# Patient Record
Sex: Female | Born: 1945 | Race: White | Hispanic: No | Smoking: Former smoker
Health system: Southern US, Community
[De-identification: ages and names within clinical notes are randomized; demographics above are authoritative.]

## PROBLEM LIST (undated history)

## (undated) DIAGNOSIS — C801 Malignant (primary) neoplasm, unspecified: Secondary | ICD-10-CM

## (undated) HISTORY — PX: TUBAL LIGATION: SHX77

---

## 2009-05-26 ENCOUNTER — Encounter: Admission: RE | Admit: 2009-05-26 | Discharge: 2009-05-26 | Payer: Self-pay | Admitting: Chiropractic Medicine

## 2013-12-01 ENCOUNTER — Other Ambulatory Visit: Payer: Self-pay | Admitting: Internal Medicine

## 2013-12-01 ENCOUNTER — Ambulatory Visit
Admission: RE | Admit: 2013-12-01 | Discharge: 2013-12-01 | Disposition: A | Discharge status: Home / Self Care | Payer: BC Managed Care – PPO | Source: Ambulatory Visit | Attending: Internal Medicine | Admitting: Internal Medicine

## 2013-12-01 DIAGNOSIS — R071 Chest pain on breathing: Secondary | ICD-10-CM

## 2013-12-25 ENCOUNTER — Other Ambulatory Visit: Payer: Self-pay | Admitting: Internal Medicine

## 2013-12-25 ENCOUNTER — Ambulatory Visit
Admission: RE | Admit: 2013-12-25 | Discharge: 2013-12-25 | Disposition: A | Discharge status: Home / Self Care | Payer: BC Managed Care – PPO | Source: Ambulatory Visit | Attending: Internal Medicine | Admitting: Internal Medicine

## 2013-12-25 DIAGNOSIS — R9389 Abnormal findings on diagnostic imaging of other specified body structures: Secondary | ICD-10-CM

## 2019-08-15 DIAGNOSIS — U071 COVID-19: Secondary | ICD-10-CM

## 2019-08-15 HISTORY — DX: COVID-19: U07.1

## 2019-12-12 ENCOUNTER — Other Ambulatory Visit: Payer: Self-pay | Admitting: Nurse Practitioner

## 2019-12-12 ENCOUNTER — Ambulatory Visit
Admission: RE | Admit: 2019-12-12 | Discharge: 2019-12-12 | Disposition: A | Discharge status: Home / Self Care | Payer: BC Managed Care – PPO | Source: Ambulatory Visit | Attending: Nurse Practitioner | Admitting: Nurse Practitioner

## 2019-12-12 DIAGNOSIS — R0602 Shortness of breath: Secondary | ICD-10-CM

## 2019-12-15 ENCOUNTER — Inpatient Hospital Stay (HOSPITAL_COMMUNITY)
Admission: EM | Admit: 2019-12-15 | Discharge: 2019-12-20 | DRG: 357 | Disposition: A | Discharge status: Home / Self Care | Payer: BC Managed Care – PPO | Attending: Internal Medicine | Admitting: Internal Medicine

## 2019-12-15 ENCOUNTER — Other Ambulatory Visit: Payer: Self-pay

## 2019-12-15 ENCOUNTER — Encounter (HOSPITAL_COMMUNITY): Payer: Self-pay

## 2019-12-15 DIAGNOSIS — Z87891 Personal history of nicotine dependence: Secondary | ICD-10-CM

## 2019-12-15 DIAGNOSIS — Z91011 Allergy to milk products: Secondary | ICD-10-CM

## 2019-12-15 DIAGNOSIS — Z8616 Personal history of COVID-19: Secondary | ICD-10-CM

## 2019-12-15 DIAGNOSIS — C799 Secondary malignant neoplasm of unspecified site: Secondary | ICD-10-CM | POA: Diagnosis present

## 2019-12-15 DIAGNOSIS — Z807 Family history of other malignant neoplasms of lymphoid, hematopoietic and related tissues: Secondary | ICD-10-CM

## 2019-12-15 DIAGNOSIS — D649 Anemia, unspecified: Secondary | ICD-10-CM | POA: Diagnosis present

## 2019-12-15 DIAGNOSIS — R634 Abnormal weight loss: Secondary | ICD-10-CM | POA: Diagnosis present

## 2019-12-15 DIAGNOSIS — I10 Essential (primary) hypertension: Secondary | ICD-10-CM | POA: Diagnosis present

## 2019-12-15 DIAGNOSIS — K7460 Unspecified cirrhosis of liver: Secondary | ICD-10-CM | POA: Diagnosis present

## 2019-12-15 DIAGNOSIS — R112 Nausea with vomiting, unspecified: Secondary | ICD-10-CM | POA: Diagnosis present

## 2019-12-15 DIAGNOSIS — R195 Other fecal abnormalities: Secondary | ICD-10-CM | POA: Insufficient documentation

## 2019-12-15 DIAGNOSIS — D62 Acute posthemorrhagic anemia: Secondary | ICD-10-CM | POA: Diagnosis present

## 2019-12-15 DIAGNOSIS — C787 Secondary malignant neoplasm of liver and intrahepatic bile duct: Secondary | ICD-10-CM | POA: Diagnosis present

## 2019-12-15 DIAGNOSIS — E669 Obesity, unspecified: Secondary | ICD-10-CM | POA: Diagnosis present

## 2019-12-15 DIAGNOSIS — Z888 Allergy status to other drugs, medicaments and biological substances status: Secondary | ICD-10-CM

## 2019-12-15 DIAGNOSIS — K449 Diaphragmatic hernia without obstruction or gangrene: Secondary | ICD-10-CM | POA: Diagnosis present

## 2019-12-15 DIAGNOSIS — D509 Iron deficiency anemia, unspecified: Secondary | ICD-10-CM | POA: Diagnosis present

## 2019-12-15 DIAGNOSIS — C182 Malignant neoplasm of ascending colon: Secondary | ICD-10-CM | POA: Diagnosis not present

## 2019-12-15 DIAGNOSIS — R600 Localized edema: Secondary | ICD-10-CM | POA: Diagnosis present

## 2019-12-15 DIAGNOSIS — R030 Elevated blood-pressure reading, without diagnosis of hypertension: Secondary | ICD-10-CM | POA: Diagnosis present

## 2019-12-15 DIAGNOSIS — Z419 Encounter for procedure for purposes other than remedying health state, unspecified: Secondary | ICD-10-CM

## 2019-12-15 DIAGNOSIS — K922 Gastrointestinal hemorrhage, unspecified: Secondary | ICD-10-CM | POA: Diagnosis present

## 2019-12-15 DIAGNOSIS — G4700 Insomnia, unspecified: Secondary | ICD-10-CM | POA: Diagnosis present

## 2019-12-15 DIAGNOSIS — Z91018 Allergy to other foods: Secondary | ICD-10-CM

## 2019-12-15 DIAGNOSIS — Z880 Allergy status to penicillin: Secondary | ICD-10-CM

## 2019-12-15 DIAGNOSIS — I4510 Unspecified right bundle-branch block: Secondary | ICD-10-CM | POA: Diagnosis present

## 2019-12-15 DIAGNOSIS — R9431 Abnormal electrocardiogram [ECG] [EKG]: Secondary | ICD-10-CM | POA: Diagnosis present

## 2019-12-15 DIAGNOSIS — Z79899 Other long term (current) drug therapy: Secondary | ICD-10-CM

## 2019-12-15 DIAGNOSIS — Z95828 Presence of other vascular implants and grafts: Secondary | ICD-10-CM

## 2019-12-15 DIAGNOSIS — Z7982 Long term (current) use of aspirin: Secondary | ICD-10-CM

## 2019-12-15 DIAGNOSIS — Z882 Allergy status to sulfonamides status: Secondary | ICD-10-CM

## 2019-12-15 DIAGNOSIS — K6389 Other specified diseases of intestine: Secondary | ICD-10-CM

## 2019-12-15 DIAGNOSIS — Z6827 Body mass index (BMI) 27.0-27.9, adult: Secondary | ICD-10-CM

## 2019-12-15 DIAGNOSIS — D72829 Elevated white blood cell count, unspecified: Secondary | ICD-10-CM | POA: Diagnosis present

## 2019-12-15 DIAGNOSIS — K648 Other hemorrhoids: Secondary | ICD-10-CM | POA: Diagnosis present

## 2019-12-15 DIAGNOSIS — E876 Hypokalemia: Secondary | ICD-10-CM | POA: Diagnosis present

## 2019-12-15 DIAGNOSIS — J8410 Pulmonary fibrosis, unspecified: Secondary | ICD-10-CM | POA: Diagnosis present

## 2019-12-15 DIAGNOSIS — Z20822 Contact with and (suspected) exposure to covid-19: Secondary | ICD-10-CM | POA: Diagnosis present

## 2019-12-15 LAB — LIPASE, BLOOD: Lipase: 40 U/L (ref 11–51)

## 2019-12-15 LAB — COMPREHENSIVE METABOLIC PANEL
ALT: 31 U/L (ref 0–44)
AST: 44 U/L — ABNORMAL HIGH (ref 15–41)
Albumin: 3 g/dL — ABNORMAL LOW (ref 3.5–5.0)
Alkaline Phosphatase: 192 U/L — ABNORMAL HIGH (ref 38–126)
Anion gap: 13 (ref 5–15)
BUN: 26 mg/dL — ABNORMAL HIGH (ref 8–23)
CO2: 18 mmol/L — ABNORMAL LOW (ref 22–32)
Calcium: 8.7 mg/dL — ABNORMAL LOW (ref 8.9–10.3)
Chloride: 103 mmol/L (ref 98–111)
Creatinine, Ser: 1.22 mg/dL — ABNORMAL HIGH (ref 0.44–1.00)
GFR calc Af Amer: 51 mL/min — ABNORMAL LOW (ref >60)
GFR calc non Af Amer: 44 mL/min — ABNORMAL LOW (ref >60)
Glucose, Bld: 102 mg/dL — ABNORMAL HIGH (ref 70–99)
Potassium: 3.3 mmol/L — ABNORMAL LOW (ref 3.5–5.1)
Sodium: 134 mmol/L — ABNORMAL LOW (ref 135–145)
Total Bilirubin: 0.5 mg/dL (ref 0.3–1.2)
Total Protein: 6.7 g/dL (ref 6.5–8.1)

## 2019-12-15 LAB — ABO/RH: ABO/RH(D): A POS

## 2019-12-15 LAB — CBC
HCT: 20.5 % — ABNORMAL LOW (ref 36.0–46.0)
Hemoglobin: 5.4 g/dL — CL (ref 12.0–15.0)
MCH: 17.5 pg — ABNORMAL LOW (ref 26.0–34.0)
MCHC: 26.3 g/dL — ABNORMAL LOW (ref 30.0–36.0)
MCV: 66.3 fL — ABNORMAL LOW (ref 80.0–100.0)
Platelets: 512 10*3/uL — ABNORMAL HIGH (ref 150–400)
RBC: 3.09 MIL/uL — ABNORMAL LOW (ref 3.87–5.11)
RDW: 19.8 % — ABNORMAL HIGH (ref 11.5–15.5)
WBC: 13.1 10*3/uL — ABNORMAL HIGH (ref 4.0–10.5)
nRBC: 0.2 % (ref 0.0–0.2)

## 2019-12-15 LAB — RESPIRATORY PANEL BY RT PCR (FLU A&B, COVID)
Influenza A by PCR: NEGATIVE
Influenza B by PCR: NEGATIVE
SARS Coronavirus 2 by RT PCR: NEGATIVE

## 2019-12-15 LAB — POC OCCULT BLOOD, ED: Fecal Occult Bld: POSITIVE — AB

## 2019-12-15 MED ORDER — PANTOPRAZOLE SODIUM 40 MG IV SOLR
40.0000 mg | Freq: Two times a day (BID) | INTRAVENOUS | Status: DC
  Administered 2019-12-15 – 2019-12-16 (×2): 40 mg via INTRAVENOUS
  Filled 2019-12-15 (×2): qty 40

## 2019-12-15 MED ORDER — ACETAMINOPHEN 650 MG RE SUPP: 650.0000 mg | Freq: Four times a day (QID) | RECTAL | Status: DC | PRN

## 2019-12-15 MED ORDER — ACETAMINOPHEN 325 MG PO TABS: 650.0000 mg | ORAL_TABLET | Freq: Four times a day (QID) | ORAL | Status: DC | PRN

## 2019-12-15 MED ORDER — SODIUM CHLORIDE 0.9% IV SOLUTION: Freq: Once | INTRAVENOUS | Status: AC

## 2019-12-15 MED ORDER — ONDANSETRON HCL 4 MG PO TABS: 4.0000 mg | ORAL_TABLET | Freq: Four times a day (QID) | ORAL | Status: DC | PRN

## 2019-12-15 MED ORDER — ONDANSETRON HCL 4 MG/2ML IJ SOLN
4.0000 mg | Freq: Four times a day (QID) | INTRAMUSCULAR | Status: DC | PRN
  Administered 2019-12-17 – 2019-12-18 (×2): 4 mg via INTRAVENOUS
  Filled 2019-12-15 (×2): qty 2

## 2019-12-15 NOTE — ED Provider Notes (Signed)
[facility] Provider Note   CSN: [id number] Arrival date & time: 12/15/19  1702     History Chief Complaint  Patient presents with  . Anemia  . Weakness    Stacy Burns is a 74 y.o. female.  74 yo F with a cc of weakness.  Patient has felt fatigue since she had the coronavirus in January.  Just had been getting better and so went to see family doctor early this week.  She had blood work drawn and then was called back today and told that she had a hemoglobin of 5.  The patient is unsure if she had any blood in her stools does not typically look at her stools.  Denies any other areas of bleeding.  Has had some right-sided abdominal pain that seems to come and go and is positional.  No pain currently.  The history is provided by the patient.  Anemia This is a new problem. The current episode started yesterday. The problem occurs constantly. The problem has not changed since onset.Pertinent negatives include no chest pain, no abdominal pain, no headaches and no shortness of breath. Nothing aggravates the symptoms. Nothing relieves the symptoms. She has tried nothing for the symptoms. The treatment provided no relief.  Weakness Associated symptoms: no abdominal pain, no arthralgias, no chest pain, no dizziness, no dysuria, no fever, no headaches, no myalgias, no nausea, no shortness of breath, no urgency and no vomiting        History reviewed. No pertinent past medical history.  Patient Active Problem List   Diagnosis Date Noted  . Symptomatic anemia 12/15/2019  . Hypokalemia 12/15/2019    Past Surgical History:  Procedure Laterality Date  . TUBAL LIGATION       OB History   No obstetric history on file.     No family history on file.  Social History   Tobacco Use  . Smoking status: Not on file  Substance Use Topics  . Alcohol use: Not on file  . Drug use: Not on file    Home Medications Prior to Admission medications     Medication Sig Start Date End Date Taking? Authorizing Provider  acetaminophen (TYLENOL) 325 MG tablet Take 325 mg by mouth every 6 (six) hours as needed for mild pain or headache.   Yes [provider]  aspirin 81 MG chewable tablet Chew 81 mg by mouth every 3 (three) days.   Yes [provider]  albuterol (VENTOLIN HFA) 108 (90 Base) MCG/ACT inhaler Inhale 1-2 puffs into the lungs See admin instructions. Inhale 1-2 puffs into the lungs every four to six hours as needed for shortness of breath or wheezing 12/12/19   [provider]  SPIRIVA RESPIMAT 2.5 MCG/ACT AERS Inhale 2 puffs into the lungs daily.  12/12/19   [provider]    Allergies    Milk-related compounds, Penicillins, Pineapple, Chocolate, Other, and Sulfa antibiotics  Review of Systems   Review of Systems  Constitutional: Negative for chills and fever.  HENT: Negative for congestion and rhinorrhea.   Eyes: Negative for redness and visual disturbance.  Respiratory: Negative for shortness of breath and wheezing.   Cardiovascular: Negative for chest pain and palpitations.  Gastrointestinal: Negative for abdominal pain, nausea and vomiting.  Genitourinary: Negative for dysuria and urgency.  Musculoskeletal: Negative for arthralgias and myalgias.  Skin: Negative for pallor and wound.  Neurological: Positive for weakness. Negative for dizziness and headaches.    Physical Exam  Updated Vital Signs BP (!) 165/66   Pulse 97   Temp 98.3 F (36.8 C) (Oral)   Resp (!) 22   Ht 5\' 7"  (1.702 m)   Wt 82.6 kg   SpO2 100%   BMI 28.51 kg/m   Physical Exam Vitals and nursing note reviewed.  Constitutional:      General: She is not in acute distress.    Appearance: She is well-developed. She is not diaphoretic.     Comments: Conjunctival pallor  HENT:     Head: Normocephalic and atraumatic.  Eyes:     Pupils: Pupils are equal, round, and reactive to light.  Cardiovascular:     Rate and  Rhythm: Regular rhythm. Tachycardia present.     Heart sounds: No murmur. No friction rub. No gallop.   Pulmonary:     Effort: Pulmonary effort is normal.     Breath sounds: No wheezing or rales.  Abdominal:     General: There is no distension.     Palpations: Abdomen is soft.     Tenderness: There is no abdominal tenderness.  Genitourinary:    Comments: Soft Radilla stool Musculoskeletal:        General: No tenderness.     Cervical back: Normal range of motion and neck supple.  Skin:    General: Skin is warm and dry.  Neurological:     Mental Status: She is alert and oriented to person, place, and time.  Psychiatric:        Behavior: Behavior normal.     ED Results / Procedures / Treatments   Labs (all labs ordered are listed, but only abnormal results are displayed) Labs Reviewed  COMPREHENSIVE METABOLIC PANEL - Abnormal; Notable for the following components:      Result Value   Sodium 134 (*)    Potassium 3.3 (*)    CO2 18 (*)    Glucose, Bld 102 (*)    BUN 26 (*)    Creatinine, Ser 1.22 (*)    Calcium 8.7 (*)    Albumin 3.0 (*)    AST 44 (*)    Alkaline Phosphatase 192 (*)    GFR calc non Af Amer 44 (*)    GFR calc Af Amer 51 (*)    All other components within normal limits  CBC - Abnormal; Notable for the following components:   WBC 13.1 (*)    RBC 3.09 (*)    Hemoglobin 5.4 (*)    HCT 20.5 (*)    MCV 66.3 (*)    MCH 17.5 (*)    MCHC 26.3 (*)    RDW 19.8 (*)    Platelets 512 (*)    All other components within normal limits  POC OCCULT BLOOD, ED - Abnormal; Notable for the following components:   Fecal Occult Bld POSITIVE (*)    All other components within normal limits  RESPIRATORY PANEL BY RT PCR (FLU A&B, COVID)  LIPASE, BLOOD  PROTIME-INR  APTT  PHOSPHORUS  MAGNESIUM  CBC WITH DIFFERENTIAL/PLATELET  COMPREHENSIVE METABOLIC PANEL  VITAMIN [id number]  FOLATE  IRON AND TIBC  FERRITIN  RETICULOCYTES  TYPE AND SCREEN  ABO/RH  PREPARE RBC (CROSSMATCH)      EKG None  Radiology No results found.  Procedures Procedures (including critical care time)  Medications Ordered in ED Medications  ondansetron (ZOFRAN) tablet 4 mg (has no administration in time range)    Or  ondansetron (ZOFRAN) injection 4 mg (has no administration in time range)  acetaminophen (TYLENOL) tablet 650 mg (has no administration in time range)    Or  acetaminophen (TYLENOL) suppository 650 mg (has no administration in time range)  pantoprazole (PROTONIX) injection 40 mg (40 mg Intravenous Given 12/15/19 2143)  0.9 %  sodium chloride infusion (Manually program via Guardrails IV Fluids) ( Intravenous New Bag/Given 12/15/19 2124)    ED Course  I have reviewed the triage vital signs and the nursing notes.  Pertinent labs & imaging results that were available during my care of the patient were reviewed by me and considered in my medical decision making (see chart for details).    MDM Rules/Calculators/A&P                      74 yo F with a chief complaint of generalized fatigue.  This been going on for months now.  Found to have a hemoglobin of 5 with an elevation.  Repeated here is 5.6.  Rectal exam without obvious bleeding.  Will send fecal occult.  Give 2 units of blood.  Discussed with hospitalist for admission.  CRITICAL CARE Performed by: Cecilio Asper   Total critical care time: 35 minutes  Critical care time was exclusive of separately billable procedures and treating other patients.  Critical care was necessary to treat or prevent imminent or life-threatening deterioration.  Critical care was time spent personally by me on the following activities: development of treatment plan with patient and/or surrogate as well as nursing, discussions with consultants, evaluation of patient's response to treatment, examination of patient, obtaining history from patient or surrogate, ordering and performing treatments and interventions, ordering and review of  laboratory studies, ordering and review of radiographic studies, pulse oximetry and re-evaluation of patient's condition.  The patients results and plan were reviewed and discussed.   Any x-rays performed were independently reviewed by myself.   Differential diagnosis were considered with the presenting HPI.  Medications  ondansetron (ZOFRAN) tablet 4 mg (has no administration in time range)    Or  ondansetron (ZOFRAN) injection 4 mg (has no administration in time range)  acetaminophen (TYLENOL) tablet 650 mg (has no administration in time range)    Or  acetaminophen (TYLENOL) suppository 650 mg (has no administration in time range)  pantoprazole (PROTONIX) injection 40 mg (40 mg Intravenous Given 12/15/19 2143)  0.9 %  sodium chloride infusion (Manually program via Guardrails IV Fluids) ( Intravenous New Bag/Given 12/15/19 2124)    Vitals:   12/15/19 2121 12/15/19 2122 12/15/19 2137 12/15/19 2200  BP: (!) 167/66 (!) 167/66 (!) 154/70 (!) 165/66  Pulse: 81 82 86 97  Resp: 17 20 16  (!) 22  Temp:  98.4 F (36.9 C) 98.3 F (36.8 C)   TempSrc:  Oral Oral   SpO2: 100%  100% 100%  Weight:      Height:        Final diagnoses:  Symptomatic anemia    Admission/ observation were discussed with the admitting physician, patient and/or family and they are comfortable with the plan.   Final Clinical Impression(s) / ED Diagnoses Final diagnoses:  Symptomatic anemia    Rx / DC Orders ED Discharge Orders    None       Deno Etienne, DO 12/15/19 2210

## 2019-12-15 NOTE — H&P (Addendum)
History and Physical    Stacy Burns [id number] DOB: 1946/04/07 DOA: 12/15/2019  PCP: Ferd Hibbs, NP   Patient coming from: Home.  I have personally briefly reviewed patient's old medical records in [place]  Chief Complaint: Low blood count.  HPI: Stacy Burns is a 74 y.o. female with medical history significant for hospitalization in January this year due to COVID-19 infection who went to see her PCP today due to persistent weakness and dyspnea since January following her coronavirus illness and is being referred from her PCP office to the ED due to having a hemoglobin of 5.  She has been having intermittent RUQ and epigastric pain, but denies nausea, hematemesis, diarrhea, constipation, melena or hematochezia.  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  She denied pica or somnolence.  She has actually been having insomnia after losing her husband to COVID-19 in February this year.  She denied fever, chills, sore throat, rhinorrhea, wheezing or hemoptysis.  She denied chest pain, palpitations, diaphoresis, PND, orthopnea or recent pitting edema of the lower extremities.  However, states that she frequently had bilateral lower extremity edema before losing weight post COVID-19 infection.  Denied dysuria, frequency or hematuria.  No polyuria, polydipsia, polyphagia or blurred vision.  ED Course: Initial vital signs were temperature 98.6 F, pulse 105, respiration 19, blood pressure 133/45 mmHg and O2 sat 100% on room air.  A 2 PRBC unit transfusion was started in the emergency department.  CBC shows white count of 13.1, hemoglobin 5.4 g/dL and platelets 512.  Lipase was 40.  Sodium is 134, potassium 3.3, chloride 103 and CO2 18 mmol/L.  Anion gap was 13.  Lucas 102, BUN 26 and creatinine 1.22 mg/dL.  Total protein 6.7, albumin 3.0 g/dL.  AST was 44, ALT 31 alkaline phosphatase 192.  Normal bilirubin.  Fecal occult blood was positive.  Her chest radiograph from 3 days ago  showed a calcified granuloma in the left lung base, but did not show any active cardiopulmonary disease.  Review of Systems: As per HPI otherwise all other systems reviewed and are negative.  History reviewed. No pertinent past medical history.  Past Surgical History:  Procedure Laterality Date  . TUBAL LIGATION      Social History  reports that she quit smoking about 35 years ago. Her smoking use included cigarettes. She quit after 25.00 years of use. She has never used smokeless tobacco. She reports previous alcohol use. No history on file for drug.  Allergies  Allergen Reactions  . Milk-Related Compounds Nausea Only and Other (See Comments)    Delayed reaction if too much consumed   . Penicillins Anaphylaxis and Other (See Comments)    Both parents had anaphylactic reactions to this  . Pineapple Swelling and Other (See Comments)    Tongue swells and causes acid bumps there, also  . Chocolate Other (See Comments)    Nasal congestion  . Other Itching, Rash and Other (See Comments)    "Most all antibiotics and OTC cold meds break me out" Allergic to ALL NUTS, also = Itching  . Sulfa Antibiotics Rash and Other (See Comments)    Severe rash    Family History  Problem Relation Age of Onset  . Multiple myeloma Mother   . Diabetes Mellitus II Mother   . Leukemia Father   . Muscular dystrophy Brother    Prior to Admission medications   Medication Sig Start Date End Date Taking? Authorizing Provider  acetaminophen (TYLENOL) 325 MG tablet Take 325 mg by mouth every 6 (six) hours as needed for mild pain or headache.   Yes [provider]  aspirin 81 MG chewable tablet Chew 81 mg by mouth every 3 (three) days.   Yes [provider]  albuterol (VENTOLIN HFA) 108 (90 Base) MCG/ACT inhaler Inhale 1-2 puffs into the lungs See admin instructions. Inhale 1-2 puffs into the lungs every four to six hours as needed for shortness of breath or wheezing 12/12/19   [provider]  SPIRIVA RESPIMAT 2.5 MCG/ACT AERS Inhale 2 puffs into the lungs daily.  12/12/19   [provider]   Physical Exam: Vitals:   12/15/19 2122 12/15/19 2137 12/15/19 2200 12/15/19 2230  BP: (!) 167/66 (!) 154/70 (!) 165/66 (!) 153/56  Pulse: 82 86 97 90  Resp: 20 16 (!) 22 18  Temp: 98.4 F (36.9 C) 98.3 F (36.8 C)    TempSrc: Oral Oral    SpO2:  100% 100% 100%  Weight:      Height:       Constitutional: NAD, calm, comfortable Eyes: PERRL, lids and conjunctivae are pale. ENMT: Mucous membranes are moist. Posterior pharynx clear of any exudate or lesions. Neck: normal, supple, no masses, no thyromegaly Respiratory: clear to auscultation bilaterally, no wheezing, no crackles. Normal respiratory effort. No accessory muscle use.  Cardiovascular: Regular rate and rhythm, no murmurs / rubs / gallops.  Bilateral lower extremity pitting edema. 2+ pedal pulses. No carotid bruits.  Abdomen: Obese, nondistended.  BS positive.  Soft, positive RUQ and epigastric tenderness, no masses palpated. No hepatosplenomegaly. Bowel sounds positive.  Musculoskeletal: Generalized weakness.  No clubbing / cyanosis. Good ROM, no contractures. Normal muscle tone.  Skin: no rashes, lesions, ulcers on limited dermatological examination. Neurologic: CN 2-12 grossly intact. Sensation intact, DTR normal. Strength 5/5 in all 4.  Psychiatric: Normal judgment and insight. Alert and oriented x 3. Normal mood.   Labs on Admission: I have personally reviewed following labs and imaging studies  CBC: Recent Labs  Lab 12/15/19 1804  WBC 13.1*  HGB 5.4*  HCT 20.5*  MCV 66.3*  PLT 512*    Basic Metabolic Panel: Recent Labs  Lab 12/15/19 1804  NA 134*  K 3.3*  CL 103  CO2 18*  GLUCOSE 102*  BUN 26*  CREATININE 1.22*  CALCIUM 8.7*    GFR: Estimated Creatinine Clearance: 44.7 mL/min (A) (by C-G formula based on SCr of 1.22 mg/dL (H)).  Liver Function Tests: Recent Labs  Lab  12/15/19 1804  AST 44*  ALT 31  ALKPHOS 192*  BILITOT 0.5  PROT 6.7  ALBUMIN 3.0*    Urine analysis: No results found for: COLORURINE, APPEARANCEUR, LABSPEC, PHURINE, GLUCOSEU, HGBUR, BILIRUBINUR, KETONESUR, PROTEINUR, UROBILINOGEN, NITRITE, LEUKOCYTESUR  Radiological Exams on Admission: No results found.  EKG: Independently reviewed. Vent. rate 89 BPM PR interval * ms QRS duration 138 ms QT/QTc 403/491 ms P-R-T axes 76 -49 68 Sinus rhythm RBBB and LAFB  Assessment/Plan Principal Problem:   Symptomatic microcytic anemia Observation/telemetry. Continue PRBC transfusion. Check post transfusion H&H. Start pantoprazole. Will need GI evaluation.  Active Problems:   Positive fecal occult blood test Pantoprazole 40 mg IVP every 12 hours. Consult GI in the morning.    Hypokalemia Follow-up posttransfusion potassium level. Check magnesium and phosphorus level.    Bilateral lower extremity edema Normal CXR. EKG shows RBBB. Check echocardiogram.    Abnormal EKG Check echocardiogram. Full cardiology evaluation as an outpatient.  Elevated blood pressure reading No history of hypertension. Continue trending BP. May need to begin antihypertensive therapy.    Leukocytosis No fever or signs of infection. Likely stress-induced. Follow-up WBC.    Thrombocytosis Secondary to anemia. Follow-up platelet level.   DVT prophylaxis: SCDs. Code Status:   Full code. Family Communication: Disposition Plan:   Patient is from:  Home  Anticipated DC to:  Home.  Anticipated DC date:  12/16/2019.  Anticipated DC barriers: Clinical improvement and GI evaluation. Consults called:   Admission status:  Observation/telemetry.  Severity of Illness:  Moderate severity.  Reubin Milan MD [facility]  How to contact the [facility] Attending or Consulting provider [place] or covering provider during after hours [place], for this patient?   1. Check the care team in [facility]  and look for a) attending/consulting [facility] provider listed and b) the [facility] team listed 2. Log into [email or web address] and use [facility]'s universal password to access. If you do not have the password, please contact the hospital operator. 3. Locate the [facility] provider you are looking for under [facility] and page to a number that you can be directly reached. 4. If you still have difficulty reaching the provider, please page the [facility] (Director on Call) for the Hospitalists listed on amion for assistance.  12/15/2019, 10:42 PM   This document was created using Dragon voice recognition software and may contain some unintended transcription errors.

## 2019-12-15 NOTE — ED Notes (Signed)
ED Provider at bedside. 

## 2019-12-15 NOTE — ED Notes (Signed)
Pt expresses no discomfort, no distress, feeling "very comfortable" at the moment. No needs expressed, VSS.

## 2019-12-15 NOTE — ED Triage Notes (Signed)
Pt reports generalized weakness since January. Pt had COVID in [place], was never hospitalized. Pt sent here due to hgb of 5 at PCP office. Pt denies any blood loss in stools. Intermittent abd pain.

## 2019-12-15 NOTE — ED Notes (Signed)
Pt ambulated to RR with no assistance from staff, NAD noted, gait steady

## 2019-12-16 ENCOUNTER — Encounter (HOSPITAL_COMMUNITY): Payer: Self-pay | Admitting: Internal Medicine

## 2019-12-16 ENCOUNTER — Observation Stay (HOSPITAL_BASED_OUTPATIENT_CLINIC_OR_DEPARTMENT_OTHER): Payer: BC Managed Care – PPO

## 2019-12-16 ENCOUNTER — Observation Stay (HOSPITAL_COMMUNITY): Payer: BC Managed Care – PPO

## 2019-12-16 DIAGNOSIS — D649 Anemia, unspecified: Secondary | ICD-10-CM

## 2019-12-16 DIAGNOSIS — R9431 Abnormal electrocardiogram [ECG] [EKG]: Secondary | ICD-10-CM

## 2019-12-16 DIAGNOSIS — R748 Abnormal levels of other serum enzymes: Secondary | ICD-10-CM | POA: Diagnosis not present

## 2019-12-16 DIAGNOSIS — D72829 Elevated white blood cell count, unspecified: Secondary | ICD-10-CM | POA: Diagnosis not present

## 2019-12-16 DIAGNOSIS — R195 Other fecal abnormalities: Secondary | ICD-10-CM | POA: Diagnosis not present

## 2019-12-16 LAB — COMPREHENSIVE METABOLIC PANEL
ALT: 28 U/L (ref 0–44)
AST: 41 U/L (ref 15–41)
Albumin: 2.6 g/dL — ABNORMAL LOW (ref 3.5–5.0)
Alkaline Phosphatase: 168 U/L — ABNORMAL HIGH (ref 38–126)
Anion gap: 13 (ref 5–15)
BUN: 20 mg/dL (ref 8–23)
CO2: 19 mmol/L — ABNORMAL LOW (ref 22–32)
Calcium: 8.3 mg/dL — ABNORMAL LOW (ref 8.9–10.3)
Chloride: 105 mmol/L (ref 98–111)
Creatinine, Ser: 0.97 mg/dL (ref 0.44–1.00)
GFR calc Af Amer: >60 mL/min (ref >60)
GFR calc non Af Amer: 58 mL/min — ABNORMAL LOW (ref >60)
Glucose, Bld: 93 mg/dL (ref 70–99)
Potassium: 3.3 mmol/L — ABNORMAL LOW (ref 3.5–5.1)
Sodium: 137 mmol/L (ref 135–145)
Total Bilirubin: 1.1 mg/dL (ref 0.3–1.2)
Total Protein: 5.7 g/dL — ABNORMAL LOW (ref 6.5–8.1)

## 2019-12-16 LAB — CBC WITH DIFFERENTIAL/PLATELET
Abs Immature Granulocytes: 0 10*3/uL (ref 0.00–0.07)
Basophils Absolute: 0.1 10*3/uL (ref 0.0–0.1)
Basophils Relative: 1 %
Eosinophils Absolute: 0.4 10*3/uL (ref 0.0–0.5)
Eosinophils Relative: 4 %
HCT: 24.7 % — ABNORMAL LOW (ref 36.0–46.0)
Hemoglobin: 7.3 g/dL — ABNORMAL LOW (ref 12.0–15.0)
Lymphocytes Relative: 13 %
Lymphs Abs: 1.5 10*3/uL (ref 0.7–4.0)
MCH: 21 pg — ABNORMAL LOW (ref 26.0–34.0)
MCHC: 29.6 g/dL — ABNORMAL LOW (ref 30.0–36.0)
MCV: 71.2 fL — ABNORMAL LOW (ref 80.0–100.0)
Monocytes Absolute: 0.3 10*3/uL (ref 0.1–1.0)
Monocytes Relative: 3 %
Neutro Abs: 8.8 10*3/uL — ABNORMAL HIGH (ref 1.7–7.7)
Neutrophils Relative %: 79 %
Platelets: 494 10*3/uL — ABNORMAL HIGH (ref 150–400)
RBC: 3.47 MIL/uL — ABNORMAL LOW (ref 3.87–5.11)
RDW: 23.9 % — ABNORMAL HIGH (ref 11.5–15.5)
WBC: 11.2 10*3/uL — ABNORMAL HIGH (ref 4.0–10.5)
nRBC: 0 /100 WBC
nRBC: 0.2 % (ref 0.0–0.2)

## 2019-12-16 LAB — HEMOGLOBIN AND HEMATOCRIT, BLOOD
HCT: 26.9 % — ABNORMAL LOW (ref 36.0–46.0)
Hemoglobin: 7.7 g/dL — ABNORMAL LOW (ref 12.0–15.0)

## 2019-12-16 LAB — ECHOCARDIOGRAM COMPLETE
Height: 67 in
Weight: 2912 oz

## 2019-12-16 MED ORDER — KCL-LACTATED RINGERS-D5W 20 MEQ/L IV SOLN
INTRAVENOUS | Status: AC
  Filled 2019-12-16 (×4): qty 1000

## 2019-12-16 MED ORDER — PEG-KCL-NACL-NASULF-NA ASC-C 100 G PO SOLR
1.0000 | Freq: Once | ORAL | Status: AC
  Administered 2019-12-17: 18:00:00 200 g via ORAL
  Filled 2019-12-16: qty 1

## 2019-12-16 MED ORDER — METOCLOPRAMIDE HCL 5 MG/ML IJ SOLN
10.0000 mg | Freq: Once | INTRAMUSCULAR | Status: AC
  Administered 2019-12-18: 10 mg via INTRAVENOUS
  Filled 2019-12-16: qty 2

## 2019-12-16 MED ORDER — MAGNESIUM SULFATE 2 GM/50ML IV SOLN
2.0000 g | Freq: Once | INTRAVENOUS | Status: AC
  Administered 2019-12-16: 2 g via INTRAVENOUS
  Filled 2019-12-16: qty 50

## 2019-12-16 MED ORDER — BISACODYL 5 MG PO TBEC
20.0000 mg | DELAYED_RELEASE_TABLET | Freq: Once | ORAL | Status: AC
  Administered 2019-12-16: 20 mg via ORAL

## 2019-12-16 MED ORDER — POTASSIUM CHLORIDE CRYS ER 20 MEQ PO TBCR
40.0000 meq | EXTENDED_RELEASE_TABLET | Freq: Two times a day (BID) | ORAL | Status: AC
  Administered 2019-12-16 (×2): 40 meq via ORAL
  Filled 2019-12-16 (×2): qty 2

## 2019-12-16 MED ORDER — PANTOPRAZOLE SODIUM 40 MG PO TBEC
40.0000 mg | DELAYED_RELEASE_TABLET | Freq: Every day | ORAL | Status: DC
  Administered 2019-12-17 – 2019-12-20 (×3): 40 mg via ORAL
  Filled 2019-12-16 (×2): qty 1

## 2019-12-16 MED ORDER — IOHEXOL 9 MG/ML PO SOLN
500.0000 mL | ORAL | Status: AC
  Administered 2019-12-16 (×2): 500 mL via ORAL

## 2019-12-16 MED ORDER — IOHEXOL 9 MG/ML PO SOLN
ORAL | Status: AC
  Filled 2019-12-16: qty 500

## 2019-12-16 MED ORDER — BISACODYL 5 MG PO TBEC
5.0000 mg | DELAYED_RELEASE_TABLET | Freq: Once | ORAL | Status: AC
  Administered 2019-12-16: 5 mg via ORAL
  Filled 2019-12-16: qty 1

## 2019-12-16 MED ORDER — METOCLOPRAMIDE HCL 5 MG/ML IJ SOLN
10.0000 mg | Freq: Once | INTRAMUSCULAR | Status: AC
  Administered 2019-12-17: 10 mg via INTRAVENOUS
  Filled 2019-12-16: qty 2

## 2019-12-16 NOTE — Progress Notes (Signed)
Echocardiogram 2D Echocardiogram has been performed.  Stacy Burns Aston 12/16/2019, 9:40 AM

## 2019-12-16 NOTE — ED Notes (Signed)
Lunch Tray Ordered @ 1033. 

## 2019-12-16 NOTE — ED Notes (Signed)
Breakfast Ordered 

## 2019-12-16 NOTE — Progress Notes (Signed)
PROGRESS NOTE  Stacy Burns Burns BPZ:[id number] DOB: 06/20/1946 DOA: 12/15/2019 PCP: Ferd Hibbs, NP  HPI/Recap of past 24 hours: Stacy Burns Burns is a 74 y.o. female with medical history significant for COVID-19 viral infection in January 2021 who presented with worsening dyspnea associated with generalized weakness.  With onset after her diagnosis of Covid-19 in January.  She went to see her PCP on the day of presentation where her hemoglobin was 5.5.  Associative symptoms include intermittent RUQ and epigastric pain.  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  She denied pica or somnolence.  She has actually been having insomnia after losing her husband to COVID-19 in February this year.  She denied fever, chills, sore throat, rhinorrhea, wheezing or hemoptysis.  She denied chest pain, palpitations, diaphoresis, PND, orthopnea or recent pitting edema of the lower extremities.   ED Course:  Hemoglobin 5.4 with positive FOBT.  GI following.  12/16/19: Seen and examined in the ED.  She states she feels better.  Denies hematochezia or melena.  Denies nausea or abdominal pain.    Assessment/Plan: Principal Problem:   Symptomatic anemia Active Problems:   Hypokalemia   Positive fecal occult blood test   Leukocytosis   Bilateral lower extremity edema   Abnormal EKG   Elevated blood pressure reading  Severe symptomatic anemia, rule out GI bleed and malignancy Presented with symptomatic anemia, unintentional weight loss, hemoglobin of 5.5 and positive FOBT GI has been consulted, defer to GI to obtain endoscopy We will obtain CT scan to rule out any intra-abdominal pathology that is visible on CT scan If endoscopy is unrevealing would obtain CT scan abdomen and pelvis with oral contrast Received 2 unit PRBCs, hemoglobin 7.3 posttransfusion, MCV 71 Iron studies ordered and pending. Transfuse hemoglobin less than 7.0 Repeat H&H this p.m. Monitor on telemetry  Dyspnea likely  symptomatic from severe anemia She had a 2D echo done which showed normal LVEF 60-65% Likely not cardiogenic  Leukocytosis likely reactive in the setting of severe anemia Presented with leukocytosis with WBC 13 K Leukocytosis is trending down  Hypokalemia Potassium 3.3 Repleted with oral potassium 40 mEq x 2 doses Added IV magnesium 2 g x1 Repeat BMP in the morning  Elevated alkaline phosphatase Presented with alkaline phosphatase 192, downtrending AST ALT T Bili normal If no significant improvement may consider imaging to evaluate the liver     Code Status: Full code  Family Communication: We will call family if okay with the patient  Disposition Plan: Patient from home.  Anticipate discharge to home in the next 48 to 72 hours or when GI signs off.  Barrier to discharge, ongoing work-up for severe anemia and weight loss.  Consultants:  GI  Procedures:  None  Antimicrobials:  None  DVT prophylaxis: SCD   Objective: Vitals:   12/16/19 1215 12/16/19 1230 12/16/19 1345 12/16/19 1514  BP:    (!) 159/74  Pulse: 77 77 85 89  Resp: _0 Temp:    98.5 F (36.9 C)  TempSrc:    Oral  SpO2: 99% 100% 100% 100%  Weight:      Height:        Intake/Output Summary (Last 24 hours) at 12/16/2019 1545 Last data filed at 12/16/2019 0350 Gross per 24 hour  Intake 630 ml  Output --  Net 630 ml   Filed Weights   12/15/19 1800  Weight: 82.6 kg    Exam:  . General: 74  y.o. year-old female well developed well nourished in no acute distress.  Alert and oriented x3. . Cardiovascular: Regular rate and rhythm with no rubs or gallops.  No thyromegaly or JVD noted.   Marland Kitchen Respiratory: Clear to auscultation with no wheezes or rales. Good inspiratory effort. . Abdomen: Soft nontender nondistended with normal bowel sounds x4 quadrants. . Musculoskeletal: No lower extremity edema. 2/4 pulses in all 4 extremities. Marland Kitchen Psychiatry: Mood is appropriate for condition and  setting   Data Reviewed: CBC: Recent Labs  Lab 12/15/19 1804 12/16/19 0559  WBC 13.1* 11.2*  NEUTROABS  --  8.8*  HGB 5.4* 7.3*  HCT 20.5* 24.7*  MCV 66.3* 71.2*  PLT 512* 076*   Basic Metabolic Panel: Recent Labs  Lab 12/15/19 1804 12/16/19 0559  NA 134* 137  K 3.3* 3.3*  CL 103 105  CO2 18* 19*  GLUCOSE 102* 93  BUN 26* 20  CREATININE 1.22* 0.97  CALCIUM 8.7* 8.3*   GFR: Estimated Creatinine Clearance: 56.2 mL/min (by C-G formula based on SCr of 0.97 mg/dL). Liver Function Tests: Recent Labs  Lab 12/15/19 1804 12/16/19 0559  AST 44* 41  ALT 31 28  ALKPHOS 192* 168*  BILITOT 0.5 1.1  PROT 6.7 5.7*  ALBUMIN 3.0* 2.6*   Recent Labs  Lab 12/15/19 1804  LIPASE 40   No results for input(s): AMMONIA in the last 168 hours. Coagulation Profile: No results for input(s): INR, PROTIME in the last 168 hours. Cardiac Enzymes: No results for input(s): CKTOTAL, CKMB, CKMBINDEX, TROPONINI in the last 168 hours. BNP (last 3 results) No results for input(s): PROBNP in the last 8760 hours. HbA1C: No results for input(s): HGBA1C in the last 72 hours. CBG: No results for input(s): GLUCAP in the last 168 hours. Lipid Profile: No results for input(s): CHOL, HDL, LDLCALC, TRIG, CHOLHDL, LDLDIRECT in the last 72 hours. Thyroid Function Tests: No results for input(s): TSH, T4TOTAL, FREET4, T3FREE, THYROIDAB in the last 72 hours. Anemia Panel: No results for input(s): VITAMINB12, FOLATE, FERRITIN, TIBC, IRON, RETICCTPCT in the last 72 hours. Urine analysis: No results found for: COLORURINE, APPEARANCEUR, LABSPEC, [place], GLUCOSEU, HGBUR, BILIRUBINUR, KETONESUR, PROTEINUR, UROBILINOGEN, NITRITE, LEUKOCYTESUR Sepsis Labs: _0 (procalcitonin:4,lacticidven:4)  ) Recent Results (from the past 240 hour(s))  Respiratory Panel by RT PCR (Flu A&B, Covid) - Nasopharyngeal Swab     Status: None   Collection Time: 12/15/19 10:07 PM   Specimen: Nasopharyngeal Swab  Result  Value Ref Range Status   SARS Coronavirus 2 by RT PCR NEGATIVE NEGATIVE Final    Comment: (NOTE) SARS-CoV-2 target nucleic acids are NOT DETECTED. The SARS-CoV-2 RNA is generally detectable in upper respiratoy specimens during the acute phase of infection. The lowest concentration of SARS-CoV-2 viral copies this assay can detect is 131 copies/mL. A negative result does not preclude SARS-Cov-2 infection and should not be used as the sole basis for treatment or other patient management decisions. A negative result may occur with  improper specimen collection/handling, submission of specimen other than nasopharyngeal swab, presence of viral mutation(s) within the areas targeted by this assay, and inadequate number of viral copies (<131 copies/mL). A negative result must be combined with clinical observations, patient history, and epidemiological information. The expected result is Negative. Fact Sheet for Patients:  PinkCheek.be Fact Sheet for Healthcare Providers:  GravelBags.it This test is not yet ap proved or cleared by the [place] FDA and  has been authorized for detection and/or diagnosis of SARS-CoV-2 by FDA under an Emergency Use Authorization (EUA).  This EUA will remain  in effect (meaning this test can be used) for the duration of the COVID-19 declaration under Section 564(b)(1) of the Act, 21 U.S.C. section 360bbb-3(b)(1), unless the authorization is terminated or revoked sooner.    Influenza A by PCR NEGATIVE NEGATIVE Final   Influenza B by PCR NEGATIVE NEGATIVE Final    Comment: (NOTE) The Xpert Xpress SARS-CoV-2/FLU/RSV assay is intended as an aid in  the diagnosis of influenza from Nasopharyngeal swab specimens and  should not be used as a sole basis for treatment. Nasal washings and  aspirates are unacceptable for Xpert Xpress SARS-CoV-2/FLU/RSV  testing. Fact Sheet for  Patients: PinkCheek.be Fact Sheet for Healthcare Providers: GravelBags.it This test is not yet approved or cleared by the [place] FDA and  has been authorized for detection and/or diagnosis of SARS-CoV-2 by  FDA under an Emergency Use Authorization (EUA). This EUA will remain  in effect (meaning this test can be used) for the duration of the  Covid-19 declaration under Section 564(b)(1) of the Act, 21  U.S.C. section 360bbb-3(b)(1), unless the authorization is  terminated or revoked. Performed at [facility], [place] [address] [place], [place] [address]       Studies: ECHOCARDIOGRAM COMPLETE  Result Date: 12/16/2019    ECHOCARDIOGRAM REPORT   Patient Name:   Stacy Burns Burns Date of Exam: 12/16/2019 Medical Rec #:  [id number]    Height:       67.0 in Accession #:    [id number]   Weight:       182.0 lb Date of Birth:  12-15-1945     BSA:          1.943 m Patient Age:    56 years     BP:           136/68 mmHg Patient Gender: F            HR:           79 bpm. Exam Location:  Inpatient Procedure: 2D Echo, Color Doppler and Cardiac Doppler Indications:    R06.9 DOE  History:        Patient has no prior history of Echocardiogram examinations.                 COVID+ in Jan 2021.  Sonographer:    Raquel Sarna Senior RDCS Referring Phys: [id number] [place]  Sonographer Comments: Suboptimal parasternal window. IMPRESSIONS  1. Left ventricular ejection fraction, by estimation, is 60 to 65%. The left ventricle has normal function. The left ventricle has no regional wall motion abnormalities. There is mild concentric left ventricular hypertrophy. Left ventricular diastolic parameters are indeterminate.  2. Right ventricular systolic function is normal. The right ventricular size is normal.  3. The mitral valve is normal in structure. Trivial mitral valve regurgitation. No evidence of mitral stenosis.  4. The aortic valve was not well visualized.  Aortic valve regurgitation is not visualized. No aortic stenosis is present.  5. The inferior vena cava is dilated in size with >50% respiratory variability, suggesting right atrial pressure of 8 mmHg. Comparison(s): No prior Echocardiogram. Conclusion(s)/Recommendation(s): Normal biventricular function without evidence of hemodynamically significant valvular heart disease. FINDINGS  Left Ventricle: Left ventricular ejection fraction, by estimation, is 60 to 65%. The left ventricle has normal function. The left ventricle has no regional wall motion abnormalities. The left ventricular internal cavity size was normal in size. There is  mild concentric left ventricular hypertrophy. Left ventricular diastolic parameters are  indeterminate. Right Ventricle: The right ventricular size is normal. No increase in right ventricular wall thickness. Right ventricular systolic function is normal. Left Atrium: Left atrial size was normal in size. Right Atrium: Right atrial size was normal in size. Pericardium: There is no evidence of pericardial effusion. Mitral Valve: The mitral valve is normal in structure. Moderate mitral annular calcification. Trivial mitral valve regurgitation. No evidence of mitral valve stenosis. Tricuspid Valve: The tricuspid valve is normal in structure. Tricuspid valve regurgitation is trivial. No evidence of tricuspid stenosis. Aortic Valve: The aortic valve was not well visualized. Aortic valve regurgitation is not visualized. No aortic stenosis is present. Pulmonic Valve: The pulmonic valve was not well visualized. Pulmonic valve regurgitation is not visualized. No evidence of pulmonic stenosis. Aorta: The aortic root was not well visualized and the ascending aorta was not well visualized. Pulmonary Artery: The pulmonary artery is not well seen. Venous: The inferior vena cava is dilated in size with greater than 50% respiratory variability, suggesting right atrial pressure of 8 mmHg. IAS/Shunts: The  atrial septum is grossly normal.  LEFT VENTRICLE PLAX 2D LVIDd:         4.37 cm  Diastology LVIDs:         2.83 cm  LV e' lateral:   9.03 cm/s LV PW:         1.32 cm  LV E/e' lateral: 10.5 LV IVS:        1.25 cm  LV e' medial:    6.96 cm/s LVOT diam:     2.10 cm  LV E/e' medial:  13.6 LV SV:         74 LV SV Index:   38 LVOT Area:     3.46 cm  RIGHT VENTRICLE RV S prime:     12.20 cm/s TAPSE (M-mode): 2.0 cm LEFT ATRIUM             Index       RIGHT ATRIUM           Index LA diam:        3.70 cm 1.90 cm/m  RA Area:     11.70 cm LA Vol (A2C):   61.9 ml 31.86 ml/m RA Volume:   24.00 ml  12.35 ml/m LA Vol (A4C):   51.2 ml 26.35 ml/m LA Biplane Vol: 55.9 ml 28.77 ml/m  AORTIC VALVE LVOT Vmax:   112.00 cm/s LVOT Vmean:  74.300 cm/s LVOT VTI:    0.214 m  AORTA Ao Root diam: 3.60 cm Ao Asc diam:  3.30 cm MITRAL VALVE MV Area (PHT): 2.76 cm    SHUNTS MV Decel Time: 275 msec    Systemic VTI:  0.21 m MV E velocity: 94.60 cm/s  Systemic Diam: 2.10 cm MV A velocity: 97.50 cm/s MV E/A ratio:  0.97 Buford Dresser MD Electronically signed by Buford Dresser MD Signature Date/Time: 12/16/2019/2:19:48 PM    Final     Scheduled Meds: . Derrill Memo ON 12/17/2019] bisacodyl  20 mg Oral Once  . bisacodyl  5 mg Oral Once  . [START ON 12/17/2019] metoCLOPramide (REGLAN) injection  10 mg Intravenous Once   Followed by  . [START ON 12/18/2019] metoCLOPramide (REGLAN) injection  10 mg Intravenous Once  . [START ON 12/17/2019] pantoprazole  40 mg Oral Daily  . [START ON 12/17/2019] peg 3350 powder  1 kit Oral Once  . potassium chloride  40 mEq Oral BID    Continuous Infusions: . dextrose 5% lactated ringers with KCl 20 mEq/L    .  magnesium sulfate bolus IVPB       LOS: 0 days     Kayleen Memos, MD [facility] Pager [phone]  If 7PM-7AM, please contact night-coverage [email or web address] Password [facility] 12/16/2019, 3:45 PM

## 2019-12-16 NOTE — Consult Note (Signed)
[facility] Consult: 1:43 PM 12/16/2019  LOS: 0 days    Referring Provider: Dr Aileen Fass  Primary Care Physician:  Ferd Hibbs, NP in [place] Primary Gastroenterologist:  unassigned    Reason for Consultation:  Microcytic anemia.  FOBT +   HPI: Stacy Burns is a 74 y.o. female.  PMH tubal ligation 30 plus years ago.  No regular medical care.  No prior EGD or colonoscopy. Covid 19 positive in 08/2019, not hospitalized but her husband spent 2 weeks at [facility], discharged and readmitted with CVAs and died in 10-16-19. Since Covid infection, having progressive DOE, fatigue.  Went to a new PMD on Friday, 5 days ago.  Labs drawn.  Call on Monday with results and Hgb ~ 5, sent to hospital for transfusion and is now getting admitted.   She does not observe BM's so color or evidence of blood not noted, but says more episodes of intermittent loose stools, not diarrhea, since January.  No abd pain, N/V, anorexia. Rare Tums for heartburn.  Eats red meat at least 1 x weekly.  No NSAIDs, takes 81 ASA every 3 days (her strategy).  No ETOH.  Possible anemia but no iron or blood transfusion as a young woman.    Hgb 5.4 >> 2 PRBC >> 7.3.  MCV 66.  FOBT + t bili 1.1.  Alk phos 192.  AST/ALT 44/31.   Covid 19 negative 2D echo with LVEF 60 to 65%.  Normal right ventricular systolic function.  No significant valvular disease/regurg.      Family history Mother died with multiple myeloma.  Father died with leukemia.  Social history No EtOH.  Widowed as of 10-16-2019.  She has 2 children, one of her sons has been staying with her at her home since the death of her husband.     Past Medical History:  Diagnosis Date  . COVID-19 virus infection 08/2019    Past Surgical History:  Procedure Laterality Date    . TUBAL LIGATION      Prior to Admission medications   Medication Sig Start Date End Date Taking? Authorizing Provider  acetaminophen (TYLENOL) 325 MG tablet Take 325 mg by mouth every 6 (six) hours as needed for mild pain or headache.   Yes [provider]  aspirin 81 MG chewable tablet Chew 81 mg by mouth every 3 (three) days.   Yes [provider]  albuterol (VENTOLIN HFA) 108 (90 Base) MCG/ACT inhaler Inhale 1-2 puffs into the lungs See admin instructions. Inhale 1-2 puffs into the lungs every four to six hours as needed for shortness of breath or wheezing 12/12/19   [provider]  SPIRIVA RESPIMAT 2.5 MCG/ACT AERS Inhale 2 puffs into the lungs daily.  12/12/19   [provider]    Scheduled Meds: . pantoprazole (PROTONIX) IV  40 mg Intravenous Q12H  . potassium chloride  40 mEq Oral BID   Infusions: . dextrose 5% lactated ringers with KCl 20 mEq/L    . magnesium sulfate bolus IVPB  PRN Meds: acetaminophen **OR** acetaminophen, ondansetron **OR** ondansetron (ZOFRAN) IV   Allergies as of 12/15/2019 - Review Complete 12/15/2019  Allergen Reaction Noted  . Milk-related compounds Nausea Only and Other (See Comments) 12/15/2019  . Penicillins Anaphylaxis and Other (See Comments) 12/15/2019  . Pineapple Swelling and Other (See Comments) 12/15/2019  . Chocolate Other (See Comments) 12/15/2019  . Other Itching, Rash, and Other (See Comments) 12/15/2019  . Sulfa antibiotics Rash and Other (See Comments) 12/15/2019    Family History  Problem Relation Age of Onset  . Multiple myeloma Mother   . Diabetes Mellitus II Mother   . Leukemia Father   . Muscular dystrophy Brother     Social History   Socioeconomic History  . Marital status: Married    Spouse name: Not on file  . Number of children: Not on file  . Years of education: Not on file  . Highest education level: Not on file  Occupational History  . Not on file  Tobacco Use  .  Smoking status: Former Smoker    Years: 25.00    Types: Cigarettes    Quit date: 08/14/1984    Years since quitting: 35.3  . Smokeless tobacco: Never Used  Substance and Sexual Activity  . Alcohol use: Not Currently  . Drug use: Not on file  . Sexual activity: Not on file  Other Topics Concern  . Not on file  Social History Narrative  . Not on file   Social Determinants of Health   Financial Resource Strain:   . Difficulty of Paying Living Expenses:   Food Insecurity:   . Worried About Charity fundraiser in the Last Year:   . Arboriculturist in the Last Year:   Transportation Needs:   . Film/video editor (Medical):   Marland Kitchen Lack of Transportation (Non-Medical):   Physical Activity:   . Days of Exercise per Week:   . Minutes of Exercise per Session:   Stress:   . Feeling of Stress :   Social Connections:   . Frequency of Communication with Friends and Family:   . Frequency of Social Gatherings with Friends and Family:   . Attends Religious Services:   . Active Member of Clubs or Organizations:   . Attends Archivist Meetings:   Marland Kitchen Marital Status:   Intimate Partner Violence:   . Fear of Current or Ex-Partner:   . Emotionally Abused:   Marland Kitchen Physically Abused:   . Sexually Abused:     REVIEW OF SYSTEMS: Constitutional: Weakness, fatigue ENT:  No nasal or oral bleeding Pulm: Shortness of breath, improved since transfusion CV:  No palpitations, no LE edema.  No angina GU:  No hematuria, no frequency.  No dysuria GI: See HPI. Heme: See HPI. Transfusions: Received 2 PRBCs this morning. Neuro:  No headaches, no peripheral tingling or numbness.  No syncope, no seizures. Derm:  No itching, no rash or sores.  Endocrine:  No sweats or chills.  No polyuria or dysuria Immunization: Has not yet been vaccinated for COVID-19. Travel:  None beyond local counties in last few months.    PHYSICAL EXAM: Vital signs in last 24 hours: Vitals:   12/16/19 1215 12/16/19 1230    BP:    Pulse: 77 77  Resp: 20 18  Temp:    SpO2: 99% 100%   Wt Readings from Last 3 Encounters:  12/15/19 82.6 kg    General: Pleasant, nonill appearing.  Looks tired.  Comfortable. Head: No  facial asymmetry or swelling.  No signs of head trauma Eyes: No conjunctival pallor.  No scleral icterus.  EOMI Ears: No hearing deficit. Nose: No congestion or discharge Mouth: Edentulous, not currently wearing her dentures.  Mucosa is pink, moist, clear.  Tongue is midline. Neck: No JVD, no masses, no thyromegaly Lungs: Clear bilaterally without cough or dyspnea. Heart: RRR.  No MRG.  S1, S2 present Abdomen: Soft, not tender, not distended.  Active bowel sounds.  No HSM, bruits, masses, hernias..   Rectal: Did not repeat exam performed by ED staff where the stool was Golz but FOBT positive Musc/Skeltl: No joint redness, swelling or gross deformity Extremities: No CCE Neurologic: Good historian.  Alert.  Oriented x3.  Moves all 4 limbs.  No tremor, no gross deficits. Skin: No telangiectasia, no rash, no sores Tattoos: None observed Nodes: No cervical adenopathy Psych: Pleasant, calm, cooperative.  Fluid speech  Intake/Output from previous day: 05/03 0701 - 05/04 0700 In: 630 [Blood:630] Out: -  Intake/Output this shift: No intake/output data recorded.  LAB RESULTS: Recent Labs    12/15/19 1804 12/16/19 0559  WBC 13.1* 11.2*  HGB 5.4* 7.3*  HCT 20.5* 24.7*  PLT 512* 494*   BMET Lab Results  Component Value Date   NA 137 12/16/2019   NA 134 (L) 12/15/2019   K 3.3 (L) 12/16/2019   K 3.3 (L) 12/15/2019   CL 105 12/16/2019   CL 103 12/15/2019   CO2 19 (L) 12/16/2019   CO2 18 (L) 12/15/2019   GLUCOSE 93 12/16/2019   GLUCOSE 102 (H) 12/15/2019   BUN 20 12/16/2019   BUN 26 (H) 12/15/2019   CREATININE 0.97 12/16/2019   CREATININE 1.22 (H) 12/15/2019   CALCIUM 8.3 (L) 12/16/2019   CALCIUM 8.7 (L) 12/15/2019   LFT Recent Labs    12/15/19 1804 12/16/19 0559  PROT 6.7  5.7*  ALBUMIN 3.0* 2.6*  AST 44* 41  ALT 31 28  ALKPHOS 192* 168*  BILITOT 0.5 1.1   PT/INR No results found for: INR, PROTIME      Component Value Date/Time   LIPASE 40 12/15/2019 1804     RADIOLOGY STUDIES: No results found.    IMPRESSION:   *    Microcytic anemia, symptomatic w fatigue/DOE/weakness.  FOBT+ stool but no history of overt GI bleeding and no significant GI symptomatology. Good response to transfusion, Hgb 5.4 >> 2 PRBCs >> 7.3. Anemia/iron studies not obtained but with microcytosis this is certainly iron deficiency.  *    Elevated alk phos and elevated but now normal AST, otherwise normal elevated LFTs.  *    COVID-19 positive 08/2019.  Did not require hospitalization.  Unfortunately her husband had active Covid infection and ultimately passed away in 09-27-2019. COVID-19 negative currently  *   Hypokalemia 3.3.  Supplements ordered.   PLAN:     *   Needs colonoscopy and probably upper endoscopy as well.  ? do we keep her as an inpt and perform these studies on 5/6, given that the endoscopy scheduled for 5/5 is already fully booked?  The other strategy would be to let her discharge home and set her up for outpatient studies.  *   Does she need CT abdomen pelvis for eval of elevated LFTs?  The greatest concern is that her anemia, FOBT positivity represents a malignancy and alk phos elevation represents liver mets.  *   Until we decide how we will approach the endoscopic studies, keep patient on clear liquids  *  Anemia panel collected pretransfusion, results pdg  *   Discontinued the Protonix 40 IV bid.  We will add empiric Protonix 40 p.o. daily but not absolutely clear she needs this.    Azucena Freed  12/16/2019, 1:43 PM Phone [phone]

## 2019-12-16 NOTE — ED Notes (Signed)
Pt resting comfortably, respirations even & unlabored. VSS, no needs expressed.

## 2019-12-16 NOTE — ED Notes (Signed)
Pt expresses no discomfort or distress starting 2nd unit of RBC. VSS, pt resting comfortably. Blood rate increased to 135mL/hr

## 2019-12-17 ENCOUNTER — Encounter (HOSPITAL_COMMUNITY): Payer: Self-pay | Admitting: Internal Medicine

## 2019-12-17 DIAGNOSIS — R935 Abnormal findings on diagnostic imaging of other abdominal regions, including retroperitoneum: Secondary | ICD-10-CM

## 2019-12-17 DIAGNOSIS — E876 Hypokalemia: Secondary | ICD-10-CM | POA: Diagnosis present

## 2019-12-17 DIAGNOSIS — D72829 Elevated white blood cell count, unspecified: Secondary | ICD-10-CM | POA: Diagnosis present

## 2019-12-17 DIAGNOSIS — D490 Neoplasm of unspecified behavior of digestive system: Secondary | ICD-10-CM | POA: Diagnosis not present

## 2019-12-17 DIAGNOSIS — K7460 Unspecified cirrhosis of liver: Secondary | ICD-10-CM

## 2019-12-17 DIAGNOSIS — D62 Acute posthemorrhagic anemia: Secondary | ICD-10-CM | POA: Diagnosis present

## 2019-12-17 DIAGNOSIS — K449 Diaphragmatic hernia without obstruction or gangrene: Secondary | ICD-10-CM | POA: Diagnosis present

## 2019-12-17 DIAGNOSIS — G4700 Insomnia, unspecified: Secondary | ICD-10-CM | POA: Diagnosis present

## 2019-12-17 DIAGNOSIS — R634 Abnormal weight loss: Secondary | ICD-10-CM | POA: Diagnosis present

## 2019-12-17 DIAGNOSIS — K922 Gastrointestinal hemorrhage, unspecified: Secondary | ICD-10-CM | POA: Diagnosis present

## 2019-12-17 DIAGNOSIS — C799 Secondary malignant neoplasm of unspecified site: Secondary | ICD-10-CM | POA: Diagnosis present

## 2019-12-17 DIAGNOSIS — R600 Localized edema: Secondary | ICD-10-CM | POA: Diagnosis present

## 2019-12-17 DIAGNOSIS — E669 Obesity, unspecified: Secondary | ICD-10-CM | POA: Diagnosis present

## 2019-12-17 DIAGNOSIS — D649 Anemia, unspecified: Secondary | ICD-10-CM | POA: Diagnosis present

## 2019-12-17 DIAGNOSIS — R112 Nausea with vomiting, unspecified: Secondary | ICD-10-CM | POA: Diagnosis present

## 2019-12-17 DIAGNOSIS — J8410 Pulmonary fibrosis, unspecified: Secondary | ICD-10-CM | POA: Diagnosis present

## 2019-12-17 DIAGNOSIS — I10 Essential (primary) hypertension: Secondary | ICD-10-CM | POA: Diagnosis present

## 2019-12-17 DIAGNOSIS — C787 Secondary malignant neoplasm of liver and intrahepatic bile duct: Secondary | ICD-10-CM | POA: Diagnosis present

## 2019-12-17 DIAGNOSIS — Z91011 Allergy to milk products: Secondary | ICD-10-CM | POA: Diagnosis not present

## 2019-12-17 DIAGNOSIS — C182 Malignant neoplasm of ascending colon: Secondary | ICD-10-CM | POA: Diagnosis present

## 2019-12-17 DIAGNOSIS — Z7982 Long term (current) use of aspirin: Secondary | ICD-10-CM | POA: Diagnosis not present

## 2019-12-17 DIAGNOSIS — Z880 Allergy status to penicillin: Secondary | ICD-10-CM | POA: Diagnosis not present

## 2019-12-17 DIAGNOSIS — Z888 Allergy status to other drugs, medicaments and biological substances status: Secondary | ICD-10-CM | POA: Diagnosis not present

## 2019-12-17 DIAGNOSIS — K5669 Other partial intestinal obstruction: Secondary | ICD-10-CM | POA: Diagnosis not present

## 2019-12-17 DIAGNOSIS — Z20822 Contact with and (suspected) exposure to covid-19: Secondary | ICD-10-CM | POA: Diagnosis present

## 2019-12-17 DIAGNOSIS — K648 Other hemorrhoids: Secondary | ICD-10-CM | POA: Diagnosis present

## 2019-12-17 DIAGNOSIS — I4510 Unspecified right bundle-branch block: Secondary | ICD-10-CM | POA: Diagnosis present

## 2019-12-17 DIAGNOSIS — D509 Iron deficiency anemia, unspecified: Secondary | ICD-10-CM | POA: Diagnosis present

## 2019-12-17 DIAGNOSIS — Z79899 Other long term (current) drug therapy: Secondary | ICD-10-CM | POA: Diagnosis not present

## 2019-12-17 DIAGNOSIS — Z882 Allergy status to sulfonamides status: Secondary | ICD-10-CM | POA: Diagnosis not present

## 2019-12-17 HISTORY — DX: Anemia, unspecified: D64.9

## 2019-12-17 LAB — TYPE AND SCREEN
ABO/RH(D): A POS
Antibody Screen: NEGATIVE
Unit division: 0
Unit division: 0

## 2019-12-17 LAB — BPAM RBC
Blood Product Expiration Date: 202105292359
Blood Product Expiration Date: 202105292359
ISSUE DATE / TIME: 202105032118
ISSUE DATE / TIME: 202105040042
Unit Type and Rh: 6200
Unit Type and Rh: 6200

## 2019-12-17 LAB — CBC
HCT: 26.6 % — ABNORMAL LOW (ref 36.0–46.0)
Hemoglobin: 7.6 g/dL — ABNORMAL LOW (ref 12.0–15.0)
MCH: 20.6 pg — ABNORMAL LOW (ref 26.0–34.0)
MCHC: 28.6 g/dL — ABNORMAL LOW (ref 30.0–36.0)
MCV: 72.1 fL — ABNORMAL LOW (ref 80.0–100.0)
Platelets: 510 10*3/uL — ABNORMAL HIGH (ref 150–400)
RBC: 3.69 MIL/uL — ABNORMAL LOW (ref 3.87–5.11)
RDW: 24 % — ABNORMAL HIGH (ref 11.5–15.5)
WBC: 11.8 10*3/uL — ABNORMAL HIGH (ref 4.0–10.5)
nRBC: 0 % (ref 0.0–0.2)

## 2019-12-17 LAB — BASIC METABOLIC PANEL
Anion gap: 8 (ref 5–15)
BUN: 12 mg/dL (ref 8–23)
CO2: 21 mmol/L — ABNORMAL LOW (ref 22–32)
Calcium: 8.5 mg/dL — ABNORMAL LOW (ref 8.9–10.3)
Chloride: 107 mmol/L (ref 98–111)
Creatinine, Ser: 1.01 mg/dL — ABNORMAL HIGH (ref 0.44–1.00)
GFR calc Af Amer: >60 mL/min (ref >60)
GFR calc non Af Amer: 55 mL/min — ABNORMAL LOW (ref >60)
Glucose, Bld: 115 mg/dL — ABNORMAL HIGH (ref 70–99)
Potassium: 4.2 mmol/L (ref 3.5–5.1)
Sodium: 136 mmol/L (ref 135–145)

## 2019-12-17 MED ORDER — ALPRAZOLAM 0.25 MG PO TABS
0.2500 mg | ORAL_TABLET | Freq: Two times a day (BID) | ORAL | Status: DC | PRN
  Filled 2019-12-17: qty 1

## 2019-12-17 MED ORDER — BISACODYL 5 MG PO TBEC
20.0000 mg | DELAYED_RELEASE_TABLET | Freq: Once | ORAL | Status: AC
  Administered 2019-12-17: 20 mg via ORAL
  Filled 2019-12-17: qty 4

## 2019-12-17 NOTE — Progress Notes (Addendum)
Daily Rounding Note  12/17/2019, 10:03 AM  LOS: 0 days   SUBJECTIVE:   Chief complaint: Symptomatic anemia    Feeling a little better after PRBCs.  2 [place] stools so far.  No abd pain.  OBJECTIVE:         Vital signs in last 24 hours:    Temp:  [98.2 F (36.8 C)-98.5 F (36.9 C)] 98.4 F (36.9 C) (05/05 0550) Pulse Rate:  [76-89] 77 (05/05 0550) Resp:  [16-20] 16 (05/05 0550) BP: (113-162)/(65-74) 153/65 (05/05 0550) SpO2:  [95 %-100 %] 95 % (05/05 0550)   Filed Weights   12/15/19 1800  Weight: 82.6 kg   General: looks tired, not acutely ill   Heart: RRR Chest: clear , no dyspnea or cough Abdomen: soft, NT, ND.  Active BS  Extremities: no CCE Neuro/Psych:  Oriented x 3.  No weakness, tremors or deficits.    Intake/Output from previous day: 05/04 0701 - 05/05 0700 In: 781.7 [P.O.:240; I.V.:541.7] Out: -   Intake/Output this shift: Total I/O In: 460 [P.O.:460] Out: -   Lab Results: Recent Labs    12/15/19 1804 12/15/19 1804 12/16/19 0559 12/16/19 1629 12/17/19 0748  WBC 13.1*  --  11.2*  --  11.8*  HGB 5.4*   < > 7.3* 7.7* 7.6*  HCT 20.5*   < > 24.7* 26.9* 26.6*  PLT 512*  --  494*  --  510*   < > = values in this interval not displayed.   BMET Recent Labs    12/15/19 1804 12/16/19 0559 12/17/19 0748  NA 134* 137 136  K 3.3* 3.3* 4.2  CL 103 105 107  CO2 18* 19* 21*  GLUCOSE 102* 93 115*  BUN 26* 20 12  CREATININE 1.22* 0.97 1.01*  CALCIUM 8.7* 8.3* 8.5*   LFT Recent Labs    12/15/19 1804 12/16/19 0559  PROT 6.7 5.7*  ALBUMIN 3.0* 2.6*  AST 44* 41  ALT 31 28  ALKPHOS 192* 168*  BILITOT 0.5 1.1   PT/INR No results for input(s): LABPROT, INR in the last 72 hours. Hepatitis Panel No results for input(s): HEPBSAG, HCVAB, HEPAIGM, HEPBIGM in the last 72 hours.  Studies/Results: CT ABDOMEN PELVIS WO CONTRAST  Result Date: 12/16/2019 CLINICAL DATA:  74 year old female with  anemia. Weight loss. Evaluate for malignancy. EXAM: CT ABDOMEN AND PELVIS WITHOUT CONTRAST TECHNIQUE: Multidetector CT imaging of the abdomen and pelvis was performed following the standard protocol without IV contrast. COMPARISON:  None. FINDINGS: Evaluation of this exam is limited in the absence of intravenous contrast. Lower chest: There is a 5 mm left lower lobe calcified granuloma. The visualized lung bases are otherwise clear. No intra-abdominal free air or free fluid. Hepatobiliary: Cirrhosis. Multiple hepatic hypodense lesions measuring up to approximately 3.5 cm consistent with metastatic disease. No intrahepatic biliary ductal dilatation. The gallbladder is unremarkable. Pancreas: Unremarkable. No pancreatic ductal dilatation or surrounding inflammatory changes. Spleen: Normal in size without focal abnormality. Adrenals/Urinary Tract: The adrenal glands are unremarkable. The kidneys, and visualized ureters appear unremarkable. The urinary bladder is collapsed. Stomach/Bowel: There is an area of thickening of the proximal colon involving the cecum and ascending colon with probable degree of stricture just above the ileocecal valve most consistent with malignancy. There is stranding and haziness of the adjacent fat with several nodular density adjacent to the cecum consistent with metastatic disease. The largest pericecal nodule or confluent of nodule medial to the cecum measures 20  x 17 mm (49/3). There is a small hiatal hernia. There is no bowel obstruction. The appendix is normal. Vascular/Lymphatic: Advanced aortoiliac atherosclerotic disease. The IVC is unremarkable. No portal venous gas. There is no adenopathy. Reproductive: The uterus and ovaries are grossly unremarkable. Other: None Musculoskeletal: None mild degenerative changes. No acute osseous pathology. IMPRESSION: 1. Focal area of wall thickening and nodularity involving the cecum and ascending colon with probable degree of stricture just above  the ileocecal valve most consistent with malignancy. Further evaluation with colonoscopy is recommended. 2. Cirrhosis with innumerable hepatic metastatic disease. 3. No bowel obstruction. Normal appendix. 4. Aortic Atherosclerosis (ICD10-I70.0). Electronically Signed   By: Anner Crete M.D.   On: 12/16/2019 22:23   ECHOCARDIOGRAM COMPLETE  Result Date: 12/16/2019 .IMPRESSIONS  1. Left ventricular ejection fraction, by estimation, is 60 to 65%. The left ventricle has normal function. The left ventricle has no regional wall motion abnormalities. There is mild concentric left ventricular hypertrophy. Left ventricular diastolic parameters are indeterminate.  2. Right ventricular systolic function is normal. The right ventricular size is normal.  3. The mitral valve is normal in structure. Trivial mitral valve regurgitation. No evidence of mitral stenosis.  4. The aortic valve was not well visualized. Aortic valve regurgitation is not visualized. No aortic stenosis is present.  5. The inferior vena cava is dilated in size with >50% respiratory variability, suggesting right atrial pressure of 8 mmHg. Comparison(s): No prior Echocardiogram. Conclusion(s)/Recommendation(s): Normal biventricular function without evidence of hemodynamically significant valvular heart disease.    Electronically signed by Buford Dresser MD Signature Date/Time: 12/16/2019/2:19:48 PM    Final     ASSESMENT:    *    Microcytic anemia, symptomatic w fatigue/DOE/weakness.  FOBT+ stool but no history of overt GI bleeding and no significant GI symptomatology. Good response to transfusion, Hgb 5.4 >> 2 PRBCs >> 7.3. Anemia/iron studies not obtained but with microcytosis this is certainly iron deficiency. 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   Hgb 5.4 >> 2 PRBCs >> 7.6.  Anemia panel pndg.    *    Elevated alk phos and elevated but now normal AST, otherwise normal elevated  LFTs. Cirrhosis and liver mets per CT.  Platelets 510.    *    COVID-19 positive 08/2019.  Did not require hospitalization.  COVID-19 negative currently.    *   Hypokalemia, corrected/resolved.     PLAN   *   Obtain pt/inr given new dx cirrhosis.   *   Discussed CT findings w pt.  She is understandbly emotional but understands and taking things a step at a time.  Explained need for EGD and colonscopy to confirm cancer dx and to eval for portal htn and varices.  She is agreeable.    +  Bowel prep split dose moviprep and dulcolax this PM.  Clears.    *   Might need prn low dose ativan to help with nerves and sleep given her new dx.      Azucena Freed  12/17/2019, 10:03 AM Phone [phone]

## 2019-12-17 NOTE — H&P (View-Only) (Signed)
Daily Rounding Note  12/17/2019, 10:03 AM  LOS: 0 days   SUBJECTIVE:   Chief complaint: Symptomatic anemia    Feeling a little better after PRBCs.  2 [place] stools so far.  No abd pain.  OBJECTIVE:         Vital signs in last 24 hours:    Temp:  [98.2 F (36.8 C)-98.5 F (36.9 C)] 98.4 F (36.9 C) (05/05 0550) Pulse Rate:  [76-89] 77 (05/05 0550) Resp:  [16-20] 16 (05/05 0550) BP: (113-162)/(65-74) 153/65 (05/05 0550) SpO2:  [95 %-100 %] 95 % (05/05 0550)   Filed Weights   12/15/19 1800  Weight: 82.6 kg   General: looks tired, not acutely ill   Heart: RRR Chest: clear , no dyspnea or cough Abdomen: soft, NT, ND.  Active BS  Extremities: no CCE Neuro/Psych:  Oriented x 3.  No weakness, tremors or deficits.    Intake/Output from previous day: 05/04 0701 - 05/05 0700 In: 781.7 [P.O.:240; I.V.:541.7] Out: -   Intake/Output this shift: Total I/O In: 460 [P.O.:460] Out: -   Lab Results: Recent Labs    12/15/19 1804 12/15/19 1804 12/16/19 0559 12/16/19 1629 12/17/19 0748  WBC 13.1*  --  11.2*  --  11.8*  HGB 5.4*   < > 7.3* 7.7* 7.6*  HCT 20.5*   < > 24.7* 26.9* 26.6*  PLT 512*  --  494*  --  510*   < > = values in this interval not displayed.   BMET Recent Labs    12/15/19 1804 12/16/19 0559 12/17/19 0748  NA 134* 137 136  K 3.3* 3.3* 4.2  CL 103 105 107  CO2 18* 19* 21*  GLUCOSE 102* 93 115*  BUN 26* 20 12  CREATININE 1.22* 0.97 1.01*  CALCIUM 8.7* 8.3* 8.5*   LFT Recent Labs    12/15/19 1804 12/16/19 0559  PROT 6.7 5.7*  ALBUMIN 3.0* 2.6*  AST 44* 41  ALT 31 28  ALKPHOS 192* 168*  BILITOT 0.5 1.1   PT/INR No results for input(s): LABPROT, INR in the last 72 hours. Hepatitis Panel No results for input(s): HEPBSAG, HCVAB, HEPAIGM, HEPBIGM in the last 72 hours.  Studies/Results: CT ABDOMEN PELVIS WO CONTRAST  Result Date: 12/16/2019 CLINICAL DATA:  74 year old female with  anemia. Weight loss. Evaluate for malignancy. EXAM: CT ABDOMEN AND PELVIS WITHOUT CONTRAST TECHNIQUE: Multidetector CT imaging of the abdomen and pelvis was performed following the standard protocol without IV contrast. COMPARISON:  None. FINDINGS: Evaluation of this exam is limited in the absence of intravenous contrast. Lower chest: There is a 5 mm left lower lobe calcified granuloma. The visualized lung bases are otherwise clear. No intra-abdominal free air or free fluid. Hepatobiliary: Cirrhosis. Multiple hepatic hypodense lesions measuring up to approximately 3.5 cm consistent with metastatic disease. No intrahepatic biliary ductal dilatation. The gallbladder is unremarkable. Pancreas: Unremarkable. No pancreatic ductal dilatation or surrounding inflammatory changes. Spleen: Normal in size without focal abnormality. Adrenals/Urinary Tract: The adrenal glands are unremarkable. The kidneys, and visualized ureters appear unremarkable. The urinary bladder is collapsed. Stomach/Bowel: There is an area of thickening of the proximal colon involving the cecum and ascending colon with probable degree of stricture just above the ileocecal valve most consistent with malignancy. There is stranding and haziness of the adjacent fat with several nodular density adjacent to the cecum consistent with metastatic disease. The largest pericecal nodule or confluent of nodule medial to the cecum measures 20  x 17 mm (49/3). There is a small hiatal hernia. There is no bowel obstruction. The appendix is normal. Vascular/Lymphatic: Advanced aortoiliac atherosclerotic disease. The IVC is unremarkable. No portal venous gas. There is no adenopathy. Reproductive: The uterus and ovaries are grossly unremarkable. Other: None Musculoskeletal: None mild degenerative changes. No acute osseous pathology. IMPRESSION: 1. Focal area of wall thickening and nodularity involving the cecum and ascending colon with probable degree of stricture just above  the ileocecal valve most consistent with malignancy. Further evaluation with colonoscopy is recommended. 2. Cirrhosis with innumerable hepatic metastatic disease. 3. No bowel obstruction. Normal appendix. 4. Aortic Atherosclerosis (ICD10-I70.0). Electronically Signed   By: Anner Crete M.D.   On: 12/16/2019 22:23   ECHOCARDIOGRAM COMPLETE  Result Date: 12/16/2019 .IMPRESSIONS  1. Left ventricular ejection fraction, by estimation, is 60 to 65%. The left ventricle has normal function. The left ventricle has no regional wall motion abnormalities. There is mild concentric left ventricular hypertrophy. Left ventricular diastolic parameters are indeterminate.  2. Right ventricular systolic function is normal. The right ventricular size is normal.  3. The mitral valve is normal in structure. Trivial mitral valve regurgitation. No evidence of mitral stenosis.  4. The aortic valve was not well visualized. Aortic valve regurgitation is not visualized. No aortic stenosis is present.  5. The inferior vena cava is dilated in size with >50% respiratory variability, suggesting right atrial pressure of 8 mmHg. Comparison(s): No prior Echocardiogram. Conclusion(s)/Recommendation(s): Normal biventricular function without evidence of hemodynamically significant valvular heart disease.    Electronically signed by Buford Dresser MD Signature Date/Time: 12/16/2019/2:19:48 PM    Final     ASSESMENT:    *    Microcytic anemia, symptomatic w fatigue/DOE/weakness.  FOBT+ stool but no history of overt GI bleeding and no significant GI symptomatology. Good response to transfusion, Hgb 5.4 >> 2 PRBCs >> 7.3. Anemia/iron studies not obtained but with microcytosis this is certainly iron deficiency. 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   Hgb 5.4 >> 2 PRBCs >> 7.6.  Anemia panel pndg.    *    Elevated alk phos and elevated but now normal AST, otherwise normal elevated  LFTs. Cirrhosis and liver mets per CT.  Platelets 510.    *    COVID-19 positive 08/2019.  Did not require hospitalization.  COVID-19 negative currently.    *   Hypokalemia, corrected/resolved.     PLAN   *   Obtain pt/inr given new dx cirrhosis.   *   Discussed CT findings w pt.  She is understandbly emotional but understands and taking things a step at a time.  Explained need for EGD and colonscopy to confirm cancer dx and to eval for portal htn and varices.  She is agreeable.    +  Bowel prep split dose moviprep and dulcolax this PM.  Clears.    *   Might need prn low dose ativan to help with nerves and sleep given her new dx.      Azucena Freed  12/17/2019, 10:03 AM Phone [phone]

## 2019-12-17 NOTE — Evaluation (Signed)
Occupational Therapy Evaluation Patient Details Name: Stacy Burns MRN: [id number] DOB: [birth year] Today's Date: 12/17/2019    History of Present Illness Stacy O Brownis a 74 y.o.femalewith medical history significantfor COVID-19 viral infection in January 2021 who now presents with worsening dyspnea associated with generalized weakness.  She went to see her PCP on the day of presentation where her hemoglobin was 5.5. Admitted with severe symptomatic anemia.  Associative symptoms include intermittent RUQ and epigastric pain. GI consulted, plan for EGD and colonoscopy while admitted.    Clinical Impression   This 74 y/o female presents with the above. PTA pt reporting independence with ADL, iADL and functional mobility, was driving and working. Pt completing room level mobility tasks, toileting, standing grooming and bathing ADL today at distant supervision - mod independent level. Pt reports some fatigue with prolonged activity but overall tolerating session well. She reports plans to return home with intermittent assist from children and grandson. Will continue to follow while pt is acutely admitted to address pt overall strength and activity tolerance with functional tasks but do not anticipate pt will require follow up OT services.     Follow Up Recommendations  No OT follow up;Supervision - Intermittent    Equipment Recommendations  None recommended by OT           Precautions / Restrictions Precautions Precaution Comments: watch HR Restrictions Weight Bearing Restrictions: No      Mobility Bed Mobility Overal bed mobility: Modified Independent             General bed mobility comments: HOB elevated  Transfers Overall transfer level: Needs assistance Equipment used: None Transfers: Sit to/from Stand Sit to Stand: Supervision         General transfer comment: for safety and balance    Balance Overall balance assessment: No apparent balance deficits (not  formally assessed)                                         ADL either performed or assessed with clinical judgement   ADL Overall ADL's : At baseline                                       General ADL Comments: pt performing room level mobility without IV pole and pushing IV pole, completing toileting, standing grooming and bathing ADL during session at distant supervision - mod independent level. pt tolerating standing activity well but noted during one instance that pt with HR up to 170s (?unsure of accuracy vs artifact, as soon as we started turning to return to sitting for seated rest HR returned to 103, RN made aware).      Vision         Perception     Praxis      Pertinent Vitals/Pain Pain Assessment: Faces Faces Pain Scale: Hurts a little bit Pain Location: belly "soreness" Pain Descriptors / Indicators: Sore Pain Intervention(s): Monitored during session     Hand Dominance     Extremity/Trunk Assessment Upper Extremity Assessment Upper Extremity Assessment: Generalized weakness   Lower Extremity Assessment Lower Extremity Assessment: Defer to PT evaluation       Communication Communication Communication: No difficulties   Cognition Arousal/Alertness: Awake/alert Behavior During Therapy: WFL for tasks assessed/performed Overall Cognitive Status: Within Functional Limits for tasks  assessed                                     General Comments       Exercises     Shoulder Instructions      Home Living Family/patient expects to be discharged to:: Private residence Living Arrangements: Children(son) Available Help at Discharge: Family Type of Home: House Home Access: Stairs to enter [employer] of Steps: 3 Entrance Stairs-Rails: Right;Left Home Layout: One level     Bathroom Shower/Tub: Tub/shower unit;Walk-in shower   Bathroom Toilet: Handicapped height     Home Equipment: Shower  seat          Prior Functioning/Environment Level of Independence: Independent        Comments: works 2 days, [employer] Problem List: Decreased activity tolerance;Decreased strength      OT Treatment/Interventions: Self-care/ADL training;Therapeutic exercise;Energy conservation;DME and/or AE instruction;Therapeutic activities;Patient/family education;Balance training    OT Goals(Current goals can be found in the care plan section) Acute Rehab OT Goals Patient Stated Goal: return home, maintain her independence OT Goal Formulation: With patient Time For Goal Achievement: 12/31/19 Potential to Achieve Goals: Good  OT Frequency: Min 2X/week   Barriers to D/C:            Co-evaluation              AM-PAC OT "6 Clicks" Daily Activity     Outcome Measure Help from another person eating meals?: None Help from another person taking care of personal grooming?: None Help from another person toileting, which includes using toliet, bedpan, or urinal?: None Help from another person bathing (including washing, rinsing, drying)?: A Little Help from another person to put on and taking off regular upper body clothing?: None Help from another person to put on and taking off regular lower body clothing?: A Little 6 Click Score: 22   End of Session Nurse Communication: Mobility status  Activity Tolerance: Patient tolerated treatment well Patient left: in bed;with call bell/phone within reach  OT Visit Diagnosis: Muscle weakness (generalized) (M62.81)                Time: [id number] OT Time Calculation (min): 34 min Charges:  OT General Charges $OT Visit: 1 Visit OT Evaluation $OT Eval Moderate Complexity: 1 Mod OT Treatments $Self Care/Home Management : 8-22 mins  Stacy Burns, OT Acute Rehabilitation Services Pager [phone] Office [phone]   Stacy Burns 12/17/2019, 10:05 AM

## 2019-12-17 NOTE — TOC Initial Note (Signed)
Transition of Care [facility]) - Initial/Assessment Note    Patient Details  Name: Stacy Burns MRN: [id number] Date of Birth: 1945-12-02  Transition of Care [facility]) CM/SW Contact:    Marilu Favre, RN Phone Number: 12/17/2019, 12:30 PM  Clinical Narrative:                 Patient from home. Son lives with patient. Patient still works at [employer].   Patient has no DME at home.   Patient has PCP and transportation to appointments.  Will continue to follow.   Plan EGD and colonoscopy 5/6.   Expected Discharge Plan: Home/Self Care Barriers to Discharge: Continued Medical Work up   Patient Goals and CMS Choice Patient states their goals for this hospitalization and ongoing recovery are:: to return to home CMS Medicare.gov Compare Post Acute Care list provided to:: Patient Choice offered to / list presented to : NA  Expected Discharge Plan and Services Expected Discharge Plan: Home/Self Care   Discharge Planning Services: CM Consult   Living arrangements for the past 2 months: Single Family Home                 DME Arranged: N/A DME Agency: NA       HH Arranged: NA          Prior Living Arrangements/Services Living arrangements for the past 2 months: Single Family Home Lives with:: Adult Children Patient language and need for interpreter reviewed:: Yes Do you feel safe going back to the place where you live?: Yes      Need for Family Participation in Patient Care: Yes (Comment) Care giver support system in place?: No (comment)   Criminal Activity/Legal Involvement Pertinent to Current Situation/Hospitalization: No - Comment as needed  Activities of Daily Living      Permission Sought/Granted   Permission granted to share information with : No              Emotional Assessment Appearance:: Appears stated age Attitude/Demeanor/Rapport: Engaged Affect (typically observed): Accepting Orientation: : Oriented to Self, Oriented to Place, Oriented to  Time,  Oriented to Situation Alcohol / Substance Use: Not Applicable Psych Involvement: No (comment)  Admission diagnosis:  Symptomatic anemia [D64.9] Patient Active Problem List   Diagnosis Date Noted  . Hepatic cirrhosis ([place]) 12/17/2019  . Metastatic disease ([place]) 12/17/2019  . GI bleed 12/17/2019  . Symptomatic anemia 12/15/2019  . Hypokalemia 12/15/2019  . Positive fecal occult blood test 12/15/2019  . Leukocytosis 12/15/2019  . Bilateral lower extremity edema 12/15/2019  . Abnormal EKG 12/15/2019  . Elevated blood pressure reading 12/15/2019   PCP:  Ferd Hibbs, NP Pharmacy:   [facility], [address] [place] [address] Phone: [phone] Fax: [phone]     Social Determinants of Health (SDOH) Interventions    Readmission Risk Interventions No flowsheet data found.

## 2019-12-17 NOTE — Progress Notes (Addendum)
Progress Note    Stacy Burns  KMM:[id number] DOB: 1945/09/28  DOA: 12/15/2019 PCP: Ferd Hibbs, NP    Brief Narrative:   Medical records reviewed and are as summarized below:  Stacy Burns is an 74 y.o. female [place] history significant for COVID-19 January of this year not hospitalized presented to the emergency department after being referred by her PCP for persistent weakness, dyspnea.  Work-up at her PCPs office revealed a hemoglobin of 5.5.  She was instructed to come to the emergency department.  Assessment/Plan:   Principal Problem:   Symptomatic anemia Active Problems:   Hypokalemia   Leukocytosis   Hepatic cirrhosis (HCC)   Metastatic disease (HCC)   GI bleed   Elevated blood pressure reading   Bilateral lower extremity edema   Abnormal EKG  #1.  Symptomatic anemia/occult GI bleed likely related to malignancy. Transfused 2 units red blood cells hemoglobin 7.3 this morning.  FOBT positive.   Evaluated by gastroenterology who recommended EGD and colonoscopy.  This scheduled for 5/6.  -Clear liquids for now -Appreciate gastroenterology -Reglan per GI -PPI  #2.  Metastatic disease/cirrhosis.  Patient with unintentional weight loss in the setting of #1.  CT abdomen concerning for malignancy at a sending colon/cecum with liver mets in addition to cirrhosis.  Out phos elevated.total bilit 1.1 -follow scope results -consults as indicated -Recheck GI assistance  #3.  Hypokalemia.  Mild.  Resolved -Repleted -Monitor  #4.  Leukocytosis.  Mild.  Improving likely reactive.  She is afebrile hemodynamically stable and nontoxic-appearing    Family Communication/Anticipated D/C date and plan/Code Status   DVT prophylaxis:  ordered. Code Status: Full Code.  Family Communication: patient and questions answered Disposition Plan: Status is: Observation  Status is: Inpatient  Remains inpatient appropriate because:Ongoing diagnostic testing needed not appropriate for  outpatient work up  Dispo: The patient is from: Home              Anticipated d/c is to: Home              Anticipated d/c date is: 2 days              Patient currently is not medically stable to d/c.                Medical Consultants:   GI   Anti-Infectives:   None  Subjective:   Awake alert somewhat teary.  Denies pain or discomfort  Objective:    Vitals:   12/16/19 1514 12/16/19 1813 12/17/19 0119 12/17/19 0550  BP: (!) 159/74 113/68 (!) 162/68 (!) 153/65  Pulse: 89 81 86 77  Resp: '16 18 18 16  '$ Temp: 98.5 F (36.9 C) 98.2 F (36.8 C) 98.2 F (36.8 C) 98.4 F (36.9 C)  TempSrc: Oral Oral Oral Oral  SpO2: 100% 96% 96% 95%  Weight:      Height:        Intake/Output Summary (Last 24 hours) at 12/17/2019 1131 Last data filed at 12/17/2019 7903 Gross per 24 hour  Intake 1241.66 ml  Output --  Net 1241.66 ml   Filed Weights   12/15/19 1800  Weight: 82.6 kg    Exam: General: Cooperative no acute distress CV: Regular rate and rhythm no murmur gallop or rub trace to one lower extremity edema Respiratory: No increased work of breathing breath sounds are distant but clear no crackles no wheezes Abdomen: Soft positive bowel sounds nontender to palpation no guarding or rebounding Musculoskeletal: Joints without  swelling/erythema full range of motion Neuro: Awake alert speech clear facial symmetry oriented x3  Data Reviewed:   I have personally reviewed following labs and imaging studies:  Labs: Labs show the following:   Basic Metabolic Panel: Recent Labs  Lab 12/15/19 1804 12/15/19 1804 12/16/19 0559 12/17/19 0748  NA 134*  --  137 136  K 3.3*   < > 3.3* 4.2  CL 103  --  105 107  CO2 18*  --  19* 21*  GLUCOSE 102*  --  93 115*  BUN 26*  --  20 12  CREATININE 1.22*  --  0.97 1.01*  CALCIUM 8.7*  --  8.3* 8.5*   < > = values in this interval not displayed.   GFR Estimated Creatinine Clearance: 54 mL/min (A) (by C-G formula based on SCr  of 1.01 mg/dL (H)). Liver Function Tests: Recent Labs  Lab 12/15/19 1804 12/16/19 0559  AST 44* 41  ALT 31 28  ALKPHOS 192* 168*  BILITOT 0.5 1.1  PROT 6.7 5.7*  ALBUMIN 3.0* 2.6*   Recent Labs  Lab 12/15/19 1804  LIPASE 40   No results for input(s): AMMONIA in the last 168 hours. Coagulation profile No results for input(s): INR, PROTIME in the last 168 hours.  CBC: Recent Labs  Lab 12/15/19 1804 12/16/19 0559 12/16/19 1629 12/17/19 0748  WBC 13.1* 11.2*  --  11.8*  NEUTROABS  --  8.8*  --   --   HGB 5.4* 7.3* 7.7* 7.6*  HCT 20.5* 24.7* 26.9* 26.6*  MCV 66.3* 71.2*  --  72.1*  PLT 512* 494*  --  510*   Cardiac Enzymes: No results for input(s): CKTOTAL, CKMB, CKMBINDEX, TROPONINI in the last 168 hours. BNP (last 3 results) No results for input(s): PROBNP in the last 8760 hours. CBG: No results for input(s): GLUCAP in the last 168 hours. D-Dimer: No results for input(s): DDIMER in the last 72 hours. Hgb A1c: No results for input(s): HGBA1C in the last 72 hours. Lipid Profile: No results for input(s): CHOL, HDL, LDLCALC, TRIG, CHOLHDL, LDLDIRECT in the last 72 hours. Thyroid function studies: No results for input(s): TSH, T4TOTAL, T3FREE, THYROIDAB in the last 72 hours.  Invalid input(s): FREET3 Anemia work up: No results for input(s): VITAMINB12, FOLATE, FERRITIN, TIBC, IRON, RETICCTPCT in the last 72 hours. Sepsis Labs: Recent Labs  Lab 12/15/19 1804 12/16/19 0559 12/17/19 0748  WBC 13.1* 11.2* 11.8*    Microbiology Recent Results (from the past 240 hour(s))  Respiratory Panel by RT PCR (Flu A&B, Covid) - Nasopharyngeal Swab     Status: None   Collection Time: 12/15/19 10:07 PM   Specimen: Nasopharyngeal Swab  Result Value Ref Range Status   SARS Coronavirus 2 by RT PCR NEGATIVE NEGATIVE Final    Comment: (NOTE) SARS-CoV-2 target nucleic acids are NOT DETECTED. The SARS-CoV-2 RNA is generally detectable in upper respiratoy specimens during the  acute phase of infection. The lowest concentration of SARS-CoV-2 viral copies this assay can detect is 131 copies/mL. A negative result does not preclude SARS-Cov-2 infection and should not be used as the sole basis for treatment or other patient management decisions. A negative result may occur with  improper specimen collection/handling, submission of specimen other than nasopharyngeal swab, presence of viral mutation(s) within the areas targeted by this assay, and inadequate number of viral copies (<131 copies/mL). A negative result must be combined with clinical observations, patient history, and epidemiological information. The expected result is Negative. Fact Sheet for  Patients:  PinkCheek.be Fact Sheet for Healthcare Providers:  GravelBags.it This test is not yet ap proved or cleared by the [place] and  has been authorized for detection and/or diagnosis of SARS-CoV-2 by FDA under an Emergency Use Authorization (EUA). This EUA will remain  in effect (meaning this test can be used) for the duration of the COVID-19 declaration under Section 564(b)(1) of the Act, 21 U.S.C. section 360bbb-3(b)(1), unless the authorization is terminated or revoked sooner.    Influenza A by PCR NEGATIVE NEGATIVE Final   Influenza B by PCR NEGATIVE NEGATIVE Final    Comment: (NOTE) The Xpert Xpress SARS-CoV-2/FLU/RSV assay is intended as an aid in  the diagnosis of influenza from Nasopharyngeal swab specimens and  should not be used as a sole basis for treatment. Nasal washings and  aspirates are unacceptable for Xpert Xpress SARS-CoV-2/FLU/RSV  testing. Fact Sheet for Patients: PinkCheek.be Fact Sheet for Healthcare Providers: GravelBags.it This test is not yet approved or cleared by the [place] FDA and  has been authorized for detection and/or diagnosis of SARS-CoV-2  by  FDA under an Emergency Use Authorization (EUA). This EUA will remain  in effect (meaning this test can be used) for the duration of the  Covid-19 declaration under Section 564(b)(1) of the Act, 21  U.S.C. section 360bbb-3(b)(1), unless the authorization is  terminated or revoked. Performed at [facility], [place] [address] [place], [place] [address]     Procedures and diagnostic studies:  CT ABDOMEN PELVIS WO CONTRAST  Result Date: 12/16/2019 CLINICAL DATA:  74 year old female with anemia. Weight loss. Evaluate for malignancy. EXAM: CT ABDOMEN AND PELVIS WITHOUT CONTRAST TECHNIQUE: Multidetector CT imaging of the abdomen and pelvis was performed following the standard protocol without IV contrast. COMPARISON:  None. FINDINGS: Evaluation of this exam is limited in the absence of intravenous contrast. Lower chest: There is a 5 mm left lower lobe calcified granuloma. The visualized lung bases are otherwise clear. No intra-abdominal free air or free fluid. Hepatobiliary: Cirrhosis. Multiple hepatic hypodense lesions measuring up to approximately 3.5 cm consistent with metastatic disease. No intrahepatic biliary ductal dilatation. The gallbladder is unremarkable. Pancreas: Unremarkable. No pancreatic ductal dilatation or surrounding inflammatory changes. Spleen: Normal in size without focal abnormality. Adrenals/Urinary Tract: The adrenal glands are unremarkable. The kidneys, and visualized ureters appear unremarkable. The urinary bladder is collapsed. Stomach/Bowel: There is an area of thickening of the proximal colon involving the cecum and ascending colon with probable degree of stricture just above the ileocecal valve most consistent with malignancy. There is stranding and haziness of the adjacent fat with several nodular density adjacent to the cecum consistent with metastatic disease. The largest pericecal nodule or confluent of nodule medial to the cecum measures 20 x 17 mm (49/3). There is  a small hiatal hernia. There is no bowel obstruction. The appendix is normal. Vascular/Lymphatic: Advanced aortoiliac atherosclerotic disease. The IVC is unremarkable. No portal venous gas. There is no adenopathy. Reproductive: The uterus and ovaries are grossly unremarkable. Other: None Musculoskeletal: None mild degenerative changes. No acute osseous pathology. IMPRESSION: 1. Focal area of wall thickening and nodularity involving the cecum and ascending colon with probable degree of stricture just above the ileocecal valve most consistent with malignancy. Further evaluation with colonoscopy is recommended. 2. Cirrhosis with innumerable hepatic metastatic disease. 3. No bowel obstruction. Normal appendix. 4. Aortic Atherosclerosis (ICD10-I70.0). Electronically Signed   By: Anner Crete M.D.   On: 12/16/2019 22:23   ECHOCARDIOGRAM COMPLETE  Result  Date: 12/16/2019    ECHOCARDIOGRAM REPORT   Patient Name:   Stacy Burns Date of Exam: 12/16/2019 Medical Rec #:  [id number]    Height:       67.0 in Accession #:    [id number]   Weight:       182.0 lb Date of Birth:  1946-01-08     BSA:          1.943 m Patient Age:    62 years     BP:           136/68 mmHg Patient Gender: F            HR:           79 bpm. Exam Location:  Inpatient Procedure: 2D Echo, Color Doppler and Cardiac Doppler Indications:    R06.9 DOE  History:        Patient has no prior history of Echocardiogram examinations.                 COVID+ in Jan 2021.  Sonographer:    Raquel Sarna Senior RDCS Referring Phys: [id number] [place]  Sonographer Comments: Suboptimal parasternal window. IMPRESSIONS  1. Left ventricular ejection fraction, by estimation, is 60 to 65%. The left ventricle has normal function. The left ventricle has no regional wall motion abnormalities. There is mild concentric left ventricular hypertrophy. Left ventricular diastolic parameters are indeterminate.  2. Right ventricular systolic function is normal. The right ventricular size  is normal.  3. The mitral valve is normal in structure. Trivial mitral valve regurgitation. No evidence of mitral stenosis.  4. The aortic valve was not well visualized. Aortic valve regurgitation is not visualized. No aortic stenosis is present.  5. The inferior vena cava is dilated in size with >50% respiratory variability, suggesting right atrial pressure of 8 mmHg. Comparison(s): No prior Echocardiogram. Conclusion(s)/Recommendation(s): Normal biventricular function without evidence of hemodynamically significant valvular heart disease. FINDINGS  Left Ventricle: Left ventricular ejection fraction, by estimation, is 60 to 65%. The left ventricle has normal function. The left ventricle has no regional wall motion abnormalities. The left ventricular internal cavity size was normal in size. There is  mild concentric left ventricular hypertrophy. Left ventricular diastolic parameters are indeterminate. Right Ventricle: The right ventricular size is normal. No increase in right ventricular wall thickness. Right ventricular systolic function is normal. Left Atrium: Left atrial size was normal in size. Right Atrium: Right atrial size was normal in size. Pericardium: There is no evidence of pericardial effusion. Mitral Valve: The mitral valve is normal in structure. Moderate mitral annular calcification. Trivial mitral valve regurgitation. No evidence of mitral valve stenosis. Tricuspid Valve: The tricuspid valve is normal in structure. Tricuspid valve regurgitation is trivial. No evidence of tricuspid stenosis. Aortic Valve: The aortic valve was not well visualized. Aortic valve regurgitation is not visualized. No aortic stenosis is present. Pulmonic Valve: The pulmonic valve was not well visualized. Pulmonic valve regurgitation is not visualized. No evidence of pulmonic stenosis. Aorta: The aortic root was not well visualized and the ascending aorta was not well visualized. Pulmonary Artery: The pulmonary artery is not  well seen. Venous: The inferior vena cava is dilated in size with greater than 50% respiratory variability, suggesting right atrial pressure of 8 mmHg. IAS/Shunts: The atrial septum is grossly normal.  LEFT VENTRICLE PLAX 2D LVIDd:         4.37 cm  Diastology LVIDs:         2.83 cm  LV  e' lateral:   9.03 cm/s LV PW:         1.32 cm  LV E/e' lateral: 10.5 LV IVS:        1.25 cm  LV e' medial:    6.96 cm/s LVOT diam:     2.10 cm  LV E/e' medial:  13.6 LV SV:         74 LV SV Index:   38 LVOT Area:     3.46 cm  RIGHT VENTRICLE RV S prime:     12.20 cm/s TAPSE (M-mode): 2.0 cm LEFT ATRIUM             Index       RIGHT ATRIUM           Index LA diam:        3.70 cm 1.90 cm/m  RA Area:     11.70 cm LA Vol (A2C):   61.9 ml 31.86 ml/m RA Volume:   24.00 ml  12.35 ml/m LA Vol (A4C):   51.2 ml 26.35 ml/m LA Biplane Vol: 55.9 ml 28.77 ml/m  AORTIC VALVE LVOT Vmax:   112.00 cm/s LVOT Vmean:  74.300 cm/s LVOT VTI:    0.214 m  AORTA Ao Root diam: 3.60 cm Ao Asc diam:  3.30 cm MITRAL VALVE MV Area (PHT): 2.76 cm    SHUNTS MV Decel Time: 275 msec    Systemic VTI:  0.21 m MV E velocity: 94.60 cm/s  Systemic Diam: 2.10 cm MV A velocity: 97.50 cm/s MV E/A ratio:  0.97 Buford Dresser MD Electronically signed by Buford Dresser MD Signature Date/Time: 12/16/2019/2:19:48 PM    Final     Medications:    metoCLOPramide (REGLAN) injection  10 mg Intravenous Once   Followed by   Derrill Memo ON 12/18/2019] metoCLOPramide (REGLAN) injection  10 mg Intravenous Once   pantoprazole  40 mg Oral Daily   peg 3350 powder  1 kit Oral Once   Continuous Infusions:  dextrose 5% lactated ringers with KCl 20 mEq/L 50 mL/hr at 12/16/19 1610     LOS: 0 days   Radene Gunning NP [facility]   How to contact the [facility] Attending or Consulting provider [place] or covering provider during after hours [place], for this patient?  Check the care team in [facility] and look for a) attending/consulting [facility] provider listed and b) the [facility]  team listed Log into [email or web address] and use [place]'s universal password to access. If you do not have the password, please contact the hospital operator. Locate the [facility] provider you are looking for under [facility] and page to a number that you can be directly reached. If you still have difficulty reaching the provider, please page the [facility] (Director on Call) for the Hospitalists listed on amion for assistance.  12/17/2019, 11:31 AM

## 2019-12-17 NOTE — Evaluation (Signed)
Physical Therapy Evaluation Patient Details Name: Stacy Burns MRN: [id number] DOB: [birth year] Today's Date: 12/17/2019   History of Present Illness  Stacy O Brownis a 74 y.o.femalewith medical history significantfor COVID-19 viral infection in January 2021 who now presents with worsening dyspnea associated with generalized weakness.  She went to see her PCP on the day of presentation where her hemoglobin was 5.5. Admitted with severe symptomatic anemia.  Associative symptoms include intermittent RUQ and epigastric pain. GI consulted, plan for EGD and colonoscopy while admitted.   Clinical Impression  Pt is a pleasant female with awareness of her abilities, but is minimally weak on hips with pulses up to 110 mid gait and 103 at end of gait.  Her O2 sat remained at 98% and no outright LOB was observed.  Follow acutely to monitor her vitals and encourage longer gait trips, has accomplished stairs and will benefit from strengthening and balance or endurance training.      Follow Up Recommendations No PT follow up    Equipment Recommendations  None recommended by PT    Recommendations for Other Services       Precautions / Restrictions Precautions Precautions: Fall Precaution Comments: monitor HR and ck sats Restrictions Weight Bearing Restrictions: No      Mobility  Bed Mobility Overal bed mobility: Modified Independent             General bed mobility comments: HOB elevated  Transfers Overall transfer level: Modified independent Equipment used: None Transfers: Sit to/from Stand           General transfer comment: assisting with IV but not pt  Ambulation/Gait Ambulation/Gait assistance: Supervision Gait Distance (Feet): 100 Feet Assistive device: IV Pole Gait Pattern/deviations: Step-through pattern;Decreased stride length;Wide base of support Gait velocity: reduced Gait velocity interpretation: <1.31 ft/sec, indicative of household ambulator General Gait  Details: no LOB and walking wiht pt directing IV pole, backing up a few steps with it  Stairs Stairs: Yes Stairs assistance: Min guard Stair Management: Two rails;Alternating pattern;Forwards Number of Stairs: 3 General stair comments: paced and careful  Wheelchair Mobility    Modified Rankin (Stroke Patients Only)       Balance Overall balance assessment: Needs assistance Sitting-balance support: Feet supported Sitting balance-Leahy Scale: Good     Standing balance support: Single extremity supported;During functional activity Standing balance-Leahy Scale: Fair                               Pertinent Vitals/Pain Pain Assessment: No/denies pain    Home Living Family/patient expects to be discharged to:: Private residence Living Arrangements: Children(son) Available Help at Discharge: Family Type of Home: House Home Access: Stairs to enter Entrance Stairs-Rails: Right;Left;Can reach both Entrance Stairs-Number of Steps: 3 Home Layout: One level Home Equipment: Environmental consultant - 2 wheels;Shower seat;Other (comment)(high commode design)      Prior Function Level of Independence: Independent         Comments: works 2 days, parts/billing Hydrologist Dominance   Dominant Hand: Right    Extremity/Trunk Assessment   Upper Extremity Assessment Upper Extremity Assessment: Defer to OT evaluation    Lower Extremity Assessment Lower Extremity Assessment: Generalized weakness    Cervical / Trunk Assessment Cervical / Trunk Assessment: Normal  Communication   Communication: No difficulties  Cognition Arousal/Alertness: Awake/alert Behavior During Therapy: WFL for tasks assessed/performed Overall Cognitive Status: Within Functional Limits for tasks assessed  General Comments General comments (skin integrity, edema, etc.): Pt is getting up to walk with PT but taking IV to bathroom alone.     Exercises     Assessment/Plan    PT Assessment Patient needs continued PT services  PT Problem List Decreased range of motion;Decreased mobility;Cardiopulmonary status limiting activity       PT Treatment Interventions DME instruction;Gait training;Stair training;Functional mobility training;Therapeutic activities;Therapeutic exercise;Balance training;Neuromuscular re-education;Patient/family education    PT Goals (Current goals can be found in the Care Plan section)  Acute Rehab PT Goals Patient Stated Goal: return home, maintain her independence PT Goal Formulation: With patient Time For Goal Achievement: 12/24/19 Potential to Achieve Goals: Good    Frequency Min 3X/week   Barriers to discharge Inaccessible home environment stairs to enter house    Co-evaluation               AM-PAC PT "6 Clicks" Mobility  Outcome Measure Help needed turning from your back to your side while in a flat bed without using bedrails?: None Help needed moving from lying on your back to sitting on the side of a flat bed without using bedrails?: None Help needed moving to and from a bed to a chair (including a wheelchair)?: A Little Help needed standing up from a chair using your arms (e.g., wheelchair or bedside chair)?: A Little Help needed to walk in hospital room?: A Little Help needed climbing 3-5 steps with a railing? : A Little 6 Click Score: 20    End of Session Equipment Utilized During Treatment: Gait belt Activity Tolerance: Treatment limited secondary to medical complications (Comment) Patient left: in bed;with call bell/phone within reach;with nursing/sitter in room Nurse Communication: Mobility status PT Visit Diagnosis: Difficulty in walking, not elsewhere classified (R26.2)    Time: [id number] PT Time Calculation (min) (ACUTE ONLY): 20 min   Charges:   PT Evaluation $PT Eval Moderate Complexity: 1 Mod         Stacy Burns 12/17/2019, 12:15 PM  Mee Hives, PT  MS Acute Rehab Dept. Number: [place] and [place]

## 2019-12-18 ENCOUNTER — Inpatient Hospital Stay (HOSPITAL_COMMUNITY): Payer: BC Managed Care – PPO | Admitting: Certified Registered Nurse Anesthetist

## 2019-12-18 ENCOUNTER — Encounter (HOSPITAL_COMMUNITY): Payer: Self-pay | Admitting: Internal Medicine

## 2019-12-18 ENCOUNTER — Encounter (HOSPITAL_COMMUNITY)
Admission: EM | Disposition: A | Discharge status: Home / Self Care | Payer: Self-pay | Source: Home / Self Care | Attending: Internal Medicine

## 2019-12-18 ENCOUNTER — Other Ambulatory Visit: Payer: Self-pay

## 2019-12-18 DIAGNOSIS — K6389 Other specified diseases of intestine: Secondary | ICD-10-CM

## 2019-12-18 DIAGNOSIS — K5669 Other partial intestinal obstruction: Secondary | ICD-10-CM

## 2019-12-18 DIAGNOSIS — D490 Neoplasm of unspecified behavior of digestive system: Secondary | ICD-10-CM

## 2019-12-18 DIAGNOSIS — D509 Iron deficiency anemia, unspecified: Secondary | ICD-10-CM

## 2019-12-18 HISTORY — PX: BIOPSY: SHX5522

## 2019-12-18 HISTORY — PX: COLONOSCOPY WITH PROPOFOL: SHX5780

## 2019-12-18 HISTORY — PX: SUBMUCOSAL TATTOO INJECTION: SHX6856

## 2019-12-18 HISTORY — PX: ESOPHAGOGASTRODUODENOSCOPY (EGD) WITH PROPOFOL: SHX5813

## 2019-12-18 LAB — CBC
HCT: 27.9 % — ABNORMAL LOW (ref 36.0–46.0)
Hemoglobin: 8.3 g/dL — ABNORMAL LOW (ref 12.0–15.0)
MCH: 21 pg — ABNORMAL LOW (ref 26.0–34.0)
MCHC: 29.7 g/dL — ABNORMAL LOW (ref 30.0–36.0)
MCV: 70.5 fL — ABNORMAL LOW (ref 80.0–100.0)
Platelets: 533 10*3/uL — ABNORMAL HIGH (ref 150–400)
RBC: 3.96 MIL/uL (ref 3.87–5.11)
RDW: 24.4 % — ABNORMAL HIGH (ref 11.5–15.5)
WBC: 16.1 10*3/uL — ABNORMAL HIGH (ref 4.0–10.5)
nRBC: 0 % (ref 0.0–0.2)

## 2019-12-18 LAB — BASIC METABOLIC PANEL
Anion gap: 11 (ref 5–15)
BUN: 10 mg/dL (ref 8–23)
CO2: 21 mmol/L — ABNORMAL LOW (ref 22–32)
Calcium: 8.8 mg/dL — ABNORMAL LOW (ref 8.9–10.3)
Chloride: 102 mmol/L (ref 98–111)
Creatinine, Ser: 0.91 mg/dL (ref 0.44–1.00)
GFR calc Af Amer: >60 mL/min (ref >60)
GFR calc non Af Amer: >60 mL/min (ref >60)
Glucose, Bld: 160 mg/dL — ABNORMAL HIGH (ref 70–99)
Potassium: 4.5 mmol/L (ref 3.5–5.1)
Sodium: 134 mmol/L — ABNORMAL LOW (ref 135–145)

## 2019-12-18 LAB — PROTIME-INR
INR: 1.1 (ref 0.8–1.2)
Prothrombin Time: 13.3 seconds (ref 11.4–15.2)

## 2019-12-18 SURGERY — ESOPHAGOGASTRODUODENOSCOPY (EGD) WITH PROPOFOL
Anesthesia: Monitor Anesthesia Care

## 2019-12-18 MED ORDER — PROPOFOL 10 MG/ML IV BOLUS
INTRAVENOUS | Status: DC | PRN
  Administered 2019-12-18 (×3): 10 mg via INTRAVENOUS

## 2019-12-18 MED ORDER — PROPOFOL 500 MG/50ML IV EMUL
INTRAVENOUS | Status: DC | PRN
  Administered 2019-12-18: 100 ug/kg/min via INTRAVENOUS

## 2019-12-18 MED ORDER — SPOT INK MARKER SYRINGE KIT
PACK | SUBMUCOSAL | Status: AC
  Filled 2019-12-18: qty 5

## 2019-12-18 MED ORDER — SPOT INK MARKER SYRINGE KIT
PACK | SUBMUCOSAL | Status: DC | PRN
  Administered 2019-12-18: 5 mL via SUBMUCOSAL

## 2019-12-18 MED ORDER — LIDOCAINE 2% (20 MG/ML) 5 ML SYRINGE
INTRAMUSCULAR | Status: DC | PRN
  Administered 2019-12-18: 40 mg via INTRAVENOUS

## 2019-12-18 MED ORDER — DEXMEDETOMIDINE HCL 200 MCG/2ML IV SOLN
INTRAVENOUS | Status: DC | PRN
  Administered 2019-12-18 (×2): 4 ug via INTRAVENOUS
  Administered 2019-12-18: 8 ug via INTRAVENOUS

## 2019-12-18 MED ORDER — FERROUS SULFATE 325 (65 FE) MG PO TABS
325.0000 mg | ORAL_TABLET | Freq: Two times a day (BID) | ORAL | Status: DC
  Administered 2019-12-18 – 2019-12-20 (×3): 325 mg via ORAL
  Filled 2019-12-18 (×3): qty 1

## 2019-12-18 MED ORDER — CIPROFLOXACIN IN D5W 400 MG/200ML IV SOLN
400.0000 mg | INTRAVENOUS | Status: AC
  Administered 2019-12-19: 400 mg via INTRAVENOUS
  Filled 2019-12-18: qty 200

## 2019-12-18 MED ORDER — PHENYLEPHRINE 40 MCG/ML (10ML) SYRINGE FOR IV PUSH (FOR BLOOD PRESSURE SUPPORT)
PREFILLED_SYRINGE | INTRAVENOUS | Status: DC | PRN
  Administered 2019-12-18: 80 ug via INTRAVENOUS

## 2019-12-18 MED ORDER — LACTATED RINGERS IV SOLN
INTRAVENOUS | Status: AC | PRN
  Administered 2019-12-18: 1000 mL via INTRAVENOUS

## 2019-12-18 SURGICAL SUPPLY — 25 items

## 2019-12-18 NOTE — Progress Notes (Signed)
Pt in endoscopy, will reattempt as time and pt allow.   12/18/19 1200  PT Visit Information  Last PT Received On 12/18/19  Reason Eval/Treat Not Completed Patient at procedure or test/unavailable   Mee Hives, PT MS Acute Rehab Dept. Number: [place] and [place]

## 2019-12-18 NOTE — Consult Note (Signed)
Stacy Burns 1946/07/04  [id number].    Requesting MD: Dr. Thornton Park Chief Complaint/Reason for Consult: right colon mass with liver mets  HPI:  This is a healthy 74 yo white female with no significant medical problems who was diagnosed with COVID in January of this year.  She has struggled with SOB since that time as well as weakness.  She has lost 35lbs.  She denies any abdominal pain, nausea, or vomiting.  She states that she does not look at her stool so she is unaware of whether she has had any blood in her stool.  She does state that her bowels have changed and she has a solid bowel movement in the morning and more liquid stool at night which is new for her.  She denies ever having had a colonoscopy.  She denies any family history of GI malignancy.   Because of her SOB and weakness she saw her PCP and was found to have a hgb of 5.  She was referred to the ED where she was admitted for work up.  She has a CT scan that reveals an area in her right colon concerning for a mass as well as multiple nodules in her liver concerning for liver mets.  She underwent a colonoscopy today which revealed a partially obstructing cecal/right colon mass.  We have been asked to see her for further recommendations.  ROS: ROS: Please see HPI, otherwise all other systems have been reviewed and are negative.  Family History  Problem Relation Age of Onset  . Multiple myeloma Mother   . Diabetes Mellitus II Mother   . Leukemia Father   . Muscular dystrophy Brother     Past Medical History:  Diagnosis Date  . COVID-19 virus infection 08/2019   not hospitalized.    . Severe anemia 12/17/2019    Past Surgical History:  Procedure Laterality Date  . TUBAL LIGATION      Social History:  reports that she quit smoking about 35 years ago. Her smoking use included cigarettes. She quit after 25.00 years of use. She has never used smokeless tobacco. She reports previous alcohol use. She reports that  she does not use drugs.  Allergies:  Allergies  Allergen Reactions  . Milk-Related Compounds Nausea Only and Other (See Comments)    Delayed reaction if too much consumed   . Penicillins Anaphylaxis and Other (See Comments)    Both parents had anaphylactic reactions to this  . Pineapple Swelling and Other (See Comments)    Tongue swells and causes acid bumps there, also  . Chocolate Other (See Comments)    Nasal congestion  . Other Itching, Rash and Other (See Comments)    "Most all antibiotics and OTC cold meds break me out" Allergic to ALL NUTS, also = Itching  . Sulfa Antibiotics Rash and Other (See Comments)    Severe rash    Medications Prior to Admission  Medication Sig Dispense Refill  . acetaminophen (TYLENOL) 325 MG tablet Take 325 mg by mouth every 6 (six) hours as needed for mild pain or headache.    Marland Kitchen aspirin 81 MG chewable tablet Chew 81 mg by mouth every 3 (three) days.    Marland Kitchen albuterol (VENTOLIN HFA) 108 (90 Base) MCG/ACT inhaler Inhale 1-2 puffs into the lungs See admin instructions. Inhale 1-2 puffs into the lungs every four to six hours as needed for shortness of breath or wheezing    . SPIRIVA RESPIMAT 2.5 MCG/ACT  AERS Inhale 2 puffs into the lungs daily.        Physical Exam: Blood pressure (!) 123/44, pulse 69, temperature (!) 97 F (36.1 C), temperature source Temporal, resp. rate 15, height '5\' 7"'$  (1.702 m), weight 79.4 kg, SpO2 98 %. General: pleasant, obese white female who is laying in bed in NAD HEENT: head is normocephalic, atraumatic.  Sclera are noninjected.  PERRL.  Ears and nose without any masses or lesions.  Mouth is pink and moist Heart: regular, rate, and rhythm.  Normal s1,s2. No obvious murmurs, gallops, or rubs noted.  Palpable radial and pedal pulses bilaterally Lungs: CTAB, no wheezes, rhonchi, or rales noted.  Respiratory effort nonlabored Abd: soft, NT, ND, +BS, no masses, hernias, or organomegaly MS: all 4 extremities are symmetrical with  no cyanosis, clubbing, or edema. Skin: warm and dry with no masses, lesions, or rashes Neuro: Cranial nerves 2-12 grossly intact, sensation is normal throughout Psych: A&Ox3 with an appropriate affect.   Results for orders placed or performed during the hospital encounter of 12/15/19 (from the past 48 hour(s))  Hemoglobin and hematocrit, blood     Status: Abnormal   Collection Time: 12/16/19  4:29 PM  Result Value Ref Range   Hemoglobin 7.7 (L) 12.0 - 15.0 g/dL   HCT 26.9 (L) 36.0 - 46.0 %    Comment: Performed at [facility], [address] [place], [place] [address]  CBC     Status: Abnormal   Collection Time: 12/17/19  7:48 AM  Result Value Ref Range   WBC 11.8 (H) 4.0 - 10.5 K/uL   RBC 3.69 (L) 3.87 - 5.11 MIL/uL   Hemoglobin 7.6 (L) 12.0 - 15.0 g/dL    Comment: Reticulocyte Hemoglobin testing may be clinically indicated, consider ordering this additional test [id number]    HCT 26.6 (L) 36.0 - 46.0 %   MCV 72.1 (L) 80.0 - 100.0 fL   MCH 20.6 (L) 26.0 - 34.0 pg   MCHC 28.6 (L) 30.0 - 36.0 g/dL   RDW 24.0 (H) 11.5 - 15.5 %   Platelets 510 (H) 150 - 400 K/uL   nRBC 0.0 0.0 - 0.2 %    Comment: Performed at [facility], [place] [address], [place], [place] [address]  Basic metabolic panel     Status: Abnormal   Collection Time: 12/17/19  7:48 AM  Result Value Ref Range   Sodium 136 135 - 145 mmol/L   Potassium 4.2 3.5 - 5.1 mmol/L   Chloride 107 98 - 111 mmol/L   CO2 21 (L) 22 - 32 mmol/L   Glucose, Bld 115 (H) 70 - 99 mg/dL    Comment: Glucose reference range applies only to samples taken after fasting for at least 8 hours.   BUN 12 8 - 23 mg/dL   Creatinine, Ser 1.01 (H) 0.44 - 1.00 mg/dL   Calcium 8.5 (L) 8.9 - 10.3 mg/dL   GFR calc non Af Amer 55 (L) >60 mL/min   GFR calc Af Amer >60 >60 mL/min   Anion gap 8 5 - 15    Comment: Performed at [facility] [address] [place] [address]  CBC     Status: Abnormal   Collection Time: 12/18/19   1:13 AM  Result Value Ref Range   WBC 16.1 (H) 4.0 - 10.5 K/uL   RBC 3.96 3.87 - 5.11 MIL/uL   Hemoglobin 8.3 (L) 12.0 - 15.0 g/dL    Comment: Reticulocyte  Hemoglobin testing may be clinically indicated, consider ordering this additional test [id number]    HCT 27.9 (L) 36.0 - 46.0 %   MCV 70.5 (L) 80.0 - 100.0 fL   MCH 21.0 (L) 26.0 - 34.0 pg   MCHC 29.7 (L) 30.0 - 36.0 g/dL   RDW 24.4 (H) 11.5 - 15.5 %   Platelets 533 (H) 150 - 400 K/uL   nRBC 0.0 0.0 - 0.2 %    Comment: Performed at [facility] [address], [place], [place] [address]  Basic metabolic panel     Status: Abnormal   Collection Time: 12/18/19  1:13 AM  Result Value Ref Range   Sodium 134 (L) 135 - 145 mmol/L   Potassium 4.5 3.5 - 5.1 mmol/L   Chloride 102 98 - 111 mmol/L   CO2 21 (L) 22 - 32 mmol/L   Glucose, Bld 160 (H) 70 - 99 mg/dL    Comment: Glucose reference range applies only to samples taken after fasting for at least 8 hours.   BUN 10 8 - 23 mg/dL   Creatinine, Ser 0.91 0.44 - 1.00 mg/dL   Calcium 8.8 (L) 8.9 - 10.3 mg/dL   GFR calc non Af Amer >60 >60 mL/min   GFR calc Af Amer >60 >60 mL/min   Anion gap 11 5 - 15    Comment: Performed at [facility] [address], [place], [place] [address]   CT ABDOMEN PELVIS WO CONTRAST  Result Date: 12/16/2019 CLINICAL DATA:  74 year old female with anemia. Weight loss. Evaluate for malignancy. EXAM: CT ABDOMEN AND PELVIS WITHOUT CONTRAST TECHNIQUE: Multidetector CT imaging of the abdomen and pelvis was performed following the standard protocol without IV contrast. COMPARISON:  None. FINDINGS: Evaluation of this exam is limited in the absence of intravenous contrast. Lower chest: There is a 5 mm left lower lobe calcified granuloma. The visualized lung bases are otherwise clear. No intra-abdominal free air or free fluid. Hepatobiliary: Cirrhosis. Multiple hepatic hypodense lesions measuring up to approximately 3.5 cm consistent with metastatic disease.  No intrahepatic biliary ductal dilatation. The gallbladder is unremarkable. Pancreas: Unremarkable. No pancreatic ductal dilatation or surrounding inflammatory changes. Spleen: Normal in size without focal abnormality. Adrenals/Urinary Tract: The adrenal glands are unremarkable. The kidneys, and visualized ureters appear unremarkable. The urinary bladder is collapsed. Stomach/Bowel: There is an area of thickening of the proximal colon involving the cecum and ascending colon with probable degree of stricture just above the ileocecal valve most consistent with malignancy. There is stranding and haziness of the adjacent fat with several nodular density adjacent to the cecum consistent with metastatic disease. The largest pericecal nodule or confluent of nodule medial to the cecum measures 20 x 17 mm (49/3). There is a small hiatal hernia. There is no bowel obstruction. The appendix is normal. Vascular/Lymphatic: Advanced aortoiliac atherosclerotic disease. The IVC is unremarkable. No portal venous gas. There is no adenopathy. Reproductive: The uterus and ovaries are grossly unremarkable. Other: None Musculoskeletal: None mild degenerative changes. No acute osseous pathology. IMPRESSION: 1. Focal area of wall thickening and nodularity involving the cecum and ascending colon with probable degree of stricture just above the ileocecal valve most consistent with malignancy. Further evaluation with colonoscopy is recommended. 2. Cirrhosis with innumerable hepatic metastatic disease. 3. No bowel obstruction. Normal appendix. 4. Aortic Atherosclerosis (ICD10-I70.0). Electronically Signed   By: Anner Crete M.D.   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  CEA level has been ordered.  She will need a  biopsy of her liver nodules.  She will likely need a CT of her chest for complete staging but will defer timing or necessity of this to Dr. Benay Spice.  We have reviewed her imaging and discussed this case with Dr. Benay Spice.  Given she is not obstructed or briskly bleeding from this lesion, surgery will be deferred at this time to systemic therapy.  We have been asked to place a [facility] tomorrow so she can start chemotherapy when ready.  She may have a diet tonight and NPO p MN for this tomorrow.  IR has also been consulted for a liver lesion bx as well.  This has all been discussed with the patient and her daughter who is at the bedside.  They are in agreement with this plan.  She may eat tonight, but NPO p MN.  FEN - regular diet, NPO p MN VTE - none currently due to admitting anemia ID - [email or web address] on call to [place], [facility] Surgery 12/18/2019, 3:47 PM Please see Amion for pager number during day hours 7:00am-4:30pm or 7:00am -11:30am on weekends

## 2019-12-18 NOTE — Anesthesia Postprocedure Evaluation (Signed)
Anesthesia Post Note  Patient: Sherald Hess  Procedure(s) Performed: ESOPHAGOGASTRODUODENOSCOPY (EGD) WITH PROPOFOL (N/A ) COLONOSCOPY WITH PROPOFOL (N/A ) BIOPSY SUBMUCOSAL TATTOO INJECTION     Patient location during evaluation: PACU Anesthesia Type: MAC Level of consciousness: awake and alert Pain management: pain level controlled Vital Signs Assessment: post-procedure vital signs reviewed and stable Respiratory status: spontaneous breathing Cardiovascular status: stable Anesthetic complications: no    Last Vitals:  Vitals:   12/18/19 1124 12/18/19 1315  BP: 125/75 (!) 100/32  Pulse:  69  Resp: 17 (!) 22  Temp: 37.2 C (!) 36.1 C  SpO2: 99% 100%    Last Pain:  Vitals:   12/18/19 1315  TempSrc: Temporal  PainSc: 0-No pain                 Nolon Nations

## 2019-12-18 NOTE — Transfer of Care (Signed)
Immediate Anesthesia Transfer of Care Note  Patient: Stacy Burns  Procedure(s) Performed: ESOPHAGOGASTRODUODENOSCOPY (EGD) WITH PROPOFOL (N/A ) COLONOSCOPY WITH PROPOFOL (N/A ) BIOPSY SUBMUCOSAL TATTOO INJECTION  Patient Location: Endoscopy Unit  Anesthesia Type:MAC  Level of Consciousness: drowsy  Airway & Oxygen Therapy: Patient Spontanous Breathing and Patient connected to nasal cannula oxygen  Post-op Assessment: Report given to RN and Post -op Vital signs reviewed and stable  Post vital signs: Reviewed and stable  Last Vitals:  Vitals Value Taken Time  BP 100/32 12/18/19 1315  Temp    Pulse 68 12/18/19 1316  Resp 17 12/18/19 1316  SpO2 100 % 12/18/19 1316  Vitals shown include unvalidated device data.  Last Pain:  Vitals:   12/18/19 1124  TempSrc: Oral  PainSc:          Complications: No apparent anesthesia complications

## 2019-12-18 NOTE — Anesthesia Preprocedure Evaluation (Signed)
Anesthesia Evaluation  Patient identified by MRN, date of birth, ID band Patient awake    Reviewed: Allergy & Precautions, NPO status , Patient's Chart, lab work & pertinent test results  Airway Mallampati: II  TM Distance: >3 FB Neck ROM: Full    Dental  (+) Dental Advisory Given, Edentulous Upper, Edentulous Lower   Pulmonary former smoker,    Pulmonary exam normal breath sounds clear to auscultation       Cardiovascular negative cardio ROS Normal cardiovascular exam Rhythm:Regular Rate:Normal     Neuro/Psych negative neurological ROS  negative psych ROS   GI/Hepatic negative GI ROS, Neg liver ROS,   Endo/Other  negative endocrine ROS  Renal/GU negative Renal ROS     Musculoskeletal negative musculoskeletal ROS (+)   Abdominal   Peds  Hematology  (+) Blood dyscrasia, anemia ,   Anesthesia Other Findings   Reproductive/Obstetrics negative OB ROS                             Anesthesia Physical Anesthesia Plan  ASA: III  Anesthesia Plan: MAC   Post-op Pain Management:    Induction: Intravenous  PONV Risk Score and Plan: 2 and Ondansetron, Propofol infusion and Treatment may vary due to age or medical condition  Airway Management Planned:   Additional Equipment: None  Intra-op Plan:   Post-operative Plan:   Informed Consent: I have reviewed the patients History and Physical, chart, labs and discussed the procedure including the risks, benefits and alternatives for the proposed anesthesia with the patient or authorized representative who has indicated his/her understanding and acceptance.     Dental advisory given  Plan Discussed with: CRNA  Anesthesia Plan Comments:         Anesthesia Quick Evaluation

## 2019-12-18 NOTE — Op Note (Signed)
[facility] Patient Name: Stacy Burns Procedure Date : 12/18/2019 MRN: [id number] Attending MD: Thornton Park MD, MD Date of Birth: May 23, 1946 CSN: [id number] Age: 74 Admit Type: Inpatient Procedure:                Upper GI endoscopy Indications:              Unexplained iron deficiency anemia Providers:                Thornton Park MD, MD, Josie Dixon, RN, Theodora Blow, Technician Referring MD:              Medicines:                Monitored Anesthesia Care Complications:            No immediate complications. Estimated Blood Loss:     Estimated blood loss: none. Procedure:                Pre-Anesthesia Assessment:                           - Prior to the procedure, a History and Physical                            was performed, and patient medications and                            allergies were reviewed. The patient's tolerance of                            previous anesthesia was also reviewed. The risks                            and benefits of the procedure and the sedation                            options and risks were discussed with the patient.                            All questions were answered, and informed consent                            was obtained. Prior Anticoagulants: The patient has                            taken no previous anticoagulant or antiplatelet                            agents. ASA Grade Assessment: II - A patient with                            mild systemic disease. After reviewing the risks  and benefits, the patient was deemed in                            satisfactory condition to undergo the procedure.                           After obtaining informed consent, the endoscope was                            passed under direct vision. Throughout the                            procedure, the patient's blood pressure, pulse, and                            oxygen  saturations were monitored continuously. The                            [id number]) Olympus gastroscope was                            introduced through the mouth, and advanced to the                            second part of duodenum. The upper GI endoscopy was                            accomplished without difficulty. The patient                            tolerated the procedure well. Scope In: Scope Out: Findings:      The esophagus was normal. No esophageal varices.      The stomach was normal except for a small hiatal hernia. No portal       hypertensive gastropathy or gastric varices.      The examined duodenum was normal. Impression:               - Normal esophagus.                           - Normal stomach.                           - Normal examined duodenum.                           - No evidence for portal hypertension. Recommendation:           - Return patient to hospital ward for ongoing care.                           - Clear liquid diet.                           - Continue present medications.                           -  Proceed with colonoscopy as previously planned. Procedure Code(s):        --- Professional ---                           [phone], Esophagogastroduodenoscopy, flexible,                            transoral; diagnostic, including collection of                            specimen(s) by brushing or washing, when performed                            (separate procedure) Diagnosis Code(s):        --- Professional ---                           D50.9, Iron deficiency anemia, unspecified CPT copyright 2019 American Medical Association. All rights reserved. The codes documented in this report are preliminary and upon coder review may  be revised to meet current compliance requirements. Thornton Park MD, MD 12/18/2019 1:22:54 PM This report has been signed electronically. Number of Addenda: 0

## 2019-12-18 NOTE — Progress Notes (Signed)
Patient arrived back from Endo in NAD, daughter at bedside.

## 2019-12-18 NOTE — Progress Notes (Signed)
Initial Nutrition Assessment  DOCUMENTATION CODES:   Not applicable  INTERVENTION:   Once diet advanced:   Ensure Enlive po BID, each supplement provides 350 kcal and 20 grams of protein  MVI daily   NUTRITION DIAGNOSIS:   Increased nutrient needs related to acute illness as evidenced by estimated needs.  GOAL:   Patient will meet greater than or equal to 90% of their needs  MONITOR:   PO intake, Supplement acceptance, Weight trends, Labs, I & O's, Diet advancement  REASON FOR ASSESSMENT:   Malnutrition Screening Tool    ASSESSMENT:   Patient with PMH significant for COVID-19 infection in January 2021. Presents this admission with symptomatic anemia with concern for GI bleed.  Pt undergoing endoscopy/colonoscopy at time of RD visit. Spoke with daughter at beside. Endorses pt had poor appetite since January 2021 upon diagnosis of COVID and her husbands passing. Experienced taste and smell changes. During this time she consumed soups, fruit, pizza, and chicken salads 2-3 times daily. Does not use supplementation but is open to trying. Currently NPO. RD to provide supplementation once diet advanced.   Daughter reports pt lost 30 lb from her usual body weight of 200 lb (since Jan). Records lack weight history. Suspect malnutrition but unable to diagnosis without NFPE.   Medications: reglan Labs: Na 134 (L)   Diet Order:   Diet Order            Diet NPO time specified  Diet effective ____              EDUCATION NEEDS:   Not appropriate for education at this time  Skin:  Skin Assessment: Reviewed RN Assessment  Last BM:  5/6  Height:   Ht Readings from Last 1 Encounters:  12/18/19 5\' 7"  (1.702 m)    Weight:   Wt Readings from Last 1 Encounters:  12/18/19 79.4 kg    BMI:  Body mass index is 27.41 kg/m.  Estimated Nutritional Needs:   Kcal:  2000-2200 kcal  Protein:  100-120 grams  Fluid:  >/= 2 L/day   Mariana Single RD, LDN Clinical  Nutrition Pager listed in [place]

## 2019-12-18 NOTE — Interval H&P Note (Signed)
History and Physical Interval Note:  12/18/2019 11:39 AM  Stacy Burns  has presented today for surgery, with the diagnosis of Anemia.  FOBT positive Bohnet stool.  Microcytosis..  The various methods of treatment have been discussed with the patient and family. After consideration of risks, benefits and other options for treatment, the patient has consented to  Procedure(s): ESOPHAGOGASTRODUODENOSCOPY (EGD) WITH PROPOFOL (N/A) COLONOSCOPY WITH PROPOFOL (N/A) as a surgical intervention.  The patient's history has been reviewed, patient examined, no change in status, stable for surgery.  I have reviewed the patient's chart and labs.  Questions were answered to the patient's satisfaction.     Thornton Park

## 2019-12-18 NOTE — Op Note (Addendum)
[facility] Patient Name: Stacy Burns Procedure Date : 12/18/2019 MRN: [id number] Attending MD: Thornton Park MD, MD Date of Birth: 08-15-1945 CSN: [id number] Age: 74 Admit Type: Inpatient Procedure:                Colonoscopy Indications:              Abnormal CT of the GI tract with ascending colon                            mass and liver mets identified Providers:                Thornton Park MD, MD, Josie Dixon, RN, Theodora Blow, Technician Referring MD:              Medicines:                Monitored Anesthesia Care Complications:            No immediate complications. Estimated blood loss:                            Minimal. Estimated Blood Loss:     Estimated blood loss was minimal. Procedure:                Pre-Anesthesia Assessment:                           - Prior to the procedure, a History and Physical                            was performed, and patient medications and                            allergies were reviewed. The patient's tolerance of                            previous anesthesia was also reviewed. The risks                            and benefits of the procedure and the sedation                            options and risks were discussed with the patient.                            All questions were answered, and informed consent                            was obtained. Prior Anticoagulants: The patient has                            taken no previous anticoagulant or antiplatelet  agents. ASA Grade Assessment: II - A patient with                            mild systemic disease. After reviewing the risks                            and benefits, the patient was deemed in                            satisfactory condition to undergo the procedure.                           After obtaining informed consent, the colonoscope                            was passed under direct vision.  Throughout the                            procedure, the patient's blood pressure, pulse, and                            oxygen saturations were monitored continuously. The                            [id number]) Olympus pediatric colonscope                            was introduced through the anus with the intention                            of advancing to the ileum. The scope was advanced                            to the ascending colon before the procedure was                            aborted. Medications were given. The colonoscopy                            was technically difficult and complex due to                            significant looping and a tortuous colon requiring                            abdominal wall pressure and patient repositioning.                            The patient tolerated the procedure well. The                            quality of the bowel preparation was good. Scope In: 12:44:46 PM Scope Out: 1:06:30 PM Total Procedure Duration: 0 hours 21 minutes 44  seconds  Findings:      Hemorrhoids were found on perianal exam.      Non-bleeding internal hemorrhoids were found. The hemorrhoids were small.      An ulcerated partially obstructing large mass was found in the mid       ascending colon. I was unable to advance more proximal to the mass.       Formed stool and food fibers were seen in the proximal colon above the       mass. This was biopsied with a cold forceps for histology. Area was       tattooed with an injection of 4 mL of Spot (carbon black).      The exam was otherwise without abnormality on direct and retroflexion       views. Some residual stool was present throughout the colon. Impression:               - Hemorrhoids found on perianal exam.                           - Non-bleeding internal hemorrhoids.                           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 Biopsied. Tattooed.                           - The examination was otherwise normal on direct                            and retroflexion views. Recommendation:           - Return patient to hospital ward for ongoing care.                           - Clear liquid diet.                           - Continue present medications.                           - Await pathology results.                           - Surgery and oncology consultations.                           - Obtain CEA.                           - Given these results, all first degree relatives                            (brothers, sisters, children, parents) should start                            colon cancer screening at age 80.                           -  Results and recommendations reviewed with the                            patient's daughter, Stacy Burns. All questions answered                            to her satisfaction. Procedure Code(s):        --- Professional ---                           [phone], 52, Colonoscopy, flexible; with directed                            submucosal injection(s), any substance                           [id number], 70, Colonoscopy, flexible; with biopsy,                            single or multiple Diagnosis Code(s):        --- Professional ---                           K64.8, Other hemorrhoids                           D49.0, Neoplasm of unspecified behavior of                            digestive system                           K56.690, Other partial intestinal obstruction                           R93.3, Abnormal findings on diagnostic imaging of                            other parts of digestive tract CPT copyright 2019 American [employer]. All rights reserved. The codes documented in this report are preliminary and upon coder review may  be revised to meet current compliance requirements. Thornton Park MD, MD 12/18/2019 1:38:10 PM This report has been signed  electronically. Number of Addenda: 0

## 2019-12-18 NOTE — Anesthesia Procedure Notes (Signed)
Procedure Name: MAC Performed by: Valda Favia, CRNA Pre-anesthesia Checklist: Patient identified, Emergency Drugs available, Patient being monitored, Suction available and Timeout performed Patient Re-evaluated:Patient Re-evaluated prior to induction Oxygen Delivery Method: Nasal cannula Induction Type: IV induction Airway Equipment and Method: Bite block Placement Confirmation: positive ETCO2 Dental Injury: Teeth and Oropharynx as per pre-operative assessment

## 2019-12-18 NOTE — Progress Notes (Signed)
Progress Note    Stacy Burns  [id number] DOB: 10/23/1945  DOA: 12/15/2019 PCP: Ferd Hibbs, NP    Brief Narrative:    Medical records reviewed and are as summarized below:  Stacy Burns is an 74 y.o. female past medical history that includes COVID-19 January of this year not hospitalized admitted to the hospital April 4 with symptomatic anemia.  She had felt weak and dizzy with shortness of breath went to her PCP who discovered a hemoglobin of 5.5.  She was instructed to come to the emergency department.  She was transfused 2 units packed red blood cells and evaluated by gastroenterology who recommended EGD and colonoscopy if needed.  Work-up also revealed occult GI bleed likely related to a malignancy.  Assessment/Plan:   Principal Problem:   Symptomatic anemia Active Problems:   Hypokalemia   Leukocytosis   Hepatic cirrhosis (HCC)   Metastatic disease (HCC)   GI bleed   Elevated blood pressure reading   Bilateral lower extremity edema   Abnormal EKG   Anemia  #1.  Symptomatic anemia/occult GI bleed likely related to malignancy.  Hemoglobin 8.3 this morning status post 2 units of packed red blood cells.  No melena no hematochezia.  FOBT positive.  Evaluated by gastroenterology recommend EGD and colonoscopy if needed.  Of note patient experienced ongoing nausea and frequent dry heaving episodes likely related to the prep.  Provided Zofran with some relief. -Monitor -Follow EGD results -Reglan -PPI.  #2.  Metastatic disease/cirrhosis.  Patient with unintentional weight loss in the setting of #1.  CT abdomen concerning for malignancy at accending colon/cecum with liver mets in addition to cirrhosis. Alk phos was elevated and total bilirubin 1.1.  Otherwise LFTs within the limits of normal -See #1 -Await GI recommendation -Consults as indicated  #3.  Hypokalemia.  Mild.  Resolved.  #4.  Leukocytosis.  Was mild initially and then improved today trending up.  May be  reactive.  He is afebrile hemodynamically stable and nontoxic-appearing -Monitor  #5.  Hypertension.  Blood pressure on the high end of normal this morning.  She was up most of the night vomiting/dry heaving and trying to manage nausea likely related to bowel prep.  No history of same -Monitor   Family Communication/Anticipated D/C date and plan/Code Status   DVT prophylaxis: . Code Status: Full Code.  Family Communication: patient with questions answered Disposition Plan: Status is: Inpatient  Remains inpatient appropriate because:Inpatient level of care appropriate due to severity of illness   Dispo: The patient is from: Home              Anticipated d/c is to: Home              Anticipated d/c date is: 1 day              Patient currently is not medically stable to d/c.          Medical Consultants:    GI   Anti-Infectives:    None  Subjective:    Complains of nausea through most of the night.  Describes several episodes of vomiting with mostly "dry heaving".  Patient had episode of dry heaving during my examination denies pain  Objective:    Vitals:   12/17/19 2359 12/18/19 0457 12/18/19 1123 12/18/19 1124  BP: (!) 177/85 (!) 178/82  125/75  Pulse: 98 93    Resp: _0 Temp: 98.3 F (36.8 C) 98.1 F (36.7 C)  98.9 F (37.2 C)  TempSrc: Oral Oral  Oral  SpO2: 94% 94%  99%  Weight:   79.4 kg   Height:   5' 7" (1.702 m)     Intake/Output Summary (Last 24 hours) at 12/18/2019 1230 Last data filed at 12/18/2019 1000 Gross per 24 hour  Intake 1495 ml  Output 2 ml  Net 1493 ml   Filed Weights   12/15/19 1800 12/18/19 1123  Weight: 82.6 kg 79.4 kg    Exam: General: Cooperative nauseated dry heaving  General: Regular rate and rhythm no murmur gallop or rub trace lower extremity edema Respiratory: No increased work of breathing breath sounds are clear bilaterally to auscultation I hear no wheeze no crackles Abdomen: Soft positive bowel sounds  throughout nontender to palpation no guarding or rebounding Musculoskeletal: Joints without swelling/erythema ambulates in room with steady gait Neuro: Awake alert oriented x3 speech clear facial symmetry moves all extremities  Data Reviewed:   I have personally reviewed following labs and imaging studies:  Labs: Labs show the following:   Basic Metabolic Panel: Recent Labs  Lab 12/15/19 1804 12/15/19 1804 12/16/19 0559 12/16/19 0559 12/17/19 0748 12/18/19 0113  NA 134*  --  137  --  136 134*  K 3.3*   < > 3.3*   < > 4.2 4.5  CL 103  --  105  --  107 102  CO2 18*  --  19*  --  21* 21*  GLUCOSE 102*  --  93  --  115* 160*  BUN 26*  --  20  --  12 10  CREATININE 1.22*  --  0.97  --  1.01* 0.91  CALCIUM 8.7*  --  8.3*  --  8.5* 8.8*   < > = values in this interval not displayed.   GFR Estimated Creatinine Clearance: 58.8 mL/min (by C-G formula based on SCr of 0.91 mg/dL). Liver Function Tests: Recent Labs  Lab 12/15/19 1804 12/16/19 0559  AST 44* 41  ALT 31 28  ALKPHOS 192* 168*  BILITOT 0.5 1.1  PROT 6.7 5.7*  ALBUMIN 3.0* 2.6*   Recent Labs  Lab 12/15/19 1804  LIPASE 40   No results for input(s): AMMONIA in the last 168 hours. Coagulation profile No results for input(s): INR, PROTIME in the last 168 hours.  CBC: Recent Labs  Lab 12/15/19 1804 12/16/19 0559 12/16/19 1629 12/17/19 0748 12/18/19 0113  WBC 13.1* 11.2*  --  11.8* 16.1*  NEUTROABS  --  8.8*  --   --   --   HGB 5.4* 7.3* 7.7* 7.6* 8.3*  HCT 20.5* 24.7* 26.9* 26.6* 27.9*  MCV 66.3* 71.2*  --  72.1* 70.5*  PLT 512* 494*  --  510* 533*   Cardiac Enzymes: No results for input(s): CKTOTAL, CKMB, CKMBINDEX, TROPONINI in the last 168 hours. BNP (last 3 results) No results for input(s): PROBNP in the last 8760 hours. CBG: No results for input(s): GLUCAP in the last 168 hours. D-Dimer: No results for input(s): DDIMER in the last 72 hours. Hgb A1c: No results for input(s): HGBA1C in the last  72 hours. Lipid Profile: No results for input(s): CHOL, HDL, LDLCALC, TRIG, CHOLHDL, LDLDIRECT in the last 72 hours. Thyroid function studies: No results for input(s): TSH, T4TOTAL, T3FREE, THYROIDAB in the last 72 hours.  Invalid input(s): FREET3 Anemia work up: No results for input(s): VITAMINB12, FOLATE, FERRITIN, TIBC, IRON, RETICCTPCT in the last 72 hours. Sepsis Labs: Recent Labs  Lab 12/15/19 1804 12/16/19   0559 12/17/19 0748 12/18/19 0113  WBC 13.1* 11.2* 11.8* 16.1*    Microbiology Recent Results (from the past 240 hour(s))  Respiratory Panel by RT PCR (Flu A&B, Covid) - Nasopharyngeal Swab     Status: None   Collection Time: 12/15/19 10:07 PM   Specimen: Nasopharyngeal Swab  Result Value Ref Range Status   SARS Coronavirus 2 by RT PCR NEGATIVE NEGATIVE Final    Comment: (NOTE) SARS-CoV-2 target nucleic acids are NOT DETECTED. The SARS-CoV-2 RNA is generally detectable in upper respiratoy specimens during the acute phase of infection. The lowest concentration of SARS-CoV-2 viral copies this assay can detect is 131 copies/mL. A negative result does not preclude SARS-Cov-2 infection and should not be used as the sole basis for treatment or other patient management decisions. A negative result may occur with  improper specimen collection/handling, submission of specimen other than nasopharyngeal swab, presence of viral mutation(s) within the areas targeted by this assay, and inadequate number of viral copies (<131 copies/mL). A negative result must be combined with clinical observations, patient history, and epidemiological information. The expected result is Negative. Fact Sheet for Patients:  PinkCheek.be Fact Sheet for Healthcare Providers:  GravelBags.it This test is not yet ap proved or cleared by the [place] FDA and  has been authorized for detection and/or diagnosis of SARS-CoV-2 by FDA under an  Emergency Use Authorization (EUA). This EUA will remain  in effect (meaning this test can be used) for the duration of the COVID-19 declaration under Section 564(b)(1) of the Act, 21 U.S.C. section 360bbb-3(b)(1), unless the authorization is terminated or revoked sooner.    Influenza A by PCR NEGATIVE NEGATIVE Final   Influenza B by PCR NEGATIVE NEGATIVE Final    Comment: (NOTE) The Xpert Xpress SARS-CoV-2/FLU/RSV assay is intended as an aid in  the diagnosis of influenza from Nasopharyngeal swab specimens and  should not be used as a sole basis for treatment. Nasal washings and  aspirates are unacceptable for Xpert Xpress SARS-CoV-2/FLU/RSV  testing. Fact Sheet for Patients: PinkCheek.be Fact Sheet for Healthcare Providers: GravelBags.it This test is not yet approved or cleared by the [place] FDA and  has been authorized for detection and/or diagnosis of SARS-CoV-2 by  FDA under an Emergency Use Authorization (EUA). This EUA will remain  in effect (meaning this test can be used) for the duration of the  Covid-19 declaration under Section 564(b)(1) of the Act, 21  U.S.C. section 360bbb-3(b)(1), unless the authorization is  terminated or revoked. Performed at [facility], [place] [address] [place], [place] [address]     Procedures and diagnostic studies:  CT ABDOMEN PELVIS WO CONTRAST  Result Date: 12/16/2019 CLINICAL DATA:  74 year old female with anemia. Weight loss. Evaluate for malignancy. EXAM: CT ABDOMEN AND PELVIS WITHOUT CONTRAST TECHNIQUE: Multidetector CT imaging of the abdomen and pelvis was performed following the standard protocol without IV contrast. COMPARISON:  None. FINDINGS: Evaluation of this exam is limited in the absence of intravenous contrast. Lower chest: There is a 5 mm left lower lobe calcified granuloma. The visualized lung bases are otherwise clear. No intra-abdominal free air or free  fluid. Hepatobiliary: Cirrhosis. Multiple hepatic hypodense lesions measuring up to approximately 3.5 cm consistent with metastatic disease. No intrahepatic biliary ductal dilatation. The gallbladder is unremarkable. Pancreas: Unremarkable. No pancreatic ductal dilatation or surrounding inflammatory changes. Spleen: Normal in size without focal abnormality. Adrenals/Urinary Tract: The adrenal glands are unremarkable. The kidneys, and visualized ureters appear unremarkable. The urinary bladder is collapsed.  Stomach/Bowel: There is an area of thickening of the proximal colon involving the cecum and ascending colon with probable degree of stricture just above the ileocecal valve most consistent with malignancy. There is stranding and haziness of the adjacent fat with several nodular density adjacent to the cecum consistent with metastatic disease. The largest pericecal nodule or confluent of nodule medial to the cecum measures 20 x 17 mm (49/3). There is a small hiatal hernia. There is no bowel obstruction. The appendix is normal. Vascular/Lymphatic: Advanced aortoiliac atherosclerotic disease. The IVC is unremarkable. No portal venous gas. There is no adenopathy. Reproductive: The uterus and ovaries are grossly unremarkable. Other: None Musculoskeletal: None mild degenerative changes. No acute osseous pathology. IMPRESSION: 1. Focal area of wall thickening and nodularity involving the cecum and ascending colon with probable degree of stricture just above the ileocecal valve most consistent with malignancy. Further evaluation with colonoscopy is recommended. 2. Cirrhosis with innumerable hepatic metastatic disease. 3. No bowel obstruction. Normal appendix. 4. Aortic Atherosclerosis (ICD10-I70.0). Electronically Signed   By: Anner Crete M.D.   On: 12/16/2019 22:23    Medications:   . [MAR Hold] metoCLOPramide (REGLAN) injection  10 mg Intravenous Once  . [MAR Hold] pantoprazole  40 mg Oral Daily    Continuous Infusions: . dextrose 5% lactated ringers with KCl 20 mEq/L 50 mL/hr at 12/18/19 2778  . lactated ringers       LOS: 1 day   Radene Gunning NP  [facility]   How to contact the [facility] Attending or Consulting provider [place] or covering provider during after hours [place], for this patient?  1. Check the care team in [facility] and look for a) attending/consulting [facility] provider listed and b) the [facility] team listed 2. Log into [email or web address] and use [place]'s universal password to access. If you do not have the password, please contact the hospital operator. 3. Locate the [facility] provider you are looking for under [facility] and page to a number that you can be directly reached. 4. If you still have difficulty reaching the provider, please page the [facility] (Director on Call) for the Hospitalists listed on amion for assistance.  12/18/2019, 12:30 PM

## 2019-12-18 NOTE — Consult Note (Addendum)
[facility]  [phone] Fax:(336) [place]  Referral MD: Fuller Canada  Reason for Referral: Colon mass with liver lesions  HPI: Ms. Yera is a 74 year old female with a past medical history significant for COVID-19 infection requiring hospitalization in January 2021.  The patient was seen by her primary care provider on the day of admission due to persistent weakness and dyspnea since January following her COVID-19 infection.  She was referred to the emergency room by her PCP due to low hemoglobin of 5.  The patient had been having intermittent right upper quadrant and epigastric pain.  She also had a weight loss of about 35 pounds since her diagnosis of COVID-19.  CBC from admission showed a WBC of 13.1, hemoglobin 5.4, MCV 66.3, platelet count 512,000.  She has received 2 units PRBCs this admission.  CMET showed a BUN of 26, creatinine 1.22, albumin 3.0, AST 44, alkaline phosphatase 192.  Stool for occult blood was positive.  A CT of the abdomen pelvis performed on 12/16/2019 showed a focal area of wall thickening and nodularity involving the cecum and ascending colon with probable degree of stricture just above the ileocecal valve most consistent with malignancy, cirrhosis with innumerable hepatic metastatic disease, no bowel obstruction.  The patient was seen by gastroenterology and underwent an upper endoscopy on 12/18/2019 which was normal.  She also had a colonoscopy performed on 12/18/2019 which showed an ulcerated partially obstructing large mass in the mid ascending colon which was biopsied.  Daughter is at the bedside today. The patient tolerated her EGD and colonoscopy well. Reports improvement in her weakness and dyspnea since blood transfusion.  The patient reports that her appetite has been fair but she has lost with 35 pounds since January 2021.  She has not had any dizziness.  She has not noticed any change in her stools.   Denies fevers and chills.  Denies chest pain.  Denies abdominal pain, nausea, vomiting.  She has not noticed any bleeding such as melena or hematochezia.  No lower extremity edema noted.  She has never had a colonoscopy and has not really seen a doctor for routine health maintenance in about 40 years.  The patient is widowed.  She has 2 children and is currently living with her son.  Reports that she previously smoked 1/2 pack of cigarettes per day x25 years.  She quit 1986.  Denies history of alcohol use.  The patient reports that she has been independent in all of her ADLs prior to admission. Medical oncology was asked see the patient for recommendations regarding her colon mass and liver lesions.    Past Medical History:  Diagnosis Date  . COVID-19 virus infection 08/2019   not hospitalized.    . Severe anemia 12/17/2019  :  Past Surgical History:  Procedure Laterality Date  . TUBAL LIGATION    :  Current Facility-Administered Medications  Medication Dose Route Frequency Provider Last Rate Last Admin  . acetaminophen (TYLENOL) tablet 650 mg  650 mg Oral Q6H PRN Reubin Milan, MD       Or  . acetaminophen (TYLENOL) suppository 650 mg  650 mg Rectal Q6H PRN Reubin Milan, MD      . ALPRAZolam Duanne Moron) tablet 0.25 mg  0.25 mg Oral BID PRN Radene Gunning, NP      . metoCLOPramide (REGLAN) injection 10 mg  10 mg Intravenous Once Vena Rua, PA-C      .  ondansetron (ZOFRAN) tablet 4 mg  4 mg Oral Q6H PRN Reubin Milan, MD       Or  . ondansetron [facility]) injection 4 mg  4 mg Intravenous Q6H PRN Reubin Milan, MD   4 mg at 12/18/19 5631  . pantoprazole (PROTONIX) EC tablet 40 mg  40 mg Oral Daily Vena Rua, PA-C   40 mg at 12/17/19 4970     Allergies  Allergen Reactions  . Milk-Related Compounds Nausea Only and Other (See Comments)    Delayed reaction if too much consumed   . Penicillins Anaphylaxis and Other (See Comments)    Both parents had  anaphylactic reactions to this  . Pineapple Swelling and Other (See Comments)    Tongue swells and causes acid bumps there, also  . Chocolate Other (See Comments)    Nasal congestion  . Other Itching, Rash and Other (See Comments)    "Most all antibiotics and OTC cold meds break me out" Allergic to ALL NUTS, also = Itching  . Sulfa Antibiotics Rash and Other (See Comments)    Severe rash  :  Family History  Problem Relation Age of Onset  . Multiple myeloma Mother   . Diabetes Mellitus II Mother   . Leukemia Father   . Muscular dystrophy Brother   :  Social History   Socioeconomic History  . Marital status: Married    Spouse name: Not on file  . Number of children: Not on file  . Years of education: Not on file  . Highest education level: Not on file  Occupational History  . Not on file  Tobacco Use  . Smoking status: Former Smoker    Years: 25.00    Types: Cigarettes    Quit date: 08/14/1984    Years since quitting: 35.3  . Smokeless tobacco: Never Used  Substance and Sexual Activity  . Alcohol use: Not Currently  . Drug use: Never  . Sexual activity: Not on file  Other Topics Concern  . Not on file  Social History Narrative  . Not on file   Social Determinants of Health   Financial Resource Strain:   . Difficulty of Paying Living Expenses:   Food Insecurity:   . Worried About Charity fundraiser in the Last Year:   . Arboriculturist in the Last Year:   Transportation Needs:   . Film/video editor (Medical):   Marland Kitchen Lack of Transportation (Non-Medical):   Physical Activity:   . Days of Exercise per Week:   . Minutes of Exercise per Session:   Stress:   . Feeling of Stress :   Social Connections:   . Frequency of Communication with Friends and Family:   . Frequency of Social Gatherings with Friends and Family:   . Attends Religious Services:   . Active Member of Clubs or Organizations:   . Attends Archivist Meetings:   Marland Kitchen Marital Status:    Intimate Partner Violence:   . Fear of Current or Ex-Partner:   . Emotionally Abused:   Marland Kitchen Physically Abused:   . Sexually Abused:   :  Review of Systems: A comprehensive 14 point review of systems was negative except as noted in the HPI.  Exam: Patient Vitals for the past 24 hrs:  BP Temp Temp src Pulse Resp SpO2 Height Weight  12/18/19 1345 (!) 123/44 -- -- 69 15 98 % -- --  12/18/19 1335 (!) 119/45 -- -- 67 16 98 % -- --  12/18/19 1325 (!) 100/36 -- -- 72 15 99 % -- --  12/18/19 1315 (!) 100/32 (!) 97 F (36.1 C) Temporal 69 (!) 22 100 % -- --  12/18/19 1124 125/75 98.9 F (37.2 C) Oral -- 17 99 % -- --  12/18/19 1123 -- -- -- -- -- -- 5' 7" (1.702 m) 79.4 kg  12/18/19 0457 (!) 178/82 98.1 F (36.7 C) Oral 93 18 94 % -- --  12/17/19 2359 (!) 177/85 98.3 F (36.8 C) Oral 98 18 94 % -- --  12/17/19 1735 (!) 186/75 97.8 F (36.6 C) -- 86 18 96 % -- --    General:  well-nourished in no acute distress.   Eyes:  no scleral icterus.   ENT:  There were no oropharyngeal lesions.   Neck was without thyromegaly.   Lymphatics:  Negative cervical, supraclavicular or axillary adenopathy.   Respiratory: lungs were clear bilaterally without wheezing or crackles.   Cardiovascular:  Regular rate and rhythm, S1/S2, without murmur, rub or gallop.  There was no pedal edema.   GI:  abdomen was soft, flat, nontender, nondistended, without organomegaly.   Musculoskeletal:  no spinal tenderness of palpation of vertebral spine.   Skin exam was without echymosis, petichae.   Neuro exam was nonfocal. Patient was alert and oriented.  Attention was good.   Language was appropriate.  Mood was normal without depression.  Speech was not pressured.  Thought content was not tangential.     Lab Results  Component Value Date   WBC 16.1 (H) 12/18/2019   HGB 8.3 (L) 12/18/2019   HCT 27.9 (L) 12/18/2019   PLT 533 (H) 12/18/2019   GLUCOSE 160 (H) 12/18/2019   ALT 28 12/16/2019   AST 41 12/16/2019   NA  134 (L) 12/18/2019   K 4.5 12/18/2019   CL 102 12/18/2019   CREATININE 0.91 12/18/2019   BUN 10 12/18/2019   CO2 21 (L) 12/18/2019    CT ABDOMEN PELVIS WO CONTRAST  Result Date: 12/16/2019 CLINICAL DATA:  74 year old female with anemia. Weight loss. Evaluate for malignancy. EXAM: CT ABDOMEN AND PELVIS WITHOUT CONTRAST TECHNIQUE: Multidetector CT imaging of the abdomen and pelvis was performed following the standard protocol without IV contrast. COMPARISON:  None. FINDINGS: Evaluation of this exam is limited in the absence of intravenous contrast. Lower chest: There is a 5 mm left lower lobe calcified granuloma. The visualized lung bases are otherwise clear. No intra-abdominal free air or free fluid. Hepatobiliary: Cirrhosis. Multiple hepatic hypodense lesions measuring up to approximately 3.5 cm consistent with metastatic disease. No intrahepatic biliary ductal dilatation. The gallbladder is unremarkable. Pancreas: Unremarkable. No pancreatic ductal dilatation or surrounding inflammatory changes. Spleen: Normal in size without focal abnormality. Adrenals/Urinary Tract: The adrenal glands are unremarkable. The kidneys, and visualized ureters appear unremarkable. The urinary bladder is collapsed. Stomach/Bowel: There is an area of thickening of the proximal colon involving the cecum and ascending colon with probable degree of stricture just above the ileocecal valve most consistent with malignancy. There is stranding and haziness of the adjacent fat with several nodular density adjacent to the cecum consistent with metastatic disease. The largest pericecal nodule or confluent of nodule medial to the cecum measures 20 x 17 mm (49/3). There is a small hiatal hernia. There is no bowel obstruction. The appendix is normal. Vascular/Lymphatic: Advanced aortoiliac atherosclerotic disease. The IVC is unremarkable. No portal venous gas. There is no adenopathy. Reproductive: The uterus and ovaries are grossly  unremarkable. Other: None Musculoskeletal: None  mild degenerative changes. No acute osseous pathology. IMPRESSION: 1. Focal area of wall thickening and nodularity involving the cecum and ascending colon with probable degree of stricture just above the ileocecal valve most consistent with malignancy. Further evaluation with colonoscopy is recommended. 2. Cirrhosis with innumerable hepatic metastatic disease. 3. No bowel obstruction. Normal appendix. 4. Aortic Atherosclerosis (ICD10-I70.0). Electronically Signed   By: Anner Crete M.D.   On: 12/16/2019 22:23   DG Chest 2 View  Result Date: 12/12/2019 CLINICAL DATA:  History of COVID-19 positivity in January of 2021 with increasing shortness of breath EXAM: CHEST - 2 VIEW COMPARISON:  12/25/2013 FINDINGS: Cardiac shadow is within normal limits. Calcified granuloma is noted in left lung base. No sizable infiltrate or effusion is seen. No bony abnormality is noted. IMPRESSION: No active cardiopulmonary disease. Electronically Signed   By: Inez Catalina M.D.   On: 12/12/2019 20:36   ECHOCARDIOGRAM COMPLETE  Result Date: 12/16/2019    ECHOCARDIOGRAM REPORT   Patient Name:   SINDI BECKWORTH Date of Exam: 12/16/2019 Medical Rec #:  [id number]    Height:       67.0 in Accession #:    [id number]   Weight:       182.0 lb Date of Birth:  05-28-1946     BSA:          1.943 m Patient Age:    4 years     BP:           136/68 mmHg Patient Gender: F            HR:           79 bpm. Exam Location:  Inpatient Procedure: 2D Echo, Color Doppler and Cardiac Doppler Indications:    R06.9 DOE  History:        Patient has no prior history of Echocardiogram examinations.                 COVID+ in Jan 2021.  Sonographer:    Raquel Sarna Senior RDCS Referring Phys: [id number] [place]  Sonographer Comments: Suboptimal parasternal window. IMPRESSIONS  1. Left ventricular ejection fraction, by estimation, is 60 to 65%. The left ventricle has normal function. The left ventricle has no regional  wall motion abnormalities. There is mild concentric left ventricular hypertrophy. Left ventricular diastolic parameters are indeterminate.  2. Right ventricular systolic function is normal. The right ventricular size is normal.  3. The mitral valve is normal in structure. Trivial mitral valve regurgitation. No evidence of mitral stenosis.  4. The aortic valve was not well visualized. Aortic valve regurgitation is not visualized. No aortic stenosis is present.  5. The inferior vena cava is dilated in size with >50% respiratory variability, suggesting right atrial pressure of 8 mmHg. Comparison(s): No prior Echocardiogram. Conclusion(s)/Recommendation(s): Normal biventricular function without evidence of hemodynamically significant valvular heart disease. FINDINGS  Left Ventricle: Left ventricular ejection fraction, by estimation, is 60 to 65%. The left ventricle has normal function. The left ventricle has no regional wall motion abnormalities. The left ventricular internal cavity size was normal in size. There is  mild concentric left ventricular hypertrophy. Left ventricular diastolic parameters are indeterminate. Right Ventricle: The right ventricular size is normal. No increase in right ventricular wall thickness. Right ventricular systolic function is normal. Left Atrium: Left atrial size was normal in size. Right Atrium: Right atrial size was normal in size. Pericardium: There is no evidence of pericardial effusion. Mitral Valve: The mitral valve is normal in structure.  Moderate mitral annular calcification. Trivial mitral valve regurgitation. No evidence of mitral valve stenosis. Tricuspid Valve: The tricuspid valve is normal in structure. Tricuspid valve regurgitation is trivial. No evidence of tricuspid stenosis. Aortic Valve: The aortic valve was not well visualized. Aortic valve regurgitation is not visualized. No aortic stenosis is present. Pulmonic Valve: The pulmonic valve was not well visualized.  Pulmonic valve regurgitation is not visualized. No evidence of pulmonic stenosis. Aorta: The aortic root was not well visualized and the ascending aorta was not well visualized. Pulmonary Artery: The pulmonary artery is not well seen. Venous: The inferior vena cava is dilated in size with greater than 50% respiratory variability, suggesting right atrial pressure of 8 mmHg. IAS/Shunts: The atrial septum is grossly normal.  LEFT VENTRICLE PLAX 2D LVIDd:         4.37 cm  Diastology LVIDs:         2.83 cm  LV e' lateral:   9.03 cm/s LV PW:         1.32 cm  LV E/e' lateral: 10.5 LV IVS:        1.25 cm  LV e' medial:    6.96 cm/s LVOT diam:     2.10 cm  LV E/e' medial:  13.6 LV SV:         74 LV SV Index:   38 LVOT Area:     3.46 cm  RIGHT VENTRICLE RV S prime:     12.20 cm/s TAPSE (M-mode): 2.0 cm LEFT ATRIUM             Index       RIGHT ATRIUM           Index LA diam:        3.70 cm 1.90 cm/m  RA Area:     11.70 cm LA Vol (A2C):   61.9 ml 31.86 ml/m RA Volume:   24.00 ml  12.35 ml/m LA Vol (A4C):   51.2 ml 26.35 ml/m LA Biplane Vol: 55.9 ml 28.77 ml/m  AORTIC VALVE LVOT Vmax:   112.00 cm/s LVOT Vmean:  74.300 cm/s LVOT VTI:    0.214 m  AORTA Ao Root diam: 3.60 cm Ao Asc diam:  3.30 cm MITRAL VALVE MV Area (PHT): 2.76 cm    SHUNTS MV Decel Time: 275 msec    Systemic VTI:  0.21 m MV E velocity: 94.60 cm/s  Systemic Diam: 2.10 cm MV A velocity: 97.50 cm/s MV E/A ratio:  0.97 Buford Dresser MD Electronically signed by Buford Dresser MD Signature Date/Time: 12/16/2019/2:19:48 PM    Final      CT ABDOMEN PELVIS WO CONTRAST  Result Date: 12/16/2019 CLINICAL DATA:  74 year old female with anemia. Weight loss. Evaluate for malignancy. EXAM: CT ABDOMEN AND PELVIS WITHOUT CONTRAST TECHNIQUE: Multidetector CT imaging of the abdomen and pelvis was performed following the standard protocol without IV contrast. COMPARISON:  None. FINDINGS: Evaluation of this exam is limited in the absence of intravenous  contrast. Lower chest: There is a 5 mm left lower lobe calcified granuloma. The visualized lung bases are otherwise clear. No intra-abdominal free air or free fluid. Hepatobiliary: Cirrhosis. Multiple hepatic hypodense lesions measuring up to approximately 3.5 cm consistent with metastatic disease. No intrahepatic biliary ductal dilatation. The gallbladder is unremarkable. Pancreas: Unremarkable. No pancreatic ductal dilatation or surrounding inflammatory changes. Spleen: Normal in size without focal abnormality. Adrenals/Urinary Tract: The adrenal glands are unremarkable. The kidneys, and visualized ureters appear unremarkable. The urinary bladder is collapsed. Stomach/Bowel: There is  an area of thickening of the proximal colon involving the cecum and ascending colon with probable degree of stricture just above the ileocecal valve most consistent with malignancy. There is stranding and haziness of the adjacent fat with several nodular density adjacent to the cecum consistent with metastatic disease. The largest pericecal nodule or confluent of nodule medial to the cecum measures 20 x 17 mm (49/3). There is a small hiatal hernia. There is no bowel obstruction. The appendix is normal. Vascular/Lymphatic: Advanced aortoiliac atherosclerotic disease. The IVC is unremarkable. No portal venous gas. There is no adenopathy. Reproductive: The uterus and ovaries are grossly unremarkable. Other: None Musculoskeletal: None mild degenerative changes. No acute osseous pathology. IMPRESSION: 1. Focal area of wall thickening and nodularity involving the cecum and ascending colon with probable degree of stricture just above the ileocecal valve most consistent with malignancy. Further evaluation with colonoscopy is recommended. 2. Cirrhosis with innumerable hepatic metastatic disease. 3. No bowel obstruction. Normal appendix. 4. Aortic Atherosclerosis (ICD10-I70.0). Electronically Signed   By: Anner Crete M.D.   On: 12/16/2019  22:23   DG Chest 2 View  Result Date: 12/12/2019 CLINICAL DATA:  History of COVID-19 positivity in January of 2021 with increasing shortness of breath EXAM: CHEST - 2 VIEW COMPARISON:  12/25/2013 FINDINGS: Cardiac shadow is within normal limits. Calcified granuloma is noted in left lung base. No sizable infiltrate or effusion is seen. No bony abnormality is noted. IMPRESSION: No active cardiopulmonary disease. Electronically Signed   By: Inez Catalina M.D.   On: 12/12/2019 20:36   ECHOCARDIOGRAM COMPLETE  Result Date: 12/16/2019    ECHOCARDIOGRAM REPORT   Patient Name:   ELASIA FURNISH Date of Exam: 12/16/2019 Medical Rec #:  [id number]    Height:       67.0 in Accession #:    [id number]   Weight:       182.0 lb Date of Birth:  01-24-1946     BSA:          1.943 m Patient Age:    35 years     BP:           136/68 mmHg Patient Gender: F            HR:           79 bpm. Exam Location:  Inpatient Procedure: 2D Echo, Color Doppler and Cardiac Doppler Indications:    R06.9 DOE  History:        Patient has no prior history of Echocardiogram examinations.                 COVID+ in Jan 2021.  Sonographer:    Raquel Sarna Senior RDCS Referring Phys: [id number] [place]  Sonographer Comments: Suboptimal parasternal window. IMPRESSIONS  1. Left ventricular ejection fraction, by estimation, is 60 to 65%. The left ventricle has normal function. The left ventricle has no regional wall motion abnormalities. There is mild concentric left ventricular hypertrophy. Left ventricular diastolic parameters are indeterminate.  2. Right ventricular systolic function is normal. The right ventricular size is normal.  3. The mitral valve is normal in structure. Trivial mitral valve regurgitation. No evidence of mitral stenosis.  4. The aortic valve was not well visualized. Aortic valve regurgitation is not visualized. No aortic stenosis is present.  5. The inferior vena cava is dilated in size with >50% respiratory variability, suggesting  right atrial pressure of 8 mmHg. Comparison(s): No prior Echocardiogram. Conclusion(s)/Recommendation(s): Normal biventricular function without evidence of hemodynamically significant  valvular heart disease. FINDINGS  Left Ventricle: Left ventricular ejection fraction, by estimation, is 60 to 65%. The left ventricle has normal function. The left ventricle has no regional wall motion abnormalities. The left ventricular internal cavity size was normal in size. There is  mild concentric left ventricular hypertrophy. Left ventricular diastolic parameters are indeterminate. Right Ventricle: The right ventricular size is normal. No increase in right ventricular wall thickness. Right ventricular systolic function is normal. Left Atrium: Left atrial size was normal in size. Right Atrium: Right atrial size was normal in size. Pericardium: There is no evidence of pericardial effusion. Mitral Valve: The mitral valve is normal in structure. Moderate mitral annular calcification. Trivial mitral valve regurgitation. No evidence of mitral valve stenosis. Tricuspid Valve: The tricuspid valve is normal in structure. Tricuspid valve regurgitation is trivial. No evidence of tricuspid stenosis. Aortic Valve: The aortic valve was not well visualized. Aortic valve regurgitation is not visualized. No aortic stenosis is present. Pulmonic Valve: The pulmonic valve was not well visualized. Pulmonic valve regurgitation is not visualized. No evidence of pulmonic stenosis. Aorta: The aortic root was not well visualized and the ascending aorta was not well visualized. Pulmonary Artery: The pulmonary artery is not well seen. Venous: The inferior vena cava is dilated in size with greater than 50% respiratory variability, suggesting right atrial pressure of 8 mmHg. IAS/Shunts: The atrial septum is grossly normal.  LEFT VENTRICLE PLAX 2D LVIDd:         4.37 cm  Diastology LVIDs:         2.83 cm  LV e' lateral:   9.03 cm/s LV PW:         1.32 cm  LV  E/e' lateral: 10.5 LV IVS:        1.25 cm  LV e' medial:    6.96 cm/s LVOT diam:     2.10 cm  LV E/e' medial:  13.6 LV SV:         74 LV SV Index:   38 LVOT Area:     3.46 cm  RIGHT VENTRICLE RV S prime:     12.20 cm/s TAPSE (M-mode): 2.0 cm LEFT ATRIUM             Index       RIGHT ATRIUM           Index LA diam:        3.70 cm 1.90 cm/m  RA Area:     11.70 cm LA Vol (A2C):   61.9 ml 31.86 ml/m RA Volume:   24.00 ml  12.35 ml/m LA Vol (A4C):   51.2 ml 26.35 ml/m LA Biplane Vol: 55.9 ml 28.77 ml/m  AORTIC VALVE LVOT Vmax:   112.00 cm/s LVOT Vmean:  74.300 cm/s LVOT VTI:    0.214 m  AORTA Ao Root diam: 3.60 cm Ao Asc diam:  3.30 cm MITRAL VALVE MV Area (PHT): 2.76 cm    SHUNTS MV Decel Time: 275 msec    Systemic VTI:  0.21 m MV E velocity: 94.60 cm/s  Systemic Diam: 2.10 cm MV A velocity: 97.50 cm/s MV E/A ratio:  0.97 Buford Dresser MD Electronically signed by Buford Dresser MD Signature Date/Time: 12/16/2019/2:19:48 PM    Final    Assessment and Plan: 1.  Colon mass with liver lesions 2.  Anemia likely iron deficiency secondary to GI mass 3.  Cirrhosis  Ms. Bradby is a 73 year old female now admitted to the hospital due to symptomatic anemia.  She was found  to have heme positive stools and CT scan showed thickening of the cecum, cirrhosis, and liver lesions concerning for metastatic colon cancer.  She underwent EGD and colonoscopy earlier today which showed a partially obstructing mass in the ascending colon.  Biopsy obtained and results are pending.  Anemia has improved following PRBC transfusion.  Discussed CT findings and findings from colonoscopy with the patient and her daughter and we discussed this is concerning for metastatic colon cancer.  Await biopsy results.  Recommendations: 1.  Await biopsy results. 2.  We will request ultrasound-guided biopsy of one of her liver lesions to confirm metastatic disease and to obtain tissue to send for molecular testing. 3.  Add CEA onto  lab work. 4.  Discussed with general surgery who will place a Port-A-Cath. 5.  We will start the patient on ferrous sulfate 325 mg twice daily for anemia. 6.  Monitor CBC closely and transfuse for hemoglobin less than 7.   Thank you for this referral.   Mikey Bussing, DNP, AGPCNP-BC, AOCNP  Ms. Pincock was interviewed and examined.  Her daughter was at the bedside.  I reviewed the CT images and colonoscopy report.  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  We will request biopsy of a liver lesion to confirm metastatic disease and obtain tissue for Foundation 1 testing.  Ms. Belfield understands no therapy will be curative if she is confirmed to have liver metastases.  I will recommend systemic therapy, likely with FOLFOX to begin within the next few weeks.  Outpatient follow-up will be scheduled at the cancer center for 12/26/2019.

## 2019-12-19 ENCOUNTER — Inpatient Hospital Stay (HOSPITAL_COMMUNITY): Payer: BC Managed Care – PPO | Admitting: Anesthesiology

## 2019-12-19 ENCOUNTER — Other Ambulatory Visit: Payer: Self-pay | Admitting: Oncology

## 2019-12-19 ENCOUNTER — Inpatient Hospital Stay (HOSPITAL_COMMUNITY): Payer: BC Managed Care – PPO

## 2019-12-19 ENCOUNTER — Encounter (HOSPITAL_COMMUNITY)
Admission: EM | Disposition: A | Discharge status: Home / Self Care | Payer: Self-pay | Source: Home / Self Care | Attending: Internal Medicine

## 2019-12-19 DIAGNOSIS — D649 Anemia, unspecified: Secondary | ICD-10-CM

## 2019-12-19 HISTORY — PX: PORTACATH PLACEMENT: SHX2246

## 2019-12-19 LAB — CBC
HCT: 27.2 % — ABNORMAL LOW (ref 36.0–46.0)
Hemoglobin: 7.6 g/dL — ABNORMAL LOW (ref 12.0–15.0)
MCH: 20.3 pg — ABNORMAL LOW (ref 26.0–34.0)
MCHC: 27.9 g/dL — ABNORMAL LOW (ref 30.0–36.0)
MCV: 72.7 fL — ABNORMAL LOW (ref 80.0–100.0)
Platelets: 475 10*3/uL — ABNORMAL HIGH (ref 150–400)
RBC: 3.74 MIL/uL — ABNORMAL LOW (ref 3.87–5.11)
RDW: 25.3 % — ABNORMAL HIGH (ref 11.5–15.5)
WBC: 12.6 10*3/uL — ABNORMAL HIGH (ref 4.0–10.5)
nRBC: 0 % (ref 0.0–0.2)

## 2019-12-19 LAB — BASIC METABOLIC PANEL
Anion gap: 10 (ref 5–15)
BUN: 18 mg/dL (ref 8–23)
CO2: 21 mmol/L — ABNORMAL LOW (ref 22–32)
Calcium: 8.7 mg/dL — ABNORMAL LOW (ref 8.9–10.3)
Chloride: 106 mmol/L (ref 98–111)
Creatinine, Ser: 1.21 mg/dL — ABNORMAL HIGH (ref 0.44–1.00)
GFR calc Af Amer: 51 mL/min — ABNORMAL LOW (ref >60)
GFR calc non Af Amer: 44 mL/min — ABNORMAL LOW (ref >60)
Glucose, Bld: 97 mg/dL (ref 70–99)
Potassium: 4.5 mmol/L (ref 3.5–5.1)
Sodium: 137 mmol/L (ref 135–145)

## 2019-12-19 SURGERY — INSERTION, TUNNELED CENTRAL VENOUS DEVICE, WITH PORT
Anesthesia: General | Site: Chest

## 2019-12-19 MED ORDER — ACETAMINOPHEN 160 MG/5ML PO SOLN: 1000.0000 mg | Freq: Once | ORAL | Status: DC | PRN

## 2019-12-19 MED ORDER — PROPOFOL 10 MG/ML IV BOLUS
INTRAVENOUS | Status: DC | PRN
  Administered 2019-12-19: 110 mg via INTRAVENOUS

## 2019-12-19 MED ORDER — ONDANSETRON HCL 4 MG/2ML IJ SOLN
INTRAMUSCULAR | Status: DC | PRN
  Administered 2019-12-19: 4 mg via INTRAVENOUS

## 2019-12-19 MED ORDER — SODIUM CHLORIDE 0.9 % IV SOLN
INTRAVENOUS | Status: AC
  Filled 2019-12-19: qty 1.2

## 2019-12-19 MED ORDER — LACTATED RINGERS IV SOLN: INTRAVENOUS | Status: DC

## 2019-12-19 MED ORDER — BUPIVACAINE HCL 0.25 % IJ SOLN
INTRAMUSCULAR | Status: DC | PRN
  Administered 2019-12-19: 6 mL

## 2019-12-19 MED ORDER — PHENYLEPHRINE 40 MCG/ML (10ML) SYRINGE FOR IV PUSH (FOR BLOOD PRESSURE SUPPORT)
PREFILLED_SYRINGE | INTRAVENOUS | Status: AC
  Filled 2019-12-19: qty 10

## 2019-12-19 MED ORDER — SODIUM CHLORIDE 0.9 % IV SOLN
INTRAVENOUS | Status: DC | PRN
  Administered 2019-12-19: 500 mL

## 2019-12-19 MED ORDER — LACTATED RINGERS IV SOLN: INTRAVENOUS | Status: DC | PRN

## 2019-12-19 MED ORDER — FENTANYL CITRATE (PF) 250 MCG/5ML IJ SOLN
INTRAMUSCULAR | Status: DC | PRN
  Administered 2019-12-19 (×4): 25 ug via INTRAVENOUS

## 2019-12-19 MED ORDER — LIDOCAINE HCL (CARDIAC) PF 100 MG/5ML IV SOSY
PREFILLED_SYRINGE | INTRAVENOUS | Status: DC | PRN
  Administered 2019-12-19: 80 mg via INTRATRACHEAL

## 2019-12-19 MED ORDER — HEPARIN SOD (PORK) LOCK FLUSH 100 UNIT/ML IV SOLN
INTRAVENOUS | Status: AC
  Filled 2019-12-19: qty 5

## 2019-12-19 MED ORDER — DEXAMETHASONE SODIUM PHOSPHATE 10 MG/ML IJ SOLN
INTRAMUSCULAR | Status: DC | PRN
  Administered 2019-12-19: 5 mg via INTRAVENOUS

## 2019-12-19 MED ORDER — OXYCODONE HCL 5 MG PO TABS: 5.0000 mg | ORAL_TABLET | Freq: Once | ORAL | Status: DC | PRN

## 2019-12-19 MED ORDER — PHENYLEPHRINE 40 MCG/ML (10ML) SYRINGE FOR IV PUSH (FOR BLOOD PRESSURE SUPPORT)
PREFILLED_SYRINGE | INTRAVENOUS | Status: DC | PRN
  Administered 2019-12-19: 120 ug via INTRAVENOUS
  Administered 2019-12-19 (×3): 80 ug via INTRAVENOUS

## 2019-12-19 MED ORDER — PROPOFOL 10 MG/ML IV BOLUS
INTRAVENOUS | Status: AC
  Filled 2019-12-19: qty 20

## 2019-12-19 MED ORDER — ACETAMINOPHEN 500 MG PO TABS: 1000.0000 mg | ORAL_TABLET | Freq: Once | ORAL | Status: DC | PRN

## 2019-12-19 MED ORDER — LIDOCAINE 2% (20 MG/ML) 5 ML SYRINGE
INTRAMUSCULAR | Status: AC
  Filled 2019-12-19: qty 5

## 2019-12-19 MED ORDER — CHLORHEXIDINE GLUCONATE CLOTH 2 % EX PADS
6.0000 | MEDICATED_PAD | Freq: Every day | CUTANEOUS | Status: DC
  Administered 2019-12-19 – 2019-12-20 (×2): 6 via TOPICAL

## 2019-12-19 MED ORDER — BUPIVACAINE HCL (PF) 0.25 % IJ SOLN
INTRAMUSCULAR | Status: AC
  Filled 2019-12-19: qty 30

## 2019-12-19 MED ORDER — ACETAMINOPHEN 10 MG/ML IV SOLN: 1000.0000 mg | Freq: Once | INTRAVENOUS | Status: DC | PRN

## 2019-12-19 MED ORDER — HEPARIN SOD (PORK) LOCK FLUSH 100 UNIT/ML IV SOLN
INTRAVENOUS | Status: DC | PRN
  Administered 2019-12-19: 400 [IU] via INTRAVENOUS

## 2019-12-19 MED ORDER — DEXAMETHASONE SODIUM PHOSPHATE 10 MG/ML IJ SOLN
INTRAMUSCULAR | Status: AC
  Filled 2019-12-19: qty 1

## 2019-12-19 MED ORDER — ONDANSETRON HCL 4 MG/2ML IJ SOLN
INTRAMUSCULAR | Status: AC
  Filled 2019-12-19: qty 2

## 2019-12-19 MED ORDER — FENTANYL CITRATE (PF) 100 MCG/2ML IJ SOLN: 25.0000 ug | INTRAMUSCULAR | Status: DC | PRN

## 2019-12-19 MED ORDER — FENTANYL CITRATE (PF) 250 MCG/5ML IJ SOLN
INTRAMUSCULAR | Status: AC
  Filled 2019-12-19: qty 5

## 2019-12-19 MED ORDER — 0.9 % SODIUM CHLORIDE (POUR BTL) OPTIME
TOPICAL | Status: DC | PRN
  Administered 2019-12-19: 1000 mL

## 2019-12-19 MED ORDER — OXYCODONE HCL 5 MG/5ML PO SOLN: 5.0000 mg | Freq: Once | ORAL | Status: DC | PRN

## 2019-12-19 SURGICAL SUPPLY — 43 items
BAG DECANTER FOR FLEXI CONT (MISCELLANEOUS) ×3 IMPLANT
CHLORAPREP W/TINT 10.5 ML (MISCELLANEOUS) ×3 IMPLANT
CLOSURE WOUND 1/2 X4 (GAUZE/BANDAGES/DRESSINGS) ×1
COVER SURGICAL LIGHT HANDLE (MISCELLANEOUS) ×3 IMPLANT
COVER TRANSDUCER ULTRASND GEL (DRAPE) ×3 IMPLANT
COVER WAND RF STERILE (DRAPES) ×3 IMPLANT
DECANTER SPIKE VIAL GLASS SM (MISCELLANEOUS) ×3 IMPLANT
DERMABOND ADVANCED (GAUZE/BANDAGES/DRESSINGS) ×2
DERMABOND ADVANCED .7 DNX12 (GAUZE/BANDAGES/DRESSINGS) ×1 IMPLANT
DRAPE C-ARM 42X120 X-RAY (DRAPES) ×3 IMPLANT
DRAPE CHEST BREAST 15X10 FENES (DRAPES) ×3 IMPLANT
ELECT CAUTERY BLADE 6.4 (BLADE) ×3 IMPLANT
ELECT REM PT RETURN 9FT ADLT (ELECTROSURGICAL) ×3
ELECTRODE REM PT RTRN 9FT ADLT (ELECTROSURGICAL) ×1 IMPLANT
GAUZE 4X4 16PLY RFD (DISPOSABLE) ×3 IMPLANT
GEL ULTRASOUND 20GR AQUASONIC (MISCELLANEOUS) ×3 IMPLANT
GLOVE BIO SURGEON STRL SZ7 (GLOVE) ×3 IMPLANT
GLOVE BIOGEL PI IND STRL 7.5 (GLOVE) ×1 IMPLANT
GLOVE BIOGEL PI INDICATOR 7.5 (GLOVE) ×2
GOWN STRL REUS W/ TWL LRG LVL3 (GOWN DISPOSABLE) ×2 IMPLANT
GOWN STRL REUS W/TWL LRG LVL3 (GOWN DISPOSABLE) ×6
INTRODUCER COOK 11FR (CATHETERS) IMPLANT
KIT BASIN OR (CUSTOM PROCEDURE TRAY) ×3 IMPLANT
KIT PORT POWER 8FR ISP CVUE (Port) ×3 IMPLANT
KIT TURNOVER KIT B (KITS) ×3 IMPLANT
NS IRRIG 1000ML POUR BTL (IV SOLUTION) ×3 IMPLANT
PAD ARMBOARD 7.5X6 YLW CONV (MISCELLANEOUS) ×6 IMPLANT
PENCIL BUTTON HOLSTER BLD 10FT (ELECTRODE) ×3 IMPLANT
POSITIONER HEAD DONUT 9IN (MISCELLANEOUS) ×3 IMPLANT
SET INTRODUCER 12FR PACEMAKER (INTRODUCER) IMPLANT
SET SHEATH INTRODUCER 10FR (MISCELLANEOUS) IMPLANT
SHEATH COOK PEEL AWAY SET 9F (SHEATH) IMPLANT
STRIP CLOSURE SKIN 1/2X4 (GAUZE/BANDAGES/DRESSINGS) ×1 IMPLANT
SUT MNCRL AB 4-0 PS2 18 (SUTURE) ×3 IMPLANT
SUT PROLENE 2 0 SH DA (SUTURE) ×3 IMPLANT
SUT SILK 2 0 (SUTURE)
SUT SILK 2-0 18XBRD TIE 12 (SUTURE) IMPLANT
SUT VIC AB 3-0 SH 27 (SUTURE) ×3
SUT VIC AB 3-0 SH 27XBRD (SUTURE) ×1 IMPLANT
SYR 5ML LUER SLIP (SYRINGE) ×3 IMPLANT
TOWEL GREEN STERILE (TOWEL DISPOSABLE) ×3 IMPLANT
TOWEL GREEN STERILE FF (TOWEL DISPOSABLE) ×3 IMPLANT
TRAY LAPAROSCOPIC MC (CUSTOM PROCEDURE TRAY) ×3 IMPLANT

## 2019-12-19 NOTE — Plan of Care (Signed)

## 2019-12-19 NOTE — Op Note (Signed)
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  Discussed port placement for systemic therapy.    Procedure:After informed consent was obtained the patient was taken to the operating room. She was given antibiotics. SCDs were placed. She was placed under general anesthesia without complication. She was prepped and draped in the standard sterile surgical fashion. A surgical timeout was then performed.  I identified the internal jugular vein on theright side with the ultrasound. I made a small nick in the skin. I accessed the internal jugular vein with the needle under ultrasound guidance. I passed the wire. The wire was confirmed to be in position with fluoroscopy.The wire was in the vein by ultrasound as well.I then infiltrated Marcaine below the clavicle on therightside. Imade anincision and developed a subcutaneous pocket for the port. I then tunneled between the port site as well as the insertion site. I brought the line through this. I then placed the dilator under fluoroscopic guidance over the wire. I removedthe wire andthe dilator. I then placed the line into the sheath. The sheath was then removed. I pulled the line back to be in the distal vena cava. I then hooked this up to the port. This was placed in the pocket and sutured in place with 2-0 Prolene suture. I then closed this with 3-0 Vicryl and 4-0 Monocryl. Glue was placed. It withdrew blood and flushed easily. I packed it with heparin.I placed a dressing on it as well.

## 2019-12-19 NOTE — Progress Notes (Signed)
OT Cancellation Note  Patient Details Name: Stacy Burns MRN: [id number] DOB: [birth year]   Cancelled Treatment:    Reason Eval/Treat Not Completed: Patient at procedure or test/ unavailable;Other (comment) Will check back as time allows for skilled OT session.   Lanier Clam., COTA/L Acute Rehabilitation Services [phone] [place] 12/19/2019, 1:07 PM

## 2019-12-19 NOTE — Progress Notes (Signed)
Spoke with IR, they are not able to get pt's liver biopsy done today, their schedule is already full for tomorrow, Saturday.  Plan is for Monday inpatient.  Spoke with Dr Eliseo Squires and updated her on IRs schedule of when they can get biopsy done.    Dr Eliseo Squires plans to keep pt over night post PAC placement and assess in the am and if doing well, will discharge pt home and get oncology to assist with scheduling liver biopsy as outpatient.  Pt and her daughter informed of plan, pt verbalizes understanding and agrees with plan.   . Daughter states she would like to have biopsy done on Monday or Tuesday while she is here in town. She has to leave Wednesday am and return late Thursday night, plan to go with pt to oncology appointment on Friday with hopes to already have biopsy report to have at this appointment.  If biopsy has to be done on Wed or Thurs, pt can find someone to take her to the procedure if needed.

## 2019-12-19 NOTE — Progress Notes (Signed)
Progress Note    Stacy Burns  [id number] DOB: [birth year]  DOA: 12/15/2019 PCP: Ferd Hibbs, NP    Brief Narrative:    Medical records reviewed and are as summarized below:  Stacy Burns is an 74 y.o. female with a past medical history that includes COVID-19 January of this year not hospital is admitted to hospital April 4 with symptomatic anemia.  Work-up reveals what is likely stage IV colon cancer with liver mets.  Scheduled to get Port-A-Cath and have liver lesion biopsy today.  Has been seen by general surgery and oncology.  Has an oncology follow-up appointment for May 14  Assessment/Plan:   Principal Problem:   Symptomatic anemia Active Problems:   Hypokalemia   Leukocytosis   Hepatic cirrhosis (HCC)   Metastatic disease (HCC)   GI bleed   Elevated blood pressure reading   Bilateral lower extremity edema   Abnormal EKG   Anemia   Colonic mass   #1.  Symptomatic anemia/occult GI bleed likely related to malignancy.  Hemoglobin 7.6 status post 2 units of packed red blood cells.  She was FOBT positive.  Underwent upper endoscopy which was normal.  Underwent colonoscopy yesterday revealing an ulcerative partially obstructing large colon mass which was biopsied. -Monitor -iron -transfuse for Hg 7 or less  #2.  Colonic mass/metastatic disease.  Underwent colonoscopy yesterday positive for ulcerated partially obstructing large mass in the mid ascending colon which was biopsied.  Day interventional radiology will biopsy liver lesions.  Today general surgery will put in Port-A-Cath.  She was evaluated by oncology who opined metastatic colon cancer.  He is recommending systemic therapy to begin in a few weeks. -follow CEA -per oncology  #3.  Hypertension.  Improved control.  No history of same. -Monitor  #4.  Leukocytosis.  WBCs trending down.  She has been afebrile hemodynamically stable and nontoxic-appearing.  No signs of infection. -Monitor  #5.  Hypokalemia.   Repleted and resolved   Family Communication/Anticipated D/C date and plan/Code Status   DVT prophylaxis:  Code Status: Full Code.  Family Communication: None at bedside all questions were answered Disposition Plan: Status is: Inpatient  Remains inpatient appropriate because:Inpatient level of care appropriate due to severity of illness   Dispo: The patient is from: Home              Anticipated d/c is to: Home              Anticipated d/c date is: 1 day              Patient currently is not medically stable to d/c.          Medical Consultants:    Donne Hazel general surgery  [facility] oncology   Anti-Infectives:    None  Subjective:   Lying in bed watching tv. Denies pain/discomfort. Complains of being "up and down all night" to bathroom still  Objective:    Vitals:   12/18/19 1345 12/18/19 1734 12/19/19 0003 12/19/19 0545  BP: (!) 123/44 128/65 (!) 151/64 (!) 147/69  Pulse: 69 84 90 85  Resp: 15 15 16 15   Temp:  98.2 F (36.8 C) 98.1 F (36.7 C) 98.3 F (36.8 C)  TempSrc:  Oral Oral Oral  SpO2: 98% 100% 97% 96%  Weight:      Height:        Intake/Output Summary (Last 24 hours) at 12/19/2019 1055 Last data filed at 12/19/2019 0300 Gross per 24 hour  Intake 880  ml  Output --  Net 880 ml   Filed Weights   12/15/19 1800 12/18/19 1123  Weight: 82.6 kg 79.4 kg    Exam: General: Well-nourished no acute distress CV: Regular rate and rhythm no murmur gallop or rub no lower extremity edema Respiratory: No increased work of breathing breath sounds are clear bilaterally hear no wheeze no rhonchi Abdomen obese soft positive bowel sounds throughout nontender palpation no mass organomegaly noted Musculoskeletal: Joints without swelling/erythema ambulates in room with steady gait Neuro: Alert and oriented x3 speech clear facial symmetry   Data Reviewed:   I have personally reviewed following labs and imaging studies:  Labs: Labs show the following:    Basic Metabolic Panel: Recent Labs  Lab 12/15/19 1804 12/15/19 1804 12/16/19 0559 12/16/19 0559 12/17/19 0748 12/17/19 0748 12/18/19 0113 12/19/19 0603  NA 134*  --  137  --  136  --  134* 137  K 3.3*   < > 3.3*   < > 4.2   < > 4.5 4.5  CL 103  --  105  --  107  --  102 106  CO2 18*  --  19*  --  21*  --  21* 21*  GLUCOSE 102*  --  93  --  115*  --  160* 97  BUN 26*  --  20  --  12  --  10 18  CREATININE 1.22*  --  0.97  --  1.01*  --  0.91 1.21*  CALCIUM 8.7*  --  8.3*  --  8.5*  --  8.8* 8.7*   < > = values in this interval not displayed.   GFR Estimated Creatinine Clearance: 44.2 mL/min (A) (by C-G formula based on SCr of 1.21 mg/dL (H)). Liver Function Tests: Recent Labs  Lab 12/15/19 1804 12/16/19 0559  AST 44* 41  ALT 31 28  ALKPHOS 192* 168*  BILITOT 0.5 1.1  PROT 6.7 5.7*  ALBUMIN 3.0* 2.6*   Recent Labs  Lab 12/15/19 1804  LIPASE 40   No results for input(s): AMMONIA in the last 168 hours. Coagulation profile Recent Labs  Lab 12/18/19 1617  INR 1.1    CBC: Recent Labs  Lab 12/15/19 1804 12/15/19 1804 12/16/19 0559 12/16/19 1629 12/17/19 0748 12/18/19 0113 12/19/19 0603  WBC 13.1*  --  11.2*  --  11.8* 16.1* 12.6*  NEUTROABS  --   --  8.8*  --   --   --   --   HGB 5.4*   < > 7.3* 7.7* 7.6* 8.3* 7.6*  HCT 20.5*   < > 24.7* 26.9* 26.6* 27.9* 27.2*  MCV 66.3*  --  71.2*  --  72.1* 70.5* 72.7*  PLT 512*  --  494*  --  510* 533* 475*   < > = values in this interval not displayed.   Cardiac Enzymes: No results for input(s): CKTOTAL, CKMB, CKMBINDEX, TROPONINI in the last 168 hours. BNP (last 3 results) No results for input(s): PROBNP in the last 8760 hours. CBG: No results for input(s): GLUCAP in the last 168 hours. D-Dimer: No results for input(s): DDIMER in the last 72 hours. Hgb A1c: No results for input(s): HGBA1C in the last 72 hours. Lipid Profile: No results for input(s): CHOL, HDL, LDLCALC, TRIG, CHOLHDL, LDLDIRECT in the last  72 hours. Thyroid function studies: No results for input(s): TSH, T4TOTAL, T3FREE, THYROIDAB in the last 72 hours.  Invalid input(s): FREET3 Anemia work up: No results for input(s): VITAMINB12,  FOLATE, FERRITIN, TIBC, IRON, RETICCTPCT in the last 72 hours. Sepsis Labs: Recent Labs  Lab 12/16/19 0559 12/17/19 0748 12/18/19 0113 12/19/19 0603  WBC 11.2* 11.8* 16.1* 12.6*    Microbiology Recent Results (from the past 240 hour(s))  Respiratory Panel by RT PCR (Flu A&B, Covid) - Nasopharyngeal Swab     Status: None   Collection Time: 12/15/19 10:07 PM   Specimen: Nasopharyngeal Swab  Result Value Ref Range Status   SARS Coronavirus 2 by RT PCR NEGATIVE NEGATIVE Final    Comment: (NOTE) SARS-CoV-2 target nucleic acids are NOT DETECTED. The SARS-CoV-2 RNA is generally detectable in upper respiratoy specimens during the acute phase of infection. The lowest concentration of SARS-CoV-2 viral copies this assay can detect is 131 copies/mL. A negative result does not preclude SARS-Cov-2 infection and should not be used as the sole basis for treatment or other patient management decisions. A negative result may occur with  improper specimen collection/handling, submission of specimen other than nasopharyngeal swab, presence of viral mutation(s) within the areas targeted by this assay, and inadequate number of viral copies (<131 copies/mL). A negative result must be combined with clinical observations, patient history, and epidemiological information. The expected result is Negative. Fact Sheet for Patients:  PinkCheek.be Fact Sheet for Healthcare Providers:  GravelBags.it This test is not yet ap proved or cleared by the [place] FDA and  has been authorized for detection and/or diagnosis of SARS-CoV-2 by FDA under an Emergency Use Authorization (EUA). This EUA will remain  in effect (meaning this test can be used) for the  duration of the COVID-19 declaration under Section 564(b)(1) of the Act, 21 U.S.C. section 360bbb-3(b)(1), unless the authorization is terminated or revoked sooner.    Influenza A by PCR NEGATIVE NEGATIVE Final   Influenza B by PCR NEGATIVE NEGATIVE Final    Comment: (NOTE) The Xpert Xpress SARS-CoV-2/FLU/RSV assay is intended as an aid in  the diagnosis of influenza from Nasopharyngeal swab specimens and  should not be used as a sole basis for treatment. Nasal washings and  aspirates are unacceptable for Xpert Xpress SARS-CoV-2/FLU/RSV  testing. Fact Sheet for Patients: PinkCheek.be Fact Sheet for Healthcare Providers: GravelBags.it This test is not yet approved or cleared by the [place] FDA and  has been authorized for detection and/or diagnosis of SARS-CoV-2 by  FDA under an Emergency Use Authorization (EUA). This EUA will remain  in effect (meaning this test can be used) for the duration of the  Covid-19 declaration under Section 564(b)(1) of the Act, 21  U.S.C. section 360bbb-3(b)(1), unless the authorization is  terminated or revoked. Performed at [facility], [place] [address], [place], [place] [address]     Procedures and diagnostic studies:  No results found.  Medications:   . ferrous sulfate  325 mg Oral BID WC  . pantoprazole  40 mg Oral Daily   Continuous Infusions: . ciprofloxacin       LOS: 2 days   Radene Gunning NP  [facility]   How to contact the [facility] Attending or Consulting provider [place] or covering provider during after hours [place], for this patient?  1. Check the care team in [facility] and look for a) attending/consulting [facility] provider listed and b) the [facility] team listed 2. Log into [email or web address] and use [place]'s universal password to access. If you do not have the password, please contact the hospital operator. 3. Locate the [facility] provider you are looking for under [facility]  and page to a number that you can be directly reached. 4. If you still have difficulty reaching the provider, please page the [facility] (Director on Call) for the Hospitalists listed on amion for assistance.  12/19/2019, 10:55 AM

## 2019-12-19 NOTE — Progress Notes (Signed)
Physical Therapy Treatment Patient Details Name: Stacy Burns MRN: [id number] DOB: 06/07/1946 Today's Date: 12/19/2019    History of Present Illness Aidah O Brownis a 74 y.o.femalewith medical history significantfor COVID-19 viral infection in January 2021 who now presents with worsening dyspnea associated with generalized weakness.  She went to see her PCP on the day of presentation where her hemoglobin was 5.5. Admitted with severe symptomatic anemia.  Associative symptoms include intermittent RUQ and epigastric pain. GI consulted, plan for EGD and colonoscopy while admitted.  Pt found to have colon mass with liver lesions.    PT Comments    Pt making steady progress with functional mobility as indicated by her ability to ambulate a further distance with less assistance needed (no AD). Planning for port placement and biopsies today. PT will continue to f/u with pt acutely to progress mobility as tolerated and to ensure a safe d/c home.    Follow Up Recommendations  No PT follow up     Equipment Recommendations  None recommended by PT    Recommendations for Other Services       Precautions / Restrictions Precautions Precautions: Fall Restrictions Weight Bearing Restrictions: No    Mobility  Bed Mobility Overal bed mobility: Modified Independent                Transfers Overall transfer level: Modified independent Equipment used: None                Ambulation/Gait Ambulation/Gait assistance: Supervision Gait Distance (Feet): 150 Feet Assistive device: None Gait Pattern/deviations: Step-through pattern Gait velocity: WFL   General Gait Details: no LOB or instability noted; pt able to ambulate in hallway without an AD, navigating around obstacles without difficulties   Stairs             Wheelchair Mobility    Modified Rankin (Stroke Patients Only)       Balance Overall balance assessment: Needs assistance Sitting-balance support: Feet  supported Sitting balance-Leahy Scale: Good     Standing balance support: During functional activity;No upper extremity supported Standing balance-Leahy Scale: Good                              Cognition Arousal/Alertness: Awake/alert Behavior During Therapy: WFL for tasks assessed/performed Overall Cognitive Status: Within Functional Limits for tasks assessed                                        Exercises      General Comments        Pertinent Vitals/Pain Pain Assessment: No/denies pain    Home Living                      Prior Function            PT Goals (current goals can now be found in the care plan section) Acute Rehab PT Goals PT Goal Formulation: With patient Time For Goal Achievement: 12/24/19 Potential to Achieve Goals: Good Progress towards PT goals: Progressing toward goals    Frequency    Min 3X/week      PT Plan Current plan remains appropriate    Co-evaluation              AM-PAC PT "6 Clicks" Mobility   Outcome Measure  Help needed turning from your back to your  side while in a flat bed without using bedrails?: None Help needed moving from lying on your back to sitting on the side of a flat bed without using bedrails?: None Help needed moving to and from a bed to a chair (including a wheelchair)?: None Help needed standing up from a chair using your arms (e.g., wheelchair or bedside chair)?: None Help needed to walk in hospital room?: None Help needed climbing 3-5 steps with a railing? : None 6 Click Score: 24    End of Session   Activity Tolerance: Patient tolerated treatment well Patient left: in bed;with call bell/phone within reach;with family/visitor present(sitting EOB) Nurse Communication: Mobility status PT Visit Diagnosis: Difficulty in walking, not elsewhere classified (R26.2)     Time: 1047-1100 PT Time Calculation (min) (ACUTE ONLY): 13 min  Charges:  $Gait Training: 8-22  mins                     Anastasio Champion, DPT  Acute Rehabilitation Services Pager [phone] Office [place] 12/19/2019, 12:19 PM

## 2019-12-19 NOTE — H&P (Signed)
Chief Complaint: Patient was seen in consultation today for  Chief Complaint  Patient presents with  . Anemia  . Weakness   at the request of Stacy Coder, MD, Medical Oncology  Supervising Physician: Stacy Burns  Patient Status: [facility] Stacy Burns - In-pt  History of Present Illness: Stacy Burns is a 74 y.o. female with a past medical history of COVID pneumonia requiring hospitalization in September 09, 2019. Her husband died in 10/10/19 from [place]. After treatment/discharge the patient continued to have weakness, anemia and significant weight Burns (35 lb). The patient was sent to the [facility] ED on 12/15/19 by her PCP for anemia and further work up revealed a hemoglobin of 5.4 and fecal occult blood was positive.    CT abdomen/pelvix 12/16/19: 1. Focal area of wall thickening and nodularity involving the cecum and ascending colon with probable degree of stricture just above the ileocecal valve most consistent with malignancy. Further evaluation with colonoscopy is recommended. 2. Cirrhosis with innumerable hepatic metastatic disease.   Endoscopy evaluations 12/18/19: Upper endoscopy normal. Colonoscopy positive for ulcerated partially obstructing large mass in the mid ascending colon which was biopsied.  Interventional Radiology has been asked to evaluate this patient for a liver lesion biopsy for further work up and management of this patient's disease process. Her imaging was reviewed by Dr. Pascal Lux on 12/19/19.     Past Medical History:  Diagnosis Date  . COVID-19 virus infection September 09, 2019   not hospitalized.    . Severe anemia 12/17/2019    Past Surgical History:  Procedure Laterality Date  . TUBAL LIGATION      Allergies: Milk-related compounds, Penicillins, Pineapple, Chocolate, Other, and Sulfa antibiotics  Medications: Prior to Admission medications   Medication Sig Start Date End Date Taking? Authorizing Provider  acetaminophen (TYLENOL) 325 MG tablet Take 325 mg by mouth every 6  (six) hours as needed for mild pain or headache.   Yes [provider]  aspirin 81 MG chewable tablet Chew 81 mg by mouth every 3 (three) days.   Yes [provider]  albuterol (VENTOLIN HFA) 108 (90 Base) MCG/ACT inhaler Inhale 1-2 puffs into the lungs See admin instructions. Inhale 1-2 puffs into the lungs every four to six hours as needed for shortness of breath or wheezing 12/12/19   [provider]  SPIRIVA RESPIMAT 2.5 MCG/ACT AERS Inhale 2 puffs into the lungs daily.  12/12/19   [provider]     Family History  Problem Relation Age of Onset  . Multiple myeloma Mother   . Diabetes Mellitus II Mother   . Leukemia Father   . Muscular dystrophy Brother     Social History   Socioeconomic History  . Marital status: Widowed    Spouse name: Not on file  . Number of children: 2  . Years of education: Not on file  . Highest education level: Not on file  Occupational History  . Not on file  Tobacco Use  . Smoking status: Former Smoker    Years: 25.00    Types: Cigarettes    Quit date: 08/14/1984    Years since quitting: 35.3  . Smokeless tobacco: Never Used  Substance and Sexual Activity  . Alcohol use: Not Currently  . Drug use: Never  . Sexual activity: Not on file  Other Topics Concern  . Not on file  Social History Narrative  . Not on file   Social Determinants of Health   Financial Resource Strain:   . Difficulty of Paying  Living Expenses:   Food Insecurity:   . Worried About Charity fundraiser in the Last Year:   . Arboriculturist in the Last Year:   Transportation Needs:   . Film/video editor (Medical):   Marland Kitchen Lack of Transportation (Non-Medical):   Physical Activity:   . Days of Exercise per Week:   . Minutes of Exercise per Session:   Stress:   . Feeling of Stress :   Social Connections:   . Frequency of Communication with Friends and Family:   . Frequency of Social Gatherings with Friends and Family:   . Attends  Religious Services:   . Active Member of Clubs or Organizations:   . Attends Archivist Meetings:   Marland Kitchen Marital Status:     Review of Systems: A 12 point ROS discussed and pertinent positives are indicated in the HPI above.  All other systems are negative.  Review of Systems  Constitutional: Positive for activity change, appetite change, fatigue and unexpected weight change.  Respiratory: Negative for cough and shortness of breath.   Cardiovascular: Negative for chest pain.  Gastrointestinal: Positive for diarrhea and nausea. Negative for abdominal distention and abdominal pain.       Intermittent nausea and diarrhea  Musculoskeletal: Negative for back pain.  Neurological: Negative for dizziness and headaches.    Vital Signs: BP (!) 147/69 (BP Location: Right Arm)   Pulse 85   Temp 98.3 F (36.8 C) (Oral)   Resp 15   Ht '5\' 7"'$  (1.702 m)   Wt 175 lb (79.4 kg)   SpO2 96%   BMI 27.41 kg/m   Physical Exam Constitutional:      Appearance: Normal appearance.  Cardiovascular:     Rate and Rhythm: Normal rate and regular rhythm.     Pulses: Normal pulses.     Heart sounds: Normal heart sounds.  Pulmonary:     Effort: Pulmonary effort is normal.     Breath sounds: Normal breath sounds.  Abdominal:     General: Bowel sounds are normal. There is no distension.     Palpations: Abdomen is soft.     Tenderness: There is no abdominal tenderness.  Skin:    General: Skin is warm and dry.  Neurological:     Mental Status: She is alert and oriented to person, place, and time.  Psychiatric:        Mood and Affect: Mood normal.        Behavior: Behavior normal.        Thought Content: Thought content normal.        Judgment: Judgment normal.     Imaging: CT ABDOMEN PELVIS WO CONTRAST  Result Date: 12/16/2019 CLINICAL DATA:  74 year old female with anemia. Weight Burns. Evaluate for malignancy. EXAM: CT ABDOMEN AND PELVIS WITHOUT CONTRAST TECHNIQUE: Multidetector CT imaging  of the abdomen and pelvis was performed following the standard protocol without IV contrast. COMPARISON:  None. FINDINGS: Evaluation of this exam is limited in the absence of intravenous contrast. Lower chest: There is a 5 mm left lower lobe calcified granuloma. The visualized lung bases are otherwise clear. No intra-abdominal free air or free fluid. Hepatobiliary: Cirrhosis. Multiple hepatic hypodense lesions measuring up to approximately 3.5 cm consistent with metastatic disease. No intrahepatic biliary ductal dilatation. The gallbladder is unremarkable. Pancreas: Unremarkable. No pancreatic ductal dilatation or surrounding inflammatory changes. Spleen: Normal in size without focal abnormality. Adrenals/Urinary Tract: The adrenal glands are unremarkable. The kidneys, and visualized ureters appear  unremarkable. The urinary bladder is collapsed. Stomach/Bowel: There is an area of thickening of the proximal colon involving the cecum and ascending colon with probable degree of stricture just above the ileocecal valve most consistent with malignancy. There is stranding and haziness of the adjacent fat with several nodular density adjacent to the cecum consistent with metastatic disease. The largest pericecal nodule or confluent of nodule medial to the cecum measures 20 x 17 mm (49/3). There is a small hiatal hernia. There is no bowel obstruction. The appendix is normal. Vascular/Lymphatic: Advanced aortoiliac atherosclerotic disease. The IVC is unremarkable. No portal venous gas. There is no adenopathy. Reproductive: The uterus and ovaries are grossly unremarkable. Other: None Musculoskeletal: None mild degenerative changes. No acute osseous pathology. IMPRESSION: 1. Focal area of wall thickening and nodularity involving the cecum and ascending colon with probable degree of stricture just above the ileocecal valve most consistent with malignancy. Further evaluation with colonoscopy is recommended. 2. Cirrhosis with  innumerable hepatic metastatic disease. 3. No bowel obstruction. Normal appendix. 4. Aortic Atherosclerosis (ICD10-I70.0). Electronically Signed   By: Anner Crete M.D.   On: 12/16/2019 22:23   DG Chest 2 View  Result Date: 12/12/2019 CLINICAL DATA:  History of COVID-19 positivity in January of 2021 with increasing shortness of breath EXAM: CHEST - 2 VIEW COMPARISON:  12/25/2013 FINDINGS: Cardiac shadow is within normal limits. Calcified granuloma is noted in left lung base. No sizable infiltrate or effusion is seen. No bony abnormality is noted. IMPRESSION: No active cardiopulmonary disease. Electronically Signed   By: Inez Catalina M.D.   On: 12/12/2019 20:36   ECHOCARDIOGRAM COMPLETE  Result Date: 12/16/2019    ECHOCARDIOGRAM REPORT   Patient Name:   LEOLA FIORE Date of Exam: 12/16/2019 Medical Rec #:  [id number]    Height:       67.0 in Accession #:    [id number]   Weight:       182.0 lb Date of Birth:  16-Jul-1946     BSA:          1.943 m Patient Age:    64 years     BP:           136/68 mmHg Patient Gender: F            HR:           79 bpm. Exam Location:  Inpatient Procedure: 2D Echo, Color Doppler and Cardiac Doppler Indications:    R06.9 DOE  History:        Patient has no prior history of Echocardiogram examinations.                 COVID+ in Jan 2021.  Sonographer:    Raquel Sarna Senior RDCS Referring Phys: [id number] [place]  Sonographer Comments: Suboptimal parasternal window. IMPRESSIONS  1. Left ventricular ejection fraction, by estimation, is 60 to 65%. The left ventricle has normal function. The left ventricle has no regional wall motion abnormalities. There is mild concentric left ventricular hypertrophy. Left ventricular diastolic parameters are indeterminate.  2. Right ventricular systolic function is normal. The right ventricular size is normal.  3. The mitral valve is normal in structure. Trivial mitral valve regurgitation. No evidence of mitral stenosis.  4. The aortic valve was not  well visualized. Aortic valve regurgitation is not visualized. No aortic stenosis is present.  5. The inferior vena cava is dilated in size with >50% respiratory variability, suggesting right atrial pressure of 8 mmHg. Comparison(s): No prior Echocardiogram.  Conclusion(s)/Recommendation(s): Normal biventricular function without evidence of hemodynamically significant valvular heart disease. FINDINGS  Left Ventricle: Left ventricular ejection fraction, by estimation, is 60 to 65%. The left ventricle has normal function. The left ventricle has no regional wall motion abnormalities. The left ventricular internal cavity size was normal in size. There is  mild concentric left ventricular hypertrophy. Left ventricular diastolic parameters are indeterminate. Right Ventricle: The right ventricular size is normal. No increase in right ventricular wall thickness. Right ventricular systolic function is normal. Left Atrium: Left atrial size was normal in size. Right Atrium: Right atrial size was normal in size. Pericardium: There is no evidence of pericardial effusion. Mitral Valve: The mitral valve is normal in structure. Moderate mitral annular calcification. Trivial mitral valve regurgitation. No evidence of mitral valve stenosis. Tricuspid Valve: The tricuspid valve is normal in structure. Tricuspid valve regurgitation is trivial. No evidence of tricuspid stenosis. Aortic Valve: The aortic valve was not well visualized. Aortic valve regurgitation is not visualized. No aortic stenosis is present. Pulmonic Valve: The pulmonic valve was not well visualized. Pulmonic valve regurgitation is not visualized. No evidence of pulmonic stenosis. Aorta: The aortic root was not well visualized and the ascending aorta was not well visualized. Pulmonary Artery: The pulmonary artery is not well seen. Venous: The inferior vena cava is dilated in size with greater than 50% respiratory variability, suggesting right atrial pressure of 8 mmHg.  IAS/Shunts: The atrial septum is grossly normal.  LEFT VENTRICLE PLAX 2D LVIDd:         4.37 cm  Diastology LVIDs:         2.83 cm  LV e' lateral:   9.03 cm/s LV PW:         1.32 cm  LV E/e' lateral: 10.5 LV IVS:        1.25 cm  LV e' medial:    6.96 cm/s LVOT diam:     2.10 cm  LV E/e' medial:  13.6 LV SV:         74 LV SV Index:   38 LVOT Area:     3.46 cm  RIGHT VENTRICLE RV S prime:     12.20 cm/s TAPSE (M-mode): 2.0 cm LEFT ATRIUM             Index       RIGHT ATRIUM           Index LA diam:        3.70 cm 1.90 cm/m  RA Area:     11.70 cm LA Vol (A2C):   61.9 ml 31.86 ml/m RA Volume:   24.00 ml  12.35 ml/m LA Vol (A4C):   51.2 ml 26.35 ml/m LA Biplane Vol: 55.9 ml 28.77 ml/m  AORTIC VALVE LVOT Vmax:   112.00 cm/s LVOT Vmean:  74.300 cm/s LVOT VTI:    0.214 m  AORTA Ao Root diam: 3.60 cm Ao Asc diam:  3.30 cm MITRAL VALVE MV Area (PHT): 2.76 cm    SHUNTS MV Decel Time: 275 msec    Systemic VTI:  0.21 m MV E velocity: 94.60 cm/s  Systemic Diam: 2.10 cm MV A velocity: 97.50 cm/s MV E/A ratio:  0.97 Buford Dresser MD Electronically signed by Buford Dresser MD Signature Date/Time: 12/16/2019/2:19:48 PM    Final     Labs:  CBC: Recent Labs    12/16/19 0559 12/16/19 0559 12/16/19 1629 12/17/19 0748 12/18/19 0113 12/19/19 0603  WBC 11.2*  --   --  11.8* 16.1* 12.6*  HGB 7.3*   < > 7.7* 7.6* 8.3* 7.6*  HCT 24.7*   < > 26.9* 26.6* 27.9* 27.2*  PLT 494*  --   --  510* 533* 475*   < > = values in this interval not displayed.    COAGS: Recent Labs    12/18/19 1617  INR 1.1    BMP: Recent Labs    12/16/19 0559 12/17/19 0748 12/18/19 0113 12/19/19 0603  NA 137 136 134* 137  K 3.3* 4.2 4.5 4.5  CL 105 107 102 106  CO2 19* 21* 21* 21*  GLUCOSE 93 115* 160* 97  BUN '20 12 10 18  '$ CALCIUM 8.3* 8.5* 8.8* 8.7*  CREATININE 0.97 1.01* 0.91 1.21*  GFRNONAA 58* 55* >60 44*  GFRAA >60 >60 >60 51*    LIVER FUNCTION TESTS: Recent Labs    12/15/19 1804 12/16/19 0559    BILITOT 0.5 1.1  AST 44* 41  ALT 31 28  ALKPHOS 192* 168*  PROT 6.7 5.7*  ALBUMIN 3.0* 2.6*    TUMOR MARKERS: No results for input(s): AFPTM, CEA, CA199, CHROMGRNA in the last 8760 hours.  Assessment and Plan: Iria Jamerson is a 74 year old female with a history of COVID pneumonia in January 2021 and was admitted 12/15/19 for weakness, anemia and weight Burns. She was found to have a hemoglobin of 5.4, an ulcerated partially obstructing large mass in the mid ascending colon and numerous liver lesions.  The patient is scheduled for an image-guided liver lesion biopsy today in Interventional Radiology. Risks and benefits of a liver lesion biopsy was discussed with the patient and/or patient's family including, but not limited to bleeding, infection, damage to adjacent structures or low yield requiring additional tests.  All of the questions were answered and there is agreement to proceed.  Consent signed and in chart. Patient has been NPO, she is not on any blood thinners. Labs and vitals have been reviewed.   The patient is scheduled for the OR at 1200 today with General Surgery for a central venous catheter with port placement.     Thank you for this interesting consult.  I greatly enjoyed meeting JANEAH KOVACICH and look forward to participating in their care.  A copy of this report was sent to the requesting provider on this date.  Electronically Signed: Theresa Duty, NP 12/19/2019, 8:45 AM   I spent a total of 20 Minutes in face to face in clinical consultation, greater than 50% of which was counseling/coordinating care for liver lesion biopsy.

## 2019-12-19 NOTE — Transfer of Care (Signed)
Immediate Anesthesia Transfer of Care Note  Patient: Stacy Burns  Procedure(s) Performed: INSERTION PORT-A-CATH WITH ULTRASOUND (N/A Chest)  Patient Location: PACU  Anesthesia Type:General  Level of Consciousness: drowsy and patient cooperative  Airway & Oxygen Therapy: Patient Spontanous Breathing and Patient connected to face mask oxygen  Post-op Assessment: Report given to RN and Post -op Vital signs reviewed and stable  Post vital signs: Reviewed and stable  Last Vitals:  Vitals Value Taken Time  BP 124/63 12/19/19 1335  Temp    Pulse 81 12/19/19 1336  Resp 14 12/19/19 1336  SpO2 100 % 12/19/19 1336  Vitals shown include unvalidated device data.  Last Pain:  Vitals:   12/19/19 1125  TempSrc: Oral  PainSc:          Complications: No apparent anesthesia complications

## 2019-12-19 NOTE — Anesthesia Procedure Notes (Addendum)
Procedure Name: LMA Insertion Date/Time: 12/19/2019 12:49 PM Performed by: Kathryne Hitch, CRNA Pre-anesthesia Checklist: Patient identified, Emergency Drugs available, Suction available and Patient being monitored Patient Re-evaluated:Patient Re-evaluated prior to induction Oxygen Delivery Method: Simple face mask Preoxygenation: Pre-oxygenation with 100% oxygen Induction Type: IV induction LMA: LMA inserted LMA Size: 4.0 Number of attempts: 1 Placement Confirmation: positive ETCO2 and breath sounds checked- equal and bilateral Dental Injury: Teeth and Oropharynx as per pre-operative assessment

## 2019-12-19 NOTE — Progress Notes (Signed)
Patient ID: Stacy Burns, female   DOB: [birth year], 74 y.o.   MRN: [id number] cxr good after port, will sign off

## 2019-12-19 NOTE — Progress Notes (Addendum)
1 Day Post-Op   Subjective/Chief Complaint: No complaints   Objective: Vital signs in last 24 hours: Temp:  [97 F (36.1 C)-98.9 F (37.2 C)] 98.3 F (36.8 C) (05/07 0545) Pulse Rate:  [67-90] 85 (05/07 0545) Resp:  [15-22] 15 (05/07 0545) BP: (100-151)/(32-75) 147/69 (05/07 0545) SpO2:  [96 %-100 %] 96 % (05/07 0545) Weight:  [79.4 kg] 79.4 kg (05/06 1123) Last BM Date: 12/18/19  Intake/Output from previous day: 05/06 0701 - 05/07 0700 In: 1030 [P.O.:480; I.V.:550] Out: -  Intake/Output this shift: No intake/output data recorded.   Lab Results:  Recent Labs    12/18/19 0113 12/19/19 0603  WBC 16.1* 12.6*  HGB 8.3* 7.6*  HCT 27.9* 27.2*  PLT 533* 475*   BMET Recent Labs    12/18/19 0113 12/19/19 0603  NA 134* 137  K 4.5 4.5  CL 102 106  CO2 21* 21*  GLUCOSE 160* 97  BUN 10 18  CREATININE 0.91 1.21*  CALCIUM 8.8* 8.7*   PT/INR Recent Labs    12/18/19 1617  LABPROT 13.3  INR 1.1   ABG No results for input(s): PHART, HCO3 in the last 72 hours.  Invalid input(s): PCO2, PO2  Studies/Results: No results found.  Anti-infectives: Anti-infectives (From admission, onward)   Start     Dose/Rate Route Frequency Ordered Stop   12/19/19 0600  ciprofloxacin (CIPRO) IVPB 400 mg     400 mg 200 mL/hr over 60 Minutes Intravenous On call to O.R. 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

## 2019-12-20 ENCOUNTER — Encounter: Payer: Self-pay | Admitting: *Deleted

## 2019-12-20 LAB — CBC
HCT: 26.7 % — ABNORMAL LOW (ref 36.0–46.0)
Hemoglobin: 7.6 g/dL — ABNORMAL LOW (ref 12.0–15.0)
MCH: 20.7 pg — ABNORMAL LOW (ref 26.0–34.0)
MCHC: 28.5 g/dL — ABNORMAL LOW (ref 30.0–36.0)
MCV: 72.6 fL — ABNORMAL LOW (ref 80.0–100.0)
Platelets: 464 10*3/uL — ABNORMAL HIGH (ref 150–400)
RBC: 3.68 MIL/uL — ABNORMAL LOW (ref 3.87–5.11)
RDW: 26.1 % — ABNORMAL HIGH (ref 11.5–15.5)
WBC: 16.8 10*3/uL — ABNORMAL HIGH (ref 4.0–10.5)
nRBC: 0 % (ref 0.0–0.2)

## 2019-12-20 LAB — CEA: CEA: 8923 ng/mL — ABNORMAL HIGH (ref 0.0–4.7)

## 2019-12-20 MED ORDER — FERROUS SULFATE 325 (65 FE) MG PO TABS: 325.0000 mg | ORAL_TABLET | Freq: Two times a day (BID) | ORAL | 0 refills | Status: DC

## 2019-12-20 NOTE — Progress Notes (Signed)
Pt discharge education completed at bedside with pt and pt daughter  Pt has all belongings  Pt IVs removed by NT, catheters intact Pt discharged via wheelchair with NT

## 2019-12-20 NOTE — Discharge Summary (Signed)
Physician Discharge Summary  YAMANI CHEA [id number] DOB: 20-Nov-1945 DOA: 12/15/2019  PCP: Ferd Hibbs, NP  Admit date: 12/15/2019 Discharge date: 12/20/2019  Admitted From: Home Discharge disposition: *Home   Recommendations for Outpatient Follow-Up:   1. Referral placed for liver biopsy by interventional radiology 2. CBC 1 week 3. Follow-up with Dr. Benay Spice of oncology for treatment plan 4. S/p Port placement 5. Patient instructed to come back to the ER if she has worsening abdominal pain or nausea vomiting as this may be a sign of an obstruction   Discharge Diagnosis:   Principal Problem:   Symptomatic anemia Active Problems:   Hypokalemia   Leukocytosis   Bilateral lower extremity edema   Abnormal EKG   Elevated blood pressure reading   Hepatic cirrhosis (HCC)   Metastatic disease (HCC)   GI bleed   Anemia   Colonic mass    Discharge Condition: Improved.  Diet recommendation: Low sodium, heart healthy.   Wound care: None.  Code status: Full.   History of Present Illness:   Stacy Burns is a 74 y.o. female with medical history significant for hospitalization in January this year due to COVID-19 infection who went to see her PCP today due to persistent weakness and dyspnea since January following her coronavirus illness and is being referred from her PCP office to the ED due to having a hemoglobin of 5.  She has been having intermittent RUQ and epigastric pain, but denies nausea, hematemesis, diarrhea, constipation, melena or hematochezia.  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  She denied pica or somnolence.  She has actually been having insomnia after losing her husband to COVID-19 in February this year.  She denied fever, chills, sore throat, rhinorrhea, wheezing or hemoptysis.  She denied chest pain, palpitations, diaphoresis, PND, orthopnea or recent pitting edema of the lower extremities.  However, states that she frequently had  bilateral lower extremity edema before losing weight post COVID-19 infection.  Denied dysuria, frequency or hematuria.  No polyuria, polydipsia, polyphagia or blurred vision.    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   -Hemoglobin 7.6 status post 2 units of packed red blood cells.  She was FOBT positive.  Underwent upper endoscopy which was normal.  Underwent colonoscopy yesterday revealing an ulcerative partially obstructing large colon mass which was biopsied. path shows: invasive adenocarcinoma -iron -transfuse for Hg 7 or less -Will need liver biopsy by interventional radiology, outpatient order placed Status post port placement -CEA markedly elevated -Follow-up with oncology   Hypertension.   -Monitor  Leukocytosis.   - She has been afebrile hemodynamically stable and nontoxic-appearing.  No signs of infection.  Hypokalemia.   -Repleted and resolved     Medical Consultants:   [facility] General surgery   Discharge Exam:   Vitals:   12/20/19 0526 12/20/19 0841  BP: (!) 151/72 (!) 155/67  Pulse: 80 76  Resp: 18   Temp: 98.1 F (36.7 C)   SpO2: 95%    Vitals:   12/19/19 1745 12/19/19 2348 12/20/19 0526 12/20/19 0841  BP: (!) 141/67 132/61 (!) 151/72 (!) 155/67  Pulse: 91 82 80 76  Resp: 18 18 18    Temp: 98.3 F (36.8 C) 98 F (36.7 C) 98.1 F (36.7 C)   TempSrc: Oral Oral Oral   SpO2: 96% 97% 95%   Weight:      Height:  General exam: Appears calm and comfortable.  Up walking around the unit   The results of significant diagnostics from this hospitalization (including imaging, microbiology, ancillary and laboratory) are listed below for reference.     Procedures and Diagnostic Studies:   CT ABDOMEN PELVIS WO CONTRAST  Result Date: 12/16/2019 CLINICAL DATA:  74 year old female with anemia. Weight loss. Evaluate for malignancy. EXAM: CT ABDOMEN AND PELVIS WITHOUT  CONTRAST TECHNIQUE: Multidetector CT imaging of the abdomen and pelvis was performed following the standard protocol without IV contrast. COMPARISON:  None. FINDINGS: Evaluation of this exam is limited in the absence of intravenous contrast. Lower chest: There is a 5 mm left lower lobe calcified granuloma. The visualized lung bases are otherwise clear. No intra-abdominal free air or free fluid. Hepatobiliary: Cirrhosis. Multiple hepatic hypodense lesions measuring up to approximately 3.5 cm consistent with metastatic disease. No intrahepatic biliary ductal dilatation. The gallbladder is unremarkable. Pancreas: Unremarkable. No pancreatic ductal dilatation or surrounding inflammatory changes. Spleen: Normal in size without focal abnormality. Adrenals/Urinary Tract: The adrenal glands are unremarkable. The kidneys, and visualized ureters appear unremarkable. The urinary bladder is collapsed. Stomach/Bowel: There is an area of thickening of the proximal colon involving the cecum and ascending colon with probable degree of stricture just above the ileocecal valve most consistent with malignancy. There is stranding and haziness of the adjacent fat with several nodular density adjacent to the cecum consistent with metastatic disease. The largest pericecal nodule or confluent of nodule medial to the cecum measures 20 x 17 mm (49/3). There is a small hiatal hernia. There is no bowel obstruction. The appendix is normal. Vascular/Lymphatic: Advanced aortoiliac atherosclerotic disease. The IVC is unremarkable. No portal venous gas. There is no adenopathy. Reproductive: The uterus and ovaries are grossly unremarkable. Other: None Musculoskeletal: None mild degenerative changes. No acute osseous pathology. IMPRESSION: 1. Focal area of wall thickening and nodularity involving the cecum and ascending colon with probable degree of stricture just above the ileocecal valve most consistent with malignancy. Further evaluation with  colonoscopy is recommended. 2. Cirrhosis with innumerable hepatic metastatic disease. 3. No bowel obstruction. Normal appendix. 4. Aortic Atherosclerosis (ICD10-I70.0). Electronically Signed   By: Anner Crete M.D.   On: 12/16/2019 22:23   ECHOCARDIOGRAM COMPLETE  Result Date: 12/16/2019    ECHOCARDIOGRAM REPORT   Patient Name:   HARRIS MCQUARY Date of Exam: 12/16/2019 Medical Rec #:  [id number]    Height:       67.0 in Accession #:    [id number]   Weight:       182.0 lb Date of Birth:  29-Oct-1945     BSA:          1.943 m Patient Age:    42 years     BP:           136/68 mmHg Patient Gender: F            HR:           79 bpm. Exam Location:  Inpatient Procedure: 2D Echo, Color Doppler and Cardiac Doppler Indications:    R06.9 DOE  History:        Patient has no prior history of Echocardiogram examinations.                 COVID+ in Jan 2021.  Sonographer:    Raquel Sarna Senior RDCS Referring Phys: [id number] [place]  Sonographer Comments: Suboptimal parasternal window. IMPRESSIONS  1. Left ventricular ejection fraction, by estimation, is 60 to  65%. The left ventricle has normal function. The left ventricle has no regional wall motion abnormalities. There is mild concentric left ventricular hypertrophy. Left ventricular diastolic parameters are indeterminate.  2. Right ventricular systolic function is normal. The right ventricular size is normal.  3. The mitral valve is normal in structure. Trivial mitral valve regurgitation. No evidence of mitral stenosis.  4. The aortic valve was not well visualized. Aortic valve regurgitation is not visualized. No aortic stenosis is present.  5. The inferior vena cava is dilated in size with >50% respiratory variability, suggesting right atrial pressure of 8 mmHg. Comparison(s): No prior Echocardiogram. Conclusion(s)/Recommendation(s): Normal biventricular function without evidence of hemodynamically significant valvular heart disease. FINDINGS  Left Ventricle: Left  ventricular ejection fraction, by estimation, is 60 to 65%. The left ventricle has normal function. The left ventricle has no regional wall motion abnormalities. The left ventricular internal cavity size was normal in size. There is  mild concentric left ventricular hypertrophy. Left ventricular diastolic parameters are indeterminate. Right Ventricle: The right ventricular size is normal. No increase in right ventricular wall thickness. Right ventricular systolic function is normal. Left Atrium: Left atrial size was normal in size. Right Atrium: Right atrial size was normal in size. Pericardium: There is no evidence of pericardial effusion. Mitral Valve: The mitral valve is normal in structure. Moderate mitral annular calcification. Trivial mitral valve regurgitation. No evidence of mitral valve stenosis. Tricuspid Valve: The tricuspid valve is normal in structure. Tricuspid valve regurgitation is trivial. No evidence of tricuspid stenosis. Aortic Valve: The aortic valve was not well visualized. Aortic valve regurgitation is not visualized. No aortic stenosis is present. Pulmonic Valve: The pulmonic valve was not well visualized. Pulmonic valve regurgitation is not visualized. No evidence of pulmonic stenosis. Aorta: The aortic root was not well visualized and the ascending aorta was not well visualized. Pulmonary Artery: The pulmonary artery is not well seen. Venous: The inferior vena cava is dilated in size with greater than 50% respiratory variability, suggesting right atrial pressure of 8 mmHg. IAS/Shunts: The atrial septum is grossly normal.  LEFT VENTRICLE PLAX 2D LVIDd:         4.37 cm  Diastology LVIDs:         2.83 cm  LV e' lateral:   9.03 cm/s LV PW:         1.32 cm  LV E/e' lateral: 10.5 LV IVS:        1.25 cm  LV e' medial:    6.96 cm/s LVOT diam:     2.10 cm  LV E/e' medial:  13.6 LV SV:         74 LV SV Index:   38 LVOT Area:     3.46 cm  RIGHT VENTRICLE RV S prime:     12.20 cm/s TAPSE (M-mode): 2.0  cm LEFT ATRIUM             Index       RIGHT ATRIUM           Index LA diam:        3.70 cm 1.90 cm/m  RA Area:     11.70 cm LA Vol (A2C):   61.9 ml 31.86 ml/m RA Volume:   24.00 ml  12.35 ml/m LA Vol (A4C):   51.2 ml 26.35 ml/m LA Biplane Vol: 55.9 ml 28.77 ml/m  AORTIC VALVE LVOT Vmax:   112.00 cm/s LVOT Vmean:  74.300 cm/s LVOT VTI:    0.214 m  AORTA Ao Root  diam: 3.60 cm Ao Asc diam:  3.30 cm MITRAL VALVE MV Area (PHT): 2.76 cm    SHUNTS MV Decel Time: 275 msec    Systemic VTI:  0.21 m MV E velocity: 94.60 cm/s  Systemic Diam: 2.10 cm MV A velocity: 97.50 cm/s MV E/A ratio:  0.97 Buford Dresser MD Electronically signed by Buford Dresser MD Signature Date/Time: 12/16/2019/2:19:48 PM    Final      Labs:   Basic Metabolic Panel: Recent Labs  Lab 12/15/19 1804 12/15/19 1804 12/16/19 0559 12/16/19 0559 12/17/19 [id number] 12/17/19 0748 12/18/19 0113 12/19/19 0603  NA 134*  --  137  --  136  --  134* 137  K 3.3*   < > 3.3*   < > 4.2   < > 4.5 4.5  CL 103  --  105  --  107  --  102 106  CO2 18*  --  19*  --  21*  --  21* 21*  GLUCOSE 102*  --  93  --  115*  --  160* 97  BUN 26*  --  20  --  12  --  10 18  CREATININE 1.22*  --  0.97  --  1.01*  --  0.91 1.21*  CALCIUM 8.7*  --  8.3*  --  8.5*  --  8.8* 8.7*   < > = values in this interval not displayed.   GFR Estimated Creatinine Clearance: 44.2 mL/min (A) (by C-G formula based on SCr of 1.21 mg/dL (H)). Liver Function Tests: Recent Labs  Lab 12/15/19 1804 12/16/19 0559  AST 44* 41  ALT 31 28  ALKPHOS 192* 168*  BILITOT 0.5 1.1  PROT 6.7 5.7*  ALBUMIN 3.0* 2.6*   Recent Labs  Lab 12/15/19 1804  LIPASE 40   No results for input(s): AMMONIA in the last 168 hours. Coagulation profile Recent Labs  Lab 12/18/19 1617  INR 1.1    CBC: Recent Labs  Lab 12/16/19 0559 12/16/19 0559 12/16/19 1629 12/17/19 0748 12/18/19 0113 12/19/19 0603 12/20/19 0914  WBC 11.2*  --   --  11.8* 16.1* 12.6* 16.8*    NEUTROABS 8.8*  --   --   --   --   --   --   HGB 7.3*   < > 7.7* 7.6* 8.3* 7.6* 7.6*  HCT 24.7*   < > 26.9* 26.6* 27.9* 27.2* 26.7*  MCV 71.2*  --   --  72.1* 70.5* 72.7* 72.6*  PLT 494*  --   --  510* 533* 475* 464*   < > = values in this interval not displayed.   Cardiac Enzymes: No results for input(s): CKTOTAL, CKMB, CKMBINDEX, TROPONINI in the last 168 hours. BNP: Invalid input(s): POCBNP CBG: No results for input(s): GLUCAP in the last 168 hours. D-Dimer No results for input(s): DDIMER in the last 72 hours. Hgb A1c No results for input(s): HGBA1C in the last 72 hours. Lipid Profile No results for input(s): CHOL, HDL, LDLCALC, TRIG, CHOLHDL, LDLDIRECT in the last 72 hours. Thyroid function studies No results for input(s): TSH, T4TOTAL, T3FREE, THYROIDAB in the last 72 hours.  Invalid input(s): FREET3 Anemia work up No results for input(s): VITAMINB12, FOLATE, FERRITIN, TIBC, IRON, RETICCTPCT in the last 72 hours. Microbiology Recent Results (from the past 240 hour(s))  Respiratory Panel by RT PCR (Flu A&B, Covid) - Nasopharyngeal Swab     Status: None   Collection Time: 12/15/19 10:07 PM   Specimen: Nasopharyngeal Swab  Result  Value Ref Range Status   SARS Coronavirus 2 by RT PCR NEGATIVE NEGATIVE Final    Comment: (NOTE) SARS-CoV-2 target nucleic acids are NOT DETECTED. The SARS-CoV-2 RNA is generally detectable in upper respiratoy specimens during the acute phase of infection. The lowest concentration of SARS-CoV-2 viral copies this assay can detect is 131 copies/mL. A negative result does not preclude SARS-Cov-2 infection and should not be used as the sole basis for treatment or other patient management decisions. A negative result may occur with  improper specimen collection/handling, submission of specimen other than nasopharyngeal swab, presence of viral mutation(s) within the areas targeted by this assay, and inadequate number of viral copies (<131  copies/mL). A negative result must be combined with clinical observations, patient history, and epidemiological information. The expected result is Negative. Fact Sheet for Patients:  PinkCheek.be Fact Sheet for Healthcare Providers:  GravelBags.it This test is not yet ap proved or cleared by the [place] FDA and  has been authorized for detection and/or diagnosis of SARS-CoV-2 by FDA under an Emergency Use Authorization (EUA). This EUA will remain  in effect (meaning this test can be used) for the duration of the COVID-19 declaration under Section 564(b)(1) of the Act, 21 U.S.C. section 360bbb-3(b)(1), unless the authorization is terminated or revoked sooner.    Influenza A by PCR NEGATIVE NEGATIVE Final   Influenza B by PCR NEGATIVE NEGATIVE Final    Comment: (NOTE) The Xpert Xpress SARS-CoV-2/FLU/RSV assay is intended as an aid in  the diagnosis of influenza from Nasopharyngeal swab specimens and  should not be used as a sole basis for treatment. Nasal washings and  aspirates are unacceptable for Xpert Xpress SARS-CoV-2/FLU/RSV  testing. Fact Sheet for Patients: PinkCheek.be Fact Sheet for Healthcare Providers: GravelBags.it This test is not yet approved or cleared by the [place] FDA and  has been authorized for detection and/or diagnosis of SARS-CoV-2 by  FDA under an Emergency Use Authorization (EUA). This EUA will remain  in effect (meaning this test can be used) for the duration of the  Covid-19 declaration under Section 564(b)(1) of the Act, 21  U.S.C. section 360bbb-3(b)(1), unless the authorization is  terminated or revoked. Performed at [facility], [place] [address] [place], [place] [address]      Discharge Instructions:   Discharge Instructions    Diet general   Complete by: As directed    Discharge instructions   Complete by: As  directed    Iron can be constipating so you may need to use a bowel regimen to avoid this   Increase activity slowly   Complete by: As directed      Allergies as of 12/20/2019      Reactions   Milk-related Compounds Nausea Only, Other (See Comments)   Delayed reaction if too much consumed    Penicillins Anaphylaxis, Other (See Comments)   Both parents had anaphylactic reactions to this   Pineapple Swelling, Other (See Comments)   Tongue swells and causes acid bumps there, also   Chocolate Other (See Comments)   Nasal congestion   Other Itching, Rash, Other (See Comments)   "Most all antibiotics and OTC cold meds break me out" Allergic to ALL NUTS, also = Itching   Sulfa Antibiotics Rash, Other (See Comments)   Severe rash      Medication List    STOP taking these medications   aspirin 81 MG chewable tablet     TAKE these medications   acetaminophen 325 MG tablet  Commonly known as: TYLENOL Take 325 mg by mouth every 6 (six) hours as needed for mild pain or headache.   albuterol 108 (90 Base) MCG/ACT inhaler Commonly known as: VENTOLIN HFA Inhale 1-2 puffs into the lungs See admin instructions. Inhale 1-2 puffs into the lungs every four to six hours as needed for shortness of breath or wheezing   ferrous sulfate 325 (65 FE) MG tablet Take 1 tablet (325 mg total) by mouth 2 (two) times daily with a meal.   Spiriva Respimat 2.5 MCG/ACT Aers Generic drug: Tiotropium Bromide Monohydrate Inhale 2 puffs into the lungs daily.      Follow-up Information    Ladell Pier, MD Follow up.   Specialty: Oncology Why: 12/26/2019 Contact information: [place] [address] [phone]        Ferd Hibbs, NP Follow up in 1 week(s).   Specialty: Nurse Practitioner Contact information: [place] [address] [phone]            Time coordinating discharge: 35 minutes  Signed:  Geradine Girt DO  [facility] 12/20/2019, 3:30 PM

## 2019-12-20 NOTE — Discharge Instructions (Signed)
Return to ER with N/V or worsening abdominal pain as the mass could be causing an obstruction

## 2019-12-20 NOTE — Progress Notes (Signed)
Occupational Therapy Treatment Patient Details Name: Stacy Burns MRN: [id number] DOB: 10-04-1945 Today's Date: 12/20/2019    History of present illness Stacy O Brownis a 74 y.o.femalewith medical history significantfor COVID-19 viral infection in January 2021 who now presents with worsening dyspnea associated with generalized weakness.  She went to see her PCP on the day of presentation where her hemoglobin was 5.5. Admitted with severe symptomatic anemia.  Associative symptoms include intermittent RUQ and epigastric pain. GI consulted, plan for EGD and colonoscopy while admitted.  Pt found to have colon mass with liver lesions.   OT comments  Pt presents supine in bed pleasant and willing to participate in therapy session. Issued pt level 1 theraband and instructed in UE HEP for continued strengthening and endurance after return home. Focus on HEP with LUE today (instructed to incorporate RUE into HEP once port incision further healed). Pt return demonstrating good understanding throughout. Pt reports feeling comfortable performing ADL/moblity tasks after return home. Daughter present and supportive throughout session as well. Pt anticipating d/c home today.   Follow Up Recommendations  No OT follow up;Supervision - Intermittent    Equipment Recommendations  None recommended by OT          Precautions / Restrictions Precautions Precautions: Fall Restrictions Weight Bearing Restrictions: No       Mobility Bed Mobility Overal bed mobility: Modified Independent                                       ADL either performed or assessed with clinical judgement   ADL Overall ADL's : At baseline                                       General ADL Comments: issued level 1 theraband today and instructed in basic HEP (focus on LUE given recent port in R chest region); educated to initially perform using LUE only and progress to bil UEs once port site healed  further                        Cognition Arousal/Alertness: Awake/alert Behavior During Therapy: WFL for tasks assessed/performed Overall Cognitive Status: Within Functional Limits for tasks assessed                                          Exercises Exercises: Other exercises Other Exercises Other Exercises: issued level 1 theraband and instructed in basic HEP, pt return demonstrating    Shoulder Instructions       General Comments      Pertinent Vitals/ Pain       Pain Assessment: No/denies pain  Home Living                                          Prior Functioning/Environment              Frequency  Min 2X/week        Progress Toward Goals  OT Goals(current goals can now be found in the care plan section)  Progress towards OT goals: Progressing toward goals  Acute Rehab  OT Goals Patient Stated Goal: return home, maintain her independence OT Goal Formulation: With patient Time For Goal Achievement: 12/31/19 Potential to Achieve Goals: Good ADL Goals Pt Will Perform Lower Body Dressing: with modified independence;sit to/from stand Pt/caregiver will Perform Home Exercise Program: Increased strength;Both right and left upper extremity;With written HEP provided;With theraband Additional ADL Goal #1: Pt will independently verbalize/demonstrate at least energy conservation techniques during ADL/functional task.  Plan Discharge plan remains appropriate    Co-evaluation                 AM-PAC OT "6 Clicks" Daily Activity     Outcome Measure   Help from another person eating meals?: None Help from another person taking care of personal grooming?: None Help from another person toileting, which includes using toliet, bedpan, or urinal?: None Help from another person bathing (including washing, rinsing, drying)?: A Little Help from another person to put on and taking off regular upper body clothing?: None Help  from another person to put on and taking off regular lower body clothing?: A Little 6 Click Score: 22    End of Session    OT Visit Diagnosis: Muscle weakness (generalized) (M62.81)   Activity Tolerance Patient tolerated treatment well   Patient Left in bed;with call bell/phone within reach;with family/visitor present   Nurse Communication Mobility status        Time: [id number] OT Time Calculation (min): 14 min  Charges: OT General Charges $OT Visit: 1 Visit OT Treatments $Therapeutic Activity: 8-22 mins  Lou Cal, OT Acute Rehabilitation Services Pager [phone] Office [place] 12/20/2019, 1:21 PM

## 2019-12-22 ENCOUNTER — Encounter: Payer: Self-pay | Admitting: Anesthesiology

## 2019-12-22 ENCOUNTER — Other Ambulatory Visit: Payer: Self-pay | Admitting: Radiology

## 2019-12-22 NOTE — Anesthesia Postprocedure Evaluation (Signed)
Anesthesia Post Note  Patient: Sherald Hess  Procedure(s) Performed: INSERTION PORT-A-CATH WITH ULTRASOUND (N/A Chest)     Patient location during evaluation: PACU Anesthesia Type: General Level of consciousness: awake and alert Pain management: pain level controlled Vital Signs Assessment: post-procedure vital signs reviewed and stable Respiratory status: spontaneous breathing, nonlabored ventilation, respiratory function stable and patient connected to nasal cannula oxygen Cardiovascular status: blood pressure returned to baseline and stable Postop Assessment: no apparent nausea or vomiting Anesthetic complications: no    Last Vitals:  Vitals:   12/20/19 0526 12/20/19 0841  BP: (!) 151/72 (!) 155/67  Pulse: 80 76  Resp: 18   Temp: 36.7 C   SpO2: 95%     Last Pain:  Vitals:   12/20/19 0837  TempSrc:   PainSc: 0-No pain                 Alaina Donati

## 2019-12-22 NOTE — Anesthesia Preprocedure Evaluation (Signed)
Anesthesia Evaluation  Patient identified by MRN, date of birth, ID band Patient awake    Reviewed: Allergy & Precautions, NPO status , Patient's Chart, lab work & pertinent test results  History of Anesthesia Complications Negative for: history of anesthetic complications  Airway Mallampati: II  TM Distance: >3 FB Neck ROM: Full    Dental  (+) Dental Advisory Given   Pulmonary neg recent URI, former smoker,    breath sounds clear to auscultation       Cardiovascular negative cardio ROS   Rhythm:Regular     Neuro/Psych negative neurological ROS  negative psych ROS   GI/Hepatic negative GI ROS, Neg liver ROS,   Endo/Other  negative endocrine ROS  Renal/GU negative Renal ROS     Musculoskeletal   Abdominal   Peds  Hematology  (+) Blood dyscrasia, anemia ,   Anesthesia Other Findings   Reproductive/Obstetrics                             Anesthesia Physical Anesthesia Plan  ASA: II  Anesthesia Plan: General   Post-op Pain Management:    Induction: Intravenous  PONV Risk Score and Plan: 3 and Ondansetron and Dexamethasone  Airway Management Planned: LMA  Additional Equipment: None  Intra-op Plan:   Post-operative Plan: Extubation in OR  Informed Consent: I have reviewed the patients History and Physical, chart, labs and discussed the procedure including the risks, benefits and alternatives for the proposed anesthesia with the patient or authorized representative who has indicated his/her understanding and acceptance.     Dental advisory given  Plan Discussed with: CRNA and Surgeon  Anesthesia Plan Comments:         Anesthesia Quick Evaluation

## 2019-12-23 ENCOUNTER — Encounter (HOSPITAL_COMMUNITY): Payer: Self-pay

## 2019-12-23 ENCOUNTER — Ambulatory Visit (HOSPITAL_COMMUNITY)
Admission: RE | Admit: 2019-12-23 | Discharge: 2019-12-23 | Disposition: A | Discharge status: Home / Self Care | Payer: BC Managed Care – PPO | Source: Ambulatory Visit | Attending: Internal Medicine | Admitting: Internal Medicine

## 2019-12-23 ENCOUNTER — Other Ambulatory Visit: Payer: Self-pay

## 2019-12-23 DIAGNOSIS — C787 Secondary malignant neoplasm of liver and intrahepatic bile duct: Secondary | ICD-10-CM | POA: Diagnosis not present

## 2019-12-23 DIAGNOSIS — Z880 Allergy status to penicillin: Secondary | ICD-10-CM | POA: Insufficient documentation

## 2019-12-23 DIAGNOSIS — D649 Anemia, unspecified: Secondary | ICD-10-CM | POA: Diagnosis not present

## 2019-12-23 DIAGNOSIS — C189 Malignant neoplasm of colon, unspecified: Secondary | ICD-10-CM | POA: Insufficient documentation

## 2019-12-23 DIAGNOSIS — Z806 Family history of leukemia: Secondary | ICD-10-CM | POA: Insufficient documentation

## 2019-12-23 DIAGNOSIS — K769 Liver disease, unspecified: Secondary | ICD-10-CM | POA: Diagnosis present

## 2019-12-23 DIAGNOSIS — Z87891 Personal history of nicotine dependence: Secondary | ICD-10-CM | POA: Insufficient documentation

## 2019-12-23 DIAGNOSIS — Z807 Family history of other malignant neoplasms of lymphoid, hematopoietic and related tissues: Secondary | ICD-10-CM | POA: Insufficient documentation

## 2019-12-23 DIAGNOSIS — Z882 Allergy status to sulfonamides status: Secondary | ICD-10-CM | POA: Diagnosis not present

## 2019-12-23 DIAGNOSIS — Z833 Family history of diabetes mellitus: Secondary | ICD-10-CM | POA: Diagnosis not present

## 2019-12-23 DIAGNOSIS — Z79899 Other long term (current) drug therapy: Secondary | ICD-10-CM | POA: Diagnosis not present

## 2019-12-23 DIAGNOSIS — Z8616 Personal history of COVID-19: Secondary | ICD-10-CM | POA: Insufficient documentation

## 2019-12-23 LAB — CBC
HCT: 27.6 % — ABNORMAL LOW (ref 36.0–46.0)
Hemoglobin: 7.9 g/dL — ABNORMAL LOW (ref 12.0–15.0)
MCH: 20.6 pg — ABNORMAL LOW (ref 26.0–34.0)
MCHC: 28.6 g/dL — ABNORMAL LOW (ref 30.0–36.0)
MCV: 72.1 fL — ABNORMAL LOW (ref 80.0–100.0)
Platelets: 495 10*3/uL — ABNORMAL HIGH (ref 150–400)
RBC: 3.83 MIL/uL — ABNORMAL LOW (ref 3.87–5.11)
RDW: 26.6 % — ABNORMAL HIGH (ref 11.5–15.5)
WBC: 15.2 10*3/uL — ABNORMAL HIGH (ref 4.0–10.5)
nRBC: 0 % (ref 0.0–0.2)

## 2019-12-23 LAB — PROTIME-INR
INR: 1 (ref 0.8–1.2)
Prothrombin Time: 13 seconds (ref 11.4–15.2)

## 2019-12-23 MED ORDER — MIDAZOLAM HCL 2 MG/2ML IJ SOLN
INTRAMUSCULAR | Status: AC | PRN
  Administered 2019-12-23: 1 mg via INTRAVENOUS

## 2019-12-23 MED ORDER — MIDAZOLAM HCL 2 MG/2ML IJ SOLN
INTRAMUSCULAR | Status: AC
  Filled 2019-12-23: qty 2

## 2019-12-23 MED ORDER — SODIUM CHLORIDE 0.9 % IV SOLN: INTRAVENOUS | Status: DC

## 2019-12-23 MED ORDER — FENTANYL CITRATE (PF) 100 MCG/2ML IJ SOLN
INTRAMUSCULAR | Status: AC | PRN
  Administered 2019-12-23: 50 ug via INTRAVENOUS

## 2019-12-23 MED ORDER — FENTANYL CITRATE (PF) 100 MCG/2ML IJ SOLN
INTRAMUSCULAR | Status: AC
  Filled 2019-12-23: qty 2

## 2019-12-23 MED ORDER — LIDOCAINE HCL (PF) 1 % IJ SOLN
INTRAMUSCULAR | Status: AC
  Filled 2019-12-23: qty 30

## 2019-12-23 MED ORDER — GELATIN ABSORBABLE 12-7 MM EX MISC
CUTANEOUS | Status: AC
  Filled 2019-12-23: qty 1

## 2019-12-23 NOTE — Discharge Instructions (Addendum)

## 2019-12-23 NOTE — Procedures (Signed)
Interventional Radiology Procedure Note  Procedure: US guided liver mass biopsy.   Complications: None  Recommendations:  - Ok to shower tomorrow - 2 hr dc home - Do not submerge - Routine care   Signed,  Dulcy Fanny. Earleen Newport, DO

## 2019-12-23 NOTE — H&P (Signed)
Chief Complaint: Patient was seen in consultation today for liver lesion biopsy at the request of Stacy Burns,Stacy Burns  Referring Physician(s): Geradine Girt  Supervising Physician: Corrie Mckusick  Patient Status: [facility] - Out-pt  History of Present Illness: Stacy Burns is a 74 y.o. female   Hx Covid infection Hospitalized Jan 2021 Continued to be weak and abd pain Unintended wt loss PCP determined anemia and re admitted to [facility]  CT 5/4: IMPRESSION: 1. Focal area of wall thickening and nodularity involving the cecum and ascending colon with probable degree of stricture just above the ileocecal valve most consistent with malignancy. Further evaluation with colonoscopy is recommended. 2. Cirrhosis with innumerable hepatic metastatic disease. 3. No bowel obstruction. 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 COLON, BIOPSY:  - Invasive adenocarcinoma.   Dr Benay Spice Oncology requesting liver biopsy Was IP just last week and was scheduled for liver biopsy then But was discharged to come back as OP     Past Medical History:  Diagnosis Date  . COVID-19 virus infection 08/2019   not hospitalized.    . Severe anemia 12/17/2019    Past Surgical History:  Procedure Laterality Date  . BIOPSY  12/18/2019   Procedure: BIOPSY;  Surgeon: Thornton Park, MD;  Location: [place];  Service: Gastroenterology;;  . COLONOSCOPY WITH PROPOFOL N/A 12/18/2019   Procedure: COLONOSCOPY WITH PROPOFOL;  Surgeon: Thornton Park, MD;  Location: [place];  Service: Gastroenterology;  Laterality: N/A;  . ESOPHAGOGASTRODUODENOSCOPY (EGD) WITH PROPOFOL N/A 12/18/2019   Procedure: ESOPHAGOGASTRODUODENOSCOPY (EGD) WITH PROPOFOL;  Surgeon: Thornton Park, MD;  Location: [place];  Service: Gastroenterology;  Laterality: N/A;  . PORTACATH PLACEMENT N/A 12/19/2019   Procedure: INSERTION PORT-A-CATH WITH ULTRASOUND;  Surgeon: Rolm Bookbinder, MD;  Location: [place];  Service:  General;  Laterality: N/A;  . SUBMUCOSAL TATTOO INJECTION  12/18/2019   Procedure: SUBMUCOSAL TATTOO INJECTION;  Surgeon: Thornton Park, MD;  Location: [place];  Service: Gastroenterology;;  . TUBAL LIGATION      Allergies: Milk-related compounds, Penicillins, Pineapple, Chocolate, Other, and Sulfa antibiotics  Medications: Prior to Admission medications   Medication Sig Start Date End Date Taking? Authorizing Provider  ferrous sulfate 325 (65 FE) MG tablet Take 1 tablet (325 mg total) by mouth 2 (two) times daily with a meal. 12/20/19  Yes Stacy Burns, Stacy U, DO  acetaminophen (TYLENOL) 325 MG tablet Take 325 mg by mouth every 6 (six) hours as needed for mild pain or headache.    [provider]     Family History  Problem Relation Age of Onset  . Multiple myeloma Mother   . Diabetes Mellitus II Mother   . Leukemia Father   . Muscular dystrophy Brother     Social History   Socioeconomic History  . Marital status: Widowed    Spouse name: Not on file  . Number of children: 2  . Years of education: Not on file  . Highest education level: Not on file  Occupational History  . Not on file  Tobacco Use  . Smoking status: Former Smoker    Years: 25.00    Types: Cigarettes    Quit date: 08/14/1984    Years since quitting: 35.3  . Smokeless tobacco: Never Used  Substance and Sexual Activity  . Alcohol use: Not Currently  . Drug use: Never  . Sexual activity: Not on file  Other Topics Concern  . Not on file  Social History Narrative  . Not on file   Social  Determinants of Health   Financial Resource Strain:   . Difficulty of Paying Living Expenses:   Food Insecurity:   . Worried About Charity fundraiser in the Last Year:   . Arboriculturist in the Last Year:   Transportation Needs:   . Film/video editor (Medical):   Marland Kitchen Lack of Transportation (Non-Medical):   Physical Activity:   . Days of Exercise per Week:   . Minutes of Exercise per Session:     Stress:   . Feeling of Stress :   Social Connections:   . Frequency of Communication with Friends and Family:   . Frequency of Social Gatherings with Friends and Family:   . Attends Religious Services:   . Active Member of Clubs or Organizations:   . Attends Archivist Meetings:   Marland Kitchen Marital Status:     Review of Systems: A 12 point ROS discussed and pertinent positives are indicated in the HPI above.  All other systems are negative.  Review of Systems  Constitutional: Positive for activity change, fatigue and unexpected weight change. Negative for fever.  Respiratory: Negative for cough and shortness of breath.   Gastrointestinal: Positive for abdominal distention, abdominal pain and nausea.  Neurological: Positive for weakness.  Psychiatric/Behavioral: Negative for behavioral problems and confusion.    Vital Signs: BP 107/63   Pulse 94   Resp 16   Ht 5' 7" (1.702 m)   Wt 175 lb (79.4 kg)   SpO2 100%   BMI 27.41 kg/m   Physical Exam Cardiovascular:     Rate and Rhythm: Normal rate and regular rhythm.     Heart sounds: Normal heart sounds.  Pulmonary:     Effort: Pulmonary effort is normal.     Breath sounds: Normal breath sounds.  Abdominal:     Palpations: Abdomen is soft.     Tenderness: There is no abdominal tenderness.  Musculoskeletal:        General: Normal range of motion.  Skin:    General: Skin is warm and dry.  Neurological:     Mental Status: She is alert and oriented to person, place, and time.  Psychiatric:        Behavior: Behavior normal.        Thought Content: Thought content normal.        Judgment: Judgment normal.     Imaging: CT ABDOMEN PELVIS WO CONTRAST  Result Date: 12/16/2019 CLINICAL DATA:  74 year old female with anemia. Weight loss. Evaluate for malignancy. EXAM: CT ABDOMEN AND PELVIS WITHOUT CONTRAST TECHNIQUE: Multidetector CT imaging of the abdomen and pelvis was performed following the standard protocol without IV  contrast. COMPARISON:  None. FINDINGS: Evaluation of this exam is limited in the absence of intravenous contrast. Lower chest: There is a 5 mm left lower lobe calcified granuloma. The visualized lung bases are otherwise clear. No intra-abdominal free air or free fluid. Hepatobiliary: Cirrhosis. Multiple hepatic hypodense lesions measuring up to approximately 3.5 cm consistent with metastatic disease. No intrahepatic biliary ductal dilatation. The gallbladder is unremarkable. Pancreas: Unremarkable. No pancreatic ductal dilatation or surrounding inflammatory changes. Spleen: Normal in size without focal abnormality. Adrenals/Urinary Tract: The adrenal glands are unremarkable. The kidneys, and visualized ureters appear unremarkable. The urinary bladder is collapsed. Stomach/Bowel: There is an area of thickening of the proximal colon involving the cecum and ascending colon with probable degree of stricture just above the ileocecal valve most consistent with malignancy. There is stranding and haziness of the adjacent  fat with several nodular density adjacent to the cecum consistent with metastatic disease. The largest pericecal nodule or confluent of nodule medial to the cecum measures 20 x 17 mm (49/3). There is a small hiatal hernia. There is no bowel obstruction. The appendix is normal. Vascular/Lymphatic: Advanced aortoiliac atherosclerotic disease. The IVC is unremarkable. No portal venous gas. There is no adenopathy. Reproductive: The uterus and ovaries are grossly unremarkable. Other: None Musculoskeletal: None mild degenerative changes. No acute osseous pathology. IMPRESSION: 1. Focal area of wall thickening and nodularity involving the cecum and ascending colon with probable degree of stricture just above the ileocecal valve most consistent with malignancy. Further evaluation with colonoscopy is recommended. 2. Cirrhosis with innumerable hepatic metastatic disease. 3. No bowel obstruction. Normal appendix. 4.  Aortic Atherosclerosis (ICD10-I70.0). Electronically Signed   By: Anner Crete M.D.   On: 12/16/2019 22:23   DG Chest 2 View  Result Date: 12/12/2019 CLINICAL DATA:  History of COVID-19 positivity in January of 2021 with increasing shortness of breath EXAM: CHEST - 2 VIEW COMPARISON:  12/25/2013 FINDINGS: Cardiac shadow is within normal limits. Calcified granuloma is noted in left lung base. No sizable infiltrate or effusion is seen. No bony abnormality is noted. IMPRESSION: No active cardiopulmonary disease. Electronically Signed   By: Inez Catalina M.D.   On: 12/12/2019 20:36   DG CHEST PORT 1 VIEW  Result Date: 12/19/2019 CLINICAL DATA:  Postoperative evaluation. Anemia. Prior history of COVID. EXAM: PORTABLE CHEST 1 VIEW COMPARISON:  12/12/2019. FINDINGS: PowerPort catheter with tip over superior vena cava. No pneumothorax. Mild left base subsegmental atelectasis. No pleural effusion. Heart size normal. No acute bony abnormality. IMPRESSION: PowerPort catheter noted with tip over superior vena cava. No pneumothorax. Mild left base subsegmental atelectasis. Electronically Signed   By: Marcello Moores  Register   On: 12/19/2019 14:04   DG Fluoro Guide CV Line-No Report  Result Date: 12/19/2019 Fluoroscopy was utilized by the requesting physician.  No radiographic interpretation.   ECHOCARDIOGRAM COMPLETE  Result Date: 12/16/2019    ECHOCARDIOGRAM REPORT   Patient Name:   KARLYN GLASCO Date of Exam: 12/16/2019 Medical Rec #:  [id number]    Height:       67.0 in Accession #:    [id number]   Weight:       182.0 lb Date of Birth:  [birth year]     BSA:          1.943 m Patient Age:    60 years     BP:           136/68 mmHg Patient Gender: F            HR:           79 bpm. Exam Location:  Inpatient Procedure: 2D Echo, Color Doppler and Cardiac Doppler Indications:    R06.9 DOE  History:        Patient has no prior history of Echocardiogram examinations.                 COVID+ in Jan 2021.  Sonographer:    Raquel Sarna Senior  RDCS Referring Phys: [id number] [place]  Sonographer Comments: Suboptimal parasternal window. IMPRESSIONS  1. Left ventricular ejection fraction, by estimation, is 60 to 65%. The left ventricle has normal function. The left ventricle has no regional wall motion abnormalities. There is mild concentric left ventricular hypertrophy. Left ventricular diastolic parameters are indeterminate.  2. Right ventricular systolic function is normal. The right ventricular size is normal.  3. The mitral valve is normal in structure. Trivial mitral valve regurgitation. No evidence of mitral stenosis.  4. The aortic valve was not well visualized. Aortic valve regurgitation is not visualized. No aortic stenosis is present.  5. The inferior vena cava is dilated in size with >50% respiratory variability, suggesting right atrial pressure of 8 mmHg. Comparison(s): No prior Echocardiogram. Conclusion(s)/Recommendation(s): Normal biventricular function without evidence of hemodynamically significant valvular heart disease. FINDINGS  Left Ventricle: Left ventricular ejection fraction, by estimation, is 60 to 65%. The left ventricle has normal function. The left ventricle has no regional wall motion abnormalities. The left ventricular internal cavity size was normal in size. There is  mild concentric left ventricular hypertrophy. Left ventricular diastolic parameters are indeterminate. Right Ventricle: The right ventricular size is normal. No increase in right ventricular wall thickness. Right ventricular systolic function is normal. Left Atrium: Left atrial size was normal in size. Right Atrium: Right atrial size was normal in size. Pericardium: There is no evidence of pericardial effusion. Mitral Valve: The mitral valve is normal in structure. Moderate mitral annular calcification. Trivial mitral valve regurgitation. No evidence of mitral valve stenosis. Tricuspid Valve: The tricuspid valve is normal in structure. Tricuspid valve  regurgitation is trivial. No evidence of tricuspid stenosis. Aortic Valve: The aortic valve was not well visualized. Aortic valve regurgitation is not visualized. No aortic stenosis is present. Pulmonic Valve: The pulmonic valve was not well visualized. Pulmonic valve regurgitation is not visualized. No evidence of pulmonic stenosis. Aorta: The aortic root was not well visualized and the ascending aorta was not well visualized. Pulmonary Artery: The pulmonary artery is not well seen. Venous: The inferior vena cava is dilated in size with greater than 50% respiratory variability, suggesting right atrial pressure of 8 mmHg. IAS/Shunts: The atrial septum is grossly normal.  LEFT VENTRICLE PLAX 2D LVIDd:         4.37 cm  Diastology LVIDs:         2.83 cm  LV e' lateral:   9.03 cm/s LV PW:         1.32 cm  LV E/e' lateral: 10.5 LV IVS:        1.25 cm  LV e' medial:    6.96 cm/s LVOT diam:     2.10 cm  LV E/e' medial:  13.6 LV SV:         74 LV SV Index:   38 LVOT Area:     3.46 cm  RIGHT VENTRICLE RV S prime:     12.20 cm/s TAPSE (M-mode): 2.0 cm LEFT ATRIUM             Index       RIGHT ATRIUM           Index LA diam:        3.70 cm 1.90 cm/m  RA Area:     11.70 cm LA Vol (A2C):   61.9 ml 31.86 ml/m RA Volume:   24.00 ml  12.35 ml/m LA Vol (A4C):   51.2 ml 26.35 ml/m LA Biplane Vol: 55.9 ml 28.77 ml/m  AORTIC VALVE LVOT Vmax:   112.00 cm/s LVOT Vmean:  74.300 cm/s LVOT VTI:    0.214 m  AORTA Ao Root diam: 3.60 cm Ao Asc diam:  3.30 cm MITRAL VALVE MV Area (PHT): 2.76 cm    SHUNTS MV Decel Time: 275 msec    Systemic VTI:  0.21 m MV E velocity: 94.60 cm/s  Systemic Diam: 2.10 cm MV A  velocity: 97.50 cm/s MV E/A ratio:  0.97 Buford Dresser MD Electronically signed by Buford Dresser MD Signature Date/Time: 12/16/2019/2:19:48 PM    Final     Labs:  CBC: Recent Labs    12/17/19 0748 12/18/19 0113 12/19/19 0603 12/20/19 0914  WBC 11.8* 16.1* 12.6* 16.8*  HGB 7.6* 8.3* 7.6* 7.6*  HCT 26.6*  27.9* 27.2* 26.7*  PLT 510* 533* 475* 464*    COAGS: Recent Labs    12/18/19 1617  INR 1.1    BMP: Recent Labs    12/16/19 0559 12/17/19 0748 12/18/19 0113 12/19/19 0603  NA 137 136 134* 137  K 3.3* 4.2 4.5 4.5  CL 105 107 102 106  CO2 19* 21* 21* 21*  GLUCOSE 93 115* 160* 97  BUN _0 CALCIUM 8.3* 8.5* 8.8* 8.7*  CREATININE 0.97 1.01* 0.91 1.21*  GFRNONAA 58* 55* >60 44*  GFRAA >60 >60 >60 51*    LIVER FUNCTION TESTS: Recent Labs    12/15/19 1804 12/16/19 0559  BILITOT 0.5 1.1  AST 44* 41  ALT 31 28  ALKPHOS 192* 168*  PROT 6.7 5.7*  ALBUMIN 3.0* 2.6*    TUMOR MARKERS: No results for input(s): AFPTM, CEA, CA199, CHROMGRNA in the last 8760 hours.  Assessment and Plan:  Anemia  New Dx Colon Ca 12/18/19 Colon lesion and liver lesion on imaging Dr Benay Spice requesting liver lesion biopsy as well Scheduled today for same  Risks and benefits of liver lesion bx was discussed with the patient and/or patient's family including, but not limited to bleeding, infection, damage to adjacent structures or low yield requiring additional tests.  All of the questions were answered and there is agreement to proceed. Consent signed and in chart.   Thank you for this interesting consult.  I greatly enjoyed meeting Stacy Burns and look forward to participating in their care.  A copy of this report was sent to the requesting provider on this date.  Electronically Signed: Lavonia Drafts, PA-C 12/23/2019, 12:06 PM   I spent a total of  30 Minutes   in face to face in clinical consultation, greater than 50% of which was counseling/coordinating care for liver lesion bx

## 2019-12-24 LAB — SURGICAL PATHOLOGY

## 2019-12-26 ENCOUNTER — Inpatient Hospital Stay: Admit: 2019-12-26 | Payer: BC Managed Care – PPO | Admitting: Oncology

## 2019-12-26 ENCOUNTER — Encounter (HOSPITAL_COMMUNITY): Payer: Self-pay

## 2019-12-26 ENCOUNTER — Inpatient Hospital Stay (HOSPITAL_COMMUNITY)
Admission: EM | Admit: 2019-12-26 | Discharge: 2019-12-29 | DRG: 300 | Disposition: A | Discharge status: Home / Self Care | Payer: BC Managed Care – PPO | Source: Ambulatory Visit | Attending: Internal Medicine | Admitting: Internal Medicine

## 2019-12-26 ENCOUNTER — Inpatient Hospital Stay: Payer: BC Managed Care – PPO | Attending: Nurse Practitioner | Admitting: Nurse Practitioner

## 2019-12-26 ENCOUNTER — Ambulatory Visit (HOSPITAL_BASED_OUTPATIENT_CLINIC_OR_DEPARTMENT_OTHER)
Admission: RE | Admit: 2019-12-26 | Discharge: 2019-12-26 | Disposition: A | Discharge status: Home / Self Care | Payer: BC Managed Care – PPO | Source: Ambulatory Visit | Attending: Nurse Practitioner | Admitting: Nurse Practitioner

## 2019-12-26 ENCOUNTER — Inpatient Hospital Stay: Payer: BC Managed Care – PPO

## 2019-12-26 ENCOUNTER — Other Ambulatory Visit: Payer: Self-pay

## 2019-12-26 VITALS — BP 143/63 | HR 91 | Temp 98.3°F | Resp 18 | Ht 67.0 in | Wt 178.1 lb

## 2019-12-26 DIAGNOSIS — I82461 Acute embolism and thrombosis of right calf muscular vein: Principal | ICD-10-CM | POA: Diagnosis present

## 2019-12-26 DIAGNOSIS — Z91010 Allergy to peanuts: Secondary | ICD-10-CM

## 2019-12-26 DIAGNOSIS — Z820 Family history of epilepsy and other diseases of the nervous system: Secondary | ICD-10-CM

## 2019-12-26 DIAGNOSIS — I82401 Acute embolism and thrombosis of unspecified deep veins of right lower extremity: Secondary | ICD-10-CM | POA: Diagnosis not present

## 2019-12-26 DIAGNOSIS — D649 Anemia, unspecified: Secondary | ICD-10-CM

## 2019-12-26 DIAGNOSIS — Z888 Allergy status to other drugs, medicaments and biological substances status: Secondary | ICD-10-CM

## 2019-12-26 DIAGNOSIS — Z8616 Personal history of COVID-19: Secondary | ICD-10-CM

## 2019-12-26 DIAGNOSIS — D62 Acute posthemorrhagic anemia: Secondary | ICD-10-CM | POA: Diagnosis present

## 2019-12-26 DIAGNOSIS — K7460 Unspecified cirrhosis of liver: Secondary | ICD-10-CM | POA: Diagnosis present

## 2019-12-26 DIAGNOSIS — Z5111 Encounter for antineoplastic chemotherapy: Secondary | ICD-10-CM | POA: Insufficient documentation

## 2019-12-26 DIAGNOSIS — C787 Secondary malignant neoplasm of liver and intrahepatic bile duct: Secondary | ICD-10-CM | POA: Diagnosis present

## 2019-12-26 DIAGNOSIS — D500 Iron deficiency anemia secondary to blood loss (chronic): Secondary | ICD-10-CM | POA: Insufficient documentation

## 2019-12-26 DIAGNOSIS — I10 Essential (primary) hypertension: Secondary | ICD-10-CM | POA: Diagnosis present

## 2019-12-26 DIAGNOSIS — Z885 Allergy status to narcotic agent status: Secondary | ICD-10-CM

## 2019-12-26 DIAGNOSIS — Z91018 Allergy to other foods: Secondary | ICD-10-CM

## 2019-12-26 DIAGNOSIS — I452 Bifascicular block: Secondary | ICD-10-CM | POA: Diagnosis present

## 2019-12-26 DIAGNOSIS — Z87891 Personal history of nicotine dependence: Secondary | ICD-10-CM

## 2019-12-26 DIAGNOSIS — Z881 Allergy status to other antibiotic agents status: Secondary | ICD-10-CM

## 2019-12-26 DIAGNOSIS — Z833 Family history of diabetes mellitus: Secondary | ICD-10-CM

## 2019-12-26 DIAGNOSIS — Z95828 Presence of other vascular implants and grafts: Secondary | ICD-10-CM

## 2019-12-26 DIAGNOSIS — Z806 Family history of leukemia: Secondary | ICD-10-CM

## 2019-12-26 DIAGNOSIS — Z9851 Tubal ligation status: Secondary | ICD-10-CM

## 2019-12-26 DIAGNOSIS — Z91011 Allergy to milk products: Secondary | ICD-10-CM

## 2019-12-26 DIAGNOSIS — C189 Malignant neoplasm of colon, unspecified: Secondary | ICD-10-CM | POA: Diagnosis not present

## 2019-12-26 DIAGNOSIS — C182 Malignant neoplasm of ascending colon: Secondary | ICD-10-CM | POA: Insufficient documentation

## 2019-12-26 DIAGNOSIS — Z452 Encounter for adjustment and management of vascular access device: Secondary | ICD-10-CM | POA: Insufficient documentation

## 2019-12-26 DIAGNOSIS — I824Z1 Acute embolism and thrombosis of unspecified deep veins of right distal lower extremity: Secondary | ICD-10-CM

## 2019-12-26 DIAGNOSIS — Z880 Allergy status to penicillin: Secondary | ICD-10-CM

## 2019-12-26 DIAGNOSIS — I82409 Acute embolism and thrombosis of unspecified deep veins of unspecified lower extremity: Secondary | ICD-10-CM | POA: Diagnosis present

## 2019-12-26 DIAGNOSIS — I824Y1 Acute embolism and thrombosis of unspecified deep veins of right proximal lower extremity: Secondary | ICD-10-CM

## 2019-12-26 DIAGNOSIS — M7989 Other specified soft tissue disorders: Secondary | ICD-10-CM | POA: Diagnosis not present

## 2019-12-26 DIAGNOSIS — Z882 Allergy status to sulfonamides status: Secondary | ICD-10-CM

## 2019-12-26 DIAGNOSIS — Z807 Family history of other malignant neoplasms of lymphoid, hematopoietic and related tissues: Secondary | ICD-10-CM

## 2019-12-26 DIAGNOSIS — Z7901 Long term (current) use of anticoagulants: Secondary | ICD-10-CM | POA: Insufficient documentation

## 2019-12-26 LAB — BASIC METABOLIC PANEL
Anion gap: 9 (ref 5–15)
BUN: 19 mg/dL (ref 8–23)
CO2: 23 mmol/L (ref 22–32)
Calcium: 7.9 mg/dL — ABNORMAL LOW (ref 8.9–10.3)
Chloride: 101 mmol/L (ref 98–111)
Creatinine, Ser: 0.95 mg/dL (ref 0.44–1.00)
GFR calc Af Amer: >60 mL/min (ref >60)
GFR calc non Af Amer: 59 mL/min — ABNORMAL LOW (ref >60)
Glucose, Bld: 105 mg/dL — ABNORMAL HIGH (ref 70–99)
Potassium: 3.3 mmol/L — ABNORMAL LOW (ref 3.5–5.1)
Sodium: 133 mmol/L — ABNORMAL LOW (ref 135–145)

## 2019-12-26 LAB — CBC WITH DIFFERENTIAL (CANCER CENTER ONLY)
Abs Immature Granulocytes: 0.11 10*3/uL — ABNORMAL HIGH (ref 0.00–0.07)
Basophils Absolute: 0.1 10*3/uL (ref 0.0–0.1)
Basophils Relative: 0 %
Eosinophils Absolute: 0.4 10*3/uL (ref 0.0–0.5)
Eosinophils Relative: 3 %
HCT: 25.3 % — ABNORMAL LOW (ref 36.0–46.0)
Hemoglobin: 7.3 g/dL — ABNORMAL LOW (ref 12.0–15.0)
Immature Granulocytes: 1 %
Lymphocytes Relative: 9 %
Lymphs Abs: 1.2 10*3/uL (ref 0.7–4.0)
MCH: 20.7 pg — ABNORMAL LOW (ref 26.0–34.0)
MCHC: 28.9 g/dL — ABNORMAL LOW (ref 30.0–36.0)
MCV: 71.9 fL — ABNORMAL LOW (ref 80.0–100.0)
Monocytes Absolute: 1.6 10*3/uL — ABNORMAL HIGH (ref 0.1–1.0)
Monocytes Relative: 12 %
Neutro Abs: 10.2 10*3/uL — ABNORMAL HIGH (ref 1.7–7.7)
Neutrophils Relative %: 75 %
Platelet Count: 552 10*3/uL — ABNORMAL HIGH (ref 150–400)
RBC: 3.52 MIL/uL — ABNORMAL LOW (ref 3.87–5.11)
RDW: 27.5 % — ABNORMAL HIGH (ref 11.5–15.5)
WBC Count: 13.5 10*3/uL — ABNORMAL HIGH (ref 4.0–10.5)
nRBC: 0 % (ref 0.0–0.2)

## 2019-12-26 LAB — PROTIME-INR
INR: 1.1 (ref 0.8–1.2)
Prothrombin Time: 13.6 seconds (ref 11.4–15.2)

## 2019-12-26 LAB — APTT: aPTT: 28 seconds (ref 24–36)

## 2019-12-26 MED ORDER — MORPHINE SULFATE (PF) 2 MG/ML IV SOLN
1.0000 mg | INTRAVENOUS | Status: DC | PRN
  Administered 2019-12-27: 1 mg via INTRAVENOUS
  Filled 2019-12-26: qty 1

## 2019-12-26 MED ORDER — ACETAMINOPHEN 650 MG RE SUPP: 650.0000 mg | Freq: Four times a day (QID) | RECTAL | Status: DC | PRN

## 2019-12-26 MED ORDER — IBUPROFEN 200 MG PO TABS
400.0000 mg | ORAL_TABLET | ORAL | Status: DC | PRN
  Administered 2019-12-26 – 2019-12-27 (×2): 400 mg via ORAL
  Filled 2019-12-26 (×2): qty 2

## 2019-12-26 MED ORDER — ONDANSETRON HCL 4 MG/2ML IJ SOLN: 4.0000 mg | Freq: Four times a day (QID) | INTRAMUSCULAR | Status: DC | PRN

## 2019-12-26 MED ORDER — ONDANSETRON HCL 4 MG PO TABS: 4.0000 mg | ORAL_TABLET | Freq: Four times a day (QID) | ORAL | Status: DC | PRN

## 2019-12-26 MED ORDER — HEPARIN BOLUS VIA INFUSION
2000.0000 [IU] | Freq: Once | INTRAVENOUS | Status: AC
  Administered 2019-12-26: 2000 [IU] via INTRAVENOUS
  Filled 2019-12-26: qty 2000

## 2019-12-26 MED ORDER — FERROUS SULFATE 325 (65 FE) MG PO TABS
325.0000 mg | ORAL_TABLET | Freq: Two times a day (BID) | ORAL | Status: DC
  Administered 2019-12-27 – 2019-12-29 (×5): 325 mg via ORAL
  Filled 2019-12-26 (×5): qty 1

## 2019-12-26 MED ORDER — ACETAMINOPHEN 325 MG PO TABS: 650.0000 mg | ORAL_TABLET | Freq: Four times a day (QID) | ORAL | Status: DC | PRN

## 2019-12-26 MED ORDER — HEPARIN (PORCINE) 25000 UT/250ML-% IV SOLN
1150.0000 [IU]/h | INTRAVENOUS | Status: DC
  Administered 2019-12-26: 1150 [IU]/h via INTRAVENOUS
  Filled 2019-12-26: qty 250

## 2019-12-26 NOTE — ED Provider Notes (Signed)
Stacy Burns Provider Note   CSN: [id number] Arrival date & time: 12/26/19  1544     History Chief Complaint  Patient presents with  . DVT    Stacy Burns is a 74 y.o. female with a past medical history of colon cancer with mets, recent admission earlier this month for symptomatic anemia from occult GI bleed related to malignancy receiving 2 units of PRBCs, presenting to the ED at the request of her oncologist for a DVT.  Patient noted 2 days ago that her right lower extremity was slightly swollen.  She does admit since she was discharged in the hospital on 5 8 she had a "cramping sensation, I thought it was a charley horse" in her right lower extremity.  She went to her oncologist office today, underwent ultrasound found to have an extensive right lower extremity DVT.  She was sent to the ED for IV anticoagulation, possible filter placement and stabilization prior to possible removal of her colon mass next week.  Patient states that she has had dark stools since being in the hospital, states that prior to this she "did not pay attention to that stuff."  She reports shortness of breath that has been chronic "when my blood levels get low."  She denies any chest pain, injuries or falls, vomiting, diarrhea, urinary symptoms.  HPI     Past Medical History:  Diagnosis Date  . COVID-19 virus infection 08/2019   not hospitalized.    . Severe anemia 12/17/2019    Patient Active Problem List   Diagnosis Date Noted  . Acute DVT (deep venous thrombosis) ([place]) 12/26/2019  . Colonic mass   . 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 Metastatic disease ([place]) 12/17/2019  . GI bleed 12/17/2019  . Anemia 12/17/2019  . Symptomatic anemia 12/15/2019  . Hypokalemia 12/15/2019  . Positive fecal occult blood test 12/15/2019  . Leukocytosis 12/15/2019  . Bilateral lower extremity edema 12/15/2019  . Abnormal EKG 12/15/2019  . Elevated blood pressure reading 12/15/2019      Past Surgical History:  Procedure Laterality Date  . BIOPSY  12/18/2019   Procedure: BIOPSY;  Surgeon: Thornton Park, MD;  Location: [place];  Service: Gastroenterology;;  . COLONOSCOPY WITH PROPOFOL N/A 12/18/2019   Procedure: COLONOSCOPY WITH PROPOFOL;  Surgeon: Thornton Park, MD;  Location: [place];  Service: Gastroenterology;  Laterality: N/A;  . ESOPHAGOGASTRODUODENOSCOPY (EGD) WITH PROPOFOL N/A 12/18/2019   Procedure: ESOPHAGOGASTRODUODENOSCOPY (EGD) WITH PROPOFOL;  Surgeon: Thornton Park, MD;  Location: [place];  Service: Gastroenterology;  Laterality: N/A;  . PORTACATH PLACEMENT N/A 12/19/2019   Procedure: INSERTION PORT-A-CATH WITH ULTRASOUND;  Surgeon: Rolm Bookbinder, MD;  Location: [place];  Service: General;  Laterality: N/A;  . SUBMUCOSAL TATTOO INJECTION  12/18/2019   Procedure: SUBMUCOSAL TATTOO INJECTION;  Surgeon: Thornton Park, MD;  Location: [place];  Service: Gastroenterology;;  . TUBAL LIGATION       OB History   No obstetric history on file.     Family History  Problem Relation Age of Onset  . Multiple myeloma Mother   . Diabetes Mellitus II Mother   . Leukemia Father   . Muscular dystrophy Brother     Social History   Tobacco Use  . Smoking status: Former Smoker    Years: 25.00    Types: Cigarettes    Quit date: 08/14/1984    Years since quitting: 35.3  . Smokeless tobacco: Never Used  Substance Use Topics  . Alcohol use: Not Currently  .  Drug use: Never    Home Medications Prior to Admission medications   Medication Sig Start Date End Date Taking? Authorizing Provider  acetaminophen (TYLENOL) 325 MG tablet Take 325 mg by mouth every 6 (six) hours as needed for mild pain or headache.    [provider]  ferrous sulfate 325 (65 FE) MG tablet Take 1 tablet (325 mg total) by mouth 2 (two) times daily with a meal. 12/20/19   Stacy Bear U, DO    Allergies    Milk-related compounds, Penicillins, Pineapple,  Chocolate, Other, and Sulfa antibiotics  Review of Systems   Review of Systems  Constitutional: Negative for appetite change, chills and fever.  HENT: Negative for ear pain, rhinorrhea, sneezing and sore throat.   Eyes: Negative for photophobia and visual disturbance.  Respiratory: Positive for shortness of breath. Negative for cough, chest tightness and wheezing.   Cardiovascular: Positive for leg swelling. Negative for chest pain and palpitations.  Gastrointestinal: Negative for abdominal pain, blood in stool, constipation, diarrhea, nausea and vomiting.  Genitourinary: Negative for dysuria, hematuria and urgency.  Musculoskeletal: Negative for myalgias.  Skin: Negative for rash.  Neurological: Negative for dizziness, weakness and light-headedness.    Physical Exam Updated Vital Signs BP (!) 111/92   Pulse 96   Temp 98 F (36.7 C) (Oral)   Resp 12   Ht _0  (1.702 m)   Wt 80.7 kg   SpO2 100%   BMI 27.86 kg/m   Physical Exam Vitals and nursing note reviewed.  Constitutional:      General: She is not in acute distress.    Appearance: She is well-developed.  HENT:     Head: Normocephalic and atraumatic.     Nose: Nose normal.  Eyes:     General: No scleral icterus.       Right eye: No discharge.        Left eye: No discharge.     Conjunctiva/sclera: Conjunctivae normal.  Cardiovascular:     Rate and Rhythm: Normal rate and regular rhythm.     Heart sounds: Normal heart sounds. No murmur. No friction rub. No gallop.   Pulmonary:     Effort: Pulmonary effort is normal. No respiratory distress.     Breath sounds: Normal breath sounds.  Abdominal:     General: Bowel sounds are normal. There is no distension.     Palpations: Abdomen is soft.     Tenderness: There is no abdominal tenderness. There is no guarding.  Musculoskeletal:        General: Normal range of motion.     Cervical back: Normal range of motion and neck supple.     Right lower leg: Edema (Slight,  nonpitting) present.     Comments: 2+ DP pulses palpated bilaterally.  Skin:    General: Skin is warm and dry.     Findings: No rash.  Neurological:     Mental Status: She is alert.     Motor: No abnormal muscle tone.     Coordination: Coordination normal.     ED Results / Procedures / Treatments   Labs (all labs ordered are listed, but only abnormal results are displayed) Labs Reviewed  BASIC METABOLIC PANEL  APTT  PROTIME-INR  TYPE AND SCREEN    EKG None  Radiology VAS [place] LOWER EXTREMITY VENOUS (DVT)  Result Date: 12/26/2019  Lower Venous DVTStudy Indications: Edema, and patient reports sensation of "Charlie horse" for about one week. Recent 6 day hospital stay.  Comparison  Study: No prior study Performing Technologist: Maudry Mayhew MHA, RDMS, RVT, RDCS  Examination Guidelines: A complete evaluation includes B-mode imaging, spectral Doppler, color Doppler, and power Doppler as needed of all accessible portions of each vessel. Bilateral testing is considered an integral part of a complete examination. Limited examinations for reoccurring indications may be performed as noted. The reflux portion of the exam is performed with the patient in reverse Trendelenburg.  +---------+---------------+---------+-----------+----------+--------------+ RIGHT    CompressibilityPhasicitySpontaneityPropertiesThrombus Aging +---------+---------------+---------+-----------+----------+--------------+ CFV      Full           Yes      Yes                                 +---------+---------------+---------+-----------+----------+--------------+ SFJ      Full                                                        +---------+---------------+---------+-----------+----------+--------------+ FV Prox  Full                                                        +---------+---------------+---------+-----------+----------+--------------+ FV Mid   Full                                                         +---------+---------------+---------+-----------+----------+--------------+ FV DistalFull                                                        +---------+---------------+---------+-----------+----------+--------------+ PFV      Full                                                        +---------+---------------+---------+-----------+----------+--------------+ POP      Full           Yes      Yes                                 +---------+---------------+---------+-----------+----------+--------------+ PTV      Full                                                        +---------+---------------+---------+-----------+----------+--------------+ PERO     Full                                                        +---------+---------------+---------+-----------+----------+--------------+  Soleal   None                    No                   Acute          +---------+---------------+---------+-----------+----------+--------------+ Gastroc  None                    No                   Acute          +---------+---------------+---------+-----------+----------+--------------+   +----+---------------+---------+-----------+----------+--------------+ LEFTCompressibilityPhasicitySpontaneityPropertiesThrombus Aging +----+---------------+---------+-----------+----------+--------------+ CFV Full           Yes      Yes                                 +----+---------------+---------+-----------+----------+--------------+     Summary: RIGHT: - Findings consistent with acute deep vein thrombosis involving the right soleal veins, and right gastrocnemius veins. - No cystic structure found in the popliteal fossa.  LEFT: - No evidence of common femoral vein obstruction.  *See table(s) above for measurements and observations.    Preliminary     Procedures Procedures (including critical care time)  Medications Ordered in ED Medications    heparin bolus via infusion 2,000 Units (has no administration in time range)  heparin ADULT infusion 100 units/mL (25000 units/287m sodium chloride 0.45%) (has no administration in time range)    ED Course  I have reviewed the triage vital signs and the nursing notes.  Pertinent labs & imaging results that were available during my care of the patient were reviewed by me and considered in my medical decision making (see chart for details).    MDM Rules/Calculators/A&P                      74year old female with a past medical history of colon cancer with recent admission for symptomatic anemia secondary to occult GI bleed presenting to the ED from oncologist office for admission.  Found to have a right lower extremity DVT on ultrasound today.  She was sent for IV heparin, possible filter placement.  Patient reports shortness of breath that has been ongoing since her admission but denies any chest pain.  Patient is not hypoxic or tachycardic so I doubt PE.  She continues to endorse dark stools since admission.  Denies any diarrhea or constipation.  Lab work done today shows hemoglobin of 7.3.  Chart review shows that they plan to transfuse for less than 7.  Will admit patient to hospitalist service and begin IV heparin.   Portions of this note were generated with DLobbyist Dictation errors may occur despite best attempts at proofreading.  Final Clinical Impression(s) / ED Diagnoses Final diagnoses:  Malignant neoplasm of colon, unspecified part of colon ([place]  Acute deep vein thrombosis (DVT) of distal vein of right lower extremity ([facility]    Rx / DC Orders ED Discharge Orders    None       KDelia Heady PA-C 12/26/19 1738    MValarie Merino MD 12/27/19 1456

## 2019-12-26 NOTE — ED Triage Notes (Signed)
Pt is coming from the cancer center. Pt has had an ultrasound today and a DVT was found in the right leg. Pt is also complaining of some shortness of breath. Hx of colon cancer, recent liver biopsy, Gi bleeding, and anemia. No active treatments for cancer at this time.

## 2019-12-26 NOTE — H&P (Signed)
History and Physical    Stacy Burns:[id number] DOB: 09-11-1945 DOA: 12/26/2019  I have briefly reviewed the patient's prior medical records in [place]  PCP: Ferd Hibbs, NP  Patient coming from: home via the cancer center  Chief Complaint: Right lower extremity swelling  HPI: Stacy Burns is a 74 y.o. female with medical history significant of COVID-19 virus infection in January 2021, severe anemia recently found to have metastatic colon cancer presents to the hospital with complaints of right lower extremity swelling and pain over the last several days.  She was recently hospitalized just a week and a half ago with weakness, found to be significantly anemic and had a GI bleed.  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  She also was found to have liver mets.  Underwent liver biopsy on 12/23/2019 which showed adenocarcinoma as well suggesting metastatic disease.  Patient also had a port placed recently.  She was seen today in the cancer center and had right lower extremity swelling, underwent a Doppler and confirmed right lower extremity DVT.  Due to recent GI bleed she was directed to be admitted to the hospital and monitored while on heparin infusion.  Per oncology general surgery will also be consulted to consider resection of the primary tumor.  Currently patient denies any chest pain, does complain of shortness of breath that is somewhat chronic since her Covid in January, no lightheadedness or dizziness.  No abdominal pain, nausea or vomiting.  She denies any bright red blood per rectum however does report her stools have been black since she has been on iron.  ED Course: In the emergency room she is afebrile, normotensive, satting well on room air.  Blood work reveals a hemoglobin of 7.3 (it was 7.6 when she was discharged on 5/8 and 7.9 on 5/11).  She was started on a heparin infusion and we  are asked to admit  Review of Systems: All systems reviewed, and apart from HPI, all negative  Past Medical History:  Diagnosis Date  . COVID-19 virus infection 08/2019   not hospitalized.    . Severe anemia 12/17/2019    Past Surgical History:  Procedure Laterality Date  . BIOPSY  12/18/2019   Procedure: BIOPSY;  Surgeon: Thornton Park, MD;  Location: [place];  Service: Gastroenterology;;  . COLONOSCOPY WITH PROPOFOL N/A 12/18/2019   Procedure: COLONOSCOPY WITH PROPOFOL;  Surgeon: Thornton Park, MD;  Location: [place];  Service: Gastroenterology;  Laterality: N/A;  . ESOPHAGOGASTRODUODENOSCOPY (EGD) WITH PROPOFOL N/A 12/18/2019   Procedure: ESOPHAGOGASTRODUODENOSCOPY (EGD) WITH PROPOFOL;  Surgeon: Thornton Park, MD;  Location: [place];  Service: Gastroenterology;  Laterality: N/A;  . PORTACATH PLACEMENT N/A 12/19/2019   Procedure: INSERTION PORT-A-CATH WITH ULTRASOUND;  Surgeon: Rolm Bookbinder, MD;  Location: [place];  Service: General;  Laterality: N/A;  . SUBMUCOSAL TATTOO INJECTION  12/18/2019   Procedure: SUBMUCOSAL TATTOO INJECTION;  Surgeon: Thornton Park, MD;  Location: [place];  Service: Gastroenterology;;  . TUBAL LIGATION       reports that she quit smoking about 35 years ago. Her smoking use included cigarettes. She quit after 25.00 years of use. She has never used smokeless tobacco. She reports previous alcohol use. She reports that she does not use drugs.  Allergies  Allergen Reactions  . Milk-Related Compounds Nausea Only and Other (See Comments)    Delayed reaction if too much consumed   . Penicillins Anaphylaxis and Other (See  Comments)    Both parents had anaphylactic reactions to this  . Pineapple Swelling and Other (See Comments)    Tongue swells and causes acid bumps there, also  . Chocolate Other (See Comments)    Nasal congestion  . Other Itching, Rash and Other (See Comments)    "Most all antibiotics and OTC cold meds break me  out" Allergic to ALL NUTS, also = Itching  . Sulfa Antibiotics Rash and Other (See Comments)    Severe rash    Family History  Problem Relation Age of Onset  . Multiple myeloma Mother   . Diabetes Mellitus II Mother   . Leukemia Father   . Muscular dystrophy Brother     Prior to Admission medications   Medication Sig Start Date End Date Taking? Authorizing Provider  acetaminophen (TYLENOL) 325 MG tablet Take 325 mg by mouth every 6 (six) hours as needed for mild pain or headache.    [provider]  ferrous sulfate 325 (65 FE) MG tablet Take 1 tablet (325 mg total) by mouth 2 (two) times daily with a meal. 12/20/19   Geradine Girt, DO    Physical Exam: Vitals:   12/26/19 1558 12/26/19 1602  BP:  (!) 111/92  Pulse:  96  Resp:  12  Temp:  98 F (36.7 C)  TempSrc:  Oral  SpO2:  100%  Weight: 80.7 kg   Height: 5' 7" (1.702 m)    Constitutional: NAD, calm, comfortable Eyes: PERRL, lids and conjunctivae normal ENMT: Mucous membranes are moist.  Neck: normal, supple Respiratory: clear to auscultation bilaterally, no wheezing, no crackles. Normal respiratory effort.  Cardiovascular: Regular rate and rhythm, no murmurs / rubs / gallops.  Right lower extremity more swollen than left Abdomen: no tenderness, no masses palpated. Bowel sounds positive.  Musculoskeletal: no clubbing / cyanosis.  Skin: no rashes Neurologic: CN 2-12 grossly intact. Strength 5/5 in all 4.  Psychiatric: Normal judgment and insight. Alert and oriented x 3. Normal mood.   Labs on Admission: I have personally reviewed following labs and imaging studies  CBC: Recent Labs  Lab 12/20/19 0914 12/23/19 1225 12/26/19 0952  WBC 16.8* 15.2* 13.5*  NEUTROABS  --   --  10.2*  HGB 7.6* 7.9* 7.3*  HCT 26.7* 27.6* 25.3*  MCV 72.6* 72.1* 71.9*  PLT 464* 495* 967*   Basic Metabolic Panel: No results for input(s): NA, K, CL, CO2, GLUCOSE, BUN, CREATININE, CALCIUM, MG, PHOS in the last 168  hours. Liver Function Tests: No results for input(s): AST, ALT, ALKPHOS, BILITOT, PROT, ALBUMIN in the last 168 hours. Coagulation Profile: Recent Labs  Lab 12/23/19 1225  INR 1.0   BNP (last 3 results) No results for input(s): PROBNP in the last 8760 hours. CBG: No results for input(s): GLUCAP in the last 168 hours. Thyroid Function Tests: No results for input(s): TSH, T4TOTAL, FREET4, T3FREE, THYROIDAB in the last 72 hours. Urine analysis: No results found for: COLORURINE, APPEARANCEUR, [place], [place], GLUCOSEU, HGBUR, BILIRUBINUR, KETONESUR, PROTEINUR, UROBILINOGEN, NITRITE, LEUKOCYTESUR   Radiological Exams on Admission: VAS [place] LOWER EXTREMITY VENOUS (DVT)  Result Date: 12/26/2019  Lower Venous DVTStudy Indications: Edema, and patient reports sensation of "Charlie horse" for about one week. Recent 6 day hospital stay.  Comparison Study: No prior study Performing Technologist: Maudry Mayhew MHA, RDMS, RVT, RDCS  Examination Guidelines: A complete evaluation includes B-mode imaging, spectral Doppler, color Doppler, and power Doppler as needed of all accessible portions of each vessel. Bilateral testing is considered  an integral part of a complete examination. Limited examinations for reoccurring indications may be performed as noted. The reflux portion of the exam is performed with the patient in reverse Trendelenburg.  +---------+---------------+---------+-----------+----------+--------------+ RIGHT    CompressibilityPhasicitySpontaneityPropertiesThrombus Aging +---------+---------------+---------+-----------+----------+--------------+ CFV      Full           Yes      Yes                                 +---------+---------------+---------+-----------+----------+--------------+ SFJ      Full                                                        +---------+---------------+---------+-----------+----------+--------------+ FV Prox  Full                                                         +---------+---------------+---------+-----------+----------+--------------+ FV Mid   Full                                                        +---------+---------------+---------+-----------+----------+--------------+ FV DistalFull                                                        +---------+---------------+---------+-----------+----------+--------------+ PFV      Full                                                        +---------+---------------+---------+-----------+----------+--------------+ POP      Full           Yes      Yes                                 +---------+---------------+---------+-----------+----------+--------------+ PTV      Full                                                        +---------+---------------+---------+-----------+----------+--------------+ PERO     Full                                                        +---------+---------------+---------+-----------+----------+--------------+ Soleal   None  No                   Acute          +---------+---------------+---------+-----------+----------+--------------+ Gastroc  None                    No                   Acute          +---------+---------------+---------+-----------+----------+--------------+   +----+---------------+---------+-----------+----------+--------------+ LEFTCompressibilityPhasicitySpontaneityPropertiesThrombus Aging +----+---------------+---------+-----------+----------+--------------+ CFV Full           Yes      Yes                                 +----+---------------+---------+-----------+----------+--------------+     Summary: RIGHT: - Findings consistent with acute deep vein thrombosis involving the right soleal veins, and right gastrocnemius veins. - No cystic structure found in the popliteal fossa.  LEFT: - No evidence of common femoral vein obstruction.  *See table(s) above  for measurements and observations.    Preliminary     EKG: Independently reviewed.  EKG on 5/3 showed sinus rhythm, right bundle branch block, obtain another EKG  Assessment/Plan  Principal Problem Acute DVT-admit patient to the hospital, start intravenous heparin.  She is at high risk for bleeding, closely monitor.  Her hemoglobin is overall stable when compared to values in the past couple of weeks.  Transfuse for less than 7.  May need an IVC filter if bleeding continues  Active Problems Metastatic colon cancer-oncology saw patient in the office and referred her for admission here.  She is to start chemotherapy as an outpatient.  Per oncology notes, general surgery will also evaluate for resection of the primary tumor  Acute on chronic blood loss anemia-hemoglobin overall stable as above, continue to closely monitor, monitor for signs of bleeding and transfuse for hemoglobin less than 7  History of essential hypertension-normotensive here, does not appear to be on medication, monitor  Leukocytosis-noted during her prior hospital stay, afebrile, stable, nontoxic-appearing and no signs of infection.  Monitor, no need for antibiotics  DVT prophylaxis: heparin drip  Code Status: Full code  Family Communication: no family in the room Disposition Plan: home when ready  Bed Type: medsurg Consults called: oncology, general surgery   Obs/Inp: observation   Marzetta Board, MD, PhD [facility]  Contact via [email or web address]  12/26/2019, 5:33 PM

## 2019-12-26 NOTE — Progress Notes (Signed)
Right lower extremity venous duplex completed. Refer to "CV Proc" under chart review to view preliminary results.  Preliminary results discussed with Ned Card.  12/26/2019 12:45 PM Kelby Aline., MHA, RVT, RDCS, RDMS

## 2019-12-26 NOTE — Progress Notes (Signed)
ANTICOAGULATION CONSULT NOTE - Initial Consult  Pharmacy Consult for Heparin Indication: DVT  Allergies  Allergen Reactions  . Milk-Related Compounds Nausea Only and Other (See Comments)    Delayed reaction if too much consumed   . Penicillins Anaphylaxis and Other (See Comments)    Both parents had anaphylactic reactions to this  . Pineapple Swelling and Other (See Comments)    Tongue swells and causes acid bumps there, also  . Chocolate Other (See Comments)    Nasal congestion  . Other Itching, Rash and Other (See Comments)    "Most all antibiotics and OTC cold meds break me out" Allergic to ALL NUTS, also = Itching  . Sulfa Antibiotics Rash and Other (See Comments)    Severe rash    Patient Measurements: Height: 5\' 7"  (170.2 cm) Weight: 80.7 kg (177 lb 14.6 oz) IBW/kg (Calculated) : 61.6 Heparin Dosing Weight: 78 kg  Vital Signs: Temp: 98 F (36.7 C) (05/14 1602) Temp Source: Oral (05/14 1602) BP: 111/92 (05/14 1602) Pulse Rate: 96 (05/14 1602)  Labs: Recent Labs    12/26/19 0952  HGB 7.3*  HCT 25.3*  PLT 552*    Estimated Creatinine Clearance: 44.6 mL/min (A) (by C-G formula based on SCr of 1.21 mg/dL (H)).   Medical History: Past Medical History:  Diagnosis Date  . COVID-19 virus infection 08/2019   not hospitalized.    . Severe anemia 12/17/2019    Medications:  (Not in a hospital admission)  Assessment: 74 y/o F recently admitted with symptomatic anemia, heme positive stool found to have partially obstructing mass in ascending colon admitted from cancer center for VTE. Patient is anemic at baseline with evidence of ongoing bleeding.   Goal of Therapy:  Heparin level 0.3-0.7 units/ml Monitor platelets by anticoagulation protocol: Yes   Plan:  Give 2000 units bolus x 1 Start heparin infusion at 1150 units/hr Check anti-Xa level in 8 hours and daily while on heparin Continue to monitor H&H and platelets  Ulice Dash D 12/26/2019,5:38  PM

## 2019-12-26 NOTE — ED Notes (Signed)
Called to give report, no answer.

## 2019-12-26 NOTE — Progress Notes (Addendum)
[place] OFFICE PROGRESS NOTE   Diagnosis: Colon cancer  INTERVAL HISTORY:   [name] is a 74 year old woman recently hospitalized with symptomatic anemia.  She was found to have heme positive stool.  CT scan showed thickening of the cecum, cirrhosis and liver lesions.  She was found to have a partially obstructing mass in the ascending colon.  Biopsy showed invasive adenocarcinoma, preserved expression of the major MMR proteins.  Biopsy of a liver lesion on 12/23/2019 showed adenocarcinoma.  Bowels are moving.  She notes black stools since beginning oral iron.  She is not aware of any bleeding.  No nausea or vomiting.  Yesterday evening she noticed swelling of the right leg and calf pain.  She has dyspnea on exertion.  No chest pain.  Objective:  Vital signs in last 24 hours:  Blood pressure (!) 143/63, pulse 91, temperature 98.3 F (36.8 C), temperature source Temporal, resp. rate 18, height 5' 7" (1.702 m), weight 178 lb 1.6 oz (80.8 kg), SpO2 100 %.    Resp: Lungs clear bilaterally. Cardio: Regular rate and rhythm. GI: Abdomen is soft.  Liver palpable right upper abdomen with associated tenderness. Vascular: Edema right leg below the knee. Neuro: Alert and oriented. Skin: Pale.   Lab Results:  Lab Results  Component Value Date   WBC 13.5 (H) 12/26/2019   HGB 7.3 (L) 12/26/2019   HCT 25.3 (L) 12/26/2019   MCV 71.9 (L) 12/26/2019   PLT 552 (H) 12/26/2019   NEUTROABS 10.2 (H) 12/26/2019    Imaging:  No results found.  Medications: I have reviewed the patient's current medications.  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  Cirrhosis.  Innumerable liver lesions.  Colonoscopy 12/18/2019-ulcerated partially obstructing large mass in the mid ascending colon.  Biopsy invasive adenocarcinoma; preserved expression major MMR  proteins  Upper endoscopy 12/18/2019-normal esophagus, stomach, examined duodenum  CEA 12/19/2019-8,923  Biopsy liver lesion 12/23/2019-adenocarcinoma 2. Anemia secondary to GI blood loss 3. Cirrhosis  Disposition: Ms. Cressler has been diagnosed with colon cancer metastatic to the liver.  We reviewed the result from the recent liver biopsy with her and her daughter.  They understand the recommendation is to initiate chemotherapy.  Unfortunately she has an acute DVT right lower extremity in the setting of ongoing GI blood loss and severe anemia.  Dr. Benay Spice recommends hospitalization for anticoagulation with IV heparin, possible filter placement, transfusion support as needed, surgery consultation.  We are making arrangements for admission.  Patient seen with Dr. Benay Spice.   Ned Card ANP/GNP-BC   12/26/2019  10:35 AM This was a shared visit with Ned Card.  Ms. Guse was interviewed and examined.  Ms. Groseclose has metastatic colon cancer.  The liver biopsy confirmed metastatic disease.  Foundation 1 testing is pending.  The plan is to begin FOLFOX chemotherapy as soon as possible.  She presents today with right lower extremity swelling and a Doppler confirms a right lower extremity DVT.  She has anemia and clinical evidence of ongoing GI bleeding.  We discussed the difficult circumstance with Ms. Reddix and her daughter.  I discussed the case with Dr. Donne Hazel.  She will be admitted for heparin anticoagulation and close monitoring for increased bleeding.  The surgical service will be consulted to consider resection of the primary tumor.  She will need an IVC filter if surgery is planned in the near future.  The plan is  to begin FOLFOX chemotherapy when she has recovered from surgery.  I will check on her 12/29/2019.  Please call oncology as needed.  Julieanne Manson, MD

## 2019-12-27 DIAGNOSIS — I452 Bifascicular block: Secondary | ICD-10-CM | POA: Diagnosis present

## 2019-12-27 DIAGNOSIS — D509 Iron deficiency anemia, unspecified: Secondary | ICD-10-CM | POA: Diagnosis not present

## 2019-12-27 DIAGNOSIS — Z833 Family history of diabetes mellitus: Secondary | ICD-10-CM | POA: Diagnosis not present

## 2019-12-27 DIAGNOSIS — Z807 Family history of other malignant neoplasms of lymphoid, hematopoietic and related tissues: Secondary | ICD-10-CM | POA: Diagnosis not present

## 2019-12-27 DIAGNOSIS — Z880 Allergy status to penicillin: Secondary | ICD-10-CM | POA: Diagnosis not present

## 2019-12-27 DIAGNOSIS — Z91018 Allergy to other foods: Secondary | ICD-10-CM | POA: Diagnosis not present

## 2019-12-27 DIAGNOSIS — Z820 Family history of epilepsy and other diseases of the nervous system: Secondary | ICD-10-CM | POA: Diagnosis not present

## 2019-12-27 DIAGNOSIS — D62 Acute posthemorrhagic anemia: Secondary | ICD-10-CM | POA: Diagnosis present

## 2019-12-27 DIAGNOSIS — D649 Anemia, unspecified: Secondary | ICD-10-CM | POA: Diagnosis not present

## 2019-12-27 DIAGNOSIS — I82461 Acute embolism and thrombosis of right calf muscular vein: Secondary | ICD-10-CM | POA: Diagnosis present

## 2019-12-27 DIAGNOSIS — Z888 Allergy status to other drugs, medicaments and biological substances status: Secondary | ICD-10-CM | POA: Diagnosis not present

## 2019-12-27 DIAGNOSIS — Z806 Family history of leukemia: Secondary | ICD-10-CM | POA: Diagnosis not present

## 2019-12-27 DIAGNOSIS — Z91011 Allergy to milk products: Secondary | ICD-10-CM | POA: Diagnosis not present

## 2019-12-27 DIAGNOSIS — C189 Malignant neoplasm of colon, unspecified: Secondary | ICD-10-CM | POA: Diagnosis not present

## 2019-12-27 DIAGNOSIS — Z91010 Allergy to peanuts: Secondary | ICD-10-CM | POA: Diagnosis not present

## 2019-12-27 DIAGNOSIS — Z881 Allergy status to other antibiotic agents status: Secondary | ICD-10-CM | POA: Diagnosis not present

## 2019-12-27 DIAGNOSIS — Z9851 Tubal ligation status: Secondary | ICD-10-CM | POA: Diagnosis not present

## 2019-12-27 DIAGNOSIS — C787 Secondary malignant neoplasm of liver and intrahepatic bile duct: Secondary | ICD-10-CM | POA: Diagnosis present

## 2019-12-27 DIAGNOSIS — I824Z1 Acute embolism and thrombosis of unspecified deep veins of right distal lower extremity: Secondary | ICD-10-CM | POA: Diagnosis not present

## 2019-12-27 DIAGNOSIS — Z95828 Presence of other vascular implants and grafts: Secondary | ICD-10-CM | POA: Diagnosis not present

## 2019-12-27 DIAGNOSIS — I82401 Acute embolism and thrombosis of unspecified deep veins of right lower extremity: Secondary | ICD-10-CM | POA: Diagnosis present

## 2019-12-27 DIAGNOSIS — I10 Essential (primary) hypertension: Secondary | ICD-10-CM | POA: Diagnosis present

## 2019-12-27 DIAGNOSIS — Z885 Allergy status to narcotic agent status: Secondary | ICD-10-CM | POA: Diagnosis not present

## 2019-12-27 DIAGNOSIS — Z8616 Personal history of COVID-19: Secondary | ICD-10-CM | POA: Diagnosis not present

## 2019-12-27 DIAGNOSIS — K7460 Unspecified cirrhosis of liver: Secondary | ICD-10-CM | POA: Diagnosis present

## 2019-12-27 DIAGNOSIS — Z882 Allergy status to sulfonamides status: Secondary | ICD-10-CM | POA: Diagnosis not present

## 2019-12-27 DIAGNOSIS — Z87891 Personal history of nicotine dependence: Secondary | ICD-10-CM | POA: Diagnosis not present

## 2019-12-27 DIAGNOSIS — C182 Malignant neoplasm of ascending colon: Secondary | ICD-10-CM | POA: Diagnosis present

## 2019-12-27 LAB — CBC
HCT: 23.9 % — ABNORMAL LOW (ref 36.0–46.0)
HCT: 27.1 % — ABNORMAL LOW (ref 36.0–46.0)
Hemoglobin: 6.8 g/dL — CL (ref 12.0–15.0)
Hemoglobin: 8 g/dL — ABNORMAL LOW (ref 12.0–15.0)
MCH: 20.9 pg — ABNORMAL LOW (ref 26.0–34.0)
MCH: 22.3 pg — ABNORMAL LOW (ref 26.0–34.0)
MCHC: 28.5 g/dL — ABNORMAL LOW (ref 30.0–36.0)
MCHC: 29.5 g/dL — ABNORMAL LOW (ref 30.0–36.0)
MCV: 73.5 fL — ABNORMAL LOW (ref 80.0–100.0)
MCV: 75.7 fL — ABNORMAL LOW (ref 80.0–100.0)
Platelets: 530 10*3/uL — ABNORMAL HIGH (ref 150–400)
Platelets: 519 10*3/uL — ABNORMAL HIGH (ref 150–400)
RBC: 3.25 MIL/uL — ABNORMAL LOW (ref 3.87–5.11)
RBC: 3.58 MIL/uL — ABNORMAL LOW (ref 3.87–5.11)
RDW: 27.4 % — ABNORMAL HIGH (ref 11.5–15.5)
WBC: 13.5 10*3/uL — ABNORMAL HIGH (ref 4.0–10.5)
WBC: 15.1 10*3/uL — ABNORMAL HIGH (ref 4.0–10.5)
nRBC: 0 % (ref 0.0–0.2)
nRBC: 0 % (ref 0.0–0.2)

## 2019-12-27 LAB — PREPARE RBC (CROSSMATCH)

## 2019-12-27 LAB — COMPREHENSIVE METABOLIC PANEL
ALT: 22 U/L (ref 0–44)
AST: 44 U/L — ABNORMAL HIGH (ref 15–41)
Albumin: 2.3 g/dL — ABNORMAL LOW (ref 3.5–5.0)
Alkaline Phosphatase: 193 U/L — ABNORMAL HIGH (ref 38–126)
Anion gap: 9 (ref 5–15)
BUN: 17 mg/dL (ref 8–23)
CO2: 19 mmol/L — ABNORMAL LOW (ref 22–32)
Calcium: 7.9 mg/dL — ABNORMAL LOW (ref 8.9–10.3)
Chloride: 107 mmol/L (ref 98–111)
Creatinine, Ser: 0.87 mg/dL (ref 0.44–1.00)
GFR calc Af Amer: >60 mL/min (ref >60)
GFR calc non Af Amer: >60 mL/min (ref >60)
Glucose, Bld: 122 mg/dL — ABNORMAL HIGH (ref 70–99)
Potassium: 3.7 mmol/L (ref 3.5–5.1)
Sodium: 135 mmol/L (ref 135–145)
Total Bilirubin: 0.6 mg/dL (ref 0.3–1.2)
Total Protein: 5.7 g/dL — ABNORMAL LOW (ref 6.5–8.1)

## 2019-12-27 LAB — HEPARIN LEVEL (UNFRACTIONATED)
Heparin Unfractionated: 0.14 IU/mL — ABNORMAL LOW (ref 0.30–0.70)
Heparin Unfractionated: 0.22 IU/mL — ABNORMAL LOW (ref 0.30–0.70)
Heparin Unfractionated: 0.21 IU/mL — ABNORMAL LOW (ref 0.30–0.70)

## 2019-12-27 LAB — ABO/RH: ABO/RH(D): A POS

## 2019-12-27 MED ORDER — HEPARIN (PORCINE) 25000 UT/250ML-% IV SOLN
1700.0000 [IU]/h | INTRAVENOUS | Status: DC
  Administered 2019-12-27 – 2019-12-28 (×2): 1700 [IU]/h via INTRAVENOUS
  Filled 2019-12-27: qty 250

## 2019-12-27 MED ORDER — HEPARIN (PORCINE) 25000 UT/250ML-% IV SOLN
1300.0000 [IU]/h | INTRAVENOUS | Status: DC
  Administered 2019-12-27: 1300 [IU]/h via INTRAVENOUS
  Filled 2019-12-27: qty 250

## 2019-12-27 MED ORDER — CHLORHEXIDINE GLUCONATE CLOTH 2 % EX PADS
6.0000 | MEDICATED_PAD | Freq: Every day | CUTANEOUS | Status: DC
  Administered 2019-12-28: 6 via TOPICAL

## 2019-12-27 MED ORDER — HEPARIN (PORCINE) 25000 UT/250ML-% IV SOLN: 1450.0000 [IU]/h | INTRAVENOUS | Status: DC

## 2019-12-27 MED ORDER — SODIUM CHLORIDE 0.9% IV SOLUTION: Freq: Once | INTRAVENOUS | Status: AC

## 2019-12-27 NOTE — Progress Notes (Signed)
Pt's hbg - 6.8, no bleeding seen on pt, she is stable and not in any distress; MD notified; awaiting orders.

## 2019-12-27 NOTE — Progress Notes (Signed)
[facility] for Heparin Indication: DVT  Allergies  Allergen Reactions  . Milk-Related Compounds Nausea Only and Other (See Comments)    Delayed reaction if too much consumed   . Penicillins Anaphylaxis and Other (See Comments)    Both parents had anaphylactic reactions to this  . Pineapple Swelling and Other (See Comments)    Tongue swells and causes acid bumps there, also  . Chocolate Other (See Comments)    Nasal congestion  . Hydrocodone Nausea And Vomiting  . Other Itching, Rash and Other (See Comments)    "Most all antibiotics and OTC cold meds break me out" Allergic to ALL NUTS, also = Itching  . Sulfa Antibiotics Rash and Other (See Comments)    Severe rash   Patient Measurements: Height: 5\' 7"  (170.2 cm) Weight: 80.7 kg (177 lb 14.6 oz) IBW/kg (Calculated) : 61.6 Heparin Dosing Weight: 78 kg  Vital Signs: Temp: 98.1 F (36.7 C) (05/15 0916) Temp Source: Oral (05/15 0916) BP: 161/62 (05/15 0916) Pulse Rate: 69 (05/15 0916)  Labs: Recent Labs    12/26/19 0952 12/26/19 1640 12/26/19 1725 12/27/19 0216 12/27/19 1125  HGB 7.3*  --   --  6.8* 8.0*  HCT 25.3*  --   --  23.9* 27.1*  PLT 552*  --   --  530* 519*  APTT  --   --  28  --   --   LABPROT  --   --  13.6  --   --   INR  --   --  1.1  --   --   HEPARINUNFRC  --   --   --  0.14* 0.22*  CREATININE  --  0.95  --  0.87  --    Estimated Creatinine Clearance: 62 mL/min (by C-G formula based on SCr of 0.87 mg/dL).  Medical History: Past Medical History:  Diagnosis Date  . COVID-19 virus infection 08/2019   not hospitalized.    . Severe anemia 12/17/2019    Medications:  Medications Prior to Admission  Medication Sig Dispense Refill Last Dose  . ferrous sulfate 325 (65 FE) MG tablet Take 1 tablet (325 mg total) by mouth 2 (two) times daily with a meal. 60 tablet 0 12/25/2019 at Unknown time   Assessment: 74 y/o F recently admitted with symptomatic anemia, heme  positive stool found to have partially obstructing mass in ascending colon admitted from cancer center for RLE VTE. Patient is anemic at baseline with evidence of ongoing bleeding.  Heparin begun 5/14 with 2000 unit load, infusion at 1150 units/hr  Today, 12/27/2019 0200 Hep level 0.14, subtherapeutic, rate increased to 1300 units/he 1125 Hep level 0.22, still subtherapeutic, but increased - Hgb low, improved, Plt elevated. Transfusion x1 PRBC  Goal of Therapy:  Heparin level 0.3-0.5 units/ml, aiming lower range with anemia Monitor platelets by anticoagulation protocol: Yes   Plan:  Increase Heparin to 1450 units/hr Recheck Heparin level at 8pm Daily CBC, order daily Hep level at steady state Monitor for s/s severe bleed, patient notes dark stools  Minda Ditto PharmD 12/27/2019,12:42 PM

## 2019-12-27 NOTE — Progress Notes (Signed)
Pharmacy: [facility]  Patient is a 74 y.o F recently hospitalized with symptomatic anemia and partially obstructing mass in ascending colon, presented to [facility] on 12/26/19 from the cancer center for treatment of RLE DVT.  She's currently on heparin drip for acute VTE.    Dr Gearldine Shown note indicated that patient  "has anemia and clinical evidence of ongoing GI bleeding."  - heparin level is sub-therapeutic at 0.14  Goal of Therapy:  Heparin level 0.3-0.5 units/ml --> will aim for lower end of therapeutic range d/t high bleeding risk   Plan: - increase heparin drip to 1300 units/hr - check 8 hr heparin level - monitor cbc closely and for any s/sx bleeding  Dia Sitter, PharmD, BCPS 12/27/2019 3:16 AM

## 2019-12-27 NOTE — Progress Notes (Signed)
PROGRESS NOTE  Stacy Burns [id number] DOB: 24-Nov-1945 DOA: 12/26/2019 PCP: Ferd Hibbs, NP   LOS: 0 days   Brief Narrative / Interim history: 74 y.o. female with medical history significant of COVID-19 virus infection in January 2021, severe symptomatic anemia for which she was hospitalized 1-1/2 weeks ago, recently found to have metastatic colon cancer during that hospital stay, presents to the hospital with complaints of right lower extremity swelling and pain over the last several days.  She was recently hospitalized just a week and a half ago with weakness, found to be significantly anemic and had a GI bleed.  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  She also was found to have liver mets.  Underwent liver biopsy on 12/23/2019 which showed adenocarcinoma as well suggesting metastatic disease.  Patient also had a port placed recently.  She was seen 5/14 in the cancer center and had right lower extremity swelling, underwent a Doppler and confirmed right lower extremity DVT.  Due to recent GI bleed she was directed to be admitted to the hospital and monitored while on heparin infusion.  General surgery was also consulted for consideration for resection of the tumor  Subjective / 24h Interval events: She is doing well this morning.  Denies any bright red blood in her stools, no blood in the urine  Assessment & Plan: Principal Problem Acute DVT-patient was admitted and placed on heparin infusion.  There is no evidence of significant bleed today, continue heparin.  She appreciates that her left leg is better.  May need an IVC filter if she has recurrent bleeding  Active Problems Metastatic colon cancer-oncology saw patient in the office and referred her for admission here.  She is to start chemotherapy as an outpatient.  General surgery is currently evaluating for potential tumor resection  Acute on  chronic blood loss anemia-hemoglobin dropped to 6.8 this morning, no clear-cut evidence of bleed.  Continue heparin cautiously, continue to monitor and transfuse unit of packed red blood cells  History of essential hypertension-normotensive here, does not appear to be on medication, monitor  Leukocytosis-noted during her prior hospital stay, afebrile, stable, nontoxic-appearing and no signs of infection.  Monitor, no need for antibiotics  Scheduled Meds: . Chlorhexidine Gluconate Cloth  6 each Topical Daily  . ferrous sulfate  325 mg Oral BID WC   Continuous Infusions: . heparin 1,450 Units/hr (12/27/19 1253)   PRN Meds:.ibuprofen, morphine injection, ondansetron **OR** ondansetron (ZOFRAN) IV  DVT prophylaxis: heparin Code Status: Full code Family Communication: d/w patient   Status is: Inpatient  Remains inpatient appropriate because:Inpatient level of care appropriate due to severity of illness  Dispo: The patient is from: Home              Anticipated d/c is to: Home              Anticipated d/c date is: 3 days              Patient currently is not medically stable to d/c.  Consultants:  General surgery Oncology   Procedures:  None   Microbiology  None   Antimicrobials: None     Objective: Vitals:   12/27/19 0535 12/27/19 0630 12/27/19 0645 12/27/19 0916  BP: (!) 135/53 140/68 130/60 (!) 161/62  Pulse: 76 70 70 69  Resp: 18 18 19 20   Temp: 97.8 F (36.6 C) 98.2 F (36.8 C) 98.4 F (36.9 C) 98.1 F (36.7  C)  TempSrc: Oral Oral Oral Oral  SpO2: 98% 99% 99% 100%  Weight:      Height:        Intake/Output Summary (Last 24 hours) at 12/27/2019 1258 Last data filed at 12/27/2019 1244 Gross per 24 hour  Intake 701.58 ml  Output --  Net 701.58 ml   Filed Weights   12/26/19 1558  Weight: 80.7 kg    Examination: Constitutional: NAD Eyes: no scleral icterus ENMT: Mucous membranes are moist.  Neck: normal, supple Respiratory: clear to auscultation  bilaterally, no wheezing, no crackles. Normal respiratory effort. No accessory muscle use.  Cardiovascular: Regular rate and rhythm, no murmurs / rubs / gallops. No LE edema. Good peripheral pulses.  Left calf less swollen Abdomen: non distended, no tenderness. Bowel sounds positive.  Musculoskeletal: no clubbing / cyanosis.  Skin: no rashes Neurologic: CN 2-12 grossly intact. Strength 5/5 in all 4.  Psychiatric: Normal judgment and insight. Alert and oriented x 3. Normal mood.    Data Reviewed: I have independently reviewed following labs and imaging studies   CBC: Recent Labs  Lab 12/23/19 1225 12/26/19 0952 12/27/19 0216 12/27/19 1125  WBC 15.2* 13.5* 13.5* 15.1*  NEUTROABS  --  10.2*  --   --   HGB 7.9* 7.3* 6.8* 8.0*  HCT 27.6* 25.3* 23.9* 27.1*  MCV 72.1* 71.9* 73.5* 75.7*  PLT 495* 552* 530* [id number]*   Basic Metabolic Panel: Recent Labs  Lab 12/26/19 1640 12/27/19 0216  NA 133* 135  K 3.3* 3.7  CL 101 107  CO2 23 19*  GLUCOSE 105* 122*  BUN 19 17  CREATININE 0.95 0.87  CALCIUM 7.9* 7.9*   Liver Function Tests: Recent Labs  Lab 12/27/19 0216  AST 44*  ALT 22  ALKPHOS 193*  BILITOT 0.6  PROT 5.7*  ALBUMIN 2.3*   Coagulation Profile: Recent Labs  Lab 12/23/19 1225 12/26/19 1725  INR 1.0 1.1   HbA1C: No results for input(s): HGBA1C in the last 72 hours. CBG: No results for input(s): GLUCAP in the last 168 hours.  No results found for this or any previous visit (from the past 240 hour(s)).   Radiology Studies: No results found.   Marzetta Board, MD, PhD [facility]  Between 7 am - 7 pm I am available, please contact me via Amion or Securechat  Between 7 pm - 7 am I am not available, please contact night coverage MD/APP via Amion

## 2019-12-27 NOTE — Progress Notes (Signed)
Subjective/Chief Complaint: Pt admitted from cancer center yesterday after being found to have right leg swelling.  Duplex positive for RLE DVT.  She was admitted for initiation of anticoagulation.  Plan had been to start chemo for stage IV colon cancer, but given need for blood thinners, this may not be possible.    1 dark BM yesterday.  No dizziness or lightheadedness.  Still having some right leg pain and swelling.     Objective: Vital signs in last 24 hours: Temp:  [97.8 F (36.6 C)-100.9 F (38.3 C)] 98.4 F (36.9 C) (05/15 0645) Pulse Rate:  [70-96] 70 (05/15 0645) Resp:  [12-18] 18 (05/15 0630) BP: (111-148)/(53-92) 130/60 (05/15 0645) SpO2:  [96 %-100 %] 99 % (05/15 0645) Weight:  [80.7 kg-80.8 kg] 80.7 kg (05/14 1558) Last BM Date: 12/26/19  Intake/Output from previous day: 05/14 0701 - 05/15 0700 In: 66.6 [I.V.:66.6] Out: -  Intake/Output this shift: No intake/output data recorded.  General appearance: alert, cooperative, no distress and pale Resp: breathing comfortably Cardio: regular rate and rhythm GI: soft, non distended, non tender, no HSM Extremities: right leg wtih some swelling.  mild calf tenderness with dorsiflexion  Lab Results:  Recent Labs    12/26/19 0952 12/27/19 0216  WBC 13.5* 13.5*  HGB 7.3* 6.8*  HCT 25.3* 23.9*  PLT 552* 530*   BMET Recent Labs    12/26/19 1640 12/27/19 0216  NA 133* 135  K 3.3* 3.7  CL 101 107  CO2 23 19*  GLUCOSE 105* 122*  BUN 19 17  CREATININE 0.95 0.87  CALCIUM 7.9* 7.9*   PT/INR Recent Labs    12/26/19 1725  LABPROT 13.6  INR 1.1   ABG No results for input(s): PHART, HCO3 in the last 72 hours.  Invalid input(s): PCO2, PO2  Studies/Results: VAS [place] LOWER EXTREMITY VENOUS (DVT)  Result Date: 12/26/2019  Lower Venous DVTStudy Indications: Edema, and patient reports sensation of "Charlie horse" for about one week. Recent 6 day hospital stay.  Comparison Study: No prior study Performing  Technologist: Maudry Mayhew MHA, RDMS, RVT, RDCS  Examination Guidelines: A complete evaluation includes B-mode imaging, spectral Doppler, color Doppler, and power Doppler as needed of all accessible portions of each vessel. Bilateral testing is considered an integral part of a complete examination. Limited examinations for reoccurring indications may be performed as noted. The reflux portion of the exam is performed with the patient in reverse Trendelenburg.  +---------+---------------+---------+-----------+----------+--------------+ RIGHT    CompressibilityPhasicitySpontaneityPropertiesThrombus Aging +---------+---------------+---------+-----------+----------+--------------+ CFV      Full           Yes      Yes                                 +---------+---------------+---------+-----------+----------+--------------+ SFJ      Full                                                        +---------+---------------+---------+-----------+----------+--------------+ FV Prox  Full                                                        +---------+---------------+---------+-----------+----------+--------------+  FV Mid   Full                                                        +---------+---------------+---------+-----------+----------+--------------+ FV DistalFull                                                        +---------+---------------+---------+-----------+----------+--------------+ PFV      Full                                                        +---------+---------------+---------+-----------+----------+--------------+ POP      Full           Yes      Yes                                 +---------+---------------+---------+-----------+----------+--------------+ PTV      Full                                                        +---------+---------------+---------+-----------+----------+--------------+ PERO     Full                                                         +---------+---------------+---------+-----------+----------+--------------+ Soleal   None                    No                   Acute          +---------+---------------+---------+-----------+----------+--------------+ Gastroc  None                    No                   Acute          +---------+---------------+---------+-----------+----------+--------------+   +----+---------------+---------+-----------+----------+--------------+ LEFTCompressibilityPhasicitySpontaneityPropertiesThrombus Aging +----+---------------+---------+-----------+----------+--------------+ CFV Full           Yes      Yes                                 +----+---------------+---------+-----------+----------+--------------+     Summary: RIGHT: - Findings consistent with acute deep vein thrombosis involving the right soleal veins, and right gastrocnemius veins. - No cystic structure found in the popliteal fossa.  LEFT: - No evidence of common femoral vein obstruction.  *See table(s) above for measurements and observations.    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  She may end up needing colectomy for bleeding.  Continue heparin gtt.   Will monitor stools and H&H to make determination.     LOS: 0 days    Stark Klein 12/27/2019

## 2019-12-27 NOTE — Progress Notes (Signed)
Pharmacy: [facility]  Patient is a 74 y.o F with metastatic colon cancer recently hospitalized with symptomatic anemia and partially obstructing mass in ascending colon, presented to [facility] on 12/26/19 from the cancer center for treatment of RLE DVT.  She's currently on heparin drip for acute VTE.    Dr Gearldine Shown note indicated that patient  "has anemia and clinical evidence of ongoing GI bleeding."  - heparin level remains sub-therapeutic at 0.21  Goal of Therapy:  Heparin level 0.3-0.5 units/ml --> will aim for lower end of therapeutic range d/t high bleeding risk   Plan: - increase heparin drip to 1700 units/hr - check 8 hr heparin level - monitor cbc closely and for any s/sx bleeding  Dia Sitter, PharmD, BCPS 12/27/2019 9:28 PM

## 2019-12-28 LAB — CBC
HCT: 28.9 % — ABNORMAL LOW (ref 36.0–46.0)
Hemoglobin: 8.5 g/dL — ABNORMAL LOW (ref 12.0–15.0)
MCH: 22.3 pg — ABNORMAL LOW (ref 26.0–34.0)
MCHC: 29.4 g/dL — ABNORMAL LOW (ref 30.0–36.0)
MCV: 75.9 fL — ABNORMAL LOW (ref 80.0–100.0)
Platelets: 593 10*3/uL — ABNORMAL HIGH (ref 150–400)
RBC: 3.81 MIL/uL — ABNORMAL LOW (ref 3.87–5.11)
RDW: 27.3 % — ABNORMAL HIGH (ref 11.5–15.5)
WBC: 15.9 10*3/uL — ABNORMAL HIGH (ref 4.0–10.5)
nRBC: 0 % (ref 0.0–0.2)

## 2019-12-28 LAB — TYPE AND SCREEN
ABO/RH(D): A POS
Antibody Screen: NEGATIVE
Unit division: 0

## 2019-12-28 LAB — BPAM RBC
Blood Product Expiration Date: 202106032359
ISSUE DATE / TIME: 202105150624
Unit Type and Rh: 6200

## 2019-12-28 LAB — HEPARIN LEVEL (UNFRACTIONATED)
Heparin Unfractionated: 0.4 IU/mL (ref 0.30–0.70)
Heparin Unfractionated: 0.5 IU/mL (ref 0.30–0.70)

## 2019-12-28 MED ORDER — HEPARIN (PORCINE) 25000 UT/250ML-% IV SOLN
1600.0000 [IU]/h | INTRAVENOUS | Status: DC
  Administered 2019-12-28: 1600 [IU]/h via INTRAVENOUS
  Filled 2019-12-28 (×2): qty 250

## 2019-12-28 MED ORDER — BOOST / RESOURCE BREEZE PO LIQD CUSTOM
1.0000 | Freq: Two times a day (BID) | ORAL | Status: DC
  Administered 2019-12-29 (×2): 1 via ORAL

## 2019-12-28 MED ORDER — ADULT MULTIVITAMIN W/MINERALS CH
1.0000 | ORAL_TABLET | Freq: Every day | ORAL | Status: DC
  Administered 2019-12-28 – 2019-12-29 (×2): 1 via ORAL
  Filled 2019-12-28 (×2): qty 1

## 2019-12-28 NOTE — Progress Notes (Signed)
Pt stable at time of bedside rounding with brianna rn. No needs at this time. Rn will continue to monitor.

## 2019-12-28 NOTE — Progress Notes (Signed)
ANTICOAGULATION CONSULT NOTE - Follow Up Consult  Pharmacy Consult for heparin Indication: acute DVT  Allergies  Allergen Reactions  . Milk-Related Compounds Nausea Only and Other (See Comments)    Delayed reaction if too much consumed   . Penicillins Anaphylaxis and Other (See Comments)    Both parents had anaphylactic reactions to this  . Pineapple Swelling and Other (See Comments)    Tongue swells and causes acid bumps there, also  . Chocolate Other (See Comments)    Nasal congestion  . Hydrocodone Nausea And Vomiting  . Other Itching, Rash and Other (See Comments)    "Most all antibiotics and OTC cold meds break me out" Allergic to ALL NUTS, also = Itching  . Sulfa Antibiotics Rash and Other (See Comments)    Severe rash    Patient Measurements: Height: 5\' 7"  (170.2 cm) Weight: 80.7 kg (177 lb 14.6 oz) IBW/kg (Calculated) : 61.6 Heparin Dosing Weight: 78 kg  Vital Signs: Temp: 98.5 F (36.9 C) (05/15 2249) Temp Source: Oral (05/15 2249) BP: 163/69 (05/15 2007) Pulse Rate: 89 (05/15 2007)  Labs: Recent Labs    12/26/19 0952 12/26/19 1640 12/26/19 1725 12/27/19 0216 12/27/19 0216 12/27/19 1125 12/27/19 2015 12/28/19 0452  HGB  --   --   --  6.8*   < > 8.0*  --  8.5*  HCT   < >  --   --  23.9*  --  27.1*  --  28.9*  PLT  --   --   --  530*  --  519*  --  593*  APTT  --   --  28  --   --   --   --   --   LABPROT  --   --  13.6  --   --   --   --   --   INR  --   --  1.1  --   --   --   --   --   HEPARINUNFRC  --   --   --  0.14*  --  0.22* 0.21*  --   CREATININE  --  0.95  --  0.87  --   --   --   --    < > = values in this interval not displayed.    Estimated Creatinine Clearance: 62 mL/min (by C-G formula based on SCr of 0.87 mg/dL).   Assessment: Patient is a 74 y.o Fwith metastatic colon cancer recently hospitalized with symptomatic anemia and partially obstructing mass in ascending colon, presented to [facility] on 12/26/19 from the cancer center for  treatment of RLE DVT.  She's currently on heparin drip for acute VTE.    Dr Gearldine Shown note indicated that patient  "has anemia and clinical evidence of ongoing GI bleeding."  Today, 12/28/2019: - heparin level therapeutic at 0.40 with current rate if 1700 units/hr - hgb low but stable (s/p 1 unit PRBC on 5/15), plts 593  Goal of Therapy:  Heparin level 0.3-0.5 units/ml - using lower end of therapeutic range d/t high bleeding risk Monitor platelets by anticoagulation protocol: Yes   Plan:  - continue heparin drip at 1700 units/hr - re-check another level at 1pm to ensure it's still therapeutic before changing to daily monitoring - monitor cbc closely and for any s/sx bleeding  Pablo Mathurin P 12/28/2019,5:30 AM

## 2019-12-28 NOTE — Progress Notes (Signed)
Subjective/Chief Complaint: Pt admitted from cancer center yesterday after being found to have right leg swelling.  Duplex positive for RLE DVT.  She was admitted for initiation of anticoagulation.  Plan had been to start chemo for stage IV colon cancer, but given need for blood thinners, this may not be possible.    No bloody BMs yesterday.  Rec'd blood.  Appropriate bump in HCT.    Objective: Vital signs in last 24 hours: Temp:  [98.1 F (36.7 C)-100.6 F (38.1 C)] 98.2 F (36.8 C) (05/16 0600) Pulse Rate:  [69-89] 69 (05/16 0600) Resp:  [16-20] 16 (05/16 0600) BP: (140-169)/(62-70) 140/70 (05/16 0600) SpO2:  [97 %-100 %] 98 % (05/16 0600) Last BM Date: 12/27/19  Intake/Output from previous day: 05/15 0701 - 05/16 0700 In: 934.8 [P.O.:360; I.V.:179.8; Blood:395] Out: -  Intake/Output this shift: No intake/output data recorded.  General appearance: alert, cooperative, no distress.  Better color today.   Resp: breathing comfortably Cardio: regular rate and rhythm GI: soft, non distended, non tender, no HSM Extremities: right leg wtih some swelling.  Lab Results:  Recent Labs    12/27/19 1125 12/28/19 0452  WBC 15.1* 15.9*  HGB 8.0* 8.5*  HCT 27.1* 28.9*  PLT 519* 593*   BMET Recent Labs    12/26/19 1640 12/27/19 0216  NA 133* 135  K 3.3* 3.7  CL 101 107  CO2 23 19*  GLUCOSE 105* 122*  BUN 19 17  CREATININE 0.95 0.87  CALCIUM 7.9* 7.9*   PT/INR Recent Labs    12/26/19 1725  LABPROT 13.6  INR 1.1   ABG No results for input(s): PHART, HCO3 in the last 72 hours.  Invalid input(s): PCO2, PO2  Studies/Results: VAS [place] LOWER EXTREMITY VENOUS (DVT)  Result Date: 12/28/2019  Lower Venous DVTStudy Indications: Edema, and patient reports sensation of "Charlie horse" for about one week. Recent 6 day hospital stay.  Comparison Study: No prior study Performing Technologist: Maudry Mayhew MHA, RDMS, RVT, RDCS  Examination Guidelines: A complete  evaluation includes B-mode imaging, spectral Doppler, color Doppler, and power Doppler as needed of all accessible portions of each vessel. Bilateral testing is considered an integral part of a complete examination. Limited examinations for reoccurring indications may be performed as noted. The reflux portion of the exam is performed with the patient in reverse Trendelenburg.  +---------+---------------+---------+-----------+----------+--------------+ RIGHT    CompressibilityPhasicitySpontaneityPropertiesThrombus Aging +---------+---------------+---------+-----------+----------+--------------+ CFV      Full           Yes      Yes                                 +---------+---------------+---------+-----------+----------+--------------+ SFJ      Full                                                        +---------+---------------+---------+-----------+----------+--------------+ FV Prox  Full                                                        +---------+---------------+---------+-----------+----------+--------------+ FV Mid   Full                                                        +---------+---------------+---------+-----------+----------+--------------+  FV DistalFull                                                        +---------+---------------+---------+-----------+----------+--------------+ PFV      Full                                                        +---------+---------------+---------+-----------+----------+--------------+ POP      Full           Yes      Yes                                 +---------+---------------+---------+-----------+----------+--------------+ PTV      Full                                                        +---------+---------------+---------+-----------+----------+--------------+ PERO     Full                                                         +---------+---------------+---------+-----------+----------+--------------+ Soleal   None                    No                   Acute          +---------+---------------+---------+-----------+----------+--------------+ Gastroc  None                    No                   Acute          +---------+---------------+---------+-----------+----------+--------------+   +----+---------------+---------+-----------+----------+--------------+ LEFTCompressibilityPhasicitySpontaneityPropertiesThrombus Aging +----+---------------+---------+-----------+----------+--------------+ CFV Full           Yes      Yes                                 +----+---------------+---------+-----------+----------+--------------+     Summary: RIGHT: - Findings consistent with acute deep vein thrombosis involving the right soleal veins, and right gastrocnemius veins. - No cystic structure found in the popliteal fossa.  LEFT: - No evidence of common femoral vein obstruction.  *See table(s) above for measurements and observations. Electronically signed by Ruta Hinds MD on 12/28/2019 at 8:58:09 AM.    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  She may end up needing colectomy for bleeding.  Continue heparin gtt.    No bloody stools yesterday.  Continue 1 more day of heparin.  If no bleeding, could transition to oral anticoagulation or lovenox.  Surgery to follow.     LOS: 1 day    Stark Klein 12/28/2019

## 2019-12-28 NOTE — Progress Notes (Signed)
ANTICOAGULATION CONSULT NOTE - Follow Up Consult  Pharmacy Consult for heparin Indication: acute DVT  Allergies  Allergen Reactions  . Milk-Related Compounds Nausea Only and Other (See Comments)    Delayed reaction if too much consumed   . Penicillins Anaphylaxis and Other (See Comments)    Both parents had anaphylactic reactions to this  . Pineapple Swelling and Other (See Comments)    Tongue swells and causes acid bumps there, also  . Chocolate Other (See Comments)    Nasal congestion  . Hydrocodone Nausea And Vomiting  . Other Itching, Rash and Other (See Comments)    "Most all antibiotics and OTC cold meds break me out" Allergic to ALL NUTS, also = Itching  . Sulfa Antibiotics Rash and Other (See Comments)    Severe rash   Patient Measurements: Height: 5\' 7"  (170.2 cm) Weight: 80.7 kg (177 lb 14.6 oz) IBW/kg (Calculated) : 61.6 Heparin Dosing Weight: 78 kg  Vital Signs: Temp: 98.2 F (36.8 C) (05/16 0600) Temp Source: Oral (05/16 0600) BP: 140/70 (05/16 0600) Pulse Rate: 69 (05/16 0600)  Labs: Recent Labs    12/26/19 0952 12/26/19 1640 12/26/19 1725 12/27/19 0216 12/27/19 0216 12/27/19 1125 12/27/19 1125 12/27/19 2015 12/28/19 0452 12/28/19 1245  HGB  --   --   --  6.8*   < > 8.0*  --   --  8.5*  --   HCT   < >  --   --  23.9*  --  27.1*  --   --  28.9*  --   PLT  --   --   --  530*  --  519*  --   --  593*  --   APTT  --   --  28  --   --   --   --   --   --   --   LABPROT  --   --  13.6  --   --   --   --   --   --   --   INR  --   --  1.1  --   --   --   --   --   --   --   HEPARINUNFRC  --   --   --  0.14*   < > 0.22*   < > 0.21* 0.40 0.50  CREATININE  --  0.95  --  0.87  --   --   --   --   --   --    < > = values in this interval not displayed.   Estimated Creatinine Clearance: 62 mL/min (by C-G formula based on SCr of 0.87 mg/dL).  Assessment: Patient is a 74 y.o Fwith metastatic colon cancer recently hospitalized with symptomatic anemia and  partially obstructing mass in ascending colon, presented to [facility] on 12/26/19 from the cancer center for treatment of RLE DVT.  She's currently on heparin drip for acute VTE.    Dr Gearldine Shown note indicated that patient  "has anemia and clinical evidence of ongoing GI bleeding."  Today, 12/28/2019: 0500 heparin level therapeutic at 0.40 with current rate 1700 units/hr 1245 hep level 0.50, at high end of desired range with hx anemia - hgb low but stable (s/p 1 unit PRBC on 5/15), plts 593  Goal of Therapy:  Heparin level 0.3-0.5 units/ml - using lower end of therapeutic range d/t high bleeding risk Monitor platelets by anticoagulation protocol: Yes   Plan:  Reduce Heparin  to 1600 units/hr Daily CBC, daily Heparin level Monitor closely for s/s bleed  Minda Ditto PharmD 12/28/2019,1:13 PM

## 2019-12-28 NOTE — Progress Notes (Signed)
Initial Nutrition Assessment  DOCUMENTATION CODES:   Not applicable  INTERVENTION:    Boost Breeze po TID, each supplement provides 250 kcal and 9 grams of protein.  MVI with minerals daily.  NUTRITION DIAGNOSIS:   Increased nutrient needs related to acute illness as evidenced by estimated needs.  GOAL:   Patient will meet greater than or equal to 90% of their needs  MONITOR:   PO intake, Supplement acceptance, Labs  REASON FOR ASSESSMENT:   Malnutrition Screening Tool    ASSESSMENT:   74 yo female admitted with RLE swelling, + RLE DVT. PMH includes COVID-19 infection in January 2021, severe anemia, recently diagnosed with metastatic colon cancer (liver mets).  Patient is being followed by Surgery for potential resection of primary colon tumor.    Labs reviewed.  Medications reviewed and include ferrous sulfate.  Spoke with patient, who reports that her usual weight was 205 lbs in Dec 2020. She had COVID-19 in January and has been losing weight since then. Patient reports she has been unable to smell anything for 20 years. She is unsure why she lost her sense of smell. She also has decreased sense of taste, stating that most things just taste like cardboard. She has allergies to milk, pineapple, nuts, and chocolate. She does not drink milk and tries to limit foods that contain milk (e.g, muffins) to one serving every day or two. She requests a dairy-free margarine with meals if available. She uses Imperial brand margarine at home which she says is dairy free. Upon further investigation, Imperial margarine contains whey, which is a dairy product. She does not drink supplements because the milky supplements make her nauseous. She agreed to try a dairy free supplement [employer]). She says her appetite is pretty good. 30-100% meal intakes recorded 5/15.   13% reported weight loss within the past 5-6 months is significant for the time frame. Suspect malnutrition, however,  unable to obtain enough information at this time for identification of malnutrition.    NUTRITION - FOCUSED PHYSICAL EXAM:  unable to complete  Diet Order:   Diet Order            Diet regular Room service appropriate? Yes; Fluid consistency: Thin  Diet effective now              EDUCATION NEEDS:   No education needs have been identified at this time  Skin:  Skin Assessment: Reviewed RN Assessment  Last BM:  5/15  Height:   Ht Readings from Last 1 Encounters:  12/26/19 5\' 7"  (1.702 m)    Weight:   Wt Readings from Last 1 Encounters:  12/26/19 80.7 kg    Ideal Body Weight:  61.4 kg  BMI:  Body mass index is 27.86 kg/m.  Estimated Nutritional Needs:   Kcal:  2000-2200  Protein:  100-120 gm  Fluid:  >/= 2 L    Lucas Mallow, RD, LDN, CNSC Please refer to Amion for contact information.

## 2019-12-28 NOTE — Progress Notes (Signed)
PROGRESS NOTE  Stacy Burns [id number] DOB: [birth year] DOA: 12/26/2019 PCP: Ferd Hibbs, NP   LOS: 1 day   Brief Narrative / Interim history: 74 y.o. female with medical history significant of COVID-19 virus infection in January 2021, severe symptomatic anemia for which she was hospitalized 1-1/2 weeks ago, recently found to have metastatic colon cancer during that hospital stay, presents to the hospital with complaints of right lower extremity swelling and pain over the last several days.  She was recently hospitalized just a week and a half ago with weakness, found to be significantly anemic and had a GI bleed.  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  She also was found to have liver mets.  Underwent liver biopsy on 12/23/2019 which showed adenocarcinoma as well suggesting metastatic disease.  Patient also had a port placed recently.  She was seen 5/14 in the cancer center and had right lower extremity swelling, underwent a Doppler and confirmed right lower extremity DVT.  Due to recent GI bleed she was directed to be admitted to the hospital and monitored while on heparin infusion.  General surgery was also consulted for consideration for resection of the tumor  Subjective / 24h Interval events: No complaints this morning, appears comfortable.  No shortness of breath.  No chest pain.  Had a loose bowel movement yesterday but did not see any blood.  Assessment & Plan: Principal Problem Acute DVT-patient was admitted and placed on heparin infusion.  There is no evidence of significant bleed today, continue heparin.  Currently continues to improve, continue heparin  Active Problems Metastatic colon cancer-oncology saw patient in the office and referred her for admission here.  She is to start chemotherapy as an outpatient.  General surgery is currently evaluating for potential tumor resection.   Oncology consulted and will see in the morning  Acute on chronic blood loss anemia-hemoglobin dropped to 6.8 on 5/15, received a unit of packed red blood cells, improved appropriately to 8.0 and has remained stable at 8.5 this morning.  No evidence of bleeding.  History of essential hypertension-normotensive here, does not appear to be on medication, monitor  Leukocytosis-noted during her prior hospital stay, afebrile, stable, nontoxic-appearing and no signs of infection.  Monitor, no need for antibiotics  Scheduled Meds: . Chlorhexidine Gluconate Cloth  6 each Topical Daily  . ferrous sulfate  325 mg Oral BID WC   Continuous Infusions: . heparin 1,700 Units/hr (12/28/19 0525)   PRN Meds:.ibuprofen, morphine injection, ondansetron **OR** ondansetron (ZOFRAN) IV  DVT prophylaxis: heparin Code Status: Full code Family Communication: d/w patient   Status is: Inpatient  Remains inpatient appropriate because:Inpatient level of care appropriate due to severity of illness.  Continue to monitor blood counts while on heparin infusion and bleeding tumor  Dispo: The patient is from: Home              Anticipated d/c is to: Home              Anticipated d/c date is: 2 days              Patient currently is not medically stable to d/c.  Consultants:  General surgery Oncology   Procedures:  None   Microbiology  None   Antimicrobials: None     Objective: Vitals:   12/27/19 1807 12/27/19 2007 12/27/19 2249 12/28/19 0600  BP: (!) 155/67 (!) 163/69  140/70  Pulse: 85 89  69  Resp: 20 16  16   Temp: 100 F (37.8 C) (!) 100.6 F (38.1 C) 98.5 F (36.9 C) 98.2 F (36.8 C)  TempSrc: Oral Oral Oral Oral  SpO2: 99% 97%  98%  Weight:      Height:        Intake/Output Summary (Last 24 hours) at 12/28/2019 0950 Last data filed at 12/27/2019 1735 Gross per 24 hour  Intake 419.82 ml  Output --  Net 419.82 ml   Filed Weights   12/26/19 1558  Weight: 80.7 kg     Examination: Constitutional: No distress Eyes: No scleral icterus ENMT: Moist mucous membranes Neck: normal, supple Respiratory: Clear bilaterally without wheezing or crackles, normal respiratory effort without accessory muscle use Cardiovascular: Regular rate and rhythm, no murmurs appreciated.  No peripheral edema.  Left calf swelling almost completely resolved Abdomen: Soft, nontender, nondistended, bowel sounds positive Musculoskeletal: no clubbing / cyanosis.  Skin: No rashes appreciated Neurologic: No focal deficits, equal strength  Data Reviewed: I have independently reviewed following labs and imaging studies   CBC: Recent Labs  Lab 12/23/19 1225 12/26/19 0952 12/27/19 0216 12/27/19 1125 12/28/19 0452  WBC 15.2* 13.5* 13.5* 15.1* 15.9*  NEUTROABS  --  10.2*  --   --   --   HGB 7.9* 7.3* 6.8* 8.0* 8.5*  HCT 27.6* 25.3* 23.9* 27.1* 28.9*  MCV 72.1* 71.9* 73.5* 75.7* 75.9*  PLT 495* 552* 530* 519* 0000000*   Basic Metabolic Panel: Recent Labs  Lab 12/26/19 1640 12/27/19 0216  NA 133* 135  K 3.3* 3.7  CL 101 107  CO2 23 19*  GLUCOSE 105* 122*  BUN 19 17  CREATININE 0.95 0.87  CALCIUM 7.9* 7.9*   Liver Function Tests: Recent Labs  Lab 12/27/19 0216  AST 44*  ALT 22  ALKPHOS 193*  BILITOT 0.6  PROT 5.7*  ALBUMIN 2.3*   Coagulation Profile: Recent Labs  Lab 12/23/19 1225 12/26/19 1725  INR 1.0 1.1   HbA1C: No results for input(s): HGBA1C in the last 72 hours. CBG: No results for input(s): GLUCAP in the last 168 hours.  No results found for this or any previous visit (from the past 240 hour(s)).   Radiology Studies: No results found.   Marzetta Board, MD, PhD [facility]  Between 7 am - 7 pm I am available, please contact me via Amion or Securechat  Between 7 pm - 7 am I am not available, please contact night coverage MD/APP via Amion

## 2019-12-29 ENCOUNTER — Telehealth: Payer: Self-pay | Admitting: Emergency Medicine

## 2019-12-29 ENCOUNTER — Other Ambulatory Visit: Payer: Self-pay | Admitting: Oncology

## 2019-12-29 DIAGNOSIS — C182 Malignant neoplasm of ascending colon: Secondary | ICD-10-CM

## 2019-12-29 DIAGNOSIS — K7460 Unspecified cirrhosis of liver: Secondary | ICD-10-CM

## 2019-12-29 DIAGNOSIS — C787 Secondary malignant neoplasm of liver and intrahepatic bile duct: Secondary | ICD-10-CM

## 2019-12-29 DIAGNOSIS — D509 Iron deficiency anemia, unspecified: Secondary | ICD-10-CM

## 2019-12-29 DIAGNOSIS — Z7901 Long term (current) use of anticoagulants: Secondary | ICD-10-CM

## 2019-12-29 DIAGNOSIS — Z7189 Other specified counseling: Secondary | ICD-10-CM | POA: Insufficient documentation

## 2019-12-29 DIAGNOSIS — I82401 Acute embolism and thrombosis of unspecified deep veins of right lower extremity: Secondary | ICD-10-CM

## 2019-12-29 LAB — CBC
HCT: 26.7 % — ABNORMAL LOW (ref 36.0–46.0)
Hemoglobin: 8 g/dL — ABNORMAL LOW (ref 12.0–15.0)
MCH: 22.5 pg — ABNORMAL LOW (ref 26.0–34.0)
MCHC: 30 g/dL (ref 30.0–36.0)
MCV: 75.2 fL — ABNORMAL LOW (ref 80.0–100.0)
Platelets: 613 10*3/uL — ABNORMAL HIGH (ref 150–400)
RBC: 3.55 MIL/uL — ABNORMAL LOW (ref 3.87–5.11)
WBC: 18.9 10*3/uL — ABNORMAL HIGH (ref 4.0–10.5)
nRBC: 0 % (ref 0.0–0.2)

## 2019-12-29 LAB — BASIC METABOLIC PANEL
Anion gap: 8 (ref 5–15)
BUN: 15 mg/dL (ref 8–23)
CO2: 22 mmol/L (ref 22–32)
Calcium: 8.3 mg/dL — ABNORMAL LOW (ref 8.9–10.3)
Chloride: 106 mmol/L (ref 98–111)
Creatinine, Ser: 0.9 mg/dL (ref 0.44–1.00)
GFR calc Af Amer: >60 mL/min (ref >60)
GFR calc non Af Amer: >60 mL/min (ref >60)
Glucose, Bld: 98 mg/dL (ref 70–99)
Potassium: 3.5 mmol/L (ref 3.5–5.1)
Sodium: 136 mmol/L (ref 135–145)

## 2019-12-29 LAB — HEPARIN LEVEL (UNFRACTIONATED): Heparin Unfractionated: 0.37 IU/mL (ref 0.30–0.70)

## 2019-12-29 MED ORDER — ENOXAPARIN SODIUM 120 MG/0.8ML ~~LOC~~ SOLN
120.0000 mg | SUBCUTANEOUS | Status: DC
  Administered 2019-12-29: 120 mg via SUBCUTANEOUS
  Filled 2019-12-29: qty 0.8

## 2019-12-29 MED ORDER — ENOXAPARIN SODIUM 120 MG/0.8ML ~~LOC~~ SOLN: 120.0000 mg | SUBCUTANEOUS | 1 refills | Status: DC

## 2019-12-29 NOTE — Discharge Summary (Signed)
Physician Discharge Summary  Stacy Burns [id number] DOB: 24-Jul-1946 DOA: 12/26/2019  PCP: Stacy Hibbs, NP  Admit date: 12/26/2019 Discharge date: 12/29/2019  Admitted From: home Disposition:  home  Recommendations for Outpatient Follow-up:  1. Follow up with Dr. Benay Spice in 3 days as scheduled for chemotherapy   Home Health: none Equipment/Devices: none  Discharge Condition: stable CODE STATUS: Full code Diet recommendation: regular   HPI: Per admitting MD, Stacy Burns is a 74 y.o. female with medical history significant of COVID-19 virus infection in January 2021, severe anemia recently found to have metastatic colon cancer presents to the hospital with complaints of right lower extremity swelling and pain over the last several days.  She was recently hospitalized just a week and a half ago with weakness, found to be significantly anemic and had a GI bleed.  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  She also was found to have liver mets.  Underwent liver biopsy on 12/23/2019 which showed adenocarcinoma as well suggesting metastatic disease.  Patient also had a port placed recently.  She was seen today in the cancer center and had right lower extremity swelling, underwent a Doppler and confirmed right lower extremity DVT.  Due to recent GI bleed she was directed to be admitted to the hospital and monitored while on heparin infusion.  Per oncology general surgery will also be consulted to consider resection of the primary tumor.  Currently patient denies any chest pain, does complain of shortness of breath that is somewhat chronic since her Covid in January, no lightheadedness or dizziness.  No abdominal pain, nausea or vomiting.  She denies any bright red blood per rectum however does report her stools have been black since she has been on iron.  Hospital Course / Discharge diagnoses: Acute  DVT-patient was admitted monitored in the hospital while on heparin infusion given history of GI bleed.  She received a unit of packed red blood cells for hemoglobin of 6.8 on admission.  Repeat hemoglobin improved appropriately and has remained stable.  She has no significant clinical bleeding.  General surgery was consulted for potentially tumor resection if there is ongoing bleeding and followed patient while hospitalized.  With her bleeding being stable while on anticoagulation, I have discussed her case with Dr. Benay Spice, and stable to discharge home with close outpatient follow-up in 3 days to initiate chemotherapy.  She was converted to subcutaneous Lovenox and she is to continue that at home.  Patient was given teaching about self injections  Active Problems Metastatic colon cancer-oncology will follow as an outpatient, start chemotherapy in 3 days Acute on chronic blood loss anemia-hemoglobin dropped to 6.8 on 5/15, received a unit of packed red blood cells, improved appropriately to 8.0 and has remained stable without evidence of further bleed.  Continue iron on discharge History of essential hypertension-normotensive here, does not appear to be on medication, monitor Leukocytosis-noted during her prior hospital stay, discussed with Dr. Benay Spice, this is likely tumor burden induced, she also had a mild temp which is altered factorial due to tumor burden as well as acute DVT.  She has no signs or symptoms of bacterial infections.   Discharge Instructions   Allergies as of 12/29/2019      Reactions   Milk-related Compounds Nausea Only, Other (See Comments)   Delayed reaction if too much consumed    Penicillins Anaphylaxis, Other (See Comments)   Both parents had anaphylactic reactions  to this   Pineapple Swelling, Other (See Comments)   Tongue swells and causes acid bumps there, also   Chocolate Other (See Comments)   Nasal congestion   Hydrocodone Nausea And Vomiting   Other  Itching, Rash, Other (See Comments)   "Most all antibiotics and OTC cold meds break me out" Allergic to ALL NUTS, also = Itching   Sulfa Antibiotics Rash, Other (See Comments)   Severe rash      Medication List    TAKE these medications   enoxaparin 120 MG/0.8ML injection Commonly known as: LOVENOX Inject 0.8 mLs (120 mg total) into the skin daily. Please dispense 30 syringes   ferrous sulfate 325 (65 FE) MG tablet Take 1 tablet (325 mg total) by mouth 2 (two) times daily with a meal.      Follow-up Information    Ladell Pier, MD Follow up.   Specialty: Oncology Why: as scheduled for chemotherapy Contact information: [place] [address] [phone]           Consultations:  Oncology  General surgery  Procedures/Studies:  CT ABDOMEN PELVIS WO CONTRAST  Result Date: 12/16/2019 CLINICAL DATA:  74 year old female with anemia. Weight loss. Evaluate for malignancy. EXAM: CT ABDOMEN AND PELVIS WITHOUT CONTRAST TECHNIQUE: Multidetector CT imaging of the abdomen and pelvis was performed following the standard protocol without IV contrast. COMPARISON:  None. FINDINGS: Evaluation of this exam is limited in the absence of intravenous contrast. Lower chest: There is a 5 mm left lower lobe calcified granuloma. The visualized lung bases are otherwise clear. No intra-abdominal free air or free fluid. Hepatobiliary: Cirrhosis. Multiple hepatic hypodense lesions measuring up to approximately 3.5 cm consistent with metastatic disease. No intrahepatic biliary ductal dilatation. The gallbladder is unremarkable. Pancreas: Unremarkable. No pancreatic ductal dilatation or surrounding inflammatory changes. Spleen: Normal in size without focal abnormality. Adrenals/Urinary Tract: The adrenal glands are unremarkable. The kidneys, and visualized ureters appear unremarkable. The urinary bladder is collapsed. Stomach/Bowel: There is an area of thickening of the proximal  colon involving the cecum and ascending colon with probable degree of stricture just above the ileocecal valve most consistent with malignancy. There is stranding and haziness of the adjacent fat with several nodular density adjacent to the cecum consistent with metastatic disease. The largest pericecal nodule or confluent of nodule medial to the cecum measures 20 x 17 mm (49/3). There is a small hiatal hernia. There is no bowel obstruction. The appendix is normal. Vascular/Lymphatic: Advanced aortoiliac atherosclerotic disease. The IVC is unremarkable. No portal venous gas. There is no adenopathy. Reproductive: The uterus and ovaries are grossly unremarkable. Other: None Musculoskeletal: None mild degenerative changes. No acute osseous pathology. IMPRESSION: 1. Focal area of wall thickening and nodularity involving the cecum and ascending colon with probable degree of stricture just above the ileocecal valve most consistent with malignancy. Further evaluation with colonoscopy is recommended. 2. Cirrhosis with innumerable hepatic metastatic disease. 3. No bowel obstruction. Normal appendix. 4. Aortic Atherosclerosis (ICD10-I70.0). Electronically Signed   By: Anner Crete M.D.   On: 12/16/2019 22:23   DG Chest 2 View  Result Date: 12/12/2019 CLINICAL DATA:  History of COVID-19 positivity in January of 2021 with increasing shortness of breath EXAM: CHEST - 2 VIEW COMPARISON:  12/25/2013 FINDINGS: Cardiac shadow is within normal limits. Calcified granuloma is noted in left lung base. No sizable infiltrate or effusion is seen. No bony abnormality is noted. IMPRESSION: No active cardiopulmonary disease. Electronically Signed   By: Elta Guadeloupe  Lukens M.D.   On: 12/12/2019 20:36   US BIOPSY (LIVER)  Result Date: 12/23/2019 INDICATION: 74 year old female with a history of multiple liver masses compatible with metastases. She has been referred for a biopsy EXAM: ULTRASOUND-GUIDED BIOPSY LIVER MASS MEDICATIONS: None.  ANESTHESIA/SEDATION: Moderate (conscious) sedation was employed during this procedure. A total of Versed 1.0 mg and Fentanyl 50 mcg was administered intravenously. Moderate Sedation Time: 10 minutes. The patient's level of consciousness and vital signs were monitored continuously by radiology nursing throughout the procedure under my direct supervision. FLUOROSCOPY TIME:  None COMPLICATIONS: None PROCEDURE: Informed written consent was obtained from the patient after a thorough discussion of the procedural risks, benefits and alternatives. All questions were addressed. Maximal Sterile Barrier Technique was utilized including caps, mask, sterile gowns, sterile gloves, sterile drape, hand hygiene and skin antiseptic. A timeout was performed prior to the initiation of the procedure. Ultrasound survey of the right liver lobe performed with images stored and sent to PACs. The right lower thorax/right upper abdomen was prepped with chlorhexidine in a sterile fashion, and a sterile drape was applied covering the operative field. A sterile gown and sterile gloves were used for the procedure. Local anesthesia was provided with 1% Lidocaine. Once the patient is prepped and draped sterilely and the skin and subcutaneous tissues were generously infiltrated with 1% lidocaine, a small stab incision was made with an 11 blade scalpel. A 17 gauge introducer needle was advanced under ultrasound guidance in an intercostal location into the right liver lobe, targeting hypoechoic mass within the right liver. The stylet was removed, and 4 separate 18 gauge core biopsy were retrieved. Samples were placed into formalin for transportation to the lab. Three separate Gel-Foam pledgets were then infused with a small amount of saline for assistance with hemostasis. The needle was removed, and a final ultrasound image was performed. The patient tolerated the procedure well and remained hemodynamically stable throughout. No complications were  encountered and no significant blood loss was encounter. IMPRESSION: Status post ultrasound-guided biopsy of right liver mass. Tissue specimen sent to pathology for complete histopathologic analysis. Signed, Dulcy Fanny. Dellia Nims, RPVI [facility] Radiology Electronically Signed   By: Corrie Mckusick D.O.   On: 12/23/2019 16:17   DG CHEST PORT 1 VIEW  Result Date: 12/19/2019 CLINICAL DATA:  Postoperative evaluation. Anemia. Prior history of COVID. EXAM: PORTABLE CHEST 1 VIEW COMPARISON:  12/12/2019. FINDINGS: PowerPort catheter with tip over superior vena cava. No pneumothorax. Mild left base subsegmental atelectasis. No pleural effusion. Heart size normal. No acute bony abnormality. IMPRESSION: PowerPort catheter noted with tip over superior vena cava. No pneumothorax. Mild left base subsegmental atelectasis. Electronically Signed   By: Marcello Moores  Register   On: 12/19/2019 14:04   DG Fluoro Guide CV Line-No Report  Result Date: 12/19/2019 Fluoroscopy was utilized by the requesting physician.  No radiographic interpretation.   ECHOCARDIOGRAM COMPLETE  Result Date: 12/16/2019    ECHOCARDIOGRAM REPORT   Patient Name:   DARNELLE DEBONA Date of Exam: 12/16/2019 Medical Rec #:  [id number]    Height:       67.0 in Accession #:    [id number]   Weight:       182.0 lb Date of Birth:  1946/03/14     BSA:          1.943 m Patient Age:    13 years     BP:           136/68 mmHg Patient  Gender: F            HR:           79 bpm. Exam Location:  Inpatient Procedure: 2D Echo, Color Doppler and Cardiac Doppler Indications:    R06.9 DOE  History:        Patient has no prior history of Echocardiogram examinations.                 COVID+ in Jan 2021.  Sonographer:    Raquel Sarna Senior RDCS Referring Phys: [id number] [place]  Sonographer Comments: Suboptimal parasternal window. IMPRESSIONS  1. Left ventricular ejection fraction, by estimation, is 60 to 65%. The left ventricle has normal  function. The left ventricle has no regional wall motion abnormalities. There is mild concentric left ventricular hypertrophy. Left ventricular diastolic parameters are indeterminate.  2. Right ventricular systolic function is normal. The right ventricular size is normal.  3. The mitral valve is normal in structure. Trivial mitral valve regurgitation. No evidence of mitral stenosis.  4. The aortic valve was not well visualized. Aortic valve regurgitation is not visualized. No aortic stenosis is present.  5. The inferior vena cava is dilated in size with >50% respiratory variability, suggesting right atrial pressure of 8 mmHg. Comparison(s): No prior Echocardiogram. Conclusion(s)/Recommendation(s): Normal biventricular function without evidence of hemodynamically significant valvular heart disease. FINDINGS  Left Ventricle: Left ventricular ejection fraction, by estimation, is 60 to 65%. The left ventricle has normal function. The left ventricle has no regional wall motion abnormalities. The left ventricular internal cavity size was normal in size. There is  mild concentric left ventricular hypertrophy. Left ventricular diastolic parameters are indeterminate. Right Ventricle: The right ventricular size is normal. No increase in right ventricular wall thickness. Right ventricular systolic function is normal. Left Atrium: Left atrial size was normal in size. Right Atrium: Right atrial size was normal in size. Pericardium: There is no evidence of pericardial effusion. Mitral Valve: The mitral valve is normal in structure. Moderate mitral annular calcification. Trivial mitral valve regurgitation. No evidence of mitral valve stenosis. Tricuspid Valve: The tricuspid valve is normal in structure. Tricuspid valve regurgitation is trivial. No evidence of tricuspid stenosis. Aortic Valve: The aortic valve was not well visualized. Aortic valve regurgitation is not visualized. No aortic stenosis is present. Pulmonic Valve: The  pulmonic valve was not well visualized. Pulmonic valve regurgitation is not visualized. No evidence of pulmonic stenosis. Aorta: The aortic root was not well visualized and the ascending aorta was not well visualized. Pulmonary Artery: The pulmonary artery is not well seen. Venous: The inferior vena cava is dilated in size with greater than 50% respiratory variability, suggesting right atrial pressure of 8 mmHg. IAS/Shunts: The atrial septum is grossly normal.  LEFT VENTRICLE PLAX 2D LVIDd:         4.37 cm  Diastology LVIDs:         2.83 cm  LV e' lateral:   9.03 cm/s LV PW:         1.32 cm  LV E/e' lateral: 10.5 LV IVS:        1.25 cm  LV e' medial:    6.96 cm/s LVOT diam:     2.10 cm  LV E/e' medial:  13.6 LV SV:         74 LV SV Index:   38 LVOT Area:     3.46 cm  RIGHT VENTRICLE RV S prime:     12.20 cm/s TAPSE (M-mode): 2.0 cm LEFT ATRIUM  Index       RIGHT ATRIUM           Index LA diam:        3.70 cm 1.90 cm/m  RA Area:     11.70 cm LA Vol (A2C):   61.9 ml 31.86 ml/m RA Volume:   24.00 ml  12.35 ml/m LA Vol (A4C):   51.2 ml 26.35 ml/m LA Biplane Vol: 55.9 ml 28.77 ml/m  AORTIC VALVE LVOT Vmax:   112.00 cm/s LVOT Vmean:  74.300 cm/s LVOT VTI:    0.214 m  AORTA Ao Root diam: 3.60 cm Ao Asc diam:  3.30 cm MITRAL VALVE MV Area (PHT): 2.76 cm    SHUNTS MV Decel Time: 275 msec    Systemic VTI:  0.21 m MV E velocity: 94.60 cm/s  Systemic Diam: 2.10 cm MV A velocity: 97.50 cm/s MV E/A ratio:  0.97 Buford Dresser MD Electronically signed by Buford Dresser MD Signature Date/Time: 12/16/2019/2:19:48 PM    Final    VAS [place] LOWER EXTREMITY VENOUS (DVT)  Result Date: 12/28/2019  Lower Venous DVTStudy Indications: Edema, and patient reports sensation of "Charlie horse" for about one week. Recent 6 day hospital stay.  Comparison Study: No prior study Performing Technologist: Maudry Mayhew MHA, RDMS, RVT, RDCS  Examination Guidelines: A complete evaluation includes B-mode imaging,  spectral Doppler, color Doppler, and power Doppler as needed of all accessible portions of each vessel. Bilateral testing is considered an integral part of a complete examination. Limited examinations for reoccurring indications may be performed as noted. The reflux portion of the exam is performed with the patient in reverse Trendelenburg.  +---------+---------------+---------+-----------+----------+--------------+ RIGHT    CompressibilityPhasicitySpontaneityPropertiesThrombus Aging +---------+---------------+---------+-----------+----------+--------------+ CFV      Full           Yes      Yes                                 +---------+---------------+---------+-----------+----------+--------------+ SFJ      Full                                                        +---------+---------------+---------+-----------+----------+--------------+ FV Prox  Full                                                        +---------+---------------+---------+-----------+----------+--------------+ FV Mid   Full                                                        +---------+---------------+---------+-----------+----------+--------------+ FV DistalFull                                                        +---------+---------------+---------+-----------+----------+--------------+ PFV      Full                                                        +---------+---------------+---------+-----------+----------+--------------+  POP      Full           Yes      Yes                                 +---------+---------------+---------+-----------+----------+--------------+ PTV      Full                                                        +---------+---------------+---------+-----------+----------+--------------+ PERO     Full                                                        +---------+---------------+---------+-----------+----------+--------------+ Soleal    None                    No                   Acute          +---------+---------------+---------+-----------+----------+--------------+ Gastroc  None                    No                   Acute          +---------+---------------+---------+-----------+----------+--------------+   +----+---------------+---------+-----------+----------+--------------+ LEFTCompressibilityPhasicitySpontaneityPropertiesThrombus Aging +----+---------------+---------+-----------+----------+--------------+ CFV Full           Yes      Yes                                 +----+---------------+---------+-----------+----------+--------------+     Summary: RIGHT: - Findings consistent with acute deep vein thrombosis involving the right soleal veins, and right gastrocnemius veins. - No cystic structure found in the popliteal fossa.  LEFT: - No evidence of common femoral vein obstruction.  *See table(s) above for measurements and observations. Electronically signed by Ruta Hinds MD on 12/28/2019 at 8:58:09 AM.    Final       Subjective: - no chest pain, shortness of breath, no abdominal pain, nausea or vomiting.   Discharge Exam: BP (!) 140/58 (BP Location: Right Arm)   Pulse 81   Temp 97.9 F (36.6 C) (Oral)   Resp 18   Ht 5\' 7"  (1.702 m)   Wt 80.7 kg   SpO2 96%   BMI 27.86 kg/m   General: Pt is alert, awake, not in acute distress Cardiovascular: RRR, S1/S2 +, no rubs, no gallops Respiratory: CTA bilaterally, no wheezing, no rhonchi Abdominal: Soft, NT, ND, bowel sounds + Extremities: no edema, no cyanosis    The results of significant diagnostics from this hospitalization (including imaging, microbiology, ancillary and laboratory) are listed below for reference.     Microbiology: No results found for this or any previous visit (from the past 240 hour(s)).   Labs: Basic Metabolic Panel: Recent Labs  Lab 12/26/19 1640 12/27/19 0216 12/29/19 0455  NA 133* 135 136  K 3.3* 3.7  3.5  CL 101 107 106  CO2 23 19* 22  GLUCOSE 105* 122* 98  BUN 19 17 15   CREATININE 0.95 0.87 0.90  CALCIUM 7.9* 7.9* 8.3*   Liver Function Tests: Recent Labs  Lab 12/27/19 0216  AST 44*  ALT 22  ALKPHOS 193*  BILITOT 0.6  PROT 5.7*  ALBUMIN 2.3*   CBC: Recent Labs  Lab 12/26/19 0952 12/27/19 0216 12/27/19 1125 12/28/19 0452 12/29/19 0455  WBC 13.5* 13.5* 15.1* 15.9* 18.9*  NEUTROABS 10.2*  --   --   --   --   HGB 7.3* 6.8* 8.0* 8.5* 8.0*  HCT 25.3* 23.9* 27.1* 28.9* 26.7*  MCV 71.9* 73.5* 75.7* 75.9* 75.2*  PLT 552* 530* 519* 593* 613*   CBG: No results for input(s): GLUCAP in the last 168 hours. Hgb A1c No results for input(s): HGBA1C in the last 72 hours. Lipid Profile No results for input(s): CHOL, HDL, LDLCALC, TRIG, CHOLHDL, LDLDIRECT in the last 72 hours. Thyroid function studies No results for input(s): TSH, T4TOTAL, T3FREE, THYROIDAB in the last 72 hours.  Invalid input(s): FREET3 Urinalysis No results found for: COLORURINE, APPEARANCEUR, LABSPEC, PHURINE, GLUCOSEU, HGBUR, BILIRUBINUR, KETONESUR, PROTEINUR, UROBILINOGEN, NITRITE, LEUKOCYTESUR  FURTHER DISCHARGE INSTRUCTIONS:   Get Medicines reviewed and adjusted: Please take all your medications with you for your next visit with your Primary MD   Laboratory/radiological data: Please request your Primary MD to go over all hospital tests and procedure/radiological results at the follow up, please ask your Primary MD to get all Hospital records sent to his/her office.   In some cases, they will be blood work, cultures and biopsy results pending at the time of your discharge. Please request that your primary care M.D. goes through all the records of your hospital data and follows up on these results.   Also Note the following: If you experience worsening of your admission symptoms, develop shortness of breath, life threatening emergency, suicidal or homicidal thoughts you must seek medical attention  immediately by calling 911 or calling your MD immediately  if symptoms less severe.   You must read complete instructions/literature along with all the possible adverse reactions/side effects for all the Medicines you take and that have been prescribed to you. Take any new Medicines after you have completely understood and accpet all the possible adverse reactions/side effects.    Do not drive when taking Pain medications or sleeping medications (Benzodaizepines)   Do not take more than prescribed Pain, Sleep and Anxiety Medications. It is not advisable to combine anxiety,sleep and pain medications without talking with your primary care practitioner   Special Instructions: If you have smoked or chewed Tobacco  in the last 2 yrs please stop smoking, stop any regular Alcohol  and or any Recreational drug use.   Wear Seat belts while driving.   Please note: You were cared for by a hospitalist during your hospital stay. Once you are discharged, your primary care physician will handle any further medical issues. Please note that NO REFILLS for any discharge medications will be authorized once you are discharged, as it is imperative that you return to your primary care physician (or establish a relationship with a primary care physician if you do not have one) for your post hospital discharge needs so that they can reassess your need for medications and monitor your lab values.  Time coordinating discharge: 35 minutes  SIGNED:  Marzetta Board, MD, PhD 12/29/2019, 1:37 PM

## 2019-12-29 NOTE — Progress Notes (Signed)
Went over discharge instructions w/ patient. Patient verbalized understanding. Son will be here at 1545 to pick her up.

## 2019-12-29 NOTE — Progress Notes (Signed)
[facility] Progress Note     Subjective: Patient denies any further bleeding, is having dark stools but is also on iron supplements. She denies abdominal pain this AM. Tolerating diet. Reports R calf still feels like theres a dull ache but no sharp pains any longer. Denies chest pain or SOB. She has already spoken with Dr. Benay Spice this AM and decided to go forward with chemotherapy and does not wish to have surgery at this time. She is hopeful to be able to go home today.   Objective: Vital signs in last 24 hours: Temp:  [97.9 F (36.6 C)-100.7 F (38.2 C)] 97.9 F (36.6 C) (05/17 0517) Pulse Rate:  [81-92] 81 (05/17 0517) Resp:  [17-18] 18 (05/17 0517) BP: (140-166)/(58-67) 140/58 (05/17 0517) SpO2:  [96 %-99 %] 96 % (05/17 0517) Last BM Date: 12/28/19  Intake/Output from previous day: 05/16 0701 - 05/17 0700 In: 1408 [P.O.:840; I.V.:568] Out: -  Intake/Output this shift: No intake/output data recorded.  PE: General: pleasant, WD, WN white female who is laying in bed in NAD HEENT:  Sclera are noninjected.  PERRL.  Ears and nose without any masses or lesions.  Mouth is pink and moist Heart: regular, rate, and rhythm.  Normal s1,s2. No obvious murmurs, gallops, or rubs noted.  Palpable radial and pedal pulses bilaterally. No calf ttp. Lungs: CTAB, no wheezes, rhonchi, or rales noted.  Respiratory effort nonlabored Abd: soft, NT, ND, +BS, no masses, hernias, or organomegaly MS: all 4 extremities are symmetrical with no cyanosis, clubbing, or edema. Skin: warm and dry with no masses, lesions, or rashes Neuro: Cranial nerves 2-12 grossly intact, sensation is grossly intact throughout Psych: A&Ox3 with an appropriate affect.   Lab Results:  Recent Labs    12/28/19 0452 12/29/19 0455  WBC 15.9* 18.9*  HGB 8.5* 8.0*  HCT 28.9* 26.7*  PLT 593* 613*   BMET Recent Labs    12/27/19 0216 12/29/19 0455  NA 135 136  K 3.7 3.5  CL 107 106  CO2 19* 22  GLUCOSE  122* 98  BUN 17 15  CREATININE 0.87 0.90  CALCIUM 7.9* 8.3*   PT/INR Recent Labs    12/26/19 1725  LABPROT 13.6  INR 1.1   CMP     Component Value Date/Time   NA 136 12/29/2019 0455   K 3.5 12/29/2019 0455   CL 106 12/29/2019 0455   CO2 22 12/29/2019 0455   GLUCOSE 98 12/29/2019 0455   BUN 15 12/29/2019 0455   CREATININE 0.90 12/29/2019 0455   CALCIUM 8.3 (L) 12/29/2019 0455   PROT 5.7 (L) 12/27/2019 0216   ALBUMIN 2.3 (L) 12/27/2019 0216   AST 44 (H) 12/27/2019 0216   ALT 22 12/27/2019 0216   ALKPHOS 193 (H) 12/27/2019 0216   BILITOT 0.6 12/27/2019 0216   GFRNONAA >60 12/29/2019 0455   GFRAA >60 12/29/2019 0455   Lipase     Component Value Date/Time   LIPASE 40 12/15/2019 1804       Studies/Results: No results found.  Anti-infectives: Anti-infectives (From admission, onward)   None       Assessment/Plan Metastatic colon cancer Chronic GI bleeding with chronic blood loss anemia RLE DVT on anticoagulation - patient seen by Oncology (Dr. Benay Spice) already this AM and reports that her plan currently is to go ahead and proceed with chemotherapy - No further bloody stools - hgb 8.0, VSS - if patient has continued issues with GI bleeding, can consider elective surgery, but would need  to keep in mind that this would mean stopping chemo around time of surgery and for a few weeks after - since patient electing not to have surgery at this time, we will sign off but are available as needed for questions or concerns   LOS: 2 days    Norm Parcel , [facility] 12/29/2019, 8:43 AM Please see Amion for pager number during day hours 7:00am-4:30pm

## 2019-12-29 NOTE — Progress Notes (Signed)
Patient education for home administration of Lovenox SQ performed by this nurse. Educated the patient about safety, disposal, safety trigger and administration sites and to rotate them, hand hygiene and to cleanse skin with alcohol prep pad before administration. Patient has no further questions at this time. Call bell within reach, bed alarm on.

## 2019-12-29 NOTE — TOC Benefit Eligibility Note (Signed)
Transition of Care [facility]) Benefit Eligibility Note    Patient Details  Name: Stacy Burns MRN: [id number] Date of Birth: 1946/05/20   Medication/Dose: Lovenox 40 mg for 30 day supply  Covered?: Yes  Tier: 2 Drug  Prescription Coverage Preferred Pharmacy: local  Spoke with Person/Company/Phone Number:: James/ [employer] [phone]  Co-Pay: Lovenox and Enoxaparin copay is $10.00  Prior Approval: No  Deductible: Met       Kerin Salen Phone Number: 12/29/2019, 2:17 PM

## 2019-12-29 NOTE — Progress Notes (Addendum)
HEMATOLOGY-ONCOLOGY PROGRESS NOTE  SUBJECTIVE: Feels better this morning.  Lower extremity edema has significantly improved.  She reports ongoing black stools but no obvious bleeding.  Thinks black stools are related to her oral iron.  PHYSICAL EXAMINATION:  Vitals:   12/28/19 2105 12/29/19 0517  BP: (!) 151/64 (!) 140/58  Pulse: 92 81  Resp: 17 18  Temp: (!) 100.7 F (38.2 C) 97.9 F (36.6 C)  SpO2: 98% 96%   Filed Weights   12/26/19 1558  Weight: 80.7 kg    Intake/Output from previous day: 05/16 0701 - 05/17 0700 In: 1408 [P.O.:840; I.V.:568] Out: -   GENERAL:alert, no distress and comfortable OROPHARYNX:no exudate, no erythema and lips, buccal mucosa, and tongue normal  LUNGS: clear to auscultation and percussion with normal breathing effort HEART: regular rate & rhythm and no murmurs and no lower extremity edema ABDOMEN:abdomen soft, non-tender and normal bowel sounds NEURO: alert & oriented x 3 with fluent speech, no focal motor/sensory deficits Port-A-Cath without erythema, Steri-Strips intact  LABORATORY DATA:  I have reviewed the data as listed CMP Latest Ref Rng & Units 12/29/2019 12/27/2019 12/26/2019  Glucose 70 - 99 mg/dL 98 122(H) 105(H)  BUN 8 - 23 mg/dL _0 Creatinine 0.44 - 1.00 mg/dL 0.90 0.87 0.95  Sodium 135 - 145 mmol/L 136 135 133(L)  Potassium 3.5 - 5.1 mmol/L 3.5 3.7 3.3(L)  Chloride 98 - 111 mmol/L 106 107 101  CO2 22 - 32 mmol/L 22 19(L) 23  Calcium 8.9 - 10.3 mg/dL 8.3(L) 7.9(L) 7.9(L)  Total Protein 6.5 - 8.1 g/dL - 5.7(L) -  Total Bilirubin 0.3 - 1.2 mg/dL - 0.6 -  Alkaline Phos 38 - 126 U/L - 193(H) -  AST 15 - 41 U/L - 44(H) -  ALT 0 - 44 U/L - 22 -    Lab Results  Component Value Date   WBC 18.9 (H) 12/29/2019   HGB 8.0 (L) 12/29/2019   HCT 26.7 (L) 12/29/2019   MCV 75.2 (L) 12/29/2019   PLT 613 (H) 12/29/2019   NEUTROABS 10.2 (H) 12/26/2019    CT ABDOMEN PELVIS WO CONTRAST  Result Date: 12/16/2019 CLINICAL DATA:   74 year old female with anemia. Weight loss. Evaluate for malignancy. EXAM: CT ABDOMEN AND PELVIS WITHOUT CONTRAST TECHNIQUE: Multidetector CT imaging of the abdomen and pelvis was performed following the standard protocol without IV contrast. COMPARISON:  None. FINDINGS: Evaluation of this exam is limited in the absence of intravenous contrast. Lower chest: There is a 5 mm left lower lobe calcified granuloma. The visualized lung bases are otherwise clear. No intra-abdominal free air or free fluid. Hepatobiliary: Cirrhosis. Multiple hepatic hypodense lesions measuring up to approximately 3.5 cm consistent with metastatic disease. No intrahepatic biliary ductal dilatation. The gallbladder is unremarkable. Pancreas: Unremarkable. No pancreatic ductal dilatation or surrounding inflammatory changes. Spleen: Normal in size without focal abnormality. Adrenals/Urinary Tract: The adrenal glands are unremarkable. The kidneys, and visualized ureters appear unremarkable. The urinary bladder is collapsed. Stomach/Bowel: There is an area of thickening of the proximal colon involving the cecum and ascending colon with probable degree of stricture just above the ileocecal valve most consistent with malignancy. There is stranding and haziness of the adjacent fat with several nodular density adjacent to the cecum consistent with metastatic disease. The largest pericecal nodule or confluent of nodule medial to the cecum measures 20 x 17 mm (49/3). There is a small hiatal hernia. There is no bowel obstruction. The appendix is normal. Vascular/Lymphatic: Advanced aortoiliac atherosclerotic  disease. The IVC is unremarkable. No portal venous gas. There is no adenopathy. Reproductive: The uterus and ovaries are grossly unremarkable. Other: None Musculoskeletal: None mild degenerative changes. No acute osseous pathology. IMPRESSION: 1. Focal area of wall thickening and nodularity involving the cecum and ascending colon with probable degree  of stricture just above the ileocecal valve most consistent with malignancy. Further evaluation with colonoscopy is recommended. 2. Cirrhosis with innumerable hepatic metastatic disease. 3. No bowel obstruction. Normal appendix. 4. Aortic Atherosclerosis (ICD10-I70.0). Electronically Signed   By: Anner Crete M.D.   On: 12/16/2019 22:23   DG Chest 2 View  Result Date: 12/12/2019 CLINICAL DATA:  History of COVID-19 positivity in January of 2021 with increasing shortness of breath EXAM: CHEST - 2 VIEW COMPARISON:  12/25/2013 FINDINGS: Cardiac shadow is within normal limits. Calcified granuloma is noted in left lung base. No sizable infiltrate or effusion is seen. No bony abnormality is noted. IMPRESSION: No active cardiopulmonary disease. Electronically Signed   By: Inez Catalina M.D.   On: 12/12/2019 20:36   US BIOPSY (LIVER)  Result Date: 12/23/2019 INDICATION: 74 year old female with a history of multiple liver masses compatible with metastases. She has been referred for a biopsy EXAM: ULTRASOUND-GUIDED BIOPSY LIVER MASS MEDICATIONS: None. ANESTHESIA/SEDATION: Moderate (conscious) sedation was employed during this procedure. A total of Versed 1.0 mg and Fentanyl 50 mcg was administered intravenously. Moderate Sedation Time: 10 minutes. The patient's level of consciousness and vital signs were monitored continuously by radiology nursing throughout the procedure under my direct supervision. FLUOROSCOPY TIME:  None COMPLICATIONS: None PROCEDURE: Informed written consent was obtained from the patient after a thorough discussion of the procedural risks, benefits and alternatives. All questions were addressed. Maximal Sterile Barrier Technique was utilized including caps, mask, sterile gowns, sterile gloves, sterile drape, hand hygiene and skin antiseptic. A timeout was performed prior to the initiation of the procedure. Ultrasound survey of the right liver lobe performed with images stored and sent to PACs.  The right lower thorax/right upper abdomen was prepped with chlorhexidine in a sterile fashion, and a sterile drape was applied covering the operative field. A sterile gown and sterile gloves were used for the procedure. Local anesthesia was provided with 1% Lidocaine. Once the patient is prepped and draped sterilely and the skin and subcutaneous tissues were generously infiltrated with 1% lidocaine, a small stab incision was made with an 11 blade scalpel. A 17 gauge introducer needle was advanced under ultrasound guidance in an intercostal location into the right liver lobe, targeting hypoechoic mass within the right liver. The stylet was removed, and 4 separate 18 gauge core biopsy were retrieved. Samples were placed into formalin for transportation to the lab. Three separate Gel-Foam pledgets were then infused with a small amount of saline for assistance with hemostasis. The needle was removed, and a final ultrasound image was performed. The patient tolerated the procedure well and remained hemodynamically stable throughout. No complications were encountered and no significant blood loss was encounter. IMPRESSION: Status post ultrasound-guided biopsy of right liver mass. Tissue specimen sent to pathology for complete histopathologic analysis. Signed, Dulcy Fanny. Dellia Nims, RPVI [facility] Radiology Electronically Signed   By: Corrie Mckusick D.O.   On: 12/23/2019 16:17   DG CHEST PORT 1 VIEW  Result Date: 12/19/2019 CLINICAL DATA:  Postoperative evaluation. Anemia. Prior history of COVID. EXAM: PORTABLE CHEST 1 VIEW COMPARISON:  12/12/2019. FINDINGS: PowerPort catheter with tip over superior vena cava. No pneumothorax. Mild left base  subsegmental atelectasis. No pleural effusion. Heart size normal. No acute bony abnormality. IMPRESSION: PowerPort catheter noted with tip over superior vena cava. No pneumothorax. Mild left base subsegmental atelectasis.  Electronically Signed   By: Marcello Moores  Register   On: 12/19/2019 14:04   DG Fluoro Guide CV Line-No Report  Result Date: 12/19/2019 Fluoroscopy was utilized by the requesting physician.  No radiographic interpretation.   ECHOCARDIOGRAM COMPLETE  Result Date: 12/16/2019    ECHOCARDIOGRAM REPORT   Patient Name:   Stacy Burns Date of Exam: 12/16/2019 Medical Rec #:  [id number]    Height:       67.0 in Accession #:    [id number]   Weight:       182.0 lb Date of Birth:  Nov 03, 1945     BSA:          1.943 m Patient Age:    6 years     BP:           136/68 mmHg Patient Gender: F            HR:           79 bpm. Exam Location:  Inpatient Procedure: 2D Echo, Color Doppler and Cardiac Doppler Indications:    R06.9 DOE  History:        Patient has no prior history of Echocardiogram examinations.                 COVID+ in Jan 2021.  Sonographer:    Raquel Sarna Senior RDCS Referring Phys: [id number] [place]  Sonographer Comments: Suboptimal parasternal window. IMPRESSIONS  1. Left ventricular ejection fraction, by estimation, is 60 to 65%. The left ventricle has normal function. The left ventricle has no regional wall motion abnormalities. There is mild concentric left ventricular hypertrophy. Left ventricular diastolic parameters are indeterminate.  2. Right ventricular systolic function is normal. The right ventricular size is normal.  3. The mitral valve is normal in structure. Trivial mitral valve regurgitation. No evidence of mitral stenosis.  4. The aortic valve was not well visualized. Aortic valve regurgitation is not visualized. No aortic stenosis is present.  5. The inferior vena cava is dilated in size with >50% respiratory variability, suggesting right atrial pressure of 8 mmHg. Comparison(s): No prior Echocardiogram. Conclusion(s)/Recommendation(s): Normal biventricular function without evidence of hemodynamically significant valvular heart disease. FINDINGS  Left Ventricle: Left ventricular ejection fraction, by  estimation, is 60 to 65%. The left ventricle has normal function. The left ventricle has no regional wall motion abnormalities. The left ventricular internal cavity size was normal in size. There is  mild concentric left ventricular hypertrophy. Left ventricular diastolic parameters are indeterminate. Right Ventricle: The right ventricular size is normal. No increase in right ventricular wall thickness. Right ventricular systolic function is normal. Left Atrium: Left atrial size was normal in size. Right Atrium: Right atrial size was normal in size. Pericardium: There is no evidence of pericardial effusion. Mitral Valve: The mitral valve is normal in structure. Moderate mitral annular calcification. Trivial mitral valve regurgitation. No evidence of mitral valve stenosis. Tricuspid Valve: The tricuspid valve is normal in structure. Tricuspid valve regurgitation is trivial. No evidence of tricuspid stenosis. Aortic Valve: The aortic valve was not well visualized. Aortic valve regurgitation is not visualized. No aortic stenosis is present. Pulmonic Valve: The pulmonic valve was not well visualized. Pulmonic valve regurgitation is not visualized. No evidence of pulmonic stenosis. Aorta: The aortic root was not well visualized and the ascending aorta was not  well visualized. Pulmonary Artery: The pulmonary artery is not well seen. Venous: The inferior vena cava is dilated in size with greater than 50% respiratory variability, suggesting right atrial pressure of 8 mmHg. IAS/Shunts: The atrial septum is grossly normal.  LEFT VENTRICLE PLAX 2D LVIDd:         4.37 cm  Diastology LVIDs:         2.83 cm  LV e' lateral:   9.03 cm/s LV PW:         1.32 cm  LV E/e' lateral: 10.5 LV IVS:        1.25 cm  LV e' medial:    6.96 cm/s LVOT diam:     2.10 cm  LV E/e' medial:  13.6 LV SV:         74 LV SV Index:   38 LVOT Area:     3.46 cm  RIGHT VENTRICLE RV S prime:     12.20 cm/s TAPSE (M-mode): 2.0 cm LEFT ATRIUM             Index        RIGHT ATRIUM           Index LA diam:        3.70 cm 1.90 cm/m  RA Area:     11.70 cm LA Vol (A2C):   61.9 ml 31.86 ml/m RA Volume:   24.00 ml  12.35 ml/m LA Vol (A4C):   51.2 ml 26.35 ml/m LA Biplane Vol: 55.9 ml 28.77 ml/m  AORTIC VALVE LVOT Vmax:   112.00 cm/s LVOT Vmean:  74.300 cm/s LVOT VTI:    0.214 m  AORTA Ao Root diam: 3.60 cm Ao Asc diam:  3.30 cm MITRAL VALVE MV Area (PHT): 2.76 cm    SHUNTS MV Decel Time: 275 msec    Systemic VTI:  0.21 m MV E velocity: 94.60 cm/s  Systemic Diam: 2.10 cm MV A velocity: 97.50 cm/s MV E/A ratio:  0.97 Buford Dresser MD Electronically signed by Buford Dresser MD Signature Date/Time: 12/16/2019/2:19:48 PM    Final    VAS [place] LOWER EXTREMITY VENOUS (DVT)  Result Date: 12/28/2019  Lower Venous DVTStudy Indications: Edema, and patient reports sensation of "Charlie horse" for about one week. Recent 6 day hospital stay.  Comparison Study: No prior study Performing Technologist: Maudry Mayhew MHA, RDMS, RVT, RDCS  Examination Guidelines: A complete evaluation includes B-mode imaging, spectral Doppler, color Doppler, and power Doppler as needed of all accessible portions of each vessel. Bilateral testing is considered an integral part of a complete examination. Limited examinations for reoccurring indications may be performed as noted. The reflux portion of the exam is performed with the patient in reverse Trendelenburg.  +---------+---------------+---------+-----------+----------+--------------+ RIGHT    CompressibilityPhasicitySpontaneityPropertiesThrombus Aging +---------+---------------+---------+-----------+----------+--------------+ CFV      Full           Yes      Yes                                 +---------+---------------+---------+-----------+----------+--------------+ SFJ      Full                                                        +---------+---------------+---------+-----------+----------+--------------+ FV  Prox  Full                                                        +---------+---------------+---------+-----------+----------+--------------+  FV Mid   Full                                                        +---------+---------------+---------+-----------+----------+--------------+ FV DistalFull                                                        +---------+---------------+---------+-----------+----------+--------------+ PFV      Full                                                        +---------+---------------+---------+-----------+----------+--------------+ POP      Full           Yes      Yes                                 +---------+---------------+---------+-----------+----------+--------------+ PTV      Full                                                        +---------+---------------+---------+-----------+----------+--------------+ PERO     Full                                                        +---------+---------------+---------+-----------+----------+--------------+ Soleal   None                    No                   Acute          +---------+---------------+---------+-----------+----------+--------------+ Gastroc  None                    No                   Acute          +---------+---------------+---------+-----------+----------+--------------+   +----+---------------+---------+-----------+----------+--------------+ LEFTCompressibilityPhasicitySpontaneityPropertiesThrombus Aging +----+---------------+---------+-----------+----------+--------------+ CFV Full           Yes      Yes                                 +----+---------------+---------+-----------+----------+--------------+     Summary: RIGHT: - Findings consistent with acute deep vein thrombosis involving the right soleal veins, and right gastrocnemius veins. - No cystic structure found in the popliteal fossa.  LEFT: - No evidence of common  femoral vein obstruction.  *See table(s) above for measurements and observations. Electronically signed by Ruta Hinds MD on 12/28/2019 at 8:58:09 AM.    Final     ASSESSMENT AND PLAN:  1. 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  Cirrhosis.  Innumerable liver lesions.  Colonoscopy 12/18/2019-ulcerated partially obstructing large mass in the mid ascending colon.  Biopsy invasive adenocarcinoma; preserved expression major MMR proteins  Upper endoscopy 12/18/2019-normal esophagus, stomach, examined duodenum  CEA 12/19/2019-8,923  Biopsy liver lesion 12/23/2019-adenocarcinoma 2. Anemia secondary to GI blood loss 3. Cirrhosis 4. 12/26/2019-hospital admission for right lower extremity DVT and GI bleed, treated with heparin anticoagulation beginning 12/26/2019, converted to Lovenox at discharge 12/29/2019 5. Low-grade fever-likely "tumor "fever  Stacy Burns appears stable.  Her lower extremity edema has improved significantly.  She has no active bleeding and her hemoglobin remains stable.  Recommendations: 1.  Begin Lovenox 1.5 mg/kg daily.  Recommend that she continue this medication upon discharge. 2.  Okay to discharge patient home from our perspective.  We are in the process of arranging outpatient follow-up this Thursday for repeat labs, visit, and for cycle of FOLFOX.   LOS: 2 days   Mikey Bussing, DNP, AGPCNP-BC, AOCNP 12/29/19 Stacy Burns was interviewed and examined.  The right leg edema has improved.  No gross bleeding.  The hemoglobin has stabilized after she received a red cell transfusion on 12/27/2019.  The plan is to begin Lovenox anticoagulation.  She will be referred for surgery if she develops increased bleeding while on Lovenox.  The plan is to begin FOLFOX chemotherapy this week.  We reviewed potential toxicities associated with the FOLFOX regimen including the chance for  nausea/vomiting, mucositis, diarrhea, and hematologic toxicity.  We discussed the allergic reaction and various types of neuropathy associated with oxaliplatin.  We discussed the sun sensitivity, hyperpigmentation, and hand/foot syndrome associated with 5-fluorouracil.  She agrees to proceed.  She will attend chemotherapy teaching class prior to chemotherapy later this week.

## 2019-12-29 NOTE — TOC Benefit Eligibility Note (Signed)
Transition of Care [facility]) Benefit Eligibility Note    Patient Details  Name: Stacy Burns MRN: [id number] Date of Birth: 01/22/1946   Medication/Dose: Lovenox 40 mg for 30 day supply  Covered?: Yes  Tier: 2 Drug  Prescription Coverage Preferred Pharmacy: local  Spoke with Person/Company/Phone Number:: James/ [employer] [phone]  Co-Pay: Lovenox and Enoxaparin copay is $10.00  Prior Approval: No  Deductible: Met       Kerin Salen Phone Number: 12/29/2019, 2:18 PM

## 2019-12-29 NOTE — Telephone Encounter (Signed)
Foundation one requested per NP request.

## 2019-12-29 NOTE — Progress Notes (Signed)
ANTICOAGULATION CONSULT NOTE  Pharmacy Consult for Lovenox Indication: acute DVT  Allergies  Allergen Reactions  . Milk-Related Compounds Nausea Only and Other (See Comments)    Delayed reaction if too much consumed   . Penicillins Anaphylaxis and Other (See Comments)    Both parents had anaphylactic reactions to this  . Pineapple Swelling and Other (See Comments)    Tongue swells and causes acid bumps there, also  . Chocolate Other (See Comments)    Nasal congestion  . Hydrocodone Nausea And Vomiting  . Other Itching, Rash and Other (See Comments)    "Most all antibiotics and OTC cold meds break me out" Allergic to ALL NUTS, also = Itching  . Sulfa Antibiotics Rash and Other (See Comments)    Severe rash   Patient Measurements: Height: 5\' 7"  (170.2 cm) Weight: 80.7 kg (177 lb 14.6 oz) IBW/kg (Calculated) : 61.6 Heparin Dosing Weight: 78 kg  Vital Signs: Temp: 97.9 F (36.6 C) (05/17 0517) Temp Source: Oral (05/17 0517) BP: 140/58 (05/17 0517) Pulse Rate: 81 (05/17 0517)  Labs: Recent Labs    12/26/19 0952 12/26/19 1640 12/26/19 1725 12/27/19 0216 12/27/19 0216 12/27/19 1125 12/27/19 2015 12/28/19 0452 12/28/19 1245 12/29/19 0455  HGB  --   --   --  6.8*   < > 8.0*  --  8.5*  --  8.0*  HCT   < >  --   --  23.9*   < > 27.1*  --  28.9*  --  26.7*  PLT  --   --   --  530*   < > 519*  --  593*  --  613*  APTT  --   --  28  --   --   --   --   --   --   --   LABPROT  --   --  13.6  --   --   --   --   --   --   --   INR  --   --  1.1  --   --   --   --   --   --   --   HEPARINUNFRC  --   --   --  0.14*   < > 0.22*   < > 0.40 0.50 0.37  CREATININE  --  0.95  --  0.87  --   --   --   --   --  0.90   < > = values in this interval not displayed.   Estimated Creatinine Clearance: 59.9 mL/min (by C-G formula based on SCr of 0.9 mg/dL).  Assessment: Patient is a 74 y.o Fwith metastatic colon cancer recently hospitalized with symptomatic anemia and partially  obstructing mass in ascending colon, presented to [facility] on 12/26/19 from the cancer center for treatment of RLE DVT.  She's currently on heparin drip for acute VTE.    Pharmacy now consulted to change IV heparin to Lovenox 1.5mg /kg q24h.  Today, 12/29/2019: - Heparin level therapeutic at 0.37 on heparin infusion at 1600 units/hr - CBC: hgb low but stable (s/p 1 unit PRBC on 5/15), plts 613 - No active bleeding noted   Goal of Therapy:  Heparin level 1.0-2.0 4h after enoxaparin dose Monitor platelets by anticoagulation protocol: Yes   Plan:  D/C IV heparin & heparin labs Start Lovenox 120mg  SQ q24h Daily CBC  Monitor closely for s/sx bleeding  Netta Cedars PharmD 12/29/2019,8:07 AM

## 2019-12-29 NOTE — Progress Notes (Signed)
START ON PATHWAY REGIMEN - Colorectal     A cycle is every 14 days:     Oxaliplatin      Leucovorin      Fluorouracil      Fluorouracil   **Always confirm dose/schedule in your pharmacy ordering system**  Patient Characteristics: Distant Metastases, Nonsurgical Candidate, KRAS/NRAS Mutation Positive/Unknown (BRAF V600 Wild-Type/Unknown), Standard Cytotoxic Therapy, First Line Standard Cytotoxic Therapy, Bevacizumab Ineligible, PS = 0,1 Tumor Location: Colon Therapeutic Status: Distant Metastases Microsatellite/Mismatch Repair Status: Unknown BRAF Mutation Status: Awaiting Test Results KRAS/NRAS Mutation Status: Awaiting Test Results Standard Cytotoxic Line of Therapy: First Line Standard Cytotoxic Therapy ECOG Performance Status: 1 Bevacizumab Eligibility: Ineligible Intent of Therapy: Non-Curative / Palliative Intent, Discussed with Patient

## 2019-12-29 NOTE — Plan of Care (Signed)

## 2019-12-30 ENCOUNTER — Other Ambulatory Visit: Payer: Self-pay | Admitting: Emergency Medicine

## 2019-12-30 ENCOUNTER — Telehealth: Payer: Self-pay | Admitting: Oncology

## 2019-12-30 MED ORDER — LIDOCAINE-PRILOCAINE 2.5-2.5 % EX CREA
1.0000 | TOPICAL_CREAM | CUTANEOUS | 0 refills | Status: DC | PRN
Start: 2019-12-30 — End: 2021-05-05

## 2019-12-30 MED ORDER — PROCHLORPERAZINE MALEATE 10 MG PO TABS
10.0000 mg | ORAL_TABLET | Freq: Four times a day (QID) | ORAL | 0 refills | Status: DC | PRN
Start: 2019-12-30 — End: 2020-01-05

## 2019-12-30 MED ORDER — ONDANSETRON HCL 8 MG PO TABS
8.0000 mg | ORAL_TABLET | Freq: Three times a day (TID) | ORAL | 0 refills | Status: DC | PRN
Start: 2019-12-30 — End: 2023-01-19

## 2019-12-30 NOTE — Telephone Encounter (Signed)
Called pt per 5/17 sch message- no answer. Sent message to RN to let them know

## 2019-12-30 NOTE — Progress Notes (Signed)
Spoke with patient regarding chemotherapy education class.  I scheduled her for tomorrow 5/19 at 2:00 pm and desires to come in for the class with her daughter.  She understands to check in at the front desk at [facility].

## 2019-12-31 ENCOUNTER — Inpatient Hospital Stay: Payer: BC Managed Care – PPO

## 2019-12-31 ENCOUNTER — Other Ambulatory Visit: Payer: Self-pay

## 2019-12-31 ENCOUNTER — Other Ambulatory Visit: Payer: Self-pay | Admitting: *Deleted

## 2019-12-31 ENCOUNTER — Encounter: Payer: Self-pay | Admitting: Oncology

## 2019-12-31 DIAGNOSIS — C182 Malignant neoplasm of ascending colon: Secondary | ICD-10-CM

## 2019-12-31 NOTE — Progress Notes (Signed)
Met with patient at registration to introduce myself as Financial Resource Specialist and to offer available resources. ° °Discussed one-time $1000 [employer] grant and qualifications to assist with personal expenses while going through treatment. ° °Gave her my card if interested in applying and for any additional financial questions or concerns. °

## 2019-12-31 NOTE — Progress Notes (Signed)
Pharmacist Chemotherapy Monitoring - Initial Assessment    Anticipated start date: 01/01/20   Regimen:  . Are orders appropriate based on the patient's diagnosis, regimen, and cycle? Yes . Does the plan date match the patient's scheduled date? Yes . Is the sequencing of drugs appropriate? Yes . Are the premedications appropriate for the patient's regimen? Yes . Prior Authorization for treatment is: Approved o If applicable, is the correct biosimilar selected based on the patient's insurance? not applicable  Organ Function and Labs: Marland Kitchen Are dose adjustments needed based on the patient's renal function, hepatic function, or hematologic function? No . Are appropriate labs ordered prior to the start of patient's treatment? Yes . Other organ system assessment, if indicated: N/A . The following baseline labs, if indicated, have been ordered: N/A  Dose Assessment: . Are the drug doses appropriate? Yes . Are the following correct: o Drug concentrations Yes o IV fluid compatible with drug Yes o Administration routes Yes o Timing of therapy Yes . If applicable, does the patient have documented access for treatment and/or plans for port-a-cath placement? yes . If applicable, have lifetime cumulative doses been properly documented and assessed? not applicable Lifetime Dose Tracking  No doses have been documented on this patient for the following tracked chemicals: Doxorubicin, Epirubicin, Idarubicin, Daunorubicin, Mitoxantrone, Bleomycin, Oxaliplatin, Carboplatin, Liposomal Doxorubicin  o   Toxicity Monitoring/Prevention: . The patient has the following take home antiemetics prescribed: Ondansetron and Prochlorperazine . The patient has the following take home medications prescribed: N/A . Medication allergies and previous infusion related reactions, if applicable, have been reviewed and addressed. Yes . The patient's current medication list has been assessed for drug-drug interactions with their  chemotherapy regimen. no significant drug-drug interactions were identified on review.  Order Review: . Are the treatment plan orders signed? Yes . Is the patient scheduled to see a provider prior to their treatment? Yes  I verify that I have reviewed each item in the above checklist and answered each question accordingly.  Norwood Levo [facility] 12/31/2019 10:24 AM

## 2020-01-01 ENCOUNTER — Encounter: Payer: Self-pay | Admitting: Nurse Practitioner

## 2020-01-01 ENCOUNTER — Inpatient Hospital Stay (HOSPITAL_BASED_OUTPATIENT_CLINIC_OR_DEPARTMENT_OTHER): Payer: BC Managed Care – PPO | Admitting: Nurse Practitioner

## 2020-01-01 ENCOUNTER — Other Ambulatory Visit: Payer: Self-pay

## 2020-01-01 ENCOUNTER — Inpatient Hospital Stay: Payer: BC Managed Care – PPO

## 2020-01-01 ENCOUNTER — Telehealth: Payer: Self-pay

## 2020-01-01 VITALS — BP 135/91 | HR 87 | Temp 98.9°F | Resp 18

## 2020-01-01 VITALS — BP 141/54 | HR 106 | Temp 97.7°F | Resp 17 | Ht 67.0 in | Wt 174.4 lb

## 2020-01-01 DIAGNOSIS — I82401 Acute embolism and thrombosis of unspecified deep veins of right lower extremity: Secondary | ICD-10-CM | POA: Diagnosis not present

## 2020-01-01 DIAGNOSIS — D649 Anemia, unspecified: Secondary | ICD-10-CM

## 2020-01-01 DIAGNOSIS — K7460 Unspecified cirrhosis of liver: Secondary | ICD-10-CM | POA: Diagnosis not present

## 2020-01-01 DIAGNOSIS — D500 Iron deficiency anemia secondary to blood loss (chronic): Secondary | ICD-10-CM | POA: Diagnosis not present

## 2020-01-01 DIAGNOSIS — Z5111 Encounter for antineoplastic chemotherapy: Secondary | ICD-10-CM | POA: Diagnosis not present

## 2020-01-01 DIAGNOSIS — C182 Malignant neoplasm of ascending colon: Secondary | ICD-10-CM

## 2020-01-01 DIAGNOSIS — C787 Secondary malignant neoplasm of liver and intrahepatic bile duct: Secondary | ICD-10-CM | POA: Diagnosis not present

## 2020-01-01 DIAGNOSIS — Z7901 Long term (current) use of anticoagulants: Secondary | ICD-10-CM | POA: Diagnosis not present

## 2020-01-01 DIAGNOSIS — Z452 Encounter for adjustment and management of vascular access device: Secondary | ICD-10-CM | POA: Diagnosis not present

## 2020-01-01 LAB — CBC WITH DIFFERENTIAL (CANCER CENTER ONLY)
Abs Immature Granulocytes: 0.17 10*3/uL — ABNORMAL HIGH (ref 0.00–0.07)
Basophils Absolute: 0.1 10*3/uL (ref 0.0–0.1)
Basophils Relative: 0 %
Eosinophils Absolute: 0.1 10*3/uL (ref 0.0–0.5)
Eosinophils Relative: 1 %
HCT: 25.1 % — ABNORMAL LOW (ref 36.0–46.0)
Hemoglobin: 7.5 g/dL — ABNORMAL LOW (ref 12.0–15.0)
Immature Granulocytes: 1 %
Lymphocytes Relative: 4 %
Lymphs Abs: 0.9 10*3/uL (ref 0.7–4.0)
MCH: 22.1 pg — ABNORMAL LOW (ref 26.0–34.0)
MCHC: 29.9 g/dL — ABNORMAL LOW (ref 30.0–36.0)
MCV: 74 fL — ABNORMAL LOW (ref 80.0–100.0)
Monocytes Absolute: 1.8 10*3/uL — ABNORMAL HIGH (ref 0.1–1.0)
Monocytes Relative: 9 %
Neutro Abs: 18.1 10*3/uL — ABNORMAL HIGH (ref 1.7–7.7)
Neutrophils Relative %: 85 %
Platelet Count: 616 10*3/uL — ABNORMAL HIGH (ref 150–400)
RBC: 3.39 MIL/uL — ABNORMAL LOW (ref 3.87–5.11)
WBC Count: 21.2 10*3/uL — ABNORMAL HIGH (ref 4.0–10.5)
nRBC: 0 % (ref 0.0–0.2)

## 2020-01-01 LAB — ABO/RH: ABO/RH(D): A POS

## 2020-01-01 LAB — SAMPLE TO BLOOD BANK

## 2020-01-01 LAB — PREPARE RBC (CROSSMATCH)

## 2020-01-01 MED ORDER — LEUCOVORIN CALCIUM INJECTION 350 MG
400.0000 mg/m2 | Freq: Once | INTRAVENOUS | Status: AC
  Administered 2020-01-01: 780 mg via INTRAVENOUS
  Filled 2020-01-01: qty 39

## 2020-01-01 MED ORDER — OXALIPLATIN CHEMO INJECTION 100 MG/20ML
85.0000 mg/m2 | Freq: Once | INTRAVENOUS | Status: AC
  Administered 2020-01-01: 165 mg via INTRAVENOUS
  Filled 2020-01-01: qty 33

## 2020-01-01 MED ORDER — SODIUM CHLORIDE 0.9 % IV SOLN
10.0000 mg | Freq: Once | INTRAVENOUS | Status: AC
  Administered 2020-01-01: 10 mg via INTRAVENOUS
  Filled 2020-01-01: qty 10

## 2020-01-01 MED ORDER — FLUOROURACIL CHEMO INJECTION 2.5 GM/50ML
400.0000 mg/m2 | Freq: Once | INTRAVENOUS | Status: AC
  Administered 2020-01-01: 800 mg via INTRAVENOUS
  Filled 2020-01-01: qty 16

## 2020-01-01 MED ORDER — SODIUM CHLORIDE 0.9 % IV SOLN
2000.0000 mg/m2 | INTRAVENOUS | Status: DC
  Administered 2020-01-01: 3900 mg via INTRAVENOUS
  Filled 2020-01-01: qty 78

## 2020-01-01 MED ORDER — PALONOSETRON HCL INJECTION 0.25 MG/5ML
0.2500 mg | Freq: Once | INTRAVENOUS | Status: AC
  Administered 2020-01-01: 0.25 mg via INTRAVENOUS

## 2020-01-01 MED ORDER — DEXTROSE 5 % IV SOLN
Freq: Once | INTRAVENOUS | Status: AC
  Filled 2020-01-01: qty 250

## 2020-01-01 MED ORDER — OXYCODONE-ACETAMINOPHEN 5-325 MG PO TABS: 1.0000 | ORAL_TABLET | ORAL | 0 refills | Status: DC | PRN

## 2020-01-01 MED ORDER — SODIUM CHLORIDE 0.9% IV SOLUTION
250.0000 mL | Freq: Once | INTRAVENOUS | Status: AC
  Administered 2020-01-01: 250 mL via INTRAVENOUS
  Filled 2020-01-01: qty 250

## 2020-01-01 NOTE — Progress Notes (Signed)
Sent schedule message to get patient scheduled to get 1 unit of blood Saturday 01/03/20

## 2020-01-01 NOTE — Progress Notes (Signed)
  [place] OFFICE PROGRESS NOTE   Diagnosis: Colon cancer  INTERVAL HISTORY:   Stacy Burns returns as scheduled.  She had an episode of vomiting this morning.  Leg swelling is better.  She denies bleeding but continues to note black stools which she thinks may be due to iron.  She is weaker and more short of breath since discharge from the hospital earlier this week.  She continues to have pain at the right abdomen.  Objective:  Vital signs in last 24 hours:  Blood pressure (!) 141/54, pulse (!) 106, temperature 97.7 F (36.5 C), temperature source Temporal, resp. rate 17, height 5' 7" (1.702 m), weight 174 lb 6.4 oz (79.1 kg), SpO2 100 %.    HEENT: No thrush or ulcers. Resp: Lungs clear bilaterally. Cardio: Regular rate and rhythm. GI: Abdomen soft.  No mass.  No hepatomegaly. Vascular: Trace edema right lower leg. Neuro: Alert and oriented. Skin: Pale appearing. Port-A-Cath without erythema.   Lab Results:  Lab Results  Component Value Date   WBC 21.2 (H) 01/01/2020   HGB 7.5 (L) 01/01/2020   HCT 25.1 (L) 01/01/2020   MCV 74.0 (L) 01/01/2020   PLT 616 (H) 01/01/2020   NEUTROABS 18.1 (H) 01/01/2020    Imaging:  No results found.  Medications: I have reviewed the patient's current medications.  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 Cirrhosis. Innumerable liver lesions.  Colonoscopy 12/18/2019-ulcerated partially obstructing large mass in the mid ascending colon. Biopsy invasive adenocarcinoma; preserved expression major MMR proteins  Upper endoscopy 12/18/2019-normal esophagus, stomach, examined duodenum  CEA 12/19/2019-8,923  Biopsy liver lesion 12/23/2019-adenocarcinoma  Cycle 1 FOLFOX 01/01/2020 2. Anemia secondary to GI blood loss 3. Cirrhosis 4. 12/26/2019-hospital admission for right lower extremity DVT and GI  bleed, treated with heparin anticoagulation beginning 12/26/2019, converted to Lovenox at discharge 12/29/2019 5. Low-grade fever-likely "tumor" fever  Disposition: Stacy Burns has metastatic colon cancer.  Plan to proceed with cycle 1 FOLFOX today as scheduled.  We reviewed the CBC from today.  Counts are adequate to proceed with treatment.  She has anemia, mildly progressive since hospital discharge, related to GI blood loss.  She is symptomatic.  We are making arrangements for a blood transfusion.  The right-sided abdominal pain may be related to metastatic disease in the liver or the primary tumor.  We will send a prescription to her pharmacy for hydrocodone.  She will return for lab, follow-up, possible IV iron in 1 week.  She will contact the office in the interim with any problems.  Patient seen with Dr. Benay Spice.    Ned Card ANP/GNP-BC   01/01/2020  11:40 AM Stacy Burns was interviewed and examined.  The hemoglobin is slightly lower today, but there has been no gross bleeding.  We will arrange for a red cell transfusion.  She will receive IV iron if the hemoglobin is not improved next week.  The plan is to begin FOLFOX chemotherapy today.  Julieanne Manson, MD

## 2020-01-01 NOTE — Patient Instructions (Signed)

## 2020-01-01 NOTE — Telephone Encounter (Signed)
Error

## 2020-01-01 NOTE — Patient Instructions (Signed)
[facility] Discharge Instructions for Patients Receiving Chemotherapy  Today you received the following chemotherapy agents: oxaliplatin, leucovorin, and fluorouracil.  To help prevent nausea and vomiting after your treatment, we encourage you to take your nausea medication as directed.   If you develop nausea and vomiting that is not controlled by your nausea medication, call the clinic.   BELOW ARE SYMPTOMS THAT SHOULD BE REPORTED IMMEDIATELY:  *FEVER GREATER THAN 100.5 F  *CHILLS WITH OR WITHOUT FEVER  NAUSEA AND VOMITING THAT IS NOT CONTROLLED WITH YOUR NAUSEA MEDICATION  *UNUSUAL SHORTNESS OF BREATH  *UNUSUAL BRUISING OR BLEEDING  TENDERNESS IN MOUTH AND THROAT WITH OR WITHOUT PRESENCE OF ULCERS  *URINARY PROBLEMS  *BOWEL PROBLEMS  UNUSUAL RASH Items with * indicate a potential emergency and should be followed up as soon as possible.  Feel free to call the clinic should you have any questions or concerns. The clinic phone number is [phone].  Please show the [facility] at check-in to the Emergency Department and triage nurse.  Oxaliplatin Injection What is this medicine? OXALIPLATIN (ox AL i PLA tin) is a chemotherapy drug. It targets fast dividing cells, like cancer cells, and causes these cells to die. This medicine is used to treat cancers of the colon and rectum, and many other cancers. This medicine may be used for other purposes; ask your health care provider or pharmacist if you have questions. COMMON BRAND NAME(S): Eloxatin What should I tell my health care provider before I take this medicine? They need to know if you have any of these conditions:  heart disease  history of irregular heartbeat  liver disease  low blood counts, like white cells, platelets, or red blood cells  lung or breathing disease, like asthma  take medicines that treat or prevent blood clots  tingling of the fingers or toes, or other nerve  disorder  an unusual or allergic reaction to oxaliplatin, other chemotherapy, other medicines, foods, dyes, or preservatives  pregnant or trying to get pregnant  breast-feeding How should I use this medicine? This drug is given as an infusion into a vein. It is administered in a hospital or clinic by a specially trained health care professional. Talk to your pediatrician regarding the use of this medicine in children. Special care may be needed. Overdosage: If you think you have taken too much of this medicine contact a poison control center or emergency room at once. NOTE: This medicine is only for you. Do not share this medicine with others. What if I miss a dose? It is important not to miss a dose. Call your doctor or health care professional if you are unable to keep an appointment. What may interact with this medicine? Do not take this medicine with any of the following medications:  cisapride  dronedarone  pimozide  thioridazine This medicine may also interact with the following medications:  aspirin and aspirin-like medicines  certain medicines that treat or prevent blood clots like warfarin, apixaban, dabigatran, and rivaroxaban  cisplatin  cyclosporine  diuretics  medicines for infection like acyclovir, adefovir, amphotericin B, bacitracin, cidofovir, foscarnet, ganciclovir, gentamicin, pentamidine, vancomycin  NSAIDs, medicines for pain and inflammation, like ibuprofen or naproxen  other medicines that prolong the QT interval (an abnormal heart rhythm)  pamidronate  zoledronic acid This list may not describe all possible interactions. Give your health care provider a list of all the medicines, herbs, non-prescription drugs, or dietary supplements you use. Also tell them if you smoke, drink  alcohol, or use illegal drugs. Some items may interact with your medicine. What should I watch for while using this medicine? Your condition will be monitored carefully  while you are receiving this medicine. You may need blood work done while you are taking this medicine. This medicine may make you feel generally unwell. This is not uncommon as chemotherapy can affect healthy cells as well as cancer cells. Report any side effects. Continue your course of treatment even though you feel ill unless your healthcare professional tells you to stop. This medicine can make you more sensitive to cold. Do not drink cold drinks or use ice. Cover exposed skin before coming in contact with cold temperatures or cold objects. When out in cold weather wear warm clothing and cover your mouth and nose to warm the air that goes into your lungs. Tell your doctor if you get sensitive to the cold. Do not become pregnant while taking this medicine or for 9 months after stopping it. Women should inform their health care professional if they wish to become pregnant or think they might be pregnant. Men should not father a child while taking this medicine and for 6 months after stopping it. There is potential for serious side effects to an unborn child. Talk to your health care professional for more information. Do not breast-feed a child while taking this medicine or for 3 months after stopping it. This medicine has caused ovarian failure in some women. This medicine may make it more difficult to get pregnant. Talk to your health care professional if you are concerned about your fertility. This medicine has caused decreased sperm counts in some men. This may make it more difficult to father a child. Talk to your health care professional if you are concerned about your fertility. This medicine may increase your risk of getting an infection. Call your health care professional for advice if you get a fever, chills, or sore throat, or other symptoms of a cold or flu. Do not treat yourself. Try to avoid being around people who are sick. Avoid taking medicines that contain aspirin, acetaminophen,  ibuprofen, naproxen, or ketoprofen unless instructed by your health care professional. These medicines may hide a fever. Be careful brushing or flossing your teeth or using a toothpick because you may get an infection or bleed more easily. If you have any dental work done, tell your dentist you are receiving this medicine. What side effects may I notice from receiving this medicine? Side effects that you should report to your doctor or health care professional as soon as possible:  allergic reactions like skin rash, itching or hives, swelling of the face, lips, or tongue  breathing problems  cough  low blood counts - this medicine may decrease the number of white blood cells, red blood cells, and platelets. You may be at increased risk for infections and bleeding  nausea, vomiting  pain, redness, or irritation at site where injected  pain, tingling, numbness in the hands or feet  signs and symptoms of bleeding such as bloody or black, tarry stools; red or dark [place] urine; spitting up blood or [place] material that looks like coffee grounds; red spots on the skin; unusual bruising or bleeding from the eyes, gums, or nose  signs and symptoms of a dangerous change in heartbeat or heart rhythm like chest pain; dizziness; fast, irregular heartbeat; palpitations; feeling faint or lightheaded; falls  signs and symptoms of infection like fever; chills; cough; sore throat; pain or trouble passing urine  signs and symptoms of liver injury like dark yellow or [place] urine; general ill feeling or flu-like symptoms; light-colored stools; loss of appetite; nausea; right upper belly pain; unusually weak or tired; yellowing of the eyes or skin  signs and symptoms of low red blood cells or anemia such as unusually weak or tired; feeling faint or lightheaded; falls  signs and symptoms of muscle injury like dark urine; trouble passing urine or change in the amount of urine; unusually weak or tired; muscle  pain; back pain Side effects that usually do not require medical attention (report to your doctor or health care professional if they continue or are bothersome):  changes in taste  diarrhea  gas  hair loss  loss of appetite  mouth sores This list may not describe all possible side effects. Call your doctor for medical advice about side effects. You may report side effects to FDA at 1-800-FDA-1088. Where should I keep my medicine? This drug is given in a hospital or clinic and will not be stored at home. NOTE: This sheet is a summary. It may not cover all possible information. If you have questions about this medicine, talk to your doctor, pharmacist, or health care provider.  2020 Elsevier/Gold Standard (2018-12-18 12:20:35)  Fluorouracil, 5-FU injection What is this medicine? FLUOROURACIL, 5-FU (flure oh YOOR a sil) is a chemotherapy drug. It slows the growth of cancer cells. This medicine is used to treat many types of cancer like breast cancer, colon or rectal cancer, pancreatic cancer, and stomach cancer. This medicine may be used for other purposes; ask your health care provider or pharmacist if you have questions. COMMON BRAND NAME(S): Adrucil What should I tell my health care provider before I take this medicine? They need to know if you have any of these conditions:  blood disorders  dihydropyrimidine dehydrogenase (DPD) deficiency  infection (especially a virus infection such as chickenpox, cold sores, or herpes)  kidney disease  liver disease  malnourished, poor nutrition  recent or ongoing radiation therapy  an unusual or allergic reaction to fluorouracil, other chemotherapy, other medicines, foods, dyes, or preservatives  pregnant or trying to get pregnant  breast-feeding How should I use this medicine? This drug is given as an infusion or injection into a vein. It is administered in a hospital or clinic by a specially trained health care  professional. Talk to your pediatrician regarding the use of this medicine in children. Special care may be needed. Overdosage: If you think you have taken too much of this medicine contact a poison control center or emergency room at once. NOTE: This medicine is only for you. Do not share this medicine with others. What if I miss a dose? It is important not to miss your dose. Call your doctor or health care professional if you are unable to keep an appointment. What may interact with this medicine?  allopurinol  cimetidine  dapsone  digoxin  hydroxyurea  leucovorin  levamisole  medicines for seizures like ethotoin, fosphenytoin, phenytoin  medicines to increase blood counts like filgrastim, pegfilgrastim, sargramostim  medicines that treat or prevent blood clots like warfarin, enoxaparin, and dalteparin  methotrexate  metronidazole  pyrimethamine  some other chemotherapy drugs like busulfan, cisplatin, estramustine, vinblastine  trimethoprim  trimetrexate  vaccines Talk to your doctor or health care professional before taking any of these medicines:  acetaminophen  aspirin  ibuprofen  ketoprofen  naproxen This list may not describe all possible interactions. Give your health care provider a list of  all the medicines, herbs, non-prescription drugs, or dietary supplements you use. Also tell them if you smoke, drink alcohol, or use illegal drugs. Some items may interact with your medicine. What should I watch for while using this medicine? Visit your doctor for checks on your progress. This drug may make you feel generally unwell. This is not uncommon, as chemotherapy can affect healthy cells as well as cancer cells. Report any side effects. Continue your course of treatment even though you feel ill unless your doctor tells you to stop. In some cases, you may be given additional medicines to help with side effects. Follow all directions for their use. Call your  doctor or health care professional for advice if you get a fever, chills or sore throat, or other symptoms of a cold or flu. Do not treat yourself. This drug decreases your body's ability to fight infections. Try to avoid being around people who are sick. This medicine may increase your risk to bruise or bleed. Call your doctor or health care professional if you notice any unusual bleeding. Be careful brushing and flossing your teeth or using a toothpick because you may get an infection or bleed more easily. If you have any dental work done, tell your dentist you are receiving this medicine. Avoid taking products that contain aspirin, acetaminophen, ibuprofen, naproxen, or ketoprofen unless instructed by your doctor. These medicines may hide a fever. Do not become pregnant while taking this medicine. Women should inform their doctor if they wish to become pregnant or think they might be pregnant. There is a potential for serious side effects to an unborn child. Talk to your health care professional or pharmacist for more information. Do not breast-feed an infant while taking this medicine. Men should inform their doctor if they wish to father a child. This medicine may lower sperm counts. Do not treat diarrhea with over the counter products. Contact your doctor if you have diarrhea that lasts more than 2 days or if it is severe and watery. This medicine can make you more sensitive to the sun. Keep out of the sun. If you cannot avoid being in the sun, wear protective clothing and use sunscreen. Do not use sun lamps or tanning beds/booths. What side effects may I notice from receiving this medicine? Side effects that you should report to your doctor or health care professional as soon as possible:  allergic reactions like skin rash, itching or hives, swelling of the face, lips, or tongue  low blood counts - this medicine may decrease the number of white blood cells, red blood cells and platelets. You may  be at increased risk for infections and bleeding.  signs of infection - fever or chills, cough, sore throat, pain or difficulty passing urine  signs of decreased platelets or bleeding - bruising, pinpoint red spots on the skin, black, tarry stools, blood in the urine  signs of decreased red blood cells - unusually weak or tired, fainting spells, lightheadedness  breathing problems  changes in vision  chest pain  mouth sores  nausea and vomiting  pain, swelling, redness at site where injected  pain, tingling, numbness in the hands or feet  redness, swelling, or sores on hands or feet  stomach pain  unusual bleeding Side effects that usually do not require medical attention (report to your doctor or health care professional if they continue or are bothersome):  changes in finger or toe nails  diarrhea  dry or itchy skin  hair loss  headache  loss of appetite  sensitivity of eyes to the light  stomach upset  unusually teary eyes This list may not describe all possible side effects. Call your doctor for medical advice about side effects. You may report side effects to FDA at 1-800-FDA-1088. Where should I keep my medicine? This drug is given in a hospital or clinic and will not be stored at home. NOTE: This sheet is a summary. It may not cover all possible information. If you have questions about this medicine, talk to your doctor, pharmacist, or health care provider.  2020 Elsevier/Gold Standard (2007-12-04 13:53:16)

## 2020-01-01 NOTE — Progress Notes (Signed)
Per Ned Card, NP, ok to treat with hemoglobin 7.5 today. Consulted pharmacy to determine sequencing; will do oxaliplatin/leucovorin, 5FU bolus, 1u pRBCs, then 5FU pump per their recommendation. Patient will return on Saturday for pump d/c and another unit of pRBCs.

## 2020-01-02 ENCOUNTER — Telehealth: Payer: Self-pay | Admitting: Emergency Medicine

## 2020-01-02 ENCOUNTER — Other Ambulatory Visit: Payer: Self-pay | Admitting: Emergency Medicine

## 2020-01-02 ENCOUNTER — Telehealth: Payer: Self-pay | Admitting: Oncology

## 2020-01-02 DIAGNOSIS — D649 Anemia, unspecified: Secondary | ICD-10-CM

## 2020-01-02 LAB — PREPARE RBC (CROSSMATCH)

## 2020-01-02 NOTE — Telephone Encounter (Signed)
PER NP LISA THOMAS OK TO STOP 5FU CHEMO PUMP EARLY AT 1:45 PM ON 01/03/20 VISIT

## 2020-01-02 NOTE — Telephone Encounter (Signed)
Scheduled per 5/20 los. Pt aware if appts.

## 2020-01-03 ENCOUNTER — Other Ambulatory Visit: Payer: Self-pay

## 2020-01-03 ENCOUNTER — Inpatient Hospital Stay: Payer: BC Managed Care – PPO

## 2020-01-03 VITALS — BP 154/70 | HR 77 | Temp 98.8°F | Resp 18

## 2020-01-03 DIAGNOSIS — D649 Anemia, unspecified: Secondary | ICD-10-CM

## 2020-01-03 DIAGNOSIS — Z5111 Encounter for antineoplastic chemotherapy: Secondary | ICD-10-CM | POA: Diagnosis not present

## 2020-01-03 DIAGNOSIS — C182 Malignant neoplasm of ascending colon: Secondary | ICD-10-CM

## 2020-01-03 MED ORDER — HEPARIN SOD (PORK) LOCK FLUSH 100 UNIT/ML IV SOLN
500.0000 [IU] | Freq: Once | INTRAVENOUS | Status: AC | PRN
  Administered 2020-01-03: 500 [IU]
  Filled 2020-01-03: qty 5

## 2020-01-03 MED ORDER — SODIUM CHLORIDE 0.9% FLUSH
10.0000 mL | INTRAVENOUS | Status: DC | PRN
  Administered 2020-01-03: 10 mL
  Filled 2020-01-03: qty 10

## 2020-01-03 MED ORDER — SODIUM CHLORIDE 0.9% IV SOLUTION
250.0000 mL | Freq: Once | INTRAVENOUS | Status: AC
  Administered 2020-01-03: 250 mL via INTRAVENOUS
  Filled 2020-01-03: qty 250

## 2020-01-03 NOTE — Patient Instructions (Signed)

## 2020-01-04 ENCOUNTER — Other Ambulatory Visit: Payer: Self-pay | Admitting: Oncology

## 2020-01-05 LAB — BPAM RBC
Blood Product Expiration Date: 202106032359
Blood Product Expiration Date: 202106042359
ISSUE DATE / TIME: 202105201604
ISSUE DATE / TIME: 202105221056
Unit Type and Rh: 6200
Unit Type and Rh: 6200

## 2020-01-05 LAB — TYPE AND SCREEN
ABO/RH(D): A POS
Antibody Screen: NEGATIVE
Unit division: 0
Unit division: 0

## 2020-01-06 ENCOUNTER — Other Ambulatory Visit: Payer: Self-pay | Admitting: *Deleted

## 2020-01-06 DIAGNOSIS — C182 Malignant neoplasm of ascending colon: Secondary | ICD-10-CM

## 2020-01-07 ENCOUNTER — Other Ambulatory Visit: Payer: Self-pay

## 2020-01-07 ENCOUNTER — Inpatient Hospital Stay: Payer: BC Managed Care – PPO

## 2020-01-07 ENCOUNTER — Inpatient Hospital Stay: Payer: BC Managed Care – PPO | Admitting: Nurse Practitioner

## 2020-01-07 ENCOUNTER — Encounter: Payer: Self-pay | Admitting: Nurse Practitioner

## 2020-01-07 VITALS — BP 139/63 | HR 102 | Temp 98.6°F | Resp 14 | Wt 169.1 lb

## 2020-01-07 DIAGNOSIS — Z95828 Presence of other vascular implants and grafts: Secondary | ICD-10-CM

## 2020-01-07 DIAGNOSIS — C182 Malignant neoplasm of ascending colon: Secondary | ICD-10-CM

## 2020-01-07 DIAGNOSIS — Z5111 Encounter for antineoplastic chemotherapy: Secondary | ICD-10-CM | POA: Diagnosis not present

## 2020-01-07 DIAGNOSIS — D649 Anemia, unspecified: Secondary | ICD-10-CM

## 2020-01-07 LAB — CBC WITH DIFFERENTIAL (CANCER CENTER ONLY)
Abs Immature Granulocytes: 0.06 10*3/uL (ref 0.00–0.07)
Basophils Absolute: 0 10*3/uL (ref 0.0–0.1)
Basophils Relative: 0 %
Eosinophils Absolute: 0.4 10*3/uL (ref 0.0–0.5)
Eosinophils Relative: 4 %
HCT: 30.5 % — ABNORMAL LOW (ref 36.0–46.0)
Hemoglobin: 9.2 g/dL — ABNORMAL LOW (ref 12.0–15.0)
Immature Granulocytes: 1 %
Lymphocytes Relative: 9 %
Lymphs Abs: 0.9 10*3/uL (ref 0.7–4.0)
MCH: 23.7 pg — ABNORMAL LOW (ref 26.0–34.0)
MCHC: 30.2 g/dL (ref 30.0–36.0)
MCV: 78.4 fL — ABNORMAL LOW (ref 80.0–100.0)
Monocytes Absolute: 0.3 10*3/uL (ref 0.1–1.0)
Monocytes Relative: 3 %
Neutro Abs: 8.2 10*3/uL — ABNORMAL HIGH (ref 1.7–7.7)
Neutrophils Relative %: 83 %
Platelet Count: 475 10*3/uL — ABNORMAL HIGH (ref 150–400)
RBC: 3.89 MIL/uL (ref 3.87–5.11)
RDW: 26.4 % — ABNORMAL HIGH (ref 11.5–15.5)
WBC Count: 10 10*3/uL (ref 4.0–10.5)
nRBC: 0 % (ref 0.0–0.2)

## 2020-01-07 LAB — TYPE AND SCREEN
ABO/RH(D): A POS
Antibody Screen: NEGATIVE

## 2020-01-07 MED ORDER — SODIUM CHLORIDE 0.9% FLUSH
10.0000 mL | INTRAVENOUS | Status: DC | PRN
  Administered 2020-01-07: 10 mL via INTRAVENOUS
  Filled 2020-01-07: qty 10

## 2020-01-07 NOTE — Progress Notes (Signed)
Patient had question in regard to her paperwork for work. Patient wants to know if her paperwork was received and filled out. She stated that she had spoken to someone and wants to make sure paperwork was filled out correctly and sent. Staff message sent to [facility] with [employer]ML[employer] to follow up about paperwork.

## 2020-01-07 NOTE — Progress Notes (Addendum)
  [name] OFFICE PROGRESS NOTE   Diagnosis: Colon cancer  INTERVAL HISTORY:   Stacy Burns returns as scheduled.  She completed cycle 1 FOLFOX 01/01/2020.  She denies nausea/vomiting. No mouth sores.  No change in baseline loose stools following chemotherapy.  Appetite remains poor.  Stool was "Martes" this morning.  Objective:  Vital signs in last 24 hours:  Blood pressure 139/63, pulse (!) 102, temperature 98.6 F (37 C), temperature source Oral, resp. rate 14, weight 169 lb 1.6 oz (76.7 kg), SpO2 99 %.    HEENT: No thrush or ulcers. GI: Abdomen soft with tenderness at the right upper abdomen.  No hepatomegaly. Vascular: No leg edema. Neuro: Alert and oriented. Port-A-Cath without erythema.  Lab Results:  Lab Results  Component Value Date   WBC 10.0 01/07/2020   HGB 9.2 (L) 01/07/2020   HCT 30.5 (L) 01/07/2020   MCV 78.4 (L) 01/07/2020   PLT 475 (H) 01/07/2020   NEUTROABS 8.2 (H) 01/07/2020    Imaging:  No results found.  Medications: I have reviewed the patient's current medications.  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 Cirrhosis. Innumerable liver lesions.  Colonoscopy 12/18/2019-ulcerated partially obstructing large mass in the mid ascending colon. Biopsy invasive adenocarcinoma; preserved expression major MMR proteins  Upper endoscopy 12/18/2019-normal esophagus, stomach, examined duodenum  CEA 12/19/2019-8,923  Biopsy liver lesion 12/23/2019-adenocarcinoma  Cycle 1 FOLFOX 01/01/2020 2. Anemia secondary to GI blood loss 3. Cirrhosis 4. 12/26/2019-hospital admission for right lower extremity DVT and GI bleed, treated with heparin anticoagulation beginning 12/26/2019, converted to Lovenox at discharge 12/29/2019 5. Low-grade fever-likely "tumor" fever   Disposition: Stacy Burns appears stable.  She tolerated the first  cycle of FOLFOX well.  We reviewed the CBC from today.  Hemoglobin is better.  She will return for lab, follow-up, cycle 2 FOLFOX next week as scheduled.  She will contact the office in the interim with any problems.  Patient seen with Dr. Benay Spice.  Ned Card ANP/GNP-BC   01/07/2020  11:01 AM  This was a shared visit with Ned Card.  Stacy Burns tolerated the first cycle of FOLFOX without significant acute toxicity.  The hemoglobin has improved following the red cell transfusion.  Julieanne Manson, MD

## 2020-01-07 NOTE — Patient Instructions (Signed)

## 2020-01-11 ENCOUNTER — Other Ambulatory Visit: Payer: Self-pay | Admitting: Oncology

## 2020-01-14 ENCOUNTER — Other Ambulatory Visit: Payer: Self-pay | Admitting: *Deleted

## 2020-01-14 DIAGNOSIS — C182 Malignant neoplasm of ascending colon: Secondary | ICD-10-CM

## 2020-01-15 ENCOUNTER — Inpatient Hospital Stay: Payer: BC Managed Care – PPO | Attending: Nurse Practitioner

## 2020-01-15 ENCOUNTER — Inpatient Hospital Stay: Payer: BC Managed Care – PPO

## 2020-01-15 ENCOUNTER — Telehealth: Payer: Self-pay | Admitting: Nurse Practitioner

## 2020-01-15 ENCOUNTER — Encounter: Payer: Self-pay | Admitting: Nurse Practitioner

## 2020-01-15 ENCOUNTER — Other Ambulatory Visit: Payer: Self-pay

## 2020-01-15 ENCOUNTER — Inpatient Hospital Stay (HOSPITAL_BASED_OUTPATIENT_CLINIC_OR_DEPARTMENT_OTHER): Payer: BC Managed Care – PPO | Admitting: Nurse Practitioner

## 2020-01-15 VITALS — BP 118/74 | HR 86

## 2020-01-15 VITALS — BP 112/50 | HR 101 | Temp 97.9°F | Resp 18 | Ht 67.0 in | Wt 168.0 lb

## 2020-01-15 DIAGNOSIS — K7460 Unspecified cirrhosis of liver: Secondary | ICD-10-CM | POA: Diagnosis not present

## 2020-01-15 DIAGNOSIS — C182 Malignant neoplasm of ascending colon: Secondary | ICD-10-CM

## 2020-01-15 DIAGNOSIS — D500 Iron deficiency anemia secondary to blood loss (chronic): Secondary | ICD-10-CM | POA: Diagnosis not present

## 2020-01-15 DIAGNOSIS — Z86718 Personal history of other venous thrombosis and embolism: Secondary | ICD-10-CM | POA: Insufficient documentation

## 2020-01-15 DIAGNOSIS — Z7901 Long term (current) use of anticoagulants: Secondary | ICD-10-CM | POA: Insufficient documentation

## 2020-01-15 DIAGNOSIS — Z5189 Encounter for other specified aftercare: Secondary | ICD-10-CM | POA: Diagnosis not present

## 2020-01-15 DIAGNOSIS — Z452 Encounter for adjustment and management of vascular access device: Secondary | ICD-10-CM | POA: Insufficient documentation

## 2020-01-15 DIAGNOSIS — C787 Secondary malignant neoplasm of liver and intrahepatic bile duct: Secondary | ICD-10-CM | POA: Insufficient documentation

## 2020-01-15 DIAGNOSIS — Z5111 Encounter for antineoplastic chemotherapy: Secondary | ICD-10-CM | POA: Insufficient documentation

## 2020-01-15 DIAGNOSIS — Z95828 Presence of other vascular implants and grafts: Secondary | ICD-10-CM

## 2020-01-15 LAB — CBC WITH DIFFERENTIAL (CANCER CENTER ONLY)
Abs Immature Granulocytes: 0.01 10*3/uL (ref 0.00–0.07)
Basophils Absolute: 0 10*3/uL (ref 0.0–0.1)
Basophils Relative: 1 %
Eosinophils Absolute: 0.4 10*3/uL (ref 0.0–0.5)
Eosinophils Relative: 7 %
HCT: 29.9 % — ABNORMAL LOW (ref 36.0–46.0)
Hemoglobin: 9 g/dL — ABNORMAL LOW (ref 12.0–15.0)
Immature Granulocytes: 0 %
Lymphocytes Relative: 17 %
Lymphs Abs: 0.9 10*3/uL (ref 0.7–4.0)
MCH: 24 pg — ABNORMAL LOW (ref 26.0–34.0)
MCHC: 30.1 g/dL (ref 30.0–36.0)
MCV: 79.7 fL — ABNORMAL LOW (ref 80.0–100.0)
Monocytes Absolute: 1.7 10*3/uL — ABNORMAL HIGH (ref 0.1–1.0)
Monocytes Relative: 31 %
Neutro Abs: 2.3 10*3/uL (ref 1.7–7.7)
Neutrophils Relative %: 44 %
Platelet Count: 484 10*3/uL — ABNORMAL HIGH (ref 150–400)
RBC: 3.75 MIL/uL — ABNORMAL LOW (ref 3.87–5.11)
RDW: 27.8 % — ABNORMAL HIGH (ref 11.5–15.5)
WBC Count: 5.3 10*3/uL (ref 4.0–10.5)
nRBC: 0 % (ref 0.0–0.2)

## 2020-01-15 LAB — CMP (CANCER CENTER ONLY)
ALT: 21 U/L (ref 0–44)
AST: 37 U/L (ref 15–41)
Albumin: 2.4 g/dL — ABNORMAL LOW (ref 3.5–5.0)
Alkaline Phosphatase: 271 U/L — ABNORMAL HIGH (ref 38–126)
Anion gap: 11 (ref 5–15)
BUN: 14 mg/dL (ref 8–23)
CO2: 21 mmol/L — ABNORMAL LOW (ref 22–32)
Calcium: 8.7 mg/dL — ABNORMAL LOW (ref 8.9–10.3)
Chloride: 104 mmol/L (ref 98–111)
Creatinine: 0.9 mg/dL (ref 0.44–1.00)
GFR, Est AFR Am: >60 mL/min (ref >60)
GFR, Estimated: >60 mL/min (ref >60)
Glucose, Bld: 90 mg/dL (ref 70–99)
Potassium: 4.3 mmol/L (ref 3.5–5.1)
Sodium: 136 mmol/L (ref 135–145)
Total Bilirubin: 0.7 mg/dL (ref 0.3–1.2)
Total Protein: 6.5 g/dL (ref 6.5–8.1)

## 2020-01-15 MED ORDER — DEXTROSE 5 % IV SOLN
Freq: Once | INTRAVENOUS | Status: AC
  Filled 2020-01-15: qty 250

## 2020-01-15 MED ORDER — OXALIPLATIN CHEMO INJECTION 100 MG/20ML
85.0000 mg/m2 | Freq: Once | INTRAVENOUS | Status: AC
  Administered 2020-01-15: 165 mg via INTRAVENOUS
  Filled 2020-01-15: qty 33

## 2020-01-15 MED ORDER — LEUCOVORIN CALCIUM INJECTION 350 MG
400.0000 mg/m2 | Freq: Once | INTRAVENOUS | Status: AC
  Administered 2020-01-15: 780 mg via INTRAVENOUS
  Filled 2020-01-15: qty 39

## 2020-01-15 MED ORDER — FLUOROURACIL CHEMO INJECTION 2.5 GM/50ML
400.0000 mg/m2 | Freq: Once | INTRAVENOUS | Status: AC
  Administered 2020-01-15: 800 mg via INTRAVENOUS
  Filled 2020-01-15: qty 16

## 2020-01-15 MED ORDER — HEPARIN SOD (PORK) LOCK FLUSH 100 UNIT/ML IV SOLN
500.0000 [IU] | Freq: Once | INTRAVENOUS | Status: DC | PRN
  Filled 2020-01-15: qty 5

## 2020-01-15 MED ORDER — SODIUM CHLORIDE 0.9% FLUSH
10.0000 mL | INTRAVENOUS | Status: DC | PRN
  Administered 2020-01-15: 10 mL via INTRAVENOUS
  Filled 2020-01-15: qty 10

## 2020-01-15 MED ORDER — SODIUM CHLORIDE 0.9% FLUSH
10.0000 mL | INTRAVENOUS | Status: DC | PRN
  Filled 2020-01-15: qty 10

## 2020-01-15 MED ORDER — SODIUM CHLORIDE 0.9 % IV SOLN
10.0000 mg | Freq: Once | INTRAVENOUS | Status: AC
  Administered 2020-01-15: 10 mg via INTRAVENOUS
  Filled 2020-01-15: qty 10

## 2020-01-15 MED ORDER — SODIUM CHLORIDE 0.9 % IV SOLN
2000.0000 mg/m2 | INTRAVENOUS | Status: DC
  Administered 2020-01-15: 3900 mg via INTRAVENOUS
  Filled 2020-01-15: qty 78

## 2020-01-15 MED ORDER — PALONOSETRON HCL INJECTION 0.25 MG/5ML
INTRAVENOUS | Status: AC
  Filled 2020-01-15: qty 5

## 2020-01-15 MED ORDER — PALONOSETRON HCL INJECTION 0.25 MG/5ML
0.2500 mg | Freq: Once | INTRAVENOUS | Status: AC
  Administered 2020-01-15: 0.25 mg via INTRAVENOUS

## 2020-01-15 NOTE — Progress Notes (Signed)
Stacy Burns   [phone] [phone]   Clinic Follow up Note   Patient Care Team: Ferd Hibbs, NP as PCP - General (Nurse Practitioner) Jonnie Finner, RN as Oncology Nurse Navigator Ladell Pier, MD as Consulting Physician (Oncology) Owens Shark, NP as Nurse Practitioner (Hematology and Oncology) 01/15/2020  CHIEF COMPLAINT: F/u colon cancer   CURRENT THERAPY: FOLFOX chemotherapy   INTERVAL HISTORY: Stacy Burns returns for f/u and treatment as scheduled. She completed cycle 1 FOLFOX on 01/01/20. She is Burns better than when she was diagnosed, feels like doing more. Still tired at times and takes up to 2 naps at home. When not resting she is able to be up out of bed and active at home. She is not drinking as much as she should, occasionally dizzy on standing if she does not take her time. Appetite is adequate, taking Boost/ensure. Wondering if she can take pepcid for burping recommended by PCP. Cold sensitivity lasted 6 days, little longer in her fingers. 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 Denies n/v/c/d, pain, bleeding, mucositis, fever, chills, cough, chest pain, dyspnea, leg swelling. She continues lovenox.    MEDICAL HISTORY:  Past Medical History:  Diagnosis Date  . COVID-19 virus infection 08/2019   not hospitalized.    . Severe anemia 12/17/2019    SURGICAL HISTORY: Past Surgical History:  Procedure Laterality Date  . BIOPSY  12/18/2019   Procedure: BIOPSY;  Surgeon: Thornton Park, MD;  Location: [facility];  Service: Gastroenterology;;  . COLONOSCOPY WITH PROPOFOL N/A 12/18/2019   Procedure: COLONOSCOPY WITH PROPOFOL;  Surgeon: Thornton Park, MD;  Location: [facility];  Service: Gastroenterology;  Laterality: N/A;  . ESOPHAGOGASTRODUODENOSCOPY (EGD) WITH PROPOFOL N/A 12/18/2019   Procedure: ESOPHAGOGASTRODUODENOSCOPY (EGD) WITH PROPOFOL;  Surgeon: Thornton Park, MD;  Location: [facility];  Service: Gastroenterology;   Laterality: N/A;  . PORTACATH PLACEMENT N/A 12/19/2019   Procedure: INSERTION PORT-A-CATH WITH ULTRASOUND;  Surgeon: Rolm Bookbinder, MD;  Location: [place] [facility];  Service: General;  Laterality: N/A;  . SUBMUCOSAL TATTOO INJECTION  12/18/2019   Procedure: SUBMUCOSAL TATTOO INJECTION;  Surgeon: Thornton Park, MD;  Location: [facility];  Service: Gastroenterology;;  . TUBAL LIGATION      I have reviewed the social history and family history with the patient and they are unchanged from previous note.  ALLERGIES:  is allergic to milk-related compounds; penicillins; pineapple; chocolate; hydrocodone; other; and sulfa antibiotics.  MEDICATIONS:  Current Outpatient Medications  Medication Sig Dispense Refill  . enoxaparin (LOVENOX) 120 MG/0.8ML injection Inject 0.8 mLs (120 mg total) into the skin daily. Please dispense 30 syringes 30 mL 1  . ferrous sulfate 325 (65 FE) MG tablet Take 1 tablet (325 mg total) by mouth 2 (two) times daily with a meal. 60 tablet 0  . lidocaine-prilocaine (EMLA) cream Apply 1 application topically as needed. 30 g 0  . ondansetron (ZOFRAN) 8 MG tablet Take 1 tablet (8 mg total) by mouth every 8 (eight) hours as needed for nausea or vomiting. (Patient not taking: Reported on 01/15/2020) 30 tablet 0  . oxyCODONE-acetaminophen (PERCOCET/ROXICET) 5-325 MG tablet Take 1 tablet by mouth every 4 (four) hours as needed for severe pain. (Patient not taking: Reported on 01/15/2020) 60 tablet 0  . prochlorperazine (COMPAZINE) 10 MG tablet TAKE 1 TABLET(10 MG) BY MOUTH EVERY 6 HOURS AS NEEDED FOR NAUSEA OR VOMITING (Patient not taking: Reported on 01/15/2020) 60 tablet 1   No current facility-administered medications for this visit.   Facility-Administered Medications Ordered in Other  Visits  Medication Dose Route Frequency Provider Last Rate Last Admin  . fluorouracil (ADRUCIL) 3,900 mg in sodium chloride 0.9 % 72 mL chemo infusion  2,000 mg/m2 (Treatment Plan Recorded) Intravenous 1 day  or 1 dose Ladell Pier, MD      . fluorouracil (ADRUCIL) chemo injection 800 mg  400 mg/m2 (Treatment Plan Recorded) Intravenous Once Ladell Pier, MD      . heparin lock flush 100 unit/mL  500 Units Intracatheter Once PRN Ladell Pier, MD      . leucovorin 780 mg in dextrose 5 % 250 mL infusion  400 mg/m2 (Treatment Plan Recorded) Intravenous Once Ladell Pier, MD 145 mL/hr at 01/15/20 1131 780 mg at 01/15/20 1131  . oxaliplatin (ELOXATIN) 165 mg in dextrose 5 % 500 mL chemo infusion  85 mg/m2 (Treatment Plan Recorded) Intravenous Once Ladell Pier, MD 267 mL/hr at 01/15/20 1125 165 mg at 01/15/20 1125  . sodium chloride flush (NS) 0.9 % injection 10 mL  10 mL Intracatheter PRN Ladell Pier, MD        PHYSICAL EXAMINATION:  Vitals:   01/15/20 0913  BP: (!) 112/50  Pulse: (!) 101  Resp: 18  Temp: 97.9 F (36.6 C)  SpO2: 100%   Filed Weights   01/15/20 0913  Weight: 168 lb (76.2 kg)    GENERAL:alert, no distress and comfortable SKIN: no rash. Scattered ecchymoses on the abdomen  EYES: sclera clear LUNGS:  normal breathing effort HEART:  no lower extremity edema NEURO: alert & oriented x 3 with fluent speech, normal gait PAC without erythema  Limited exam to observation for COVID19 precautions  LABORATORY DATA:  I have reviewed the data as listed CBC Latest Ref Rng & Units 01/15/2020 01/07/2020 01/01/2020  WBC 4.0 - 10.5 K/uL 5.3 10.0 21.2(H)  Hemoglobin 12.0 - 15.0 g/dL 9.0(L) 9.2(L) 7.5(L)  Hematocrit 36.0 - 46.0 % 29.9(L) 30.5(L) 25.1(L)  Platelets 150 - 400 K/uL 484(H) 475(H) 616(H)     CMP Latest Ref Rng & Units 01/15/2020 12/29/2019 12/27/2019  Glucose 70 - 99 mg/dL 90 98 122(H)  BUN 8 - 23 mg/dL '14 15 17  '$ Creatinine 0.44 - 1.00 mg/dL 0.90 0.90 0.87  Sodium 135 - 145 mmol/L 136 136 135  Potassium 3.5 - 5.1 mmol/L 4.3 3.5 3.7  Chloride 98 - 111 mmol/L 104 106 107  CO2 22 - 32 mmol/L 21(L) 22 19(L)  Calcium 8.9 - 10.3 mg/dL 8.7(L) 8.3(L) 7.9(L)    Total Protein 6.5 - 8.1 g/dL 6.5 - 5.7(L)  Total Bilirubin 0.3 - 1.2 mg/dL 0.7 - 0.6  Alkaline Phos 38 - 126 U/L 271(H) - 193(H)  AST 15 - 41 U/L 37 - 44(H)  ALT 0 - 44 U/L 21 - 22      RADIOGRAPHIC STUDIES: I have personally reviewed the radiological images as listed and agreed with the findings in the report. No results found.   ASSESSMENT & PLAN:   1. 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 Cirrhosis. Innumerable liver lesions.  Colonoscopy 12/18/2019-ulcerated partially obstructing large mass in the mid ascending colon. Biopsy invasive adenocarcinoma; preserved expression major MMR proteins  Upper endoscopy 12/18/2019-normal esophagus, stomach, examined duodenum  CEA 12/19/2019-8,923  Biopsy liver lesion 12/23/2019-adenocarcinoma  Cycle 1 FOLFOX 01/01/2020  Cycle 2 FOLFOX 01/15/2020  2. Anemia secondary to GI blood loss, on oral iron BID  3. Cirrhosis 4. 12/26/2019-hospital admission for  right lower extremity DVT and GI bleed, treated with heparin anticoagulation beginning 12/26/2019, converted to Lovenox at discharge 12/29/2019 5. Low-grade fever-likely "tumor"fever   Disposition:  Stacy Burns appears stable. She completed Cycle 1 FOLFOX without significant toxicities. Her fatigue has improved since initial diagnosis, I encouraged her to remain active. She can use pepcid for reflux.   CBC and CMP are stable, adequate for treatment. BP is lower than her baseline, I encouraged her to increase her liquid intake. BP will be repeated in the treatment room.   She will proceed with cycle 2 FOLFOX today as planned. She will return for f/u and cycle 3 in 2 weeks.     All questions were answered. The patient knows to call the clinic with any problems, questions or concerns. No barriers to learning were detected.     Stacy Feeling, NP 01/15/20

## 2020-01-15 NOTE — Telephone Encounter (Signed)
Scheduled appt per 6/3 los. °

## 2020-01-15 NOTE — Patient Instructions (Signed)
Cancer Center Discharge Instructions for Patients Receiving Chemotherapy  Today you received the following chemotherapy agents: Oxaliplatin/Leucovorin/5FU  To help prevent nausea and vomiting after your treatment, we encourage you to take your nausea medication as directed.   If you develop nausea and vomiting that is not controlled by your nausea medication, call the clinic.   BELOW ARE SYMPTOMS THAT SHOULD BE REPORTED IMMEDIATELY:  *FEVER GREATER THAN 100.5 F  *CHILLS WITH OR WITHOUT FEVER  NAUSEA AND VOMITING THAT IS NOT CONTROLLED WITH YOUR NAUSEA MEDICATION  *UNUSUAL SHORTNESS OF BREATH  *UNUSUAL BRUISING OR BLEEDING  TENDERNESS IN MOUTH AND THROAT WITH OR WITHOUT PRESENCE OF ULCERS  *URINARY PROBLEMS  *BOWEL PROBLEMS  UNUSUAL RASH Items with * indicate a potential emergency and should be followed up as soon as possible.  Feel free to call the clinic should you have any questions or concerns. The clinic phone number is [phone].  Please show the CHEMO ALERT CARD at check-in to the Emergency Department and triage nurse.   

## 2020-01-17 ENCOUNTER — Inpatient Hospital Stay: Payer: BC Managed Care – PPO

## 2020-01-17 ENCOUNTER — Other Ambulatory Visit: Payer: Self-pay

## 2020-01-17 VITALS — BP 134/59 | HR 90 | Temp 97.9°F | Resp 18

## 2020-01-17 DIAGNOSIS — C182 Malignant neoplasm of ascending colon: Secondary | ICD-10-CM

## 2020-01-17 DIAGNOSIS — Z5111 Encounter for antineoplastic chemotherapy: Secondary | ICD-10-CM | POA: Diagnosis not present

## 2020-01-17 MED ORDER — SODIUM CHLORIDE 0.9% FLUSH
10.0000 mL | INTRAVENOUS | Status: DC | PRN
  Administered 2020-01-17: 10 mL
  Filled 2020-01-17: qty 10

## 2020-01-17 MED ORDER — HEPARIN SOD (PORK) LOCK FLUSH 100 UNIT/ML IV SOLN
500.0000 [IU] | Freq: Once | INTRAVENOUS | Status: AC | PRN
  Administered 2020-01-17: 500 [IU]
  Filled 2020-01-17: qty 5

## 2020-01-17 MED ORDER — HEPARIN SOD (PORK) LOCK FLUSH 100 UNIT/ML IV SOLN
500.0000 [IU] | Freq: Once | INTRAVENOUS | Status: DC | PRN
  Filled 2020-01-17: qty 5

## 2020-01-17 MED ORDER — SODIUM CHLORIDE 0.9% FLUSH
10.0000 mL | INTRAVENOUS | Status: DC | PRN
  Filled 2020-01-17: qty 10

## 2020-01-17 NOTE — Patient Instructions (Signed)

## 2020-01-22 ENCOUNTER — Encounter (HOSPITAL_COMMUNITY): Payer: Self-pay | Admitting: Oncology

## 2020-01-25 ENCOUNTER — Other Ambulatory Visit: Payer: Self-pay | Admitting: Oncology

## 2020-01-29 ENCOUNTER — Inpatient Hospital Stay (HOSPITAL_BASED_OUTPATIENT_CLINIC_OR_DEPARTMENT_OTHER): Payer: BC Managed Care – PPO | Admitting: Nurse Practitioner

## 2020-01-29 ENCOUNTER — Inpatient Hospital Stay: Payer: BC Managed Care – PPO

## 2020-01-29 ENCOUNTER — Encounter: Payer: Self-pay | Admitting: Nurse Practitioner

## 2020-01-29 ENCOUNTER — Other Ambulatory Visit: Payer: Self-pay

## 2020-01-29 VITALS — BP 125/57 | HR 92 | Temp 97.5°F | Resp 17 | Ht 67.0 in | Wt 161.4 lb

## 2020-01-29 DIAGNOSIS — Z5111 Encounter for antineoplastic chemotherapy: Secondary | ICD-10-CM | POA: Diagnosis not present

## 2020-01-29 DIAGNOSIS — C182 Malignant neoplasm of ascending colon: Secondary | ICD-10-CM

## 2020-01-29 LAB — CBC WITH DIFFERENTIAL (CANCER CENTER ONLY)
Abs Immature Granulocytes: 0.01 10*3/uL (ref 0.00–0.07)
Basophils Absolute: 0 10*3/uL (ref 0.0–0.1)
Basophils Relative: 1 %
Eosinophils Absolute: 0.1 10*3/uL (ref 0.0–0.5)
Eosinophils Relative: 2 %
HCT: 29 % — ABNORMAL LOW (ref 36.0–46.0)
Hemoglobin: 8.9 g/dL — ABNORMAL LOW (ref 12.0–15.0)
Immature Granulocytes: 0 %
Lymphocytes Relative: 32 %
Lymphs Abs: 1.5 10*3/uL (ref 0.7–4.0)
MCH: 25.1 pg — ABNORMAL LOW (ref 26.0–34.0)
MCHC: 30.7 g/dL (ref 30.0–36.0)
MCV: 81.7 fL (ref 80.0–100.0)
Monocytes Absolute: 1.6 10*3/uL — ABNORMAL HIGH (ref 0.1–1.0)
Monocytes Relative: 34 %
Neutro Abs: 1.5 10*3/uL — ABNORMAL LOW (ref 1.7–7.7)
Neutrophils Relative %: 31 %
Platelet Count: 387 10*3/uL (ref 150–400)
RBC: 3.55 MIL/uL — ABNORMAL LOW (ref 3.87–5.11)
RDW: 27.1 % — ABNORMAL HIGH (ref 11.5–15.5)
WBC Count: 4.7 10*3/uL (ref 4.0–10.5)
nRBC: 0 % (ref 0.0–0.2)

## 2020-01-29 LAB — CMP (CANCER CENTER ONLY)
ALT: 10 U/L (ref 0–44)
AST: 22 U/L (ref 15–41)
Albumin: 2.4 g/dL — ABNORMAL LOW (ref 3.5–5.0)
Alkaline Phosphatase: 208 U/L — ABNORMAL HIGH (ref 38–126)
Anion gap: 10 (ref 5–15)
BUN: 14 mg/dL (ref 8–23)
CO2: 23 mmol/L (ref 22–32)
Calcium: 8.5 mg/dL — ABNORMAL LOW (ref 8.9–10.3)
Chloride: 105 mmol/L (ref 98–111)
Creatinine: 0.77 mg/dL (ref 0.44–1.00)
GFR, Est AFR Am: >60 mL/min (ref >60)
GFR, Estimated: >60 mL/min (ref >60)
Glucose, Bld: 96 mg/dL (ref 70–99)
Potassium: 3.9 mmol/L (ref 3.5–5.1)
Sodium: 138 mmol/L (ref 135–145)
Total Bilirubin: 0.6 mg/dL (ref 0.3–1.2)
Total Protein: 6.2 g/dL — ABNORMAL LOW (ref 6.5–8.1)

## 2020-01-29 LAB — CEA (IN HOUSE-CHCC): CEA (CHCC-In House): 11397 ng/mL — ABNORMAL HIGH (ref 0.00–5.00)

## 2020-01-29 MED ORDER — SODIUM CHLORIDE 0.9 % IV SOLN
2000.0000 mg/m2 | INTRAVENOUS | Status: DC
  Administered 2020-01-29: 3900 mg via INTRAVENOUS
  Filled 2020-01-29: qty 78

## 2020-01-29 MED ORDER — FLUOROURACIL CHEMO INJECTION 2.5 GM/50ML
400.0000 mg/m2 | Freq: Once | INTRAVENOUS | Status: AC
  Administered 2020-01-29: 800 mg via INTRAVENOUS
  Filled 2020-01-29: qty 16

## 2020-01-29 MED ORDER — DEXTROSE 5 % IV SOLN
Freq: Once | INTRAVENOUS | Status: AC
  Filled 2020-01-29: qty 250

## 2020-01-29 MED ORDER — LEUCOVORIN CALCIUM INJECTION 350 MG
400.0000 mg/m2 | Freq: Once | INTRAVENOUS | Status: AC
  Administered 2020-01-29: 780 mg via INTRAVENOUS
  Filled 2020-01-29: qty 39

## 2020-01-29 MED ORDER — PALONOSETRON HCL INJECTION 0.25 MG/5ML
0.2500 mg | Freq: Once | INTRAVENOUS | Status: AC
  Administered 2020-01-29: 0.25 mg via INTRAVENOUS

## 2020-01-29 MED ORDER — SODIUM CHLORIDE 0.9 % IV SOLN
10.0000 mg | Freq: Once | INTRAVENOUS | Status: AC
  Administered 2020-01-29: 10 mg via INTRAVENOUS
  Filled 2020-01-29: qty 10

## 2020-01-29 MED ORDER — OXALIPLATIN CHEMO INJECTION 100 MG/20ML
85.0000 mg/m2 | Freq: Once | INTRAVENOUS | Status: AC
  Administered 2020-01-29: 165 mg via INTRAVENOUS
  Filled 2020-01-29: qty 33

## 2020-01-29 MED ORDER — PALONOSETRON HCL INJECTION 0.25 MG/5ML
INTRAVENOUS | Status: AC
  Filled 2020-01-29: qty 5

## 2020-01-29 NOTE — Progress Notes (Addendum)
  [facility] OFFICE PROGRESS NOTE   Diagnosis: Colon cancer  INTERVAL HISTORY:   Stacy Burns returns as scheduled.  She completed cycle 2 FOLFOX 01/15/2020.  She denies nausea/vomiting.  No mouth sores.  No significant diarrhea.  Cold sensitivity lasted about 10 days.  No numbness or tingling today.  No bleeding except with nose blowing.  Stools are "Stetzer".  Appetite varies.  She has lost weight since her last visit.  Objective:  Vital signs in last 24 hours:  Blood pressure (!) 125/57, pulse 92, temperature (!) 97.5 F (36.4 C), temperature source Temporal, resp. rate 17, height '5\' 7"'$  (1.702 m), weight 161 lb 6.4 oz (73.2 kg), SpO2 100 %.    HEENT: No thrush or ulcers. Resp: Lungs clear bilaterally. Cardio: Regular rate and rhythm. GI: Abdomen soft.  Tender at the right mid to low abdomen.  No hepatomegaly. Vascular: No leg edema. Neuro: Vibratory sense mildly decreased over the fingertips per tuning fork exam. Skin: Palms without erythema. Port-A-Cath without erythema.   Lab Results:  Lab Results  Component Value Date   WBC 4.7 01/29/2020   HGB 8.9 (L) 01/29/2020   HCT 29.0 (L) 01/29/2020   MCV 81.7 01/29/2020   PLT 387 01/29/2020   NEUTROABS 1.5 (L) 01/29/2020    Imaging:  No results found.  Medications: I have reviewed the patient's current medications.  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 Cirrhosis. Innumerable liver lesions.  Colonoscopy 12/18/2019-ulcerated partially obstructing large mass in the mid ascending colon. Biopsy invasive adenocarcinoma; preserved expression major MMR proteins  Upper endoscopy 12/18/2019-normal esophagus, stomach, examined duodenum  CEA 12/19/2019-8,923  Biopsy liver lesion 12/23/2019-adenocarcinoma  Foundation 1-K-ras wild-type; tumor mutation burden greater than or equal to 10;  microsatellite stable; BRAF V600E  Cycle 1 FOLFOX 01/01/2020  Cycle 2 FOLFOX 01/15/2020   Cycle 3 FOLFOX 01/29/2020, Udenyca added 2. Anemia secondary to GI blood loss, on oral iron BID  3. Cirrhosis 4. 12/26/2019-hospital admission for right lower extremity DVT and GI bleed, treated with heparin anticoagulation beginning 12/26/2019, converted to Lovenox at discharge 12/29/2019 5. Low-grade fever-likely "tumor"fever  Disposition: Stacy Burns appears stable.  She has completed 2 cycles of FOLFOX.  Plan to proceed with cycle 3 today as scheduled.  We reviewed the CBC from today.  She has mild neutropenia.  She will receive Udenyca on the day of pump discontinuation.  We reviewed potential toxicities associated with Udenyca including bone pain, rash, splenic rupture.  She agrees to proceed.  Foundation 1 results reviewed with her as above.  She will return for lab, follow-up, cycle 4 FOLFOX in 2 weeks.  She will contact the office in the interim with any problems.  We specifically discussed fever, chills, other signs of infection.  Patient seen with Dr. Benay Spice.  [name] ANP/GNP-BC   01/30/2020  4:34 PM This was a shared visit with Ned Card.  Stacy Burns is tolerating the chemotherapy well.  She has mild neutropenia.  She will receive G-CSF with this cycle.  We gust results of the Foundation 1 testing.  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  She will complete cycle 3 FOLFOX today.  We will consider consider switching to [facility] after the first restaging CTs.  We discussed second line treatment with encorafenib and Panitumumab.  Julieanne Manson, MD

## 2020-01-29 NOTE — Patient Instructions (Signed)
[facility] Discharge Instructions for Patients Receiving Chemotherapy  Today you received the following chemotherapy agents: Oxaliplatin, Leucovorin, and Fluorouracil  To help prevent nausea and vomiting after your treatment, we encourage you to take your nausea medication  as prescribed.    If you develop nausea and vomiting that is not controlled by your nausea medication, call the clinic.   BELOW ARE SYMPTOMS THAT SHOULD BE REPORTED IMMEDIATELY:  *FEVER GREATER THAN 100.5 F  *CHILLS WITH OR WITHOUT FEVER  NAUSEA AND VOMITING THAT IS NOT CONTROLLED WITH YOUR NAUSEA MEDICATION  *UNUSUAL SHORTNESS OF BREATH  *UNUSUAL BRUISING OR BLEEDING  TENDERNESS IN MOUTH AND THROAT WITH OR WITHOUT PRESENCE OF ULCERS  *URINARY PROBLEMS  *BOWEL PROBLEMS  UNUSUAL RASH Items with * indicate a potential emergency and should be followed up as soon as possible.  Feel free to call the clinic should you have any questions or concerns. The clinic phone number is [phone].  Please show the CHEMO ALERT CARD at check-in to the Emergency Department and triage nurse.   

## 2020-01-31 ENCOUNTER — Inpatient Hospital Stay: Payer: BC Managed Care – PPO

## 2020-01-31 ENCOUNTER — Other Ambulatory Visit: Payer: Self-pay

## 2020-01-31 VITALS — BP 145/70 | HR 109 | Temp 98.9°F | Resp 18

## 2020-01-31 DIAGNOSIS — Z5111 Encounter for antineoplastic chemotherapy: Secondary | ICD-10-CM | POA: Diagnosis not present

## 2020-01-31 DIAGNOSIS — C182 Malignant neoplasm of ascending colon: Secondary | ICD-10-CM

## 2020-01-31 MED ORDER — PEGFILGRASTIM-CBQV 6 MG/0.6ML ~~LOC~~ SOSY
6.0000 mg | PREFILLED_SYRINGE | Freq: Once | SUBCUTANEOUS | Status: AC
  Administered 2020-01-31: 6 mg via SUBCUTANEOUS

## 2020-01-31 MED ORDER — HEPARIN SOD (PORK) LOCK FLUSH 100 UNIT/ML IV SOLN
500.0000 [IU] | Freq: Once | INTRAVENOUS | Status: AC | PRN
  Administered 2020-01-31: 500 [IU]
  Filled 2020-01-31: qty 5

## 2020-01-31 MED ORDER — SODIUM CHLORIDE 0.9% FLUSH
10.0000 mL | INTRAVENOUS | Status: DC | PRN
  Administered 2020-01-31: 10 mL
  Filled 2020-01-31: qty 10

## 2020-02-05 ENCOUNTER — Telehealth: Payer: Self-pay | Admitting: Nurse Practitioner

## 2020-02-05 NOTE — Telephone Encounter (Signed)
Called and left msg about added appts. Mailed printout  

## 2020-02-08 ENCOUNTER — Other Ambulatory Visit: Payer: Self-pay | Admitting: Oncology

## 2020-02-11 ENCOUNTER — Other Ambulatory Visit: Payer: Self-pay | Admitting: Oncology

## 2020-02-12 ENCOUNTER — Inpatient Hospital Stay: Payer: BC Managed Care – PPO

## 2020-02-12 ENCOUNTER — Inpatient Hospital Stay: Payer: BC Managed Care – PPO | Attending: Oncology

## 2020-02-12 ENCOUNTER — Inpatient Hospital Stay: Payer: BC Managed Care – PPO | Admitting: Oncology

## 2020-02-12 ENCOUNTER — Other Ambulatory Visit: Payer: Self-pay

## 2020-02-12 ENCOUNTER — Inpatient Hospital Stay: Payer: BC Managed Care – PPO | Admitting: Nutrition

## 2020-02-12 VITALS — BP 133/61 | HR 85 | Temp 97.5°F | Resp 18 | Ht 67.0 in | Wt 158.1 lb

## 2020-02-12 DIAGNOSIS — Z7901 Long term (current) use of anticoagulants: Secondary | ICD-10-CM | POA: Diagnosis not present

## 2020-02-12 DIAGNOSIS — C182 Malignant neoplasm of ascending colon: Secondary | ICD-10-CM

## 2020-02-12 DIAGNOSIS — K7460 Unspecified cirrhosis of liver: Secondary | ICD-10-CM | POA: Diagnosis not present

## 2020-02-12 DIAGNOSIS — Z95828 Presence of other vascular implants and grafts: Secondary | ICD-10-CM

## 2020-02-12 DIAGNOSIS — R509 Fever, unspecified: Secondary | ICD-10-CM | POA: Insufficient documentation

## 2020-02-12 DIAGNOSIS — Z86718 Personal history of other venous thrombosis and embolism: Secondary | ICD-10-CM | POA: Diagnosis not present

## 2020-02-12 DIAGNOSIS — D500 Iron deficiency anemia secondary to blood loss (chronic): Secondary | ICD-10-CM | POA: Diagnosis not present

## 2020-02-12 DIAGNOSIS — Z5111 Encounter for antineoplastic chemotherapy: Secondary | ICD-10-CM | POA: Insufficient documentation

## 2020-02-12 DIAGNOSIS — Z452 Encounter for adjustment and management of vascular access device: Secondary | ICD-10-CM | POA: Diagnosis not present

## 2020-02-12 DIAGNOSIS — D709 Neutropenia, unspecified: Secondary | ICD-10-CM | POA: Insufficient documentation

## 2020-02-12 DIAGNOSIS — C787 Secondary malignant neoplasm of liver and intrahepatic bile duct: Secondary | ICD-10-CM | POA: Insufficient documentation

## 2020-02-12 LAB — CBC WITH DIFFERENTIAL (CANCER CENTER ONLY)
Abs Immature Granulocytes: 1.3 10*3/uL — ABNORMAL HIGH (ref 0.00–0.07)
Band Neutrophils: 17 %
Basophils Absolute: 0 10*3/uL (ref 0.0–0.1)
Basophils Relative: 0 %
Eosinophils Absolute: 0 10*3/uL (ref 0.0–0.5)
Eosinophils Relative: 0 %
HCT: 33.1 % — ABNORMAL LOW (ref 36.0–46.0)
Hemoglobin: 10.1 g/dL — ABNORMAL LOW (ref 12.0–15.0)
Lymphocytes Relative: 11 %
Lymphs Abs: 2.4 10*3/uL (ref 0.7–4.0)
MCH: 26.2 pg (ref 26.0–34.0)
MCHC: 30.5 g/dL (ref 30.0–36.0)
MCV: 86 fL (ref 80.0–100.0)
Metamyelocytes Relative: 5 %
Monocytes Absolute: 1.9 10*3/uL — ABNORMAL HIGH (ref 0.1–1.0)
Monocytes Relative: 9 %
Myelocytes: 1 %
Neutro Abs: 15.9 10*3/uL — ABNORMAL HIGH (ref 1.7–7.7)
Neutrophils Relative %: 57 %
Platelet Count: 193 10*3/uL (ref 150–400)
RBC: 3.85 MIL/uL — ABNORMAL LOW (ref 3.87–5.11)
RDW: 27.7 % — ABNORMAL HIGH (ref 11.5–15.5)
WBC Count: 21.5 10*3/uL — ABNORMAL HIGH (ref 4.0–10.5)
nRBC: 0.4 % — ABNORMAL HIGH (ref 0.0–0.2)

## 2020-02-12 LAB — CEA (IN HOUSE-CHCC): CEA (CHCC-In House): 6577.11 ng/mL — ABNORMAL HIGH (ref 0.00–5.00)

## 2020-02-12 LAB — CMP (CANCER CENTER ONLY)
ALT: 14 U/L (ref 0–44)
AST: 29 U/L (ref 15–41)
Albumin: 2.7 g/dL — ABNORMAL LOW (ref 3.5–5.0)
Alkaline Phosphatase: 264 U/L — ABNORMAL HIGH (ref 38–126)
Anion gap: 13 (ref 5–15)
BUN: 18 mg/dL (ref 8–23)
CO2: 21 mmol/L — ABNORMAL LOW (ref 22–32)
Calcium: 9 mg/dL (ref 8.9–10.3)
Chloride: 105 mmol/L (ref 98–111)
Creatinine: 0.87 mg/dL (ref 0.44–1.00)
GFR, Est AFR Am: >60 mL/min (ref >60)
GFR, Estimated: >60 mL/min (ref >60)
Glucose, Bld: 96 mg/dL (ref 70–99)
Potassium: 3.8 mmol/L (ref 3.5–5.1)
Sodium: 139 mmol/L (ref 135–145)
Total Bilirubin: 0.3 mg/dL (ref 0.3–1.2)
Total Protein: 6.6 g/dL (ref 6.5–8.1)

## 2020-02-12 MED ORDER — ALTEPLASE 2 MG IJ SOLR
INTRAMUSCULAR | Status: AC
  Filled 2020-02-12: qty 2

## 2020-02-12 MED ORDER — SODIUM CHLORIDE 0.9% FLUSH
10.0000 mL | Freq: Once | INTRAVENOUS | Status: AC
  Administered 2020-02-12: 10 mL
  Filled 2020-02-12: qty 10

## 2020-02-12 MED ORDER — FLUOROURACIL CHEMO INJECTION 2.5 GM/50ML
400.0000 mg/m2 | Freq: Once | INTRAVENOUS | Status: AC
  Administered 2020-02-12: 750 mg via INTRAVENOUS
  Filled 2020-02-12: qty 15

## 2020-02-12 MED ORDER — LEUCOVORIN CALCIUM INJECTION 350 MG
424.0000 mg/m2 | Freq: Once | INTRAVENOUS | Status: AC
  Administered 2020-02-12: 780 mg via INTRAVENOUS
  Filled 2020-02-12: qty 39

## 2020-02-12 MED ORDER — SODIUM CHLORIDE 0.9 % IV SOLN
2125.0000 mg/m2 | INTRAVENOUS | Status: DC
  Administered 2020-02-12: 3900 mg via INTRAVENOUS
  Filled 2020-02-12: qty 78

## 2020-02-12 MED ORDER — PALONOSETRON HCL INJECTION 0.25 MG/5ML
0.2500 mg | Freq: Once | INTRAVENOUS | Status: AC
  Administered 2020-02-12: 0.25 mg via INTRAVENOUS

## 2020-02-12 MED ORDER — ALTEPLASE 2 MG IJ SOLR
2.0000 mg | Freq: Once | INTRAMUSCULAR | Status: AC
  Administered 2020-02-12: 2 mg
  Filled 2020-02-12: qty 2

## 2020-02-12 MED ORDER — PALONOSETRON HCL INJECTION 0.25 MG/5ML
INTRAVENOUS | Status: AC
  Filled 2020-02-12: qty 5

## 2020-02-12 MED ORDER — SODIUM CHLORIDE 0.9 % IV SOLN
10.0000 mg | Freq: Once | INTRAVENOUS | Status: AC
  Administered 2020-02-12: 10 mg via INTRAVENOUS
  Filled 2020-02-12: qty 10

## 2020-02-12 MED ORDER — OXALIPLATIN CHEMO INJECTION 100 MG/20ML
89.0000 mg/m2 | Freq: Once | INTRAVENOUS | Status: AC
  Administered 2020-02-12: 165 mg via INTRAVENOUS
  Filled 2020-02-12: qty 33

## 2020-02-12 MED ORDER — DEXTROSE 5 % IV SOLN
Freq: Once | INTRAVENOUS | Status: AC
  Filled 2020-02-12: qty 250

## 2020-02-12 NOTE — Progress Notes (Signed)
[facility] OFFICE PROGRESS NOTE   Diagnosis: Colon cancer  INTERVAL HISTORY:   Stacy Burns completed another cycle of FOLFOX on 01/29/2020.  No mouth sores, nausea, or diarrhea.  Stacy Burns has persistent cold sensitivity.  Stacy Burns had perioral numbness until approximately 2 days ago.  Stacy Burns feels better in general.  [place] Stacy Burns intermittent episodes of rectal bleeding with bowel movements.  Stacy Burns continues Lovenox anticoagulation. Stacy Burns had back pain for 2 to 3 days after receiving Udenyca. Objective:  Vital signs in last 24 hours:  Blood pressure 133/61, pulse 85, temperature (!) 97.5 F (36.4 C), temperature source Temporal, resp. rate 18, height 5' 7" (1.702 m), weight 158 lb 1.6 oz (71.7 kg), SpO2 100 %.    HEENT: No thrush or ulcers Resp: Lungs clear bilaterally, decreased breath sounds at the right lower posterior chest, no respiratory distress Cardio: Regular rate and rhythm GI: The liver edge is palpable in the lateral right upper abdomen Vascular: No leg edema  Skin: Palms without erythema  Portacath/PICC-without erythema  Lab Results:  Lab Results  Component Value Date   WBC 21.5 (H) 02/12/2020   HGB 10.1 (L) 02/12/2020   HCT 33.1 (L) 02/12/2020   MCV 86.0 02/12/2020   PLT 193 02/12/2020   NEUTROABS 15.9 (H) 02/12/2020    CMP  Lab Results  Component Value Date   NA 139 02/12/2020   K 3.8 02/12/2020   CL 105 02/12/2020   CO2 21 (L) 02/12/2020   GLUCOSE 96 02/12/2020   BUN 18 02/12/2020   CREATININE 0.87 02/12/2020   CALCIUM 9.0 02/12/2020   PROT 6.6 02/12/2020   ALBUMIN 2.7 (L) 02/12/2020   AST 29 02/12/2020   ALT 14 02/12/2020   ALKPHOS 264 (H) 02/12/2020   BILITOT 0.3 02/12/2020   GFRNONAA >60 02/12/2020   GFRAA >60 02/12/2020    Lab Results  Component Value Date   CEA1 11,397.00 (H) 01/29/2020     Medications: I have reviewed the patient's current medications.   Assessment/Plan: 1. Colon cancer  metastatic to liver  CT abdomen/pelvis 12/16/2019-focal area of wall thickening and nodularity involving the cecum and ascending colon. Cirrhosis. Innumerable liver lesions.  Colonoscopy 12/18/2019-ulcerated partially obstructing large mass in the mid ascending colon. Biopsy invasive adenocarcinoma; preserved expression major MMR proteins  Upper endoscopy 12/18/2019-normal esophagus, stomach, examined duodenum  CEA 12/19/2019-8,923  Biopsy liver lesion 12/23/2019-adenocarcinoma  Foundation 1-K-ras wild-type; tumor mutation burden greater than or equal to 10; microsatellite stable; BRAF V600E  Cycle 1 FOLFOX 01/01/2020  Cycle 2 FOLFOX 01/15/2020   Cycle 3 FOLFOX 01/29/2020, Udenyca added  Cycle 4 FOLFOX 02/12/2020, Udenyca held 2. Anemia secondary to GI blood loss, on oral iron BID  3. Cirrhosis 4. 12/26/2019-hospital admission for right lower extremity DVT and GI bleed, treated with heparin anticoagulation beginning 12/26/2019, converted to Lovenox at discharge 12/29/2019 5. Low-grade fever-likely "tumor"fever    Disposition: Stacy Burns has completed 3 cycles of FOLFOX.  Her performance status has improved.  The hemoglobin is higher.  Stacy Burns continues Lovenox anticoagulation for treatment of the right lower extremity DVT.  The white count is higher today.  Stacy Burns had bone pain when Stacy Burns received G-CSF with the last cycle of chemotherapy.  We will hold G-CSF support with this cycle.  Stacy Burns will return for an office visit and chemotherapy in 2 weeks.  Stacy Burns will undergo a restaging CT evaluation after cycle 5 FOLFOX.  [name], MD  02/12/2020  11:24 AM

## 2020-02-12 NOTE — Patient Instructions (Addendum)
**  Per Dr. Benay Spice: OK to receive COVID-19 Vaccine** COVID-19 Vaccine Information can be found at: ShippingScam.co.uk For questions related to vaccine distribution or appointments, please email vaccine@ [email or web address] or call [phone].   [facility] Discharge Instructions for Patients Receiving Chemotherapy  Today you received the following chemotherapy agents Oxaliplatin, Leucovorin and Adrucil  To help prevent nausea and vomiting after your treatment, we encourage you to take your nausea medication as directed.    If you develop nausea and vomiting that is not controlled by your nausea medication, call the clinic.   BELOW ARE SYMPTOMS THAT SHOULD BE REPORTED IMMEDIATELY:  *FEVER GREATER THAN 100.5 F  *CHILLS WITH OR WITHOUT FEVER  NAUSEA AND VOMITING THAT IS NOT CONTROLLED WITH YOUR NAUSEA MEDICATION  *UNUSUAL SHORTNESS OF BREATH  *UNUSUAL BRUISING OR BLEEDING  TENDERNESS IN MOUTH AND THROAT WITH OR WITHOUT PRESENCE OF ULCERS  *URINARY PROBLEMS  *BOWEL PROBLEMS  UNUSUAL RASH Items with * indicate a potential emergency and should be followed up as soon as possible.  Feel free to call the clinic should you have any questions or concerns. The clinic phone number is [phone].  Please show the [facility] at check-in to the Emergency Department and triage nurse.

## 2020-02-12 NOTE — Progress Notes (Signed)
Patient was identified to be at risk for malnutrition on the MST secondary to poor appetite and weight loss.  Patient is a 74 year old female diagnosed with metastatic colon cancer who is followed by Dr. Benay Spice.  She is receiving FOLFOX.  Past medical history includes anemia and COVID-19.  Medications include ferrous sulfate, Zofran, Compazine and vitamin B12.  Labs were reviewed.  Height: 5 feet 7 inches. Weight: 158.1 pounds on July 1. Usual body weight: 175 pounds in May 2021 and 205 pounds December 2020. BMI: 24.76.  Estimated energy needs: 2000-2200 cal, 100-120 g protein, 2.2 L fluid.  Patient reports her appetite comes and goes. She endorses weight loss. She reports she has not been able to smell foods for greater than 20 years. She has altered taste, describing food is tasting like cardboard. Reports allergies/intolerance to dairy, pineapple, nuts, and chocolate. Patient has lost 23% of body weight since December 2020 which is significant.  Nutrition diagnosis: Increased nutrient needs related to cancer as evidenced by estimated needs.  Intervention: I reviewed strategies for increasing calories and protein in small frequent meals and snacks. Provided suggestions of appropriate snacks for patient to include throughout the day. Educated patient on strategies to improve taste alterations. Encourage patient to try a different type of nondairy "milk" to supply additional calories and protein. Provided patient with fact sheets.  Questions were answered.  Teach back method used.  Contact information given.  Monitoring, evaluation, goals: Patient will tolerate increased calories and protein to promote weight maintenance.  Next visit: Patient to contact me with questions or concerns.  I will schedule as needed with upcoming treatment.  **Disclaimer: This note was dictated with voice recognition software. Similar sounding words can inadvertently be transcribed and this note may  contain transcription errors which may not have been corrected upon publication of note.** ]

## 2020-02-13 ENCOUNTER — Telehealth: Payer: Self-pay | Admitting: Oncology

## 2020-02-13 NOTE — Telephone Encounter (Signed)
Scheduled appts per 7/1 los. Pt confirmed appt date and time.

## 2020-02-14 ENCOUNTER — Inpatient Hospital Stay: Payer: BC Managed Care – PPO

## 2020-02-14 ENCOUNTER — Other Ambulatory Visit: Payer: Self-pay

## 2020-02-14 VITALS — BP 135/58 | HR 100 | Temp 98.6°F | Resp 18

## 2020-02-14 DIAGNOSIS — C182 Malignant neoplasm of ascending colon: Secondary | ICD-10-CM

## 2020-02-14 DIAGNOSIS — Z5111 Encounter for antineoplastic chemotherapy: Secondary | ICD-10-CM | POA: Diagnosis not present

## 2020-02-14 MED ORDER — HEPARIN SOD (PORK) LOCK FLUSH 100 UNIT/ML IV SOLN
500.0000 [IU] | Freq: Once | INTRAVENOUS | Status: AC | PRN
  Administered 2020-02-14: 500 [IU]
  Filled 2020-02-14: qty 5

## 2020-02-14 MED ORDER — SODIUM CHLORIDE 0.9% FLUSH
10.0000 mL | INTRAVENOUS | Status: DC | PRN
  Administered 2020-02-14: 10 mL
  Filled 2020-02-14: qty 10

## 2020-02-14 NOTE — Patient Instructions (Signed)

## 2020-02-21 ENCOUNTER — Other Ambulatory Visit: Payer: Self-pay | Admitting: Oncology

## 2020-02-26 ENCOUNTER — Encounter: Payer: Self-pay | Admitting: Nurse Practitioner

## 2020-02-26 ENCOUNTER — Inpatient Hospital Stay: Payer: BC Managed Care – PPO

## 2020-02-26 ENCOUNTER — Other Ambulatory Visit: Payer: Self-pay

## 2020-02-26 ENCOUNTER — Inpatient Hospital Stay: Payer: BC Managed Care – PPO | Admitting: Nurse Practitioner

## 2020-02-26 VITALS — BP 118/54 | HR 84 | Temp 97.5°F | Resp 16 | Ht 67.0 in | Wt 154.5 lb

## 2020-02-26 DIAGNOSIS — D649 Anemia, unspecified: Secondary | ICD-10-CM

## 2020-02-26 DIAGNOSIS — Z95828 Presence of other vascular implants and grafts: Secondary | ICD-10-CM

## 2020-02-26 DIAGNOSIS — C182 Malignant neoplasm of ascending colon: Secondary | ICD-10-CM | POA: Diagnosis not present

## 2020-02-26 DIAGNOSIS — Z5111 Encounter for antineoplastic chemotherapy: Secondary | ICD-10-CM | POA: Diagnosis not present

## 2020-02-26 LAB — CMP (CANCER CENTER ONLY)
ALT: 11 U/L (ref 0–44)
AST: 22 U/L (ref 15–41)
Albumin: 2.8 g/dL — ABNORMAL LOW (ref 3.5–5.0)
Alkaline Phosphatase: 142 U/L — ABNORMAL HIGH (ref 38–126)
Anion gap: 10 (ref 5–15)
BUN: 21 mg/dL (ref 8–23)
CO2: 21 mmol/L — ABNORMAL LOW (ref 22–32)
Calcium: 8.8 mg/dL — ABNORMAL LOW (ref 8.9–10.3)
Chloride: 108 mmol/L (ref 98–111)
Creatinine: 0.81 mg/dL (ref 0.44–1.00)
GFR, Est AFR Am: >60 mL/min (ref >60)
GFR, Estimated: >60 mL/min (ref >60)
Glucose, Bld: 96 mg/dL (ref 70–99)
Potassium: 4 mmol/L (ref 3.5–5.1)
Sodium: 139 mmol/L (ref 135–145)
Total Bilirubin: 0.4 mg/dL (ref 0.3–1.2)
Total Protein: 6.3 g/dL — ABNORMAL LOW (ref 6.5–8.1)

## 2020-02-26 LAB — CBC WITH DIFFERENTIAL (CANCER CENTER ONLY)
Abs Immature Granulocytes: 0.01 10*3/uL (ref 0.00–0.07)
Basophils Absolute: 0 10*3/uL (ref 0.0–0.1)
Basophils Relative: 1 %
Eosinophils Absolute: 0.1 10*3/uL (ref 0.0–0.5)
Eosinophils Relative: 2 %
HCT: 27.2 % — ABNORMAL LOW (ref 36.0–46.0)
Hemoglobin: 8.2 g/dL — ABNORMAL LOW (ref 12.0–15.0)
Immature Granulocytes: 0 %
Lymphocytes Relative: 21 %
Lymphs Abs: 0.7 10*3/uL (ref 0.7–4.0)
MCH: 26.2 pg (ref 26.0–34.0)
MCHC: 30.1 g/dL (ref 30.0–36.0)
MCV: 86.9 fL (ref 80.0–100.0)
Monocytes Absolute: 1 10*3/uL (ref 0.1–1.0)
Monocytes Relative: 29 %
Neutro Abs: 1.6 10*3/uL — ABNORMAL LOW (ref 1.7–7.7)
Neutrophils Relative %: 47 %
Platelet Count: 233 10*3/uL (ref 150–400)
RBC: 3.13 MIL/uL — ABNORMAL LOW (ref 3.87–5.11)
RDW: 25 % — ABNORMAL HIGH (ref 11.5–15.5)
WBC Count: 3.3 10*3/uL — ABNORMAL LOW (ref 4.0–10.5)
nRBC: 0 % (ref 0.0–0.2)

## 2020-02-26 LAB — CEA (IN HOUSE-CHCC): CEA (CHCC-In House): 3991.31 ng/mL — ABNORMAL HIGH (ref 0.00–5.00)

## 2020-02-26 MED ORDER — DEXTROSE 5 % IV SOLN
Freq: Once | INTRAVENOUS | Status: AC
  Filled 2020-02-26: qty 250

## 2020-02-26 MED ORDER — LEUCOVORIN CALCIUM INJECTION 350 MG
400.0000 mg/m2 | Freq: Once | INTRAVENOUS | Status: AC
  Administered 2020-02-26: 736 mg via INTRAVENOUS
  Filled 2020-02-26: qty 36.8

## 2020-02-26 MED ORDER — SODIUM CHLORIDE 0.9 % IV SOLN
10.0000 mg | Freq: Once | INTRAVENOUS | Status: AC
  Administered 2020-02-26: 10 mg via INTRAVENOUS
  Filled 2020-02-26: qty 10

## 2020-02-26 MED ORDER — SODIUM CHLORIDE 0.9 % IV SOLN
2000.0000 mg/m2 | INTRAVENOUS | Status: DC
  Administered 2020-02-26: 3700 mg via INTRAVENOUS
  Filled 2020-02-26: qty 74

## 2020-02-26 MED ORDER — PALONOSETRON HCL INJECTION 0.25 MG/5ML
0.2500 mg | Freq: Once | INTRAVENOUS | Status: AC
  Administered 2020-02-26: 0.25 mg via INTRAVENOUS

## 2020-02-26 MED ORDER — OXALIPLATIN CHEMO INJECTION 100 MG/20ML
82.0000 mg/m2 | Freq: Once | INTRAVENOUS | Status: AC
  Administered 2020-02-26: 150 mg via INTRAVENOUS
  Filled 2020-02-26: qty 30

## 2020-02-26 MED ORDER — PALONOSETRON HCL INJECTION 0.25 MG/5ML
INTRAVENOUS | Status: AC
  Filled 2020-02-26: qty 5

## 2020-02-26 MED ORDER — FERROUS SULFATE 325 (65 FE) MG PO TABS: 325.0000 mg | ORAL_TABLET | Freq: Two times a day (BID) | ORAL | 2 refills | Status: DC

## 2020-02-26 MED ORDER — SODIUM CHLORIDE 0.9% FLUSH
10.0000 mL | Freq: Once | INTRAVENOUS | Status: AC
  Administered 2020-02-26: 10 mL
  Filled 2020-02-26: qty 10

## 2020-02-26 MED ORDER — FLUOROURACIL CHEMO INJECTION 2.5 GM/50ML
400.0000 mg/m2 | Freq: Once | INTRAVENOUS | Status: AC
  Administered 2020-02-26: 750 mg via INTRAVENOUS
  Filled 2020-02-26: qty 15

## 2020-02-26 NOTE — Progress Notes (Signed)
  Stacy Burns   Diagnosis: Colon cancer  INTERVAL HISTORY:   Stacy Burns returns as scheduled.  She completed cycle 4 FOLFOX 02/12/2020.  She denies nausea/vomiting.  No mouth sores.  No significant diarrhea.  She denies bleeding.  Stool is light [place] in color.  Cold sensitivity lasted about 10 days.  Abdominal pain continues to be improved.  Objective:  Vital signs in last 24 hours:  Blood pressure (!) 118/54, pulse 84, temperature (!) 97.5 F (36.4 C), temperature source Temporal, resp. rate 16, height 5' 7" (1.702 m), weight 154 lb 8 oz (70.1 kg), SpO2 100 %.    HEENT: No thrush or ulcers. Resp: Lungs clear bilaterally. Cardio: Regular rate and rhythm. GI: Abdomen soft and nontender.  No hepatomegaly. Vascular: No leg edema. Neuro: Vibratory sense minimally decreased over the fingertips per tuning fork exam. Skin: Palms without erythema. Port-A-Cath without erythema.   Lab Results:  Lab Results  Component Value Date   WBC 3.3 (L) 02/26/2020   HGB 8.2 (L) 02/26/2020   HCT 27.2 (L) 02/26/2020   MCV 86.9 02/26/2020   PLT 233 02/26/2020   NEUTROABS 1.6 (L) 02/26/2020    Imaging:  No results found.  Medications: I have reviewed the patient's current medications.  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 Cirrhosis. Innumerable liver lesions.  Colonoscopy 12/18/2019-ulcerated partially obstructing large mass in the mid ascending colon. Biopsy invasive adenocarcinoma; preserved expression major MMR proteins  Upper endoscopy 12/18/2019-normal esophagus, stomach, examined duodenum  CEA 12/19/2019-8,923  Biopsy liver lesion 12/23/2019-adenocarcinoma  Foundation 1-K-ras wild-type; tumor mutation burden greater than or equal to 10; microsatellite stable; BRAF V600E  Cycle 1 FOLFOX 01/01/2020  Cycle 2 FOLFOX  01/15/2020  Cycle 3 FOLFOX 01/29/2020, Udenyca added  Cycle 4 FOLFOX 02/12/2020, Udenyca held  Cycle 5 FOLFOX 02/26/2020, Udenyca 2. 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 Cirrhosis 4. 12/26/2019-hospital admission for right lower extremity DVT and GI bleed, treated with heparin anticoagulation beginning 12/26/2019, converted to Lovenox at discharge 12/29/2019 5. Low-grade fever-likely "tumor"fever  Disposition: Stacy Burns appears stable.  She has completed 4 cycles of FOLFOX.  Since beginning chemotherapy she has improved clinically.  The CEA from 2 weeks ago was better.  Plan to proceed with cycle 5 FOLFOX today as scheduled.  Restaging CTs prior to her next visit in 2 weeks.  We reviewed the CBC from today.  Counts adequate to proceed as above.  She has mild neutropenia.  She will receive Udenyca on the day of pump discontinuation.  She will contact the office with fever, chills, other signs of infection.  Hemoglobin is lower than 2 weeks ago.  She is asymptomatic.  She will return for a follow-up CBC in 1 week.  If she develops signs/symptoms suggestive of progressive anemia she will contact the office for a sooner lab appointment.  She will return for lab, follow-up, FOLFOX in 2 weeks.  She will contact the office in the interim as outlined above or with any other problems.    Ned Card ANP/GNP-BC   02/26/2020  10:27 AM

## 2020-02-26 NOTE — Patient Instructions (Addendum)
[facility] Discharge Instructions for Patients Receiving Chemotherapy  Today you received the following chemotherapy agents: Oxaliplatin, Leucovorin, and Fluorouracil  To help prevent nausea and vomiting after your treatment, we encourage you to take your nausea medication  as prescribed.    If you develop nausea and vomiting that is not controlled by your nausea medication, call the clinic.   BELOW ARE SYMPTOMS THAT SHOULD BE REPORTED IMMEDIATELY:  *FEVER GREATER THAN 100.5 F  *CHILLS WITH OR WITHOUT FEVER  NAUSEA AND VOMITING THAT IS NOT CONTROLLED WITH YOUR NAUSEA MEDICATION  *UNUSUAL SHORTNESS OF BREATH  *UNUSUAL BRUISING OR BLEEDING  TENDERNESS IN MOUTH AND THROAT WITH OR WITHOUT PRESENCE OF ULCERS  *URINARY PROBLEMS  *BOWEL PROBLEMS  UNUSUAL RASH Items with * indicate a potential emergency and should be followed up as soon as possible.  Feel free to call the clinic should you have any questions or concerns. The clinic phone number is [phone].  Please show the CHEMO ALERT CARD at check-in to the Emergency Department and triage nurse.   

## 2020-02-27 ENCOUNTER — Telehealth: Payer: Self-pay | Admitting: Nurse Practitioner

## 2020-02-27 ENCOUNTER — Telehealth: Payer: Self-pay | Admitting: Hematology and Oncology

## 2020-02-27 NOTE — Telephone Encounter (Signed)
Scheduled per 7/15 los. Added all new appts to existing appts will give pt updated calendar

## 2020-02-28 ENCOUNTER — Other Ambulatory Visit: Payer: Self-pay

## 2020-02-28 ENCOUNTER — Inpatient Hospital Stay: Payer: BC Managed Care – PPO

## 2020-02-28 VITALS — BP 107/50 | HR 88 | Temp 97.9°F | Resp 18

## 2020-02-28 DIAGNOSIS — Z5111 Encounter for antineoplastic chemotherapy: Secondary | ICD-10-CM | POA: Diagnosis not present

## 2020-02-28 DIAGNOSIS — C182 Malignant neoplasm of ascending colon: Secondary | ICD-10-CM

## 2020-02-28 DIAGNOSIS — Z95828 Presence of other vascular implants and grafts: Secondary | ICD-10-CM

## 2020-02-28 MED ORDER — HEPARIN SOD (PORK) LOCK FLUSH 100 UNIT/ML IV SOLN
250.0000 [IU] | Freq: Once | INTRAVENOUS | Status: AC
  Administered 2020-02-28: 500 [IU]
  Filled 2020-02-28: qty 5

## 2020-02-28 MED ORDER — SODIUM CHLORIDE 0.9% FLUSH
10.0000 mL | Freq: Once | INTRAVENOUS | Status: AC
  Administered 2020-02-28: 10 mL
  Filled 2020-02-28: qty 10

## 2020-02-28 NOTE — Patient Instructions (Signed)
Implanted Port Home Guide An implanted port is a device that is placed under the skin. It is usually placed in the chest. The device can be used to give IV medicine, to take blood, or for dialysis. You may have an implanted port if:  You need IV medicine that would be irritating to the small veins in your hands or arms.  You need IV medicines, such as antibiotics, for a long period of time.  You need IV nutrition for a long period of time.  You need dialysis. Having a port means that your health care provider will not need to use the veins in your arms for these procedures. You may have fewer limitations when using a port than you would if you used other types of long-term IVs, and you will likely be able to return to normal activities after your incision heals. An implanted port has two main parts:  Reservoir. The reservoir is the part where a needle is inserted to give medicines or draw blood. The reservoir is round. After it is placed, it appears as a small, raised area under your skin.  Catheter. The catheter is a thin, flexible tube that connects the reservoir to a vein. Medicine that is inserted into the reservoir goes into the catheter and then into the vein. How is my port accessed? To access your port:  A numbing cream may be placed on the skin over the port site.  Your health care provider will put on a mask and sterile gloves.  The skin over your port will be cleaned carefully with a germ-killing soap and allowed to dry.  Your health care provider will gently pinch the port and insert a needle into it.  Your health care provider will check for a blood return to make sure the port is in the vein and is not clogged.  If your port needs to remain accessed to get medicine continuously (constant infusion), your health care provider will place a clear bandage (dressing) over the needle site. The dressing and needle will need to be changed every week, or as told by your health care  provider. What is flushing? Flushing helps keep the port from getting clogged. Follow instructions from your health care provider about how and when to flush the port. Ports are usually flushed with saline solution or a medicine called heparin. The need for flushing will depend on how the port is used:  If the port is only used from time to time to give medicines or draw blood, the port may need to be flushed: ? Before and after medicines have been given. ? Before and after blood has been drawn. ? As part of routine maintenance. Flushing may be recommended every 4-6 weeks.  If a constant infusion is running, the port may not need to be flushed.  Throw away any syringes in a disposal container that is meant for sharp items (sharps container). You can buy a sharps container from a pharmacy, or you can make one by using an empty hard plastic bottle with a cover. How long will my port stay implanted? The port can stay in for as long as your health care provider thinks it is needed. When it is time for the port to come out, a surgery will be done to remove it. The surgery will be similar to the procedure that was done to put the port in. Follow these instructions at home:   Flush your port as told by your health care provider.    If you need an infusion over several days, follow instructions from your health care provider about how to take care of your port site. Make sure you: ? Wash your hands with soap and water before you change your dressing. If soap and water are not available, use alcohol-based hand sanitizer. ? Change your dressing as told by your health care provider. ? Place any used dressings or infusion bags into a plastic bag. Throw that bag in the trash. ? Keep the dressing that covers the needle clean and dry. Do not get it wet. ? Do not use scissors or sharp objects near the tube. ? Keep the tube clamped, unless it is being used.  Check your port site every day for signs of  infection. Check for: ? Redness, swelling, or pain. ? Fluid or blood. ? Pus or a bad smell.  Protect the skin around the port site. ? Avoid wearing bra straps that rub or irritate the site. ? Protect the skin around your port from seat belts. Place a soft pad over your chest if needed.  Bathe or shower as told by your health care provider. The site may get wet as long as you are not actively receiving an infusion.  Return to your normal activities as told by your health care provider. Ask your health care provider what activities are safe for you.  Carry a medical alert card or wear a medical alert bracelet at all times. This will let health care providers know that you have an implanted port in case of an emergency. Get help right away if:  You have redness, swelling, or pain at the port site.  You have fluid or blood coming from your port site.  You have pus or a bad smell coming from the port site.  You have a fever. Summary  Implanted ports are usually placed in the chest for long-term IV access.  Follow instructions from your health care provider about flushing the port and changing bandages (dressings).  Take care of the area around your port by avoiding clothing that puts pressure on the area, and by watching for signs of infection.  Protect the skin around your port from seat belts. Place a soft pad over your chest if needed.  Get help right away if you have a fever or you have redness, swelling, pain, drainage, or a bad smell at the port site. This information is not intended to replace advice given to you by your health care provider. Make sure you discuss any questions you have with your health care provider. Document Revised: 11/22/2018 Document Reviewed: 09/02/2016 Elsevier Patient Education  2020 Elsevier [employer]. Pegfilgrastim injection What is this medicine? PEGFILGRASTIM (PEG fil gra stim) is a long-acting granulocyte colony-stimulating factor that stimulates the  growth of neutrophils, a type of white blood cell important in the body's fight against infection. It is used to reduce the incidence of fever and infection in patients with certain types of cancer who are receiving chemotherapy that affects the bone marrow, and to increase survival after being exposed to high doses of radiation. This medicine may be used for other purposes; ask your health care provider or pharmacist if you have questions. COMMON BRAND NAME(S): Fulphila, Neulasta, UDENYCA, Ziextenzo What should I tell my health care provider before I take this medicine? They need to know if you have any of these conditions:  kidney disease  latex allergy  ongoing radiation therapy  sickle cell disease  skin reactions to acrylic adhesives (On-Body   Injector only)  an unusual or allergic reaction to pegfilgrastim, filgrastim, other medicines, foods, dyes, or preservatives  pregnant or trying to get pregnant  breast-feeding How should I use this medicine? This medicine is for injection under the skin. If you get this medicine at home, you will be taught how to prepare and give the pre-filled syringe or how to use the On-body Injector. Refer to the patient Instructions for Use for detailed instructions. Use exactly as directed. Tell your healthcare provider immediately if you suspect that the On-body Injector may not have performed as intended or if you suspect the use of the On-body Injector resulted in a missed or partial dose. It is important that you put your used needles and syringes in a special sharps container. Do not put them in a trash can. If you do not have a sharps container, call your pharmacist or healthcare provider to get one. Talk to your pediatrician regarding the use of this medicine in children. While this drug may be prescribed for selected conditions, precautions do apply. Overdosage: If you think you have taken too much of this medicine contact a poison control center or  emergency room at once. NOTE: This medicine is only for you. Do not share this medicine with others. What if I miss a dose? It is important not to miss your dose. Call your doctor or health care professional if you miss your dose. If you miss a dose due to an On-body Injector failure or leakage, a new dose should be administered as soon as possible using a single prefilled syringe for manual use. What may interact with this medicine? Interactions have not been studied. Give your health care provider a list of all the medicines, herbs, non-prescription drugs, or dietary supplements you use. Also tell them if you smoke, drink alcohol, or use illegal drugs. Some items may interact with your medicine. This list may not describe all possible interactions. Give your health care provider a list of all the medicines, herbs, non-prescription drugs, or dietary supplements you use. Also tell them if you smoke, drink alcohol, or use illegal drugs. Some items may interact with your medicine. What should I watch for while using this medicine? You may need blood work done while you are taking this medicine. If you are going to need a MRI, CT scan, or other procedure, tell your doctor that you are using this medicine (On-Body Injector only). What side effects may I notice from receiving this medicine? Side effects that you should report to your doctor or health care professional as soon as possible:  allergic reactions like skin rash, itching or hives, swelling of the face, lips, or tongue  back pain  dizziness  fever  pain, redness, or irritation at site where injected  pinpoint red spots on the skin  red or dark-Figler urine  shortness of breath or breathing problems  stomach or side pain, or pain at the shoulder  swelling  tiredness  trouble passing urine or change in the amount of urine Side effects that usually do not require medical attention (report to your doctor or health care  professional if they continue or are bothersome):  bone pain  muscle pain This list may not describe all possible side effects. Call your doctor for medical advice about side effects. You may report side effects to FDA at 1-800-FDA-1088. Where should I keep my medicine? Keep out of the reach of children. If you are using this medicine at home, you will be instructed   on how to store it. Throw away any unused medicine after the expiration date on the label. NOTE: This sheet is a summary. It may not cover all possible information. If you have questions about this medicine, talk to your doctor, pharmacist, or health care provider.  2020 Elsevier/Gold Standard (2017-11-05 16:57:08)  

## 2020-03-01 NOTE — Addendum Note (Signed)
Addended by: Delane Ginger on: 03/01/2020 09:13 AM   Modules accepted: Orders

## 2020-03-02 ENCOUNTER — Other Ambulatory Visit: Payer: Self-pay

## 2020-03-02 MED ORDER — ENOXAPARIN SODIUM 120 MG/0.8ML ~~LOC~~ SOLN: 120.0000 mg | SUBCUTANEOUS | 1 refills | Status: DC

## 2020-03-02 NOTE — Progress Notes (Signed)
Pt called requested refill lovenox refill complete

## 2020-03-03 ENCOUNTER — Inpatient Hospital Stay: Payer: BC Managed Care – PPO

## 2020-03-03 ENCOUNTER — Other Ambulatory Visit: Payer: Self-pay

## 2020-03-03 DIAGNOSIS — Z5111 Encounter for antineoplastic chemotherapy: Secondary | ICD-10-CM | POA: Diagnosis not present

## 2020-03-03 DIAGNOSIS — C182 Malignant neoplasm of ascending colon: Secondary | ICD-10-CM

## 2020-03-03 DIAGNOSIS — Z95828 Presence of other vascular implants and grafts: Secondary | ICD-10-CM

## 2020-03-03 LAB — CBC WITH DIFFERENTIAL (CANCER CENTER ONLY)
Abs Immature Granulocytes: 0.23 10*3/uL — ABNORMAL HIGH (ref 0.00–0.07)
Basophils Absolute: 0.1 10*3/uL (ref 0.0–0.1)
Basophils Relative: 1 %
Eosinophils Absolute: 0.1 10*3/uL (ref 0.0–0.5)
Eosinophils Relative: 0 %
HCT: 30.1 % — ABNORMAL LOW (ref 36.0–46.0)
Hemoglobin: 9.3 g/dL — ABNORMAL LOW (ref 12.0–15.0)
Immature Granulocytes: 1 %
Lymphocytes Relative: 11 %
Lymphs Abs: 2.1 10*3/uL (ref 0.7–4.0)
MCH: 27.2 pg (ref 26.0–34.0)
MCHC: 30.9 g/dL (ref 30.0–36.0)
MCV: 88 fL (ref 80.0–100.0)
Monocytes Absolute: 1.7 10*3/uL — ABNORMAL HIGH (ref 0.1–1.0)
Monocytes Relative: 9 %
Neutro Abs: 14.3 10*3/uL — ABNORMAL HIGH (ref 1.7–7.7)
Neutrophils Relative %: 78 %
Platelet Count: 352 10*3/uL (ref 150–400)
RBC: 3.42 MIL/uL — ABNORMAL LOW (ref 3.87–5.11)
RDW: 24.1 % — ABNORMAL HIGH (ref 11.5–15.5)
WBC Count: 18.4 10*3/uL — ABNORMAL HIGH (ref 4.0–10.5)
nRBC: 0 % (ref 0.0–0.2)

## 2020-03-03 LAB — SAMPLE TO BLOOD BANK

## 2020-03-03 MED ORDER — SODIUM CHLORIDE 0.9% FLUSH
10.0000 mL | Freq: Once | INTRAVENOUS | Status: AC
  Administered 2020-03-03: 10 mL
  Filled 2020-03-03: qty 10

## 2020-03-03 MED ORDER — HEPARIN SOD (PORK) LOCK FLUSH 100 UNIT/ML IV SOLN
500.0000 [IU] | Freq: Once | INTRAVENOUS | Status: DC
  Filled 2020-03-03: qty 5

## 2020-03-07 ENCOUNTER — Other Ambulatory Visit: Payer: Self-pay | Admitting: Oncology

## 2020-03-09 ENCOUNTER — Ambulatory Visit (HOSPITAL_COMMUNITY): Payer: BC Managed Care – PPO

## 2020-03-11 ENCOUNTER — Inpatient Hospital Stay: Payer: BC Managed Care – PPO

## 2020-03-11 ENCOUNTER — Telehealth: Payer: Self-pay | Admitting: Nurse Practitioner

## 2020-03-11 ENCOUNTER — Other Ambulatory Visit: Payer: Self-pay

## 2020-03-11 ENCOUNTER — Inpatient Hospital Stay: Payer: BC Managed Care – PPO | Admitting: Nurse Practitioner

## 2020-03-11 ENCOUNTER — Inpatient Hospital Stay: Payer: BC Managed Care – PPO | Admitting: Nutrition

## 2020-03-11 ENCOUNTER — Encounter: Payer: Self-pay | Admitting: Nurse Practitioner

## 2020-03-11 VITALS — BP 108/46 | HR 87 | Temp 97.9°F | Resp 17 | Ht 67.0 in | Wt 152.2 lb

## 2020-03-11 DIAGNOSIS — Z5111 Encounter for antineoplastic chemotherapy: Secondary | ICD-10-CM | POA: Diagnosis not present

## 2020-03-11 DIAGNOSIS — C182 Malignant neoplasm of ascending colon: Secondary | ICD-10-CM

## 2020-03-11 DIAGNOSIS — Z95828 Presence of other vascular implants and grafts: Secondary | ICD-10-CM

## 2020-03-11 LAB — CMP (CANCER CENTER ONLY)
ALT: 13 U/L (ref 0–44)
AST: 31 U/L (ref 15–41)
Albumin: 2.8 g/dL — ABNORMAL LOW (ref 3.5–5.0)
Alkaline Phosphatase: 222 U/L — ABNORMAL HIGH (ref 38–126)
Anion gap: 9 (ref 5–15)
BUN: 15 mg/dL (ref 8–23)
CO2: 23 mmol/L (ref 22–32)
Calcium: 9.3 mg/dL (ref 8.9–10.3)
Chloride: 107 mmol/L (ref 98–111)
Creatinine: 0.83 mg/dL (ref 0.44–1.00)
GFR, Est AFR Am: >60 mL/min (ref >60)
GFR, Estimated: >60 mL/min (ref >60)
Glucose, Bld: 85 mg/dL (ref 70–99)
Potassium: 4 mmol/L (ref 3.5–5.1)
Sodium: 139 mmol/L (ref 135–145)
Total Bilirubin: 0.4 mg/dL (ref 0.3–1.2)
Total Protein: 6.2 g/dL — ABNORMAL LOW (ref 6.5–8.1)

## 2020-03-11 LAB — CBC WITH DIFFERENTIAL (CANCER CENTER ONLY)
Abs Immature Granulocytes: 0.5 10*3/uL — ABNORMAL HIGH (ref 0.00–0.07)
Band Neutrophils: 12 %
Basophils Absolute: 0.2 10*3/uL — ABNORMAL HIGH (ref 0.0–0.1)
Basophils Relative: 1 %
Eosinophils Absolute: 0 10*3/uL (ref 0.0–0.5)
Eosinophils Relative: 0 %
HCT: 31.1 % — ABNORMAL LOW (ref 36.0–46.0)
Hemoglobin: 9.5 g/dL — ABNORMAL LOW (ref 12.0–15.0)
Lymphocytes Relative: 14 %
Lymphs Abs: 2.5 10*3/uL (ref 0.7–4.0)
MCH: 27.5 pg (ref 26.0–34.0)
MCHC: 30.5 g/dL (ref 30.0–36.0)
MCV: 89.9 fL (ref 80.0–100.0)
Metamyelocytes Relative: 3 %
Monocytes Absolute: 1.1 10*3/uL — ABNORMAL HIGH (ref 0.1–1.0)
Monocytes Relative: 6 %
Neutro Abs: 13.8 10*3/uL — ABNORMAL HIGH (ref 1.7–7.7)
Neutrophils Relative %: 64 %
Platelet Count: 164 10*3/uL (ref 150–400)
RBC: 3.46 MIL/uL — ABNORMAL LOW (ref 3.87–5.11)
RDW: 25.9 % — ABNORMAL HIGH (ref 11.5–15.5)
WBC Count: 18.1 10*3/uL — ABNORMAL HIGH (ref 4.0–10.5)
nRBC: 0.3 % — ABNORMAL HIGH (ref 0.0–0.2)

## 2020-03-11 LAB — CEA (IN HOUSE-CHCC): CEA (CHCC-In House): 3134.69 ng/mL — ABNORMAL HIGH (ref 0.00–5.00)

## 2020-03-11 MED ORDER — PALONOSETRON HCL INJECTION 0.25 MG/5ML
0.2500 mg | Freq: Once | INTRAVENOUS | Status: AC
  Administered 2020-03-11: 0.25 mg via INTRAVENOUS

## 2020-03-11 MED ORDER — PALONOSETRON HCL INJECTION 0.25 MG/5ML
INTRAVENOUS | Status: AC
  Filled 2020-03-11: qty 5

## 2020-03-11 MED ORDER — OXALIPLATIN CHEMO INJECTION 100 MG/20ML: 85.0000 mg/m2 | Freq: Once | INTRAVENOUS | Status: DC

## 2020-03-11 MED ORDER — HEPARIN SOD (PORK) LOCK FLUSH 100 UNIT/ML IV SOLN
500.0000 [IU] | Freq: Once | INTRAVENOUS | Status: DC | PRN
  Filled 2020-03-11: qty 5

## 2020-03-11 MED ORDER — SODIUM CHLORIDE 0.9% FLUSH
10.0000 mL | Freq: Once | INTRAVENOUS | Status: AC
  Administered 2020-03-11: 10 mL
  Filled 2020-03-11: qty 10

## 2020-03-11 MED ORDER — DEXTROSE 5 % IV SOLN
Freq: Once | INTRAVENOUS | Status: AC
  Filled 2020-03-11: qty 250

## 2020-03-11 MED ORDER — OXALIPLATIN CHEMO INJECTION 100 MG/20ML
82.0000 mg/m2 | Freq: Once | INTRAVENOUS | Status: AC
  Administered 2020-03-11: 150 mg via INTRAVENOUS
  Filled 2020-03-11: qty 10

## 2020-03-11 MED ORDER — LEUCOVORIN CALCIUM INJECTION 350 MG
400.0000 mg/m2 | Freq: Once | INTRAVENOUS | Status: AC
  Administered 2020-03-11: 736 mg via INTRAVENOUS
  Filled 2020-03-11: qty 36.8

## 2020-03-11 MED ORDER — SODIUM CHLORIDE 0.9 % IV SOLN
2000.0000 mg/m2 | INTRAVENOUS | Status: DC
  Administered 2020-03-11: 3700 mg via INTRAVENOUS
  Filled 2020-03-11: qty 74

## 2020-03-11 MED ORDER — SODIUM CHLORIDE 0.9% FLUSH
10.0000 mL | INTRAVENOUS | Status: DC | PRN
  Administered 2020-03-11 (×2): 10 mL
  Filled 2020-03-11: qty 10

## 2020-03-11 MED ORDER — FLUOROURACIL CHEMO INJECTION 2.5 GM/50ML
400.0000 mg/m2 | Freq: Once | INTRAVENOUS | Status: AC
  Administered 2020-03-11: 750 mg via INTRAVENOUS
  Filled 2020-03-11: qty 15

## 2020-03-11 MED ORDER — SODIUM CHLORIDE 0.9 % IV SOLN
10.0000 mg | Freq: Once | INTRAVENOUS | Status: AC
  Administered 2020-03-11: 10 mg via INTRAVENOUS
  Filled 2020-03-11: qty 10

## 2020-03-11 NOTE — Progress Notes (Signed)
Nutrition follow-up completed with patient during infusion for metastatic colon cancer. Weight decreased and documented as 152.2 pounds July 29 down from 158.1 pounds July 1. Patient has lost 13% body weight over 3 months which is significant. Patient reports poor appetite but denies nausea, vomiting, and mouth sores. She is receiving her sixth cycle of FOLFOX today. She does not tolerate oral nutrition supplements. She has tried a variety of alternative snacks secondary to her allergies however has not found much that agrees with her.  Estimated energy needs: 2000-2200 cal, 100-120 g protein, 2.2 L fluid.  Nutrition diagnosis: Increased nutrient needs continue.  Intervention: Encourage patient to continue high-calorie, high-protein foods and small frequent meals and snacks. Stressed importance of adding tolerable carbohydrates and protein foods. Provided patient with additional fact sheets. Patient agrees to try to increase her food intake.  Monitoring, evaluation, goals: Patient will increase oral intake to promote weight maintenance.  Next visit: Thursday, August 26 during infusion.  **Disclaimer: This note was dictated with voice recognition software. Similar sounding words can inadvertently be transcribed and this note may contain transcription errors which may not have been corrected upon publication of note.**

## 2020-03-11 NOTE — Telephone Encounter (Signed)
Scheduled per 7/29 los. Printed avs and calendar for pt.  

## 2020-03-11 NOTE — Patient Instructions (Signed)
[facility] Discharge Instructions for Patients Receiving Chemotherapy  Today you received the following chemotherapy agents: Oxaliplatin, Leucovorin, and Fluorouracil  To help prevent nausea and vomiting after your treatment, we encourage you to take your nausea medication  as prescribed.    If you develop nausea and vomiting that is not controlled by your nausea medication, call the clinic.   BELOW ARE SYMPTOMS THAT SHOULD BE REPORTED IMMEDIATELY:  *FEVER GREATER THAN 100.5 F  *CHILLS WITH OR WITHOUT FEVER  NAUSEA AND VOMITING THAT IS NOT CONTROLLED WITH YOUR NAUSEA MEDICATION  *UNUSUAL SHORTNESS OF BREATH  *UNUSUAL BRUISING OR BLEEDING  TENDERNESS IN MOUTH AND THROAT WITH OR WITHOUT PRESENCE OF ULCERS  *URINARY PROBLEMS  *BOWEL PROBLEMS  UNUSUAL RASH Items with * indicate a potential emergency and should be followed up as soon as possible.  Feel free to call the clinic should you have any questions or concerns. The clinic phone number is [phone].  Please show the CHEMO ALERT CARD at check-in to the Emergency Department and triage nurse.   

## 2020-03-11 NOTE — Progress Notes (Signed)
  [facility] OFFICE PROGRESS NOTE   Diagnosis: Colon cancer  INTERVAL HISTORY:   Ms. Felix returns as scheduled.  She completed cycle 5 FOLFOX 02/26/2020.  She denies nausea/vomiting.  No mouth sores.  No significant diarrhea.  She has very mild persistent cold sensitivity.  No numbness or tingling in the absence of cold exposure.  She describes her appetite as "not so good".  She is no longer having abdominal pain.  She was referred for restaging CT scans after her last appointment.  She was unable to coordinate her schedule to have the CTs completed prior to today's appointment.  Objective:  Vital signs in last 24 hours:  Blood pressure (!) 108/46, pulse 87, temperature 97.9 F (36.6 C), temperature source Temporal, resp. rate 17, height '5\' 7"'$  (1.702 m), weight 152 lb 3.2 oz (69 kg), SpO2 100 %.    HEENT: No thrush or ulcers. Resp: Lungs clear bilaterally. Cardio: Regular rate and rhythm. GI: Abdomen soft and nontender.  No hepatomegaly.  No mass. Vascular: No leg edema. Neuro: Vibratory sense mildly decreased over the fingertips per tuning fork exam. Skin: Palms without erythema. Port-A-Cath without erythema.   Lab Results:  Lab Results  Component Value Date   WBC 18.1 (H) 03/11/2020   HGB 9.5 (L) 03/11/2020   HCT 31.1 (L) 03/11/2020   MCV 89.9 03/11/2020   PLT 164 03/11/2020   NEUTROABS PENDING 03/11/2020    Imaging:  No results found.  Medications: I have reviewed the patient's current medications.  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 Cirrhosis. Innumerable liver lesions.  Colonoscopy 12/18/2019-ulcerated partially obstructing large mass in the mid ascending colon. Biopsy invasive adenocarcinoma; preserved expression major MMR proteins  Upper endoscopy 12/18/2019-normal esophagus, stomach, examined duodenum  CEA  12/19/2019-8,923  Biopsy liver lesion 12/23/2019-adenocarcinoma  Foundation 1-K-ras wild-type; tumor mutation burden greater than or equal to 10; microsatellite stable; BRAF V600E  Cycle 1 FOLFOX 01/01/2020  Cycle 2 FOLFOX 01/15/2020  Cycle 3 FOLFOX 01/29/2020, Udenyca added  Cycle 4 FOLFOX 02/12/2020, Udenyca held  Cycle 5 FOLFOX 02/26/2020, Udenyca  Cycle 6 FOLFOX 03/11/2020, Udenyca held 2. 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 Cirrhosis 4. 12/26/2019-hospital admission for right lower extremity DVT and GI bleed, treated with heparin anticoagulation beginning 12/26/2019, converted to Lovenox at discharge 12/29/2019 5. Low-grade fever-likely "tumor"fever  Disposition: Ms. Holm appears stable.  She has completed 5 cycles of FOLFOX.  She continues to tolerate the chemotherapy well.  She has improved clinically and the CEA is better.  We had originally intended for her to have restaging CT scans prior to today's appointment.  Unfortunately she was unable to coordinate the CT scans with her schedule and has an appointment for the scans to be done on 03/16/2020.  We decided to go ahead with today's treatment, cycle 6 FOLFOX, restaging CT scans as scheduled 03/16/2020.  We reviewed the CBC and chemistry panel from today.  Labs adequate to proceed today as scheduled.  The white count is elevated.  We will hold Udenyca with this cycle.  She will return for lab, follow-up, FOLFOX in 2 weeks.  She will contact the office in the interim with any problems.    Ned Card ANP/GNP-BC   03/11/2020  10:42 AM

## 2020-03-13 ENCOUNTER — Inpatient Hospital Stay: Payer: BC Managed Care – PPO

## 2020-03-13 ENCOUNTER — Ambulatory Visit: Payer: BC Managed Care – PPO

## 2020-03-13 ENCOUNTER — Other Ambulatory Visit: Payer: Self-pay

## 2020-03-13 VITALS — BP 109/71 | HR 98 | Temp 97.9°F | Resp 20

## 2020-03-13 DIAGNOSIS — C182 Malignant neoplasm of ascending colon: Secondary | ICD-10-CM

## 2020-03-13 DIAGNOSIS — Z5111 Encounter for antineoplastic chemotherapy: Secondary | ICD-10-CM | POA: Diagnosis not present

## 2020-03-13 MED ORDER — HEPARIN SOD (PORK) LOCK FLUSH 100 UNIT/ML IV SOLN
500.0000 [IU] | Freq: Once | INTRAVENOUS | Status: AC | PRN
  Administered 2020-03-13: 500 [IU]
  Filled 2020-03-13: qty 5

## 2020-03-13 MED ORDER — SODIUM CHLORIDE 0.9% FLUSH
10.0000 mL | INTRAVENOUS | Status: DC | PRN
  Administered 2020-03-13: 10 mL
  Filled 2020-03-13: qty 10

## 2020-03-13 NOTE — Patient Instructions (Signed)

## 2020-03-16 ENCOUNTER — Other Ambulatory Visit: Payer: Self-pay

## 2020-03-16 ENCOUNTER — Ambulatory Visit (HOSPITAL_COMMUNITY)
Admission: RE | Admit: 2020-03-16 | Discharge: 2020-03-16 | Disposition: A | Discharge status: Home / Self Care | Payer: BC Managed Care – PPO | Source: Ambulatory Visit | Attending: Nurse Practitioner | Admitting: Nurse Practitioner

## 2020-03-16 DIAGNOSIS — C182 Malignant neoplasm of ascending colon: Secondary | ICD-10-CM | POA: Diagnosis not present

## 2020-03-16 MED ORDER — IOHEXOL 300 MG/ML  SOLN
100.0000 mL | Freq: Once | INTRAMUSCULAR | Status: AC | PRN
  Administered 2020-03-16: 100 mL via INTRAVENOUS

## 2020-03-16 MED ORDER — SODIUM CHLORIDE (PF) 0.9 % IJ SOLN
INTRAMUSCULAR | Status: AC
  Filled 2020-03-16: qty 50

## 2020-03-19 ENCOUNTER — Other Ambulatory Visit: Payer: Self-pay | Admitting: Oncology

## 2020-03-19 LAB — SURGICAL PATHOLOGY

## 2020-03-24 MED FILL — Dexamethasone Sodium Phosphate Inj 100 MG/10ML: INTRAMUSCULAR | Qty: 1 | Status: AC

## 2020-03-24 NOTE — Progress Notes (Addendum)
[place]   [phone]) [phone] [phone]) [phone]   Clinic Follow up Note   Patient Care Team: Ferd Hibbs, NP as PCP - General (Nurse Practitioner) Jonnie Finner, RN as Oncology Nurse Navigator Ladell Pier, MD as Consulting Physician (Oncology) Owens Shark, NP as Nurse Practitioner (Hematology and Oncology) 03/25/2020  CHIEF COMPLAINT: Follow-up colon cancer  CURRENT THERAPY: FOLFOX chemotherapy every 2 weeks  INTERVAL HISTORY: Stacy Burns returns for follow-up and treatment as scheduled.  She completed cycle 7 FOLFOX on 03/11/2020. She underwent a restaging CT of the abdomen and pelvis on 03/16/2020.  She feels "down" with low energy from days 3-5 then fatigue gradually improves.  Cold sensitivity last 2 weeks but less since 3 days before next treatment.  No numbness or tingling in the absence of cold exposure.  She is able to function well, grip, not dropping things.  She continues Lovenox injections, has some "bumps" in the abdomen with bruising.  Denies bleeding.  Denies mucositis, fever, chills, cough, chest pain, dyspnea, leg edema, n/v/c/d, pain, or new issues.    MEDICAL HISTORY:  Past Medical History:  Diagnosis Date  . COVID-19 virus infection 08/2019   not hospitalized.    . Severe anemia 12/17/2019    SURGICAL HISTORY: Past Surgical History:  Procedure Laterality Date  . BIOPSY  12/18/2019   Procedure: BIOPSY;  Surgeon: Thornton Park, MD;  Location: [facility];  Service: Gastroenterology;;  . COLONOSCOPY WITH PROPOFOL N/A 12/18/2019   Procedure: COLONOSCOPY WITH PROPOFOL;  Surgeon: Thornton Park, MD;  Location: [facility];  Service: Gastroenterology;  Laterality: N/A;  . ESOPHAGOGASTRODUODENOSCOPY (EGD) WITH PROPOFOL N/A 12/18/2019   Procedure: ESOPHAGOGASTRODUODENOSCOPY (EGD) WITH PROPOFOL;  Surgeon: Thornton Park, MD;  Location: [facility];  Service: Gastroenterology;  Laterality: N/A;  . PORTACATH PLACEMENT N/A  12/19/2019   Procedure: INSERTION PORT-A-CATH WITH ULTRASOUND;  Surgeon: Rolm Bookbinder, MD;  Location: [facility];  Service: General;  Laterality: N/A;  . SUBMUCOSAL TATTOO INJECTION  12/18/2019   Procedure: SUBMUCOSAL TATTOO INJECTION;  Surgeon: Thornton Park, MD;  Location: [facility];  Service: Gastroenterology;;  . TUBAL LIGATION      I have reviewed the social history and family history with the patient and they are unchanged from previous note.  ALLERGIES:  is allergic to milk-related compounds, penicillins, pineapple, chocolate, hydrocodone, other, and sulfa antibiotics.  MEDICATIONS:  Current Outpatient Medications  Medication Sig Dispense Refill  . enoxaparin (LOVENOX) 120 MG/0.8ML injection Inject 0.8 mLs (120 mg total) into the skin daily. Please dispense 30 syringes 30 mL 1  . ferrous sulfate 325 (65 FE) MG tablet Take 1 tablet (325 mg total) by mouth 2 (two) times daily with a meal. 60 tablet 2  . lidocaine-prilocaine (EMLA) cream Apply 1 application topically as needed. 30 g 0  . vitamin B-12 (CYANOCOBALAMIN) 100 MCG tablet Take 100 mcg by mouth daily.    . ondansetron (ZOFRAN) 8 MG tablet Take 1 tablet (8 mg total) by mouth every 8 (eight) hours as needed for nausea or vomiting. (Patient not taking: Reported on 01/29/2020) 30 tablet 0  . oxyCODONE-acetaminophen (PERCOCET/ROXICET) 5-325 MG tablet Take 1 tablet by mouth every 4 (four) hours as needed for severe pain. (Patient not taking: Reported on 01/29/2020) 60 tablet 0  . prochlorperazine (COMPAZINE) 10 MG tablet TAKE 1 TABLET(10 MG) BY MOUTH EVERY 6 HOURS AS NEEDED FOR NAUSEA OR VOMITING (Patient not taking: Reported on 02/12/2020) 60 tablet 1   Current Facility-Administered Medications  Medication Dose Route Frequency  Provider Last Rate Last Admin  . sodium chloride flush (NS) 0.9 % injection 10 mL  10 mL Intravenous PRN Alla Feeling, NP   10 mL at 03/25/20 1141    PHYSICAL EXAMINATION:  Vitals:   03/25/20 1012  BP:  (!) 107/49  Pulse: 77  Resp: 18  Temp: 97.8 F (36.6 C)  SpO2: 100%   Filed Weights   03/25/20 1012  Weight: 152 lb 1.6 oz (69 kg)    GENERAL:alert, no distress and comfortable SKIN: Generalized ecchymosis on the arms, abdomen EYES:  sclera clear NECK: Without mass LUNGS: clear with normal breathing effort HEART: regular rate & rhythm, no lower extremity edema ABDOMEN:abdomen soft, non-tender and normal bowel sounds.  Superficial nodularity and bruising across the abdomen from injections NEURO: alert & oriented x 3 with fluent speech PAC without erythema  LABORATORY DATA:  I have reviewed the data as listed CBC Latest Ref Rng & Units 03/25/2020 03/11/2020 03/03/2020  WBC 4.0 - 10.5 K/uL 2.9(L) 18.1(H) 18.4(H)  Hemoglobin 12.0 - 15.0 g/dL 8.6(L) 9.5(L) 9.3(L)  Hematocrit 36 - 46 % 27.7(L) 31.1(L) 30.1(L)  Platelets 150 - 400 K/uL 181 164 352     CMP Latest Ref Rng & Units 03/25/2020 03/11/2020 02/26/2020  Glucose 70 - 99 mg/dL 98 85 96  BUN 8 - 23 mg/dL _0 Creatinine 0.44 - 1.00 mg/dL 0.78 0.83 0.81  Sodium 135 - 145 mmol/L 138 139 139  Potassium 3.5 - 5.1 mmol/L 4.1 4.0 4.0  Chloride 98 - 111 mmol/L 108 107 108  CO2 22 - 32 mmol/L 21(L) 23 21(L)  Calcium 8.9 - 10.3 mg/dL 8.8(L) 9.3 8.8(L)  Total Protein 6.5 - 8.1 g/dL 5.6(L) 6.2(L) 6.3(L)  Total Bilirubin 0.3 - 1.2 mg/dL 0.4 0.4 0.4  Alkaline Phos 38 - 126 U/L 133(H) 222(H) 142(H)  AST 15 - 41 U/L _1 ALT 0 - 44 U/L _2 RADIOGRAPHIC STUDIES: I have personally reviewed the radiological images as listed and agreed with the findings in the report. No results found.   ASSESSMENT & PLAN: 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 Cirrhosis. Innumerable liver lesions.  Colonoscopy 12/18/2019-ulcerated partially obstructing large mass in the mid ascending colon. Biopsy  invasive adenocarcinoma; preserved expression major MMR proteins  Upper endoscopy 12/18/2019-normal esophagus, stomach, examined duodenum  CEA 12/19/2019-8,923  Biopsy liver lesion 12/23/2019-adenocarcinoma  Foundation 1-K-ras wild-type; tumor mutation burden greater than or equal to 10; microsatellite stable; BRAF V600E  Cycle 1 FOLFOX 01/01/2020  Cycle 2 FOLFOX 01/15/2020  Cycle 3 FOLFOX 01/29/2020, Udenyca added  Cycle 4 FOLFOX 02/12/2020, Udenyca held  Cycle 5 FOLFOX 02/26/2020, Udenyca  Cycle 6 FOLFOX 03/11/2020, Udenyca held  CT AP 03/16/2020 -stable hepatic metastatic disease, stable thickening of the right colon and ill-defined nodular lesion in the adjacent pericolonic fat; stable indeterminate lung nodules at the lung bases  03/25/20 discontinue FOLFOX, change to FOLFIRI/avastin starting 8/19  2. 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 Cirrhosis 4. 12/26/2019-hospital admission for right lower extremity DVT and GI bleed, treated with heparin anticoagulation beginning 12/26/2019, converted to Lovenox at discharge 12/29/2019 5. Low-grade fever-likely "tumor"fever   Disposition: Stacy Burns appears stable.  She completed 6 cycles of FOLFOX.  She tolerates treatment well with fatigue and cold sensitivity.  She is able to recover and function well.  Her  tumor marker has improved.  We reviewed her CT AP today which shows overall stable hepatic metastatic disease, stable thickening of the colon, and stable indeterminate but suspicious pulmonary nodules in the bases concerning for metastatic disease.   The patient was seen with Dr. Benay Spice.  We discussed her BRAF mutation suggests her disease is somewhat resistant to chemotherapy.  We discussed treatment options including continue with FOLFOX, vs add irinotecan and proceed with FOLFIRINOX, vs change to FOLFIRI/Avastin, or change to encorafenib and Panitumumab due to her BRAF mutation.  Dr. Benay Spice does not feel FOLFOX is  adequately controlling her disease.  Due to her age and comorbidities, she would likely not tolerate FOLFIRINOX.    Dr, Benay Spice is recommending to stop FOLFOX and change to FOLFIRI and bevacizumab.  We discussed potential side effects with irinotecan including diarrhea, hair loss, hematologic toxicities as well as potential side effects with bevacizumab including risk of thrombosis, GI perforation, bleeding, hypertension, CNS toxicity, delayed wound healing, and proteinuria.  She is interested and agrees to proceed.  The goal is palliative.  We reviewed the CBC and CMP from today.  ANC 1.3.  We reviewed neutropenic precautions. She will return for lab, FOLFIRI/avastin next week. She will receive Udenyca with pump d/c. She will f/u with cycle 2 in 3 weeks.    Orders Placed This Encounter  Procedures  . CBC with Differential (Cancer Center Only)    Standing Status:   Future    Standing Expiration Date:   03/25/2021  . CMP ([facility] only)    Standing Status:   Future    Standing Expiration Date:   03/25/2021  . Total Protein, Urine dipstick    Standing Status:   Future    Standing Expiration Date:   03/25/2021   All questions were answered. The patient knows to call the clinic with any problems, questions or concerns. No barriers to learning were detected.     Alla Feeling, NP 03/25/20 This was a shared visit with Cira Rue.  We reviewed the restaging CTs with Stacy Burns and her daughter.  The pattern is consistent with overall stable disease.  She has developed oxaliplatin neuropathy.  She has persistent cold sensitivity.  The CEA is lower, but she has completed 6 cycles of oxaliplatin and there is a significant remaining liver tumor burden.  I discussed treatment options with Stacy Burns and her daughter.  I think it would be difficult for her to tolerate FOLFOXIRI.  I recommend a change to FOLFIRI/Avastin.  We reviewed potential toxicities associated with this regimen and she agrees to  proceed.  She will be a candidate for encorafenib/Panitumumab if there is further disease progression.  Julieanne Manson, MD

## 2020-03-25 ENCOUNTER — Inpatient Hospital Stay: Payer: BC Managed Care – PPO

## 2020-03-25 ENCOUNTER — Inpatient Hospital Stay: Payer: BC Managed Care – PPO | Attending: Oncology

## 2020-03-25 ENCOUNTER — Inpatient Hospital Stay: Payer: BC Managed Care – PPO | Admitting: Nurse Practitioner

## 2020-03-25 ENCOUNTER — Encounter: Payer: Self-pay | Admitting: Nurse Practitioner

## 2020-03-25 ENCOUNTER — Other Ambulatory Visit: Payer: Self-pay | Admitting: Oncology

## 2020-03-25 ENCOUNTER — Other Ambulatory Visit: Payer: Self-pay

## 2020-03-25 VITALS — BP 107/49 | HR 77 | Temp 97.8°F | Resp 18 | Ht 67.0 in | Wt 152.1 lb

## 2020-03-25 DIAGNOSIS — Z5112 Encounter for antineoplastic immunotherapy: Secondary | ICD-10-CM | POA: Diagnosis not present

## 2020-03-25 DIAGNOSIS — C182 Malignant neoplasm of ascending colon: Secondary | ICD-10-CM | POA: Diagnosis not present

## 2020-03-25 DIAGNOSIS — D500 Iron deficiency anemia secondary to blood loss (chronic): Secondary | ICD-10-CM | POA: Diagnosis not present

## 2020-03-25 DIAGNOSIS — Z7901 Long term (current) use of anticoagulants: Secondary | ICD-10-CM | POA: Insufficient documentation

## 2020-03-25 DIAGNOSIS — Z452 Encounter for adjustment and management of vascular access device: Secondary | ICD-10-CM | POA: Insufficient documentation

## 2020-03-25 DIAGNOSIS — K7460 Unspecified cirrhosis of liver: Secondary | ICD-10-CM | POA: Insufficient documentation

## 2020-03-25 DIAGNOSIS — Z5189 Encounter for other specified aftercare: Secondary | ICD-10-CM | POA: Diagnosis not present

## 2020-03-25 DIAGNOSIS — Z95828 Presence of other vascular implants and grafts: Secondary | ICD-10-CM | POA: Diagnosis not present

## 2020-03-25 DIAGNOSIS — C787 Secondary malignant neoplasm of liver and intrahepatic bile duct: Secondary | ICD-10-CM | POA: Insufficient documentation

## 2020-03-25 DIAGNOSIS — Z86718 Personal history of other venous thrombosis and embolism: Secondary | ICD-10-CM | POA: Insufficient documentation

## 2020-03-25 DIAGNOSIS — Z5111 Encounter for antineoplastic chemotherapy: Secondary | ICD-10-CM | POA: Insufficient documentation

## 2020-03-25 LAB — CMP (CANCER CENTER ONLY)
ALT: 14 U/L (ref 0–44)
AST: 26 U/L (ref 15–41)
Albumin: 2.6 g/dL — ABNORMAL LOW (ref 3.5–5.0)
Alkaline Phosphatase: 133 U/L — ABNORMAL HIGH (ref 38–126)
Anion gap: 9 (ref 5–15)
BUN: 19 mg/dL (ref 8–23)
CO2: 21 mmol/L — ABNORMAL LOW (ref 22–32)
Calcium: 8.8 mg/dL — ABNORMAL LOW (ref 8.9–10.3)
Chloride: 108 mmol/L (ref 98–111)
Creatinine: 0.78 mg/dL (ref 0.44–1.00)
GFR, Est AFR Am: >60 mL/min (ref >60)
GFR, Estimated: >60 mL/min (ref >60)
Glucose, Bld: 98 mg/dL (ref 70–99)
Potassium: 4.1 mmol/L (ref 3.5–5.1)
Sodium: 138 mmol/L (ref 135–145)
Total Bilirubin: 0.4 mg/dL (ref 0.3–1.2)
Total Protein: 5.6 g/dL — ABNORMAL LOW (ref 6.5–8.1)

## 2020-03-25 LAB — CBC WITH DIFFERENTIAL (CANCER CENTER ONLY)
Abs Immature Granulocytes: 0 10*3/uL (ref 0.00–0.07)
Basophils Absolute: 0 10*3/uL (ref 0.0–0.1)
Basophils Relative: 0 %
Eosinophils Absolute: 0.1 10*3/uL (ref 0.0–0.5)
Eosinophils Relative: 3 %
HCT: 27.7 % — ABNORMAL LOW (ref 36.0–46.0)
Hemoglobin: 8.6 g/dL — ABNORMAL LOW (ref 12.0–15.0)
Immature Granulocytes: 0 %
Lymphocytes Relative: 21 %
Lymphs Abs: 0.6 10*3/uL — ABNORMAL LOW (ref 0.7–4.0)
MCH: 29.4 pg (ref 26.0–34.0)
MCHC: 31 g/dL (ref 30.0–36.0)
MCV: 94.5 fL (ref 80.0–100.0)
Monocytes Absolute: 0.9 10*3/uL (ref 0.1–1.0)
Monocytes Relative: 30 %
Neutro Abs: 1.3 10*3/uL — ABNORMAL LOW (ref 1.7–7.7)
Neutrophils Relative %: 46 %
Platelet Count: 181 10*3/uL (ref 150–400)
RBC: 2.93 MIL/uL — ABNORMAL LOW (ref 3.87–5.11)
RDW: 25.2 % — ABNORMAL HIGH (ref 11.5–15.5)
WBC Count: 2.9 10*3/uL — ABNORMAL LOW (ref 4.0–10.5)
nRBC: 0 % (ref 0.0–0.2)

## 2020-03-25 LAB — CEA (IN HOUSE-CHCC): CEA (CHCC-In House): 1657.65 ng/mL — ABNORMAL HIGH (ref 0.00–5.00)

## 2020-03-25 MED ORDER — SODIUM CHLORIDE 0.9% FLUSH
10.0000 mL | INTRAVENOUS | Status: DC | PRN
  Administered 2020-03-25: 10 mL via INTRAVENOUS
  Filled 2020-03-25: qty 10

## 2020-03-25 MED ORDER — SODIUM CHLORIDE 0.9% FLUSH
10.0000 mL | Freq: Once | INTRAVENOUS | Status: AC
  Administered 2020-03-25: 10 mL
  Filled 2020-03-25: qty 10

## 2020-03-25 MED ORDER — HEPARIN SOD (PORK) LOCK FLUSH 100 UNIT/ML IV SOLN
500.0000 [IU] | Freq: Once | INTRAVENOUS | Status: AC
  Administered 2020-03-25: 500 [IU] via INTRAVENOUS
  Filled 2020-03-25: qty 5

## 2020-03-25 NOTE — Patient Instructions (Signed)
Provided patient handouts on Avastin and Irinotecan and reviewed potential side effects.

## 2020-03-25 NOTE — Patient Instructions (Signed)

## 2020-03-25 NOTE — Progress Notes (Signed)
DISCONTINUE ON PATHWAY REGIMEN - Colorectal     A cycle is every 14 days:     Oxaliplatin      Leucovorin      Fluorouracil      Fluorouracil   **Always confirm dose/schedule in your pharmacy ordering system**  REASON: Other Reason PRIOR TREATMENT: [id number]: mFOLFOX6 q14 Days TREATMENT RESPONSE: Stable Disease (SD)  START OFF PATHWAY REGIMEN - Colorectal   [id number]:FOLFIRI + Bevacizumab (Leucovorin IV D1 + Fluorouracil IV D1/CIV D1,2 + Irinotecan IV D1 + Bevacizumab IV D1) q14 Days:   A cycle is every 14 days:     Bevacizumab-xxxx      Irinotecan      Leucovorin      Fluorouracil      Fluorouracil   **Always confirm dose/schedule in your pharmacy ordering system**  Patient Characteristics: Distant Metastases, Nonsurgical Candidate, BRAF V600 Mutation Positive (KRAS/NRAS Wild-Type), Standard Cytotoxic/Targeted Therapy, Second Line Standard Cytotoxic/Targeted Therapy Tumor Location: Colon Therapeutic Status: Distant Metastases Microsatellite/Mismatch Repair Status: MSS/pMMR BRAF Mutation Status: Mutation Positive KRAS/NRAS Mutation Status: Wild-Type (no mutation) Standard Cytotoxic/Targeted Line of Therapy: Second Line Standard Cytotoxic/Targeted Therapy Intent of Therapy: Non-Curative / Palliative Intent, Discussed with Patient

## 2020-03-26 ENCOUNTER — Other Ambulatory Visit: Payer: BC Managed Care – PPO

## 2020-03-26 ENCOUNTER — Ambulatory Visit: Payer: BC Managed Care – PPO | Admitting: Oncology

## 2020-03-26 ENCOUNTER — Ambulatory Visit: Payer: BC Managed Care – PPO

## 2020-03-27 ENCOUNTER — Ambulatory Visit: Payer: BC Managed Care – PPO

## 2020-03-27 ENCOUNTER — Other Ambulatory Visit: Payer: Self-pay | Admitting: Oncology

## 2020-03-29 ENCOUNTER — Ambulatory Visit: Payer: BC Managed Care – PPO

## 2020-03-29 ENCOUNTER — Telehealth: Payer: Self-pay | Admitting: Nurse Practitioner

## 2020-03-29 NOTE — Telephone Encounter (Signed)
Scheduled per 8/12 los. Pt declined treatment in [place], unable to get pt in infusion week of 8/16 in [facility]. Spoke with LT and is aware. Scheduled pt next tx on 8/24 and pt is aware of appt time and date.

## 2020-03-31 ENCOUNTER — Other Ambulatory Visit: Payer: BC Managed Care – PPO

## 2020-03-31 ENCOUNTER — Ambulatory Visit: Payer: BC Managed Care – PPO

## 2020-04-01 ENCOUNTER — Other Ambulatory Visit: Payer: BC Managed Care – PPO

## 2020-04-01 ENCOUNTER — Ambulatory Visit: Payer: BC Managed Care – PPO | Admitting: Nurse Practitioner

## 2020-04-01 ENCOUNTER — Other Ambulatory Visit: Payer: Self-pay | Admitting: Nurse Practitioner

## 2020-04-01 MED ORDER — PEGFILGRASTIM-CBQV 6 MG/0.6ML ~~LOC~~ SOSY
6.0000 mg | PREFILLED_SYRINGE | Freq: Once | SUBCUTANEOUS | Status: AC
  Filled 2020-04-01: qty 0.6

## 2020-04-01 MED ORDER — PEGFILGRASTIM-CBQV 6 MG/0.6ML ~~LOC~~ SOSY: 6.0000 mg | PREFILLED_SYRINGE | Freq: Once | SUBCUTANEOUS | Status: DC

## 2020-04-01 MED ORDER — PEGFILGRASTIM-CBQV 6 MG/0.6ML ~~LOC~~ SOSY
6.0000 mg | PREFILLED_SYRINGE | Freq: Once | SUBCUTANEOUS | Status: AC
  Administered 2020-02-28: 6 mg via SUBCUTANEOUS

## 2020-04-01 NOTE — Progress Notes (Signed)
The treatment plan was closed so the injection couldn't be released on 7/17 the date it was given. I spoke with Maggie on 7/19 about the issue and make the provider and pharmacy aware that I did give the injection but couldn't document that day.

## 2020-04-01 NOTE — Addendum Note (Signed)
Addended by: Gardiner Rhyme on: 04/01/2020 02:57 PM   Modules accepted: Orders

## 2020-04-06 ENCOUNTER — Inpatient Hospital Stay: Payer: BC Managed Care – PPO

## 2020-04-06 ENCOUNTER — Other Ambulatory Visit: Payer: Self-pay | Admitting: Oncology

## 2020-04-06 ENCOUNTER — Other Ambulatory Visit: Payer: Self-pay | Admitting: *Deleted

## 2020-04-06 ENCOUNTER — Other Ambulatory Visit: Payer: Self-pay

## 2020-04-06 VITALS — BP 124/58 | HR 74 | Temp 98.2°F | Resp 18

## 2020-04-06 DIAGNOSIS — C182 Malignant neoplasm of ascending colon: Secondary | ICD-10-CM

## 2020-04-06 DIAGNOSIS — N390 Urinary tract infection, site not specified: Secondary | ICD-10-CM

## 2020-04-06 DIAGNOSIS — Z5112 Encounter for antineoplastic immunotherapy: Secondary | ICD-10-CM | POA: Diagnosis not present

## 2020-04-06 DIAGNOSIS — Z95828 Presence of other vascular implants and grafts: Secondary | ICD-10-CM

## 2020-04-06 LAB — URINALYSIS, COMPLETE (UACMP) WITH MICROSCOPIC
Glucose, UA: NEGATIVE mg/dL
Hgb urine dipstick: NEGATIVE
Ketones, ur: 5 mg/dL — AB
Nitrite: NEGATIVE
Protein, ur: 100 mg/dL — AB
Specific Gravity, Urine: 1.027 (ref 1.005–1.030)
pH: 5 (ref 5.0–8.0)

## 2020-04-06 LAB — CMP (CANCER CENTER ONLY)
ALT: 18 U/L (ref 0–44)
AST: 34 U/L (ref 15–41)
Albumin: 2.6 g/dL — ABNORMAL LOW (ref 3.5–5.0)
Alkaline Phosphatase: 154 U/L — ABNORMAL HIGH (ref 38–126)
Anion gap: 9 (ref 5–15)
BUN: 14 mg/dL (ref 8–23)
CO2: 24 mmol/L (ref 22–32)
Calcium: 9.2 mg/dL (ref 8.9–10.3)
Chloride: 105 mmol/L (ref 98–111)
Creatinine: 0.86 mg/dL (ref 0.44–1.00)
GFR, Est AFR Am: >60 mL/min (ref >60)
GFR, Estimated: >60 mL/min (ref >60)
Glucose, Bld: 82 mg/dL (ref 70–99)
Potassium: 4.3 mmol/L (ref 3.5–5.1)
Sodium: 138 mmol/L (ref 135–145)
Total Bilirubin: 0.3 mg/dL (ref 0.3–1.2)
Total Protein: 6 g/dL — ABNORMAL LOW (ref 6.5–8.1)

## 2020-04-06 LAB — CBC WITH DIFFERENTIAL (CANCER CENTER ONLY)
Abs Immature Granulocytes: 0.04 10*3/uL (ref 0.00–0.07)
Basophils Absolute: 0.1 10*3/uL (ref 0.0–0.1)
Basophils Relative: 1 %
Eosinophils Absolute: 0.1 10*3/uL (ref 0.0–0.5)
Eosinophils Relative: 1 %
HCT: 32.5 % — ABNORMAL LOW (ref 36.0–46.0)
Hemoglobin: 10.1 g/dL — ABNORMAL LOW (ref 12.0–15.0)
Immature Granulocytes: 1 %
Lymphocytes Relative: 24 %
Lymphs Abs: 1.5 10*3/uL (ref 0.7–4.0)
MCH: 29 pg (ref 26.0–34.0)
MCHC: 31.1 g/dL (ref 30.0–36.0)
MCV: 93.4 fL (ref 80.0–100.0)
Monocytes Absolute: 0.9 10*3/uL (ref 0.1–1.0)
Monocytes Relative: 15 %
Neutro Abs: 3.7 10*3/uL (ref 1.7–7.7)
Neutrophils Relative %: 58 %
Platelet Count: 239 10*3/uL (ref 150–400)
RBC: 3.48 MIL/uL — ABNORMAL LOW (ref 3.87–5.11)
RDW: 22.2 % — ABNORMAL HIGH (ref 11.5–15.5)
WBC Count: 6.3 10*3/uL (ref 4.0–10.5)
nRBC: 0 % (ref 0.0–0.2)

## 2020-04-06 LAB — CEA (IN HOUSE-CHCC): CEA (CHCC-In House): 1636.73 ng/mL — ABNORMAL HIGH (ref 0.00–5.00)

## 2020-04-06 LAB — TOTAL PROTEIN, URINE DIPSTICK: Protein, ur: 100 mg/dL — AB

## 2020-04-06 MED ORDER — SODIUM CHLORIDE 0.9 % IV SOLN
5.0000 mg/kg | Freq: Once | INTRAVENOUS | Status: AC
  Administered 2020-04-06: 350 mg via INTRAVENOUS
  Filled 2020-04-06: qty 14

## 2020-04-06 MED ORDER — SODIUM CHLORIDE 0.9% FLUSH
10.0000 mL | Freq: Once | INTRAVENOUS | Status: AC
  Administered 2020-04-06: 10 mL
  Filled 2020-04-06: qty 10

## 2020-04-06 MED ORDER — SODIUM CHLORIDE 0.9 % IV SOLN
400.0000 mg/m2 | Freq: Once | INTRAVENOUS | Status: AC
  Administered 2020-04-06: 724 mg via INTRAVENOUS
  Filled 2020-04-06: qty 36.2

## 2020-04-06 MED ORDER — ATROPINE SULFATE 0.4 MG/ML IJ SOLN
INTRAMUSCULAR | Status: AC
  Filled 2020-04-06: qty 1

## 2020-04-06 MED ORDER — PALONOSETRON HCL INJECTION 0.25 MG/5ML
0.2500 mg | Freq: Once | INTRAVENOUS | Status: AC
  Administered 2020-04-06: 0.25 mg via INTRAVENOUS

## 2020-04-06 MED ORDER — SODIUM CHLORIDE 0.9 % IV SOLN
10.0000 mg | Freq: Once | INTRAVENOUS | Status: AC
  Administered 2020-04-06: 10 mg via INTRAVENOUS
  Filled 2020-04-06: qty 10

## 2020-04-06 MED ORDER — FLUOROURACIL CHEMO INJECTION 2.5 GM/50ML
400.0000 mg/m2 | Freq: Once | INTRAVENOUS | Status: AC
  Administered 2020-04-06: 700 mg via INTRAVENOUS
  Filled 2020-04-06: qty 14

## 2020-04-06 MED ORDER — PALONOSETRON HCL INJECTION 0.25 MG/5ML
INTRAVENOUS | Status: AC
  Filled 2020-04-06: qty 5

## 2020-04-06 MED ORDER — SODIUM CHLORIDE 0.9 % IV SOLN
2000.0000 mg/m2 | INTRAVENOUS | Status: DC
  Administered 2020-04-06: 3600 mg via INTRAVENOUS
  Filled 2020-04-06: qty 72

## 2020-04-06 MED ORDER — SODIUM CHLORIDE 0.9 % IV SOLN
Freq: Once | INTRAVENOUS | Status: AC
  Filled 2020-04-06: qty 250

## 2020-04-06 MED ORDER — SODIUM CHLORIDE 0.9 % IV SOLN
180.0000 mg/m2 | Freq: Once | INTRAVENOUS | Status: AC
  Administered 2020-04-06: 320 mg via INTRAVENOUS
  Filled 2020-04-06: qty 15

## 2020-04-06 NOTE — Progress Notes (Signed)
Urine protien 100. Okay totreat per Dr. Benay Spice.

## 2020-04-06 NOTE — Patient Instructions (Signed)

## 2020-04-06 NOTE — Progress Notes (Signed)
Patient denies any urinary symptoms. Instructed her to call for frequency or burning. Ordered culture per Dr. Benay Spice and lab notified.

## 2020-04-06 NOTE — Patient Instructions (Signed)
[facility] Discharge Instructions for Patients Receiving Chemotherapy  Today you received the following chemotherapy agents: Oxaliplatin, Leucovorin, and Fluorouracil  To help prevent nausea and vomiting after your treatment, we encourage you to take your nausea medication  as prescribed.    If you develop nausea and vomiting that is not controlled by your nausea medication, call the clinic.   BELOW ARE SYMPTOMS THAT SHOULD BE REPORTED IMMEDIATELY:  *FEVER GREATER THAN 100.5 F  *CHILLS WITH OR WITHOUT FEVER  NAUSEA AND VOMITING THAT IS NOT CONTROLLED WITH YOUR NAUSEA MEDICATION  *UNUSUAL SHORTNESS OF BREATH  *UNUSUAL BRUISING OR BLEEDING  TENDERNESS IN MOUTH AND THROAT WITH OR WITHOUT PRESENCE OF ULCERS  *URINARY PROBLEMS  *BOWEL PROBLEMS  UNUSUAL RASH Items with * indicate a potential emergency and should be followed up as soon as possible.  Feel free to call the clinic should you have any questions or concerns. The clinic phone number is [phone].  Please show the CHEMO ALERT CARD at check-in to the Emergency Department and triage nurse.   

## 2020-04-07 LAB — URINE CULTURE

## 2020-04-08 ENCOUNTER — Inpatient Hospital Stay: Payer: BC Managed Care – PPO | Admitting: Nutrition

## 2020-04-08 ENCOUNTER — Ambulatory Visit: Payer: BC Managed Care – PPO | Admitting: Nurse Practitioner

## 2020-04-08 ENCOUNTER — Inpatient Hospital Stay: Payer: BC Managed Care – PPO

## 2020-04-08 ENCOUNTER — Other Ambulatory Visit: Payer: BC Managed Care – PPO

## 2020-04-08 ENCOUNTER — Ambulatory Visit: Payer: BC Managed Care – PPO

## 2020-04-08 ENCOUNTER — Other Ambulatory Visit: Payer: Self-pay

## 2020-04-08 VITALS — BP 142/74 | HR 64 | Temp 98.6°F | Resp 18

## 2020-04-08 DIAGNOSIS — Z5112 Encounter for antineoplastic immunotherapy: Secondary | ICD-10-CM | POA: Diagnosis not present

## 2020-04-08 DIAGNOSIS — C182 Malignant neoplasm of ascending colon: Secondary | ICD-10-CM

## 2020-04-08 MED ORDER — HEPARIN SOD (PORK) LOCK FLUSH 100 UNIT/ML IV SOLN
500.0000 [IU] | Freq: Once | INTRAVENOUS | Status: AC | PRN
  Administered 2020-04-08: 500 [IU]
  Filled 2020-04-08: qty 5

## 2020-04-08 MED ORDER — PEGFILGRASTIM-CBQV 6 MG/0.6ML ~~LOC~~ SOSY
6.0000 mg | PREFILLED_SYRINGE | Freq: Once | SUBCUTANEOUS | Status: AC
  Administered 2020-04-08: 6 mg via SUBCUTANEOUS

## 2020-04-08 MED ORDER — SODIUM CHLORIDE 0.9% FLUSH
10.0000 mL | INTRAVENOUS | Status: DC | PRN
  Administered 2020-04-08: 10 mL
  Filled 2020-04-08: qty 10

## 2020-04-08 MED ORDER — PEGFILGRASTIM-CBQV 6 MG/0.6ML ~~LOC~~ SOSY
PREFILLED_SYRINGE | SUBCUTANEOUS | Status: AC
  Filled 2020-04-08: qty 0.6

## 2020-04-13 ENCOUNTER — Telehealth: Payer: Self-pay | Admitting: *Deleted

## 2020-04-13 NOTE — Telephone Encounter (Addendum)
Patient left message to inquire as to status of form completion that were sent to [facility] on 03/29/20. Her current STD expires on 04/13/20. [facility] reports they have not received completed forms back yet. Current nurse that completes forms not in office and no record on spread sheet it was received yet. Left message for Mackie Pai, RN to f/u and call patient. Attempted to reach patient x 2 today and recording said "call cannot be completed at this time". Finally able to connect w/patient this afternoon. Will have Mackie Pai, RN call her tomorrow.

## 2020-04-15 ENCOUNTER — Ambulatory Visit: Payer: BC Managed Care – PPO

## 2020-04-15 ENCOUNTER — Other Ambulatory Visit: Payer: BC Managed Care – PPO

## 2020-04-15 ENCOUNTER — Ambulatory Visit: Payer: BC Managed Care – PPO | Admitting: Nurse Practitioner

## 2020-04-18 ENCOUNTER — Other Ambulatory Visit: Payer: Self-pay | Admitting: Oncology

## 2020-04-19 ENCOUNTER — Other Ambulatory Visit: Payer: Self-pay | Admitting: Oncology

## 2020-04-21 ENCOUNTER — Telehealth: Payer: Self-pay | Admitting: Oncology

## 2020-04-21 ENCOUNTER — Other Ambulatory Visit: Payer: Self-pay

## 2020-04-21 ENCOUNTER — Inpatient Hospital Stay: Payer: BC Managed Care – PPO

## 2020-04-21 ENCOUNTER — Inpatient Hospital Stay: Payer: BC Managed Care – PPO | Admitting: Oncology

## 2020-04-21 ENCOUNTER — Inpatient Hospital Stay: Payer: BC Managed Care – PPO | Admitting: Nutrition

## 2020-04-21 ENCOUNTER — Inpatient Hospital Stay: Payer: BC Managed Care – PPO | Attending: Oncology

## 2020-04-21 VITALS — BP 143/64 | HR 66 | Temp 97.9°F | Resp 18 | Ht 67.0 in | Wt 147.5 lb

## 2020-04-21 DIAGNOSIS — C787 Secondary malignant neoplasm of liver and intrahepatic bile duct: Secondary | ICD-10-CM | POA: Insufficient documentation

## 2020-04-21 DIAGNOSIS — Z5189 Encounter for other specified aftercare: Secondary | ICD-10-CM | POA: Diagnosis not present

## 2020-04-21 DIAGNOSIS — Z5111 Encounter for antineoplastic chemotherapy: Secondary | ICD-10-CM | POA: Diagnosis present

## 2020-04-21 DIAGNOSIS — Z23 Encounter for immunization: Secondary | ICD-10-CM | POA: Insufficient documentation

## 2020-04-21 DIAGNOSIS — Z5112 Encounter for antineoplastic immunotherapy: Secondary | ICD-10-CM | POA: Diagnosis not present

## 2020-04-21 DIAGNOSIS — D500 Iron deficiency anemia secondary to blood loss (chronic): Secondary | ICD-10-CM | POA: Diagnosis not present

## 2020-04-21 DIAGNOSIS — C182 Malignant neoplasm of ascending colon: Secondary | ICD-10-CM | POA: Insufficient documentation

## 2020-04-21 DIAGNOSIS — Z95828 Presence of other vascular implants and grafts: Secondary | ICD-10-CM

## 2020-04-21 DIAGNOSIS — K7460 Unspecified cirrhosis of liver: Secondary | ICD-10-CM | POA: Insufficient documentation

## 2020-04-21 DIAGNOSIS — Z452 Encounter for adjustment and management of vascular access device: Secondary | ICD-10-CM | POA: Diagnosis not present

## 2020-04-21 LAB — CBC WITH DIFFERENTIAL (CANCER CENTER ONLY)
Abs Immature Granulocytes: 0.36 10*3/uL — ABNORMAL HIGH (ref 0.00–0.07)
Basophils Absolute: 0 10*3/uL (ref 0.0–0.1)
Basophils Relative: 1 %
Eosinophils Absolute: 0.2 10*3/uL (ref 0.0–0.5)
Eosinophils Relative: 3 %
HCT: 29.7 % — ABNORMAL LOW (ref 36.0–46.0)
Hemoglobin: 9.3 g/dL — ABNORMAL LOW (ref 12.0–15.0)
Immature Granulocytes: 5 %
Lymphocytes Relative: 18 %
Lymphs Abs: 1.3 10*3/uL (ref 0.7–4.0)
MCH: 30.7 pg (ref 26.0–34.0)
MCHC: 31.3 g/dL (ref 30.0–36.0)
MCV: 98 fL (ref 80.0–100.0)
Monocytes Absolute: 0.7 10*3/uL (ref 0.1–1.0)
Monocytes Relative: 9 %
Neutro Abs: 4.7 10*3/uL (ref 1.7–7.7)
Neutrophils Relative %: 64 %
Platelet Count: 243 10*3/uL (ref 150–400)
RBC: 3.03 MIL/uL — ABNORMAL LOW (ref 3.87–5.11)
RDW: 21.2 % — ABNORMAL HIGH (ref 11.5–15.5)
WBC Count: 7.3 10*3/uL (ref 4.0–10.5)
nRBC: 0.3 % — ABNORMAL HIGH (ref 0.0–0.2)

## 2020-04-21 LAB — CMP (CANCER CENTER ONLY)
ALT: 27 U/L (ref 0–44)
AST: 35 U/L (ref 15–41)
Albumin: 2.8 g/dL — ABNORMAL LOW (ref 3.5–5.0)
Alkaline Phosphatase: 181 U/L — ABNORMAL HIGH (ref 38–126)
Anion gap: 6 (ref 5–15)
BUN: 14 mg/dL (ref 8–23)
CO2: 25 mmol/L (ref 22–32)
Calcium: 8.9 mg/dL (ref 8.9–10.3)
Chloride: 109 mmol/L (ref 98–111)
Creatinine: 0.74 mg/dL (ref 0.44–1.00)
GFR, Est AFR Am: >60 mL/min (ref >60)
GFR, Estimated: >60 mL/min (ref >60)
Glucose, Bld: 77 mg/dL (ref 70–99)
Potassium: 4 mmol/L (ref 3.5–5.1)
Sodium: 140 mmol/L (ref 135–145)
Total Bilirubin: 0.3 mg/dL (ref 0.3–1.2)
Total Protein: 6.1 g/dL — ABNORMAL LOW (ref 6.5–8.1)

## 2020-04-21 MED ORDER — ATROPINE SULFATE 1 MG/ML IJ SOLN
0.5000 mg | Freq: Once | INTRAMUSCULAR | Status: AC | PRN
  Administered 2020-04-21: 0.5 mg via INTRAVENOUS

## 2020-04-21 MED ORDER — SODIUM CHLORIDE 0.9 % IV SOLN
180.0000 mg/m2 | Freq: Once | INTRAVENOUS | Status: AC
  Administered 2020-04-21: 320 mg via INTRAVENOUS
  Filled 2020-04-21: qty 15

## 2020-04-21 MED ORDER — SODIUM CHLORIDE 0.9 % IV SOLN
5.0000 mg/kg | Freq: Once | INTRAVENOUS | Status: AC
  Administered 2020-04-21: 350 mg via INTRAVENOUS
  Filled 2020-04-21: qty 14

## 2020-04-21 MED ORDER — SODIUM CHLORIDE 0.9 % IV SOLN
2000.0000 mg/m2 | INTRAVENOUS | Status: DC
  Administered 2020-04-21: 3600 mg via INTRAVENOUS
  Filled 2020-04-21: qty 72

## 2020-04-21 MED ORDER — SODIUM CHLORIDE 0.9 % IV SOLN
400.0000 mg/m2 | Freq: Once | INTRAVENOUS | Status: AC
  Administered 2020-04-21: 724 mg via INTRAVENOUS
  Filled 2020-04-21: qty 36.2

## 2020-04-21 MED ORDER — SODIUM CHLORIDE 0.9% FLUSH
10.0000 mL | Freq: Once | INTRAVENOUS | Status: AC
  Administered 2020-04-21: 10 mL
  Filled 2020-04-21: qty 10

## 2020-04-21 MED ORDER — FLUOROURACIL CHEMO INJECTION 2.5 GM/50ML
400.0000 mg/m2 | Freq: Once | INTRAVENOUS | Status: AC
  Administered 2020-04-21: 700 mg via INTRAVENOUS
  Filled 2020-04-21: qty 14

## 2020-04-21 MED ORDER — ATROPINE SULFATE 1 MG/ML IJ SOLN
INTRAMUSCULAR | Status: AC
  Filled 2020-04-21: qty 1

## 2020-04-21 MED ORDER — SODIUM CHLORIDE 0.9 % IV SOLN
10.0000 mg | Freq: Once | INTRAVENOUS | Status: AC
  Administered 2020-04-21: 10 mg via INTRAVENOUS
  Filled 2020-04-21: qty 10

## 2020-04-21 MED ORDER — SODIUM CHLORIDE 0.9 % IV SOLN
Freq: Once | INTRAVENOUS | Status: AC
  Filled 2020-04-21: qty 250

## 2020-04-21 MED ORDER — PALONOSETRON HCL INJECTION 0.25 MG/5ML
INTRAVENOUS | Status: AC
  Filled 2020-04-21: qty 5

## 2020-04-21 MED ORDER — PALONOSETRON HCL INJECTION 0.25 MG/5ML
0.2500 mg | Freq: Once | INTRAVENOUS | Status: AC
  Administered 2020-04-21: 0.25 mg via INTRAVENOUS

## 2020-04-21 NOTE — Patient Instructions (Addendum)
[facility] Discharge Instructions for Patients Receiving Chemotherapy  Today you received the following chemotherapy agents Bevacizumab-bvzr (ZIRABEV), Irinotecan (CAMPTOSAR), Leucovorin & Flourouracil (ADRUCIL).  To help prevent nausea and vomiting after your treatment, we encourage you to take your nausea medication as prescribed.   If you develop nausea and vomiting that is not controlled by your nausea medication, call the clinic.   BELOW ARE SYMPTOMS THAT SHOULD BE REPORTED IMMEDIATELY:  *FEVER GREATER THAN 100.5 F  *CHILLS WITH OR WITHOUT FEVER  NAUSEA AND VOMITING THAT IS NOT CONTROLLED WITH YOUR NAUSEA MEDICATION  *UNUSUAL SHORTNESS OF BREATH  *UNUSUAL BRUISING OR BLEEDING  TENDERNESS IN MOUTH AND THROAT WITH OR WITHOUT PRESENCE OF ULCERS  *URINARY PROBLEMS  *BOWEL PROBLEMS  UNUSUAL RASH Items with * indicate a potential emergency and should be followed up as soon as possible.  Feel free to call the clinic should you have any questions or concerns. The clinic phone number is [phone].  Please show the CHEMO ALERT CARD at check-in to the Emergency Department and triage nurse.   

## 2020-04-21 NOTE — Progress Notes (Signed)
   Covid-19 Vaccination Clinic  Name:  [name]    MRN: [id number] DOB: October 29, 1945  04/21/2020  Stacy Burns was observed post Covid-19 immunization for 15 minutes without incident. She was provided with Vaccine Information Sheet and instruction to access the V-Safe system.   Stacy Burns was instructed to call 911 with any severe reactions post vaccine: Marland Kitchen Difficulty breathing  . Swelling of face and throat  . A fast heartbeat  . A bad rash all over body  . Dizziness and weakness

## 2020-04-21 NOTE — Progress Notes (Signed)
  [place] OFFICE PROGRESS NOTE   Diagnosis: Colon cancer  INTERVAL HISTORY:   Stacy Burns completed cycle one FOLFIRI/Avastin 04/06/2020. No nausea/vomiting or mouth sores. She reports diarrhea 3-4 times per day for 2 days following chemotherapy. She did not take Imodium. She continues Lovenox. She has lack of taste since being diagnosed with Covid infection in January.  Objective:  Vital signs in last 24 hours:  Blood pressure (!) 143/64, pulse 66, temperature 97.9 F (36.6 C), temperature source Tympanic, resp. rate 18, height $RemoveBe'5\' 7"'ViFEDFOCt$  (1.702 m), weight 147 lb 8 oz (66.9 kg), SpO2 99 %.    HEENT: No thrush or ulcers Resp: Lungs clear bilaterally Cardio: Regular rate and rhythm GI: No hepatomegaly, nontender Vascular: No leg edema  Skin: Small ecchymoses over the abdominal wall at Lovenox injection sites  Portacath/PICC-without erythema  Lab Results:  Lab Results  Component Value Date   WBC 7.3 04/21/2020   HGB 9.3 (L) 04/21/2020   HCT 29.7 (L) 04/21/2020   MCV 98.0 04/21/2020   PLT 243 04/21/2020   NEUTROABS 4.7 04/21/2020    CMP  Lab Results  Component Value Date   NA 140 04/21/2020   K 4.0 04/21/2020   CL 109 04/21/2020   CO2 25 04/21/2020   GLUCOSE 77 04/21/2020   BUN 14 04/21/2020   CREATININE 0.74 04/21/2020   CALCIUM 8.9 04/21/2020   PROT 6.1 (L) 04/21/2020   ALBUMIN 2.8 (L) 04/21/2020   AST 35 04/21/2020   ALT 27 04/21/2020   ALKPHOS 181 (H) 04/21/2020   BILITOT 0.3 04/21/2020   GFRNONAA >60 04/21/2020   GFRAA >60 04/21/2020    Lab Results  Component Value Date   CEA1 3,976.73 (H) 04/06/2020     Medications: I have reviewed the patient's current medications.   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 Cirrhosis. Innumerable liver lesions.  Colonoscopy 12/18/2019-ulcerated partially obstructing large  mass in the mid ascending colon. Biopsy invasive adenocarcinoma; preserved expression major MMR proteins  Upper endoscopy 12/18/2019-normal esophagus, stomach, examined duodenum  CEA 12/19/2019-8,923  Biopsy liver lesion 12/23/2019-adenocarcinoma  Foundation 1-K-ras wild-type; tumor mutation burden greater than or equal to 10; microsatellite stable; BRAF V600E  Cycle 1 FOLFOX 01/01/2020  Cycle 2 FOLFOX 01/15/2020  Cycle 3 FOLFOX 01/29/2020, Udenyca added  Cycle 4 FOLFOX 02/12/2020, Udenyca held  Cycle 5 FOLFOX 02/26/2020, Udenyca  Cycle 6 FOLFOX 03/11/2020, Udenyca held  CT abdomen/pelvis 03/16/2020-stable diffuse liver lesions, stable strictured appearing ascending colon, possible pericolonic implant, vague left lower lobe nodule  Cycle 1 FOLFIRI/Avastin 04/06/2020  Cycle 2 FOLFIRI/Avastin 04/21/2020 2. 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 Cirrhosis 4. 12/26/2019-hospital admission for right lower extremity DVT and GI bleed, treated with heparin anticoagulation beginning 12/26/2019, converted to Lovenox at discharge 12/29/2019 5. History of low-grade fever-likely "tumor"fever 6. COVID-19 infection January 2021    Disposition: Stacy Burns appears stable. She tolerated the first cycle of FOLFIRI/Avastin well. She will complete cycle two today. She will return for an office visit and chemotherapy in 2 weeks. Stacy Burns will receive the first dose of the COVID-19 vaccine today. [name], MD  04/21/2020  11:06 AM

## 2020-04-21 NOTE — Telephone Encounter (Signed)
Scheduled appointments per 9/8 los. Gave patient calendar print out.

## 2020-04-21 NOTE — Progress Notes (Signed)
Nutrition follow-up completed with patient during infusion for metastatic colon cancer. Weight decreased and documented as 147.5 pounds September 8 down from 152.1 pounds August 12. Noted labs: Albumin 2.8. Patient denies nausea, vomiting, and mouth sores. Patient cannot taste food or smell secondary to having Covid in the early part of 2021. She reports 3-4 soft stools for 2 days after chemotherapy.  She reports they are not watery just soft.  Noted patient encouraged to take Imodium.  Nutrition diagnosis: Increased nutrient needs continue.  Intervention: Reviewed strategies for improving soft stools directly after chemotherapy.  Encouraged her to consume bananas, applesauce, and white rice. Reviewed strategies for improving taste alterations. Patient was given fact sheets.  Questions were answered.  Teach back method used.  Monitoring, evaluation, goals: Patient will tolerate increased calories and protein to minimize further weight loss.  Next visit: To be scheduled with treatment as needed.  **Disclaimer: This note was dictated with voice recognition software. Similar sounding words can inadvertently be transcribed and this note may contain transcription errors which may not have been corrected upon publication of note.**

## 2020-04-23 ENCOUNTER — Other Ambulatory Visit: Payer: Self-pay

## 2020-04-23 ENCOUNTER — Inpatient Hospital Stay: Payer: BC Managed Care – PPO

## 2020-04-23 VITALS — BP 112/78 | HR 78 | Temp 98.0°F | Resp 18

## 2020-04-23 DIAGNOSIS — C182 Malignant neoplasm of ascending colon: Secondary | ICD-10-CM

## 2020-04-23 DIAGNOSIS — Z5112 Encounter for antineoplastic immunotherapy: Secondary | ICD-10-CM | POA: Diagnosis not present

## 2020-04-23 MED ORDER — PEGFILGRASTIM-CBQV 6 MG/0.6ML ~~LOC~~ SOSY
PREFILLED_SYRINGE | SUBCUTANEOUS | Status: AC
  Filled 2020-04-23: qty 0.6

## 2020-04-23 MED ORDER — HEPARIN SOD (PORK) LOCK FLUSH 100 UNIT/ML IV SOLN
500.0000 [IU] | Freq: Once | INTRAVENOUS | Status: DC | PRN
  Filled 2020-04-23: qty 5

## 2020-04-23 MED ORDER — SODIUM CHLORIDE 0.9% FLUSH
10.0000 mL | INTRAVENOUS | Status: DC | PRN
  Filled 2020-04-23: qty 10

## 2020-04-23 MED ORDER — PEGFILGRASTIM-CBQV 6 MG/0.6ML ~~LOC~~ SOSY
6.0000 mg | PREFILLED_SYRINGE | Freq: Once | SUBCUTANEOUS | Status: AC
  Administered 2020-04-23: 6 mg via SUBCUTANEOUS

## 2020-04-23 NOTE — Patient Instructions (Signed)

## 2020-04-27 ENCOUNTER — Other Ambulatory Visit: Payer: Self-pay | Admitting: Oncology

## 2020-04-29 ENCOUNTER — Other Ambulatory Visit: Payer: Self-pay | Admitting: Oncology

## 2020-05-01 ENCOUNTER — Other Ambulatory Visit: Payer: Self-pay | Admitting: Oncology

## 2020-05-06 ENCOUNTER — Other Ambulatory Visit: Payer: Self-pay

## 2020-05-06 ENCOUNTER — Inpatient Hospital Stay: Payer: BC Managed Care – PPO | Admitting: Oncology

## 2020-05-06 ENCOUNTER — Encounter: Payer: Self-pay | Admitting: *Deleted

## 2020-05-06 ENCOUNTER — Inpatient Hospital Stay: Payer: BC Managed Care – PPO

## 2020-05-06 VITALS — BP 127/66 | HR 70 | Temp 98.4°F | Resp 16 | Wt 147.0 lb

## 2020-05-06 VITALS — BP 124/58

## 2020-05-06 DIAGNOSIS — C182 Malignant neoplasm of ascending colon: Secondary | ICD-10-CM | POA: Diagnosis not present

## 2020-05-06 DIAGNOSIS — Z95828 Presence of other vascular implants and grafts: Secondary | ICD-10-CM

## 2020-05-06 DIAGNOSIS — Z5112 Encounter for antineoplastic immunotherapy: Secondary | ICD-10-CM | POA: Diagnosis not present

## 2020-05-06 LAB — CMP (CANCER CENTER ONLY)
ALT: 25 U/L (ref 0–44)
AST: 31 U/L (ref 15–41)
Albumin: 2.9 g/dL — ABNORMAL LOW (ref 3.5–5.0)
Alkaline Phosphatase: 195 U/L — ABNORMAL HIGH (ref 38–126)
Anion gap: 5 (ref 5–15)
BUN: 17 mg/dL (ref 8–23)
CO2: 27 mmol/L (ref 22–32)
Calcium: 9.1 mg/dL (ref 8.9–10.3)
Chloride: 107 mmol/L (ref 98–111)
Creatinine: 0.73 mg/dL (ref 0.44–1.00)
GFR, Est AFR Am: >60 mL/min (ref >60)
GFR, Estimated: >60 mL/min (ref >60)
Glucose, Bld: 88 mg/dL (ref 70–99)
Potassium: 4 mmol/L (ref 3.5–5.1)
Sodium: 139 mmol/L (ref 135–145)
Total Bilirubin: 0.3 mg/dL (ref 0.3–1.2)
Total Protein: 6.5 g/dL (ref 6.5–8.1)

## 2020-05-06 LAB — CBC WITH DIFFERENTIAL (CANCER CENTER ONLY)
Abs Immature Granulocytes: 0.83 10*3/uL — ABNORMAL HIGH (ref 0.00–0.07)
Basophils Absolute: 0.1 10*3/uL (ref 0.0–0.1)
Basophils Relative: 1 %
Eosinophils Absolute: 0.1 10*3/uL (ref 0.0–0.5)
Eosinophils Relative: 1 %
HCT: 29.4 % — ABNORMAL LOW (ref 36.0–46.0)
Hemoglobin: 9.4 g/dL — ABNORMAL LOW (ref 12.0–15.0)
Immature Granulocytes: 8 %
Lymphocytes Relative: 13 %
Lymphs Abs: 1.3 10*3/uL (ref 0.7–4.0)
MCH: 32.3 pg (ref 26.0–34.0)
MCHC: 32 g/dL (ref 30.0–36.0)
MCV: 101 fL — ABNORMAL HIGH (ref 80.0–100.0)
Monocytes Absolute: 1 10*3/uL (ref 0.1–1.0)
Monocytes Relative: 9 %
Neutro Abs: 6.8 10*3/uL (ref 1.7–7.7)
Neutrophils Relative %: 68 %
Platelet Count: 236 10*3/uL (ref 150–400)
RBC: 2.91 MIL/uL — ABNORMAL LOW (ref 3.87–5.11)
RDW: 20.1 % — ABNORMAL HIGH (ref 11.5–15.5)
WBC Count: 10.1 10*3/uL (ref 4.0–10.5)
nRBC: 0.2 % (ref 0.0–0.2)

## 2020-05-06 LAB — CEA (IN HOUSE-CHCC): CEA (CHCC-In House): 614.11 ng/mL — ABNORMAL HIGH (ref 0.00–5.00)

## 2020-05-06 MED ORDER — SODIUM CHLORIDE 0.9 % IV SOLN
180.0000 mg/m2 | Freq: Once | INTRAVENOUS | Status: AC
  Administered 2020-05-06: 320 mg via INTRAVENOUS
  Filled 2020-05-06: qty 5

## 2020-05-06 MED ORDER — ATROPINE SULFATE 1 MG/ML IJ SOLN
INTRAMUSCULAR | Status: AC
  Filled 2020-05-06: qty 1

## 2020-05-06 MED ORDER — PALONOSETRON HCL INJECTION 0.25 MG/5ML
0.2500 mg | Freq: Once | INTRAVENOUS | Status: AC
  Administered 2020-05-06: 0.25 mg via INTRAVENOUS

## 2020-05-06 MED ORDER — SODIUM CHLORIDE 0.9 % IV SOLN
Freq: Once | INTRAVENOUS | Status: AC
  Filled 2020-05-06: qty 250

## 2020-05-06 MED ORDER — PALONOSETRON HCL INJECTION 0.25 MG/5ML
INTRAVENOUS | Status: AC
  Filled 2020-05-06: qty 5

## 2020-05-06 MED ORDER — SODIUM CHLORIDE 0.9% FLUSH
10.0000 mL | Freq: Once | INTRAVENOUS | Status: AC
  Administered 2020-05-06: 10 mL
  Filled 2020-05-06: qty 10

## 2020-05-06 MED ORDER — FLUOROURACIL CHEMO INJECTION 2.5 GM/50ML
400.0000 mg/m2 | Freq: Once | INTRAVENOUS | Status: AC
  Administered 2020-05-06: 700 mg via INTRAVENOUS
  Filled 2020-05-06: qty 14

## 2020-05-06 MED ORDER — SODIUM CHLORIDE 0.9 % IV SOLN
2000.0000 mg/m2 | INTRAVENOUS | Status: DC
  Administered 2020-05-06: 3600 mg via INTRAVENOUS
  Filled 2020-05-06: qty 72

## 2020-05-06 MED ORDER — SODIUM CHLORIDE 0.9 % IV SOLN
400.0000 mg/m2 | Freq: Once | INTRAVENOUS | Status: AC
  Administered 2020-05-06: 724 mg via INTRAVENOUS
  Filled 2020-05-06: qty 36.2

## 2020-05-06 MED ORDER — SODIUM CHLORIDE 0.9% FLUSH
10.0000 mL | INTRAVENOUS | Status: DC | PRN
  Filled 2020-05-06: qty 10

## 2020-05-06 MED ORDER — HEPARIN SOD (PORK) LOCK FLUSH 100 UNIT/ML IV SOLN
500.0000 [IU] | Freq: Once | INTRAVENOUS | Status: DC | PRN
  Filled 2020-05-06: qty 5

## 2020-05-06 MED ORDER — ATROPINE SULFATE 1 MG/ML IJ SOLN
0.5000 mg | Freq: Once | INTRAMUSCULAR | Status: AC | PRN
  Administered 2020-05-06: 0.5 mg via INTRAVENOUS

## 2020-05-06 MED ORDER — SODIUM CHLORIDE 0.9 % IV SOLN
10.0000 mg | Freq: Once | INTRAVENOUS | Status: AC
  Administered 2020-05-06: 10 mg via INTRAVENOUS
  Filled 2020-05-06: qty 10

## 2020-05-06 MED ORDER — SODIUM CHLORIDE 0.9 % IV SOLN
5.0000 mg/kg | Freq: Once | INTRAVENOUS | Status: AC
  Administered 2020-05-06: 350 mg via INTRAVENOUS
  Filled 2020-05-06: qty 14

## 2020-05-06 NOTE — Progress Notes (Signed)
  [place] OFFICE PROGRESS NOTE   Diagnosis: Colon cancer  INTERVAL HISTORY:   [name] completed another cycle of FOLFIRI/Avastin on 04/21/2020.  She reports diarrhea on day 2 only.  The diarrhea was relieved with Imodium.  She has mild nosebleeding.  No other bleeding.  No neuropathy symptoms. She had a fever, headache, and chills on the day following the COVID-19 vaccine. Objective:  Vital signs in last 24 hours:  Blood pressure 127/66, pulse 70, temperature 98.4 F (36.9 C), temperature source Tympanic, resp. rate 16, weight 147 lb (66.7 kg), SpO2 100 %.    HEENT: No thrush or ulcers Resp: Lungs clear bilaterally Cardio: Regular rate and rhythm GI: No hepatomegaly, nontender Vascular: No leg edema    Portacath/PICC-without erythema  Lab Results:  Lab Results  Component Value Date   WBC 10.1 05/06/2020   HGB 9.4 (L) 05/06/2020   HCT 29.4 (L) 05/06/2020   MCV 101.0 (H) 05/06/2020   PLT 236 05/06/2020   NEUTROABS 6.8 05/06/2020    CMP  Lab Results  Component Value Date   NA 139 05/06/2020   K 4.0 05/06/2020   CL 107 05/06/2020   CO2 27 05/06/2020   GLUCOSE 88 05/06/2020   BUN 17 05/06/2020   CREATININE 0.73 05/06/2020   CALCIUM 9.1 05/06/2020   PROT 6.5 05/06/2020   ALBUMIN 2.9 (L) 05/06/2020   AST 31 05/06/2020   ALT 25 05/06/2020   ALKPHOS 195 (H) 05/06/2020   BILITOT 0.3 05/06/2020   GFRNONAA >60 05/06/2020   GFRAA >60 05/06/2020    Lab Results  Component Value Date   CEA1 7,169.67 (H) 04/06/2020    Medications: I have reviewed the patient's current medications.   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 Cirrhosis. Innumerable liver lesions.  Colonoscopy 12/18/2019-ulcerated partially obstructing large mass in the mid ascending colon. Biopsy invasive adenocarcinoma; preserved expression major MMR  proteins  Upper endoscopy 12/18/2019-normal esophagus, stomach, examined duodenum  CEA 12/19/2019-8,923  Biopsy liver lesion 12/23/2019-adenocarcinoma  Foundation 1-K-ras wild-type; tumor mutation burden greater than or equal to 10; microsatellite stable; BRAF V600E  Cycle 1 FOLFOX 01/01/2020  Cycle 2 FOLFOX 01/15/2020  Cycle 3 FOLFOX 01/29/2020, Udenyca added  Cycle 4 FOLFOX 02/12/2020, Udenyca held  Cycle 5 FOLFOX 02/26/2020, Udenyca  Cycle 6 FOLFOX 03/11/2020, Udenyca held  CT abdomen/pelvis 03/16/2020-stable diffuse liver lesions, stable strictured appearing ascending colon, possible pericolonic implant, vague left lower lobe nodule  Cycle 1 FOLFIRI/Avastin 04/06/2020  Cycle 2 FOLFIRI/Avastin 04/21/2020  Cycle 3 FOLFIRI/Avastin 05/06/2020 2. 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 Cirrhosis 4. 12/26/2019-hospital admission for right lower extremity DVT and GI bleed, treated with heparin anticoagulation beginning 12/26/2019, converted to Lovenox at discharge 12/29/2019 5. History of low-grade fever-likely "tumor"fever 6. COVID-19 infection January 2021   Disposition: [name] has completed 2 cycles of FOLFIRI/Avastin.  She is tolerating the treatment well.  She will complete cycle 3 today. She will return for the second COVID-19 vaccine next week. [name] will return for an office visit and chemotherapy in 2 weeks.  [name], MD  05/06/2020  10:51 AM

## 2020-05-06 NOTE — Patient Instructions (Signed)
[facility] Discharge Instructions for Patients Receiving Chemotherapy  Today you received the following chemotherapy agents: bevacizumab/irinotecan/leucovorin/5FU  To help prevent nausea and vomiting after your treatment, we encourage you to take your nausea medication as directed.   If you develop nausea and vomiting that is not controlled by your nausea medication, call the clinic.   BELOW ARE SYMPTOMS THAT SHOULD BE REPORTED IMMEDIATELY:  *FEVER GREATER THAN 100.5 F  *CHILLS WITH OR WITHOUT FEVER  NAUSEA AND VOMITING THAT IS NOT CONTROLLED WITH YOUR NAUSEA MEDICATION  *UNUSUAL SHORTNESS OF BREATH  *UNUSUAL BRUISING OR BLEEDING  TENDERNESS IN MOUTH AND THROAT WITH OR WITHOUT PRESENCE OF ULCERS  *URINARY PROBLEMS  *BOWEL PROBLEMS  UNUSUAL RASH Items with * indicate a potential emergency and should be followed up as soon as possible.  Feel free to call the clinic should you have any questions or concerns. The clinic phone number is [phone].  Please show the [facility] at check-in to the Emergency Department and triage nurse.

## 2020-05-06 NOTE — Progress Notes (Signed)
Patient reports that [facility] has not received the completed SDT form from office or medical records requested. Would like to be called about this. Forwarded request to Rosalind,RN.

## 2020-05-06 NOTE — Patient Instructions (Signed)

## 2020-05-06 NOTE — Progress Notes (Signed)
Reports day after 1st COVID vaccine she had headache, chills and low grade fever. Resolved with #2 oxycodone/apap. Declines flu vaccine today. Prefers to wait till after all her COVID vaccines are completed.

## 2020-05-07 ENCOUNTER — Telehealth: Payer: Self-pay | Admitting: *Deleted

## 2020-05-07 ENCOUNTER — Telehealth: Payer: Self-pay | Admitting: Oncology

## 2020-05-07 NOTE — Telephone Encounter (Signed)
Scheduled appointments per 9/23 los. Called patient, no answer. Left message for patient with appointments date and times.  

## 2020-05-07 NOTE — Telephone Encounter (Signed)
Informed patient that Roslind, RN called the [facility] and confirmed they have the physician's statement and records to process her STD until 06/13/20. She is hopeful to return to work on 06/14/20 and work from home.

## 2020-05-07 NOTE — Telephone Encounter (Signed)
[place] with the [facility] confirmed receipt of information sent 9/9/201 for Claim Event ID: [id number].  "Claim has been approved through June 13, 2020."

## 2020-05-08 ENCOUNTER — Inpatient Hospital Stay: Payer: BC Managed Care – PPO

## 2020-05-08 ENCOUNTER — Other Ambulatory Visit: Payer: Self-pay

## 2020-05-08 VITALS — BP 130/60 | HR 81 | Temp 97.7°F | Resp 18

## 2020-05-08 DIAGNOSIS — C182 Malignant neoplasm of ascending colon: Secondary | ICD-10-CM

## 2020-05-08 DIAGNOSIS — Z5112 Encounter for antineoplastic immunotherapy: Secondary | ICD-10-CM | POA: Diagnosis not present

## 2020-05-08 MED ORDER — HEPARIN SOD (PORK) LOCK FLUSH 100 UNIT/ML IV SOLN
500.0000 [IU] | Freq: Once | INTRAVENOUS | Status: AC | PRN
  Administered 2020-05-08: 500 [IU]
  Filled 2020-05-08: qty 5

## 2020-05-08 MED ORDER — PEGFILGRASTIM-CBQV 6 MG/0.6ML ~~LOC~~ SOSY
6.0000 mg | PREFILLED_SYRINGE | Freq: Once | SUBCUTANEOUS | Status: AC
  Administered 2020-05-08: 6 mg via SUBCUTANEOUS

## 2020-05-08 MED ORDER — SODIUM CHLORIDE 0.9% FLUSH
10.0000 mL | INTRAVENOUS | Status: DC | PRN
  Administered 2020-05-08: 10 mL
  Filled 2020-05-08: qty 10

## 2020-05-08 NOTE — Patient Instructions (Signed)

## 2020-05-12 ENCOUNTER — Other Ambulatory Visit: Payer: Self-pay

## 2020-05-12 ENCOUNTER — Inpatient Hospital Stay: Payer: BC Managed Care – PPO

## 2020-05-12 DIAGNOSIS — Z23 Encounter for immunization: Secondary | ICD-10-CM

## 2020-05-12 NOTE — Progress Notes (Signed)
   Covid-19 Vaccination Clinic  Name:  Stacy Burns    MRN: [id number] DOB: 01/21/1946  05/12/2020  Stacy Burns was observed post Covid-19 immunization for 15 minutes without incident. She was provided with Vaccine Information Sheet and instruction to access the V-Safe system.   Stacy Burns was instructed to call 911 with any severe reactions post vaccine: Marland Kitchen Difficulty breathing  . Swelling of face and throat  . A fast heartbeat  . A bad rash all over body  . Dizziness and weakness   Immunizations Administered    Name Date Dose VIS Date Route   Pfizer COVID-19 Vaccine 05/12/2020  9:16 AM 0.3 mL 10/08/2018 Intramuscular   Manufacturer: [place]   Lot: [id number]   [place]: [id number]

## 2020-05-13 ENCOUNTER — Encounter: Payer: Self-pay | Admitting: *Deleted

## 2020-05-13 NOTE — Progress Notes (Unsigned)
Received in mail request for medical records from [facility] for all records related to [id number]. Request forwarded to HIM department.

## 2020-05-16 ENCOUNTER — Other Ambulatory Visit: Payer: Self-pay | Admitting: Oncology

## 2020-05-20 ENCOUNTER — Other Ambulatory Visit: Payer: Self-pay

## 2020-05-20 ENCOUNTER — Inpatient Hospital Stay: Payer: BC Managed Care – PPO

## 2020-05-20 ENCOUNTER — Inpatient Hospital Stay: Payer: BC Managed Care – PPO | Admitting: Nurse Practitioner

## 2020-05-20 ENCOUNTER — Inpatient Hospital Stay: Payer: BC Managed Care – PPO | Attending: Oncology

## 2020-05-20 ENCOUNTER — Encounter: Payer: Self-pay | Admitting: Nurse Practitioner

## 2020-05-20 VITALS — BP 154/65 | HR 66 | Temp 97.6°F | Resp 16 | Ht 67.0 in | Wt 144.9 lb

## 2020-05-20 DIAGNOSIS — Z86718 Personal history of other venous thrombosis and embolism: Secondary | ICD-10-CM | POA: Insufficient documentation

## 2020-05-20 DIAGNOSIS — Z5189 Encounter for other specified aftercare: Secondary | ICD-10-CM | POA: Diagnosis not present

## 2020-05-20 DIAGNOSIS — C787 Secondary malignant neoplasm of liver and intrahepatic bile duct: Secondary | ICD-10-CM | POA: Insufficient documentation

## 2020-05-20 DIAGNOSIS — Z7901 Long term (current) use of anticoagulants: Secondary | ICD-10-CM | POA: Diagnosis not present

## 2020-05-20 DIAGNOSIS — Z5112 Encounter for antineoplastic immunotherapy: Secondary | ICD-10-CM | POA: Insufficient documentation

## 2020-05-20 DIAGNOSIS — D500 Iron deficiency anemia secondary to blood loss (chronic): Secondary | ICD-10-CM | POA: Diagnosis not present

## 2020-05-20 DIAGNOSIS — C182 Malignant neoplasm of ascending colon: Secondary | ICD-10-CM

## 2020-05-20 DIAGNOSIS — Z23 Encounter for immunization: Secondary | ICD-10-CM | POA: Insufficient documentation

## 2020-05-20 DIAGNOSIS — Z452 Encounter for adjustment and management of vascular access device: Secondary | ICD-10-CM | POA: Diagnosis not present

## 2020-05-20 DIAGNOSIS — Z95828 Presence of other vascular implants and grafts: Secondary | ICD-10-CM

## 2020-05-20 DIAGNOSIS — Z5111 Encounter for antineoplastic chemotherapy: Secondary | ICD-10-CM | POA: Insufficient documentation

## 2020-05-20 LAB — CMP (CANCER CENTER ONLY)
ALT: 34 U/L (ref 0–44)
AST: 31 U/L (ref 15–41)
Albumin: 3.1 g/dL — ABNORMAL LOW (ref 3.5–5.0)
Alkaline Phosphatase: 240 U/L — ABNORMAL HIGH (ref 38–126)
Anion gap: 5 (ref 5–15)
BUN: 16 mg/dL (ref 8–23)
CO2: 26 mmol/L (ref 22–32)
Calcium: 9.5 mg/dL (ref 8.9–10.3)
Chloride: 106 mmol/L (ref 98–111)
Creatinine: 0.73 mg/dL (ref 0.44–1.00)
GFR, Estimated: >60 mL/min (ref >60)
Glucose, Bld: 85 mg/dL (ref 70–99)
Potassium: 3.7 mmol/L (ref 3.5–5.1)
Sodium: 137 mmol/L (ref 135–145)
Total Bilirubin: 0.3 mg/dL (ref 0.3–1.2)
Total Protein: 6.7 g/dL (ref 6.5–8.1)

## 2020-05-20 LAB — CBC WITH DIFFERENTIAL (CANCER CENTER ONLY)
Abs Immature Granulocytes: 0.65 10*3/uL — ABNORMAL HIGH (ref 0.00–0.07)
Basophils Absolute: 0.1 10*3/uL (ref 0.0–0.1)
Basophils Relative: 1 %
Eosinophils Absolute: 0.2 10*3/uL (ref 0.0–0.5)
Eosinophils Relative: 2 %
HCT: 29.6 % — ABNORMAL LOW (ref 36.0–46.0)
Hemoglobin: 9.4 g/dL — ABNORMAL LOW (ref 12.0–15.0)
Immature Granulocytes: 7 %
Lymphocytes Relative: 24 %
Lymphs Abs: 2.1 10*3/uL (ref 0.7–4.0)
MCH: 32.3 pg (ref 26.0–34.0)
MCHC: 31.8 g/dL (ref 30.0–36.0)
MCV: 101.7 fL — ABNORMAL HIGH (ref 80.0–100.0)
Monocytes Absolute: 0.9 10*3/uL (ref 0.1–1.0)
Monocytes Relative: 10 %
Neutro Abs: 4.9 10*3/uL (ref 1.7–7.7)
Neutrophils Relative %: 56 %
Platelet Count: 260 10*3/uL (ref 150–400)
RBC: 2.91 MIL/uL — ABNORMAL LOW (ref 3.87–5.11)
RDW: 19.2 % — ABNORMAL HIGH (ref 11.5–15.5)
WBC Count: 8.8 10*3/uL (ref 4.0–10.5)
nRBC: 0 % (ref 0.0–0.2)

## 2020-05-20 LAB — TOTAL PROTEIN, URINE DIPSTICK

## 2020-05-20 LAB — CEA (IN HOUSE-CHCC): CEA (CHCC-In House): 569.26 ng/mL — ABNORMAL HIGH (ref 0.00–5.00)

## 2020-05-20 MED ORDER — FLUOROURACIL CHEMO INJECTION 2.5 GM/50ML
400.0000 mg/m2 | Freq: Once | INTRAVENOUS | Status: AC
  Administered 2020-05-20: 700 mg via INTRAVENOUS
  Filled 2020-05-20: qty 14

## 2020-05-20 MED ORDER — PALONOSETRON HCL INJECTION 0.25 MG/5ML
0.2500 mg | Freq: Once | INTRAVENOUS | Status: AC
  Administered 2020-05-20: 0.25 mg via INTRAVENOUS

## 2020-05-20 MED ORDER — ATROPINE SULFATE 1 MG/ML IJ SOLN
INTRAMUSCULAR | Status: AC
  Filled 2020-05-20: qty 1

## 2020-05-20 MED ORDER — SODIUM CHLORIDE 0.9 % IV SOLN
400.0000 mg/m2 | Freq: Once | INTRAVENOUS | Status: AC
  Administered 2020-05-20: 724 mg via INTRAVENOUS
  Filled 2020-05-20: qty 36.2

## 2020-05-20 MED ORDER — ATROPINE SULFATE 1 MG/ML IJ SOLN
0.5000 mg | Freq: Once | INTRAMUSCULAR | Status: AC | PRN
  Administered 2020-05-20: 0.5 mg via INTRAVENOUS

## 2020-05-20 MED ORDER — SODIUM CHLORIDE 0.9 % IV SOLN
2000.0000 mg/m2 | INTRAVENOUS | Status: DC
  Administered 2020-05-20: 3600 mg via INTRAVENOUS
  Filled 2020-05-20: qty 72

## 2020-05-20 MED ORDER — SODIUM CHLORIDE 0.9% FLUSH
10.0000 mL | Freq: Once | INTRAVENOUS | Status: AC
  Administered 2020-05-20: 10 mL
  Filled 2020-05-20: qty 10

## 2020-05-20 MED ORDER — SODIUM CHLORIDE 0.9 % IV SOLN
5.0000 mg/kg | Freq: Once | INTRAVENOUS | Status: AC
  Administered 2020-05-20: 350 mg via INTRAVENOUS
  Filled 2020-05-20: qty 14

## 2020-05-20 MED ORDER — SODIUM CHLORIDE 0.9 % IV SOLN
Freq: Once | INTRAVENOUS | Status: AC
  Filled 2020-05-20: qty 250

## 2020-05-20 MED ORDER — SODIUM CHLORIDE 0.9 % IV SOLN
10.0000 mg | Freq: Once | INTRAVENOUS | Status: AC
  Administered 2020-05-20: 10 mg via INTRAVENOUS
  Filled 2020-05-20: qty 10

## 2020-05-20 MED ORDER — SODIUM CHLORIDE 0.9 % IV SOLN
180.0000 mg/m2 | Freq: Once | INTRAVENOUS | Status: AC
  Administered 2020-05-20: 320 mg via INTRAVENOUS
  Filled 2020-05-20: qty 15

## 2020-05-20 MED ORDER — PALONOSETRON HCL INJECTION 0.25 MG/5ML
INTRAVENOUS | Status: AC
  Filled 2020-05-20: qty 5

## 2020-05-20 NOTE — Patient Instructions (Signed)
[facility] Discharge Instructions for Patients Receiving Chemotherapy  Today you received the following chemotherapy agents Bevacizumab-bvzr (ZIRABEV), Irinotecan (CAMPTOSAR), Leucovorin & Flourouracil (ADRUCIL).  To help prevent nausea and vomiting after your treatment, we encourage you to take your nausea medication as prescribed.   If you develop nausea and vomiting that is not controlled by your nausea medication, call the clinic.   BELOW ARE SYMPTOMS THAT SHOULD BE REPORTED IMMEDIATELY:  *FEVER GREATER THAN 100.5 F  *CHILLS WITH OR WITHOUT FEVER  NAUSEA AND VOMITING THAT IS NOT CONTROLLED WITH YOUR NAUSEA MEDICATION  *UNUSUAL SHORTNESS OF BREATH  *UNUSUAL BRUISING OR BLEEDING  TENDERNESS IN MOUTH AND THROAT WITH OR WITHOUT PRESENCE OF ULCERS  *URINARY PROBLEMS  *BOWEL PROBLEMS  UNUSUAL RASH Items with * indicate a potential emergency and should be followed up as soon as possible.  Feel free to call the clinic should you have any questions or concerns. The clinic phone number is [phone].  Please show the CHEMO ALERT CARD at check-in to the Emergency Department and triage nurse.   

## 2020-05-20 NOTE — Progress Notes (Signed)
  [facility] OFFICE PROGRESS NOTE   Diagnosis: Colon cancer  INTERVAL HISTORY:   Stacy Burns returns as scheduled.  She completed cycle 3 FOLFIRI/Avastin 05/06/2020.  She denies nausea/vomiting.  No mouth sores.  Periodic diarrhea controlled with Imodium.  No bleeding.  No pain.  Appetite is better.  She denies bleeding.  No fever or cough.  She has occasional shortness of breath.  She reports a "knot" at the right lower abdomen where she gave herself a Lovenox injection.  Objective:  Vital signs in last 24 hours:  Blood pressure (!) 154/65, pulse 66, temperature 97.6 F (36.4 C), temperature source Tympanic, resp. rate 16, height $RemoveBe'5\' 7"'OOXDWNwmb$  (1.702 m), weight 144 lb 14.4 oz (65.7 kg), SpO2 100 %.    HEENT: No thrush or ulcers. Resp: Lungs clear bilaterally. Cardio: Regular rate and rhythm. GI: Abdomen soft and nontender.  No hepatomegaly. Vascular: No leg edema. Neuro: Alert and oriented. Skin: Resolving ecchymosis at the right lower abdomen with approximate 4 cm probable hematoma same region. Port-A-Cath without erythema.  Lab Results:  Lab Results  Component Value Date   WBC 8.8 05/20/2020   HGB 9.4 (L) 05/20/2020   HCT 29.6 (L) 05/20/2020   MCV 101.7 (H) 05/20/2020   PLT 260 05/20/2020   NEUTROABS 4.9 05/20/2020    Imaging:  No results found.  Medications: I have reviewed the patient's current medications.  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 Cirrhosis. Innumerable liver lesions.  Colonoscopy 12/18/2019-ulcerated partially obstructing large mass in the mid ascending colon. Biopsy invasive adenocarcinoma; preserved expression major MMR proteins  Upper endoscopy 12/18/2019-normal esophagus, stomach, examined duodenum  CEA 12/19/2019-8,923  Biopsy liver lesion 12/23/2019-adenocarcinoma  Foundation 1-K-ras wild-type; tumor  mutation burden greater than or equal to 10; microsatellite stable; BRAF V600E  Cycle 1 FOLFOX 01/01/2020  Cycle 2 FOLFOX 01/15/2020  Cycle 3 FOLFOX 01/29/2020, Udenyca added  Cycle 4 FOLFOX 02/12/2020, Udenyca held  Cycle 5 FOLFOX 02/26/2020, Udenyca  Cycle 6 FOLFOX 03/11/2020, Udenyca held  CT abdomen/pelvis 03/16/2020-stable diffuse liver lesions, stable strictured appearing ascending colon, possible pericolonic implant, vague left lower lobe nodule  Cycle 1 FOLFIRI/Avastin 04/06/2020  Cycle 2 FOLFIRI/Avastin 04/21/2020  Cycle 3 FOLFIRI/Avastin 05/06/2020  Cycle 4 FOLFIRI/Avastin 05/20/2020 2. 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 Cirrhosis 4. 12/26/2019-hospital admission for right lower extremity DVT and GI bleed, treated with heparin anticoagulation beginning 12/26/2019, converted to Lovenox at discharge 12/29/2019 5. History of low-grade fever-likely "tumor"fever 6. COVID-19 infection January 2021    Disposition: Ms. Hamlett appears stable.  She has completed three cycles of FOLFIRI/Avastin.  There is no clinical evidence of disease progression.  Most recent CEA was improved.  Plan to proceed with cycle four FOLFIRI/Avastin today as scheduled.  Restaging CTs after cycle five.  We reviewed the CBC and chemistry panel from today.  Labs adequate to proceed with treatment.  She likely has a hematoma at the right lower abdomen related to a Lovenox injection.  She will contact the office with any changes in this area.  She will return for lab, follow-up, cycle 5 FOLFIRI/Avastin in 2 weeks.  Plan reviewed with Dr. Benay Spice.    Ned Card ANP/GNP-BC   05/20/2020  12:20 PM

## 2020-05-22 ENCOUNTER — Other Ambulatory Visit: Payer: Self-pay

## 2020-05-22 ENCOUNTER — Inpatient Hospital Stay: Payer: BC Managed Care – PPO

## 2020-05-22 VITALS — BP 134/70 | HR 83 | Temp 97.8°F | Resp 18

## 2020-05-22 DIAGNOSIS — Z5112 Encounter for antineoplastic immunotherapy: Secondary | ICD-10-CM | POA: Diagnosis not present

## 2020-05-22 MED ORDER — HEPARIN SOD (PORK) LOCK FLUSH 100 UNIT/ML IV SOLN
500.0000 [IU] | Freq: Once | INTRAVENOUS | Status: AC | PRN
  Administered 2020-05-22: 500 [IU]
  Filled 2020-05-22: qty 5

## 2020-05-22 MED ORDER — SODIUM CHLORIDE 0.9% FLUSH
10.0000 mL | INTRAVENOUS | Status: DC | PRN
  Administered 2020-05-22: 10 mL
  Filled 2020-05-22: qty 10

## 2020-05-22 MED ORDER — PEGFILGRASTIM-CBQV 6 MG/0.6ML ~~LOC~~ SOSY
6.0000 mg | PREFILLED_SYRINGE | Freq: Once | SUBCUTANEOUS | Status: AC
  Administered 2020-05-22: 6 mg via SUBCUTANEOUS

## 2020-05-22 NOTE — Patient Instructions (Signed)

## 2020-05-24 ENCOUNTER — Other Ambulatory Visit: Payer: Self-pay | Admitting: Oncology

## 2020-05-26 ENCOUNTER — Telehealth: Payer: Self-pay

## 2020-05-26 NOTE — Telephone Encounter (Signed)
Answered patient's questions about appointment times. Patient verbalized understanding.

## 2020-05-28 ENCOUNTER — Other Ambulatory Visit: Payer: Self-pay | Admitting: Oncology

## 2020-06-03 ENCOUNTER — Encounter: Payer: Self-pay | Admitting: Nurse Practitioner

## 2020-06-03 ENCOUNTER — Inpatient Hospital Stay: Payer: BC Managed Care – PPO

## 2020-06-03 ENCOUNTER — Other Ambulatory Visit: Payer: Self-pay

## 2020-06-03 ENCOUNTER — Inpatient Hospital Stay (HOSPITAL_BASED_OUTPATIENT_CLINIC_OR_DEPARTMENT_OTHER): Payer: BC Managed Care – PPO | Admitting: Nurse Practitioner

## 2020-06-03 VITALS — BP 135/63 | HR 63 | Temp 97.3°F | Resp 16 | Ht 67.0 in | Wt 144.3 lb

## 2020-06-03 DIAGNOSIS — C182 Malignant neoplasm of ascending colon: Secondary | ICD-10-CM | POA: Diagnosis not present

## 2020-06-03 DIAGNOSIS — Z5112 Encounter for antineoplastic immunotherapy: Secondary | ICD-10-CM | POA: Diagnosis not present

## 2020-06-03 DIAGNOSIS — Z23 Encounter for immunization: Secondary | ICD-10-CM

## 2020-06-03 DIAGNOSIS — Z95828 Presence of other vascular implants and grafts: Secondary | ICD-10-CM

## 2020-06-03 LAB — CMP (CANCER CENTER ONLY)
ALT: 25 U/L (ref 0–44)
AST: 31 U/L (ref 15–41)
Albumin: 3.2 g/dL — ABNORMAL LOW (ref 3.5–5.0)
Alkaline Phosphatase: 222 U/L — ABNORMAL HIGH (ref 38–126)
Anion gap: 8 (ref 5–15)
BUN: 13 mg/dL (ref 8–23)
CO2: 25 mmol/L (ref 22–32)
Calcium: 9.5 mg/dL (ref 8.9–10.3)
Chloride: 107 mmol/L (ref 98–111)
Creatinine: 0.78 mg/dL (ref 0.44–1.00)
GFR, Estimated: >60 mL/min (ref >60)
Glucose, Bld: 84 mg/dL (ref 70–99)
Potassium: 3.8 mmol/L (ref 3.5–5.1)
Sodium: 140 mmol/L (ref 135–145)
Total Bilirubin: 0.3 mg/dL (ref 0.3–1.2)
Total Protein: 6.4 g/dL — ABNORMAL LOW (ref 6.5–8.1)

## 2020-06-03 LAB — CBC WITH DIFFERENTIAL (CANCER CENTER ONLY)
Abs Immature Granulocytes: 0.39 10*3/uL — ABNORMAL HIGH (ref 0.00–0.07)
Basophils Absolute: 0.1 10*3/uL (ref 0.0–0.1)
Basophils Relative: 1 %
Eosinophils Absolute: 0.2 10*3/uL (ref 0.0–0.5)
Eosinophils Relative: 2 %
HCT: 29.3 % — ABNORMAL LOW (ref 36.0–46.0)
Hemoglobin: 9.4 g/dL — ABNORMAL LOW (ref 12.0–15.0)
Immature Granulocytes: 5 %
Lymphocytes Relative: 20 %
Lymphs Abs: 1.6 10*3/uL (ref 0.7–4.0)
MCH: 33.3 pg (ref 26.0–34.0)
MCHC: 32.1 g/dL (ref 30.0–36.0)
MCV: 103.9 fL — ABNORMAL HIGH (ref 80.0–100.0)
Monocytes Absolute: 0.9 10*3/uL (ref 0.1–1.0)
Monocytes Relative: 11 %
Neutro Abs: 5 10*3/uL (ref 1.7–7.7)
Neutrophils Relative %: 61 %
Platelet Count: 185 10*3/uL (ref 150–400)
RBC: 2.82 MIL/uL — ABNORMAL LOW (ref 3.87–5.11)
RDW: 18.6 % — ABNORMAL HIGH (ref 11.5–15.5)
WBC Count: 8.2 10*3/uL (ref 4.0–10.5)
nRBC: 0 % (ref 0.0–0.2)

## 2020-06-03 LAB — CEA (IN HOUSE-CHCC): CEA (CHCC-In House): 491.98 ng/mL — ABNORMAL HIGH (ref 0.00–5.00)

## 2020-06-03 MED ORDER — SODIUM CHLORIDE 0.9 % IV SOLN
Freq: Once | INTRAVENOUS | Status: AC
  Filled 2020-06-03: qty 250

## 2020-06-03 MED ORDER — PALONOSETRON HCL INJECTION 0.25 MG/5ML
0.2500 mg | Freq: Once | INTRAVENOUS | Status: AC
  Administered 2020-06-03: 0.25 mg via INTRAVENOUS

## 2020-06-03 MED ORDER — SODIUM CHLORIDE 0.9 % IV SOLN
10.0000 mg | Freq: Once | INTRAVENOUS | Status: AC
  Administered 2020-06-03: 10 mg via INTRAVENOUS
  Filled 2020-06-03: qty 10

## 2020-06-03 MED ORDER — PALONOSETRON HCL INJECTION 0.25 MG/5ML
INTRAVENOUS | Status: AC
  Filled 2020-06-03: qty 5

## 2020-06-03 MED ORDER — SODIUM CHLORIDE 0.9% FLUSH
10.0000 mL | Freq: Once | INTRAVENOUS | Status: AC
  Administered 2020-06-03: 10 mL
  Filled 2020-06-03: qty 10

## 2020-06-03 MED ORDER — ATROPINE SULFATE 1 MG/ML IJ SOLN
0.5000 mg | Freq: Once | INTRAMUSCULAR | Status: AC | PRN
  Administered 2020-06-03: 0.5 mg via INTRAVENOUS

## 2020-06-03 MED ORDER — ATROPINE SULFATE 1 MG/ML IJ SOLN
INTRAMUSCULAR | Status: AC
  Filled 2020-06-03: qty 1

## 2020-06-03 MED ORDER — SODIUM CHLORIDE 0.9 % IV SOLN
180.0000 mg/m2 | Freq: Once | INTRAVENOUS | Status: AC
  Administered 2020-06-03: 320 mg via INTRAVENOUS
  Filled 2020-06-03: qty 15

## 2020-06-03 MED ORDER — FLUOROURACIL CHEMO INJECTION 2.5 GM/50ML
400.0000 mg/m2 | Freq: Once | INTRAVENOUS | Status: AC
  Administered 2020-06-03: 700 mg via INTRAVENOUS
  Filled 2020-06-03: qty 14

## 2020-06-03 MED ORDER — SODIUM CHLORIDE 0.9 % IV SOLN
5.0000 mg/kg | Freq: Once | INTRAVENOUS | Status: AC
  Administered 2020-06-03: 350 mg via INTRAVENOUS
  Filled 2020-06-03: qty 14

## 2020-06-03 MED ORDER — INFLUENZA VAC A&B SA ADJ QUAD 0.5 ML IM PRSY
PREFILLED_SYRINGE | INTRAMUSCULAR | Status: AC
  Filled 2020-06-03: qty 0.5

## 2020-06-03 MED ORDER — SODIUM CHLORIDE 0.9 % IV SOLN
400.0000 mg/m2 | Freq: Once | INTRAVENOUS | Status: AC
  Administered 2020-06-03: 724 mg via INTRAVENOUS
  Filled 2020-06-03: qty 36.2

## 2020-06-03 MED ORDER — INFLUENZA VAC A&B SA ADJ QUAD 0.5 ML IM PRSY
0.5000 mL | PREFILLED_SYRINGE | Freq: Once | INTRAMUSCULAR | Status: AC
  Administered 2020-06-03: 0.5 mL via INTRAMUSCULAR

## 2020-06-03 MED ORDER — SODIUM CHLORIDE 0.9 % IV SOLN
2000.0000 mg/m2 | INTRAVENOUS | Status: DC
  Administered 2020-06-03: 3600 mg via INTRAVENOUS
  Filled 2020-06-03: qty 72

## 2020-06-03 NOTE — Patient Instructions (Signed)

## 2020-06-03 NOTE — Progress Notes (Signed)
  [facility] OFFICE PROGRESS NOTE   Diagnosis: Colon cancer  INTERVAL HISTORY:   Stacy Burns returns as scheduled.  She completed cycle 4 FOLFIRI/Avastin 05/20/2020.  No significant nausea/vomiting after chemotherapy.  No mouth sores.  Periodic diarrhea.  She takes Imodium as needed.  She has recently noticed remittent numbness in several fingertips and toes.  When she puts her hands in warm water the numbness improves.  She has an occasional nosebleed that resolves within about 2 minutes.  Stable mild dyspnea on exertion.  No chest pain.  No leg swelling or calf pain.  Objective:  Vital signs in last 24 hours:  Blood pressure 135/63, pulse 63, temperature (!) 97.3 F (36.3 C), temperature source Tympanic, resp. rate 16, height $RemoveBe'5\' 7"'CBFoeHhNL$  (1.702 m), weight 144 lb 4.8 oz (65.5 kg), SpO2 100 %.    HEENT: No thrush or ulcers. Resp: Lungs clear bilaterally. Cardio: Regular rate and rhythm. GI: Abdomen soft and nontender.  Palpable liver edge. Vascular: No leg edema. Neuro: Vibratory sense with mild to moderate decrease over the fingertips bilaterally per tuning fork exam. Skin: Palms without erythema. Port-A-Cath without erythema.   Lab Results:  Lab Results  Component Value Date   WBC 8.2 06/03/2020   HGB 9.4 (L) 06/03/2020   HCT 29.3 (L) 06/03/2020   MCV 103.9 (H) 06/03/2020   PLT 185 06/03/2020   NEUTROABS 5.0 06/03/2020    Imaging:  No results found.  Medications: I have reviewed the patient's current medications.  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 Cirrhosis. Innumerable liver lesions.  Colonoscopy 12/18/2019-ulcerated partially obstructing large mass in the mid ascending colon. Biopsy invasive adenocarcinoma; preserved expression major MMR proteins  Upper endoscopy 12/18/2019-normal esophagus, stomach, examined duodenum  CEA  12/19/2019-8,923  Biopsy liver lesion 12/23/2019-adenocarcinoma  Foundation 1-K-ras wild-type; tumor mutation burden greater than or equal to 10; microsatellite stable; BRAF V600E  Cycle 1 FOLFOX 01/01/2020  Cycle 2 FOLFOX 01/15/2020  Cycle 3 FOLFOX 01/29/2020, Udenyca added  Cycle 4 FOLFOX 02/12/2020, Udenyca held  Cycle 5 FOLFOX 02/26/2020, Udenyca  Cycle 6 FOLFOX 03/11/2020, Udenyca held  CT abdomen/pelvis 03/16/2020-stable diffuse liver lesions, stable strictured appearing ascending colon, possible pericolonic implant, vague left lower lobe nodule  Cycle 1 FOLFIRI/Avastin 04/06/2020  Cycle 2 FOLFIRI/Avastin 04/21/2020  Cycle 3 FOLFIRI/Avastin 05/06/2020  Cycle 4 FOLFIRI/Avastin 05/20/2020  Cycle 5 FOLFIRI/Avastin 06/03/2020 2. 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 Cirrhosis 4. 12/26/2019-hospital admission for right lower extremity DVT and GI bleed, treated with heparin anticoagulation beginning 12/26/2019, converted to Lovenox at discharge 12/29/2019 5. History of low-grade fever-likely "tumor"fever 6. COVID-19 infection January 2021    Disposition: Ms. Saline appears stable.  She has completed 4 cycles of FOLFIRI/Avastin.  Plan to proceed with cycle 5 today as scheduled.  Restaging CTs after this cycle.  We reviewed the CBC and chemistry panel from today.  Labs adequate to proceed with treatment.  The recent intermittent numbness in the fingers and toes is probably related to previous oxaliplatin.  It improves with warming.  We will continue to monitor.  She will return for lab, follow-up, treatment in 2 weeks.  She will contact the office in the interim with any problems.  Influenza vaccine will be administered today.    Ned Card ANP/GNP-BC   06/03/2020  11:40 AM

## 2020-06-03 NOTE — Patient Instructions (Signed)
[facility] Discharge Instructions for Patients Receiving Chemotherapy  Today you received the following chemotherapy agents: Avastin, Leucovorin, Irinotecan, and Fluorouracil  To help prevent nausea and vomiting after your treatment, we encourage you to take your nausea medication  as prescribed.    If you develop nausea and vomiting that is not controlled by your nausea medication, call the clinic.   BELOW ARE SYMPTOMS THAT SHOULD BE REPORTED IMMEDIATELY:  *FEVER GREATER THAN 100.5 F  *CHILLS WITH OR WITHOUT FEVER  NAUSEA AND VOMITING THAT IS NOT CONTROLLED WITH YOUR NAUSEA MEDICATION  *UNUSUAL SHORTNESS OF BREATH  *UNUSUAL BRUISING OR BLEEDING  TENDERNESS IN MOUTH AND THROAT WITH OR WITHOUT PRESENCE OF ULCERS  *URINARY PROBLEMS  *BOWEL PROBLEMS  UNUSUAL RASH Items with * indicate a potential emergency and should be followed up as soon as possible.  Feel free to call the clinic should you have any questions or concerns. The clinic phone number is [phone].  Please show the [facility] at check-in to the Emergency Department and triage nurse.

## 2020-06-04 ENCOUNTER — Telehealth: Payer: Self-pay | Admitting: Nurse Practitioner

## 2020-06-04 ENCOUNTER — Encounter: Payer: Self-pay | Admitting: *Deleted

## 2020-06-04 NOTE — Progress Notes (Signed)
Faxed return to work letter to [employer] [phone] as requested.

## 2020-06-04 NOTE — Telephone Encounter (Signed)
Scheduled appointments per 10/21 los. Will have updated calendar printed for patient with appointments dates and times.

## 2020-06-05 ENCOUNTER — Inpatient Hospital Stay: Payer: BC Managed Care – PPO

## 2020-06-05 ENCOUNTER — Other Ambulatory Visit: Payer: Self-pay

## 2020-06-05 VITALS — BP 122/57 | HR 87 | Temp 97.9°F | Resp 18

## 2020-06-05 DIAGNOSIS — Z5112 Encounter for antineoplastic immunotherapy: Secondary | ICD-10-CM | POA: Diagnosis not present

## 2020-06-05 DIAGNOSIS — C182 Malignant neoplasm of ascending colon: Secondary | ICD-10-CM

## 2020-06-05 MED ORDER — HEPARIN SOD (PORK) LOCK FLUSH 100 UNIT/ML IV SOLN
500.0000 [IU] | Freq: Once | INTRAVENOUS | Status: AC | PRN
  Administered 2020-06-05: 500 [IU]
  Filled 2020-06-05: qty 5

## 2020-06-05 MED ORDER — SODIUM CHLORIDE 0.9% FLUSH
10.0000 mL | INTRAVENOUS | Status: DC | PRN
  Administered 2020-06-05: 10 mL
  Filled 2020-06-05: qty 10

## 2020-06-05 MED ORDER — PEGFILGRASTIM-CBQV 6 MG/0.6ML ~~LOC~~ SOSY
6.0000 mg | PREFILLED_SYRINGE | Freq: Once | SUBCUTANEOUS | Status: AC
  Administered 2020-06-05: 6 mg via SUBCUTANEOUS

## 2020-06-05 NOTE — Patient Instructions (Signed)

## 2020-06-10 ENCOUNTER — Other Ambulatory Visit: Payer: Self-pay | Admitting: Oncology

## 2020-06-15 ENCOUNTER — Other Ambulatory Visit: Payer: Self-pay

## 2020-06-15 ENCOUNTER — Encounter (HOSPITAL_COMMUNITY): Payer: Self-pay

## 2020-06-15 ENCOUNTER — Ambulatory Visit (HOSPITAL_COMMUNITY)
Admission: RE | Admit: 2020-06-15 | Discharge: 2020-06-15 | Disposition: A | Discharge status: Home / Self Care | Payer: BC Managed Care – PPO | Source: Ambulatory Visit | Attending: Nurse Practitioner | Admitting: Nurse Practitioner

## 2020-06-15 DIAGNOSIS — C182 Malignant neoplasm of ascending colon: Secondary | ICD-10-CM | POA: Diagnosis not present

## 2020-06-15 HISTORY — DX: Malignant (primary) neoplasm, unspecified: C80.1

## 2020-06-15 MED ORDER — IOHEXOL 300 MG/ML  SOLN
100.0000 mL | Freq: Once | INTRAMUSCULAR | Status: AC | PRN
  Administered 2020-06-15: 100 mL via INTRAVENOUS

## 2020-06-16 ENCOUNTER — Telehealth: Payer: Self-pay | Admitting: *Deleted

## 2020-06-16 NOTE — Telephone Encounter (Signed)
-----   Message from Ladell Pier, MD sent at 06/15/2020  7:09 PM EDT ----- Please call patient, CT shows improvement in liver metastases, f/u as scheduled

## 2020-06-16 NOTE — Telephone Encounter (Signed)
Notified of CT results that liver mets are improved w/no new or progressive disease elsewhere. F/U tomorrow as scheduled.

## 2020-06-17 ENCOUNTER — Other Ambulatory Visit: Payer: Self-pay

## 2020-06-17 ENCOUNTER — Encounter: Payer: Self-pay | Admitting: Nurse Practitioner

## 2020-06-17 ENCOUNTER — Inpatient Hospital Stay: Payer: BC Managed Care – PPO

## 2020-06-17 ENCOUNTER — Inpatient Hospital Stay: Payer: BC Managed Care – PPO | Attending: Oncology

## 2020-06-17 ENCOUNTER — Inpatient Hospital Stay (HOSPITAL_BASED_OUTPATIENT_CLINIC_OR_DEPARTMENT_OTHER): Payer: BC Managed Care – PPO | Admitting: Nurse Practitioner

## 2020-06-17 VITALS — BP 117/60 | HR 73 | Temp 97.0°F | Resp 16 | Ht 67.0 in | Wt 137.1 lb

## 2020-06-17 VITALS — BP 106/84

## 2020-06-17 DIAGNOSIS — C182 Malignant neoplasm of ascending colon: Secondary | ICD-10-CM | POA: Insufficient documentation

## 2020-06-17 DIAGNOSIS — Z5112 Encounter for antineoplastic immunotherapy: Secondary | ICD-10-CM | POA: Diagnosis present

## 2020-06-17 DIAGNOSIS — D500 Iron deficiency anemia secondary to blood loss (chronic): Secondary | ICD-10-CM | POA: Diagnosis not present

## 2020-06-17 DIAGNOSIS — E876 Hypokalemia: Secondary | ICD-10-CM | POA: Insufficient documentation

## 2020-06-17 DIAGNOSIS — Z452 Encounter for adjustment and management of vascular access device: Secondary | ICD-10-CM | POA: Insufficient documentation

## 2020-06-17 DIAGNOSIS — Z5189 Encounter for other specified aftercare: Secondary | ICD-10-CM | POA: Diagnosis not present

## 2020-06-17 DIAGNOSIS — Z5111 Encounter for antineoplastic chemotherapy: Secondary | ICD-10-CM | POA: Insufficient documentation

## 2020-06-17 DIAGNOSIS — D649 Anemia, unspecified: Secondary | ICD-10-CM

## 2020-06-17 DIAGNOSIS — C787 Secondary malignant neoplasm of liver and intrahepatic bile duct: Secondary | ICD-10-CM | POA: Diagnosis not present

## 2020-06-17 DIAGNOSIS — Z95828 Presence of other vascular implants and grafts: Secondary | ICD-10-CM

## 2020-06-17 LAB — CMP (CANCER CENTER ONLY)
ALT: 19 U/L (ref 0–44)
AST: 24 U/L (ref 15–41)
Albumin: 3.4 g/dL — ABNORMAL LOW (ref 3.5–5.0)
Alkaline Phosphatase: 212 U/L — ABNORMAL HIGH (ref 38–126)
Anion gap: 10 (ref 5–15)
BUN: 17 mg/dL (ref 8–23)
CO2: 22 mmol/L (ref 22–32)
Calcium: 9.1 mg/dL (ref 8.9–10.3)
Chloride: 107 mmol/L (ref 98–111)
Creatinine: 0.97 mg/dL (ref 0.44–1.00)
GFR, Estimated: >60 mL/min (ref >60)
Glucose, Bld: 85 mg/dL (ref 70–99)
Potassium: 3.4 mmol/L — ABNORMAL LOW (ref 3.5–5.1)
Sodium: 139 mmol/L (ref 135–145)
Total Bilirubin: 0.4 mg/dL (ref 0.3–1.2)
Total Protein: 6.5 g/dL (ref 6.5–8.1)

## 2020-06-17 LAB — CBC WITH DIFFERENTIAL (CANCER CENTER ONLY)
Abs Immature Granulocytes: 0.11 10*3/uL — ABNORMAL HIGH (ref 0.00–0.07)
Basophils Absolute: 0.1 10*3/uL (ref 0.0–0.1)
Basophils Relative: 1 %
Eosinophils Absolute: 0.1 10*3/uL (ref 0.0–0.5)
Eosinophils Relative: 2 %
HCT: 31 % — ABNORMAL LOW (ref 36.0–46.0)
Hemoglobin: 10.1 g/dL — ABNORMAL LOW (ref 12.0–15.0)
Immature Granulocytes: 2 %
Lymphocytes Relative: 23 %
Lymphs Abs: 1.3 10*3/uL (ref 0.7–4.0)
MCH: 33.4 pg (ref 26.0–34.0)
MCHC: 32.6 g/dL (ref 30.0–36.0)
MCV: 102.6 fL — ABNORMAL HIGH (ref 80.0–100.0)
Monocytes Absolute: 0.7 10*3/uL (ref 0.1–1.0)
Monocytes Relative: 13 %
Neutro Abs: 3.5 10*3/uL (ref 1.7–7.7)
Neutrophils Relative %: 59 %
Platelet Count: 201 10*3/uL (ref 150–400)
RBC: 3.02 MIL/uL — ABNORMAL LOW (ref 3.87–5.11)
RDW: 17.9 % — ABNORMAL HIGH (ref 11.5–15.5)
WBC Count: 5.8 10*3/uL (ref 4.0–10.5)
nRBC: 0 % (ref 0.0–0.2)

## 2020-06-17 LAB — CEA (IN HOUSE-CHCC): CEA (CHCC-In House): 413.77 ng/mL — ABNORMAL HIGH (ref 0.00–5.00)

## 2020-06-17 MED ORDER — PALONOSETRON HCL INJECTION 0.25 MG/5ML
INTRAVENOUS | Status: AC
  Filled 2020-06-17: qty 5

## 2020-06-17 MED ORDER — SODIUM CHLORIDE 0.9 % IV SOLN
Freq: Once | INTRAVENOUS | Status: AC
  Filled 2020-06-17: qty 250

## 2020-06-17 MED ORDER — FLUOROURACIL CHEMO INJECTION 2.5 GM/50ML
400.0000 mg/m2 | Freq: Once | INTRAVENOUS | Status: AC
  Administered 2020-06-17: 700 mg via INTRAVENOUS
  Filled 2020-06-17: qty 14

## 2020-06-17 MED ORDER — ATROPINE SULFATE 1 MG/ML IJ SOLN
INTRAMUSCULAR | Status: AC
  Filled 2020-06-17: qty 1

## 2020-06-17 MED ORDER — SODIUM CHLORIDE 0.9 % IV SOLN
10.0000 mg | Freq: Once | INTRAVENOUS | Status: AC
  Administered 2020-06-17: 10 mg via INTRAVENOUS
  Filled 2020-06-17: qty 10

## 2020-06-17 MED ORDER — SODIUM CHLORIDE 0.9% FLUSH
10.0000 mL | Freq: Once | INTRAVENOUS | Status: AC
  Administered 2020-06-17: 10 mL
  Filled 2020-06-17: qty 10

## 2020-06-17 MED ORDER — SODIUM CHLORIDE 0.9% FLUSH
10.0000 mL | INTRAVENOUS | Status: DC | PRN
  Filled 2020-06-17: qty 10

## 2020-06-17 MED ORDER — PALONOSETRON HCL INJECTION 0.25 MG/5ML
0.2500 mg | Freq: Once | INTRAVENOUS | Status: AC
  Administered 2020-06-17: 0.25 mg via INTRAVENOUS

## 2020-06-17 MED ORDER — ATROPINE SULFATE 1 MG/ML IJ SOLN
0.5000 mg | Freq: Once | INTRAMUSCULAR | Status: AC | PRN
  Administered 2020-06-17: 0.5 mg via INTRAVENOUS

## 2020-06-17 MED ORDER — SODIUM CHLORIDE 0.9 % IV SOLN
400.0000 mg/m2 | Freq: Once | INTRAVENOUS | Status: AC
  Administered 2020-06-17: 724 mg via INTRAVENOUS
  Filled 2020-06-17: qty 36.2

## 2020-06-17 MED ORDER — SODIUM CHLORIDE 0.9 % IV SOLN
180.0000 mg/m2 | Freq: Once | INTRAVENOUS | Status: AC
  Administered 2020-06-17: 320 mg via INTRAVENOUS
  Filled 2020-06-17: qty 15

## 2020-06-17 MED ORDER — HEPARIN SOD (PORK) LOCK FLUSH 100 UNIT/ML IV SOLN
500.0000 [IU] | Freq: Once | INTRAVENOUS | Status: DC | PRN
  Filled 2020-06-17: qty 5

## 2020-06-17 MED ORDER — OXYCODONE-ACETAMINOPHEN 5-325 MG PO TABS: 1.0000 | ORAL_TABLET | ORAL | 0 refills | Status: DC | PRN

## 2020-06-17 MED ORDER — SODIUM CHLORIDE 0.9 % IV SOLN
4.5000 mg/kg | Freq: Once | INTRAVENOUS | Status: AC
  Administered 2020-06-17: 300 mg via INTRAVENOUS
  Filled 2020-06-17: qty 12

## 2020-06-17 MED ORDER — SODIUM CHLORIDE 0.9 % IV SOLN
2000.0000 mg/m2 | INTRAVENOUS | Status: DC
  Administered 2020-06-17: 3600 mg via INTRAVENOUS
  Filled 2020-06-17: qty 72

## 2020-06-17 NOTE — Patient Instructions (Signed)
[facility] Discharge Instructions for Patients Receiving Chemotherapy  Today you received the following chemotherapy agents: Avastin, Leucovorin, Irinotecan, and Fluorouracil  To help prevent nausea and vomiting after your treatment, we encourage you to take your nausea medication  as prescribed.    If you develop nausea and vomiting that is not controlled by your nausea medication, call the clinic.   BELOW ARE SYMPTOMS THAT SHOULD BE REPORTED IMMEDIATELY:  *FEVER GREATER THAN 100.5 F  *CHILLS WITH OR WITHOUT FEVER  NAUSEA AND VOMITING THAT IS NOT CONTROLLED WITH YOUR NAUSEA MEDICATION  *UNUSUAL SHORTNESS OF BREATH  *UNUSUAL BRUISING OR BLEEDING  TENDERNESS IN MOUTH AND THROAT WITH OR WITHOUT PRESENCE OF ULCERS  *URINARY PROBLEMS  *BOWEL PROBLEMS  UNUSUAL RASH Items with * indicate a potential emergency and should be followed up as soon as possible.  Feel free to call the clinic should you have any questions or concerns. The clinic phone number is [phone].  Please show the [facility] at check-in to the Emergency Department and triage nurse.

## 2020-06-17 NOTE — Progress Notes (Addendum)
[facility] OFFICE PROGRESS NOTE   Diagnosis: Colon cancer  INTERVAL HISTORY:   Stacy Burns returns as scheduled.  She completed cycle 5 FOLFIRI/Avastin 06/03/2000.  No nausea or vomiting following chemotherapy.  She had an episode yesterday after the CT scan.  No mouth sores.  She tends to have mild diarrhea after chemotherapy.  Occasional nosebleeds.  No other bleeding.  Unchanged neuropathy symptoms.  Occasional lower abdominal pain.  She takes oxycodone as needed.  Objective:  Vital signs in last 24 hours:  Blood pressure 117/60, pulse 73, temperature (!) 97 F (36.1 C), temperature source Tympanic, resp. rate 16, height _0  (1.702 m), weight 137 lb 1.6 oz (62.2 kg), SpO2 100 %.    HEENT: No thrush or ulcers. Resp: Lungs clear bilaterally. Cardio: Regular rate and rhythm. GI: Abdomen soft and nontender.  Liver edge is palpable. Vascular: No leg edema. Neuro: Alert and oriented. Skin: Palms with hyperpigmentation.  No skin breakdown. Port-A-Cath without erythema.   Lab Results:  Lab Results  Component Value Date   WBC 5.8 06/17/2020   HGB 10.1 (L) 06/17/2020   HCT 31.0 (L) 06/17/2020   MCV 102.6 (H) 06/17/2020   PLT 201 06/17/2020   NEUTROABS 3.5 06/17/2020    Imaging:  No results found.  Medications: I have reviewed the patient's current medications.  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 Cirrhosis. Innumerable liver lesions.  Colonoscopy 12/18/2019-ulcerated partially obstructing large mass in the mid ascending colon. Biopsy invasive adenocarcinoma; preserved expression major MMR proteins  Upper endoscopy 12/18/2019-normal esophagus, stomach, examined duodenum  CEA 12/19/2019-8,923  Biopsy liver lesion 12/23/2019-adenocarcinoma  Foundation 1-K-ras wild-type; tumor mutation burden greater than or equal to 10;  microsatellite stable; BRAF V600E  Cycle 1 FOLFOX 01/01/2020  Cycle 2 FOLFOX 01/15/2020  Cycle 3 FOLFOX 01/29/2020, Udenyca added  Cycle 4 FOLFOX 02/12/2020, Udenyca held  Cycle 5 FOLFOX 02/26/2020, Udenyca  Cycle 6 FOLFOX 03/11/2020, Udenyca held  CT abdomen/pelvis 03/16/2020-stable diffuse liver lesions, stable strictured appearing ascending colon, possible pericolonic implant, vague left lower lobe nodule  Cycle 1 FOLFIRI/Avastin 04/06/2020  Cycle 2 FOLFIRI/Avastin 04/21/2020  Cycle 3 FOLFIRI/Avastin 05/06/2020  Cycle4FOLFIRI/Avastin 05/20/2020  Cycle 5 FOLFIRI/Avastin 06/03/2020  CTs 06/15/2020-mild to moderate improvement in hepatic metastasis.  No new or progressive disease.  Narrowing within the proximal ascending colon with upstream mild cecal and terminal ileum dilatation, similar.  Cycle 6 FOLFIRI/Avastin 06/17/2020 2. 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 Cirrhosis 4. 12/26/2019-hospital admission for right lower extremity DVT and GI bleed, treated with heparin anticoagulation beginning 12/26/2019, converted to Lovenox at discharge 12/29/2019 5. History of low-grade fever-likely "tumor"fever 6. COVID-19 infection January 2021  Disposition: Stacy Burns appears stable.  She has completed 5 cycles of FOLFIRI/Avastin.  Recent restaging CTs show improvement in liver metastases, no new or progressive disease.  Results/images reviewed with her at today's visit.  Dr. Benay Spice recommends continuation of FOLFIRI/Avastin, cycle 6 today.  We reviewed the CBC from today.  Counts adequate to proceed with treatment.  She will return for lab, follow-up, cycle 7 FOLFIRI in 2 weeks.  She will contact the office in the interim with any problems.  Patient seen with Dr. Benay Spice.    Ned Card ANP/GNP-BC   06/17/2020  10:42 AM This was a shared visit with Ned Card.  Stacy Burns has completed 5 cycles of FOLFIRI/Avastin.  The CEA is lower  and the restaging CT is consistent with  a response to therapy.  We reviewed the CT images with Stacy Burns.  I recommend continuing FOLFIRI/Avastin.  Julieanne Manson, MD

## 2020-06-19 ENCOUNTER — Other Ambulatory Visit: Payer: Self-pay

## 2020-06-19 ENCOUNTER — Inpatient Hospital Stay: Payer: BC Managed Care – PPO

## 2020-06-19 VITALS — BP 134/49 | HR 82 | Temp 98.5°F | Resp 24

## 2020-06-19 DIAGNOSIS — C182 Malignant neoplasm of ascending colon: Secondary | ICD-10-CM

## 2020-06-19 DIAGNOSIS — Z5112 Encounter for antineoplastic immunotherapy: Secondary | ICD-10-CM | POA: Diagnosis not present

## 2020-06-19 MED ORDER — PEGFILGRASTIM-CBQV 6 MG/0.6ML ~~LOC~~ SOSY
6.0000 mg | PREFILLED_SYRINGE | Freq: Once | SUBCUTANEOUS | Status: AC
  Administered 2020-06-19: 6 mg via SUBCUTANEOUS

## 2020-06-19 MED ORDER — HEPARIN SOD (PORK) LOCK FLUSH 100 UNIT/ML IV SOLN
500.0000 [IU] | Freq: Once | INTRAVENOUS | Status: AC | PRN
  Administered 2020-06-19: 500 [IU]
  Filled 2020-06-19: qty 5

## 2020-06-19 MED ORDER — SODIUM CHLORIDE 0.9% FLUSH
10.0000 mL | INTRAVENOUS | Status: DC | PRN
  Administered 2020-06-19: 10 mL
  Filled 2020-06-19: qty 10

## 2020-06-19 NOTE — Patient Instructions (Signed)

## 2020-06-21 ENCOUNTER — Telehealth: Payer: Self-pay | Admitting: Nurse Practitioner

## 2020-06-21 NOTE — Telephone Encounter (Signed)
Scheduled appointments per 11/4 los. Spoke to patient who is aware of appointments dates and times. Patient is aware of time gap for appointments as well. Those were the only times available that day.

## 2020-06-27 ENCOUNTER — Other Ambulatory Visit: Payer: Self-pay | Admitting: Oncology

## 2020-07-01 ENCOUNTER — Inpatient Hospital Stay: Payer: BC Managed Care – PPO | Admitting: Nutrition

## 2020-07-01 ENCOUNTER — Inpatient Hospital Stay: Payer: BC Managed Care – PPO

## 2020-07-01 ENCOUNTER — Encounter: Payer: Self-pay | Admitting: General Practice

## 2020-07-01 ENCOUNTER — Inpatient Hospital Stay: Payer: BC Managed Care – PPO | Admitting: Oncology

## 2020-07-01 ENCOUNTER — Other Ambulatory Visit: Payer: Self-pay | Admitting: *Deleted

## 2020-07-01 ENCOUNTER — Other Ambulatory Visit: Payer: Self-pay

## 2020-07-01 VITALS — BP 110/59 | HR 82 | Temp 98.4°F | Resp 17 | Ht 67.0 in | Wt 135.1 lb

## 2020-07-01 DIAGNOSIS — D649 Anemia, unspecified: Secondary | ICD-10-CM

## 2020-07-01 DIAGNOSIS — C182 Malignant neoplasm of ascending colon: Secondary | ICD-10-CM

## 2020-07-01 DIAGNOSIS — E876 Hypokalemia: Secondary | ICD-10-CM

## 2020-07-01 DIAGNOSIS — Z5112 Encounter for antineoplastic immunotherapy: Secondary | ICD-10-CM | POA: Diagnosis not present

## 2020-07-01 DIAGNOSIS — Z95828 Presence of other vascular implants and grafts: Secondary | ICD-10-CM

## 2020-07-01 LAB — CBC WITH DIFFERENTIAL (CANCER CENTER ONLY)
Abs Immature Granulocytes: 0.79 10*3/uL — ABNORMAL HIGH (ref 0.00–0.07)
Basophils Absolute: 0.1 10*3/uL (ref 0.0–0.1)
Basophils Relative: 1 %
Eosinophils Absolute: 0.1 10*3/uL (ref 0.0–0.5)
Eosinophils Relative: 1 %
HCT: 30.4 % — ABNORMAL LOW (ref 36.0–46.0)
Hemoglobin: 9.8 g/dL — ABNORMAL LOW (ref 12.0–15.0)
Immature Granulocytes: 7 %
Lymphocytes Relative: 12 %
Lymphs Abs: 1.3 10*3/uL (ref 0.7–4.0)
MCH: 33.8 pg (ref 26.0–34.0)
MCHC: 32.2 g/dL (ref 30.0–36.0)
MCV: 104.8 fL — ABNORMAL HIGH (ref 80.0–100.0)
Monocytes Absolute: 0.8 10*3/uL (ref 0.1–1.0)
Monocytes Relative: 7 %
Neutro Abs: 8 10*3/uL — ABNORMAL HIGH (ref 1.7–7.7)
Neutrophils Relative %: 72 %
Platelet Count: 154 10*3/uL (ref 150–400)
RBC: 2.9 MIL/uL — ABNORMAL LOW (ref 3.87–5.11)
RDW: 17.6 % — ABNORMAL HIGH (ref 11.5–15.5)
WBC Count: 11.1 10*3/uL — ABNORMAL HIGH (ref 4.0–10.5)
nRBC: 0 % (ref 0.0–0.2)

## 2020-07-01 LAB — CMP (CANCER CENTER ONLY)
ALT: 12 U/L (ref 0–44)
AST: 20 U/L (ref 15–41)
Albumin: 3.2 g/dL — ABNORMAL LOW (ref 3.5–5.0)
Alkaline Phosphatase: 203 U/L — ABNORMAL HIGH (ref 38–126)
Anion gap: 11 (ref 5–15)
BUN: 13 mg/dL (ref 8–23)
CO2: 21 mmol/L — ABNORMAL LOW (ref 22–32)
Calcium: 9 mg/dL (ref 8.9–10.3)
Chloride: 114 mmol/L — ABNORMAL HIGH (ref 98–111)
Creatinine: 0.87 mg/dL (ref 0.44–1.00)
GFR, Estimated: >60 mL/min (ref >60)
Glucose, Bld: 101 mg/dL — ABNORMAL HIGH (ref 70–99)
Potassium: 3 mmol/L — ABNORMAL LOW (ref 3.5–5.1)
Sodium: 146 mmol/L — ABNORMAL HIGH (ref 135–145)
Total Bilirubin: 0.3 mg/dL (ref 0.3–1.2)
Total Protein: 6 g/dL — ABNORMAL LOW (ref 6.5–8.1)

## 2020-07-01 LAB — CEA (IN HOUSE-CHCC): CEA (CHCC-In House): 389.34 ng/mL — ABNORMAL HIGH (ref 0.00–5.00)

## 2020-07-01 MED ORDER — SODIUM CHLORIDE 0.9 % IV SOLN
5.0000 mg/kg | Freq: Once | INTRAVENOUS | Status: AC
  Administered 2020-07-01: 300 mg via INTRAVENOUS
  Filled 2020-07-01: qty 12

## 2020-07-01 MED ORDER — SODIUM CHLORIDE 0.9 % IV SOLN
Freq: Once | INTRAVENOUS | Status: AC
  Filled 2020-07-01: qty 250

## 2020-07-01 MED ORDER — SODIUM CHLORIDE 0.9 % IV SOLN
2000.0000 mg/m2 | INTRAVENOUS | Status: DC
  Administered 2020-07-01: 3400 mg via INTRAVENOUS
  Filled 2020-07-01: qty 68

## 2020-07-01 MED ORDER — PALONOSETRON HCL INJECTION 0.25 MG/5ML
0.2500 mg | Freq: Once | INTRAVENOUS | Status: AC
  Administered 2020-07-01: 0.25 mg via INTRAVENOUS

## 2020-07-01 MED ORDER — ATROPINE SULFATE 1 MG/ML IJ SOLN
0.5000 mg | Freq: Once | INTRAMUSCULAR | Status: AC | PRN
  Administered 2020-07-01: 0.5 mg via INTRAVENOUS

## 2020-07-01 MED ORDER — SODIUM CHLORIDE 0.9 % IV SOLN
400.0000 mg/m2 | Freq: Once | INTRAVENOUS | Status: AC
  Administered 2020-07-01: 680 mg via INTRAVENOUS
  Filled 2020-07-01: qty 34

## 2020-07-01 MED ORDER — POTASSIUM CHLORIDE CRYS ER 20 MEQ PO TBCR: 20.0000 meq | EXTENDED_RELEASE_TABLET | Freq: Two times a day (BID) | ORAL | 2 refills | Status: DC

## 2020-07-01 MED ORDER — FLUOROURACIL CHEMO INJECTION 2.5 GM/50ML
400.0000 mg/m2 | Freq: Once | INTRAVENOUS | Status: AC
  Administered 2020-07-01: 700 mg via INTRAVENOUS
  Filled 2020-07-01: qty 14

## 2020-07-01 MED ORDER — PALONOSETRON HCL INJECTION 0.25 MG/5ML
INTRAVENOUS | Status: AC
  Filled 2020-07-01: qty 5

## 2020-07-01 MED ORDER — POTASSIUM CHLORIDE CRYS ER 20 MEQ PO TBCR
EXTENDED_RELEASE_TABLET | ORAL | Status: AC
  Filled 2020-07-01: qty 2

## 2020-07-01 MED ORDER — POTASSIUM CHLORIDE CRYS ER 20 MEQ PO TBCR
40.0000 meq | EXTENDED_RELEASE_TABLET | Freq: Once | ORAL | Status: AC
  Administered 2020-07-01: 40 meq via ORAL

## 2020-07-01 MED ORDER — ATROPINE SULFATE 1 MG/ML IJ SOLN
INTRAMUSCULAR | Status: AC
  Filled 2020-07-01: qty 1

## 2020-07-01 MED ORDER — SODIUM CHLORIDE 0.9 % IV SOLN
10.0000 mg | Freq: Once | INTRAVENOUS | Status: AC
  Administered 2020-07-01: 10 mg via INTRAVENOUS
  Filled 2020-07-01: qty 10

## 2020-07-01 MED ORDER — ENOXAPARIN SODIUM 120 MG/0.8ML ~~LOC~~ SOLN
SUBCUTANEOUS | 1 refills | Status: DC
Start: 2020-07-01 — End: 2020-09-07

## 2020-07-01 MED ORDER — SODIUM CHLORIDE 0.9 % IV SOLN
180.0000 mg/m2 | Freq: Once | INTRAVENOUS | Status: AC
  Administered 2020-07-01: 300 mg via INTRAVENOUS
  Filled 2020-07-01: qty 15

## 2020-07-01 MED ORDER — SODIUM CHLORIDE 0.9% FLUSH
10.0000 mL | Freq: Once | INTRAVENOUS | Status: AC
  Administered 2020-07-01: 10 mL
  Filled 2020-07-01: qty 10

## 2020-07-01 MED ORDER — FERROUS SULFATE 325 (65 FE) MG PO TABS: 325.0000 mg | ORAL_TABLET | Freq: Two times a day (BID) | ORAL | 2 refills | Status: DC

## 2020-07-01 NOTE — Patient Instructions (Signed)
[facility] Discharge Instructions for Patients Receiving Chemotherapy  Today you received the following chemotherapy agents: Avastin, Leucovorin, Irinotecan, and Fluorouracil  To help prevent nausea and vomiting after your treatment, we encourage you to take your nausea medication  as prescribed.    If you develop nausea and vomiting that is not controlled by your nausea medication, call the clinic.   BELOW ARE SYMPTOMS THAT SHOULD BE REPORTED IMMEDIATELY:  *FEVER GREATER THAN 100.5 F  *CHILLS WITH OR WITHOUT FEVER  NAUSEA AND VOMITING THAT IS NOT CONTROLLED WITH YOUR NAUSEA MEDICATION  *UNUSUAL SHORTNESS OF BREATH  *UNUSUAL BRUISING OR BLEEDING  TENDERNESS IN MOUTH AND THROAT WITH OR WITHOUT PRESENCE OF ULCERS  *URINARY PROBLEMS  *BOWEL PROBLEMS  UNUSUAL RASH Items with * indicate a potential emergency and should be followed up as soon as possible.  Feel free to call the clinic should you have any questions or concerns. The clinic phone number is [phone].  Please show the [facility] at check-in to the Emergency Department and triage nurse.     Hypokalemia Hypokalemia means that the amount of potassium in the blood is lower than normal. Potassium is a chemical (electrolyte) that helps regulate the amount of fluid in the body. It also stimulates muscle tightening (contraction) and helps nerves work properly. Normally, most of the body's potassium is inside cells, and only a very small amount is in the blood. Because the amount in the blood is so small, minor changes to potassium levels in the blood can be life-threatening. What are the causes? This condition may be caused by:  Antibiotic medicine.  Diarrhea or vomiting. Taking too much of a medicine that helps you have a bowel movement (laxative) can cause diarrhea and lead to hypokalemia.  Chronic kidney disease (CKD).  Medicines that help the body get rid of excess fluid  (diuretics).  Eating disorders, such as bulimia.  Low magnesium levels in the body.  Sweating a lot. What are the signs or symptoms? Symptoms of this condition include:  Weakness.  Constipation.  Fatigue.  Muscle cramps.  Mental confusion.  Skipped heartbeats or irregular heartbeat (palpitations).  Tingling or numbness. How is this diagnosed? This condition is diagnosed with a blood test. How is this treated? This condition may be treated by:  Taking potassium supplements by mouth.  Adjusting the medicines that you take.  Eating more foods that contain a lot of potassium. If your potassium level is very low, you may need to get potassium through an IV and be monitored in the hospital. Follow these instructions at home:   Take over-the-counter and prescription medicines only as told by your health care provider. This includes vitamins and supplements.  Eat a healthy diet. A healthy diet includes fresh fruits and vegetables, whole grains, healthy fats, and lean proteins.  If instructed, eat more foods that contain a lot of potassium. This includes: ? Nuts, such as peanuts and pistachios. ? Seeds, such as sunflower seeds and pumpkin seeds. ? Peas, lentils, and lima beans. ? Whole grain and bran cereals and breads. ? Fresh fruits and vegetables, such as apricots, avocado, bananas, cantaloupe, kiwi, oranges, tomatoes, asparagus, and potatoes. ? Orange juice. ? Tomato juice. ? Red meats. ? Yogurt.  Keep all follow-up visits as told by your health care provider. This is important. Contact a health care provider if you:  Have weakness that gets worse.  Feel your heart pounding or racing.  Vomit.  Have diarrhea.  Have diabetes (  diabetes mellitus) and you have trouble keeping your blood sugar (glucose) in your target range. Get help right away if you:  Have chest pain.  Have shortness of breath.  Have vomiting or diarrhea that lasts for more than 2  days.  Faint. Summary  Hypokalemia means that the amount of potassium in the blood is lower than normal.  This condition is diagnosed with a blood test.  Hypokalemia may be treated by taking potassium supplements, adjusting the medicines that you take, or eating more foods that are high in potassium.  If your potassium level is very low, you may need to get potassium through an IV and be monitored in the hospital. This information is not intended to replace advice given to you by your health care provider. Make sure you discuss any questions you have with your health care provider. Document Revised: 03/13/2018 Document Reviewed: 03/13/2018 Elsevier Patient Education  [place].

## 2020-07-01 NOTE — Progress Notes (Signed)
Asking for conversation re: Advanced Directive. Message sent to chaplain, Lattie Haw to reach out to her.

## 2020-07-01 NOTE — Progress Notes (Signed)
[facility] OFFICE PROGRESS NOTE   Diagnosis: Colon cancer  INTERVAL HISTORY:   Stacy Burns completed on cycle FOLFIRI/Avastin on 06/17/2020.  No nausea/vomiting or mouth sores.  No bleeding or symptom of thrombosis.  She reports diarrhea 3-4 times per day beginning a few days following chemotherapy.  Imodium helps.  She is currently taking 2 Imodium tablets daily.  Neuropathy symptoms have improved.  She has intermittent abdominal pain relieved with oxycodone.  Objective:  Vital signs in last 24 hours:  Blood pressure (!) 110/59, pulse 82, temperature 98.4 F (36.9 C), temperature source Tympanic, resp. rate 17, height $RemoveBe'5\' 7"'TkoRUqMiW$  (1.702 m), weight 135 lb 1.6 oz (61.3 kg), SpO2 100 %.    HEENT: No thrush or ulcers Resp: End inspiratory rales at the right posterior base, no respiratory distress Cardio: Regular rate and rhythm GI: No hepatomegaly, nontender, no mass Vascular: No leg edema    Portacath/PICC-without erythema  Lab Results:  Lab Results  Component Value Date   WBC 11.1 (H) 07/01/2020   HGB 9.8 (L) 07/01/2020   HCT 30.4 (L) 07/01/2020   MCV 104.8 (H) 07/01/2020   PLT 154 07/01/2020   NEUTROABS 8.0 (H) 07/01/2020    CMP  Lab Results  Component Value Date   NA 146 (H) 07/01/2020   K 3.0 (L) 07/01/2020   CL 114 (H) 07/01/2020   CO2 21 (L) 07/01/2020   GLUCOSE 101 (H) 07/01/2020   BUN 13 07/01/2020   CREATININE 0.87 07/01/2020   CALCIUM 9.0 07/01/2020   PROT 6.0 (L) 07/01/2020   ALBUMIN 3.2 (L) 07/01/2020   AST 20 07/01/2020   ALT 12 07/01/2020   ALKPHOS 203 (H) 07/01/2020   BILITOT 0.3 07/01/2020   GFRNONAA >60 07/01/2020   GFRAA >60 05/06/2020    Lab Results  Component Value Date   CEA1 413.77 (H) 06/17/2020     Imaging:  No results found.  Medications: I have reviewed the patient's current medications.   Assessment/Plan: 1. 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 Cirrhosis. Innumerable liver lesions.  Colonoscopy 12/18/2019-ulcerated partially obstructing large mass in the mid ascending colon. Biopsy invasive adenocarcinoma; preserved expression major MMR proteins  Upper endoscopy 12/18/2019-normal esophagus, stomach, examined duodenum  CEA 12/19/2019-8,923  Biopsy liver lesion 12/23/2019-adenocarcinoma  Foundation 1-K-ras wild-type; tumor mutation burden greater than or equal to 10; microsatellite stable; BRAF V600E  Cycle 1 FOLFOX 01/01/2020  Cycle 2 FOLFOX 01/15/2020  Cycle 3 FOLFOX 01/29/2020, Udenyca added  Cycle 4 FOLFOX 02/12/2020, Udenyca held  Cycle 5 FOLFOX 02/26/2020, Udenyca  Cycle 6 FOLFOX 03/11/2020, Udenyca held  CT abdomen/pelvis 03/16/2020-stable diffuse liver lesions, stable strictured appearing ascending colon, possible pericolonic implant, vague left lower lobe nodule  Cycle 1 FOLFIRI/Avastin 04/06/2020  Cycle 2 FOLFIRI/Avastin 04/21/2020  Cycle 3 FOLFIRI/Avastin 05/06/2020  Cycle4FOLFIRI/Avastin 05/20/2020  Cycle 5 FOLFIRI/Avastin 06/03/2020  CTs 06/15/2020-mild to moderate improvement in hepatic metastasis.  No new or progressive disease.  Narrowing within the proximal ascending colon with upstream mild cecal and terminal ileum dilatation, similar.  Cycle 6 FOLFIRI/Avastin 06/17/2020  Cycle 7 FOLFIRI/Avastin 07/01/2020 2. 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 Cirrhosis 4. 12/26/2019-hospital admission for right lower extremity DVT and GI bleed, treated with heparin anticoagulation beginning 12/26/2019, converted to Lovenox at discharge 12/29/2019 5. History of low-grade fever-likely "tumor"fever 6. COVID-19 infection January 2021    Disposition: Stacy Burns appears stable.  She has completed  6 treatments with FOLFIRI/Avastin.  She has increased diarrhea.  Encouraged her to increase the use of Imodium.  She will contact us if the diarrhea does not respond to  Imodium.  She has hypokalemia today.  She will begin a potassium supplement.  The CEA was lower when she was here on 06/17/2020.  Stacy Burns will complete another cycle of FOLFIRI/Avastin today.  She will return for an office visit and chemotherapy in 2 weeks.  I adjusted the chemotherapy doses for her weight loss.  [name], MD  07/01/2020  8:38 AM

## 2020-07-01 NOTE — Progress Notes (Signed)
Nutrition follow up completed with patient during infusion for metastatic colon cancer. Wt decreased 6% over 1 month which is significant. Reports her appetite continues to be poor and she still cannot smell or taste much, limiting her oral intake. Reports 3-4 loose stools daily on 2 Immodium. MD asked patient to increase Immodium to up to 8 daily. Noted Hypokalemia, now on potassium.  Nutrition Diagnosis: Increased nutrient needs continue.  Intervention: Continue small, frequent meals and snacks. Encouraged high calorie and high protein foods. Continue oral intake to minimize diarrhea. Take medication per MD.  Monitoring, Evaluation, Goals: Patient will tolerate increased calories and protein to minimize further wt loss.  Next Visit: Thursday, Dec 16, during infusion.

## 2020-07-01 NOTE — Progress Notes (Signed)
Per Dr. Benay Spice: OK to treat w/K+ 3.0. Will give 40 meq po today in treatment area and begin 20 meq daily at home. She will increase Imodium at home as well.

## 2020-07-01 NOTE — Progress Notes (Signed)
[facility] Note  Referred by Dutch Gray for help with questions regarding Advance Directives. Left voicemail encouraging callback.   [place], [place], [facility] Pager [phone] Voicemail [phone]

## 2020-07-02 ENCOUNTER — Telehealth: Payer: Self-pay | Admitting: Oncology

## 2020-07-02 NOTE — Telephone Encounter (Signed)
Scheduled appointments per 11/18 los. Will have updated calendar printed for patient at next visit.

## 2020-07-03 ENCOUNTER — Other Ambulatory Visit: Payer: Self-pay

## 2020-07-03 ENCOUNTER — Inpatient Hospital Stay: Payer: BC Managed Care – PPO

## 2020-07-03 VITALS — BP 134/69 | HR 70 | Temp 97.0°F | Resp 18

## 2020-07-03 DIAGNOSIS — C182 Malignant neoplasm of ascending colon: Secondary | ICD-10-CM

## 2020-07-03 DIAGNOSIS — Z5112 Encounter for antineoplastic immunotherapy: Secondary | ICD-10-CM | POA: Diagnosis not present

## 2020-07-03 MED ORDER — HEPARIN SOD (PORK) LOCK FLUSH 100 UNIT/ML IV SOLN
500.0000 [IU] | Freq: Once | INTRAVENOUS | Status: AC | PRN
  Administered 2020-07-03: 500 [IU]
  Filled 2020-07-03: qty 5

## 2020-07-03 MED ORDER — SODIUM CHLORIDE 0.9% FLUSH
10.0000 mL | INTRAVENOUS | Status: DC | PRN
  Administered 2020-07-03: 10 mL
  Filled 2020-07-03: qty 10

## 2020-07-03 MED ORDER — PEGFILGRASTIM-CBQV 6 MG/0.6ML ~~LOC~~ SOSY
6.0000 mg | PREFILLED_SYRINGE | Freq: Once | SUBCUTANEOUS | Status: AC
  Administered 2020-07-03: 6 mg via SUBCUTANEOUS

## 2020-07-03 NOTE — Patient Instructions (Signed)

## 2020-07-10 ENCOUNTER — Other Ambulatory Visit: Payer: Self-pay | Admitting: Oncology

## 2020-07-15 ENCOUNTER — Inpatient Hospital Stay (HOSPITAL_BASED_OUTPATIENT_CLINIC_OR_DEPARTMENT_OTHER): Payer: BC Managed Care – PPO | Admitting: Oncology

## 2020-07-15 ENCOUNTER — Inpatient Hospital Stay: Payer: BC Managed Care – PPO | Attending: Oncology

## 2020-07-15 ENCOUNTER — Inpatient Hospital Stay: Payer: BC Managed Care – PPO

## 2020-07-15 ENCOUNTER — Other Ambulatory Visit: Payer: Self-pay

## 2020-07-15 VITALS — BP 121/58 | HR 62 | Temp 97.9°F | Resp 18 | Ht 67.0 in | Wt 131.9 lb

## 2020-07-15 DIAGNOSIS — Z7901 Long term (current) use of anticoagulants: Secondary | ICD-10-CM | POA: Diagnosis not present

## 2020-07-15 DIAGNOSIS — Z5111 Encounter for antineoplastic chemotherapy: Secondary | ICD-10-CM | POA: Diagnosis present

## 2020-07-15 DIAGNOSIS — Z86718 Personal history of other venous thrombosis and embolism: Secondary | ICD-10-CM | POA: Diagnosis not present

## 2020-07-15 DIAGNOSIS — Z5112 Encounter for antineoplastic immunotherapy: Secondary | ICD-10-CM | POA: Diagnosis present

## 2020-07-15 DIAGNOSIS — Z23 Encounter for immunization: Secondary | ICD-10-CM | POA: Diagnosis not present

## 2020-07-15 DIAGNOSIS — C182 Malignant neoplasm of ascending colon: Secondary | ICD-10-CM

## 2020-07-15 DIAGNOSIS — Z452 Encounter for adjustment and management of vascular access device: Secondary | ICD-10-CM | POA: Insufficient documentation

## 2020-07-15 DIAGNOSIS — Z5189 Encounter for other specified aftercare: Secondary | ICD-10-CM | POA: Insufficient documentation

## 2020-07-15 DIAGNOSIS — C787 Secondary malignant neoplasm of liver and intrahepatic bile duct: Secondary | ICD-10-CM | POA: Diagnosis not present

## 2020-07-15 DIAGNOSIS — R197 Diarrhea, unspecified: Secondary | ICD-10-CM | POA: Diagnosis not present

## 2020-07-15 DIAGNOSIS — Z95828 Presence of other vascular implants and grafts: Secondary | ICD-10-CM

## 2020-07-15 DIAGNOSIS — D500 Iron deficiency anemia secondary to blood loss (chronic): Secondary | ICD-10-CM | POA: Diagnosis not present

## 2020-07-15 DIAGNOSIS — K7460 Unspecified cirrhosis of liver: Secondary | ICD-10-CM | POA: Insufficient documentation

## 2020-07-15 LAB — CBC WITH DIFFERENTIAL (CANCER CENTER ONLY)
Abs Immature Granulocytes: 1.24 10*3/uL — ABNORMAL HIGH (ref 0.00–0.07)
Basophils Absolute: 0.1 10*3/uL (ref 0.0–0.1)
Basophils Relative: 1 %
Eosinophils Absolute: 0.1 10*3/uL (ref 0.0–0.5)
Eosinophils Relative: 1 %
HCT: 28.4 % — ABNORMAL LOW (ref 36.0–46.0)
Hemoglobin: 9.1 g/dL — ABNORMAL LOW (ref 12.0–15.0)
Immature Granulocytes: 10 %
Lymphocytes Relative: 13 %
Lymphs Abs: 1.6 10*3/uL (ref 0.7–4.0)
MCH: 34.5 pg — ABNORMAL HIGH (ref 26.0–34.0)
MCHC: 32 g/dL (ref 30.0–36.0)
MCV: 107.6 fL — ABNORMAL HIGH (ref 80.0–100.0)
Monocytes Absolute: 0.9 10*3/uL (ref 0.1–1.0)
Monocytes Relative: 7 %
Neutro Abs: 8.3 10*3/uL — ABNORMAL HIGH (ref 1.7–7.7)
Neutrophils Relative %: 68 %
Platelet Count: 198 10*3/uL (ref 150–400)
RBC: 2.64 MIL/uL — ABNORMAL LOW (ref 3.87–5.11)
RDW: 17.6 % — ABNORMAL HIGH (ref 11.5–15.5)
WBC Count: 12.2 10*3/uL — ABNORMAL HIGH (ref 4.0–10.5)
nRBC: 0.2 % (ref 0.0–0.2)

## 2020-07-15 LAB — CMP (CANCER CENTER ONLY)
ALT: 18 U/L (ref 0–44)
AST: 29 U/L (ref 15–41)
Albumin: 3.2 g/dL — ABNORMAL LOW (ref 3.5–5.0)
Alkaline Phosphatase: 213 U/L — ABNORMAL HIGH (ref 38–126)
Anion gap: 9 (ref 5–15)
BUN: 12 mg/dL (ref 8–23)
CO2: 23 mmol/L (ref 22–32)
Calcium: 9.4 mg/dL (ref 8.9–10.3)
Chloride: 107 mmol/L (ref 98–111)
Creatinine: 0.81 mg/dL (ref 0.44–1.00)
GFR, Estimated: >60 mL/min (ref >60)
Glucose, Bld: 85 mg/dL (ref 70–99)
Potassium: 4.7 mmol/L (ref 3.5–5.1)
Sodium: 139 mmol/L (ref 135–145)
Total Bilirubin: 0.3 mg/dL (ref 0.3–1.2)
Total Protein: 6.3 g/dL — ABNORMAL LOW (ref 6.5–8.1)

## 2020-07-15 LAB — CEA (IN HOUSE-CHCC): CEA (CHCC-In House): 290.95 ng/mL — ABNORMAL HIGH (ref 0.00–5.00)

## 2020-07-15 MED ORDER — SODIUM CHLORIDE 0.9% FLUSH
10.0000 mL | Freq: Once | INTRAVENOUS | Status: AC
  Administered 2020-07-15: 10 mL
  Filled 2020-07-15: qty 10

## 2020-07-15 MED ORDER — ATROPINE SULFATE 1 MG/ML IJ SOLN
0.5000 mg | Freq: Once | INTRAMUSCULAR | Status: AC | PRN
  Administered 2020-07-15: 0.5 mg via INTRAVENOUS

## 2020-07-15 MED ORDER — SODIUM CHLORIDE 0.9 % IV SOLN
400.0000 mg/m2 | Freq: Once | INTRAVENOUS | Status: AC
  Administered 2020-07-15: 680 mg via INTRAVENOUS
  Filled 2020-07-15: qty 34

## 2020-07-15 MED ORDER — PALONOSETRON HCL INJECTION 0.25 MG/5ML
0.2500 mg | Freq: Once | INTRAVENOUS | Status: AC
  Administered 2020-07-15: 0.25 mg via INTRAVENOUS

## 2020-07-15 MED ORDER — SODIUM CHLORIDE 0.9 % IV SOLN
10.0000 mg | Freq: Once | INTRAVENOUS | Status: AC
  Administered 2020-07-15: 10 mg via INTRAVENOUS
  Filled 2020-07-15: qty 10

## 2020-07-15 MED ORDER — PALONOSETRON HCL INJECTION 0.25 MG/5ML
INTRAVENOUS | Status: AC
  Filled 2020-07-15: qty 5

## 2020-07-15 MED ORDER — FLUOROURACIL CHEMO INJECTION 2.5 GM/50ML
400.0000 mg/m2 | Freq: Once | INTRAVENOUS | Status: AC
  Administered 2020-07-15: 700 mg via INTRAVENOUS
  Filled 2020-07-15: qty 14

## 2020-07-15 MED ORDER — SODIUM CHLORIDE 0.9 % IV SOLN
5.0000 mg/kg | Freq: Once | INTRAVENOUS | Status: AC
  Administered 2020-07-15: 300 mg via INTRAVENOUS
  Filled 2020-07-15: qty 12

## 2020-07-15 MED ORDER — ATROPINE SULFATE 1 MG/ML IJ SOLN
INTRAMUSCULAR | Status: AC
  Filled 2020-07-15: qty 1

## 2020-07-15 MED ORDER — SODIUM CHLORIDE 0.9 % IV SOLN
180.0000 mg/m2 | Freq: Once | INTRAVENOUS | Status: AC
  Administered 2020-07-15: 300 mg via INTRAVENOUS
  Filled 2020-07-15: qty 15

## 2020-07-15 MED ORDER — SODIUM CHLORIDE 0.9 % IV SOLN
Freq: Once | INTRAVENOUS | Status: AC
  Filled 2020-07-15: qty 250

## 2020-07-15 MED ORDER — SODIUM CHLORIDE 0.9 % IV SOLN
2000.0000 mg/m2 | INTRAVENOUS | Status: DC
  Administered 2020-07-15: 3400 mg via INTRAVENOUS
  Filled 2020-07-15: qty 68

## 2020-07-15 NOTE — Progress Notes (Signed)
[facility] OFFICE PROGRESS NOTE   Diagnosis: Colon cancer  INTERVAL HISTORY:   Stacy Burns completed another cycle of FOLFIRI/Avastin 07/01/2020.  No nausea or mouth sores.  Intermittent diarrhea is improved with Imodium.  She continues Lovenox anticoagulation.  She has mild intermittent nosebleeding.  Objective:  Vital signs in last 24 hours:  Blood pressure (!) 121/58, pulse 62, temperature 97.9 F (36.6 C), temperature source Tympanic, resp. rate 18, height $RemoveBe'5\' 7"'AtARzsUUj$  (1.702 m), weight 131 lb 14.4 oz (59.8 kg), SpO2 100 %.    HEENT: No thrush or ulcers Resp: Distant breath sounds, no respiratory distress Cardio: Regular rate and rhythm GI: No hepatosplenomegaly, no mass, nontender Vascular: No leg edema   Portacath/PICC-without erythema  Lab Results:  Lab Results  Component Value Date   WBC 12.2 (H) 07/15/2020   HGB 9.1 (L) 07/15/2020   HCT 28.4 (L) 07/15/2020   MCV 107.6 (H) 07/15/2020   PLT 198 07/15/2020   NEUTROABS 8.3 (H) 07/15/2020    CMP  Lab Results  Component Value Date   NA 139 07/15/2020   K 4.7 07/15/2020   CL 107 07/15/2020   CO2 23 07/15/2020   GLUCOSE 85 07/15/2020   BUN 12 07/15/2020   CREATININE 0.81 07/15/2020   CALCIUM 9.4 07/15/2020   PROT 6.3 (L) 07/15/2020   ALBUMIN 3.2 (L) 07/15/2020   AST 29 07/15/2020   ALT 18 07/15/2020   ALKPHOS 213 (H) 07/15/2020   BILITOT 0.3 07/15/2020   GFRNONAA >60 07/15/2020   GFRAA >60 05/06/2020    Lab Results  Component Value Date   CEA1 389.34 (H) 07/01/2020     Medications: I have reviewed the patient's current medications.   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 Cirrhosis. Innumerable liver lesions.  Colonoscopy 12/18/2019-ulcerated partially obstructing large mass in the mid ascending colon. Biopsy invasive adenocarcinoma; preserved expression  major MMR proteins  Upper endoscopy 12/18/2019-normal esophagus, stomach, examined duodenum  CEA 12/19/2019-8,923  Biopsy liver lesion 12/23/2019-adenocarcinoma  Foundation 1-K-ras wild-type; tumor mutation burden greater than or equal to 10; microsatellite stable; BRAF V600E  Cycle 1 FOLFOX 01/01/2020  Cycle 2 FOLFOX 01/15/2020  Cycle 3 FOLFOX 01/29/2020, Udenyca added  Cycle 4 FOLFOX 02/12/2020, Udenyca held  Cycle 5 FOLFOX 02/26/2020, Udenyca  Cycle 6 FOLFOX 03/11/2020, Udenyca held  CT abdomen/pelvis 03/16/2020-stable diffuse liver lesions, stable strictured appearing ascending colon, possible pericolonic implant, vague left lower lobe nodule  Cycle 1 FOLFIRI/Avastin 04/06/2020  Cycle 2 FOLFIRI/Avastin 04/21/2020  Cycle 3 FOLFIRI/Avastin 05/06/2020  Cycle4FOLFIRI/Avastin 05/20/2020  Cycle 5 FOLFIRI/Avastin 06/03/2020  CTs 06/15/2020-mild to moderate improvement in hepatic metastasis.  No new or progressive disease.  Narrowing within the proximal ascending colon with upstream mild cecal and terminal ileum dilatation, similar.  Cycle 6 FOLFIRI/Avastin 06/17/2020  Cycle 7 FOLFIRI/Avastin 07/01/2020  Cycle 8 FOLFIRI/Avastin 07/15/2020 2. 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 Cirrhosis 4. 12/26/2019-hospital admission for right lower extremity DVT and GI bleed, treated with heparin anticoagulation beginning 12/26/2019, converted to Lovenox at discharge 12/29/2019 5. History of low-grade fever-likely "tumor"fever 6. COVID-19 infection January 2021     Disposition: Stacy Burns appears stable.  She is tolerating the FOLFIRI/Avastin well.  I encouraged her to begin a nutrition supplement to combat weight loss.  She will complete another cycle FOLFIRI/Avastin today.  Stacy Burns will return for an office visit and chemotherapy in 2 weeks.  The  plan is to complete a total of 10 cycles FOLFIRI/Avastin prior to another restaging CT.  [name], MD  07/15/2020  10:49  AM

## 2020-07-15 NOTE — Patient Instructions (Signed)
[facility] Discharge Instructions for Patients Receiving Chemotherapy  Today you received the following chemotherapy agents Bevacizumab-bvzr (ZIRABEV), Irinotecan (CAMPTOSAR), Leucovorin & Flourouracil (ADRUCIL).  To help prevent nausea and vomiting after your treatment, we encourage you to take your nausea medication as prescribed.   If you develop nausea and vomiting that is not controlled by your nausea medication, call the clinic.   BELOW ARE SYMPTOMS THAT SHOULD BE REPORTED IMMEDIATELY:  *FEVER GREATER THAN 100.5 F  *CHILLS WITH OR WITHOUT FEVER  NAUSEA AND VOMITING THAT IS NOT CONTROLLED WITH YOUR NAUSEA MEDICATION  *UNUSUAL SHORTNESS OF BREATH  *UNUSUAL BRUISING OR BLEEDING  TENDERNESS IN MOUTH AND THROAT WITH OR WITHOUT PRESENCE OF ULCERS  *URINARY PROBLEMS  *BOWEL PROBLEMS  UNUSUAL RASH Items with * indicate a potential emergency and should be followed up as soon as possible.  Feel free to call the clinic should you have any questions or concerns. The clinic phone number is [phone].  Please show the [facility] at check-in to the Emergency Department and triage nurse.

## 2020-07-15 NOTE — Patient Instructions (Signed)

## 2020-07-16 ENCOUNTER — Telehealth: Payer: Self-pay | Admitting: Oncology

## 2020-07-16 NOTE — Telephone Encounter (Signed)
Scheduled appointments per 12/2 los. Will have updated calendar printed for patient at next visit.

## 2020-07-17 ENCOUNTER — Inpatient Hospital Stay: Payer: BC Managed Care – PPO

## 2020-07-17 ENCOUNTER — Other Ambulatory Visit: Payer: Self-pay

## 2020-07-17 VITALS — BP 121/54 | HR 90 | Temp 98.6°F | Resp 18

## 2020-07-17 DIAGNOSIS — C182 Malignant neoplasm of ascending colon: Secondary | ICD-10-CM

## 2020-07-17 DIAGNOSIS — Z5112 Encounter for antineoplastic immunotherapy: Secondary | ICD-10-CM | POA: Diagnosis not present

## 2020-07-17 MED ORDER — SODIUM CHLORIDE 0.9% FLUSH
10.0000 mL | INTRAVENOUS | Status: DC | PRN
  Administered 2020-07-17: 10 mL
  Filled 2020-07-17: qty 10

## 2020-07-17 MED ORDER — PEGFILGRASTIM-CBQV 6 MG/0.6ML ~~LOC~~ SOSY
6.0000 mg | PREFILLED_SYRINGE | Freq: Once | SUBCUTANEOUS | Status: AC
  Administered 2020-07-17: 6 mg via SUBCUTANEOUS

## 2020-07-17 MED ORDER — HEPARIN SOD (PORK) LOCK FLUSH 100 UNIT/ML IV SOLN
500.0000 [IU] | Freq: Once | INTRAVENOUS | Status: AC | PRN
  Administered 2020-07-17: 500 [IU]
  Filled 2020-07-17: qty 5

## 2020-07-17 NOTE — Patient Instructions (Signed)
Pegfilgrastim injection What is this medicine? PEGFILGRASTIM (PEG fil gra stim) is a long-acting granulocyte colony-stimulating factor that stimulates the growth of neutrophils, a type of white blood cell important in the body's fight against infection. It is used to reduce the incidence of fever and infection in patients with certain types of cancer who are receiving chemotherapy that affects the bone marrow, and to increase survival after being exposed to high doses of radiation. This medicine may be used for other purposes; ask your health care provider or pharmacist if you have questions. COMMON BRAND NAME(S): Fulphila, Neulasta, UDENYCA, Ziextenzo What should I tell my health care provider before I take this medicine? They need to know if you have any of these conditions:  kidney disease  latex allergy  ongoing radiation therapy  sickle cell disease  skin reactions to acrylic adhesives (On-Body Injector only)  an unusual or allergic reaction to pegfilgrastim, filgrastim, other medicines, foods, dyes, or preservatives  pregnant or trying to get pregnant  breast-feeding How should I use this medicine? This medicine is for injection under the skin. If you get this medicine at home, you will be taught how to prepare and give the pre-filled syringe or how to use the On-body Injector. Refer to the patient Instructions for Use for detailed instructions. Use exactly as directed. Tell your healthcare provider immediately if you suspect that the On-body Injector may not have performed as intended or if you suspect the use of the On-body Injector resulted in a missed or partial dose. It is important that you put your used needles and syringes in a special sharps container. Do not put them in a trash can. If you do not have a sharps container, call your pharmacist or healthcare provider to get one. Talk to your pediatrician regarding the use of this medicine in children. While this drug may be  prescribed for selected conditions, precautions do apply. Overdosage: If you think you have taken too much of this medicine contact a poison control center or emergency room at once. NOTE: This medicine is only for you. Do not share this medicine with others. What if I miss a dose? It is important not to miss your dose. Call your doctor or health care professional if you miss your dose. If you miss a dose due to an On-body Injector failure or leakage, a new dose should be administered as soon as possible using a single prefilled syringe for manual use. What may interact with this medicine? Interactions have not been studied. Give your health care provider a list of all the medicines, herbs, non-prescription drugs, or dietary supplements you use. Also tell them if you smoke, drink alcohol, or use illegal drugs. Some items may interact with your medicine. This list may not describe all possible interactions. Give your health care provider a list of all the medicines, herbs, non-prescription drugs, or dietary supplements you use. Also tell them if you smoke, drink alcohol, or use illegal drugs. Some items may interact with your medicine. What should I watch for while using this medicine? You may need blood work done while you are taking this medicine. If you are going to need a MRI, CT scan, or other procedure, tell your doctor that you are using this medicine (On-Body Injector only). What side effects may I notice from receiving this medicine? Side effects that you should report to your doctor or health care professional as soon as possible:  allergic reactions like skin rash, itching or hives, swelling of the   face, lips, or tongue  back pain  dizziness  fever  pain, redness, or irritation at site where injected  pinpoint red spots on the skin  red or dark-[place] urine  shortness of breath or breathing problems  stomach or side pain, or pain at the  shoulder  swelling  tiredness  trouble passing urine or change in the amount of urine Side effects that usually do not require medical attention (report to your doctor or health care professional if they continue or are bothersome):  bone pain  muscle pain This list may not describe all possible side effects. Call your doctor for medical advice about side effects. You may report side effects to FDA at 1-800-FDA-1088. Where should I keep my medicine? Keep out of the reach of children. If you are using this medicine at home, you will be instructed on how to store it. Throw away any unused medicine after the expiration date on the label. NOTE: This sheet is a summary. It may not cover all possible information. If you have questions about this medicine, talk to your doctor, pharmacist, or health care provider.  2020 Elsevier/Gold Standard (2017-11-05 16:57:08) Implanted Port Home Guide An implanted port is a device that is placed under the skin. It is usually placed in the chest. The device can be used to give IV medicine, to take blood, or for dialysis. You may have an implanted port if:  You need IV medicine that would be irritating to the small veins in your hands or arms.  You need IV medicines, such as antibiotics, for a long period of time.  You need IV nutrition for a long period of time.  You need dialysis. Having a port means that your health care provider will not need to use the veins in your arms for these procedures. You may have fewer limitations when using a port than you would if you used other types of long-term IVs, and you will likely be able to return to normal activities after your incision heals. An implanted port has two main parts:  Reservoir. The reservoir is the part where a needle is inserted to give medicines or draw blood. The reservoir is round. After it is placed, it appears as a small, raised area under your skin.  Catheter. The catheter is a thin,  flexible tube that connects the reservoir to a vein. Medicine that is inserted into the reservoir goes into the catheter and then into the vein. How is my port accessed? To access your port:  A numbing cream may be placed on the skin over the port site.  Your health care provider will put on a mask and sterile gloves.  The skin over your port will be cleaned carefully with a germ-killing soap and allowed to dry.  Your health care provider will gently pinch the port and insert a needle into it.  Your health care provider will check for a blood return to make sure the port is in the vein and is not clogged.  If your port needs to remain accessed to get medicine continuously (constant infusion), your health care provider will place a clear bandage (dressing) over the needle site. The dressing and needle will need to be changed every week, or as told by your health care provider. What is flushing? Flushing helps keep the port from getting clogged. Follow instructions from your health care provider about how and when to flush the port. Ports are usually flushed with saline solution or a medicine   called heparin. The need for flushing will depend on how the port is used:  If the port is only used from time to time to give medicines or draw blood, the port may need to be flushed: ? Before and after medicines have been given. ? Before and after blood has been drawn. ? As part of routine maintenance. Flushing may be recommended every 4-6 weeks.  If a constant infusion is running, the port may not need to be flushed.  Throw away any syringes in a disposal container that is meant for sharp items (sharps container). You can buy a sharps container from a pharmacy, or you can make one by using an empty hard plastic bottle with a cover. How long will my port stay implanted? The port can stay in for as long as your health care provider thinks it is needed. When it is time for the port to come out, a  surgery will be done to remove it. The surgery will be similar to the procedure that was done to put the port in. Follow these instructions at home:   Flush your port as told by your health care provider.  If you need an infusion over several days, follow instructions from your health care provider about how to take care of your port site. Make sure you: ? Wash your hands with soap and water before you change your dressing. If soap and water are not available, use alcohol-based hand sanitizer. ? Change your dressing as told by your health care provider. ? Place any used dressings or infusion bags into a plastic bag. Throw that bag in the trash. ? Keep the dressing that covers the needle clean and dry. Do not get it wet. ? Do not use scissors or sharp objects near the tube. ? Keep the tube clamped, unless it is being used.  Check your port site every day for signs of infection. Check for: ? Redness, swelling, or pain. ? Fluid or blood. ? Pus or a bad smell.  Protect the skin around the port site. ? Avoid wearing bra straps that rub or irritate the site. ? Protect the skin around your port from seat belts. Place a soft pad over your chest if needed.  Bathe or shower as told by your health care provider. The site may get wet as long as you are not actively receiving an infusion.  Return to your normal activities as told by your health care provider. Ask your health care provider what activities are safe for you.  Carry a medical alert card or wear a medical alert bracelet at all times. This will let health care providers know that you have an implanted port in case of an emergency. Get help right away if:  You have redness, swelling, or pain at the port site.  You have fluid or blood coming from your port site.  You have pus or a bad smell coming from the port site.  You have a fever. Summary  Implanted ports are usually placed in the chest for long-term IV access.  Follow  instructions from your health care provider about flushing the port and changing bandages (dressings).  Take care of the area around your port by avoiding clothing that puts pressure on the area, and by watching for signs of infection.  Protect the skin around your port from seat belts. Place a soft pad over your chest if needed.  Get help right away if you have a fever or you have redness,   swelling, pain, drainage, or a bad smell at the port site. This information is not intended to replace advice given to you by your health care provider. Make sure you discuss any questions you have with your health care provider. Document Revised: 11/22/2018 Document Reviewed: 09/02/2016 Elsevier Patient Education  2020 Elsevier Inc.  

## 2020-07-25 ENCOUNTER — Other Ambulatory Visit: Payer: Self-pay | Admitting: Oncology

## 2020-07-29 ENCOUNTER — Other Ambulatory Visit: Payer: Self-pay

## 2020-07-29 ENCOUNTER — Inpatient Hospital Stay: Payer: BC Managed Care – PPO

## 2020-07-29 ENCOUNTER — Inpatient Hospital Stay (HOSPITAL_BASED_OUTPATIENT_CLINIC_OR_DEPARTMENT_OTHER): Payer: BC Managed Care – PPO | Admitting: Nurse Practitioner

## 2020-07-29 ENCOUNTER — Telehealth: Payer: Self-pay | Admitting: Nurse Practitioner

## 2020-07-29 ENCOUNTER — Other Ambulatory Visit: Payer: Self-pay | Admitting: Nurse Practitioner

## 2020-07-29 ENCOUNTER — Encounter: Payer: Self-pay | Admitting: Nurse Practitioner

## 2020-07-29 VITALS — BP 105/43 | HR 72 | Temp 97.1°F | Resp 15 | Ht 67.0 in | Wt 127.3 lb

## 2020-07-29 DIAGNOSIS — C182 Malignant neoplasm of ascending colon: Secondary | ICD-10-CM

## 2020-07-29 DIAGNOSIS — Z5112 Encounter for antineoplastic immunotherapy: Secondary | ICD-10-CM | POA: Diagnosis not present

## 2020-07-29 LAB — CBC WITH DIFFERENTIAL (CANCER CENTER ONLY)
Abs Immature Granulocytes: 0.48 10*3/uL — ABNORMAL HIGH (ref 0.00–0.07)
Basophils Absolute: 0.1 10*3/uL (ref 0.0–0.1)
Basophils Relative: 1 %
Eosinophils Absolute: 0.1 10*3/uL (ref 0.0–0.5)
Eosinophils Relative: 1 %
HCT: 26.8 % — ABNORMAL LOW (ref 36.0–46.0)
Hemoglobin: 8.7 g/dL — ABNORMAL LOW (ref 12.0–15.0)
Immature Granulocytes: 6 %
Lymphocytes Relative: 17 %
Lymphs Abs: 1.5 10*3/uL (ref 0.7–4.0)
MCH: 34.9 pg — ABNORMAL HIGH (ref 26.0–34.0)
MCHC: 32.5 g/dL (ref 30.0–36.0)
MCV: 107.6 fL — ABNORMAL HIGH (ref 80.0–100.0)
Monocytes Absolute: 1 10*3/uL (ref 0.1–1.0)
Monocytes Relative: 11 %
Neutro Abs: 5.7 10*3/uL (ref 1.7–7.7)
Neutrophils Relative %: 64 %
Platelet Count: 204 10*3/uL (ref 150–400)
RBC: 2.49 MIL/uL — ABNORMAL LOW (ref 3.87–5.11)
RDW: 17.6 % — ABNORMAL HIGH (ref 11.5–15.5)
WBC Count: 8.8 10*3/uL (ref 4.0–10.5)
nRBC: 0.2 % (ref 0.0–0.2)

## 2020-07-29 LAB — CMP (CANCER CENTER ONLY)
ALT: 19 U/L (ref 0–44)
AST: 24 U/L (ref 15–41)
Albumin: 3.3 g/dL — ABNORMAL LOW (ref 3.5–5.0)
Alkaline Phosphatase: 219 U/L — ABNORMAL HIGH (ref 38–126)
Anion gap: 11 (ref 5–15)
BUN: 14 mg/dL (ref 8–23)
CO2: 21 mmol/L — ABNORMAL LOW (ref 22–32)
Calcium: 9.6 mg/dL (ref 8.9–10.3)
Chloride: 109 mmol/L (ref 98–111)
Creatinine: 0.83 mg/dL (ref 0.44–1.00)
GFR, Estimated: >60 mL/min (ref >60)
Glucose, Bld: 85 mg/dL (ref 70–99)
Potassium: 4.5 mmol/L (ref 3.5–5.1)
Sodium: 141 mmol/L (ref 135–145)
Total Bilirubin: 0.4 mg/dL (ref 0.3–1.2)
Total Protein: 6.3 g/dL — ABNORMAL LOW (ref 6.5–8.1)

## 2020-07-29 LAB — CEA (IN HOUSE-CHCC): CEA (CHCC-In House): 256.29 ng/mL — ABNORMAL HIGH (ref 0.00–5.00)

## 2020-07-29 LAB — C DIFFICILE QUICK SCREEN W PCR REFLEX
C Diff antigen: NEGATIVE
C Diff interpretation: NOT DETECTED
C Diff toxin: NEGATIVE

## 2020-07-29 LAB — TOTAL PROTEIN, URINE DIPSTICK: Protein, ur: 30 mg/dL — AB

## 2020-07-29 MED ORDER — ATROPINE SULFATE 1 MG/ML IJ SOLN
INTRAMUSCULAR | Status: AC
  Filled 2020-07-29: qty 1

## 2020-07-29 MED ORDER — SODIUM CHLORIDE 0.9 % IV SOLN
5.0000 mg/kg | Freq: Once | INTRAVENOUS | Status: AC
  Administered 2020-07-29: 15:00:00 300 mg via INTRAVENOUS
  Filled 2020-07-29: qty 12

## 2020-07-29 MED ORDER — PALONOSETRON HCL INJECTION 0.25 MG/5ML
0.2500 mg | Freq: Once | INTRAVENOUS | Status: AC
  Administered 2020-07-29: 12:00:00 0.25 mg via INTRAVENOUS

## 2020-07-29 MED ORDER — ATROPINE SULFATE 1 MG/ML IJ SOLN
0.5000 mg | Freq: Once | INTRAMUSCULAR | Status: AC | PRN
  Administered 2020-07-29: 13:00:00 0.5 mg via INTRAVENOUS

## 2020-07-29 MED ORDER — SODIUM CHLORIDE 0.9 % IV SOLN
1600.0000 mg/m2 | INTRAVENOUS | Status: DC
  Administered 2020-07-29: 15:00:00 2650 mg via INTRAVENOUS
  Filled 2020-07-29: qty 53

## 2020-07-29 MED ORDER — SODIUM CHLORIDE 0.9 % IV SOLN
200.0000 mg/m2 | Freq: Once | INTRAVENOUS | Status: AC
  Administered 2020-07-29: 13:00:00 330 mg via INTRAVENOUS
  Filled 2020-07-29: qty 16.5

## 2020-07-29 MED ORDER — SODIUM CHLORIDE 0.9 % IV SOLN
140.0000 mg/m2 | Freq: Once | INTRAVENOUS | Status: AC
  Administered 2020-07-29: 13:00:00 240 mg via INTRAVENOUS
  Filled 2020-07-29: qty 12

## 2020-07-29 MED ORDER — DIPHENOXYLATE-ATROPINE 2.5-0.025 MG PO TABS: 1.0000 | ORAL_TABLET | Freq: Four times a day (QID) | ORAL | 0 refills | Status: DC | PRN

## 2020-07-29 MED ORDER — SODIUM CHLORIDE 0.9 % IV SOLN
Freq: Once | INTRAVENOUS | Status: AC
  Filled 2020-07-29: qty 250

## 2020-07-29 MED ORDER — PALONOSETRON HCL INJECTION 0.25 MG/5ML
INTRAVENOUS | Status: AC
  Filled 2020-07-29: qty 5

## 2020-07-29 MED ORDER — SODIUM CHLORIDE 0.9 % IV SOLN
10.0000 mg | Freq: Once | INTRAVENOUS | Status: AC
  Administered 2020-07-29: 12:00:00 10 mg via INTRAVENOUS
  Filled 2020-07-29: qty 10

## 2020-07-29 NOTE — Progress Notes (Addendum)
[facility] OFFICE PROGRESS NOTE   Diagnosis: Colon cancer  INTERVAL HISTORY:   Stacy Burns returns as scheduled.  She completed cycle 8 FOLFIRI/Avastin 07/15/2020.  He has had more diarrhea, harder to control with Imodium.  She estimates 4 loose stools a day, partial improvement with Imodium.  She notices a small amount of blood with nearly every bowel movement, bright red.  She has burning discomfort after a bowel movement.  She wonders if this is related to a hemorrhoid.  No nausea or vomiting.  She had mouth sores lasting 4 days.  She reports a pruritic rash at the lower legs.  Objective:  Vital signs in last 24 hours:  Blood pressure (!) 105/43, pulse 72, temperature (!) 97.1 F (36.2 C), temperature source Tympanic, resp. rate 15, height $RemoveBe'5\' 7"'WYTLDRBlF$  (1.702 m), weight 127 lb 4.8 oz (57.7 kg), SpO2 100 %.    HEENT: No thrush or ulcers. Resp: Lungs clear bilaterally. Cardio: Regular rate and rhythm. GI: Abdomen soft and nontender.  Palpable liver edge. Vascular: No leg edema. Neuro: Alert and oriented. Skin: Palms and soles with skin thickening, hyperpigmentation.  Dry skin at the lower legs.  Ecchymoses scattered over the forearms. Port-A-Cath without erythema.  Lab Results:  Lab Results  Component Value Date   WBC 8.8 07/29/2020   HGB 8.7 (L) 07/29/2020   HCT 26.8 (L) 07/29/2020   MCV 107.6 (H) 07/29/2020   PLT 204 07/29/2020   NEUTROABS 5.7 07/29/2020    Imaging:  No results found.  Medications: I have reviewed the patient's current medications.  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 Cirrhosis. Innumerable liver lesions.  Colonoscopy 12/18/2019-ulcerated partially obstructing large mass in the mid ascending colon. Biopsy invasive adenocarcinoma; preserved expression major MMR proteins  Upper endoscopy 12/18/2019-normal  esophagus, stomach, examined duodenum  CEA 12/19/2019-8,923  Biopsy liver lesion 12/23/2019-adenocarcinoma  Foundation 1-K-ras wild-type; tumor mutation burden greater than or equal to 10; microsatellite stable; BRAF V600E  Cycle 1 FOLFOX 01/01/2020  Cycle 2 FOLFOX 01/15/2020  Cycle 3 FOLFOX 01/29/2020, Udenyca added  Cycle 4 FOLFOX 02/12/2020, Udenyca held  Cycle 5 FOLFOX 02/26/2020, Udenyca  Cycle 6 FOLFOX 03/11/2020, Udenyca held  CT abdomen/pelvis 03/16/2020-stable diffuse liver lesions, stable strictured appearing ascending colon, possible pericolonic implant, vague left lower lobe nodule  Cycle 1 FOLFIRI/Avastin 04/06/2020  Cycle 2 FOLFIRI/Avastin 04/21/2020  Cycle 3 FOLFIRI/Avastin 05/06/2020  Cycle4FOLFIRI/Avastin 05/20/2020  Cycle 5 FOLFIRI/Avastin 06/03/2020  CTs 06/15/2020-mild to moderate improvement in hepatic metastasis.  No new or progressive disease.  Narrowing within the proximal ascending colon with upstream mild cecal and terminal ileum dilatation, similar.  Cycle 6 FOLFIRI/Avastin 06/17/2020  Cycle 7 FOLFIRI/Avastin 07/01/2020  Cycle 8 FOLFIRI/Avastin 07/15/2020  Cycle 9 FOLFIRI/Avastin 07/29/2020-5-FU bolus eliminated, 5-FU infusion and irinotecan dose reduced 2. 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 Cirrhosis 4. 12/26/2019-hospital admission for right lower extremity DVT and GI bleed, treated with heparin anticoagulation beginning 12/26/2019, converted to Lovenox at discharge 12/29/2019 5. History of low-grade fever-likely "tumor"fever 6. COVID-19 infection January 2021   Disposition: Ms. Burress appears stable.  She has completed 8 cycles of FOLFIRI/Avastin.  Plan to proceed with cycle 9 today as scheduled.  She is having diarrhea, partially improved with Imodium.  She also had mouth sores after the most recent chemotherapy.  5-FU bolus eliminated, 5-FU infusion dose reduced, irinotecan dose reduced.  She will try Lomotil for  the diarrhea and  contact the office with poor control.  She is having bleeding with bowel movements.  She will contact the office if this persists/increases.  We reviewed the CBC from today.  Counts adequate to proceed with treatment.  She will return for lab, follow-up, FOLFIRI/Avastin in 2 weeks.  She will contact the office in the interim as outlined above or with any other problems.  Patient seen with Dr. Benay Spice.    Ned Card ANP/GNP-BC   07/29/2020  9:42 AM  This was a shared visit with Ned Card.  Ms. Chuong was interviewed and examined.  She had more diarrhea following the most recent cycle of chemotherapy.  We dose adjusted the chemotherapy today.  Lomotil was added for diarrhea.  Julieanne Manson, MD

## 2020-07-29 NOTE — Progress Notes (Signed)
Per Romualdo Bolk, RPH - okay to give avastin after chemotherapy.

## 2020-07-29 NOTE — Patient Instructions (Addendum)
[facility] Discharge Instructions for Patients Receiving Chemotherapy  Today you received the following chemotherapy agents: Irinotecan, Leucovorin, and Avastin   To help prevent nausea and vomiting after your treatment, we encourage you to take your nausea medication  as prescribed.    If you develop nausea and vomiting that is not controlled by your nausea medication, call the clinic.   BELOW ARE SYMPTOMS THAT SHOULD BE REPORTED IMMEDIATELY:  *FEVER GREATER THAN 100.5 F  *CHILLS WITH OR WITHOUT FEVER  NAUSEA AND VOMITING THAT IS NOT CONTROLLED WITH YOUR NAUSEA MEDICATION  *UNUSUAL SHORTNESS OF BREATH  *UNUSUAL BRUISING OR BLEEDING  TENDERNESS IN MOUTH AND THROAT WITH OR WITHOUT PRESENCE OF ULCERS  *URINARY PROBLEMS  *BOWEL PROBLEMS  UNUSUAL RASH Items with * indicate a potential emergency and should be followed up as soon as possible.  Feel free to call the clinic should you have any questions or concerns. The clinic phone number is [phone].  Please show the [facility] at check-in to the Emergency Department and triage nurse.     Diarrhea, Adult Diarrhea is when you pass loose and watery poop (stool) often. Diarrhea can make you feel weak and cause you to lose water in your body (get dehydrated). Losing water in your body can cause you to:  Feel tired and thirsty.  Have a dry mouth.  Go pee (urinate) less often. Diarrhea often lasts 2-3 days. However, it can last longer if it is a sign of something more serious. It is important to treat your diarrhea as told by your doctor. Follow these instructions at home: Eating and drinking     Follow these instructions as told by your doctor:  Take an ORS (oral rehydration solution). This is a drink that helps you replace fluids and minerals your body lost. It is sold at pharmacies and stores.  Drink plenty of fluids, such as: ? Water. ? Ice chips. ? Diluted fruit juice. ? Low-calorie  sports drinks. ? Milk, if you want.  Avoid drinking fluids that have a lot of sugar or caffeine in them.  Eat bland, easy-to-digest foods in small amounts as you are able. These foods include: ? Bananas. ? Applesauce. ? Rice. ? Low-fat (lean) meats. ? Toast. ? Crackers.  Avoid alcohol.  Avoid spicy or fatty foods.  Medicines  Take over-the-counter and prescription medicines only as told by your doctor.  If you were prescribed an antibiotic medicine, take it as told by your doctor. Do not stop using the antibiotic even if you start to feel better. General instructions   Wash your hands often using soap and water. If soap and water are not available, use a hand sanitizer. Others in your home should wash their hands as well. Hands should be washed: ? After using the toilet or changing a diaper. ? Before preparing, cooking, or serving food. ? While caring for a sick person. ? While visiting someone in a hospital.  Drink enough fluid to keep your pee (urine) pale yellow.  Rest at home while you get better.  Watch your condition for any changes.  Take a warm bath to help with any burning or pain from having diarrhea.  Keep all follow-up visits as told by your doctor. This is important. Contact a doctor if:  You have a fever.  Your diarrhea gets worse.  You have new symptoms.  You cannot keep fluids down.  You feel light-headed or dizzy.  You have a headache.  You have  muscle cramps. Get help right away if:  You have chest pain.  You feel very weak or you pass out (faint).  You have bloody or black poop or poop that looks like tar.  You have very bad pain, cramping, or bloating in your belly (abdomen).  You have trouble breathing or you are breathing very quickly.  Your heart is beating very quickly.  Your skin feels cold and clammy.  You feel confused.  You have signs of losing too much water in your body, such as: ? Dark pee, very little pee, or no  pee. ? Cracked lips. ? Dry mouth. ? Sunken eyes. ? Sleepiness. ? Weakness. Summary  Diarrhea is when you pass loose and watery poop (stool) often.  Diarrhea can make you feel weak and cause you to lose water in your body (get dehydrated).  Take an ORS (oral rehydration solution). This is a drink that is sold at pharmacies and stores.  Eat bland, easy-to-digest foods in small amounts as you are able.  Contact a doctor if your condition gets worse. Get help right away if you have signs that you have lost too much water in your body. This information is not intended to replace advice given to you by your health care provider. Make sure you discuss any questions you have with your health care provider. Document Revised: 01/04/2018 Document Reviewed: 01/04/2018 Elsevier Patient Education  [place].

## 2020-07-29 NOTE — Telephone Encounter (Signed)
I contacted Stacy Burns regarding the negative C. difficile result.  She will begin Imodium/Lomotil as we previously discussed.  She understands to contact the office with poorly controlled diarrhea.

## 2020-07-30 ENCOUNTER — Telehealth: Payer: Self-pay | Admitting: Nurse Practitioner

## 2020-07-30 NOTE — Telephone Encounter (Signed)
Scheduled appointments per 12/16 los. Spoke to patient who is aware of appointments dates and times.  

## 2020-07-31 ENCOUNTER — Other Ambulatory Visit: Payer: Self-pay

## 2020-07-31 ENCOUNTER — Inpatient Hospital Stay: Payer: BC Managed Care – PPO

## 2020-07-31 VITALS — BP 116/58 | HR 78 | Temp 97.2°F | Resp 18

## 2020-07-31 DIAGNOSIS — Z5112 Encounter for antineoplastic immunotherapy: Secondary | ICD-10-CM | POA: Diagnosis not present

## 2020-07-31 DIAGNOSIS — C182 Malignant neoplasm of ascending colon: Secondary | ICD-10-CM

## 2020-07-31 MED ORDER — PEGFILGRASTIM-CBQV 6 MG/0.6ML ~~LOC~~ SOSY
6.0000 mg | PREFILLED_SYRINGE | Freq: Once | SUBCUTANEOUS | Status: AC
  Administered 2020-07-31: 13:00:00 6 mg via SUBCUTANEOUS

## 2020-07-31 MED ORDER — SODIUM CHLORIDE 0.9% FLUSH
10.0000 mL | INTRAVENOUS | Status: DC | PRN
  Administered 2020-07-31: 13:00:00 10 mL
  Filled 2020-07-31: qty 10

## 2020-07-31 MED ORDER — HEPARIN SOD (PORK) LOCK FLUSH 100 UNIT/ML IV SOLN
500.0000 [IU] | Freq: Once | INTRAVENOUS | Status: AC | PRN
  Administered 2020-07-31: 13:00:00 500 [IU]
  Filled 2020-07-31: qty 5

## 2020-07-31 NOTE — Patient Instructions (Signed)

## 2020-08-08 ENCOUNTER — Other Ambulatory Visit: Payer: Self-pay | Admitting: Oncology

## 2020-08-12 ENCOUNTER — Inpatient Hospital Stay: Payer: BC Managed Care – PPO

## 2020-08-12 ENCOUNTER — Other Ambulatory Visit: Payer: Self-pay

## 2020-08-12 ENCOUNTER — Inpatient Hospital Stay: Payer: BC Managed Care – PPO | Admitting: Oncology

## 2020-08-12 ENCOUNTER — Other Ambulatory Visit: Payer: Self-pay | Admitting: *Deleted

## 2020-08-12 VITALS — BP 109/54 | HR 81 | Temp 98.1°F | Resp 17 | Ht 67.0 in | Wt 121.3 lb

## 2020-08-12 DIAGNOSIS — C182 Malignant neoplasm of ascending colon: Secondary | ICD-10-CM

## 2020-08-12 DIAGNOSIS — D649 Anemia, unspecified: Secondary | ICD-10-CM

## 2020-08-12 DIAGNOSIS — Z23 Encounter for immunization: Secondary | ICD-10-CM

## 2020-08-12 DIAGNOSIS — Z5112 Encounter for antineoplastic immunotherapy: Secondary | ICD-10-CM | POA: Diagnosis not present

## 2020-08-12 DIAGNOSIS — Z95828 Presence of other vascular implants and grafts: Secondary | ICD-10-CM

## 2020-08-12 LAB — CBC WITH DIFFERENTIAL (CANCER CENTER ONLY)
Abs Immature Granulocytes: 0.95 10*3/uL — ABNORMAL HIGH (ref 0.00–0.07)
Basophils Absolute: 0.1 10*3/uL (ref 0.0–0.1)
Basophils Relative: 1 %
Eosinophils Absolute: 0.1 10*3/uL (ref 0.0–0.5)
Eosinophils Relative: 1 %
HCT: 28.3 % — ABNORMAL LOW (ref 36.0–46.0)
Hemoglobin: 9 g/dL — ABNORMAL LOW (ref 12.0–15.0)
Immature Granulocytes: 6 %
Lymphocytes Relative: 11 %
Lymphs Abs: 1.6 10*3/uL (ref 0.7–4.0)
MCH: 34.7 pg — ABNORMAL HIGH (ref 26.0–34.0)
MCHC: 31.8 g/dL (ref 30.0–36.0)
MCV: 109.3 fL — ABNORMAL HIGH (ref 80.0–100.0)
Monocytes Absolute: 1 10*3/uL (ref 0.1–1.0)
Monocytes Relative: 7 %
Neutro Abs: 11.2 10*3/uL — ABNORMAL HIGH (ref 1.7–7.7)
Neutrophils Relative %: 74 %
Platelet Count: 219 10*3/uL (ref 150–400)
RBC: 2.59 MIL/uL — ABNORMAL LOW (ref 3.87–5.11)
RDW: 16.7 % — ABNORMAL HIGH (ref 11.5–15.5)
WBC Count: 14.9 10*3/uL — ABNORMAL HIGH (ref 4.0–10.5)
nRBC: 0.1 % (ref 0.0–0.2)

## 2020-08-12 LAB — CMP (CANCER CENTER ONLY)
ALT: 19 U/L (ref 0–44)
AST: 22 U/L (ref 15–41)
Albumin: 3.2 g/dL — ABNORMAL LOW (ref 3.5–5.0)
Alkaline Phosphatase: 252 U/L — ABNORMAL HIGH (ref 38–126)
Anion gap: 8 (ref 5–15)
BUN: 21 mg/dL (ref 8–23)
CO2: 20 mmol/L — ABNORMAL LOW (ref 22–32)
Calcium: 9.1 mg/dL (ref 8.9–10.3)
Chloride: 111 mmol/L (ref 98–111)
Creatinine: 0.98 mg/dL (ref 0.44–1.00)
GFR, Estimated: >60 mL/min (ref >60)
Glucose, Bld: 111 mg/dL — ABNORMAL HIGH (ref 70–99)
Potassium: 3.7 mmol/L (ref 3.5–5.1)
Sodium: 139 mmol/L (ref 135–145)
Total Bilirubin: 0.3 mg/dL (ref 0.3–1.2)
Total Protein: 6.3 g/dL — ABNORMAL LOW (ref 6.5–8.1)

## 2020-08-12 LAB — CEA (IN HOUSE-CHCC): CEA (CHCC-In House): 244.79 ng/mL — ABNORMAL HIGH (ref 0.00–5.00)

## 2020-08-12 MED ORDER — OXYCODONE-ACETAMINOPHEN 5-325 MG PO TABS: 1.0000 | ORAL_TABLET | ORAL | 0 refills | Status: DC | PRN

## 2020-08-12 MED ORDER — SODIUM CHLORIDE 0.9 % IV SOLN
Freq: Once | INTRAVENOUS | Status: AC
Start: 2020-08-12 — End: 2020-08-12
  Filled 2020-08-12: qty 250

## 2020-08-12 MED ORDER — SODIUM CHLORIDE 0.9 % IV SOLN
140.0000 mg/m2 | Freq: Once | INTRAVENOUS | Status: AC
  Administered 2020-08-12: 13:00:00 240 mg via INTRAVENOUS
  Filled 2020-08-12: qty 12

## 2020-08-12 MED ORDER — PALONOSETRON HCL INJECTION 0.25 MG/5ML
0.2500 mg | Freq: Once | INTRAVENOUS | Status: AC
  Administered 2020-08-12: 12:00:00 0.25 mg via INTRAVENOUS

## 2020-08-12 MED ORDER — SODIUM CHLORIDE 0.9% FLUSH
10.0000 mL | Freq: Once | INTRAVENOUS | Status: AC
  Administered 2020-08-12: 10 mL via INTRAVENOUS
  Filled 2020-08-12: qty 10

## 2020-08-12 MED ORDER — POTASSIUM CHLORIDE ER 10 MEQ PO TBCR: 10.0000 meq | EXTENDED_RELEASE_TABLET | Freq: Two times a day (BID) | ORAL | 2 refills | Status: DC

## 2020-08-12 MED ORDER — SODIUM CHLORIDE 0.9 % IV SOLN
200.0000 mg/m2 | Freq: Once | INTRAVENOUS | Status: AC
  Administered 2020-08-12: 13:00:00 330 mg via INTRAVENOUS
  Filled 2020-08-12: qty 16.5

## 2020-08-12 MED ORDER — SODIUM CHLORIDE 0.9 % IV SOLN
10.0000 mg | Freq: Once | INTRAVENOUS | Status: AC
  Administered 2020-08-12: 12:00:00 10 mg via INTRAVENOUS
  Filled 2020-08-12: qty 10

## 2020-08-12 MED ORDER — ATROPINE SULFATE 1 MG/ML IJ SOLN
0.5000 mg | Freq: Once | INTRAMUSCULAR | Status: AC | PRN
  Administered 2020-08-12: 13:00:00 0.5 mg via INTRAVENOUS

## 2020-08-12 MED ORDER — PALONOSETRON HCL INJECTION 0.25 MG/5ML
INTRAVENOUS | Status: AC
  Filled 2020-08-12: qty 5

## 2020-08-12 MED ORDER — ATROPINE SULFATE 1 MG/ML IJ SOLN
INTRAMUSCULAR | Status: AC
  Filled 2020-08-12: qty 1

## 2020-08-12 MED ORDER — SODIUM CHLORIDE 0.9 % IV SOLN
5.0000 mg/kg | Freq: Once | INTRAVENOUS | Status: AC
  Administered 2020-08-12: 13:00:00 300 mg via INTRAVENOUS
  Filled 2020-08-12: qty 12

## 2020-08-12 MED ORDER — SODIUM CHLORIDE 0.9 % IV SOLN
1600.0000 mg/m2 | INTRAVENOUS | Status: DC
  Administered 2020-08-12: 15:00:00 2650 mg via INTRAVENOUS
  Filled 2020-08-12: qty 53

## 2020-08-12 NOTE — Progress Notes (Signed)
   Covid-19 Vaccination Clinic  Name:  Stacy Burns    MRN: [id number] DOB: 07/05/1946  08/12/2020  [name] was observed post Covid-19 immunization for 30 minutes based on pre-vaccination screening without incident. She was provided with Vaccine Information Sheet and instruction to access the V-Safe system.   [name] was instructed to call 911 with any severe reactions post vaccine: Marland Kitchen Difficulty breathing  . Swelling of face and throat  . A fast heartbeat  . A bad rash all over body  . Dizziness and weakness

## 2020-08-12 NOTE — Addendum Note (Signed)
Addended by: Thornton Papas B on: 08/12/2020 11:31 AM   Modules accepted: Orders

## 2020-08-12 NOTE — Patient Instructions (Signed)
[facility] Discharge Instructions for Patients Receiving Chemotherapy  Today you received the following chemotherapy agents: avastin, irinotecan, leucovorin, 5FU  To help prevent nausea and vomiting after your treatment, we encourage you to take your nausea medication as directed.    If you develop nausea and vomiting that is not controlled by your nausea medication, call the clinic.   BELOW ARE SYMPTOMS THAT SHOULD BE REPORTED IMMEDIATELY:  *FEVER GREATER THAN 100.5 F  *CHILLS WITH OR WITHOUT FEVER  NAUSEA AND VOMITING THAT IS NOT CONTROLLED WITH YOUR NAUSEA MEDICATION  *UNUSUAL SHORTNESS OF BREATH  *UNUSUAL BRUISING OR BLEEDING  TENDERNESS IN MOUTH AND THROAT WITH OR WITHOUT PRESENCE OF ULCERS  *URINARY PROBLEMS  *BOWEL PROBLEMS  UNUSUAL RASH Items with * indicate a potential emergency and should be followed up as soon as possible.  Feel free to call the clinic should you have any questions or concerns. The clinic phone number is [phone].  Please show the CHEMO ALERT CARD at check-in to the Emergency Department and triage nurse.

## 2020-08-12 NOTE — Progress Notes (Signed)
[facility] OFFICE PROGRESS NOTE   Diagnosis: Colon cancer  INTERVAL HISTORY:   Stacy Burns completed another cycle of FOLFIRI/bevacizumab on 07/29/2020.  No nausea/vomiting or mouth sores.  She continues to have numbness in the hands and feet.  She has 2 loose stools per day.  She takes Imodium twice daily.  Her appetite is poor.  Objective:  Vital signs in last 24 hours:  Blood pressure (!) 109/54, pulse 81, temperature 98.1 F (36.7 C), temperature source Tympanic, resp. rate 17, height $RemoveBe'5\' 7"'RppmsnNDE$  (1.702 m), weight 121 lb 4.8 oz (55 kg), SpO2 100 %.    HEENT: No thrush or ulcers Resp: Lungs clear bilaterally Cardio: Regular rate and rhythm GI: No hepatosplenomegaly, nontender Vascular: No leg edema  Skin: Mild erythema and skin thickening at the palms/fingers with areas of superficial skin breakdown, dryness and skin thickening at the soles  Portacath/PICC-without erythema  Lab Results:  Lab Results  Component Value Date   WBC 14.9 (H) 08/12/2020   HGB 9.0 (L) 08/12/2020   HCT 28.3 (L) 08/12/2020   MCV 109.3 (H) 08/12/2020   PLT 219 08/12/2020   NEUTROABS 11.2 (H) 08/12/2020    CMP  Lab Results  Component Value Date   NA 139 08/12/2020   K 3.7 08/12/2020   CL 111 08/12/2020   CO2 20 (L) 08/12/2020   GLUCOSE 111 (H) 08/12/2020   BUN 21 08/12/2020   CREATININE 0.98 08/12/2020   CALCIUM 9.1 08/12/2020   PROT 6.3 (L) 08/12/2020   ALBUMIN 3.2 (L) 08/12/2020   AST 22 08/12/2020   ALT 19 08/12/2020   ALKPHOS 252 (H) 08/12/2020   BILITOT 0.3 08/12/2020   GFRNONAA >60 08/12/2020   GFRAA >60 05/06/2020    Lab Results  Component Value Date   CEA1 256.29 (H) 07/29/2020    Medications: I have reviewed the patient's current medications.   Assessment/Plan:  1. 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 Cirrhosis. Innumerable liver  lesions.  Colonoscopy 12/18/2019-ulcerated partially obstructing large mass in the mid ascending colon. Biopsy invasive adenocarcinoma; preserved expression major MMR proteins  Upper endoscopy 12/18/2019-normal esophagus, stomach, examined duodenum  CEA 12/19/2019-8,923  Biopsy liver lesion 12/23/2019-adenocarcinoma  Foundation 1-K-ras wild-type; tumor mutation burden greater than or equal to 10; microsatellite stable; BRAF V600E  Cycle 1 FOLFOX 01/01/2020  Cycle 2 FOLFOX 01/15/2020  Cycle 3 FOLFOX 01/29/2020, Udenyca added  Cycle 4 FOLFOX 02/12/2020, Udenyca held  Cycle 5 FOLFOX 02/26/2020, Udenyca  Cycle 6 FOLFOX 03/11/2020, Udenyca held  CT abdomen/pelvis 03/16/2020-stable diffuse liver lesions, stable strictured appearing ascending colon, possible pericolonic implant, vague left lower lobe nodule  Cycle 1 FOLFIRI/Avastin 04/06/2020  Cycle 2 FOLFIRI/Avastin 04/21/2020  Cycle 3 FOLFIRI/Avastin 05/06/2020  Cycle4FOLFIRI/Avastin 05/20/2020  Cycle 5 FOLFIRI/Avastin 06/03/2020  CTs 06/15/2020-mild to moderate improvement in hepatic metastasis.  No new or progressive disease.  Narrowing within the proximal ascending colon with upstream mild cecal and terminal ileum dilatation, similar.  Cycle 6 FOLFIRI/Avastin 06/17/2020  Cycle 7 FOLFIRI/Avastin 07/01/2020  Cycle 8 FOLFIRI/Avastin 07/15/2020  Cycle 9 FOLFIRI/Avastin 07/29/2020-5-FU bolus eliminated, 5-FU infusion and irinotecan dose reduced  Cycle 10 FOLFIRI/Avastin 08/12/2020 2. 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 Cirrhosis 4. 12/26/2019-hospital admission for right lower extremity DVT and GI bleed, treated with heparin anticoagulation beginning 12/26/2019, converted to Lovenox at discharge 12/29/2019 5. History of low-grade fever-likely "tumor"fever 6. COVID-19 infection January 2021  Disposition: Stacy Burns appears stable.  She will complete another cycle of FOLFIRI/Avastin today.  I encouraged her to use  moisturizers on the hands and feet.  She will increase the use of nutrition supplements.  She will change to Micro-K as a potassium supplement.  I refilled her prescription for oxycodone.  [name] will undergo a restaging CT prior to an office visit in 2 weeks.  She received a COVID-19 booster vaccine today.  [name], MD  08/12/2020  10:46 AM

## 2020-08-14 ENCOUNTER — Other Ambulatory Visit: Payer: Self-pay

## 2020-08-14 ENCOUNTER — Inpatient Hospital Stay: Payer: BC Managed Care – PPO | Attending: Oncology

## 2020-08-14 VITALS — BP 98/58 | HR 81 | Temp 98.5°F | Resp 17

## 2020-08-14 DIAGNOSIS — C787 Secondary malignant neoplasm of liver and intrahepatic bile duct: Secondary | ICD-10-CM | POA: Insufficient documentation

## 2020-08-14 DIAGNOSIS — D500 Iron deficiency anemia secondary to blood loss (chronic): Secondary | ICD-10-CM | POA: Insufficient documentation

## 2020-08-14 DIAGNOSIS — K7460 Unspecified cirrhosis of liver: Secondary | ICD-10-CM | POA: Insufficient documentation

## 2020-08-14 DIAGNOSIS — Z5189 Encounter for other specified aftercare: Secondary | ICD-10-CM | POA: Diagnosis not present

## 2020-08-14 DIAGNOSIS — Z5112 Encounter for antineoplastic immunotherapy: Secondary | ICD-10-CM | POA: Diagnosis not present

## 2020-08-14 DIAGNOSIS — Z86718 Personal history of other venous thrombosis and embolism: Secondary | ICD-10-CM | POA: Diagnosis not present

## 2020-08-14 DIAGNOSIS — Z5111 Encounter for antineoplastic chemotherapy: Secondary | ICD-10-CM | POA: Diagnosis present

## 2020-08-14 DIAGNOSIS — Z452 Encounter for adjustment and management of vascular access device: Secondary | ICD-10-CM | POA: Diagnosis not present

## 2020-08-14 DIAGNOSIS — C182 Malignant neoplasm of ascending colon: Secondary | ICD-10-CM | POA: Diagnosis present

## 2020-08-14 DIAGNOSIS — Z7901 Long term (current) use of anticoagulants: Secondary | ICD-10-CM | POA: Diagnosis not present

## 2020-08-14 MED ORDER — HEPARIN SOD (PORK) LOCK FLUSH 100 UNIT/ML IV SOLN
500.0000 [IU] | Freq: Once | INTRAVENOUS | Status: AC | PRN
  Administered 2020-08-14: 500 [IU]
  Filled 2020-08-14: qty 5

## 2020-08-14 MED ORDER — PEGFILGRASTIM-CBQV 6 MG/0.6ML ~~LOC~~ SOSY
6.0000 mg | PREFILLED_SYRINGE | Freq: Once | SUBCUTANEOUS | Status: AC
  Administered 2020-08-14: 6 mg via SUBCUTANEOUS

## 2020-08-14 MED ORDER — SODIUM CHLORIDE 0.9% FLUSH
10.0000 mL | INTRAVENOUS | Status: DC | PRN
  Administered 2020-08-14: 10 mL
  Filled 2020-08-14: qty 10

## 2020-08-14 NOTE — Patient Instructions (Signed)

## 2020-08-22 ENCOUNTER — Other Ambulatory Visit: Payer: Self-pay | Admitting: Oncology

## 2020-08-23 ENCOUNTER — Ambulatory Visit (HOSPITAL_COMMUNITY)
Admission: RE | Admit: 2020-08-23 | Discharge: 2020-08-23 | Disposition: A | Discharge status: Home / Self Care | Payer: BC Managed Care – PPO | Source: Ambulatory Visit | Attending: Oncology | Admitting: Oncology

## 2020-08-23 ENCOUNTER — Encounter (HOSPITAL_COMMUNITY): Payer: Self-pay

## 2020-08-23 ENCOUNTER — Other Ambulatory Visit: Payer: Self-pay

## 2020-08-23 DIAGNOSIS — C182 Malignant neoplasm of ascending colon: Secondary | ICD-10-CM | POA: Insufficient documentation

## 2020-08-23 MED ORDER — HEPARIN SOD (PORK) LOCK FLUSH 100 UNIT/ML IV SOLN
INTRAVENOUS | Status: AC
  Filled 2020-08-23: qty 5

## 2020-08-23 MED ORDER — IOHEXOL 300 MG/ML  SOLN
100.0000 mL | Freq: Once | INTRAMUSCULAR | Status: AC | PRN
  Administered 2020-08-23: 100 mL via INTRAVENOUS

## 2020-08-23 MED ORDER — HEPARIN SOD (PORK) LOCK FLUSH 100 UNIT/ML IV SOLN
500.0000 [IU] | Freq: Once | INTRAVENOUS | Status: AC
  Administered 2020-08-23: 500 [IU] via INTRAVENOUS

## 2020-08-24 ENCOUNTER — Other Ambulatory Visit: Payer: Self-pay | Admitting: Oncology

## 2020-08-25 ENCOUNTER — Encounter: Payer: Self-pay | Admitting: Nurse Practitioner

## 2020-08-25 ENCOUNTER — Inpatient Hospital Stay: Payer: BC Managed Care – PPO

## 2020-08-25 ENCOUNTER — Inpatient Hospital Stay (HOSPITAL_BASED_OUTPATIENT_CLINIC_OR_DEPARTMENT_OTHER): Payer: BC Managed Care – PPO | Admitting: Nurse Practitioner

## 2020-08-25 ENCOUNTER — Other Ambulatory Visit: Payer: Self-pay

## 2020-08-25 ENCOUNTER — Ambulatory Visit: Payer: BC Managed Care – PPO | Admitting: Oncology

## 2020-08-25 VITALS — BP 106/49 | HR 66 | Temp 97.4°F | Resp 16 | Ht 67.0 in | Wt 118.5 lb

## 2020-08-25 DIAGNOSIS — Z95828 Presence of other vascular implants and grafts: Secondary | ICD-10-CM

## 2020-08-25 DIAGNOSIS — Z5112 Encounter for antineoplastic immunotherapy: Secondary | ICD-10-CM | POA: Diagnosis not present

## 2020-08-25 DIAGNOSIS — C182 Malignant neoplasm of ascending colon: Secondary | ICD-10-CM | POA: Diagnosis not present

## 2020-08-25 LAB — CBC WITH DIFFERENTIAL (CANCER CENTER ONLY)
Abs Immature Granulocytes: 0.98 10*3/uL — ABNORMAL HIGH (ref 0.00–0.07)
Basophils Absolute: 0.1 10*3/uL (ref 0.0–0.1)
Basophils Relative: 1 %
Eosinophils Absolute: 0.2 10*3/uL (ref 0.0–0.5)
Eosinophils Relative: 2 %
HCT: 27.7 % — ABNORMAL LOW (ref 36.0–46.0)
Hemoglobin: 8.7 g/dL — ABNORMAL LOW (ref 12.0–15.0)
Immature Granulocytes: 10 %
Lymphocytes Relative: 12 %
Lymphs Abs: 1.2 10*3/uL (ref 0.7–4.0)
MCH: 33.9 pg (ref 26.0–34.0)
MCHC: 31.4 g/dL (ref 30.0–36.0)
MCV: 107.8 fL — ABNORMAL HIGH (ref 80.0–100.0)
Monocytes Absolute: 0.9 10*3/uL (ref 0.1–1.0)
Monocytes Relative: 9 %
Neutro Abs: 6.7 10*3/uL (ref 1.7–7.7)
Neutrophils Relative %: 66 %
Platelet Count: 182 10*3/uL (ref 150–400)
RBC: 2.57 MIL/uL — ABNORMAL LOW (ref 3.87–5.11)
RDW: 15.9 % — ABNORMAL HIGH (ref 11.5–15.5)
WBC Count: 10 10*3/uL (ref 4.0–10.5)
nRBC: 0 % (ref 0.0–0.2)

## 2020-08-25 LAB — CMP (CANCER CENTER ONLY)
ALT: 13 U/L (ref 0–44)
AST: 19 U/L (ref 15–41)
Albumin: 3.2 g/dL — ABNORMAL LOW (ref 3.5–5.0)
Alkaline Phosphatase: 233 U/L — ABNORMAL HIGH (ref 38–126)
Anion gap: 6 (ref 5–15)
BUN: 13 mg/dL (ref 8–23)
CO2: 23 mmol/L (ref 22–32)
Calcium: 9.1 mg/dL (ref 8.9–10.3)
Chloride: 110 mmol/L (ref 98–111)
Creatinine: 0.83 mg/dL (ref 0.44–1.00)
GFR, Estimated: >60 mL/min (ref >60)
Glucose, Bld: 86 mg/dL (ref 70–99)
Potassium: 3.9 mmol/L (ref 3.5–5.1)
Sodium: 139 mmol/L (ref 135–145)
Total Bilirubin: 0.3 mg/dL (ref 0.3–1.2)
Total Protein: 6 g/dL — ABNORMAL LOW (ref 6.5–8.1)

## 2020-08-25 LAB — TOTAL PROTEIN, URINE DIPSTICK: Protein, ur: 30 mg/dL — AB

## 2020-08-25 LAB — CEA (IN HOUSE-CHCC): CEA (CHCC-In House): 205.59 ng/mL — ABNORMAL HIGH (ref 0.00–5.00)

## 2020-08-25 MED ORDER — FLUOROURACIL CHEMO INJECTION 5 GM/100ML
1600.0000 mg/m2 | INTRAVENOUS | Status: DC
  Administered 2020-08-25: 2550 mg via INTRAVENOUS
  Filled 2020-08-25: qty 51

## 2020-08-25 MED ORDER — LEUCOVORIN CALCIUM INJECTION 350 MG
200.0000 mg/m2 | Freq: Once | INTRAMUSCULAR | Status: AC
  Administered 2020-08-25: 318 mg via INTRAVENOUS
  Filled 2020-08-25: qty 15.9

## 2020-08-25 MED ORDER — HEPARIN SOD (PORK) LOCK FLUSH 100 UNIT/ML IV SOLN
500.0000 [IU] | Freq: Once | INTRAVENOUS | Status: DC | PRN
  Filled 2020-08-25: qty 5

## 2020-08-25 MED ORDER — SODIUM CHLORIDE 0.9 % IV SOLN
Freq: Once | INTRAVENOUS | Status: AC
  Filled 2020-08-25: qty 250

## 2020-08-25 MED ORDER — ATROPINE SULFATE 1 MG/ML IJ SOLN
0.5000 mg | Freq: Once | INTRAMUSCULAR | Status: AC | PRN
  Administered 2020-08-25: 0.5 mg via INTRAVENOUS

## 2020-08-25 MED ORDER — SODIUM CHLORIDE 0.9 % IV SOLN
140.0000 mg/m2 | Freq: Once | INTRAVENOUS | Status: AC
  Administered 2020-08-25: 220 mg via INTRAVENOUS
  Filled 2020-08-25: qty 11

## 2020-08-25 MED ORDER — SODIUM CHLORIDE 0.9% FLUSH
10.0000 mL | Freq: Once | INTRAVENOUS | Status: AC
  Administered 2020-08-25: 10 mL
  Filled 2020-08-25: qty 10

## 2020-08-25 MED ORDER — SODIUM CHLORIDE 0.9% FLUSH
10.0000 mL | INTRAVENOUS | Status: DC | PRN
  Filled 2020-08-25: qty 10

## 2020-08-25 MED ORDER — ALBUTEROL SULFATE HFA 108 (90 BASE) MCG/ACT IN AERS: 2.0000 | INHALATION_SPRAY | Freq: Four times a day (QID) | RESPIRATORY_TRACT | 2 refills | Status: DC | PRN

## 2020-08-25 MED ORDER — PALONOSETRON HCL INJECTION 0.25 MG/5ML
INTRAVENOUS | Status: AC
  Filled 2020-08-25: qty 5

## 2020-08-25 MED ORDER — SODIUM CHLORIDE 0.9 % IV SOLN
10.0000 mg | Freq: Once | INTRAVENOUS | Status: AC
  Administered 2020-08-25: 10 mg via INTRAVENOUS
  Filled 2020-08-25: qty 10

## 2020-08-25 MED ORDER — ATROPINE SULFATE 1 MG/ML IJ SOLN
INTRAMUSCULAR | Status: AC
  Filled 2020-08-25: qty 1

## 2020-08-25 MED ORDER — SODIUM CHLORIDE 0.9 % IV SOLN
5.0000 mg/kg | Freq: Once | INTRAVENOUS | Status: AC
  Administered 2020-08-25: 275 mg via INTRAVENOUS
  Filled 2020-08-25: qty 11

## 2020-08-25 MED ORDER — PALONOSETRON HCL INJECTION 0.25 MG/5ML
0.2500 mg | Freq: Once | INTRAVENOUS | Status: AC
  Administered 2020-08-25: 0.25 mg via INTRAVENOUS

## 2020-08-25 NOTE — Patient Instructions (Addendum)
[facility] Discharge Instructions for Patients Receiving Chemotherapy  Today you received the following chemotherapy agents: irinotecan, bevacizumab.leucovorin, and fluorouracil.  To help prevent nausea and vomiting after your treatment, we encourage you to take your nausea medication as directed.   If you develop nausea and vomiting that is not controlled by your nausea medication, call the clinic.   BELOW ARE SYMPTOMS THAT SHOULD BE REPORTED IMMEDIATELY:  *FEVER GREATER THAN 100.5 F  *CHILLS WITH OR WITHOUT FEVER  NAUSEA AND VOMITING THAT IS NOT CONTROLLED WITH YOUR NAUSEA MEDICATION  *UNUSUAL SHORTNESS OF BREATH  *UNUSUAL BRUISING OR BLEEDING  TENDERNESS IN MOUTH AND THROAT WITH OR WITHOUT PRESENCE OF ULCERS  *URINARY PROBLEMS  *BOWEL PROBLEMS  UNUSUAL RASH Items with * indicate a potential emergency and should be followed up as soon as possible.  Feel free to call the clinic should you have any questions or concerns. The clinic phone number is [phone].  Please show the [name] at check-in to the Emergency Department and triage nurse.

## 2020-08-25 NOTE — Progress Notes (Addendum)
[place] OFFICE PROGRESS NOTE   Diagnosis: Colon cancer  INTERVAL HISTORY:   Stacy Burns returns as scheduled.  She completed cycle 10 FOLFIRI/Avastin 08/12/2020. She denies nausea/vomiting.  No mouth sores.  Diarrhea is controlled with Imodium.  No hand or foot pain or redness.  Feet feel cold.  She intermittently has blood with nose blowing, minor nosebleeds.  No other bleeding. Objective:  Vital signs in last 24 hours:  Blood pressure (!) 106/49, pulse 66, temperature (!) 97.4 F (36.3 C), temperature source Tympanic, resp. rate 16, height $RemoveBe'5\' 7"'SoxlKAyIE$  (1.702 m), weight 118 lb 8 oz (53.8 kg), SpO2 100 %.    HEENT: No thrush or ulcers. Resp: Lungs clear bilaterally. Cardio: Regular rate and rhythm. GI: Abdomen soft and nontender.  No thyromegaly. Vascular: No leg edema. Skin: Palms with hyperpigmentation, skin thickening. Port-A-Cath without erythema.   Lab Results:  Lab Results  Component Value Date   WBC 10.0 08/25/2020   HGB 8.7 (L) 08/25/2020   HCT 27.7 (L) 08/25/2020   MCV 107.8 (H) 08/25/2020   PLT 182 08/25/2020   NEUTROABS 6.7 08/25/2020    Imaging:  No results found.  Medications: I have reviewed the patient's current medications.  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 Cirrhosis. Innumerable liver lesions.  Colonoscopy 12/18/2019-ulcerated partially obstructing large mass in the mid ascending colon. Biopsy invasive adenocarcinoma; preserved expression major MMR proteins  Upper endoscopy 12/18/2019-normal esophagus, stomach, examined duodenum  CEA 12/19/2019-8,923  Biopsy liver lesion 12/23/2019-adenocarcinoma  Foundation 1-K-ras wild-type; tumor mutation burden greater than or equal to 10; microsatellite stable; BRAF V600E  Cycle 1 FOLFOX 01/01/2020  Cycle 2 FOLFOX 01/15/2020  Cycle 3 FOLFOX 01/29/2020, Udenyca  added  Cycle 4 FOLFOX 02/12/2020, Udenyca held  Cycle 5 FOLFOX 02/26/2020, Udenyca  Cycle 6 FOLFOX 03/11/2020, Udenyca held  CT abdomen/pelvis 03/16/2020-stable diffuse liver lesions, stable strictured appearing ascending colon, possible pericolonic implant, vague left lower lobe nodule  Cycle 1 FOLFIRI/Avastin 04/06/2020  Cycle 2 FOLFIRI/Avastin 04/21/2020  Cycle 3 FOLFIRI/Avastin 05/06/2020  Cycle4FOLFIRI/Avastin 05/20/2020  Cycle 5 FOLFIRI/Avastin 06/03/2020  CTs 06/15/2020-mild to moderate improvement in hepatic metastasis. No new or progressive disease. Narrowing within the proximal ascending colon with upstream mild cecal and terminal ileum dilatation, similar.  Cycle 6 FOLFIRI/Avastin 06/17/2020  Cycle 7 FOLFIRI/Avastin 07/01/2020  Cycle 8 FOLFIRI/Avastin 07/15/2020  Cycle 9 FOLFIRI/Avastin 07/29/2020-5-FU bolus eliminated, 5-FU infusion and irinotecan dose reduced  Cycle 10 FOLFIRI/Avastin 08/12/2020  CTs 08/23/2020- mild residual wall thickening involving the cecum/ascending colon.  Numerous liver metastases improved.  Cycle 11 FOLFIRI/Avastin 08/25/2020 2. 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 Cirrhosis 4. 12/26/2019-hospital admission for right lower extremity DVT and GI bleed, treated with heparin anticoagulation beginning 12/26/2019, converted to Lovenox at discharge 12/29/2019 5. History of low-grade fever-likely "tumor"fever 6. COVID-19 infection January 2021    Disposition: [name] appears stable. She has completed 10 cycles of FOLFIRI/Avastin. Recent restaging CT scan show continued improvement in liver metastases. Dr. Benay Spice reviewed the CT report/images with [name] at today's appointment and recommends continuation of FOLFIRI/Avastin, cycle 11 today. Subsequent treatments will be given on a 3-week schedule. She agrees with this plan. Chemotherapy doses adjusted today due to weight loss.  We reviewed the CBC from today. Counts adequate to  proceed with treatment.  She will return for lab, follow-up, FOLFIRI/Avastin in 3 weeks. She will contact the office in the  interim with any problems.  Patient seen with Dr. Benay Spice.    Ned Card ANP/GNP-BC   08/25/2020  12:04 PM [name] has completed 10 cycles of FOLFIRI/Avastin.  We discussed the restaging CT findings and reviewed the images with her.  The liver metastases appear smaller.  The CEA has decreased.  The plan is to continue FOLFIRI/Avastin.  Treatment will be changed to a 3-week schedule.  Chemotherapy was dose adjusted based on her weight loss.  Julieanne Manson, MD

## 2020-08-25 NOTE — Patient Instructions (Signed)

## 2020-08-26 ENCOUNTER — Telehealth: Payer: Self-pay | Admitting: Nurse Practitioner

## 2020-08-26 NOTE — Telephone Encounter (Signed)
Scheduled appointments per 1/12 los. Called patient, no answer. Left message with appointments dates and times.  

## 2020-08-27 ENCOUNTER — Inpatient Hospital Stay: Payer: BC Managed Care – PPO

## 2020-08-27 ENCOUNTER — Telehealth: Payer: Self-pay | Admitting: *Deleted

## 2020-08-27 ENCOUNTER — Other Ambulatory Visit: Payer: Self-pay

## 2020-08-27 VITALS — BP 102/46

## 2020-08-27 DIAGNOSIS — C182 Malignant neoplasm of ascending colon: Secondary | ICD-10-CM

## 2020-08-27 DIAGNOSIS — Z5112 Encounter for antineoplastic immunotherapy: Secondary | ICD-10-CM | POA: Diagnosis not present

## 2020-08-27 MED ORDER — PEGFILGRASTIM-CBQV 6 MG/0.6ML ~~LOC~~ SOSY
6.0000 mg | PREFILLED_SYRINGE | Freq: Once | SUBCUTANEOUS | Status: AC
  Administered 2020-08-27: 6 mg via SUBCUTANEOUS

## 2020-08-27 MED ORDER — PEGFILGRASTIM-CBQV 6 MG/0.6ML ~~LOC~~ SOSY
PREFILLED_SYRINGE | SUBCUTANEOUS | Status: AC
  Filled 2020-08-27: qty 0.6

## 2020-08-27 MED ORDER — HEPARIN SOD (PORK) LOCK FLUSH 100 UNIT/ML IV SOLN
500.0000 [IU] | Freq: Once | INTRAVENOUS | Status: AC | PRN
  Administered 2020-08-27: 500 [IU]
  Filled 2020-08-27: qty 5

## 2020-08-27 MED ORDER — SODIUM CHLORIDE 0.9% FLUSH
10.0000 mL | INTRAVENOUS | Status: DC | PRN
  Administered 2020-08-27: 10 mL
  Filled 2020-08-27: qty 10

## 2020-08-27 NOTE — Telephone Encounter (Signed)
08/25/2020:  Collaborative informed nurse of inquiry of form status.   Message left for nurse to notify patient of no pending forms or receipt of new forms for this patient. 08/27/2020: Unable to connect with Sherald Hess.  Message left with above information.  Asked bring form for today's appointment or contact (SW) H.I.M. if records needed for previous request.  Provided [phone] (SW) H.I.M phone number.

## 2020-08-27 NOTE — Patient Instructions (Signed)
Pegfilgrastim injection What is this medicine? PEGFILGRASTIM (PEG fil gra stim) is a long-acting granulocyte colony-stimulating factor that stimulates the growth of neutrophils, a type of white blood cell important in the body's fight against infection. It is used to reduce the incidence of fever and infection in patients with certain types of cancer who are receiving chemotherapy that affects the bone marrow, and to increase survival after being exposed to high doses of radiation. This medicine may be used for other purposes; ask your health care provider or pharmacist if you have questions. COMMON BRAND NAME(S): Fulphila, Neulasta, Nyvepria, UDENYCA, Ziextenzo What should I tell my health care provider before I take this medicine? They need to know if you have any of these conditions:  kidney disease  latex allergy  ongoing radiation therapy  sickle cell disease  skin reactions to acrylic adhesives (On-Body Injector only)  an unusual or allergic reaction to pegfilgrastim, filgrastim, other medicines, foods, dyes, or preservatives  pregnant or trying to get pregnant  breast-feeding How should I use this medicine? This medicine is for injection under the skin. If you get this medicine at home, you will be taught how to prepare and give the pre-filled syringe or how to use the On-body Injector. Refer to the patient Instructions for Use for detailed instructions. Use exactly as directed. Tell your healthcare provider immediately if you suspect that the On-body Injector may not have performed as intended or if you suspect the use of the On-body Injector resulted in a missed or partial dose. It is important that you put your used needles and syringes in a special sharps container. Do not put them in a trash can. If you do not have a sharps container, call your pharmacist or healthcare provider to get one. Talk to your pediatrician regarding the use of this medicine in children. While this drug  may be prescribed for selected conditions, precautions do apply. Overdosage: If you think you have taken too much of this medicine contact a poison control center or emergency room at once. NOTE: This medicine is only for you. Do not share this medicine with others. What if I miss a dose? It is important not to miss your dose. Call your doctor or health care professional if you miss your dose. If you miss a dose due to an On-body Injector failure or leakage, a new dose should be administered as soon as possible using a single prefilled syringe for manual use. What may interact with this medicine? Interactions have not been studied. This list may not describe all possible interactions. Give your health care provider a list of all the medicines, herbs, non-prescription drugs, or dietary supplements you use. Also tell them if you smoke, drink alcohol, or use illegal drugs. Some items may interact with your medicine. What should I watch for while using this medicine? Your condition will be monitored carefully while you are receiving this medicine. You may need blood work done while you are taking this medicine. Talk to your health care provider about your risk of cancer. You may be more at risk for certain types of cancer if you take this medicine. If you are going to need a MRI, CT scan, or other procedure, tell your doctor that you are using this medicine (On-Body Injector only). What side effects may I notice from receiving this medicine? Side effects that you should report to your doctor or health care professional as soon as possible:  allergic reactions (skin rash, itching or hives, swelling of   the face, lips, or tongue)  back pain  dizziness  fever  pain, redness, or irritation at site where injected  pinpoint red spots on the skin  red or dark-[place] urine  shortness of breath or breathing problems  stomach or side pain, or pain at the shoulder  swelling  tiredness  trouble  passing urine or change in the amount of urine  unusual bruising or bleeding Side effects that usually do not require medical attention (report to your doctor or health care professional if they continue or are bothersome):  bone pain  muscle pain This list may not describe all possible side effects. Call your doctor for medical advice about side effects. You may report side effects to FDA at 1-800-FDA-1088. Where should I keep my medicine? Keep out of the reach of children. If you are using this medicine at home, you will be instructed on how to store it. Throw away any unused medicine after the expiration date on the label. NOTE: This sheet is a summary. It may not cover all possible information. If you have questions about this medicine, talk to your doctor, pharmacist, or health care provider.  2021 Elsevier/Gold Standard (2019-08-22 13:20:51)  

## 2020-09-07 ENCOUNTER — Other Ambulatory Visit: Payer: Self-pay | Admitting: Oncology

## 2020-09-09 ENCOUNTER — Other Ambulatory Visit: Payer: Self-pay | Admitting: Oncology

## 2020-09-16 ENCOUNTER — Inpatient Hospital Stay: Payer: BC Managed Care – PPO | Admitting: Oncology

## 2020-09-16 ENCOUNTER — Inpatient Hospital Stay: Payer: BC Managed Care – PPO | Attending: Oncology

## 2020-09-16 ENCOUNTER — Inpatient Hospital Stay: Payer: BC Managed Care – PPO

## 2020-09-16 ENCOUNTER — Other Ambulatory Visit: Payer: Self-pay

## 2020-09-16 ENCOUNTER — Inpatient Hospital Stay: Payer: BC Managed Care – PPO | Admitting: Nutrition

## 2020-09-16 VITALS — BP 123/55 | HR 71 | Temp 98.7°F | Resp 18 | Ht 67.0 in | Wt 125.3 lb

## 2020-09-16 DIAGNOSIS — C182 Malignant neoplasm of ascending colon: Secondary | ICD-10-CM | POA: Insufficient documentation

## 2020-09-16 DIAGNOSIS — D500 Iron deficiency anemia secondary to blood loss (chronic): Secondary | ICD-10-CM | POA: Insufficient documentation

## 2020-09-16 DIAGNOSIS — Z86718 Personal history of other venous thrombosis and embolism: Secondary | ICD-10-CM | POA: Insufficient documentation

## 2020-09-16 DIAGNOSIS — C787 Secondary malignant neoplasm of liver and intrahepatic bile duct: Secondary | ICD-10-CM | POA: Diagnosis not present

## 2020-09-16 DIAGNOSIS — Z5189 Encounter for other specified aftercare: Secondary | ICD-10-CM | POA: Diagnosis not present

## 2020-09-16 DIAGNOSIS — K7460 Unspecified cirrhosis of liver: Secondary | ICD-10-CM | POA: Insufficient documentation

## 2020-09-16 DIAGNOSIS — Z5111 Encounter for antineoplastic chemotherapy: Secondary | ICD-10-CM | POA: Diagnosis present

## 2020-09-16 DIAGNOSIS — Z95828 Presence of other vascular implants and grafts: Secondary | ICD-10-CM

## 2020-09-16 DIAGNOSIS — Z5112 Encounter for antineoplastic immunotherapy: Secondary | ICD-10-CM | POA: Insufficient documentation

## 2020-09-16 DIAGNOSIS — Z452 Encounter for adjustment and management of vascular access device: Secondary | ICD-10-CM | POA: Insufficient documentation

## 2020-09-16 LAB — CBC WITH DIFFERENTIAL (CANCER CENTER ONLY)
Abs Immature Granulocytes: 0.09 10*3/uL — ABNORMAL HIGH (ref 0.00–0.07)
Basophils Absolute: 0.1 10*3/uL (ref 0.0–0.1)
Basophils Relative: 1 %
Eosinophils Absolute: 0.1 10*3/uL (ref 0.0–0.5)
Eosinophils Relative: 1 %
HCT: 25 % — ABNORMAL LOW (ref 36.0–46.0)
Hemoglobin: 7.7 g/dL — ABNORMAL LOW (ref 12.0–15.0)
Immature Granulocytes: 1 %
Lymphocytes Relative: 19 %
Lymphs Abs: 1.4 10*3/uL (ref 0.7–4.0)
MCH: 32.8 pg (ref 26.0–34.0)
MCHC: 30.8 g/dL (ref 30.0–36.0)
MCV: 106.4 fL — ABNORMAL HIGH (ref 80.0–100.0)
Monocytes Absolute: 0.9 10*3/uL (ref 0.1–1.0)
Monocytes Relative: 12 %
Neutro Abs: 4.8 10*3/uL (ref 1.7–7.7)
Neutrophils Relative %: 66 %
Platelet Count: 143 10*3/uL — ABNORMAL LOW (ref 150–400)
RBC: 2.35 MIL/uL — ABNORMAL LOW (ref 3.87–5.11)
RDW: 16.2 % — ABNORMAL HIGH (ref 11.5–15.5)
WBC Count: 7.3 10*3/uL (ref 4.0–10.5)
nRBC: 0 % (ref 0.0–0.2)

## 2020-09-16 LAB — CMP (CANCER CENTER ONLY)
ALT: 72 U/L — ABNORMAL HIGH (ref 0–44)
AST: 81 U/L — ABNORMAL HIGH (ref 15–41)
Albumin: 2.8 g/dL — ABNORMAL LOW (ref 3.5–5.0)
Alkaline Phosphatase: 266 U/L — ABNORMAL HIGH (ref 38–126)
Anion gap: 5 (ref 5–15)
BUN: 12 mg/dL (ref 8–23)
CO2: 26 mmol/L (ref 22–32)
Calcium: 8.5 mg/dL — ABNORMAL LOW (ref 8.9–10.3)
Chloride: 113 mmol/L — ABNORMAL HIGH (ref 98–111)
Creatinine: 0.63 mg/dL (ref 0.44–1.00)
GFR, Estimated: >60 mL/min (ref >60)
Glucose, Bld: 78 mg/dL (ref 70–99)
Potassium: 3.6 mmol/L (ref 3.5–5.1)
Sodium: 144 mmol/L (ref 135–145)
Total Bilirubin: 0.4 mg/dL (ref 0.3–1.2)
Total Protein: 5.5 g/dL — ABNORMAL LOW (ref 6.5–8.1)

## 2020-09-16 LAB — CEA (IN HOUSE-CHCC): CEA (CHCC-In House): 160.73 ng/mL — ABNORMAL HIGH (ref 0.00–5.00)

## 2020-09-16 MED ORDER — ATROPINE SULFATE 1 MG/ML IJ SOLN
INTRAMUSCULAR | Status: AC
  Filled 2020-09-16: qty 1

## 2020-09-16 MED ORDER — SODIUM CHLORIDE 0.9 % IV SOLN
Freq: Once | INTRAVENOUS | Status: AC
  Filled 2020-09-16: qty 250

## 2020-09-16 MED ORDER — PALONOSETRON HCL INJECTION 0.25 MG/5ML
0.2500 mg | Freq: Once | INTRAVENOUS | Status: AC
  Administered 2020-09-16: 0.25 mg via INTRAVENOUS

## 2020-09-16 MED ORDER — DEXAMETHASONE SODIUM PHOSPHATE 100 MG/10ML IJ SOLN
10.0000 mg | Freq: Once | INTRAMUSCULAR | Status: AC
  Administered 2020-09-16: 10 mg via INTRAVENOUS
  Filled 2020-09-16: qty 10

## 2020-09-16 MED ORDER — SODIUM CHLORIDE 0.9% FLUSH
10.0000 mL | Freq: Once | INTRAVENOUS | Status: AC
  Administered 2020-09-16: 10 mL
  Filled 2020-09-16: qty 10

## 2020-09-16 MED ORDER — PALONOSETRON HCL INJECTION 0.25 MG/5ML
INTRAVENOUS | Status: AC
  Filled 2020-09-16: qty 5

## 2020-09-16 MED ORDER — SODIUM CHLORIDE 0.9 % IV SOLN
200.0000 mg/m2 | Freq: Once | INTRAVENOUS | Status: AC
  Administered 2020-09-16: 318 mg via INTRAVENOUS
  Filled 2020-09-16: qty 15.9

## 2020-09-16 MED ORDER — SODIUM CHLORIDE 0.9 % IV SOLN
140.0000 mg/m2 | Freq: Once | INTRAVENOUS | Status: AC
  Administered 2020-09-16: 220 mg via INTRAVENOUS
  Filled 2020-09-16: qty 11

## 2020-09-16 MED ORDER — SODIUM CHLORIDE 0.9 % IV SOLN
5.0000 mg/kg | Freq: Once | INTRAVENOUS | Status: AC
  Administered 2020-09-16: 275 mg via INTRAVENOUS
  Filled 2020-09-16: qty 11

## 2020-09-16 MED ORDER — ATROPINE SULFATE 1 MG/ML IJ SOLN
0.5000 mg | Freq: Once | INTRAMUSCULAR | Status: AC | PRN
  Administered 2020-09-16: 0.5 mg via INTRAVENOUS

## 2020-09-16 MED ORDER — SODIUM CHLORIDE 0.9 % IV SOLN
1600.0000 mg/m2 | INTRAVENOUS | Status: DC
  Administered 2020-09-16: 2550 mg via INTRAVENOUS
  Filled 2020-09-16: qty 51

## 2020-09-16 MED ORDER — SODIUM CHLORIDE 0.9% FLUSH
10.0000 mL | INTRAVENOUS | Status: DC | PRN
  Filled 2020-09-16: qty 10

## 2020-09-16 NOTE — Progress Notes (Signed)
Nutrition follow-up completed with patient during infusion for metastatic colon cancer. Weight improved to 125.3 pounds on February 3. She reports her appetite is better and her diarrhea has improved.  She still uses Imodium when needed. She has increased the variety of food she is eating and mentions she tolerates spaghetti, hamburgers, hot dogs, and boost original.  Nutrition diagnosis: Increased nutrient needs continue.  Intervention: Patient educated to continue strategies for increased calories and protein. Encourage patient to continue to increase variety to provide additional calories and protein. Continue boost to-3 cartons daily.  Monitoring, evaluation, goals: Patient will tolerate increased calories and protein to minimize weight loss  Next visit: To be scheduled with treatment as needed.  Patient has my contact information for questions or concerns.  **Disclaimer: This note was dictated with voice recognition software. Similar sounding words can inadvertently be transcribed and this note may contain transcription errors which may not have been corrected upon publication of note.**

## 2020-09-16 NOTE — Patient Instructions (Signed)
[facility] Discharge Instructions for Patients Receiving Chemotherapy  Today you received the following chemotherapy agents: bevacizumab-bvzr [name]), irinotecan, leucovorin, fluorouracil.  To help prevent nausea and vomiting after your treatment, we encourage you to take your nausea medication as directed.   If you develop nausea and vomiting that is not controlled by your nausea medication, call the clinic.   BELOW ARE SYMPTOMS THAT SHOULD BE REPORTED IMMEDIATELY:  *FEVER GREATER THAN 100.5 F  *CHILLS WITH OR WITHOUT FEVER  NAUSEA AND VOMITING THAT IS NOT CONTROLLED WITH YOUR NAUSEA MEDICATION  *UNUSUAL SHORTNESS OF BREATH  *UNUSUAL BRUISING OR BLEEDING  TENDERNESS IN MOUTH AND THROAT WITH OR WITHOUT PRESENCE OF ULCERS  *URINARY PROBLEMS  *BOWEL PROBLEMS  UNUSUAL RASH Items with * indicate a potential emergency and should be followed up as soon as possible.  Feel free to call the clinic should you have any questions or concerns. The clinic phone number is [phone].  Please show the [place] at check-in to the Emergency Department and triage nurse.

## 2020-09-16 NOTE — Progress Notes (Signed)
[facility] OFFICE PROGRESS NOTE   Diagnosis: Colon cancer  INTERVAL HISTORY:   Stacy Burns completed another cycle of FOLFIRI/Avastin on 08/25/2020.  She reports mild nosebleeding.  She had an episode of rectal bleeding within a few days of completing chemotherapy.  No other bleeding.  She continues Lovenox.  She has developed an abdominal wall "knot "at injection site.  Her appetite has improved.  She had less diarrhea following the last cycle of chemotherapy.  Objective:  Vital signs in last 24 hours:  Blood pressure (!) 123/55, pulse 71, temperature 98.7 F (37.1 C), temperature source Tympanic, resp. rate 18, height 5\' 7"  (1.702 m), weight 125 lb 4.8 oz (56.8 kg), SpO2 100 %.    HEENT: No thrush or ulcers Resp: Lungs clear bilaterally Cardio: Regular rate and rhythm GI: No hepatomegaly, nodular induration at a left mid abdomen injection site, similar smaller nodular areas at injection sites Vascular: No leg edema   Portacath/PICC-without erythema  Lab Results:  Lab Results  Component Value Date   WBC 7.3 09/16/2020   HGB 7.7 (L) 09/16/2020   HCT 25.0 (L) 09/16/2020   MCV 106.4 (H) 09/16/2020   PLT 143 (L) 09/16/2020   NEUTROABS 4.8 09/16/2020    CMP  Lab Results  Component Value Date   NA 139 08/25/2020   K 3.9 08/25/2020   CL 110 08/25/2020   CO2 23 08/25/2020   GLUCOSE 86 08/25/2020   BUN 13 08/25/2020   CREATININE 0.83 08/25/2020   CALCIUM 9.1 08/25/2020   PROT 6.0 (L) 08/25/2020   ALBUMIN 3.2 (L) 08/25/2020   AST 19 08/25/2020   ALT 13 08/25/2020   ALKPHOS 233 (H) 08/25/2020   BILITOT 0.3 08/25/2020   GFRNONAA >60 08/25/2020   GFRAA >60 05/06/2020    Lab Results  Component Value Date   CEA1 205.59 (H) 08/25/2020     Medications: I have reviewed the patient's current medications.   Assessment/Plan: 1. 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 Cirrhosis. Innumerable liver lesions.  Colonoscopy 12/18/2019-ulcerated partially obstructing large mass in the mid ascending colon. Biopsy invasive adenocarcinoma; preserved expression major MMR proteins  Upper endoscopy 12/18/2019-normal esophagus, stomach, examined duodenum  CEA 12/19/2019-8,923  Biopsy liver lesion 12/23/2019-adenocarcinoma  Foundation 1-K-ras wild-type; tumor mutation burden greater than or equal to 10; microsatellite stable; BRAF V600E  Cycle 1 FOLFOX 01/01/2020  Cycle 2 FOLFOX 01/15/2020  Cycle 3 FOLFOX 01/29/2020, Udenyca added  Cycle 4 FOLFOX 02/12/2020, Udenyca held  Cycle 5 FOLFOX 02/26/2020, Udenyca  Cycle 6 FOLFOX 03/11/2020, Udenyca held  CT abdomen/pelvis 03/16/2020-stable diffuse liver lesions, stable strictured appearing ascending colon, possible pericolonic implant, vague left lower lobe nodule  Cycle 1 FOLFIRI/Avastin 04/06/2020  Cycle 2 FOLFIRI/Avastin 04/21/2020  Cycle 3 FOLFIRI/Avastin 05/06/2020  Cycle4FOLFIRI/Avastin 05/20/2020  Cycle 5 FOLFIRI/Avastin 06/03/2020  CTs 06/15/2020-mild to moderate improvement in hepatic metastasis. No new or progressive disease. Narrowing within the proximal ascending colon with upstream mild cecal and terminal ileum dilatation, similar.  Cycle 6 FOLFIRI/Avastin 06/17/2020  Cycle 7 FOLFIRI/Avastin 07/01/2020  Cycle 8 FOLFIRI/Avastin 07/15/2020  Cycle 9 FOLFIRI/Avastin 07/29/2020-5-FU bolus eliminated, 5-FU infusion and irinotecan dose reduced  Cycle 10 FOLFIRI/Avastin 08/12/2020  CTs 08/23/2020- mild residual wall thickening involving the cecum/ascending colon.  Numerous liver metastases improved.  Cycle 11 FOLFIRI/Avastin 08/25/2020  Cycle 12 FOLFIRI/Avastin 09/16/2020 2. 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 Cirrhosis 4. 12/26/2019-hospital admission for  right lower extremity DVT and GI bleed, treated with heparin anticoagulation beginning 12/26/2019, converted to Lovenox  at discharge 12/29/2019 5. History of low-grade fever-likely "tumor"fever 6. COVID-19 infection January 2021      Disposition: Stacy Burns appears unchanged.  The CEA was lower when she was here on 08/25/2020.  She will complete another cycle of FOLFIRI/Avastin today.  The hemoglobin is lower.  This is likely related to chemotherapy.  She will call for increased bleeding or symptoms of anemia.  Stacy Burns will rotate abdominal wall Lovenox injection sites.  She will call for increased erythema at the current area of induration.  She will return for an office visit and chemotherapy in 3 weeks.  [name], MD  09/16/2020  8:29 AM

## 2020-09-16 NOTE — Progress Notes (Signed)
Per Dr. Benay Spice, okay to treat with Hgb 7.7 and AST 81.

## 2020-09-17 ENCOUNTER — Telehealth: Payer: Self-pay | Admitting: Oncology

## 2020-09-17 NOTE — Telephone Encounter (Signed)
Scheduled appointments per 2/3 los. Called patient, no answer. Left message with appointments dates and times.  

## 2020-09-18 ENCOUNTER — Other Ambulatory Visit: Payer: Self-pay

## 2020-09-18 ENCOUNTER — Inpatient Hospital Stay: Payer: BC Managed Care – PPO

## 2020-09-18 VITALS — BP 120/56 | HR 68 | Temp 97.5°F | Resp 19

## 2020-09-18 DIAGNOSIS — Z5112 Encounter for antineoplastic immunotherapy: Secondary | ICD-10-CM | POA: Diagnosis not present

## 2020-09-18 MED ORDER — HEPARIN SOD (PORK) LOCK FLUSH 100 UNIT/ML IV SOLN
500.0000 [IU] | Freq: Once | INTRAVENOUS | Status: DC | PRN
  Filled 2020-09-18: qty 5

## 2020-09-18 MED ORDER — PEGFILGRASTIM-CBQV 6 MG/0.6ML ~~LOC~~ SOSY
6.0000 mg | PREFILLED_SYRINGE | Freq: Once | SUBCUTANEOUS | Status: AC
  Administered 2020-09-18: 6 mg via SUBCUTANEOUS

## 2020-09-18 MED ORDER — SODIUM CHLORIDE 0.9% FLUSH
10.0000 mL | INTRAVENOUS | Status: DC | PRN
  Administered 2020-09-18: 10 mL
  Filled 2020-09-18: qty 10

## 2020-10-02 ENCOUNTER — Other Ambulatory Visit: Payer: Self-pay | Admitting: Oncology

## 2020-10-07 ENCOUNTER — Telehealth: Payer: Self-pay

## 2020-10-07 ENCOUNTER — Other Ambulatory Visit: Payer: Self-pay

## 2020-10-07 ENCOUNTER — Inpatient Hospital Stay: Payer: BC Managed Care – PPO

## 2020-10-07 ENCOUNTER — Encounter: Payer: Self-pay | Admitting: Nurse Practitioner

## 2020-10-07 ENCOUNTER — Inpatient Hospital Stay (HOSPITAL_BASED_OUTPATIENT_CLINIC_OR_DEPARTMENT_OTHER): Payer: BC Managed Care – PPO | Admitting: Nurse Practitioner

## 2020-10-07 ENCOUNTER — Other Ambulatory Visit: Payer: Self-pay | Admitting: Nurse Practitioner

## 2020-10-07 VITALS — BP 120/53 | HR 73 | Temp 97.7°F | Resp 20 | Ht 67.0 in | Wt 124.4 lb

## 2020-10-07 VITALS — BP 122/52 | HR 64 | Temp 98.5°F | Resp 16

## 2020-10-07 DIAGNOSIS — C182 Malignant neoplasm of ascending colon: Secondary | ICD-10-CM

## 2020-10-07 DIAGNOSIS — D649 Anemia, unspecified: Secondary | ICD-10-CM

## 2020-10-07 DIAGNOSIS — Z5112 Encounter for antineoplastic immunotherapy: Secondary | ICD-10-CM | POA: Diagnosis not present

## 2020-10-07 LAB — CBC WITH DIFFERENTIAL (CANCER CENTER ONLY)
Abs Immature Granulocytes: 0.03 10*3/uL (ref 0.00–0.07)
Basophils Absolute: 0.1 10*3/uL (ref 0.0–0.1)
Basophils Relative: 1 %
Eosinophils Absolute: 0.2 10*3/uL (ref 0.0–0.5)
Eosinophils Relative: 3 %
HCT: 28 % — ABNORMAL LOW (ref 36.0–46.0)
Hemoglobin: 8.9 g/dL — ABNORMAL LOW (ref 12.0–15.0)
Immature Granulocytes: 1 %
Lymphocytes Relative: 20 %
Lymphs Abs: 1.3 10*3/uL (ref 0.7–4.0)
MCH: 33.2 pg (ref 26.0–34.0)
MCHC: 31.8 g/dL (ref 30.0–36.0)
MCV: 104.5 fL — ABNORMAL HIGH (ref 80.0–100.0)
Monocytes Absolute: 0.8 10*3/uL (ref 0.1–1.0)
Monocytes Relative: 13 %
Neutro Abs: 4 10*3/uL (ref 1.7–7.7)
Neutrophils Relative %: 62 %
Platelet Count: 134 10*3/uL — ABNORMAL LOW (ref 150–400)
RBC: 2.68 MIL/uL — ABNORMAL LOW (ref 3.87–5.11)
RDW: 15.8 % — ABNORMAL HIGH (ref 11.5–15.5)
WBC Count: 6.4 10*3/uL (ref 4.0–10.5)
nRBC: 0 % (ref 0.0–0.2)

## 2020-10-07 LAB — CMP (CANCER CENTER ONLY)
ALT: 59 U/L — ABNORMAL HIGH (ref 0–44)
AST: 62 U/L — ABNORMAL HIGH (ref 15–41)
Albumin: 3 g/dL — ABNORMAL LOW (ref 3.5–5.0)
Alkaline Phosphatase: 249 U/L — ABNORMAL HIGH (ref 38–126)
Anion gap: 7 (ref 5–15)
BUN: 15 mg/dL (ref 8–23)
CO2: 23 mmol/L (ref 22–32)
Calcium: 8.7 mg/dL — ABNORMAL LOW (ref 8.9–10.3)
Chloride: 111 mmol/L (ref 98–111)
Creatinine: 0.65 mg/dL (ref 0.44–1.00)
GFR, Estimated: >60 mL/min (ref >60)
Glucose, Bld: 79 mg/dL (ref 70–99)
Potassium: 3.6 mmol/L (ref 3.5–5.1)
Sodium: 141 mmol/L (ref 135–145)
Total Bilirubin: 0.4 mg/dL (ref 0.3–1.2)
Total Protein: 5.9 g/dL — ABNORMAL LOW (ref 6.5–8.1)

## 2020-10-07 LAB — CEA (IN HOUSE-CHCC): CEA (CHCC-In House): 127.47 ng/mL — ABNORMAL HIGH (ref 0.00–5.00)

## 2020-10-07 LAB — SAMPLE TO BLOOD BANK

## 2020-10-07 LAB — TOTAL PROTEIN, URINE DIPSTICK: Protein, ur: 30 mg/dL — AB

## 2020-10-07 MED ORDER — PALONOSETRON HCL INJECTION 0.25 MG/5ML
0.2500 mg | Freq: Once | INTRAVENOUS | Status: AC
  Administered 2020-10-07: 0.25 mg via INTRAVENOUS

## 2020-10-07 MED ORDER — SODIUM CHLORIDE 0.9 % IV SOLN
Freq: Once | INTRAVENOUS | Status: AC
  Filled 2020-10-07: qty 250

## 2020-10-07 MED ORDER — ATROPINE SULFATE 1 MG/ML IJ SOLN
0.5000 mg | Freq: Once | INTRAMUSCULAR | Status: AC | PRN
  Administered 2020-10-07: 0.5 mg via INTRAVENOUS

## 2020-10-07 MED ORDER — PALONOSETRON HCL INJECTION 0.25 MG/5ML
INTRAVENOUS | Status: AC
  Filled 2020-10-07: qty 5

## 2020-10-07 MED ORDER — SODIUM CHLORIDE 0.9 % IV SOLN
200.0000 mg/m2 | Freq: Once | INTRAVENOUS | Status: AC
  Administered 2020-10-07: 318 mg via INTRAVENOUS
  Filled 2020-10-07: qty 15.9

## 2020-10-07 MED ORDER — OXYCODONE-ACETAMINOPHEN 5-325 MG PO TABS: 1.0000 | ORAL_TABLET | ORAL | 0 refills | Status: DC | PRN

## 2020-10-07 MED ORDER — ATROPINE SULFATE 0.4 MG/ML IJ SOLN
INTRAMUSCULAR | Status: AC
  Filled 2020-10-07: qty 1

## 2020-10-07 MED ORDER — SODIUM CHLORIDE 0.9 % IV SOLN
5.0000 mg/kg | Freq: Once | INTRAVENOUS | Status: AC
  Administered 2020-10-07: 275 mg via INTRAVENOUS
  Filled 2020-10-07: qty 11

## 2020-10-07 MED ORDER — SODIUM CHLORIDE 0.9 % IV SOLN
10.0000 mg | Freq: Once | INTRAVENOUS | Status: AC
  Administered 2020-10-07: 10 mg via INTRAVENOUS
  Filled 2020-10-07: qty 10

## 2020-10-07 MED ORDER — ATROPINE SULFATE 1 MG/ML IJ SOLN
INTRAMUSCULAR | Status: AC
  Filled 2020-10-07: qty 1

## 2020-10-07 MED ORDER — SODIUM CHLORIDE 0.9 % IV SOLN
140.0000 mg/m2 | Freq: Once | INTRAVENOUS | Status: AC
  Administered 2020-10-07: 220 mg via INTRAVENOUS
  Filled 2020-10-07: qty 11

## 2020-10-07 MED ORDER — SODIUM CHLORIDE 0.9% FLUSH
10.0000 mL | INTRAVENOUS | Status: DC | PRN
  Filled 2020-10-07: qty 10

## 2020-10-07 MED ORDER — SODIUM CHLORIDE 0.9 % IV SOLN
1600.0000 mg/m2 | INTRAVENOUS | Status: DC
  Administered 2020-10-07: 2550 mg via INTRAVENOUS
  Filled 2020-10-07: qty 51

## 2020-10-07 NOTE — Patient Instructions (Signed)
[facility] Discharge Instructions for Patients Receiving Chemotherapy  Today you received the following immunotherapy agent: Bevacizumab and chemotherapy agents: Irinotecan, Leucovorin, Fluorouracil  To help prevent nausea and vomiting after your treatment, we encourage you to take your nausea medication as directed by your MD.   If you develop nausea and vomiting that is not controlled by your nausea medication, call the clinic.   BELOW ARE SYMPTOMS THAT SHOULD BE REPORTED IMMEDIATELY:  *FEVER GREATER THAN 100.5 F  *CHILLS WITH OR WITHOUT FEVER  NAUSEA AND VOMITING THAT IS NOT CONTROLLED WITH YOUR NAUSEA MEDICATION  *UNUSUAL SHORTNESS OF BREATH  *UNUSUAL BRUISING OR BLEEDING  TENDERNESS IN MOUTH AND THROAT WITH OR WITHOUT PRESENCE OF ULCERS  *URINARY PROBLEMS  *BOWEL PROBLEMS  UNUSUAL RASH Items with * indicate a potential emergency and should be followed up as soon as possible.  Feel free to call the clinic should you have any questions or concerns. The clinic phone number is [phone].  Please show the [place] at check-in to the Emergency Department and triage nurse.  [place] [facility] Discharge Instructions for Patients receiving Home Portable Chemo Pump    **The bag should finish at 46 hours, 96 hours or 7 days. For example, if your pump is scheduled for 46 hours and it was put on at 4pm, it should finish at 2 pm the day it is scheduled to come off regardless of your appointment time.    Estimated time to finish   _________________________ (Have your nurse fill in)     ** if the display on your pump reads "Low Volume" and it is beeping, take the batteries out of the pump and come to the cancer center for it to be taken off.   **If the pump alarms go off prior to the pump reading "Low Volume" then call the [phone] and someone can assist you.  **If the plunger comes out and the bag fluid is running out, please use your  chemo spill kit to clean up the spill. Do not use paper towels or other house hold products.  ** If you have problems or questions regarding your pump, please call either the [phone] or the cancer center Monday-Friday 8:00am-4:30pm at [phone] and we will assist you.  If you are unable to get assistance then go to [facility] Emergency Room, ask the staff to contact the IV team for assistance.

## 2020-10-07 NOTE — Telephone Encounter (Signed)
[facility] left a message advising they have moved to a new location and do not have their permit to dispense narcotics yet, so the Oxycodone rx will need to be redirected at this time.  I have left a message for the pt requesting a return call with the name and/or location of another pharmacy of her choice to have her rx sent to.

## 2020-10-07 NOTE — Progress Notes (Signed)
[place] OFFICE PROGRESS NOTE   Diagnosis: Colon cancer  INTERVAL HISTORY:   Stacy Burns returns as scheduled. She completed another cycle of FOLFIRI/Avastin 09/16/2020.  She denies nausea/vomiting.  No mouth sores.  She had diarrhea for about 3 days, controlled with Imodium.  She feels fatigued.  No bleeding.  She has intermittent upper abdominal pain.  She takes Percocet as needed, not daily.  Objective:  Vital signs in last 24 hours:  Blood pressure (!) 120/53, pulse 73, temperature 97.7 F (36.5 C), temperature source Tympanic, resp. rate 20, height $RemoveBe'5\' 7"'vhbWhoJzy$  (1.702 m), weight 124 lb 6.4 oz (56.4 kg), SpO2 100 %.    HEENT: No thrush or ulcers. Resp: Lungs clear bilaterally. Cardio: Regular rate and rhythm. GI: Abdomen soft and nontender.  No hepatomegaly.  No mass. Vascular: No leg edema.  Port-A-Cath without erythema.  Lab Results:  Lab Results  Component Value Date   WBC 6.4 10/07/2020   HGB 8.9 (L) 10/07/2020   HCT 28.0 (L) 10/07/2020   MCV 104.5 (H) 10/07/2020   PLT 134 (L) 10/07/2020   NEUTROABS 4.0 10/07/2020    Imaging:  No results found.  Medications: I have reviewed the patient's current medications.  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 Cirrhosis. Innumerable liver lesions.  Colonoscopy 12/18/2019-ulcerated partially obstructing large mass in the mid ascending colon. Biopsy invasive adenocarcinoma; preserved expression major MMR proteins  Upper endoscopy 12/18/2019-normal esophagus, stomach, examined duodenum  CEA 12/19/2019-8,923  Biopsy liver lesion 12/23/2019-adenocarcinoma  Foundation 1-K-ras wild-type; tumor mutation burden greater than or equal to 10; microsatellite stable; BRAF V600E  Cycle 1 FOLFOX 01/01/2020  Cycle 2 FOLFOX 01/15/2020  Cycle 3 FOLFOX 01/29/2020, Udenyca added  Cycle 4 FOLFOX  02/12/2020, Udenyca held  Cycle 5 FOLFOX 02/26/2020, Udenyca  Cycle 6 FOLFOX 03/11/2020, Udenyca held  CT abdomen/pelvis 03/16/2020-stable diffuse liver lesions, stable strictured appearing ascending colon, possible pericolonic implant, vague left lower lobe nodule  Cycle 1 FOLFIRI/Avastin 04/06/2020  Cycle 2 FOLFIRI/Avastin 04/21/2020  Cycle 3 FOLFIRI/Avastin 05/06/2020  Cycle4FOLFIRI/Avastin 05/20/2020  Cycle 5 FOLFIRI/Avastin 06/03/2020  CTs 06/15/2020-mild to moderate improvement in hepatic metastasis. No new or progressive disease. Narrowing within the proximal ascending colon with upstream mild cecal and terminal ileum dilatation, similar.  Cycle 6 FOLFIRI/Avastin 06/17/2020  Cycle 7 FOLFIRI/Avastin 07/01/2020  Cycle 8 FOLFIRI/Avastin 07/15/2020  Cycle 9 FOLFIRI/Avastin 07/29/2020-5-FU bolus eliminated, 5-FU infusion and irinotecan dose reduced  Cycle 10 FOLFIRI/Avastin 08/12/2020  CTs 08/23/2020- mild residual wall thickening involving the cecum/ascending colon.  Numerous liver metastases improved.  Cycle 11 FOLFIRI/Avastin 08/25/2020  Cycle 12 FOLFIRI/Avastin 09/16/2020  Cycle 13 FOLFIRI/Avastin 10/07/2020 2. 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 Cirrhosis 4. 12/26/2019-hospital admission for right lower extremity DVT and GI bleed, treated with heparin anticoagulation beginning 12/26/2019, converted to Lovenox at discharge 12/29/2019 5. History of low-grade fever-likely "tumor"fever 6. COVID-19 infection January 2021   Disposition: Stacy Burns appears stable.  She has completed 12 cycles of FOLFIRI/Avastin.  Most recent CEA further improved.  No clinical evidence of disease progression.  Plan to proceed with cycle 13 today as scheduled.  We reviewed the CBC from today.  Counts adequate to proceed with treatment.  Hemoglobin improved as compared to 3 weeks ago.  She will return for lab, follow-up, FOLFIRI/Avastin in 3 weeks.  She will contact the office in the  interim with any problems.  We specifically discussed  signs/symptoms suggestive of progressive anemia.    Ned Card ANP/GNP-BC   10/07/2020  11:49 AM

## 2020-10-08 ENCOUNTER — Telehealth: Payer: Self-pay | Admitting: Nurse Practitioner

## 2020-10-08 NOTE — Telephone Encounter (Signed)
Scheduled appointments per 2/24 los. Spoke to patient who is aware of appointments date and times.

## 2020-10-09 ENCOUNTER — Inpatient Hospital Stay: Payer: BC Managed Care – PPO

## 2020-10-09 ENCOUNTER — Other Ambulatory Visit: Payer: Self-pay

## 2020-10-09 VITALS — BP 116/46 | HR 71 | Temp 97.1°F | Resp 18

## 2020-10-09 DIAGNOSIS — Z5112 Encounter for antineoplastic immunotherapy: Secondary | ICD-10-CM | POA: Diagnosis not present

## 2020-10-09 DIAGNOSIS — C182 Malignant neoplasm of ascending colon: Secondary | ICD-10-CM

## 2020-10-09 MED ORDER — SODIUM CHLORIDE 0.9% FLUSH
10.0000 mL | INTRAVENOUS | Status: DC | PRN
  Administered 2020-10-09: 10 mL
  Filled 2020-10-09: qty 10

## 2020-10-09 MED ORDER — HEPARIN SOD (PORK) LOCK FLUSH 100 UNIT/ML IV SOLN
500.0000 [IU] | Freq: Once | INTRAVENOUS | Status: AC | PRN
  Administered 2020-10-09: 500 [IU]
  Filled 2020-10-09: qty 5

## 2020-10-09 MED ORDER — PEGFILGRASTIM-CBQV 6 MG/0.6ML ~~LOC~~ SOSY
6.0000 mg | PREFILLED_SYRINGE | Freq: Once | SUBCUTANEOUS | Status: AC
Start: 2020-10-09 — End: 2020-10-09
  Administered 2020-10-09: 6 mg via SUBCUTANEOUS

## 2020-10-24 ENCOUNTER — Other Ambulatory Visit: Payer: Self-pay | Admitting: Oncology

## 2020-10-28 ENCOUNTER — Inpatient Hospital Stay (HOSPITAL_BASED_OUTPATIENT_CLINIC_OR_DEPARTMENT_OTHER): Payer: BC Managed Care – PPO | Admitting: Oncology

## 2020-10-28 ENCOUNTER — Inpatient Hospital Stay: Payer: BC Managed Care – PPO | Admitting: Nutrition

## 2020-10-28 ENCOUNTER — Inpatient Hospital Stay: Payer: BC Managed Care – PPO | Attending: Oncology

## 2020-10-28 ENCOUNTER — Inpatient Hospital Stay: Payer: BC Managed Care – PPO

## 2020-10-28 ENCOUNTER — Other Ambulatory Visit: Payer: Self-pay

## 2020-10-28 VITALS — BP 122/53 | HR 78 | Temp 98.2°F | Resp 18 | Ht 67.0 in | Wt 120.4 lb

## 2020-10-28 DIAGNOSIS — C787 Secondary malignant neoplasm of liver and intrahepatic bile duct: Secondary | ICD-10-CM | POA: Diagnosis not present

## 2020-10-28 DIAGNOSIS — D500 Iron deficiency anemia secondary to blood loss (chronic): Secondary | ICD-10-CM | POA: Insufficient documentation

## 2020-10-28 DIAGNOSIS — K922 Gastrointestinal hemorrhage, unspecified: Secondary | ICD-10-CM | POA: Insufficient documentation

## 2020-10-28 DIAGNOSIS — Z5189 Encounter for other specified aftercare: Secondary | ICD-10-CM | POA: Diagnosis not present

## 2020-10-28 DIAGNOSIS — Z7901 Long term (current) use of anticoagulants: Secondary | ICD-10-CM | POA: Diagnosis not present

## 2020-10-28 DIAGNOSIS — Z5112 Encounter for antineoplastic immunotherapy: Secondary | ICD-10-CM | POA: Diagnosis present

## 2020-10-28 DIAGNOSIS — Z86718 Personal history of other venous thrombosis and embolism: Secondary | ICD-10-CM | POA: Insufficient documentation

## 2020-10-28 DIAGNOSIS — C182 Malignant neoplasm of ascending colon: Secondary | ICD-10-CM

## 2020-10-28 DIAGNOSIS — Z5111 Encounter for antineoplastic chemotherapy: Secondary | ICD-10-CM | POA: Insufficient documentation

## 2020-10-28 DIAGNOSIS — Z452 Encounter for adjustment and management of vascular access device: Secondary | ICD-10-CM | POA: Insufficient documentation

## 2020-10-28 DIAGNOSIS — Z95828 Presence of other vascular implants and grafts: Secondary | ICD-10-CM

## 2020-10-28 LAB — CBC WITH DIFFERENTIAL (CANCER CENTER ONLY)
Abs Immature Granulocytes: 0.06 10*3/uL (ref 0.00–0.07)
Basophils Absolute: 0.1 10*3/uL (ref 0.0–0.1)
Basophils Relative: 1 %
Eosinophils Absolute: 0.2 10*3/uL (ref 0.0–0.5)
Eosinophils Relative: 2 %
HCT: 28.2 % — ABNORMAL LOW (ref 36.0–46.0)
Hemoglobin: 8.9 g/dL — ABNORMAL LOW (ref 12.0–15.0)
Immature Granulocytes: 1 %
Lymphocytes Relative: 22 %
Lymphs Abs: 1.5 10*3/uL (ref 0.7–4.0)
MCH: 32.8 pg (ref 26.0–34.0)
MCHC: 31.6 g/dL (ref 30.0–36.0)
MCV: 104.1 fL — ABNORMAL HIGH (ref 80.0–100.0)
Monocytes Absolute: 0.8 10*3/uL (ref 0.1–1.0)
Monocytes Relative: 12 %
Neutro Abs: 4.3 10*3/uL (ref 1.7–7.7)
Neutrophils Relative %: 62 %
Platelet Count: 150 10*3/uL (ref 150–400)
RBC: 2.71 MIL/uL — ABNORMAL LOW (ref 3.87–5.11)
RDW: 15.3 % (ref 11.5–15.5)
WBC Count: 6.8 10*3/uL (ref 4.0–10.5)
nRBC: 0 % (ref 0.0–0.2)

## 2020-10-28 LAB — SAMPLE TO BLOOD BANK

## 2020-10-28 LAB — CMP (CANCER CENTER ONLY)
ALT: 44 U/L (ref 0–44)
AST: 51 U/L — ABNORMAL HIGH (ref 15–41)
Albumin: 3 g/dL — ABNORMAL LOW (ref 3.5–5.0)
Alkaline Phosphatase: 246 U/L — ABNORMAL HIGH (ref 38–126)
Anion gap: 6 (ref 5–15)
BUN: 11 mg/dL (ref 8–23)
CO2: 25 mmol/L (ref 22–32)
Calcium: 8.8 mg/dL — ABNORMAL LOW (ref 8.9–10.3)
Chloride: 111 mmol/L (ref 98–111)
Creatinine: 0.63 mg/dL (ref 0.44–1.00)
GFR, Estimated: >60 mL/min (ref >60)
Glucose, Bld: 74 mg/dL (ref 70–99)
Potassium: 3.9 mmol/L (ref 3.5–5.1)
Sodium: 142 mmol/L (ref 135–145)
Total Bilirubin: 0.3 mg/dL (ref 0.3–1.2)
Total Protein: 5.8 g/dL — ABNORMAL LOW (ref 6.5–8.1)

## 2020-10-28 LAB — CEA (IN HOUSE-CHCC): CEA (CHCC-In House): 98.64 ng/mL — ABNORMAL HIGH (ref 0.00–5.00)

## 2020-10-28 LAB — TOTAL PROTEIN, URINE DIPSTICK: Protein, ur: 100 mg/dL — AB

## 2020-10-28 MED ORDER — PALONOSETRON HCL INJECTION 0.25 MG/5ML
INTRAVENOUS | Status: AC
  Filled 2020-10-28: qty 5

## 2020-10-28 MED ORDER — SODIUM CHLORIDE 0.9% FLUSH
10.0000 mL | Freq: Once | INTRAVENOUS | Status: AC
  Administered 2020-10-28: 10 mL
  Filled 2020-10-28: qty 10

## 2020-10-28 MED ORDER — SODIUM CHLORIDE 0.9 % IV SOLN
140.0000 mg/m2 | Freq: Once | INTRAVENOUS | Status: AC
  Administered 2020-10-28: 220 mg via INTRAVENOUS
  Filled 2020-10-28: qty 11

## 2020-10-28 MED ORDER — SODIUM CHLORIDE 0.9 % IV SOLN
1600.0000 mg/m2 | INTRAVENOUS | Status: DC
  Administered 2020-10-28: 2550 mg via INTRAVENOUS
  Filled 2020-10-28: qty 51

## 2020-10-28 MED ORDER — ATROPINE SULFATE 1 MG/ML IJ SOLN
INTRAMUSCULAR | Status: AC
  Filled 2020-10-28: qty 1

## 2020-10-28 MED ORDER — ATROPINE SULFATE 1 MG/ML IJ SOLN
0.5000 mg | Freq: Once | INTRAMUSCULAR | Status: AC
  Administered 2020-10-28: 0.5 mg via INTRAVENOUS

## 2020-10-28 MED ORDER — SODIUM CHLORIDE 0.9 % IV SOLN
Freq: Once | INTRAVENOUS | Status: AC
  Filled 2020-10-28: qty 250

## 2020-10-28 MED ORDER — SODIUM CHLORIDE 0.9 % IV SOLN
200.0000 mg/m2 | Freq: Once | INTRAVENOUS | Status: AC
  Administered 2020-10-28: 318 mg via INTRAVENOUS
  Filled 2020-10-28: qty 15.9

## 2020-10-28 MED ORDER — PALONOSETRON HCL INJECTION 0.25 MG/5ML
0.2500 mg | Freq: Once | INTRAVENOUS | Status: AC
  Administered 2020-10-28: 0.25 mg via INTRAVENOUS

## 2020-10-28 MED ORDER — SODIUM CHLORIDE 0.9 % IV SOLN
10.0000 mg | Freq: Once | INTRAVENOUS | Status: AC
  Administered 2020-10-28: 10 mg via INTRAVENOUS
  Filled 2020-10-28: qty 10

## 2020-10-28 MED ORDER — HEPARIN SOD (PORK) LOCK FLUSH 100 UNIT/ML IV SOLN
500.0000 [IU] | Freq: Once | INTRAVENOUS | Status: DC | PRN
  Filled 2020-10-28: qty 5

## 2020-10-28 MED ORDER — SODIUM CHLORIDE 0.9% FLUSH
10.0000 mL | INTRAVENOUS | Status: DC | PRN
  Filled 2020-10-28: qty 10

## 2020-10-28 MED ORDER — SODIUM CHLORIDE 0.9 % IV SOLN
5.0000 mg/kg | Freq: Once | INTRAVENOUS | Status: AC
  Administered 2020-10-28: 275 mg via INTRAVENOUS
  Filled 2020-10-28: qty 11

## 2020-10-28 NOTE — Progress Notes (Signed)
Urine protein 100 today.  MD aware.

## 2020-10-28 NOTE — Progress Notes (Signed)
Nutrition   Nutrition follow-up completed with patient during infusion for metastatic colon cancer. She reports her appetite has good days and bad days. Yesterday she had a Boost, 2 eggs and grits with cheese and butter for breakfast, ramen noodles, 2 cookies for lunch with ginger ale, and chicken noodle soup, 2 cookies and another ginger ale for dinner. Patient is going to the store after treatment today, says she is going to make spaghetti. She usually drinks 2 Boost daily, reports they fill her up and does not want to eat less at meal times. Weight today 120.2 lbs have decreased ~4 lbs from 124 lb on 2/24. Patient reports traveling to her daughter's home recently to attend doctors appointments with her, she has been stressed and not eating as much while on the road.   NUTRITION DIAGNOSIS: Increased nutrient needs continues  INTERVENTION:  Discussed strategies for increasing calories and protein Educated on nutrient dense snacks when traveling Patient will aim to drink 3 Boost daily Continue to increase variety to provide additional calories and protein    MONITORING, EVALUATION, GOAL: weight trends, intake   NEXT VISIT: Via telephone

## 2020-10-28 NOTE — Patient Instructions (Signed)

## 2020-10-28 NOTE — Progress Notes (Signed)
[facility] OFFICE PROGRESS NOTE   Diagnosis: Colon cancer  INTERVAL HISTORY:   Stacy Burns returns as scheduled.  She complete another cycle of FOLFIRI/Avastin on 10/07/2020.  She has mild nosebleeding.  No other bleeding aside from bruising at the Lovenox injection sites.  She had 2 days of diarrhea following chemotherapy.  The diarrhea improves with Imodium.  She relates weight loss to "stress "related to a family members illness.  Objective:  Vital signs in last 24 hours:  Blood pressure (!) 122/53, pulse 78, temperature 98.2 F (36.8 C), temperature source Tympanic, resp. rate 18, height 5\' 7"  (1.702 m), weight 120 lb 6.4 oz (54.6 kg), SpO2 100 %.    HEENT: No thrush or ulcers Resp: Lungs clear bilaterally Cardio: Regular rate and rhythm GI: No hepatosplenomegaly, nontender Vascular: No leg edema  Skin: Small ecchymoses at the dorsum of the hands, nodular induration at the Lovenox injection sites  Portacath/PICC-without erythema  Lab Results:  Lab Results  Component Value Date   WBC 6.8 10/28/2020   HGB 8.9 (L) 10/28/2020   HCT 28.2 (L) 10/28/2020   MCV 104.1 (H) 10/28/2020   PLT 150 10/28/2020   NEUTROABS 4.3 10/28/2020    CMP  Lab Results  Component Value Date   NA 141 10/07/2020   K 3.6 10/07/2020   CL 111 10/07/2020   CO2 23 10/07/2020   GLUCOSE 79 10/07/2020   BUN 15 10/07/2020   CREATININE 0.65 10/07/2020   CALCIUM 8.7 (L) 10/07/2020   PROT 5.9 (L) 10/07/2020   ALBUMIN 3.0 (L) 10/07/2020   AST 62 (H) 10/07/2020   ALT 59 (H) 10/07/2020   ALKPHOS 249 (H) 10/07/2020   BILITOT 0.4 10/07/2020   GFRNONAA >60 10/07/2020   GFRAA >60 05/06/2020    Lab Results  Component Value Date   CEA1 127.47 (H) 10/07/2020    Medications: I have reviewed the patient's current medications.   Assessment/Plan: 1. 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 Cirrhosis. Innumerable liver lesions.  Colonoscopy 12/18/2019-ulcerated partially obstructing large mass in the mid ascending colon. Biopsy invasive adenocarcinoma; preserved expression major MMR proteins  Upper endoscopy 12/18/2019-normal esophagus, stomach, examined duodenum  CEA 12/19/2019-8,923  Biopsy liver lesion 12/23/2019-adenocarcinoma  Foundation 1-K-ras wild-type; tumor mutation burden greater than or equal to 10; microsatellite stable; BRAF V600E  Cycle 1 FOLFOX 01/01/2020  Cycle 2 FOLFOX 01/15/2020  Cycle 3 FOLFOX 01/29/2020, Udenyca added  Cycle 4 FOLFOX 02/12/2020, Udenyca held  Cycle 5 FOLFOX 02/26/2020, Udenyca  Cycle 6 FOLFOX 03/11/2020, Udenyca held  CT abdomen/pelvis 03/16/2020-stable diffuse liver lesions, stable strictured appearing ascending colon, possible pericolonic implant, vague left lower lobe nodule  Cycle 1 FOLFIRI/Avastin 04/06/2020  Cycle 2 FOLFIRI/Avastin 04/21/2020  Cycle 3 FOLFIRI/Avastin 05/06/2020  Cycle4FOLFIRI/Avastin 05/20/2020  Cycle 5 FOLFIRI/Avastin 06/03/2020  CTs 06/15/2020-mild to moderate improvement in hepatic metastasis. No new or progressive disease. Narrowing within the proximal ascending colon with upstream mild cecal and terminal ileum dilatation, similar.  Cycle 6 FOLFIRI/Avastin 06/17/2020  Cycle 7 FOLFIRI/Avastin 07/01/2020  Cycle 8 FOLFIRI/Avastin 07/15/2020  Cycle 9 FOLFIRI/Avastin 07/29/2020-5-FU bolus eliminated, 5-FU infusion and irinotecan dose reduced  Cycle 10 FOLFIRI/Avastin 08/12/2020  CTs 08/23/2020- mild residual wall thickening involving the cecum/ascending colon.  Numerous liver metastases improved.  Cycle 11 FOLFIRI/Avastin 08/25/2020  Cycle 12 FOLFIRI/Avastin 09/16/2020  Cycle 13 FOLFIRI/Avastin 10/07/2020  Cycle 14 FOLFIRI/Avastin 10/28/2020 2. Anemia secondary to GI blood loss, on oral iron  BID 3. Cirrhosis 4. 12/26/2019-hospital admission for right lower extremity DVT and GI bleed, treated  with heparin anticoagulation beginning 12/26/2019, converted to Lovenox at discharge 12/29/2019 5. History of low-grade fever-likely "tumor"fever 6. COVID-19 infection January 2021     Disposition: Stacy Burns appears stable.  She will complete another cycle of FOLFIRI/Avastin today.  We will follow up on the CEA from today.  The CEA was lower when she was here on 10/07/2020.  She will return for an office visit and chemotherapy in 3 weeks.  [name], MD  10/28/2020  11:00 AM

## 2020-10-28 NOTE — Patient Instructions (Signed)
[facility] Discharge Instructions for Patients Receiving Chemotherapy  Today you received the following chemotherapy agents: Avastin/Irinotecan/Leucovorin/5FU.  To help prevent nausea and vomiting after your treatment, we encourage you to take your nausea medication as directed.   If you develop nausea and vomiting that is not controlled by your nausea medication, call the clinic.   BELOW ARE SYMPTOMS THAT SHOULD BE REPORTED IMMEDIATELY:  *FEVER GREATER THAN 100.5 F  *CHILLS WITH OR WITHOUT FEVER  NAUSEA AND VOMITING THAT IS NOT CONTROLLED WITH YOUR NAUSEA MEDICATION  *UNUSUAL SHORTNESS OF BREATH  *UNUSUAL BRUISING OR BLEEDING  TENDERNESS IN MOUTH AND THROAT WITH OR WITHOUT PRESENCE OF ULCERS  *URINARY PROBLEMS  *BOWEL PROBLEMS  UNUSUAL RASH Items with * indicate a potential emergency and should be followed up as soon as possible.  Feel free to call the clinic should you have any questions or concerns. The clinic phone number is [phone].  Please show the [place] at check-in to the Emergency Department and triage nurse.

## 2020-10-30 ENCOUNTER — Inpatient Hospital Stay: Payer: BC Managed Care – PPO

## 2020-10-30 ENCOUNTER — Other Ambulatory Visit: Payer: Self-pay

## 2020-10-30 VITALS — BP 116/48 | HR 72 | Temp 98.8°F | Resp 18

## 2020-10-30 DIAGNOSIS — Z5112 Encounter for antineoplastic immunotherapy: Secondary | ICD-10-CM | POA: Diagnosis not present

## 2020-10-30 MED ORDER — HEPARIN SOD (PORK) LOCK FLUSH 100 UNIT/ML IV SOLN
500.0000 [IU] | Freq: Once | INTRAVENOUS | Status: AC | PRN
  Administered 2020-10-30: 500 [IU]
  Filled 2020-10-30: qty 5

## 2020-10-30 MED ORDER — PEGFILGRASTIM-CBQV 6 MG/0.6ML ~~LOC~~ SOSY
6.0000 mg | PREFILLED_SYRINGE | Freq: Once | SUBCUTANEOUS | Status: AC
  Administered 2020-10-30: 6 mg via SUBCUTANEOUS

## 2020-10-30 MED ORDER — SODIUM CHLORIDE 0.9% FLUSH
10.0000 mL | INTRAVENOUS | Status: DC | PRN
  Administered 2020-10-30: 10 mL
  Filled 2020-10-30: qty 10

## 2020-11-03 ENCOUNTER — Telehealth: Payer: Self-pay | Admitting: Oncology

## 2020-11-03 NOTE — Telephone Encounter (Signed)
Scheduled appt per 3/17 los - pt is aware of 4/7 appt and pump d/c at [facility] location.

## 2020-11-05 ENCOUNTER — Other Ambulatory Visit: Payer: Self-pay | Admitting: Oncology

## 2020-11-11 ENCOUNTER — Other Ambulatory Visit: Payer: Self-pay | Admitting: Nurse Practitioner

## 2020-11-11 DIAGNOSIS — C182 Malignant neoplasm of ascending colon: Secondary | ICD-10-CM

## 2020-11-13 ENCOUNTER — Other Ambulatory Visit: Payer: Self-pay | Admitting: Oncology

## 2020-11-18 ENCOUNTER — Other Ambulatory Visit: Payer: Self-pay

## 2020-11-18 ENCOUNTER — Inpatient Hospital Stay: Payer: BC Managed Care – PPO

## 2020-11-18 ENCOUNTER — Telehealth: Payer: Self-pay | Admitting: Nurse Practitioner

## 2020-11-18 ENCOUNTER — Encounter: Payer: Self-pay | Admitting: Nurse Practitioner

## 2020-11-18 ENCOUNTER — Inpatient Hospital Stay: Payer: BC Managed Care – PPO | Attending: Oncology

## 2020-11-18 ENCOUNTER — Inpatient Hospital Stay (HOSPITAL_BASED_OUTPATIENT_CLINIC_OR_DEPARTMENT_OTHER): Payer: BC Managed Care – PPO | Admitting: Nurse Practitioner

## 2020-11-18 VITALS — BP 117/40 | HR 64 | Temp 98.1°F | Resp 18 | Ht 67.0 in | Wt 123.5 lb

## 2020-11-18 DIAGNOSIS — K7460 Unspecified cirrhosis of liver: Secondary | ICD-10-CM | POA: Diagnosis not present

## 2020-11-18 DIAGNOSIS — Z5189 Encounter for other specified aftercare: Secondary | ICD-10-CM | POA: Insufficient documentation

## 2020-11-18 DIAGNOSIS — Z452 Encounter for adjustment and management of vascular access device: Secondary | ICD-10-CM | POA: Insufficient documentation

## 2020-11-18 DIAGNOSIS — C787 Secondary malignant neoplasm of liver and intrahepatic bile duct: Secondary | ICD-10-CM | POA: Diagnosis not present

## 2020-11-18 DIAGNOSIS — Z5112 Encounter for antineoplastic immunotherapy: Secondary | ICD-10-CM | POA: Diagnosis present

## 2020-11-18 DIAGNOSIS — Z5111 Encounter for antineoplastic chemotherapy: Secondary | ICD-10-CM | POA: Insufficient documentation

## 2020-11-18 DIAGNOSIS — C182 Malignant neoplasm of ascending colon: Secondary | ICD-10-CM

## 2020-11-18 DIAGNOSIS — D649 Anemia, unspecified: Secondary | ICD-10-CM | POA: Diagnosis not present

## 2020-11-18 DIAGNOSIS — Z8616 Personal history of COVID-19: Secondary | ICD-10-CM | POA: Diagnosis not present

## 2020-11-18 DIAGNOSIS — D500 Iron deficiency anemia secondary to blood loss (chronic): Secondary | ICD-10-CM | POA: Insufficient documentation

## 2020-11-18 LAB — CBC WITH DIFFERENTIAL (CANCER CENTER ONLY)
Abs Immature Granulocytes: 0.05 10*3/uL (ref 0.00–0.07)
Basophils Absolute: 0.1 10*3/uL (ref 0.0–0.1)
Basophils Relative: 1 %
Eosinophils Absolute: 0.1 10*3/uL (ref 0.0–0.5)
Eosinophils Relative: 2 %
HCT: 28.7 % — ABNORMAL LOW (ref 36.0–46.0)
Hemoglobin: 8.9 g/dL — ABNORMAL LOW (ref 12.0–15.0)
Immature Granulocytes: 1 %
Lymphocytes Relative: 18 %
Lymphs Abs: 1.2 10*3/uL (ref 0.7–4.0)
MCH: 32.4 pg (ref 26.0–34.0)
MCHC: 31 g/dL (ref 30.0–36.0)
MCV: 104.4 fL — ABNORMAL HIGH (ref 80.0–100.0)
Monocytes Absolute: 0.7 10*3/uL (ref 0.1–1.0)
Monocytes Relative: 11 %
Neutro Abs: 4.5 10*3/uL (ref 1.7–7.7)
Neutrophils Relative %: 67 %
Platelet Count: 157 10*3/uL (ref 150–400)
RBC: 2.75 MIL/uL — ABNORMAL LOW (ref 3.87–5.11)
RDW: 15.9 % — ABNORMAL HIGH (ref 11.5–15.5)
WBC Count: 6.7 10*3/uL (ref 4.0–10.5)
nRBC: 0 % (ref 0.0–0.2)

## 2020-11-18 LAB — MAGNESIUM: Magnesium: 1.9 mg/dL (ref 1.7–2.4)

## 2020-11-18 LAB — CMP (CANCER CENTER ONLY)
ALT: 28 U/L (ref 0–44)
AST: 32 U/L (ref 15–41)
Albumin: 3.3 g/dL — ABNORMAL LOW (ref 3.5–5.0)
Alkaline Phosphatase: 188 U/L — ABNORMAL HIGH (ref 38–126)
Anion gap: 7 (ref 5–15)
BUN: 19 mg/dL (ref 8–23)
CO2: 25 mmol/L (ref 22–32)
Calcium: 8.8 mg/dL — ABNORMAL LOW (ref 8.9–10.3)
Chloride: 108 mmol/L (ref 98–111)
Creatinine: 0.65 mg/dL (ref 0.44–1.00)
GFR, Estimated: >60 mL/min (ref >60)
Glucose, Bld: 79 mg/dL (ref 70–99)
Potassium: 4.2 mmol/L (ref 3.5–5.1)
Sodium: 140 mmol/L (ref 135–145)
Total Bilirubin: 0.3 mg/dL (ref 0.3–1.2)
Total Protein: 5.9 g/dL — ABNORMAL LOW (ref 6.5–8.1)

## 2020-11-18 LAB — TOTAL PROTEIN, URINE DIPSTICK

## 2020-11-18 MED ORDER — SODIUM CHLORIDE 0.9 % IV SOLN
200.0000 mg/m2 | Freq: Once | INTRAVENOUS | Status: AC
  Administered 2020-11-18: 318 mg via INTRAVENOUS
  Filled 2020-11-18: qty 15.9

## 2020-11-18 MED ORDER — PALONOSETRON HCL INJECTION 0.25 MG/5ML
0.2500 mg | Freq: Once | INTRAVENOUS | Status: AC
  Administered 2020-11-18: 0.25 mg via INTRAVENOUS

## 2020-11-18 MED ORDER — SODIUM CHLORIDE 0.9 % IV SOLN
5.2000 mg/kg | Freq: Once | INTRAVENOUS | Status: AC
  Administered 2020-11-18: 300 mg via INTRAVENOUS
  Filled 2020-11-18: qty 12

## 2020-11-18 MED ORDER — SODIUM CHLORIDE 0.9 % IV SOLN
140.0000 mg/m2 | Freq: Once | INTRAVENOUS | Status: AC
  Administered 2020-11-18: 220 mg via INTRAVENOUS
  Filled 2020-11-18: qty 11

## 2020-11-18 MED ORDER — SODIUM CHLORIDE 0.9 % IV SOLN
Freq: Once | INTRAVENOUS | Status: AC
  Filled 2020-11-18: qty 250

## 2020-11-18 MED ORDER — ATROPINE SULFATE 1 MG/ML IJ SOLN
0.5000 mg | Freq: Once | INTRAMUSCULAR | Status: AC | PRN
  Administered 2020-11-18: 0.5 mg via INTRAVENOUS

## 2020-11-18 MED ORDER — SODIUM CHLORIDE 0.9 % IV SOLN
1600.0000 mg/m2 | INTRAVENOUS | Status: DC
  Administered 2020-11-18: 2550 mg via INTRAVENOUS
  Filled 2020-11-18: qty 50

## 2020-11-18 MED ORDER — SODIUM CHLORIDE 0.9 % IV SOLN
10.0000 mg | Freq: Once | INTRAVENOUS | Status: AC
  Administered 2020-11-18: 10 mg via INTRAVENOUS
  Filled 2020-11-18: qty 10

## 2020-11-18 MED ORDER — OXYCODONE-ACETAMINOPHEN 5-325 MG PO TABS: 1.0000 | ORAL_TABLET | ORAL | 0 refills | Status: DC | PRN

## 2020-11-18 NOTE — Patient Instructions (Signed)
[place] Discharge Instructions for Patients Receiving Chemotherapy  Today you received the following chemotherapy agents Bevacizumab-bvzr (ZIRABEV), Irinotecan (CAMPTOSAR), Leucovorin & Flourouracil (ADRUCIL).  To help prevent nausea and vomiting after your treatment, we encourage you to take your nausea medication as prescribed.   If you develop nausea and vomiting that is not controlled by your nausea medication, call the clinic.   BELOW ARE SYMPTOMS THAT SHOULD BE REPORTED IMMEDIATELY:  *FEVER GREATER THAN 100.5 F  *CHILLS WITH OR WITHOUT FEVER  NAUSEA AND VOMITING THAT IS NOT CONTROLLED WITH YOUR NAUSEA MEDICATION  *UNUSUAL SHORTNESS OF BREATH  *UNUSUAL BRUISING OR BLEEDING  TENDERNESS IN MOUTH AND THROAT WITH OR WITHOUT PRESENCE OF ULCERS  *URINARY PROBLEMS  *BOWEL PROBLEMS  UNUSUAL RASH Items with * indicate a potential emergency and should be followed up as soon as possible.  Feel free to call the clinic should you have any questions or concerns at The clinic phone number is [phone].  Please show the [place] at check-in to the Emergency Department and triage nurse.

## 2020-11-18 NOTE — Telephone Encounter (Signed)
Scheduled appt per 4/7 los - gave patient AVS and calender in infusion

## 2020-11-18 NOTE — Progress Notes (Signed)
[facility] OFFICE PROGRESS NOTE   Diagnosis: Colon cancer  INTERVAL HISTORY:   Ms. Santucci returns as scheduled.  She completed cycle 14 FOLFIRI/Avastin 10/28/2020.  She denies nausea/vomiting.  No mouth sores.  Some diarrhea, controlled with Imodium.  She has blood with nose blowing in the morning hours.  No other bleeding.  No fever or cough.  Stable dyspnea on exertion.  She has intermittent pain at the right lower abdomen.  She thinks this may be related to the Lovenox injections.    Objective:  Vital signs in last 24 hours:  Blood pressure (!) 117/40, pulse 64, temperature 98.1 F (36.7 C), temperature source Tympanic, resp. rate 18, height $RemoveBe'5\' 7"'rXrLcZXrZ$  (1.702 m), weight 123 lb 7.2 oz (56 kg), SpO2 100 %.    HEENT: No thrush or ulcers. Resp: Lungs clear bilaterally. Cardio: Regular rate and rhythm. GI: Abdomen soft and nontender.  Liver edge palpable. Vascular: No leg edema. Skin: Ecchymoses in various stages of resolution lower abdominal wall.  Palpable nodular induration at previous Lovenox injection sites.  Ecchymosis dorsal aspect of right hand. Port-A-Cath without erythema.  Lab Results:  Lab Results  Component Value Date   WBC 6.7 11/18/2020   HGB 8.9 (L) 11/18/2020   HCT 28.7 (L) 11/18/2020   MCV 104.4 (H) 11/18/2020   PLT 157 11/18/2020   NEUTROABS 4.5 11/18/2020    Imaging:  No results found.  Medications: I have reviewed the patient's current medications.  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 Cirrhosis. Innumerable liver lesions.  Colonoscopy 12/18/2019-ulcerated partially obstructing large mass in the mid ascending colon. Biopsy invasive adenocarcinoma; preserved expression major MMR proteins  Upper endoscopy 12/18/2019-normal esophagus, stomach, examined duodenum  CEA 12/19/2019-8,923  Biopsy liver lesion  12/23/2019-adenocarcinoma  Foundation 1-K-ras wild-type; tumor mutation burden greater than or equal to 10; microsatellite stable; BRAF V600E  Cycle 1 FOLFOX 01/01/2020  Cycle 2 FOLFOX 01/15/2020  Cycle 3 FOLFOX 01/29/2020, Udenyca added  Cycle 4 FOLFOX 02/12/2020, Udenyca held  Cycle 5 FOLFOX 02/26/2020, Udenyca  Cycle 6 FOLFOX 03/11/2020, Udenyca held  CT abdomen/pelvis 03/16/2020-stable diffuse liver lesions, stable strictured appearing ascending colon, possible pericolonic implant, vague left lower lobe nodule  Cycle 1 FOLFIRI/Avastin 04/06/2020  Cycle 2 FOLFIRI/Avastin 04/21/2020  Cycle 3 FOLFIRI/Avastin 05/06/2020  Cycle4FOLFIRI/Avastin 05/20/2020  Cycle 5 FOLFIRI/Avastin 06/03/2020  CTs 06/15/2020-mild to moderate improvement in hepatic metastasis. No new or progressive disease. Narrowing within the proximal ascending colon with upstream mild cecal and terminal ileum dilatation, similar.  Cycle 6 FOLFIRI/Avastin 06/17/2020  Cycle 7 FOLFIRI/Avastin 07/01/2020  Cycle 8 FOLFIRI/Avastin 07/15/2020  Cycle 9 FOLFIRI/Avastin 07/29/2020-5-FU bolus eliminated, 5-FU infusion and irinotecan dose reduced  Cycle 10 FOLFIRI/Avastin 08/12/2020  CTs 08/23/2020-mild residual wall thickening involving the cecum/ascending colon. Numerous liver metastases improved.  Cycle 11 FOLFIRI/Avastin 08/25/2020  Cycle 12 FOLFIRI/Avastin 09/16/2020  Cycle 13 FOLFIRI/Avastin 10/07/2020  Cycle 14 FOLFIRI/Avastin 10/28/2020  Cycle 15 FOLFIRI/Avastin 11/18/2020 2. 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 Cirrhosis 4. 12/26/2019-hospital admission for right lower extremity DVT and GI bleed, treated with heparin anticoagulation beginning 12/26/2019, converted to Lovenox at discharge 12/29/2019 5. History of low-grade fever-likely "tumor"fever 6. COVID-19 infection January 2021   Disposition: Ms. Bissette appears stable.  She has completed 14 cycles of FOLFIRI/Avastin.  She continues to tolerate  treatment well.  There is no clinical evidence of disease progression.  Most recent CEA was further  improved.  Plan to proceed with cycle 15 today as scheduled.  She will have restaging CTs prior to her next visit.  We reviewed the CBC from today.  Counts adequate to proceed with treatment.  Refill for Percocet sent to her pharmacy.  She will return for lab, follow-up, FOLFIRI/Avastin in 3 weeks.  Plan for restaging CTs a few days prior.    Ned Card ANP/GNP-BC   11/18/2020  8:43 AM

## 2020-11-20 ENCOUNTER — Inpatient Hospital Stay: Payer: BC Managed Care – PPO

## 2020-11-20 ENCOUNTER — Other Ambulatory Visit: Payer: Self-pay

## 2020-11-20 VITALS — BP 118/51 | HR 63 | Temp 97.3°F | Resp 17

## 2020-11-20 DIAGNOSIS — Z5112 Encounter for antineoplastic immunotherapy: Secondary | ICD-10-CM | POA: Diagnosis not present

## 2020-11-20 DIAGNOSIS — C182 Malignant neoplasm of ascending colon: Secondary | ICD-10-CM

## 2020-11-20 MED ORDER — PEGFILGRASTIM-CBQV 6 MG/0.6ML ~~LOC~~ SOSY
6.0000 mg | PREFILLED_SYRINGE | Freq: Once | SUBCUTANEOUS | Status: AC
  Administered 2020-11-20: 6 mg via SUBCUTANEOUS

## 2020-11-20 MED ORDER — HEPARIN SOD (PORK) LOCK FLUSH 100 UNIT/ML IV SOLN
500.0000 [IU] | Freq: Once | INTRAVENOUS | Status: AC | PRN
  Administered 2020-11-20: 500 [IU]
  Filled 2020-11-20: qty 5

## 2020-11-20 MED ORDER — SODIUM CHLORIDE 0.9% FLUSH
10.0000 mL | INTRAVENOUS | Status: DC | PRN
  Administered 2020-11-20: 10 mL
  Filled 2020-11-20: qty 10

## 2020-11-20 NOTE — Patient Instructions (Signed)
Pegfilgrastim injection What is this medicine? PEGFILGRASTIM (PEG fil gra stim) is a long-acting granulocyte colony-stimulating factor that stimulates the growth of neutrophils, a type of white blood cell important in the body's fight against infection. It is used to reduce the incidence of fever and infection in patients with certain types of cancer who are receiving chemotherapy that affects the bone marrow, and to increase survival after being exposed to high doses of radiation. This medicine may be used for other purposes; ask your health care provider or pharmacist if you have questions. COMMON BRAND NAME(S): Fulphila, Neulasta, Nyvepria, UDENYCA, Ziextenzo What should I tell my health care provider before I take this medicine? They need to know if you have any of these conditions:  kidney disease  latex allergy  ongoing radiation therapy  sickle cell disease  skin reactions to acrylic adhesives (On-Body Injector only)  an unusual or allergic reaction to pegfilgrastim, filgrastim, other medicines, foods, dyes, or preservatives  pregnant or trying to get pregnant  breast-feeding How should I use this medicine? This medicine is for injection under the skin. If you get this medicine at home, you will be taught how to prepare and give the pre-filled syringe or how to use the On-body Injector. Refer to the patient Instructions for Use for detailed instructions. Use exactly as directed. Tell your healthcare provider immediately if you suspect that the On-body Injector may not have performed as intended or if you suspect the use of the On-body Injector resulted in a missed or partial dose. It is important that you put your used needles and syringes in a special sharps container. Do not put them in a trash can. If you do not have a sharps container, call your pharmacist or healthcare provider to get one. Talk to your pediatrician regarding the use of this medicine in children. While this drug  may be prescribed for selected conditions, precautions do apply. Overdosage: If you think you have taken too much of this medicine contact a poison control center or emergency room at once. NOTE: This medicine is only for you. Do not share this medicine with others. What if I miss a dose? It is important not to miss your dose. Call your doctor or health care professional if you miss your dose. If you miss a dose due to an On-body Injector failure or leakage, a new dose should be administered as soon as possible using a single prefilled syringe for manual use. What may interact with this medicine? Interactions have not been studied. This list may not describe all possible interactions. Give your health care provider a list of all the medicines, herbs, non-prescription drugs, or dietary supplements you use. Also tell them if you smoke, drink alcohol, or use illegal drugs. Some items may interact with your medicine. What should I watch for while using this medicine? Your condition will be monitored carefully while you are receiving this medicine. You may need blood work done while you are taking this medicine. Talk to your health care provider about your risk of cancer. You may be more at risk for certain types of cancer if you take this medicine. If you are going to need a MRI, CT scan, or other procedure, tell your doctor that you are using this medicine (On-Body Injector only). What side effects may I notice from receiving this medicine? Side effects that you should report to your doctor or health care professional as soon as possible:  allergic reactions (skin rash, itching or hives, swelling of   the face, lips, or tongue)  back pain  dizziness  fever  pain, redness, or irritation at site where injected  pinpoint red spots on the skin  red or dark-[place] urine  shortness of breath or breathing problems  stomach or side pain, or pain at the shoulder  swelling  tiredness  trouble  passing urine or change in the amount of urine  unusual bruising or bleeding Side effects that usually do not require medical attention (report to your doctor or health care professional if they continue or are bothersome):  bone pain  muscle pain This list may not describe all possible side effects. Call your doctor for medical advice about side effects. You may report side effects to FDA at 1-800-FDA-1088. Where should I keep my medicine? Keep out of the reach of children. If you are using this medicine at home, you will be instructed on how to store it. Throw away any unused medicine after the expiration date on the label. NOTE: This sheet is a summary. It may not cover all possible information. If you have questions about this medicine, talk to your doctor, pharmacist, or health care provider.  2021 Elsevier/Gold Standard (2019-08-22 13:20:51)  

## 2020-11-25 ENCOUNTER — Encounter: Payer: Self-pay | Admitting: *Deleted

## 2020-11-25 NOTE — Progress Notes (Signed)
FMLA forms completed on 11/24/20 and faxed to [employer] [employer] [facility] [employer] [phone] w/confirmation received. Copy to HIM and will provide patient copy on 12/09/20 visit.

## 2020-11-30 ENCOUNTER — Encounter: Payer: Self-pay | Admitting: Dietician

## 2020-11-30 NOTE — Progress Notes (Signed)
Nutrition  Attempted to contact patient via phone for nutrition follow-up. Patient did not answer, voicemail left with request for return call. Contact information provided.   Lajuan Lines, RD, [place] [phone]

## 2020-12-04 ENCOUNTER — Other Ambulatory Visit: Payer: Self-pay | Admitting: Oncology

## 2020-12-06 ENCOUNTER — Other Ambulatory Visit (HOSPITAL_BASED_OUTPATIENT_CLINIC_OR_DEPARTMENT_OTHER): Payer: BC Managed Care – PPO

## 2020-12-06 ENCOUNTER — Other Ambulatory Visit: Payer: Self-pay

## 2020-12-06 ENCOUNTER — Inpatient Hospital Stay: Payer: BC Managed Care – PPO

## 2020-12-06 ENCOUNTER — Ambulatory Visit (HOSPITAL_BASED_OUTPATIENT_CLINIC_OR_DEPARTMENT_OTHER)
Admission: RE | Admit: 2020-12-06 | Discharge: 2020-12-06 | Disposition: A | Discharge status: Home / Self Care | Payer: BC Managed Care – PPO | Source: Ambulatory Visit | Attending: Nurse Practitioner | Admitting: Nurse Practitioner

## 2020-12-06 DIAGNOSIS — C182 Malignant neoplasm of ascending colon: Secondary | ICD-10-CM | POA: Insufficient documentation

## 2020-12-06 DIAGNOSIS — Z95828 Presence of other vascular implants and grafts: Secondary | ICD-10-CM

## 2020-12-06 DIAGNOSIS — Z5112 Encounter for antineoplastic immunotherapy: Secondary | ICD-10-CM | POA: Diagnosis not present

## 2020-12-06 MED ORDER — SODIUM CHLORIDE 0.9% FLUSH
10.0000 mL | Freq: Once | INTRAVENOUS | Status: AC
Start: 2020-12-06 — End: 2020-12-06
  Administered 2020-12-06: 10 mL
  Filled 2020-12-06: qty 10

## 2020-12-06 MED ORDER — HEPARIN SOD (PORK) LOCK FLUSH 100 UNIT/ML IV SOLN
500.0000 [IU] | Freq: Once | INTRAVENOUS | Status: AC
  Administered 2020-12-06: 500 [IU]
  Filled 2020-12-06: qty 5

## 2020-12-06 MED ORDER — IOHEXOL 300 MG/ML  SOLN
75.0000 mL | Freq: Once | INTRAMUSCULAR | Status: AC | PRN
  Administered 2020-12-06: 75 mL via INTRAVENOUS

## 2020-12-09 ENCOUNTER — Other Ambulatory Visit: Payer: Self-pay

## 2020-12-09 ENCOUNTER — Inpatient Hospital Stay (HOSPITAL_BASED_OUTPATIENT_CLINIC_OR_DEPARTMENT_OTHER): Payer: BC Managed Care – PPO | Admitting: Oncology

## 2020-12-09 ENCOUNTER — Inpatient Hospital Stay: Payer: BC Managed Care – PPO

## 2020-12-09 ENCOUNTER — Other Ambulatory Visit: Payer: Self-pay | Admitting: *Deleted

## 2020-12-09 VITALS — BP 123/63 | HR 59 | Temp 96.6°F | Resp 18

## 2020-12-09 VITALS — BP 124/54 | HR 60 | Temp 98.1°F | Resp 18 | Ht 67.0 in | Wt 122.0 lb

## 2020-12-09 DIAGNOSIS — C182 Malignant neoplasm of ascending colon: Secondary | ICD-10-CM

## 2020-12-09 DIAGNOSIS — Z5112 Encounter for antineoplastic immunotherapy: Secondary | ICD-10-CM | POA: Diagnosis not present

## 2020-12-09 DIAGNOSIS — Z95828 Presence of other vascular implants and grafts: Secondary | ICD-10-CM

## 2020-12-09 LAB — CBC WITH DIFFERENTIAL (CANCER CENTER ONLY)
Abs Immature Granulocytes: 0.03 10*3/uL (ref 0.00–0.07)
Basophils Absolute: 0.1 10*3/uL (ref 0.0–0.1)
Basophils Relative: 1 %
Eosinophils Absolute: 0.1 10*3/uL (ref 0.0–0.5)
Eosinophils Relative: 2 %
HCT: 28.2 % — ABNORMAL LOW (ref 36.0–46.0)
Hemoglobin: 8.7 g/dL — ABNORMAL LOW (ref 12.0–15.0)
Immature Granulocytes: 1 %
Lymphocytes Relative: 19 %
Lymphs Abs: 1.1 10*3/uL (ref 0.7–4.0)
MCH: 31.9 pg (ref 26.0–34.0)
MCHC: 30.9 g/dL (ref 30.0–36.0)
MCV: 103.3 fL — ABNORMAL HIGH (ref 80.0–100.0)
Monocytes Absolute: 0.7 10*3/uL (ref 0.1–1.0)
Monocytes Relative: 11 %
Neutro Abs: 4 10*3/uL (ref 1.7–7.7)
Neutrophils Relative %: 66 %
Platelet Count: 148 10*3/uL — ABNORMAL LOW (ref 150–400)
RBC: 2.73 MIL/uL — ABNORMAL LOW (ref 3.87–5.11)
RDW: 16.1 % — ABNORMAL HIGH (ref 11.5–15.5)
WBC Count: 6 10*3/uL (ref 4.0–10.5)
nRBC: 0 % (ref 0.0–0.2)

## 2020-12-09 LAB — CMP (CANCER CENTER ONLY)
ALT: 22 U/L (ref 0–44)
AST: 31 U/L (ref 15–41)
Albumin: 3.4 g/dL — ABNORMAL LOW (ref 3.5–5.0)
Alkaline Phosphatase: 177 U/L — ABNORMAL HIGH (ref 38–126)
Anion gap: 7 (ref 5–15)
BUN: 20 mg/dL (ref 8–23)
CO2: 24 mmol/L (ref 22–32)
Calcium: 8.7 mg/dL — ABNORMAL LOW (ref 8.9–10.3)
Chloride: 109 mmol/L (ref 98–111)
Creatinine: 0.53 mg/dL (ref 0.44–1.00)
GFR, Estimated: >60 mL/min (ref >60)
Glucose, Bld: 72 mg/dL (ref 70–99)
Potassium: 4.5 mmol/L (ref 3.5–5.1)
Sodium: 140 mmol/L (ref 135–145)
Total Bilirubin: 0.2 mg/dL — ABNORMAL LOW (ref 0.3–1.2)
Total Protein: 5.7 g/dL — ABNORMAL LOW (ref 6.5–8.1)

## 2020-12-09 LAB — CEA (IN HOUSE-CHCC): CEA (CHCC-In House): 58.78 ng/mL — ABNORMAL HIGH (ref 0.00–5.00)

## 2020-12-09 LAB — TOTAL PROTEIN, URINE DIPSTICK: Protein, ur: NEGATIVE mg/dL

## 2020-12-09 LAB — CEA (ACCESS): CEA (CHCC): 55.69 ng/mL — ABNORMAL HIGH (ref 0.00–5.00)

## 2020-12-09 LAB — MAGNESIUM: Magnesium: 1.9 mg/dL (ref 1.7–2.4)

## 2020-12-09 MED ORDER — POTASSIUM CHLORIDE ER 10 MEQ PO TBCR: 10.0000 meq | EXTENDED_RELEASE_TABLET | Freq: Two times a day (BID) | ORAL | 2 refills | Status: DC

## 2020-12-09 MED ORDER — SODIUM CHLORIDE 0.9 % IV SOLN
1600.0000 mg/m2 | INTRAVENOUS | Status: DC
  Administered 2020-12-09: 2550 mg via INTRAVENOUS
  Filled 2020-12-09: qty 51

## 2020-12-09 MED ORDER — SODIUM CHLORIDE 0.9 % IV SOLN
140.0000 mg/m2 | Freq: Once | INTRAVENOUS | Status: AC
  Administered 2020-12-09: 220 mg via INTRAVENOUS
  Filled 2020-12-09: qty 11

## 2020-12-09 MED ORDER — HEPARIN SOD (PORK) LOCK FLUSH 100 UNIT/ML IV SOLN
500.0000 [IU] | Freq: Once | INTRAVENOUS | Status: DC | PRN
  Filled 2020-12-09: qty 5

## 2020-12-09 MED ORDER — SODIUM CHLORIDE 0.9 % IV SOLN
10.0000 mg | Freq: Once | INTRAVENOUS | Status: AC
  Administered 2020-12-09: 10 mg via INTRAVENOUS
  Filled 2020-12-09: qty 1

## 2020-12-09 MED ORDER — SODIUM CHLORIDE 0.9 % IV SOLN
5.0000 mg/kg | Freq: Once | INTRAVENOUS | Status: AC
  Administered 2020-12-09: 300 mg via INTRAVENOUS
  Filled 2020-12-09: qty 12

## 2020-12-09 MED ORDER — PALONOSETRON HCL INJECTION 0.25 MG/5ML
0.2500 mg | Freq: Once | INTRAVENOUS | Status: AC
Start: 2020-12-09 — End: 2020-12-09
  Administered 2020-12-09: 0.25 mg via INTRAVENOUS
  Filled 2020-12-09: qty 5

## 2020-12-09 MED ORDER — SODIUM CHLORIDE 0.9% FLUSH
10.0000 mL | Freq: Once | INTRAVENOUS | Status: AC
  Administered 2020-12-09: 10 mL
  Filled 2020-12-09: qty 10

## 2020-12-09 MED ORDER — SODIUM CHLORIDE 0.9 % IV SOLN
Freq: Once | INTRAVENOUS | Status: AC
  Filled 2020-12-09: qty 250

## 2020-12-09 MED ORDER — ENOXAPARIN SODIUM 120 MG/0.8ML IJ SOSY: 120.0000 mg | PREFILLED_SYRINGE | INTRAMUSCULAR | 3 refills | Status: DC

## 2020-12-09 MED ORDER — SODIUM CHLORIDE 0.9% FLUSH
10.0000 mL | INTRAVENOUS | Status: DC | PRN
Start: 2020-12-09 — End: 2020-12-09
  Filled 2020-12-09: qty 10

## 2020-12-09 MED ORDER — SODIUM CHLORIDE 0.9 % IV SOLN
200.0000 mg/m2 | Freq: Once | INTRAVENOUS | Status: AC
  Administered 2020-12-09: 318 mg via INTRAVENOUS
  Filled 2020-12-09: qty 15.9

## 2020-12-09 MED ORDER — ATROPINE SULFATE 1 MG/ML IJ SOLN
0.5000 mg | Freq: Once | INTRAMUSCULAR | Status: AC | PRN
  Administered 2020-12-09: 0.5 mg via INTRAVENOUS
  Filled 2020-12-09: qty 1

## 2020-12-09 MED ORDER — SODIUM CHLORIDE 0.9 % IV SOLN: 5.0000 mg/kg | Freq: Once | INTRAVENOUS | Status: DC

## 2020-12-09 NOTE — Progress Notes (Signed)
[facility] OFFICE PROGRESS NOTE   Diagnosis: Colon cancer  INTERVAL HISTORY:   Stacy Burns returns as scheduled.  She completed another cycle of FOLFIRI/Avastin on 11/18/2020.  She has mild nausea and diarrhea following chemotherapy.  Imodium helps the diarrhea.  She continues Lovenox therapy.  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  Objective:  Vital signs in last 24 hours:  Blood pressure (!) 124/54, pulse 60, temperature 98.1 F (36.7 C), temperature source Oral, resp. rate 18, height 5' 7" (1.702 m), weight 122 lb (55.3 kg), SpO2 100 %.    HEENT: No thrush or ulcers Resp: Lungs clear bilaterally Cardio: Regular rate and rhythm GI: No hepatomegaly, nontender Vascular: No leg edema    Portacath/PICC-without erythema  Lab Results:  Lab Results  Component Value Date   WBC 6.0 12/09/2020   HGB 8.7 (L) 12/09/2020   HCT 28.2 (L) 12/09/2020   MCV 103.3 (H) 12/09/2020   PLT 148 (L) 12/09/2020   NEUTROABS 4.0 12/09/2020    CMP  Lab Results  Component Value Date   NA 140 11/18/2020   K 4.2 11/18/2020   CL 108 11/18/2020   CO2 25 11/18/2020   GLUCOSE 79 11/18/2020   BUN 19 11/18/2020   CREATININE 0.65 11/18/2020   CALCIUM 8.8 (L) 11/18/2020   PROT 5.9 (L) 11/18/2020   ALBUMIN 3.3 (L) 11/18/2020   AST 32 11/18/2020   ALT 28 11/18/2020   ALKPHOS 188 (H) 11/18/2020   BILITOT 0.3 11/18/2020   GFRNONAA >60 11/18/2020   GFRAA >60 05/06/2020    Lab Results  Component Value Date   CEA1 98.64 (H) 10/28/2020    Lab Results  Component Value Date   INR 1.1 12/26/2019    Imaging:  CT Abdomen Pelvis W Contrast  Result Date: 12/06/2020 CLINICAL DATA:  Follow-up colon cancer, liver metastases, on chemotherapy EXAM: CT ABDOMEN AND PELVIS WITH CONTRAST TECHNIQUE: Multidetector CT imaging of the abdomen and pelvis was performed using the standard protocol following bolus administration of intravenous contrast. CONTRAST:  75mL OMNIPAQUE  IOHEXOL 300 MG/ML  SOLN COMPARISON:  08/23/2020 FINDINGS: Lower chest: 8 mm calcified nodule in the left lower lobe (series 4/image 1), previously 5 mm in May 2021. 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 Pseudocirrhotic appearance. Index lesions include: --1.8 cm lesion in segment 4A (series 2/image 10), previously 2.0 cm --2.1 cm lesion inferiorly in segment 5 (series 2/image 28), previously 2.2 cm --1.1 cm lesion in segment 3 (series 2/image 28), previously 1.4 cm Gallbladder is unremarkable. No intrahepatic or extrahepatic ductal dilatation. Pancreas: Within normal limits. Spleen: Splenomegaly, measuring 14.2 cm in maximal axial dimension (series 2/image 18). Adrenals/Urinary Tract: Adrenal glands are within normal limits. Kidneys are within normal limits.  No hydronephrosis. Bladder is within normal limits. Stomach/Bowel: Stomach is within normal limits. Wall thickening with pericolonic inflammatory changes involving the ascending colon (coronal image 41), less focal and more generalized when compared to the prior CT. This appearance favors infectious/inflammatory colitis or post treatment changes, although residual underlying colonic neoplasm is possible (series 2/image 43). Distally, the proximal transverse colon is decompressed (series 2/image 37). Proximally, the terminal ileum (series 2/image 58) and cecum (series 2/image 49) are distended/air-filled. The base of the appendix is are also distended and fluid-filled (series 2/image 87). This suggests some degree of stricture with ileus or less likely functional obstruction. However, distally, the distal transverse colon, descending colon, and rectosigmoid colon are not decompressed and contain gas, which argues against colonic obstruction.   Vascular/Lymphatic: No evidence of abdominal aortic aneurysm. Atherosclerotic calcifications of the abdominal aorta and branch vessels. No suspicious abdominopelvic lymphadenopathy. Reproductive:  Heterogeneous uterus with suspected uterine fibroids. Bilateral ovaries are within normal limits. Other: Small volume pelvic ascites. No frank peritoneal disease/omental caking. No free air to suggest macroscopic perforation. Musculoskeletal: Subcutaneous nodularity along the anterior abdominal wall, suggesting injection sites. Mild degenerative changes at L2-3. IMPRESSION: Multifocal hepatic metastases, favored to be mildly improved. Wall thickening involving the ascending colon, less focal with superimposed pericolonic inflammatory changes, favoring post treatment changes and/or infectious/inflammatory colitis. Underlying residual colonic neoplasm remains possible. Secondary adynamic ileus is favored, although some degree of stricture with functional colonic obstruction is difficult to exclude. Electronically Signed   By: Julian Hy M.D.   On: 12/06/2020 15:54    Medications: I have reviewed the patient's current medications.   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 Cirrhosis. Innumerable liver lesions.  Colonoscopy 12/18/2019-ulcerated partially obstructing large mass in the mid ascending colon. Biopsy invasive adenocarcinoma; preserved expression major MMR proteins  Upper endoscopy 12/18/2019-normal esophagus, stomach, examined duodenum  CEA 12/19/2019-8,923  Biopsy liver lesion 12/23/2019-adenocarcinoma  Foundation 1-K-ras wild-type; tumor mutation burden greater than or equal to 10; microsatellite stable; BRAF V600E  Cycle 1 FOLFOX 01/01/2020  Cycle 2 FOLFOX 01/15/2020  Cycle 3 FOLFOX 01/29/2020, Udenyca added  Cycle 4 FOLFOX 02/12/2020, Udenyca held  Cycle 5 FOLFOX 02/26/2020, Udenyca  Cycle 6 FOLFOX 03/11/2020, Udenyca held  CT abdomen/pelvis 03/16/2020-stable diffuse liver lesions, stable strictured appearing ascending colon, possible pericolonic implant,  vague left lower lobe nodule  Cycle 1 FOLFIRI/Avastin 04/06/2020  Cycle 2 FOLFIRI/Avastin 04/21/2020  Cycle 3 FOLFIRI/Avastin 05/06/2020  Cycle4FOLFIRI/Avastin 05/20/2020  Cycle 5 FOLFIRI/Avastin 06/03/2020  CTs 06/15/2020-mild to moderate improvement in hepatic metastasis. No new or progressive disease. Narrowing within the proximal ascending colon with upstream mild cecal and terminal ileum dilatation, similar.  Cycle 6 FOLFIRI/Avastin 06/17/2020  Cycle 7 FOLFIRI/Avastin 07/01/2020  Cycle 8 FOLFIRI/Avastin 07/15/2020  Cycle 9 FOLFIRI/Avastin 07/29/2020-5-FU bolus eliminated, 5-FU infusion and irinotecan dose reduced  Cycle 10 FOLFIRI/Avastin 08/12/2020  CTs 08/23/2020-mild residual wall thickening involving the cecum/ascending colon. Numerous liver metastases improved.  Cycle 11 FOLFIRI/Avastin 08/25/2020  Cycle 12 FOLFIRI/Avastin 09/16/2020  Cycle 13 FOLFIRI/Avastin 10/07/2020  Cycle 14 FOLFIRI/Avastin 10/28/2020  Cycle 15 FOLFIRI/Avastin 11/18/2020  CT abdomen/pelvis 12/06/2020-mild improvement in multifocal hepatic metastases, wall thickening at the ascending colon with pericolonic inflammatory changes  Cycle 16 FOLFIRI/Avastin 12/09/2020 2. 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 Cirrhosis 4. 12/26/2019-hospital admission for right lower extremity DVT and GI bleed, treated with heparin anticoagulation beginning 12/26/2019, converted to Lovenox at discharge 12/29/2019 5. History of low-grade fever-likely "tumor"fever 6. COVID-19 infection January 2021     Disposition: Ms. Snavely appears unchanged.  She is tolerating the FOLFIRI/Avastin well.  The CEA is lower.  The restaging CT reveals continued improvement in the liver metastases.  We discussed a treatment break versus continuing FOLFIRI/Avastin.  She agrees to continue FOLFIRI/Avastin.  Ms. Ullmer will complete another cycle of chemotherapy today.  I reviewed the CT images with Ms. Owens Shark.  She will return  for an office visit and chemotherapy in 3 weeks.  [name], MD  12/09/2020  8:26 AM

## 2020-12-09 NOTE — Patient Instructions (Signed)
[facility]    Discharge Instructions: Thank you for choosing [facility] to provide your oncology and hematology care.   If you have a lab appointment with the [facility], please go directly to the [facility] and check in at the registration area.   Wear comfortable clothing and clothing appropriate for easy access to any Portacath or PICC line.   We strive to give you quality time with your provider. You may need to reschedule your appointment if you arrive late (15 or more minutes).  Arriving late affects you and other patients whose appointments are after yours.  Also, if you miss three or more appointments without notifying the office, you may be dismissed from the clinic at the provider's discretion.      For prescription refill requests, have your pharmacy contact our office and allow 72 hours for refills to be completed.    Today you received the following chemotherapy and/or immunotherapy agents avastin and FOLFIRI.    To help prevent nausea and vomiting after your treatment, we encourage you to take your nausea medication as directed.  BELOW ARE SYMPTOMS THAT SHOULD BE REPORTED IMMEDIATELY: . *FEVER GREATER THAN 100.4 F (38 C) OR HIGHER . *CHILLS OR SWEATING . *NAUSEA AND VOMITING THAT IS NOT CONTROLLED WITH YOUR NAUSEA MEDICATION . *UNUSUAL SHORTNESS OF BREATH . *UNUSUAL BRUISING OR BLEEDING . *URINARY PROBLEMS (pain or burning when urinating, or frequent urination) . *BOWEL PROBLEMS (unusual diarrhea, constipation, pain near the anus) . TENDERNESS IN MOUTH AND THROAT WITH OR WITHOUT PRESENCE OF ULCERS (sore throat, sores in mouth, or a toothache) . UNUSUAL RASH, SWELLING OR PAIN  . UNUSUAL VAGINAL DISCHARGE OR ITCHING   Items with * indicate a potential emergency and should be followed up as soon as possible or go to the Emergency Department if any problems should occur.  Please show the CHEMOTHERAPY ALERT CARD or  IMMUNOTHERAPY ALERT CARD at check-in to the Emergency Department and triage nurse.  Should you have questions after your visit or need to cancel or reschedule your appointment, please contact [facility]  Dept: [phone]  and follow the prompts.  Office hours are 8:00 a.m. to 4:30 p.m. Monday - Friday. Please note that voicemails left after 4:00 p.m. may not be returned until the following business day.  We are closed weekends and major holidays. You have access to a nurse at all times for urgent questions. Please call the main number to the clinic Dept: [phone] and follow the prompts.   For any non-urgent questions, you may also contact your provider using MyChart. We now offer e-Visits for anyone 30 and older to request care online for non-urgent symptoms. For details visit [email or web address].   Also download the MyChart app! Go to the app store, search "MyChart", open the app, select [facility], and log in with your MyChart username and password.  Due to Covid, a mask is required upon entering the hospital/clinic. If you do not have a mask, one will be given to you upon arrival. For doctor visits, patients may have 1 support person aged 7 or older with them. For treatment visits, patients cannot have anyone with them due to current Covid guidelines and our immunocompromised population.  [facility] Discharge Instructions for Patients receiving Home Portable Chemo Pump    **The bag should finish at 46 hours, 96 hours or 7 days. For example, if your pump is scheduled for  46 hours and it was put on at 4pm, it should finish at 2 pm the day it is scheduled to come off regardless of your appointment time.    Estimated time to finish   1100 am on 12/11/2020   ** if the display on your pump reads "Low Volume" and it is beeping, take the batteries out of the pump and come to the cancer center for it to be taken off.   **If the pump  alarms go off prior to the pump reading "Low Volume" then call the [phone] and someone can assist you.  **If the plunger comes out and the bag fluid is running out, please use your chemo spill kit to clean up the spill. Do not use paper towels or other house hold products.  ** If you have problems or questions regarding your pump, please call either the [phone] or the cancer center Monday-Friday 8:00am-4:30pm at [phone] and we will assist you.

## 2020-12-09 NOTE — Progress Notes (Signed)
Pt here for D1C16 of avastin and folori every 2 weeks.  Last treatment 11/18/2020 and pump removed 11/20/2020. Vital signs WNL for treatment. Labs WNL for treatment today.   Tolerated treatment well today without incidence.  Vital signs stable prior to discharge.  Stable during and after treatment. AVS reviewed. Discharged in stable condition ambulatory with ambulatory 5FU pump connected and running.

## 2020-12-11 ENCOUNTER — Other Ambulatory Visit: Payer: Self-pay

## 2020-12-11 ENCOUNTER — Inpatient Hospital Stay: Payer: BC Managed Care – PPO

## 2020-12-11 VITALS — BP 112/58 | HR 65 | Temp 97.9°F | Resp 16

## 2020-12-11 DIAGNOSIS — Z5112 Encounter for antineoplastic immunotherapy: Secondary | ICD-10-CM | POA: Diagnosis not present

## 2020-12-11 MED ORDER — SODIUM CHLORIDE 0.9% FLUSH
10.0000 mL | Freq: Once | INTRAVENOUS | Status: AC
  Administered 2020-12-11: 10 mL
  Filled 2020-12-11: qty 10

## 2020-12-11 MED ORDER — HEPARIN SOD (PORK) LOCK FLUSH 100 UNIT/ML IV SOLN
250.0000 [IU] | Freq: Once | INTRAVENOUS | Status: AC
  Administered 2020-12-11: 250 [IU]
  Filled 2020-12-11: qty 5

## 2020-12-11 MED ORDER — PEGFILGRASTIM-CBQV 6 MG/0.6ML ~~LOC~~ SOSY
6.0000 mg | PREFILLED_SYRINGE | Freq: Once | SUBCUTANEOUS | Status: AC
  Administered 2020-12-11: 6 mg via SUBCUTANEOUS

## 2020-12-11 NOTE — Patient Instructions (Signed)
Pegfilgrastim injection What is this medicine? PEGFILGRASTIM (PEG fil gra stim) is a long-acting granulocyte colony-stimulating factor that stimulates the growth of neutrophils, a type of white blood cell important in the body's fight against infection. It is used to reduce the incidence of fever and infection in patients with certain types of cancer who are receiving chemotherapy that affects the bone marrow, and to increase survival after being exposed to high doses of radiation. This medicine may be used for other purposes; ask your health care provider or pharmacist if you have questions. COMMON BRAND NAME(S): Fulphila, Neulasta, Nyvepria, UDENYCA, Ziextenzo What should I tell my health care provider before I take this medicine? They need to know if you have any of these conditions:  kidney disease  latex allergy  ongoing radiation therapy  sickle cell disease  skin reactions to acrylic adhesives (On-Body Injector only)  an unusual or allergic reaction to pegfilgrastim, filgrastim, other medicines, foods, dyes, or preservatives  pregnant or trying to get pregnant  breast-feeding How should I use this medicine? This medicine is for injection under the skin. If you get this medicine at home, you will be taught how to prepare and give the pre-filled syringe or how to use the On-body Injector. Refer to the patient Instructions for Use for detailed instructions. Use exactly as directed. Tell your healthcare provider immediately if you suspect that the On-body Injector may not have performed as intended or if you suspect the use of the On-body Injector resulted in a missed or partial dose. It is important that you put your used needles and syringes in a special sharps container. Do not put them in a trash can. If you do not have a sharps container, call your pharmacist or healthcare provider to get one. Talk to your pediatrician regarding the use of this medicine in children. While this drug  may be prescribed for selected conditions, precautions do apply. Overdosage: If you think you have taken too much of this medicine contact a poison control center or emergency room at once. NOTE: This medicine is only for you. Do not share this medicine with others. What if I miss a dose? It is important not to miss your dose. Call your doctor or health care professional if you miss your dose. If you miss a dose due to an On-body Injector failure or leakage, a new dose should be administered as soon as possible using a single prefilled syringe for manual use. What may interact with this medicine? Interactions have not been studied. This list may not describe all possible interactions. Give your health care provider a list of all the medicines, herbs, non-prescription drugs, or dietary supplements you use. Also tell them if you smoke, drink alcohol, or use illegal drugs. Some items may interact with your medicine. What should I watch for while using this medicine? Your condition will be monitored carefully while you are receiving this medicine. You may need blood work done while you are taking this medicine. Talk to your health care provider about your risk of cancer. You may be more at risk for certain types of cancer if you take this medicine. If you are going to need a MRI, CT scan, or other procedure, tell your doctor that you are using this medicine (On-Body Injector only). What side effects may I notice from receiving this medicine? Side effects that you should report to your doctor or health care professional as soon as possible:  allergic reactions (skin rash, itching or hives, swelling of   the face, lips, or tongue)  back pain  dizziness  fever  pain, redness, or irritation at site where injected  pinpoint red spots on the skin  red or dark-[place] urine  shortness of breath or breathing problems  stomach or side pain, or pain at the shoulder  swelling  tiredness  trouble  passing urine or change in the amount of urine  unusual bruising or bleeding Side effects that usually do not require medical attention (report to your doctor or health care professional if they continue or are bothersome):  bone pain  muscle pain This list may not describe all possible side effects. Call your doctor for medical advice about side effects. You may report side effects to FDA at 1-800-FDA-1088. Where should I keep my medicine? Keep out of the reach of children. If you are using this medicine at home, you will be instructed on how to store it. Throw away any unused medicine after the expiration date on the label. NOTE: This sheet is a summary. It may not cover all possible information. If you have questions about this medicine, talk to your doctor, pharmacist, or health care provider.  2021 Elsevier/Gold Standard (2019-08-22 13:20:51)  

## 2020-12-26 ENCOUNTER — Other Ambulatory Visit: Payer: Self-pay | Admitting: Oncology

## 2020-12-30 ENCOUNTER — Inpatient Hospital Stay (HOSPITAL_BASED_OUTPATIENT_CLINIC_OR_DEPARTMENT_OTHER): Payer: BC Managed Care – PPO | Admitting: Oncology

## 2020-12-30 ENCOUNTER — Inpatient Hospital Stay: Payer: BC Managed Care – PPO

## 2020-12-30 ENCOUNTER — Other Ambulatory Visit: Payer: Self-pay

## 2020-12-30 ENCOUNTER — Inpatient Hospital Stay: Payer: BC Managed Care – PPO | Attending: Oncology

## 2020-12-30 VITALS — BP 113/55 | HR 62 | Temp 97.9°F | Resp 18 | Ht 67.0 in | Wt 122.6 lb

## 2020-12-30 DIAGNOSIS — D500 Iron deficiency anemia secondary to blood loss (chronic): Secondary | ICD-10-CM | POA: Diagnosis not present

## 2020-12-30 DIAGNOSIS — C182 Malignant neoplasm of ascending colon: Secondary | ICD-10-CM

## 2020-12-30 DIAGNOSIS — C787 Secondary malignant neoplasm of liver and intrahepatic bile duct: Secondary | ICD-10-CM | POA: Diagnosis not present

## 2020-12-30 DIAGNOSIS — Z86718 Personal history of other venous thrombosis and embolism: Secondary | ICD-10-CM | POA: Diagnosis not present

## 2020-12-30 DIAGNOSIS — Z5111 Encounter for antineoplastic chemotherapy: Secondary | ICD-10-CM | POA: Insufficient documentation

## 2020-12-30 DIAGNOSIS — D649 Anemia, unspecified: Secondary | ICD-10-CM | POA: Diagnosis not present

## 2020-12-30 DIAGNOSIS — Z452 Encounter for adjustment and management of vascular access device: Secondary | ICD-10-CM | POA: Diagnosis not present

## 2020-12-30 DIAGNOSIS — Z5189 Encounter for other specified aftercare: Secondary | ICD-10-CM | POA: Insufficient documentation

## 2020-12-30 DIAGNOSIS — Z5112 Encounter for antineoplastic immunotherapy: Secondary | ICD-10-CM | POA: Insufficient documentation

## 2020-12-30 DIAGNOSIS — K7460 Unspecified cirrhosis of liver: Secondary | ICD-10-CM | POA: Diagnosis not present

## 2020-12-30 LAB — CBC WITH DIFFERENTIAL (CANCER CENTER ONLY)
Abs Immature Granulocytes: 0.06 10*3/uL (ref 0.00–0.07)
Basophils Absolute: 0.1 10*3/uL (ref 0.0–0.1)
Basophils Relative: 1 %
Eosinophils Absolute: 0.2 10*3/uL (ref 0.0–0.5)
Eosinophils Relative: 3 %
HCT: 29.9 % — ABNORMAL LOW (ref 36.0–46.0)
Hemoglobin: 9 g/dL — ABNORMAL LOW (ref 12.0–15.0)
Immature Granulocytes: 1 %
Lymphocytes Relative: 20 %
Lymphs Abs: 1.2 10*3/uL (ref 0.7–4.0)
MCH: 31.4 pg (ref 26.0–34.0)
MCHC: 30.1 g/dL (ref 30.0–36.0)
MCV: 104.2 fL — ABNORMAL HIGH (ref 80.0–100.0)
Monocytes Absolute: 0.8 10*3/uL (ref 0.1–1.0)
Monocytes Relative: 12 %
Neutro Abs: 4 10*3/uL (ref 1.7–7.7)
Neutrophils Relative %: 63 %
Platelet Count: 134 10*3/uL — ABNORMAL LOW (ref 150–400)
RBC: 2.87 MIL/uL — ABNORMAL LOW (ref 3.87–5.11)
RDW: 16.8 % — ABNORMAL HIGH (ref 11.5–15.5)
WBC Count: 6.3 10*3/uL (ref 4.0–10.5)
nRBC: 0 % (ref 0.0–0.2)

## 2020-12-30 LAB — CMP (CANCER CENTER ONLY)
ALT: 40 U/L (ref 0–44)
AST: 41 U/L (ref 15–41)
Albumin: 3.3 g/dL — ABNORMAL LOW (ref 3.5–5.0)
Alkaline Phosphatase: 191 U/L — ABNORMAL HIGH (ref 38–126)
Anion gap: 5 (ref 5–15)
BUN: 22 mg/dL (ref 8–23)
CO2: 24 mmol/L (ref 22–32)
Calcium: 8.3 mg/dL — ABNORMAL LOW (ref 8.9–10.3)
Chloride: 111 mmol/L (ref 98–111)
Creatinine: 0.67 mg/dL (ref 0.44–1.00)
GFR, Estimated: >60 mL/min (ref >60)
Glucose, Bld: 78 mg/dL (ref 70–99)
Potassium: 4.4 mmol/L (ref 3.5–5.1)
Sodium: 140 mmol/L (ref 135–145)
Total Bilirubin: 0.3 mg/dL (ref 0.3–1.2)
Total Protein: 6 g/dL — ABNORMAL LOW (ref 6.5–8.1)

## 2020-12-30 LAB — CEA (IN HOUSE-CHCC): CEA (CHCC-In House): 54.57 ng/mL — ABNORMAL HIGH (ref 0.00–5.00)

## 2020-12-30 LAB — CEA (ACCESS): CEA (CHCC): 50.95 ng/mL — ABNORMAL HIGH (ref 0.00–5.00)

## 2020-12-30 MED ORDER — SODIUM CHLORIDE 0.9 % IV SOLN
10.0000 mg | Freq: Once | INTRAVENOUS | Status: AC
  Administered 2020-12-30: 10 mg via INTRAVENOUS
  Filled 2020-12-30: qty 1

## 2020-12-30 MED ORDER — SODIUM CHLORIDE 0.9 % IV SOLN
140.0000 mg/m2 | Freq: Once | INTRAVENOUS | Status: AC
  Administered 2020-12-30: 220 mg via INTRAVENOUS
  Filled 2020-12-30: qty 11

## 2020-12-30 MED ORDER — PALONOSETRON HCL INJECTION 0.25 MG/5ML
0.2500 mg | Freq: Once | INTRAVENOUS | Status: AC
  Administered 2020-12-30: 0.25 mg via INTRAVENOUS
  Filled 2020-12-30: qty 5

## 2020-12-30 MED ORDER — OXYCODONE-ACETAMINOPHEN 5-325 MG PO TABS: 1.0000 | ORAL_TABLET | ORAL | 0 refills | Status: DC | PRN

## 2020-12-30 MED ORDER — ATROPINE SULFATE 1 MG/ML IJ SOLN
0.5000 mg | Freq: Once | INTRAMUSCULAR | Status: AC
  Administered 2020-12-30: 0.5 mg via INTRAVENOUS
  Filled 2020-12-30: qty 1

## 2020-12-30 MED ORDER — SODIUM CHLORIDE 0.9 % IV SOLN
1600.0000 mg/m2 | INTRAVENOUS | Status: DC
  Administered 2020-12-30: 2550 mg via INTRAVENOUS
  Filled 2020-12-30: qty 51

## 2020-12-30 MED ORDER — SODIUM CHLORIDE 0.9 % IV SOLN
200.0000 mg/m2 | Freq: Once | INTRAVENOUS | Status: AC
  Administered 2020-12-30: 318 mg via INTRAVENOUS
  Filled 2020-12-30: qty 15.9

## 2020-12-30 MED ORDER — SODIUM CHLORIDE 0.9 % IV SOLN
Freq: Once | INTRAVENOUS | Status: AC
  Filled 2020-12-30: qty 250

## 2020-12-30 MED ORDER — SODIUM CHLORIDE 0.9 % IV SOLN
5.2000 mg/kg | Freq: Once | INTRAVENOUS | Status: AC
  Administered 2020-12-30: 300 mg via INTRAVENOUS
  Filled 2020-12-30: qty 12

## 2020-12-30 NOTE — Progress Notes (Signed)
[facility] OFFICE PROGRESS NOTE   Diagnosis: Colon cancer  INTERVAL HISTORY:   Ms. Leggitt completed another cycle of FOLFIRI/Avastin on 12/09/2020.  She had mild diarrhea during the week following chemotherapy.  Good appetite.  She takes oxycodone in the evening for low back pain.  She has mild numbness in the feet.  No nausea.  She plans to return to work in July.  Ms. Rung has noted prominence at the left sternoclavicular region.  No associated pain.  Objective:  Vital signs in last 24 hours:  Blood pressure (!) 113/55, pulse 62, temperature 97.9 F (36.6 C), temperature source Oral, resp. rate 18, height $RemoveBe'5\' 7"'HSGboNSBN$  (1.702 m), weight 122 lb 9.6 oz (55.6 kg), SpO2 100 %.    HEENT: No thrush or ulcers Resp: Lungs clear bilaterally Cardio: Regular rate and rhythm GI: No hepatosplenomegaly, no mass, induration at subcutaneous injection sites Vascular: No leg edema Musculoskeletal: Bony prominence at the left sternoclavicular joint.  Portacath/PICC-without erythema  Lab Results:  Lab Results  Component Value Date   WBC 6.3 12/30/2020   HGB 9.0 (L) 12/30/2020   HCT 29.9 (L) 12/30/2020   MCV 104.2 (H) 12/30/2020   PLT 134 (L) 12/30/2020   NEUTROABS 4.0 12/30/2020    CMP  Lab Results  Component Value Date   NA 140 12/09/2020   K 4.5 12/09/2020   CL 109 12/09/2020   CO2 24 12/09/2020   GLUCOSE 72 12/09/2020   BUN 20 12/09/2020   CREATININE 0.53 12/09/2020   CALCIUM 8.7 (L) 12/09/2020   PROT 5.7 (L) 12/09/2020   ALBUMIN 3.4 (L) 12/09/2020   AST 31 12/09/2020   ALT 22 12/09/2020   ALKPHOS 177 (H) 12/09/2020   BILITOT 0.2 (L) 12/09/2020   GFRNONAA >60 12/09/2020   GFRAA >60 05/06/2020    Lab Results  Component Value Date   CEA1 58.78 (H) 12/09/2020     Medications: I have reviewed the patient's current medications.   Assessment/Plan: 1. 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 Cirrhosis. Innumerable liver lesions.  Colonoscopy 12/18/2019-ulcerated partially obstructing large mass in the mid ascending colon. Biopsy invasive adenocarcinoma; preserved expression major MMR proteins  Upper endoscopy 12/18/2019-normal esophagus, stomach, examined duodenum  CEA 12/19/2019-8,923  Biopsy liver lesion 12/23/2019-adenocarcinoma  Foundation 1-K-ras wild-type; tumor mutation burden greater than or equal to 10; microsatellite stable; BRAF V600E  Cycle 1 FOLFOX 01/01/2020  Cycle 2 FOLFOX 01/15/2020  Cycle 3 FOLFOX 01/29/2020, Udenyca added  Cycle 4 FOLFOX 02/12/2020, Udenyca held  Cycle 5 FOLFOX 02/26/2020, Udenyca  Cycle 6 FOLFOX 03/11/2020, Udenyca held  CT abdomen/pelvis 03/16/2020-stable diffuse liver lesions, stable strictured appearing ascending colon, possible pericolonic implant, vague left lower lobe nodule  Cycle 1 FOLFIRI/Avastin 04/06/2020  Cycle 2 FOLFIRI/Avastin 04/21/2020  Cycle 3 FOLFIRI/Avastin 05/06/2020  Cycle4FOLFIRI/Avastin 05/20/2020  Cycle 5 FOLFIRI/Avastin 06/03/2020  CTs 06/15/2020-mild to moderate improvement in hepatic metastasis. No new or progressive disease. Narrowing within the proximal ascending colon with upstream mild cecal and terminal ileum dilatation, similar.  Cycle 6 FOLFIRI/Avastin 06/17/2020  Cycle 7 FOLFIRI/Avastin 07/01/2020  Cycle 8 FOLFIRI/Avastin 07/15/2020  Cycle 9 FOLFIRI/Avastin 07/29/2020-5-FU bolus eliminated, 5-FU infusion and irinotecan dose reduced  Cycle 10 FOLFIRI/Avastin 08/12/2020  CTs 08/23/2020-mild residual wall thickening involving the cecum/ascending colon. Numerous liver metastases improved.  Cycle 11 FOLFIRI/Avastin 08/25/2020  Cycle 12 FOLFIRI/Avastin 09/16/2020  Cycle 13 FOLFIRI/Avastin 10/07/2020  Cycle 14 FOLFIRI/Avastin 10/28/2020  Cycle 15 FOLFIRI/Avastin 11/18/2020  CT abdomen/pelvis 12/06/2020-mild improvement in multifocal hepatic metastases, wall  thickening at the ascending colon with pericolonic inflammatory changes  Cycle 16 FOLFIRI/Avastin 12/09/2020  Cycle 17 FOLFIRI/Avastin 12/30/2020 2. 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 Cirrhosis 4. 12/26/2019-hospital admission for right lower extremity DVT and GI bleed, treated with heparin anticoagulation beginning 12/26/2019, converted to Lovenox at discharge 12/29/2019 5. History of low-grade fever-likely "tumor"fever 6. COVID-19 infection January 2021       Disposition: Ms. Taha appears stable.  She will complete another cycle of FOLFIRI/Avastin today.  She will return for an office visit and chemotherapy in 3 weeks.  There is no clinical evidence of disease progression.  I refilled her prescription for oxycodone.  [name], MD  12/30/2020  8:36 AM

## 2020-12-30 NOTE — Patient Instructions (Signed)
[facility]    Discharge Instructions:  Thank you for choosing [facility] to provide your oncology and hematology care.   If you have a lab appointment with the Cancer Center, please go directly to the Cancer Center and check in at the registration area.   Wear comfortable clothing and clothing appropriate for easy access to any Portacath or PICC line.   We strive to give you quality time with your provider. You may need to reschedule your appointment if you arrive late (15 or more minutes).  Arriving late affects you and other patients whose appointments are after yours.  Also, if you miss three or more appointments without notifying the office, you may be dismissed from the clinic at the provider's discretion.      For prescription refill requests, have your pharmacy contact our office and allow 72 hours for refills to be completed.    Today you received the following chemotherapy and/or immunotherapy agents Bevacizumab-bvzr (ZIRABEV), Irinotecan (CAMPTOSAR), Leucovorin & Flourouracil (ADRUCIL).   To help prevent nausea and vomiting after your treatment, we encourage you to take your nausea medication as directed.  BELOW ARE SYMPTOMS THAT SHOULD BE REPORTED IMMEDIATELY: *FEVER GREATER THAN 100.4 F (38 C) OR HIGHER *CHILLS OR SWEATING *NAUSEA AND VOMITING THAT IS NOT CONTROLLED WITH YOUR NAUSEA MEDICATION *UNUSUAL SHORTNESS OF BREATH *UNUSUAL BRUISING OR BLEEDING *URINARY PROBLEMS (pain or burning when urinating, or frequent urination) *BOWEL PROBLEMS (unusual diarrhea, constipation, pain near the anus) TENDERNESS IN MOUTH AND THROAT WITH OR WITHOUT PRESENCE OF ULCERS (sore throat, sores in mouth, or a toothache) UNUSUAL RASH, SWELLING OR PAIN  UNUSUAL VAGINAL DISCHARGE OR ITCHING   Items with * indicate a potential emergency and should be followed up as soon as possible or go to the Emergency Department if any problems should  occur.  Please show the CHEMOTHERAPY ALERT CARD or IMMUNOTHERAPY ALERT CARD at check-in to the Emergency Department and triage nurse.  Should you have questions after your visit or need to cancel or reschedule your appointment, please contact [facility]  Dept: [phone]  and follow the prompts.  Office hours are 8:00 a.m. to 4:30 p.m. Monday - Friday. Please note that voicemails left after 4:00 p.m. may not be returned until the following business day.  We are closed weekends and major holidays. You have access to a nurse at all times for urgent questions. Please call the main number to the clinic Dept: [phone] and follow the prompts.   For any non-urgent questions, you may also contact your provider using MyChart. We now offer e-Visits for anyone 18 and older to request care online for non-urgent symptoms. For details visit [email or web address].   Also download the MyChart app! Go to the app store, search "MyChart", open the app, select , and log in with your MyChart username and password.  Due to Covid, a mask is required upon entering the hospital/clinic. If you do not have a mask, one will be given to you upon arrival. For doctor visits, patients may have 1 support person aged 18 or older with them. For treatment visits, patients cannot have anyone with them due to current Covid guidelines and our immunocompromised population.   Bevacizumab injection What is this medicine? BEVACIZUMAB (be va SIZ yoo mab) is a monoclonal antibody. It is used to treat many types of cancer. This medicine may be used for other purposes; ask your health care provider or pharmacist if you   have questions. COMMON BRAND NAME(S): Avastin, MVASI, Zirabev What should I tell my health care provider before I take this medicine? They need to know if you have any of these conditions: diabetes heart disease high blood pressure history of coughing up blood prior  anthracycline chemotherapy (e.g., doxorubicin, daunorubicin, epirubicin) recent or ongoing radiation therapy recent or planning to have surgery stroke an unusual or allergic reaction to bevacizumab, hamster proteins, mouse proteins, other medicines, foods, dyes, or preservatives pregnant or trying to get pregnant breast-feeding How should I use this medicine? This medicine is for infusion into a vein. It is given by a health care professional in a hospital or clinic setting. Talk to your pediatrician regarding the use of this medicine in children. Special care may be needed. Overdosage: If you think you have taken too much of this medicine contact a poison control center or emergency room at once. NOTE: This medicine is only for you. Do not share this medicine with others. What if I miss a dose? It is important not to miss your dose. Call your doctor or health care professional if you are unable to keep an appointment. What may interact with this medicine? Interactions are not expected. This list may not describe all possible interactions. Give your health care provider a list of all the medicines, herbs, non-prescription drugs, or dietary supplements you use. Also tell them if you smoke, drink alcohol, or use illegal drugs. Some items may interact with your medicine. What should I watch for while using this medicine? Your condition will be monitored carefully while you are receiving this medicine. You will need important blood work and urine testing done while you are taking this medicine. This medicine may increase your risk to bruise or bleed. Call your doctor or health care professional if you notice any unusual bleeding. Before having surgery, talk to your health care provider to make sure it is ok. This drug can increase the risk of poor healing of your surgical site or wound. You will need to stop this drug for 28 days before surgery. After surgery, wait at least 28 days before restarting  this drug. Make sure the surgical site or wound is healed enough before restarting this drug. Talk to your health care provider if questions. Do not become pregnant while taking this medicine or for 6 months after stopping it. Women should inform their doctor if they wish to become pregnant or think they might be pregnant. There is a potential for serious side effects to an unborn child. Talk to your health care professional or pharmacist for more information. Do not breast-feed an infant while taking this medicine and for 6 months after the last dose. This medicine has caused ovarian failure in some women. This medicine may interfere with the ability to have a child. You should talk to your doctor or health care professional if you are concerned about your fertility. What side effects may I notice from receiving this medicine? Side effects that you should report to your doctor or health care professional as soon as possible: allergic reactions like skin rash, itching or hives, swelling of the face, lips, or tongue chest pain or chest tightness chills coughing up blood high fever seizures severe constipation signs and symptoms of bleeding such as bloody or black, tarry stools; red or dark-Kersten urine; spitting up blood or Brizzi material that looks like coffee grounds; red spots on the skin; unusual bruising or bleeding from the eye, gums, or nose signs and symptoms of   a blood clot such as breathing problems; chest pain; severe, sudden headache; pain, swelling, warmth in the leg signs and symptoms of a stroke like changes in vision; confusion; trouble speaking or understanding; severe headaches; sudden numbness or weakness of the face, arm or leg; trouble walking; dizziness; loss of balance or coordination stomach pain sweating swelling of legs or ankles vomiting weight gain Side effects that usually do not require medical attention (report to your doctor or health care professional if they  continue or are bothersome): back pain changes in taste decreased appetite dry skin nausea tiredness This list may not describe all possible side effects. Call your doctor for medical advice about side effects. You may report side effects to FDA at 1-800-FDA-1088. Where should I keep my medicine? This drug is given in a hospital or clinic and will not be stored at home. NOTE: This sheet is a summary. It may not cover all possible information. If you have questions about this medicine, talk to your doctor, pharmacist, or health care provider.  2021 Elsevier/Gold Standard (2019-05-28 10:50:46)  Irinotecan injection What is this medicine? IRINOTECAN (ir in oh TEE kan ) is a chemotherapy drug. It is used to treat colon and rectal cancer. This medicine may be used for other purposes; ask your health care provider or pharmacist if you have questions. COMMON BRAND NAME(S): Camptosar What should I tell my health care provider before I take this medicine? They need to know if you have any of these conditions: dehydration diarrhea infection (especially a virus infection such as chickenpox, cold sores, or herpes) liver disease low blood counts, like low white cell, platelet, or red cell counts low levels of calcium, magnesium, or potassium in the blood recent or ongoing radiation therapy an unusual or allergic reaction to irinotecan, other medicines, foods, dyes, or preservatives pregnant or trying to get pregnant breast-feeding How should I use this medicine? This drug is given as an infusion into a vein. It is administered in a hospital or clinic by a specially trained health care professional. Talk to your pediatrician regarding the use of this medicine in children. Special care may be needed. Overdosage: If you think you have taken too much of this medicine contact a poison control center or emergency room at once. NOTE: This medicine is only for you. Do not share this medicine with  others. What if I miss a dose? It is important not to miss your dose. Call your doctor or health care professional if you are unable to keep an appointment. What may interact with this medicine? Do not take this medicine with any of the following medications: cobicistat itraconazole This medicine may interact with the following medications: antiviral medicines for HIV or AIDS certain antibiotics like rifampin or rifabutin certain medicines for fungal infections like ketoconazole, posaconazole, and voriconazole certain medicines for seizures like carbamazepine, phenobarbital, phenotoin clarithromycin gemfibrozil nefazodone St. John's Wort This list may not describe all possible interactions. Give your health care provider a list of all the medicines, herbs, non-prescription drugs, or dietary supplements you use. Also tell them if you smoke, drink alcohol, or use illegal drugs. Some items may interact with your medicine. What should I watch for while using this medicine? Your condition will be monitored carefully while you are receiving this medicine. You will need important blood work done while you are taking this medicine. This drug may make you feel generally unwell. This is not uncommon, as chemotherapy can affect healthy cells as well as cancer cells.   Report any side effects. Continue your course of treatment even though you feel ill unless your doctor tells you to stop. In some cases, you may be given additional medicines to help with side effects. Follow all directions for their use. You may get drowsy or dizzy. Do not drive, use machinery, or do anything that needs mental alertness until you know how this medicine affects you. Do not stand or sit up quickly, especially if you are an older patient. This reduces the risk of dizzy or fainting spells. Call your health care professional for advice if you get a fever, chills, or sore throat, or other symptoms of a cold or flu. Do not treat  yourself. This medicine decreases your body's ability to fight infections. Try to avoid being around people who are sick. Avoid taking products that contain aspirin, acetaminophen, ibuprofen, naproxen, or ketoprofen unless instructed by your doctor. These medicines may hide a fever. This medicine may increase your risk to bruise or bleed. Call your doctor or health care professional if you notice any unusual bleeding. Be careful brushing and flossing your teeth or using a toothpick because you may get an infection or bleed more easily. If you have any dental work done, tell your dentist you are receiving this medicine. Do not become pregnant while taking this medicine or for 6 months after stopping it. Women should inform their health care professional if they wish to become pregnant or think they might be pregnant. Men should not father a child while taking this medicine and for 3 months after stopping it. There is potential for serious side effects to an unborn child. Talk to your health care professional for more information. Do not breast-feed an infant while taking this medicine or for 7 days after stopping it. This medicine has caused ovarian failure in some women. This medicine may make it more difficult to get pregnant. Talk to your health care professional if you are concerned about your fertility. This medicine has caused decreased sperm counts in some men. This may make it more difficult to father a child. Talk to your health care professional if you are concerned about your fertility. What side effects may I notice from receiving this medicine? Side effects that you should report to your doctor or health care professional as soon as possible: allergic reactions like skin rash, itching or hives, swelling of the face, lips, or tongue chest pain diarrhea flushing, runny nose, sweating during infusion low blood counts - this medicine may decrease the number of white blood cells, red blood cells  and platelets. You may be at increased risk for infections and bleeding. nausea, vomiting pain, swelling, warmth in the leg signs of decreased platelets or bleeding - bruising, pinpoint red spots on the skin, black, tarry stools, blood in the urine signs of infection - fever or chills, cough, sore throat, pain or difficulty passing urine signs of decreased red blood cells - unusually weak or tired, fainting spells, lightheadedness Side effects that usually do not require medical attention (report to your doctor or health care professional if they continue or are bothersome): constipation hair loss headache loss of appetite mouth sores stomach pain This list may not describe all possible side effects. Call your doctor for medical advice about side effects. You may report side effects to FDA at 1-800-FDA-1088. Where should I keep my medicine? This drug is given in a hospital or clinic and will not be stored at home. NOTE: This sheet is a summary. It may   not cover all possible information. If you have questions about this medicine, talk to your doctor, pharmacist, or health care provider.  2021 Elsevier/Gold Standard (2019-07-01 17:46:13)  Leucovorin injection What is this medicine? LEUCOVORIN (loo koe VOR in) is used to prevent or treat the harmful effects of some medicines. This medicine is used to treat anemia caused by a low amount of folic acid in the body. It is also used with 5-fluorouracil (5-FU) to treat colon cancer. This medicine may be used for other purposes; ask your health care provider or pharmacist if you have questions. What should I tell my health care provider before I take this medicine? They need to know if you have any of these conditions: anemia from low levels of vitamin B-12 in the blood an unusual or allergic reaction to leucovorin, folic acid, other medicines, foods, dyes, or preservatives pregnant or trying to get pregnant breast-feeding How should I use this  medicine? This medicine is for injection into a muscle or into a vein. It is given by a health care professional in a hospital or clinic setting. Talk to your pediatrician regarding the use of this medicine in children. Special care may be needed. Overdosage: If you think you have taken too much of this medicine contact a poison control center or emergency room at once. NOTE: This medicine is only for you. Do not share this medicine with others. What if I miss a dose? This does not apply. What may interact with this medicine? capecitabine fluorouracil phenobarbital phenytoin primidone trimethoprim-sulfamethoxazole This list may not describe all possible interactions. Give your health care provider a list of all the medicines, herbs, non-prescription drugs, or dietary supplements you use. Also tell them if you smoke, drink alcohol, or use illegal drugs. Some items may interact with your medicine. What should I watch for while using this medicine? Your condition will be monitored carefully while you are receiving this medicine. This medicine may increase the side effects of 5-fluorouracil, 5-FU. Tell your doctor or health care professional if you have diarrhea or mouth sores that do not get better or that get worse. What side effects may I notice from receiving this medicine? Side effects that you should report to your doctor or health care professional as soon as possible: allergic reactions like skin rash, itching or hives, swelling of the face, lips, or tongue breathing problems fever, infection mouth sores unusual bleeding or bruising unusually weak or tired Side effects that usually do not require medical attention (report to your doctor or health care professional if they continue or are bothersome): constipation or diarrhea loss of appetite nausea, vomiting This list may not describe all possible side effects. Call your doctor for medical advice about side effects. You may report  side effects to FDA at 1-800-FDA-1088. Where should I keep my medicine? This drug is given in a hospital or clinic and will not be stored at home. NOTE: This sheet is a summary. It may not cover all possible information. If you have questions about this medicine, talk to your doctor, pharmacist, or health care provider.  2021 Elsevier/Gold Standard (2008-02-04 16:50:29)  Fluorouracil, 5-FU injection What is this medicine? FLUOROURACIL, 5-FU (flure oh YOOR a sil) is a chemotherapy drug. It slows the growth of cancer cells. This medicine is used to treat many types of cancer like breast cancer, colon or rectal cancer, pancreatic cancer, and stomach cancer. This medicine may be used for other purposes; ask your health care provider or pharmacist if you   have questions. COMMON BRAND NAME(S): Adrucil What should I tell my health care provider before I take this medicine? They need to know if you have any of these conditions: blood disorders dihydropyrimidine dehydrogenase (DPD) deficiency infection (especially a virus infection such as chickenpox, cold sores, or herpes) kidney disease liver disease malnourished, poor nutrition recent or ongoing radiation therapy an unusual or allergic reaction to fluorouracil, other chemotherapy, other medicines, foods, dyes, or preservatives pregnant or trying to get pregnant breast-feeding How should I use this medicine? This drug is given as an infusion or injection into a vein. It is administered in a hospital or clinic by a specially trained health care professional. Talk to your pediatrician regarding the use of this medicine in children. Special care may be needed. Overdosage: If you think you have taken too much of this medicine contact a poison control center or emergency room at once. NOTE: This medicine is only for you. Do not share this medicine with others. What if I miss a dose? It is important not to miss your dose. Call your doctor or health  care professional if you are unable to keep an appointment. What may interact with this medicine? Do not take this medicine with any of the following medications: live virus vaccines This medicine may also interact with the following medications: medicines that treat or prevent blood clots like warfarin, enoxaparin, and dalteparin This list may not describe all possible interactions. Give your health care provider a list of all the medicines, herbs, non-prescription drugs, or dietary supplements you use. Also tell them if you smoke, drink alcohol, or use illegal drugs. Some items may interact with your medicine. What should I watch for while using this medicine? Visit your doctor for checks on your progress. This drug may make you feel generally unwell. This is not uncommon, as chemotherapy can affect healthy cells as well as cancer cells. Report any side effects. Continue your course of treatment even though you feel ill unless your doctor tells you to stop. In some cases, you may be given additional medicines to help with side effects. Follow all directions for their use. Call your doctor or health care professional for advice if you get a fever, chills or sore throat, or other symptoms of a cold or flu. Do not treat yourself. This drug decreases your body's ability to fight infections. Try to avoid being around people who are sick. This medicine may increase your risk to bruise or bleed. Call your doctor or health care professional if you notice any unusual bleeding. Be careful brushing and flossing your teeth or using a toothpick because you may get an infection or bleed more easily. If you have any dental work done, tell your dentist you are receiving this medicine. Avoid taking products that contain aspirin, acetaminophen, ibuprofen, naproxen, or ketoprofen unless instructed by your doctor. These medicines may hide a fever. Do not become pregnant while taking this medicine. Women should inform  their doctor if they wish to become pregnant or think they might be pregnant. There is a potential for serious side effects to an unborn child. Talk to your health care professional or pharmacist for more information. Do not breast-feed an infant while taking this medicine. Men should inform their doctor if they wish to father a child. This medicine may lower sperm counts. Do not treat diarrhea with over the counter products. Contact your doctor if you have diarrhea that lasts more than 2 days or if it is severe and watery.   This medicine can make you more sensitive to the sun. Keep out of the sun. If you cannot avoid being in the sun, wear protective clothing and use sunscreen. Do not use sun lamps or tanning beds/booths. What side effects may I notice from receiving this medicine? Side effects that you should report to your doctor or health care professional as soon as possible: allergic reactions like skin rash, itching or hives, swelling of the face, lips, or tongue low blood counts - this medicine may decrease the number of white blood cells, red blood cells and platelets. You may be at increased risk for infections and bleeding. signs of infection - fever or chills, cough, sore throat, pain or difficulty passing urine signs of decreased platelets or bleeding - bruising, pinpoint red spots on the skin, black, tarry stools, blood in the urine signs of decreased red blood cells - unusually weak or tired, fainting spells, lightheadedness breathing problems changes in vision chest pain mouth sores nausea and vomiting pain, swelling, redness at site where injected pain, tingling, numbness in the hands or feet redness, swelling, or sores on hands or feet stomach pain unusual bleeding Side effects that usually do not require medical attention (report to your doctor or health care professional if they continue or are bothersome): changes in finger or toe nails diarrhea dry or itchy skin hair  loss headache loss of appetite sensitivity of eyes to the light stomach upset unusually teary eyes This list may not describe all possible side effects. Call your doctor for medical advice about side effects. You may report side effects to FDA at 1-800-FDA-1088. Where should I keep my medicine? This drug is given in a hospital or clinic and will not be stored at home. NOTE: This sheet is a summary. It may not cover all possible information. If you have questions about this medicine, talk to your doctor, pharmacist, or health care provider.  2021 Elsevier/Gold Standard (2019-07-01 15:00:03)  

## 2020-12-31 ENCOUNTER — Encounter: Payer: Self-pay | Admitting: *Deleted

## 2020-12-31 NOTE — Progress Notes (Signed)
Completed [employer] disability form and faxed to [phone]. Copy to HIM and will provide patient her copy at next appointment. Return to work form will stay on file until her appointment on 6/30 and MD will complete for her anticipated return to work of 02/21/21.

## 2021-01-01 ENCOUNTER — Other Ambulatory Visit: Payer: Self-pay

## 2021-01-01 ENCOUNTER — Inpatient Hospital Stay: Payer: BC Managed Care – PPO

## 2021-01-01 VITALS — BP 104/49 | HR 64 | Temp 97.4°F | Resp 17

## 2021-01-01 DIAGNOSIS — Z5112 Encounter for antineoplastic immunotherapy: Secondary | ICD-10-CM | POA: Diagnosis not present

## 2021-01-01 DIAGNOSIS — C182 Malignant neoplasm of ascending colon: Secondary | ICD-10-CM

## 2021-01-01 MED ORDER — HEPARIN SOD (PORK) LOCK FLUSH 100 UNIT/ML IV SOLN
500.0000 [IU] | Freq: Once | INTRAVENOUS | Status: AC | PRN
  Administered 2021-01-01: 500 [IU]
  Filled 2021-01-01: qty 5

## 2021-01-01 MED ORDER — PEGFILGRASTIM-CBQV 6 MG/0.6ML ~~LOC~~ SOSY
6.0000 mg | PREFILLED_SYRINGE | Freq: Once | SUBCUTANEOUS | Status: AC
  Administered 2021-01-01: 6 mg via SUBCUTANEOUS

## 2021-01-01 MED ORDER — SODIUM CHLORIDE 0.9% FLUSH
10.0000 mL | INTRAVENOUS | Status: DC | PRN
  Administered 2021-01-01: 10 mL
  Filled 2021-01-01: qty 10

## 2021-01-13 ENCOUNTER — Other Ambulatory Visit: Payer: Self-pay | Admitting: Oncology

## 2021-01-14 ENCOUNTER — Other Ambulatory Visit: Payer: Self-pay | Admitting: Oncology

## 2021-01-14 DIAGNOSIS — D649 Anemia, unspecified: Secondary | ICD-10-CM

## 2021-01-20 ENCOUNTER — Inpatient Hospital Stay: Payer: BC Managed Care – PPO | Attending: Oncology

## 2021-01-20 ENCOUNTER — Encounter: Payer: Self-pay | Admitting: Nurse Practitioner

## 2021-01-20 ENCOUNTER — Inpatient Hospital Stay: Payer: BC Managed Care – PPO

## 2021-01-20 ENCOUNTER — Other Ambulatory Visit: Payer: Self-pay

## 2021-01-20 ENCOUNTER — Inpatient Hospital Stay (HOSPITAL_BASED_OUTPATIENT_CLINIC_OR_DEPARTMENT_OTHER): Payer: BC Managed Care – PPO | Admitting: Nurse Practitioner

## 2021-01-20 VITALS — BP 124/54 | HR 68 | Temp 97.8°F | Resp 18 | Ht 67.0 in | Wt 124.8 lb

## 2021-01-20 DIAGNOSIS — C182 Malignant neoplasm of ascending colon: Secondary | ICD-10-CM | POA: Insufficient documentation

## 2021-01-20 DIAGNOSIS — C787 Secondary malignant neoplasm of liver and intrahepatic bile duct: Secondary | ICD-10-CM | POA: Diagnosis not present

## 2021-01-20 DIAGNOSIS — I82401 Acute embolism and thrombosis of unspecified deep veins of right lower extremity: Secondary | ICD-10-CM | POA: Insufficient documentation

## 2021-01-20 DIAGNOSIS — Z5112 Encounter for antineoplastic immunotherapy: Secondary | ICD-10-CM | POA: Insufficient documentation

## 2021-01-20 DIAGNOSIS — Z5189 Encounter for other specified aftercare: Secondary | ICD-10-CM | POA: Diagnosis not present

## 2021-01-20 DIAGNOSIS — Z452 Encounter for adjustment and management of vascular access device: Secondary | ICD-10-CM | POA: Diagnosis not present

## 2021-01-20 DIAGNOSIS — D696 Thrombocytopenia, unspecified: Secondary | ICD-10-CM | POA: Diagnosis not present

## 2021-01-20 DIAGNOSIS — Z7901 Long term (current) use of anticoagulants: Secondary | ICD-10-CM | POA: Diagnosis not present

## 2021-01-20 DIAGNOSIS — K7460 Unspecified cirrhosis of liver: Secondary | ICD-10-CM | POA: Insufficient documentation

## 2021-01-20 DIAGNOSIS — D500 Iron deficiency anemia secondary to blood loss (chronic): Secondary | ICD-10-CM | POA: Insufficient documentation

## 2021-01-20 DIAGNOSIS — Z86718 Personal history of other venous thrombosis and embolism: Secondary | ICD-10-CM | POA: Diagnosis not present

## 2021-01-20 DIAGNOSIS — Z5111 Encounter for antineoplastic chemotherapy: Secondary | ICD-10-CM | POA: Insufficient documentation

## 2021-01-20 LAB — CMP (CANCER CENTER ONLY)
ALT: 43 U/L (ref 0–44)
AST: 51 U/L — ABNORMAL HIGH (ref 15–41)
Albumin: 3.3 g/dL — ABNORMAL LOW (ref 3.5–5.0)
Alkaline Phosphatase: 210 U/L — ABNORMAL HIGH (ref 38–126)
Anion gap: 7 (ref 5–15)
BUN: 19 mg/dL (ref 8–23)
CO2: 25 mmol/L (ref 22–32)
Calcium: 8.5 mg/dL — ABNORMAL LOW (ref 8.9–10.3)
Chloride: 110 mmol/L (ref 98–111)
Creatinine: 0.61 mg/dL (ref 0.44–1.00)
GFR, Estimated: >60 mL/min (ref >60)
Glucose, Bld: 66 mg/dL — ABNORMAL LOW (ref 70–99)
Potassium: 4.4 mmol/L (ref 3.5–5.1)
Sodium: 142 mmol/L (ref 135–145)
Total Bilirubin: 0.3 mg/dL (ref 0.3–1.2)
Total Protein: 5.6 g/dL — ABNORMAL LOW (ref 6.5–8.1)

## 2021-01-20 LAB — CBC WITH DIFFERENTIAL (CANCER CENTER ONLY)
Abs Immature Granulocytes: 0.03 10*3/uL (ref 0.00–0.07)
Basophils Absolute: 0.1 10*3/uL (ref 0.0–0.1)
Basophils Relative: 1 %
Eosinophils Absolute: 0.2 10*3/uL (ref 0.0–0.5)
Eosinophils Relative: 3 %
HCT: 30 % — ABNORMAL LOW (ref 36.0–46.0)
Hemoglobin: 9.3 g/dL — ABNORMAL LOW (ref 12.0–15.0)
Immature Granulocytes: 1 %
Lymphocytes Relative: 17 %
Lymphs Abs: 1.1 10*3/uL (ref 0.7–4.0)
MCH: 32.3 pg (ref 26.0–34.0)
MCHC: 31 g/dL (ref 30.0–36.0)
MCV: 104.2 fL — ABNORMAL HIGH (ref 80.0–100.0)
Monocytes Absolute: 0.8 10*3/uL (ref 0.1–1.0)
Monocytes Relative: 13 %
Neutro Abs: 4.2 10*3/uL (ref 1.7–7.7)
Neutrophils Relative %: 65 %
Platelet Count: 139 10*3/uL — ABNORMAL LOW (ref 150–400)
RBC: 2.88 MIL/uL — ABNORMAL LOW (ref 3.87–5.11)
RDW: 16.7 % — ABNORMAL HIGH (ref 11.5–15.5)
WBC Count: 6.4 10*3/uL (ref 4.0–10.5)
nRBC: 0 % (ref 0.0–0.2)

## 2021-01-20 LAB — TOTAL PROTEIN, URINE DIPSTICK: Protein, ur: NEGATIVE mg/dL

## 2021-01-20 LAB — CEA (ACCESS): CEA (CHCC): 40.69 ng/mL — ABNORMAL HIGH (ref 0.00–5.00)

## 2021-01-20 LAB — MAGNESIUM: Magnesium: 2 mg/dL (ref 1.7–2.4)

## 2021-01-20 MED ORDER — SODIUM CHLORIDE 0.9 % IV SOLN
10.0000 mg | Freq: Once | INTRAVENOUS | Status: AC
  Administered 2021-01-20: 10 mg via INTRAVENOUS
  Filled 2021-01-20: qty 1

## 2021-01-20 MED ORDER — PALONOSETRON HCL INJECTION 0.25 MG/5ML
0.2500 mg | Freq: Once | INTRAVENOUS | Status: AC
Start: 2021-01-20 — End: 2021-01-20
  Administered 2021-01-20: 0.25 mg via INTRAVENOUS
  Filled 2021-01-20: qty 5

## 2021-01-20 MED ORDER — SODIUM CHLORIDE 0.9 % IV SOLN
140.0000 mg/m2 | Freq: Once | INTRAVENOUS | Status: AC
  Administered 2021-01-20: 220 mg via INTRAVENOUS
  Filled 2021-01-20: qty 11

## 2021-01-20 MED ORDER — ATROPINE SULFATE 1 MG/ML IJ SOLN
0.5000 mg | Freq: Once | INTRAMUSCULAR | Status: AC | PRN
  Administered 2021-01-20: 0.5 mg via INTRAVENOUS
  Filled 2021-01-20: qty 1

## 2021-01-20 MED ORDER — SODIUM CHLORIDE 0.9 % IV SOLN
1600.0000 mg/m2 | INTRAVENOUS | Status: DC
  Administered 2021-01-20: 2550 mg via INTRAVENOUS
  Filled 2021-01-20: qty 51

## 2021-01-20 MED ORDER — SODIUM CHLORIDE 0.9 % IV SOLN
5.0000 mg/kg | Freq: Once | INTRAVENOUS | Status: AC
  Administered 2021-01-20: 275 mg via INTRAVENOUS
  Filled 2021-01-20: qty 11

## 2021-01-20 MED ORDER — SODIUM CHLORIDE 0.9 % IV SOLN
200.0000 mg/m2 | Freq: Once | INTRAVENOUS | Status: AC
  Administered 2021-01-20: 318 mg via INTRAVENOUS
  Filled 2021-01-20: qty 15.9

## 2021-01-20 MED ORDER — SODIUM CHLORIDE 0.9% FLUSH
10.0000 mL | INTRAVENOUS | Status: DC | PRN
  Administered 2021-01-20: 10 mL
  Filled 2021-01-20: qty 10

## 2021-01-20 MED ORDER — SODIUM CHLORIDE 0.9 % IV SOLN
Freq: Once | INTRAVENOUS | Status: AC
Start: 2021-01-20 — End: 2021-01-20
  Filled 2021-01-20: qty 250

## 2021-01-20 NOTE — Patient Instructions (Signed)
Stacy Burns    Discharge Instructions:  Thank you for choosing [facility] to provide your oncology and hematology care.   If you have a lab appointment with the [facility], please go directly to the [facility] and check in at the registration area.   Wear comfortable clothing and clothing appropriate for easy access to any Portacath or PICC line.   We strive to give you quality time with your provider. You may need to reschedule your appointment if you arrive late (15 or more minutes).  Arriving late affects you and other patients whose appointments are after yours.  Also, if you miss three or more appointments without notifying the office, you may be dismissed from the clinic at the provider's discretion.      For prescription refill requests, have your pharmacy contact our office and allow 72 hours for refills to be completed.    Today you received the following chemotherapy and/or immunotherapy agents Bevacizumab-bvzr (ZIRABEV), Irinotecan (CAMPTOSAR), Leucovorin & Flourouracil (ADRUCIL).   To help prevent nausea and vomiting after your treatment, we encourage you to take your nausea medication as directed.  BELOW ARE SYMPTOMS THAT SHOULD BE REPORTED IMMEDIATELY: *FEVER GREATER THAN 100.4 F (38 C) OR HIGHER *CHILLS OR SWEATING *NAUSEA AND VOMITING THAT IS NOT CONTROLLED WITH YOUR NAUSEA MEDICATION *UNUSUAL SHORTNESS OF BREATH *UNUSUAL BRUISING OR BLEEDING *URINARY PROBLEMS (pain or burning when urinating, or frequent urination) *BOWEL PROBLEMS (unusual diarrhea, constipation, pain near the anus) TENDERNESS IN MOUTH AND THROAT WITH OR WITHOUT PRESENCE OF ULCERS (sore throat, sores in mouth, or a toothache) UNUSUAL RASH, SWELLING OR PAIN  UNUSUAL VAGINAL DISCHARGE OR ITCHING   Items with * indicate a potential emergency and should be followed up as soon as possible or go to the Emergency Department if any problems should  occur.  Please show the CHEMOTHERAPY ALERT CARD or IMMUNOTHERAPY ALERT CARD at check-in to the Emergency Department and triage nurse.  Should you have questions after your visit or need to cancel or reschedule your appointment, please contact [facility]  Dept: [phone]  and follow the prompts.  Office hours are 8:00 a.m. to 4:30 p.m. Monday - Friday. Please note that voicemails left after 4:00 p.m. may not be returned until the following business day.  We are closed weekends and major holidays. You have access to a nurse at all times for urgent questions. Please call the main number to the clinic Dept: [phone] and follow the prompts.   For any non-urgent questions, you may also contact your provider using MyChart. We now offer e-Visits for anyone 38 and older to request care online for non-urgent symptoms. For details visit [email or web address].   Also download the MyChart app! Go to the app store, search "MyChart", open the app, select [facility], and log in with your MyChart username and password.  Due to Covid, a mask is required upon entering the hospital/clinic. If you do not have a mask, one will be given to you upon arrival. For doctor visits, patients may have 1 support person aged 36 or older with them. For treatment visits, patients cannot have anyone with them due to current Covid guidelines and our immunocompromised population.   Bevacizumab injection What is this medicine? BEVACIZUMAB (be va SIZ yoo mab) is a monoclonal antibody. It is used to treat many types of cancer. This medicine may be used for other purposes; ask your health care provider or pharmacist if you  have questions. COMMON BRAND NAME(S): Avastin, MVASI, Zirabev What should I tell my health care provider before I take this medicine? They need to know if you have any of these conditions: diabetes heart disease high blood pressure history of coughing up blood prior  anthracycline chemotherapy (e.g., doxorubicin, daunorubicin, epirubicin) recent or ongoing radiation therapy recent or planning to have surgery stroke an unusual or allergic reaction to bevacizumab, hamster proteins, mouse proteins, other medicines, foods, dyes, or preservatives pregnant or trying to get pregnant breast-feeding How should I use this medicine? This medicine is for infusion into a vein. It is given by a health care professional in a hospital or clinic setting. Talk to your pediatrician regarding the use of this medicine in children. Special care may be needed. Overdosage: If you think you have taken too much of this medicine contact a poison control center or emergency room at once. NOTE: This medicine is only for you. Do not share this medicine with others. What if I miss a dose? It is important not to miss your dose. Call your doctor or health care professional if you are unable to keep an appointment. What may interact with this medicine? Interactions are not expected. This list may not describe all possible interactions. Give your health care provider a list of all the medicines, herbs, non-prescription drugs, or dietary supplements you use. Also tell them if you smoke, drink alcohol, or use illegal drugs. Some items may interact with your medicine. What should I watch for while using this medicine? Your condition will be monitored carefully while you are receiving this medicine. You will need important blood work and urine testing done while you are taking this medicine. This medicine may increase your risk to bruise or bleed. Call your doctor or health care professional if you notice any unusual bleeding. Before having surgery, talk to your health care provider to make sure it is ok. This drug can increase the risk of poor healing of your surgical site or wound. You will need to stop this drug for 28 days before surgery. After surgery, wait at least 28 days before restarting  this drug. Make sure the surgical site or wound is healed enough before restarting this drug. Talk to your health care provider if questions. Do not become pregnant while taking this medicine or for 6 months after stopping it. Women should inform their doctor if they wish to become pregnant or think they might be pregnant. There is a potential for serious side effects to an unborn child. Talk to your health care professional or pharmacist for more information. Do not breast-feed an infant while taking this medicine and for 6 months after the last dose. This medicine has caused ovarian failure in some women. This medicine may interfere with the ability to have a child. You should talk to your doctor or health care professional if you are concerned about your fertility. What side effects may I notice from receiving this medicine? Side effects that you should report to your doctor or health care professional as soon as possible: allergic reactions like skin rash, itching or hives, swelling of the face, lips, or tongue chest pain or chest tightness chills coughing up blood high fever seizures severe constipation signs and symptoms of bleeding such as bloody or black, tarry stools; red or dark-Standre urine; spitting up blood or [place] material that looks like coffee grounds; red spots on the skin; unusual bruising or bleeding from the eye, gums, or nose signs and symptoms of  a blood clot such as breathing problems; chest pain; severe, sudden headache; pain, swelling, warmth in the leg signs and symptoms of a stroke like changes in vision; confusion; trouble speaking or understanding; severe headaches; sudden numbness or weakness of the face, arm or leg; trouble walking; dizziness; loss of balance or coordination stomach pain sweating swelling of legs or ankles vomiting weight gain Side effects that usually do not require medical attention (report to your doctor or health care professional if they  continue or are bothersome): back pain changes in taste decreased appetite dry skin nausea tiredness This list may not describe all possible side effects. Call your doctor for medical advice about side effects. You may report side effects to FDA at 1-800-FDA-1088. Where should I keep my medicine? This drug is given in a hospital or clinic and will not be stored at home. NOTE: This sheet is a summary. It may not cover all possible information. If you have questions about this medicine, talk to your doctor, pharmacist, or health care provider.  2021 Elsevier/Gold Standard (2019-05-28 10:50:46)  Irinotecan injection What is this medicine? IRINOTECAN (ir in oh TEE kan ) is a chemotherapy drug. It is used to treat colon and rectal cancer. This medicine may be used for other purposes; ask your health care provider or pharmacist if you have questions. COMMON BRAND NAME(S): Camptosar What should I tell my health care provider before I take this medicine? They need to know if you have any of these conditions: dehydration diarrhea infection (especially a virus infection such as chickenpox, cold sores, or herpes) liver disease low blood counts, like low white cell, platelet, or red cell counts low levels of calcium, magnesium, or potassium in the blood recent or ongoing radiation therapy an unusual or allergic reaction to irinotecan, other medicines, foods, dyes, or preservatives pregnant or trying to get pregnant breast-feeding How should I use this medicine? This drug is given as an infusion into a vein. It is administered in a hospital or clinic by a specially trained health care professional. Talk to your pediatrician regarding the use of this medicine in children. Special care may be needed. Overdosage: If you think you have taken too much of this medicine contact a poison control center or emergency room at once. NOTE: This medicine is only for you. Do not share this medicine with  others. What if I miss a dose? It is important not to miss your dose. Call your doctor or health care professional if you are unable to keep an appointment. What may interact with this medicine? Do not take this medicine with any of the following medications: cobicistat itraconazole This medicine may interact with the following medications: antiviral medicines for HIV or AIDS certain antibiotics like rifampin or rifabutin certain medicines for fungal infections like ketoconazole, posaconazole, and voriconazole certain medicines for seizures like carbamazepine, phenobarbital, phenotoin clarithromycin gemfibrozil nefazodone St. John's Wort This list may not describe all possible interactions. Give your health care provider a list of all the medicines, herbs, non-prescription drugs, or dietary supplements you use. Also tell them if you smoke, drink alcohol, or use illegal drugs. Some items may interact with your medicine. What should I watch for while using this medicine? Your condition will be monitored carefully while you are receiving this medicine. You will need important blood work done while you are taking this medicine. This drug may make you feel generally unwell. This is not uncommon, as chemotherapy can affect healthy cells as well as cancer cells.  Report any side effects. Continue your course of treatment even though you feel ill unless your doctor tells you to stop. In some cases, you may be given additional medicines to help with side effects. Follow all directions for their use. You may get drowsy or dizzy. Do not drive, use machinery, or do anything that needs mental alertness until you know how this medicine affects you. Do not stand or sit up quickly, especially if you are an older patient. This reduces the risk of dizzy or fainting spells. Call your health care professional for advice if you get a fever, chills, or sore throat, or other symptoms of a cold or flu. Do not treat  yourself. This medicine decreases your body's ability to fight infections. Try to avoid being around people who are sick. Avoid taking products that contain aspirin, acetaminophen, ibuprofen, naproxen, or ketoprofen unless instructed by your doctor. These medicines may hide a fever. This medicine may increase your risk to bruise or bleed. Call your doctor or health care professional if you notice any unusual bleeding. Be careful brushing and flossing your teeth or using a toothpick because you may get an infection or bleed more easily. If you have any dental work done, tell your dentist you are receiving this medicine. Do not become pregnant while taking this medicine or for 6 months after stopping it. Women should inform their health care professional if they wish to become pregnant or think they might be pregnant. Men should not father a child while taking this medicine and for 3 months after stopping it. There is potential for serious side effects to an unborn child. Talk to your health care professional for more information. Do not breast-feed an infant while taking this medicine or for 7 days after stopping it. This medicine has caused ovarian failure in some women. This medicine may make it more difficult to get pregnant. Talk to your health care professional if you are concerned about your fertility. This medicine has caused decreased sperm counts in some men. This may make it more difficult to father a child. Talk to your health care professional if you are concerned about your fertility. What side effects may I notice from receiving this medicine? Side effects that you should report to your doctor or health care professional as soon as possible: allergic reactions like skin rash, itching or hives, swelling of the face, lips, or tongue chest pain diarrhea flushing, runny nose, sweating during infusion low blood counts - this medicine may decrease the number of white blood cells, red blood cells  and platelets. You may be at increased risk for infections and bleeding. nausea, vomiting pain, swelling, warmth in the leg signs of decreased platelets or bleeding - bruising, pinpoint red spots on the skin, black, tarry stools, blood in the urine signs of infection - fever or chills, cough, sore throat, pain or difficulty passing urine signs of decreased red blood cells - unusually weak or tired, fainting spells, lightheadedness Side effects that usually do not require medical attention (report to your doctor or health care professional if they continue or are bothersome): constipation hair loss headache loss of appetite mouth sores stomach pain This list may not describe all possible side effects. Call your doctor for medical advice about side effects. You may report side effects to FDA at 1-800-FDA-1088. Where should I keep my medicine? This drug is given in a hospital or clinic and will not be stored at home. NOTE: This sheet is a summary. It may  not cover all possible information. If you have questions about this medicine, talk to your doctor, pharmacist, or health care provider.  2021 Elsevier/Gold Standard (2019-07-01 17:46:13)  Leucovorin injection What is this medicine? LEUCOVORIN (loo koe VOR in) is used to prevent or treat the harmful effects of some medicines. This medicine is used to treat anemia caused by a low amount of folic acid in the body. It is also used with 5-fluorouracil (5-FU) to treat colon cancer. This medicine may be used for other purposes; ask your health care provider or pharmacist if you have questions. What should I tell my health care provider before I take this medicine? They need to know if you have any of these conditions: anemia from low levels of vitamin B-12 in the blood an unusual or allergic reaction to leucovorin, folic acid, other medicines, foods, dyes, or preservatives pregnant or trying to get pregnant breast-feeding How should I use this  medicine? This medicine is for injection into a muscle or into a vein. It is given by a health care professional in a hospital or clinic setting. Talk to your pediatrician regarding the use of this medicine in children. Special care may be needed. Overdosage: If you think you have taken too much of this medicine contact a poison control center or emergency room at once. NOTE: This medicine is only for you. Do not share this medicine with others. What if I miss a dose? This does not apply. What may interact with this medicine? capecitabine fluorouracil phenobarbital phenytoin primidone trimethoprim-sulfamethoxazole This list may not describe all possible interactions. Give your health care provider a list of all the medicines, herbs, non-prescription drugs, or dietary supplements you use. Also tell them if you smoke, drink alcohol, or use illegal drugs. Some items may interact with your medicine. What should I watch for while using this medicine? Your condition will be monitored carefully while you are receiving this medicine. This medicine may increase the side effects of 5-fluorouracil, 5-FU. Tell your doctor or health care professional if you have diarrhea or mouth sores that do not get better or that get worse. What side effects may I notice from receiving this medicine? Side effects that you should report to your doctor or health care professional as soon as possible: allergic reactions like skin rash, itching or hives, swelling of the face, lips, or tongue breathing problems fever, infection mouth sores unusual bleeding or bruising unusually weak or tired Side effects that usually do not require medical attention (report to your doctor or health care professional if they continue or are bothersome): constipation or diarrhea loss of appetite nausea, vomiting This list may not describe all possible side effects. Call your doctor for medical advice about side effects. You may report  side effects to FDA at 1-800-FDA-1088. Where should I keep my medicine? This drug is given in a hospital or clinic and will not be stored at home. NOTE: This sheet is a summary. It may not cover all possible information. If you have questions about this medicine, talk to your doctor, pharmacist, or health care provider.  2021 Elsevier/Gold Standard (2008-02-04 16:50:29)  Fluorouracil, 5-FU injection What is this medicine? FLUOROURACIL, 5-FU (flure oh YOOR a sil) is a chemotherapy drug. It slows the growth of cancer cells. This medicine is used to treat many types of cancer like breast cancer, colon or rectal cancer, pancreatic cancer, and stomach cancer. This medicine may be used for other purposes; ask your health care provider or pharmacist if you  have questions. COMMON BRAND NAME(S): Adrucil What should I tell my health care provider before I take this medicine? They need to know if you have any of these conditions: blood disorders dihydropyrimidine dehydrogenase (DPD) deficiency infection (especially a virus infection such as chickenpox, cold sores, or herpes) kidney disease liver disease malnourished, poor nutrition recent or ongoing radiation therapy an unusual or allergic reaction to fluorouracil, other chemotherapy, other medicines, foods, dyes, or preservatives pregnant or trying to get pregnant breast-feeding How should I use this medicine? This drug is given as an infusion or injection into a vein. It is administered in a hospital or clinic by a specially trained health care professional. Talk to your pediatrician regarding the use of this medicine in children. Special care may be needed. Overdosage: If you think you have taken too much of this medicine contact a poison control center or emergency room at once. NOTE: This medicine is only for you. Do not share this medicine with others. What if I miss a dose? It is important not to miss your dose. Call your doctor or health  care professional if you are unable to keep an appointment. What may interact with this medicine? Do not take this medicine with any of the following medications: live virus vaccines This medicine may also interact with the following medications: medicines that treat or prevent blood clots like warfarin, enoxaparin, and dalteparin This list may not describe all possible interactions. Give your health care provider a list of all the medicines, herbs, non-prescription drugs, or dietary supplements you use. Also tell them if you smoke, drink alcohol, or use illegal drugs. Some items may interact with your medicine. What should I watch for while using this medicine? Visit your doctor for checks on your progress. This drug may make you feel generally unwell. This is not uncommon, as chemotherapy can affect healthy cells as well as cancer cells. Report any side effects. Continue your course of treatment even though you feel ill unless your doctor tells you to stop. In some cases, you may be given additional medicines to help with side effects. Follow all directions for their use. Call your doctor or health care professional for advice if you get a fever, chills or sore throat, or other symptoms of a cold or flu. Do not treat yourself. This drug decreases your body's ability to fight infections. Try to avoid being around people who are sick. This medicine may increase your risk to bruise or bleed. Call your doctor or health care professional if you notice any unusual bleeding. Be careful brushing and flossing your teeth or using a toothpick because you may get an infection or bleed more easily. If you have any dental work done, tell your dentist you are receiving this medicine. Avoid taking products that contain aspirin, acetaminophen, ibuprofen, naproxen, or ketoprofen unless instructed by your doctor. These medicines may hide a fever. Do not become pregnant while taking this medicine. Women should inform  their doctor if they wish to become pregnant or think they might be pregnant. There is a potential for serious side effects to an unborn child. Talk to your health care professional or pharmacist for more information. Do not breast-feed an infant while taking this medicine. Men should inform their doctor if they wish to father a child. This medicine may lower sperm counts. Do not treat diarrhea with over the counter products. Contact your doctor if you have diarrhea that lasts more than 2 days or if it is severe and watery.  This medicine can make you more sensitive to the sun. Keep out of the sun. If you cannot avoid being in the sun, wear protective clothing and use sunscreen. Do not use sun lamps or tanning beds/booths. What side effects may I notice from receiving this medicine? Side effects that you should report to your doctor or health care professional as soon as possible: allergic reactions like skin rash, itching or hives, swelling of the face, lips, or tongue low blood counts - this medicine may decrease the number of white blood cells, red blood cells and platelets. You may be at increased risk for infections and bleeding. signs of infection - fever or chills, cough, sore throat, pain or difficulty passing urine signs of decreased platelets or bleeding - bruising, pinpoint red spots on the skin, black, tarry stools, blood in the urine signs of decreased red blood cells - unusually weak or tired, fainting spells, lightheadedness breathing problems changes in vision chest pain mouth sores nausea and vomiting pain, swelling, redness at site where injected pain, tingling, numbness in the hands or feet redness, swelling, or sores on hands or feet stomach pain unusual bleeding Side effects that usually do not require medical attention (report to your doctor or health care professional if they continue or are bothersome): changes in finger or toe nails diarrhea dry or itchy skin hair  loss headache loss of appetite sensitivity of eyes to the light stomach upset unusually teary eyes This list may not describe all possible side effects. Call your doctor for medical advice about side effects. You may report side effects to FDA at 1-800-FDA-1088. Where should I keep my medicine? This drug is given in a hospital or clinic and will not be stored at home. NOTE: This sheet is a summary. It may not cover all possible information. If you have questions about this medicine, talk to your doctor, pharmacist, or health care provider.  2021 Elsevier/Gold Standard (2019-07-01 15:00:03)

## 2021-01-20 NOTE — Progress Notes (Signed)
[facility] OFFICE PROGRESS NOTE   Diagnosis:  Colon cancer  INTERVAL HISTORY:   [name] returns as scheduled. She completed another cycle of FOLFIRI/Avastin.  She feels well.  No nausea or vomiting.  No mouth sores.  No diarrhea.  She denies bleeding.  No abdominal pain.  No leg swelling or calf pain.  No fever, cough, shortness of breath.  Objective:  Vital signs in last 24 hours:  Blood pressure (!) 124/54, pulse 68, temperature 97.8 F (36.6 C), temperature source Oral, resp. rate 18, height $RemoveBe'5\' 7"'qPlqDbEUq$  (1.702 m), weight 124 lb 12.8 oz (56.6 kg), SpO2 100 %.    HEENT: No thrush or ulcers. Resp: Lungs clear bilaterally. Cardio: Regular rate and rhythm. GI: Abdomen soft and nontender.  No hepatomegaly. Vascular: No leg edema. Skin: Palms without erythema. Port-A-Cath without erythema.   Lab Results:  Lab Results  Component Value Date   WBC 6.4 01/20/2021   HGB 9.3 (L) 01/20/2021   HCT 30.0 (L) 01/20/2021   MCV 104.2 (H) 01/20/2021   PLT 139 (L) 01/20/2021   NEUTROABS 4.2 01/20/2021    Imaging:  No results found.  Medications: I have reviewed the patient's current medications.  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  Cirrhosis.  Innumerable liver lesions. Colonoscopy 12/18/2019-ulcerated partially obstructing large mass in the mid ascending colon.  Biopsy invasive adenocarcinoma; preserved expression major MMR proteins Upper endoscopy 12/18/2019-normal esophagus, stomach, examined duodenum CEA 12/19/2019-8,923 Biopsy liver lesion 12/23/2019-adenocarcinoma Foundation 1-K-ras wild-type; tumor mutation burden greater than or equal to 10; microsatellite stable; BRAF V600E Cycle 1 FOLFOX 01/01/2020 Cycle 2 FOLFOX 01/15/2020  Cycle 3 FOLFOX 01/29/2020, Udenyca added Cycle 4 FOLFOX 02/12/2020, Udenyca held Cycle 5 FOLFOX 02/26/2020,  Udenyca Cycle 6 FOLFOX 03/11/2020, Udenyca held CT abdomen/pelvis 03/16/2020-stable diffuse liver lesions, stable strictured appearing ascending colon, possible pericolonic implant, vague left lower lobe nodule Cycle 1 FOLFIRI/Avastin 04/06/2020 Cycle 2 FOLFIRI/Avastin 04/21/2020 Cycle 3 FOLFIRI/Avastin 05/06/2020 Cycle 4 FOLFIRI/Avastin 05/20/2020 Cycle 5 FOLFIRI/Avastin 06/03/2020 CTs 06/15/2020-mild to moderate improvement in hepatic metastasis.  No new or progressive disease.  Narrowing within the proximal ascending colon with upstream mild cecal and terminal ileum dilatation, similar. Cycle 6 FOLFIRI/Avastin 06/17/2020 Cycle 7 FOLFIRI/Avastin 07/01/2020 Cycle 8 FOLFIRI/Avastin 07/15/2020 Cycle 9 FOLFIRI/Avastin 07/29/2020-5-FU bolus eliminated, 5-FU infusion and irinotecan dose reduced Cycle 10 FOLFIRI/Avastin 08/12/2020 CTs 08/23/2020- mild residual wall thickening involving the cecum/ascending colon.  Numerous liver metastases improved. Cycle 11 FOLFIRI/Avastin 08/25/2020 Cycle 12 FOLFIRI/Avastin 09/16/2020 Cycle 13 FOLFIRI/Avastin 10/07/2020 Cycle 14 FOLFIRI/Avastin 10/28/2020 Cycle 15 FOLFIRI/Avastin 11/18/2020 CT abdomen/pelvis 12/06/2020-mild improvement in multifocal hepatic metastases, wall thickening at the ascending colon with pericolonic inflammatory changes Cycle 16 FOLFIRI/Avastin 12/09/2020 Cycle 17 FOLFIRI/Avastin 12/30/2020 Cycle 18 FOLFIRI/Avastin 01/20/2021 Anemia secondary to GI blood loss, on oral iron BID  Cirrhosis 12/26/2019-hospital admission for right lower extremity DVT and GI bleed, treated with heparin anticoagulation beginning 12/26/2019, converted to Lovenox at discharge 12/29/2019 History of low-grade fever-likely "tumor" fever COVID-19 infection January 2021    Disposition: Stacy Burns appears well.  She has completed 17 cycles of FOLFIRI/Avastin.  There is no clinical evidence of disease progression.  Plan to proceed with cycle 18 today as scheduled.  We reviewed the CBC  from today.  Counts adequate to proceed as above.  She will return for lab, follow-up, FOLFIRI/Avastin in 3 weeks.  She will contact the office in the interim with any problems.    Lattie Haw  Aesha Agrawal ANP/GNP-BC   01/20/2021  8:51 AM

## 2021-01-20 NOTE — Progress Notes (Signed)
Patient stated she "had a big breakfast (cranberry juice, eggs, toast, and bacon)" prior to coming to the cancer center.  Patient provided juice and snacks while in treatment area. Patient denied feeling weak or shaky.

## 2021-01-22 ENCOUNTER — Other Ambulatory Visit: Payer: Self-pay

## 2021-01-22 ENCOUNTER — Inpatient Hospital Stay: Payer: BC Managed Care – PPO

## 2021-01-22 VITALS — BP 122/52 | HR 75 | Temp 97.4°F | Resp 18

## 2021-01-22 DIAGNOSIS — C182 Malignant neoplasm of ascending colon: Secondary | ICD-10-CM

## 2021-01-22 DIAGNOSIS — Z5112 Encounter for antineoplastic immunotherapy: Secondary | ICD-10-CM | POA: Diagnosis not present

## 2021-01-22 MED ORDER — HEPARIN SOD (PORK) LOCK FLUSH 100 UNIT/ML IV SOLN
500.0000 [IU] | Freq: Once | INTRAVENOUS | Status: AC | PRN
  Administered 2021-01-22: 500 [IU]
  Filled 2021-01-22: qty 5

## 2021-01-22 MED ORDER — SODIUM CHLORIDE 0.9% FLUSH
10.0000 mL | INTRAVENOUS | Status: DC | PRN
  Administered 2021-01-22: 10 mL
  Filled 2021-01-22: qty 10

## 2021-01-22 MED ORDER — PEGFILGRASTIM-CBQV 6 MG/0.6ML ~~LOC~~ SOSY
6.0000 mg | PREFILLED_SYRINGE | Freq: Once | SUBCUTANEOUS | Status: AC
  Administered 2021-01-22: 6 mg via SUBCUTANEOUS

## 2021-02-02 ENCOUNTER — Other Ambulatory Visit: Payer: Self-pay | Admitting: Oncology

## 2021-02-02 DIAGNOSIS — C182 Malignant neoplasm of ascending colon: Secondary | ICD-10-CM

## 2021-02-05 ENCOUNTER — Other Ambulatory Visit: Payer: Self-pay | Admitting: Oncology

## 2021-02-10 ENCOUNTER — Telehealth: Payer: Self-pay

## 2021-02-10 ENCOUNTER — Other Ambulatory Visit: Payer: Self-pay

## 2021-02-10 ENCOUNTER — Inpatient Hospital Stay: Payer: BC Managed Care – PPO

## 2021-02-10 ENCOUNTER — Encounter: Payer: Self-pay | Admitting: *Deleted

## 2021-02-10 ENCOUNTER — Inpatient Hospital Stay (HOSPITAL_BASED_OUTPATIENT_CLINIC_OR_DEPARTMENT_OTHER): Payer: BC Managed Care – PPO | Admitting: Oncology

## 2021-02-10 VITALS — BP 118/58 | HR 64 | Temp 97.8°F | Resp 19 | Ht 67.0 in | Wt 127.6 lb

## 2021-02-10 DIAGNOSIS — C182 Malignant neoplasm of ascending colon: Secondary | ICD-10-CM

## 2021-02-10 DIAGNOSIS — Z5112 Encounter for antineoplastic immunotherapy: Secondary | ICD-10-CM | POA: Diagnosis not present

## 2021-02-10 LAB — CBC WITH DIFFERENTIAL (CANCER CENTER ONLY)
Abs Immature Granulocytes: 0.07 10*3/uL (ref 0.00–0.07)
Basophils Absolute: 0.1 10*3/uL (ref 0.0–0.1)
Basophils Relative: 1 %
Eosinophils Absolute: 0.2 10*3/uL (ref 0.0–0.5)
Eosinophils Relative: 2 %
HCT: 28.8 % — ABNORMAL LOW (ref 36.0–46.0)
Hemoglobin: 8.9 g/dL — ABNORMAL LOW (ref 12.0–15.0)
Immature Granulocytes: 1 %
Lymphocytes Relative: 16 %
Lymphs Abs: 1.1 10*3/uL (ref 0.7–4.0)
MCH: 31.8 pg (ref 26.0–34.0)
MCHC: 30.9 g/dL (ref 30.0–36.0)
MCV: 102.9 fL — ABNORMAL HIGH (ref 80.0–100.0)
Monocytes Absolute: 0.7 10*3/uL (ref 0.1–1.0)
Monocytes Relative: 10 %
Neutro Abs: 4.9 10*3/uL (ref 1.7–7.7)
Neutrophils Relative %: 70 %
Platelet Count: 136 10*3/uL — ABNORMAL LOW (ref 150–400)
RBC: 2.8 MIL/uL — ABNORMAL LOW (ref 3.87–5.11)
RDW: 16.2 % — ABNORMAL HIGH (ref 11.5–15.5)
WBC Count: 7 10*3/uL (ref 4.0–10.5)
nRBC: 0 % (ref 0.0–0.2)

## 2021-02-10 LAB — CMP (CANCER CENTER ONLY)
ALT: 40 U/L (ref 0–44)
AST: 45 U/L — ABNORMAL HIGH (ref 15–41)
Albumin: 3.4 g/dL — ABNORMAL LOW (ref 3.5–5.0)
Alkaline Phosphatase: 203 U/L — ABNORMAL HIGH (ref 38–126)
Anion gap: 7 (ref 5–15)
BUN: 19 mg/dL (ref 8–23)
CO2: 25 mmol/L (ref 22–32)
Calcium: 8.8 mg/dL — ABNORMAL LOW (ref 8.9–10.3)
Chloride: 109 mmol/L (ref 98–111)
Creatinine: 0.69 mg/dL (ref 0.44–1.00)
GFR, Estimated: >60 mL/min (ref >60)
Glucose, Bld: 77 mg/dL (ref 70–99)
Potassium: 4.4 mmol/L (ref 3.5–5.1)
Sodium: 141 mmol/L (ref 135–145)
Total Bilirubin: 0.3 mg/dL (ref 0.3–1.2)
Total Protein: 5.9 g/dL — ABNORMAL LOW (ref 6.5–8.1)

## 2021-02-10 LAB — CEA (ACCESS): CEA (CHCC): 31.21 ng/mL — ABNORMAL HIGH (ref 0.00–5.00)

## 2021-02-10 MED ORDER — SODIUM CHLORIDE 0.9 % IV SOLN
140.0000 mg/m2 | Freq: Once | INTRAVENOUS | Status: AC
  Administered 2021-02-10: 220 mg via INTRAVENOUS
  Filled 2021-02-10: qty 11

## 2021-02-10 MED ORDER — SODIUM CHLORIDE 0.9 % IV SOLN
2500.0000 mg | INTRAVENOUS | Status: DC
  Administered 2021-02-10: 2500 mg via INTRAVENOUS
  Filled 2021-02-10: qty 50

## 2021-02-10 MED ORDER — SODIUM CHLORIDE 0.9% FLUSH
10.0000 mL | INTRAVENOUS | Status: DC | PRN
  Administered 2021-02-10: 10 mL
  Filled 2021-02-10: qty 10

## 2021-02-10 MED ORDER — LEUCOVORIN CALCIUM INJECTION 350 MG
200.0000 mg/m2 | Freq: Once | INTRAMUSCULAR | Status: AC
  Administered 2021-02-10: 318 mg via INTRAVENOUS
  Filled 2021-02-10: qty 15.9

## 2021-02-10 MED ORDER — ENOXAPARIN SODIUM 40 MG/0.4ML IJ SOSY: 40.0000 mg | PREFILLED_SYRINGE | INTRAMUSCULAR | 2 refills | Status: DC

## 2021-02-10 MED ORDER — PALONOSETRON HCL INJECTION 0.25 MG/5ML
0.2500 mg | Freq: Once | INTRAVENOUS | Status: AC
Start: 2021-02-10 — End: 2021-02-10
  Administered 2021-02-10: 0.25 mg via INTRAVENOUS
  Filled 2021-02-10: qty 5

## 2021-02-10 MED ORDER — SODIUM CHLORIDE 0.9 % IV SOLN
10.0000 mg | Freq: Once | INTRAVENOUS | Status: AC
  Administered 2021-02-10: 10 mg via INTRAVENOUS
  Filled 2021-02-10: qty 1

## 2021-02-10 MED ORDER — SODIUM CHLORIDE 0.9 % IV SOLN
Freq: Once | INTRAVENOUS | Status: AC
  Filled 2021-02-10: qty 250

## 2021-02-10 MED ORDER — ATROPINE SULFATE 1 MG/ML IJ SOLN
0.5000 mg | Freq: Once | INTRAMUSCULAR | Status: AC | PRN
  Administered 2021-02-10: 0.5 mg via INTRAVENOUS
  Filled 2021-02-10: qty 1

## 2021-02-10 MED ORDER — SODIUM CHLORIDE 0.9 % IV SOLN
5.0000 mg/kg | Freq: Once | INTRAVENOUS | Status: AC
  Administered 2021-02-10: 275 mg via INTRAVENOUS
  Filled 2021-02-10: qty 11

## 2021-02-10 NOTE — Progress Notes (Signed)
[place] OFFICE PROGRESS NOTE   Diagnosis: Colon cancer  INTERVAL HISTORY:   Stacy Burns completed another cycle of FOLFIRI/Avastin on 01/20/2021.  She did not have significant nausea or diarrhea following chemotherapy.  She has developed "knots "over the legs.  She is not aware of trauma.  She has intermittent nosebleeding.  She recently had prolonged mild nosebleeding.  Objective:  Vital signs in last 24 hours:  Blood pressure (!) 118/58, pulse 64, temperature 97.8 F (36.6 C), temperature source Oral, resp. rate 19, height _0  (1.702 m), weight 127 lb 9.6 oz (57.9 kg), SpO2 100 %.    HEENT: No thrush or ulcers Resp: Lungs clear bilaterally Cardio: Regular rate and rhythm GI: Nontender, no hepatosplenomegaly Vascular: No leg edema  Skin: Several ecchymoses over the right and left thigh with associated hematomas.  These areas appear to be resolving.  Purpuric areas at the dorsum of the forearms  Portacath/PICC-without erythema  Lab Results:  Lab Results  Component Value Date   WBC 7.0 02/10/2021   HGB 8.9 (L) 02/10/2021   HCT 28.8 (L) 02/10/2021   MCV 102.9 (H) 02/10/2021   PLT 136 (L) 02/10/2021   NEUTROABS 4.9 02/10/2021    CMP  Lab Results  Component Value Date   NA 141 02/10/2021   K 4.4 02/10/2021   CL 109 02/10/2021   CO2 25 02/10/2021   GLUCOSE 77 02/10/2021   BUN 19 02/10/2021   CREATININE 0.69 02/10/2021   CALCIUM 8.8 (L) 02/10/2021   PROT 5.9 (L) 02/10/2021   ALBUMIN 3.4 (L) 02/10/2021   AST 45 (H) 02/10/2021   ALT 40 02/10/2021   ALKPHOS 203 (H) 02/10/2021   BILITOT 0.3 02/10/2021   GFRNONAA >60 02/10/2021   GFRAA >60 05/06/2020    Lab Results  Component Value Date   CEA1 54.57 (H) 12/30/2020     Medications: I have reviewed the patient's current medications.   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  Cirrhosis.  Innumerable liver lesions. Colonoscopy 12/18/2019-ulcerated partially obstructing large mass in the mid ascending colon.  Biopsy invasive adenocarcinoma; preserved expression major MMR proteins Upper endoscopy 12/18/2019-normal esophagus, stomach, examined duodenum CEA 12/19/2019-8,923 Biopsy liver lesion 12/23/2019-adenocarcinoma Foundation 1-K-ras wild-type; tumor mutation burden greater than or equal to 10; microsatellite stable; BRAF V600E Cycle 1 FOLFOX 01/01/2020 Cycle 2 FOLFOX 01/15/2020  Cycle 3 FOLFOX 01/29/2020, Udenyca added Cycle 4 FOLFOX 02/12/2020, Udenyca held Cycle 5 FOLFOX 02/26/2020, Udenyca Cycle 6 FOLFOX 03/11/2020, Udenyca held CT abdomen/pelvis 03/16/2020-stable diffuse liver lesions, stable strictured appearing ascending colon, possible pericolonic implant, vague left lower lobe nodule Cycle 1 FOLFIRI/Avastin 04/06/2020 Cycle 2 FOLFIRI/Avastin 04/21/2020 Cycle 3 FOLFIRI/Avastin 05/06/2020 Cycle 4 FOLFIRI/Avastin 05/20/2020 Cycle 5 FOLFIRI/Avastin 06/03/2020 CTs 06/15/2020-mild to moderate improvement in hepatic metastasis.  No new or progressive disease.  Narrowing within the proximal ascending colon with upstream mild cecal and terminal ileum dilatation, similar. Cycle 6 FOLFIRI/Avastin 06/17/2020 Cycle 7 FOLFIRI/Avastin 07/01/2020 Cycle 8 FOLFIRI/Avastin 07/15/2020 Cycle 9 FOLFIRI/Avastin 07/29/2020-5-FU bolus eliminated, 5-FU infusion and irinotecan dose reduced Cycle 10 FOLFIRI/Avastin 08/12/2020 CTs 08/23/2020- mild residual wall thickening involving the cecum/ascending colon.  Numerous liver metastases improved. Cycle 11 FOLFIRI/Avastin 08/25/2020 Cycle 12 FOLFIRI/Avastin 09/16/2020 Cycle 13 FOLFIRI/Avastin 10/07/2020 Cycle 14 FOLFIRI/Avastin 10/28/2020 Cycle 15 FOLFIRI/Avastin 11/18/2020 CT abdomen/pelvis 12/06/2020-mild improvement in multifocal hepatic metastases, wall thickening at the ascending colon with pericolonic inflammatory changes Cycle 16 FOLFIRI/Avastin  12/09/2020 Cycle 17 FOLFIRI/Avastin 12/30/2020  Cycle 18 FOLFIRI/Avastin 01/20/2021 Cycle 19 FOLFIRI/Avastin 02/10/2021 Anemia secondary to GI blood loss, on oral iron BID  Cirrhosis 12/26/2019-hospital admission for right lower extremity DVT and GI bleed, treated with heparin anticoagulation beginning 12/26/2019, converted to Lovenox at discharge 12/29/2019 Lovenox reduced to 40 mg daily 02/10/2021 secondary to bruising and nosebleeding History of low-grade fever-likely "tumor" fever COVID-19 infection January 2021      Disposition: Stacy Burns continues treatment with FOLFIRI/Avastin.  She is tolerating chemotherapy well.  There is no clinical evidence of disease progression.  She will complete another cycle of FOLFIRI/Avastin today.  She has developed ecchymoses with associated hematomas over the thighs.  She reports a recent increase in nose bleeding.  The bleeding is likely secondary to Lovenox, complicated by Avastin and thrombocytopenia.  The Lovenox will be dose reduced to 40 mg daily.  She will call for new bleeding.  Stacy Burns will return for an office visit and chemotherapy in 3 weeks.  [name], MD  02/10/2021  5:38 PM

## 2021-02-10 NOTE — Patient Instructions (Signed)
Implanted Port Home Guide An implanted port is a device that is placed under the skin. It is usually placed in the chest. The device can be used to give IV medicine, to take blood, or for dialysis. You may have an implanted port if: You need IV medicine that would be irritating to the small veins in your hands or arms. You need IV medicines, such as antibiotics, for a long period of time. You need IV nutrition for a long period of time. You need dialysis. When you have a port, your health care provider can choose to use the port instead of veins in your arms for these procedures. You may have fewer limitations when using a port than you would if you used other types of long-term IVs, and you will likely be able to return to normal activities afteryour incision heals. An implanted port has two main parts: Reservoir. The reservoir is the part where a needle is inserted to give medicines or draw blood. The reservoir is round. After it is placed, it appears as a small, raised area under your skin. Catheter. The catheter is a thin, flexible tube that connects the reservoir to a vein. Medicine that is inserted into the reservoir goes into the catheter and then into the vein. How is my port accessed? To access your port: A numbing cream may be placed on the skin over the port site. Your health care provider will put on a mask and sterile gloves. The skin over your port will be cleaned carefully with a germ-killing soap and allowed to dry. Your health care provider will gently pinch the port and insert a needle into it. Your health care provider will check for a blood return to make sure the port is in the vein and is not clogged. If your port needs to remain accessed to get medicine continuously (constant infusion), your health care provider will place a clear bandage (dressing) over the needle site. The dressing and needle will need to be changed every week, or as told by your health care provider. What  is flushing? Flushing helps keep the port from getting clogged. Follow instructions from your health care provider about how and when to flush the port. Ports are usually flushed with saline solution or a medicine called heparin. The need for flushing will depend on how the port is used: If the port is only used from time to time to give medicines or draw blood, the port may need to be flushed: Before and after medicines have been given. Before and after blood has been drawn. As part of routine maintenance. Flushing may be recommended every 4-6 weeks. If a constant infusion is running, the port may not need to be flushed. Throw away any syringes in a disposal container that is meant for sharp items (sharps container). You can buy a sharps container from a pharmacy, or you can make one by using an empty hard plastic bottle with a cover. How long will my port stay implanted? The port can stay in for as long as your health care provider thinks it is needed. When it is time for the port to come out, a surgery will be done to remove it. The surgery will be similar to the procedure that was done to putthe port in. Follow these instructions at home:  Flush your port as told by your health care provider. If you need an infusion over several days, follow instructions from your health care provider about how to take   care of your port site. Make sure you: Wash your hands with soap and water before you change your dressing. If soap and water are not available, use alcohol-based hand sanitizer. Change your dressing as told by your health care provider. Place any used dressings or infusion bags into a plastic bag. Throw that bag in the trash. Keep the dressing that covers the needle clean and dry. Do not get it wet. Do not use scissors or sharp objects near the tube. Keep the tube clamped, unless it is being used. Check your port site every day for signs of infection. Check for: Redness, swelling, or  pain. Fluid or blood. Pus or a bad smell. Protect the skin around the port site. Avoid wearing bra straps that rub or irritate the site. Protect the skin around your port from seat belts. Place a soft pad over your chest if needed. Bathe or shower as told by your health care provider. The site may get wet as long as you are not actively receiving an infusion. Return to your normal activities as told by your health care provider. Ask your health care provider what activities are safe for you. Carry a medical alert card or wear a medical alert bracelet at all times. This will let health care providers know that you have an implanted port in case of an emergency. Get help right away if: You have redness, swelling, or pain at the port site. You have fluid or blood coming from your port site. You have pus or a bad smell coming from the port site. You have a fever. Summary Implanted ports are usually placed in the chest for long-term IV access. Follow instructions from your health care provider about flushing the port and changing bandages (dressings). Take care of the area around your port by avoiding clothing that puts pressure on the area, and by watching for signs of infection. Protect the skin around your port from seat belts. Place a soft pad over your chest if needed. Get help right away if you have a fever or you have redness, swelling, pain, drainage, or a bad smell at the port site. This information is not intended to replace advice given to you by your health care provider. Make sure you discuss any questions you have with your healthcare provider. Document Revised: 12/15/2019 Document Reviewed: 12/15/2019 Elsevier Patient Education  2022 [employer]  

## 2021-02-10 NOTE — Telephone Encounter (Signed)
Per Dr. Benay Spice lovenox dose changed today to 40 mg prescription sent in to pharmacy.

## 2021-02-10 NOTE — Progress Notes (Signed)
MD completed form and signed to return to work on 02/21/2021 w/restrictions to work at home. Copy to HIM to scan.

## 2021-02-10 NOTE — Patient Instructions (Signed)
[place]    Discharge Instructions:  Thank you for choosing [facility] to provide your oncology and hematology care.   If you have a lab appointment with the [facility], please go directly to the [facility] and check in at the registration area.   Wear comfortable clothing and clothing appropriate for easy access to any Portacath or PICC line.   We strive to give you quality time with your provider. You may need to reschedule your appointment if you arrive late (15 or more minutes).  Arriving late affects you and other patients whose appointments are after yours.  Also, if you miss three or more appointments without notifying the office, you may be dismissed from the clinic at the provider's discretion.      For prescription refill requests, have your pharmacy contact our office and allow 72 hours for refills to be completed.    Today you received the following chemotherapy and/or immunotherapy agents Bevacizumab-bvzr (ZIRABEV), Irinotecan (CAMPTOSAR), Leucovorin & Flourouracil (ADRUCIL).   To help prevent nausea and vomiting after your treatment, we encourage you to take your nausea medication as directed.  BELOW ARE SYMPTOMS THAT SHOULD BE REPORTED IMMEDIATELY: *FEVER GREATER THAN 100.4 F (38 C) OR HIGHER *CHILLS OR SWEATING *NAUSEA AND VOMITING THAT IS NOT CONTROLLED WITH YOUR NAUSEA MEDICATION *UNUSUAL SHORTNESS OF BREATH *UNUSUAL BRUISING OR BLEEDING *URINARY PROBLEMS (pain or burning when urinating, or frequent urination) *BOWEL PROBLEMS (unusual diarrhea, constipation, pain near the anus) TENDERNESS IN MOUTH AND THROAT WITH OR WITHOUT PRESENCE OF ULCERS (sore throat, sores in mouth, or a toothache) UNUSUAL RASH, SWELLING OR PAIN  UNUSUAL VAGINAL DISCHARGE OR ITCHING   Items with * indicate a potential emergency and should be followed up as soon as possible or go to the Emergency Department if any problems should  occur.  Please show the CHEMOTHERAPY ALERT CARD or IMMUNOTHERAPY ALERT CARD at check-in to the Emergency Department and triage nurse.  Should you have questions after your visit or need to cancel or reschedule your appointment, please contact [facility]  Dept: [phone]  and follow the prompts.  Office hours are 8:00 a.m. to 4:30 p.m. Monday - Friday. Please note that voicemails left after 4:00 p.m. may not be returned until the following business day.  We are closed weekends and major holidays. You have access to a nurse at all times for urgent questions. Please call the main number to the clinic Dept: [phone] and follow the prompts.   For any non-urgent questions, you may also contact your provider using MyChart. We now offer e-Visits for anyone 14 and older to request care online for non-urgent symptoms. For details visit [email or web address].   Also download the MyChart app! Go to the app store, search "MyChart", open the app, select [facility], and log in with your MyChart username and password.  Due to Covid, a mask is required upon entering the hospital/clinic. If you do not have a mask, one will be given to you upon arrival. For doctor visits, patients may have 1 support person aged 34 or older with them. For treatment visits, patients cannot have anyone with them due to current Covid guidelines and our immunocompromised population.   Bevacizumab injection What is this medicine? BEVACIZUMAB (be va SIZ yoo mab) is a monoclonal antibody. It is used to treat many types of cancer. This medicine may be used for other purposes; ask your health care provider or pharmacist if you  have questions. COMMON BRAND NAME(S): Avastin, MVASI, Zirabev What should I tell my health care provider before I take this medicine? They need to know if you have any of these conditions: diabetes heart disease high blood pressure history of coughing up blood prior  anthracycline chemotherapy (e.g., doxorubicin, daunorubicin, epirubicin) recent or ongoing radiation therapy recent or planning to have surgery stroke an unusual or allergic reaction to bevacizumab, hamster proteins, mouse proteins, other medicines, foods, dyes, or preservatives pregnant or trying to get pregnant breast-feeding How should I use this medicine? This medicine is for infusion into a vein. It is given by a health care professional in a hospital or clinic setting. Talk to your pediatrician regarding the use of this medicine in children. Special care may be needed. Overdosage: If you think you have taken too much of this medicine contact a poison control center or emergency room at once. NOTE: This medicine is only for you. Do not share this medicine with others. What if I miss a dose? It is important not to miss your dose. Call your doctor or health care professional if you are unable to keep an appointment. What may interact with this medicine? Interactions are not expected. This list may not describe all possible interactions. Give your health care provider a list of all the medicines, herbs, non-prescription drugs, or dietary supplements you use. Also tell them if you smoke, drink alcohol, or use illegal drugs. Some items may interact with your medicine. What should I watch for while using this medicine? Your condition will be monitored carefully while you are receiving this medicine. You will need important blood work and urine testing done while you are taking this medicine. This medicine may increase your risk to bruise or bleed. Call your doctor or health care professional if you notice any unusual bleeding. Before having surgery, talk to your health care provider to make sure it is ok. This drug can increase the risk of poor healing of your surgical site or wound. You will need to stop this drug for 28 days before surgery. After surgery, wait at least 28 days before restarting  this drug. Make sure the surgical site or wound is healed enough before restarting this drug. Talk to your health care provider if questions. Do not become pregnant while taking this medicine or for 6 months after stopping it. Women should inform their doctor if they wish to become pregnant or think they might be pregnant. There is a potential for serious side effects to an unborn child. Talk to your health care professional or pharmacist for more information. Do not breast-feed an infant while taking this medicine and for 6 months after the last dose. This medicine has caused ovarian failure in some women. This medicine may interfere with the ability to have a child. You should talk to your doctor or health care professional if you are concerned about your fertility. What side effects may I notice from receiving this medicine? Side effects that you should report to your doctor or health care professional as soon as possible: allergic reactions like skin rash, itching or hives, swelling of the face, lips, or tongue chest pain or chest tightness chills coughing up blood high fever seizures severe constipation signs and symptoms of bleeding such as bloody or black, tarry stools; red or dark-Soberanes urine; spitting up blood or Drozdowski material that looks like coffee grounds; red spots on the skin; unusual bruising or bleeding from the eye, gums, or nose signs and symptoms of  a blood clot such as breathing problems; chest pain; severe, sudden headache; pain, swelling, warmth in the leg signs and symptoms of a stroke like changes in vision; confusion; trouble speaking or understanding; severe headaches; sudden numbness or weakness of the face, arm or leg; trouble walking; dizziness; loss of balance or coordination stomach pain sweating swelling of legs or ankles vomiting weight gain Side effects that usually do not require medical attention (report to your doctor or health care professional if they  continue or are bothersome): back pain changes in taste decreased appetite dry skin nausea tiredness This list may not describe all possible side effects. Call your doctor for medical advice about side effects. You may report side effects to FDA at 1-800-FDA-1088. Where should I keep my medicine? This drug is given in a hospital or clinic and will not be stored at home. NOTE: This sheet is a summary. It may not cover all possible information. If you have questions about this medicine, talk to your doctor, pharmacist, or health care provider.  2021 Elsevier/Gold Standard (2019-05-28 10:50:46)  Irinotecan injection What is this medicine? IRINOTECAN (ir in oh TEE kan ) is a chemotherapy drug. It is used to treat colon and rectal cancer. This medicine may be used for other purposes; ask your health care provider or pharmacist if you have questions. COMMON BRAND NAME(S): Camptosar What should I tell my health care provider before I take this medicine? They need to know if you have any of these conditions: dehydration diarrhea infection (especially a virus infection such as chickenpox, cold sores, or herpes) liver disease low blood counts, like low white cell, platelet, or red cell counts low levels of calcium, magnesium, or potassium in the blood recent or ongoing radiation therapy an unusual or allergic reaction to irinotecan, other medicines, foods, dyes, or preservatives pregnant or trying to get pregnant breast-feeding How should I use this medicine? This drug is given as an infusion into a vein. It is administered in a hospital or clinic by a specially trained health care professional. Talk to your pediatrician regarding the use of this medicine in children. Special care may be needed. Overdosage: If you think you have taken too much of this medicine contact a poison control center or emergency room at once. NOTE: This medicine is only for you. Do not share this medicine with  others. What if I miss a dose? It is important not to miss your dose. Call your doctor or health care professional if you are unable to keep an appointment. What may interact with this medicine? Do not take this medicine with any of the following medications: cobicistat itraconazole This medicine may interact with the following medications: antiviral medicines for HIV or AIDS certain antibiotics like rifampin or rifabutin certain medicines for fungal infections like ketoconazole, posaconazole, and voriconazole certain medicines for seizures like carbamazepine, phenobarbital, phenotoin clarithromycin gemfibrozil nefazodone St. John's Wort This list may not describe all possible interactions. Give your health care provider a list of all the medicines, herbs, non-prescription drugs, or dietary supplements you use. Also tell them if you smoke, drink alcohol, or use illegal drugs. Some items may interact with your medicine. What should I watch for while using this medicine? Your condition will be monitored carefully while you are receiving this medicine. You will need important blood work done while you are taking this medicine. This drug may make you feel generally unwell. This is not uncommon, as chemotherapy can affect healthy cells as well as cancer cells.  Report any side effects. Continue your course of treatment even though you feel ill unless your doctor tells you to stop. In some cases, you may be given additional medicines to help with side effects. Follow all directions for their use. You may get drowsy or dizzy. Do not drive, use machinery, or do anything that needs mental alertness until you know how this medicine affects you. Do not stand or sit up quickly, especially if you are an older patient. This reduces the risk of dizzy or fainting spells. Call your health care professional for advice if you get a fever, chills, or sore throat, or other symptoms of a cold or flu. Do not treat  yourself. This medicine decreases your body's ability to fight infections. Try to avoid being around people who are sick. Avoid taking products that contain aspirin, acetaminophen, ibuprofen, naproxen, or ketoprofen unless instructed by your doctor. These medicines may hide a fever. This medicine may increase your risk to bruise or bleed. Call your doctor or health care professional if you notice any unusual bleeding. Be careful brushing and flossing your teeth or using a toothpick because you may get an infection or bleed more easily. If you have any dental work done, tell your dentist you are receiving this medicine. Do not become pregnant while taking this medicine or for 6 months after stopping it. Women should inform their health care professional if they wish to become pregnant or think they might be pregnant. Men should not father a child while taking this medicine and for 3 months after stopping it. There is potential for serious side effects to an unborn child. Talk to your health care professional for more information. Do not breast-feed an infant while taking this medicine or for 7 days after stopping it. This medicine has caused ovarian failure in some women. This medicine may make it more difficult to get pregnant. Talk to your health care professional if you are concerned about your fertility. This medicine has caused decreased sperm counts in some men. This may make it more difficult to father a child. Talk to your health care professional if you are concerned about your fertility. What side effects may I notice from receiving this medicine? Side effects that you should report to your doctor or health care professional as soon as possible: allergic reactions like skin rash, itching or hives, swelling of the face, lips, or tongue chest pain diarrhea flushing, runny nose, sweating during infusion low blood counts - this medicine may decrease the number of white blood cells, red blood cells  and platelets. You may be at increased risk for infections and bleeding. nausea, vomiting pain, swelling, warmth in the leg signs of decreased platelets or bleeding - bruising, pinpoint red spots on the skin, black, tarry stools, blood in the urine signs of infection - fever or chills, cough, sore throat, pain or difficulty passing urine signs of decreased red blood cells - unusually weak or tired, fainting spells, lightheadedness Side effects that usually do not require medical attention (report to your doctor or health care professional if they continue or are bothersome): constipation hair loss headache loss of appetite mouth sores stomach pain This list may not describe all possible side effects. Call your doctor for medical advice about side effects. You may report side effects to FDA at 1-800-FDA-1088. Where should I keep my medicine? This drug is given in a hospital or clinic and will not be stored at home. NOTE: This sheet is a summary. It may  not cover all possible information. If you have questions about this medicine, talk to your doctor, pharmacist, or health care provider.  2021 Elsevier/Gold Standard (2019-07-01 17:46:13)  Leucovorin injection What is this medicine? LEUCOVORIN (loo koe VOR in) is used to prevent or treat the harmful effects of some medicines. This medicine is used to treat anemia caused by a low amount of folic acid in the body. It is also used with 5-fluorouracil (5-FU) to treat colon cancer. This medicine may be used for other purposes; ask your health care provider or pharmacist if you have questions. What should I tell my health care provider before I take this medicine? They need to know if you have any of these conditions: anemia from low levels of vitamin B-12 in the blood an unusual or allergic reaction to leucovorin, folic acid, other medicines, foods, dyes, or preservatives pregnant or trying to get pregnant breast-feeding How should I use this  medicine? This medicine is for injection into a muscle or into a vein. It is given by a health care professional in a hospital or clinic setting. Talk to your pediatrician regarding the use of this medicine in children. Special care may be needed. Overdosage: If you think you have taken too much of this medicine contact a poison control center or emergency room at once. NOTE: This medicine is only for you. Do not share this medicine with others. What if I miss a dose? This does not apply. What may interact with this medicine? capecitabine fluorouracil phenobarbital phenytoin primidone trimethoprim-sulfamethoxazole This list may not describe all possible interactions. Give your health care provider a list of all the medicines, herbs, non-prescription drugs, or dietary supplements you use. Also tell them if you smoke, drink alcohol, or use illegal drugs. Some items may interact with your medicine. What should I watch for while using this medicine? Your condition will be monitored carefully while you are receiving this medicine. This medicine may increase the side effects of 5-fluorouracil, 5-FU. Tell your doctor or health care professional if you have diarrhea or mouth sores that do not get better or that get worse. What side effects may I notice from receiving this medicine? Side effects that you should report to your doctor or health care professional as soon as possible: allergic reactions like skin rash, itching or hives, swelling of the face, lips, or tongue breathing problems fever, infection mouth sores unusual bleeding or bruising unusually weak or tired Side effects that usually do not require medical attention (report to your doctor or health care professional if they continue or are bothersome): constipation or diarrhea loss of appetite nausea, vomiting This list may not describe all possible side effects. Call your doctor for medical advice about side effects. You may report  side effects to FDA at 1-800-FDA-1088. Where should I keep my medicine? This drug is given in a hospital or clinic and will not be stored at home. NOTE: This sheet is a summary. It may not cover all possible information. If you have questions about this medicine, talk to your doctor, pharmacist, or health care provider.  2021 Elsevier/Gold Standard (2008-02-04 16:50:29)  Fluorouracil, 5-FU injection What is this medicine? FLUOROURACIL, 5-FU (flure oh YOOR a sil) is a chemotherapy drug. It slows the growth of cancer cells. This medicine is used to treat many types of cancer like breast cancer, colon or rectal cancer, pancreatic cancer, and stomach cancer. This medicine may be used for other purposes; ask your health care provider or pharmacist if you  have questions. COMMON BRAND NAME(S): Adrucil What should I tell my health care provider before I take this medicine? They need to know if you have any of these conditions: blood disorders dihydropyrimidine dehydrogenase (DPD) deficiency infection (especially a virus infection such as chickenpox, cold sores, or herpes) kidney disease liver disease malnourished, poor nutrition recent or ongoing radiation therapy an unusual or allergic reaction to fluorouracil, other chemotherapy, other medicines, foods, dyes, or preservatives pregnant or trying to get pregnant breast-feeding How should I use this medicine? This drug is given as an infusion or injection into a vein. It is administered in a hospital or clinic by a specially trained health care professional. Talk to your pediatrician regarding the use of this medicine in children. Special care may be needed. Overdosage: If you think you have taken too much of this medicine contact a poison control center or emergency room at once. NOTE: This medicine is only for you. Do not share this medicine with others. What if I miss a dose? It is important not to miss your dose. Call your doctor or health  care professional if you are unable to keep an appointment. What may interact with this medicine? Do not take this medicine with any of the following medications: live virus vaccines This medicine may also interact with the following medications: medicines that treat or prevent blood clots like warfarin, enoxaparin, and dalteparin This list may not describe all possible interactions. Give your health care provider a list of all the medicines, herbs, non-prescription drugs, or dietary supplements you use. Also tell them if you smoke, drink alcohol, or use illegal drugs. Some items may interact with your medicine. What should I watch for while using this medicine? Visit your doctor for checks on your progress. This drug may make you feel generally unwell. This is not uncommon, as chemotherapy can affect healthy cells as well as cancer cells. Report any side effects. Continue your course of treatment even though you feel ill unless your doctor tells you to stop. In some cases, you may be given additional medicines to help with side effects. Follow all directions for their use. Call your doctor or health care professional for advice if you get a fever, chills or sore throat, or other symptoms of a cold or flu. Do not treat yourself. This drug decreases your body's ability to fight infections. Try to avoid being around people who are sick. This medicine may increase your risk to bruise or bleed. Call your doctor or health care professional if you notice any unusual bleeding. Be careful brushing and flossing your teeth or using a toothpick because you may get an infection or bleed more easily. If you have any dental work done, tell your dentist you are receiving this medicine. Avoid taking products that contain aspirin, acetaminophen, ibuprofen, naproxen, or ketoprofen unless instructed by your doctor. These medicines may hide a fever. Do not become pregnant while taking this medicine. Women should inform  their doctor if they wish to become pregnant or think they might be pregnant. There is a potential for serious side effects to an unborn child. Talk to your health care professional or pharmacist for more information. Do not breast-feed an infant while taking this medicine. Men should inform their doctor if they wish to father a child. This medicine may lower sperm counts. Do not treat diarrhea with over the counter products. Contact your doctor if you have diarrhea that lasts more than 2 days or if it is severe and watery.  This medicine can make you more sensitive to the sun. Keep out of the sun. If you cannot avoid being in the sun, wear protective clothing and use sunscreen. Do not use sun lamps or tanning beds/booths. What side effects may I notice from receiving this medicine? Side effects that you should report to your doctor or health care professional as soon as possible: allergic reactions like skin rash, itching or hives, swelling of the face, lips, or tongue low blood counts - this medicine may decrease the number of white blood cells, red blood cells and platelets. You may be at increased risk for infections and bleeding. signs of infection - fever or chills, cough, sore throat, pain or difficulty passing urine signs of decreased platelets or bleeding - bruising, pinpoint red spots on the skin, black, tarry stools, blood in the urine signs of decreased red blood cells - unusually weak or tired, fainting spells, lightheadedness breathing problems changes in vision chest pain mouth sores nausea and vomiting pain, swelling, redness at site where injected pain, tingling, numbness in the hands or feet redness, swelling, or sores on hands or feet stomach pain unusual bleeding Side effects that usually do not require medical attention (report to your doctor or health care professional if they continue or are bothersome): changes in finger or toe nails diarrhea dry or itchy skin hair  loss headache loss of appetite sensitivity of eyes to the light stomach upset unusually teary eyes This list may not describe all possible side effects. Call your doctor for medical advice about side effects. You may report side effects to FDA at 1-800-FDA-1088. Where should I keep my medicine? This drug is given in a hospital or clinic and will not be stored at home. NOTE: This sheet is a summary. It may not cover all possible information. If you have questions about this medicine, talk to your doctor, pharmacist, or health care provider.  2021 Elsevier/Gold Standard (2019-07-01 15:00:03)

## 2021-02-12 ENCOUNTER — Other Ambulatory Visit: Payer: Self-pay

## 2021-02-12 ENCOUNTER — Inpatient Hospital Stay: Payer: BC Managed Care – PPO | Attending: Oncology

## 2021-02-12 VITALS — BP 139/59 | HR 77 | Temp 98.6°F | Resp 18 | Ht 67.0 in

## 2021-02-12 DIAGNOSIS — Z5189 Encounter for other specified aftercare: Secondary | ICD-10-CM | POA: Insufficient documentation

## 2021-02-12 DIAGNOSIS — D500 Iron deficiency anemia secondary to blood loss (chronic): Secondary | ICD-10-CM | POA: Diagnosis not present

## 2021-02-12 DIAGNOSIS — Z5112 Encounter for antineoplastic immunotherapy: Secondary | ICD-10-CM | POA: Diagnosis present

## 2021-02-12 DIAGNOSIS — C182 Malignant neoplasm of ascending colon: Secondary | ICD-10-CM | POA: Insufficient documentation

## 2021-02-12 DIAGNOSIS — Z8616 Personal history of COVID-19: Secondary | ICD-10-CM | POA: Diagnosis not present

## 2021-02-12 DIAGNOSIS — C787 Secondary malignant neoplasm of liver and intrahepatic bile duct: Secondary | ICD-10-CM | POA: Insufficient documentation

## 2021-02-12 DIAGNOSIS — Z5111 Encounter for antineoplastic chemotherapy: Secondary | ICD-10-CM | POA: Insufficient documentation

## 2021-02-12 DIAGNOSIS — Z452 Encounter for adjustment and management of vascular access device: Secondary | ICD-10-CM | POA: Insufficient documentation

## 2021-02-12 MED ORDER — SODIUM CHLORIDE 0.9% FLUSH
10.0000 mL | INTRAVENOUS | Status: DC | PRN
  Administered 2021-02-12: 10 mL
  Filled 2021-02-12: qty 10

## 2021-02-12 MED ORDER — HEPARIN SOD (PORK) LOCK FLUSH 100 UNIT/ML IV SOLN
500.0000 [IU] | Freq: Once | INTRAVENOUS | Status: AC | PRN
  Administered 2021-02-12: 500 [IU]
  Filled 2021-02-12: qty 5

## 2021-02-12 MED ORDER — PEGFILGRASTIM-CBQV 6 MG/0.6ML ~~LOC~~ SOSY
6.0000 mg | PREFILLED_SYRINGE | Freq: Once | SUBCUTANEOUS | Status: AC
  Administered 2021-02-12: 6 mg via SUBCUTANEOUS

## 2021-02-26 ENCOUNTER — Other Ambulatory Visit: Payer: Self-pay | Admitting: Oncology

## 2021-03-03 ENCOUNTER — Other Ambulatory Visit: Payer: Self-pay

## 2021-03-03 ENCOUNTER — Other Ambulatory Visit: Payer: Self-pay | Admitting: Oncology

## 2021-03-03 ENCOUNTER — Inpatient Hospital Stay: Payer: BC Managed Care – PPO

## 2021-03-03 ENCOUNTER — Inpatient Hospital Stay: Payer: BC Managed Care – PPO | Admitting: Oncology

## 2021-03-03 VITALS — BP 116/48 | HR 63 | Temp 98.6°F | Resp 18 | Wt 128.8 lb

## 2021-03-03 DIAGNOSIS — Z5112 Encounter for antineoplastic immunotherapy: Secondary | ICD-10-CM | POA: Diagnosis not present

## 2021-03-03 DIAGNOSIS — C182 Malignant neoplasm of ascending colon: Secondary | ICD-10-CM

## 2021-03-03 DIAGNOSIS — D649 Anemia, unspecified: Secondary | ICD-10-CM

## 2021-03-03 LAB — CBC WITH DIFFERENTIAL (CANCER CENTER ONLY)
Abs Immature Granulocytes: 0.05 10*3/uL (ref 0.00–0.07)
Basophils Absolute: 0.1 10*3/uL (ref 0.0–0.1)
Basophils Relative: 1 %
Eosinophils Absolute: 0.2 10*3/uL (ref 0.0–0.5)
Eosinophils Relative: 3 %
HCT: 30.1 % — ABNORMAL LOW (ref 36.0–46.0)
Hemoglobin: 9.4 g/dL — ABNORMAL LOW (ref 12.0–15.0)
Immature Granulocytes: 1 %
Lymphocytes Relative: 17 %
Lymphs Abs: 1.3 10*3/uL (ref 0.7–4.0)
MCH: 32.1 pg (ref 26.0–34.0)
MCHC: 31.2 g/dL (ref 30.0–36.0)
MCV: 102.7 fL — ABNORMAL HIGH (ref 80.0–100.0)
Monocytes Absolute: 0.8 10*3/uL (ref 0.1–1.0)
Monocytes Relative: 11 %
Neutro Abs: 5.2 10*3/uL (ref 1.7–7.7)
Neutrophils Relative %: 67 %
Platelet Count: 124 10*3/uL — ABNORMAL LOW (ref 150–400)
RBC: 2.93 MIL/uL — ABNORMAL LOW (ref 3.87–5.11)
RDW: 16.4 % — ABNORMAL HIGH (ref 11.5–15.5)
WBC Count: 7.7 10*3/uL (ref 4.0–10.5)
nRBC: 0 % (ref 0.0–0.2)

## 2021-03-03 LAB — CMP (CANCER CENTER ONLY)
ALT: 24 U/L (ref 0–44)
AST: 29 U/L (ref 15–41)
Albumin: 3.4 g/dL — ABNORMAL LOW (ref 3.5–5.0)
Alkaline Phosphatase: 187 U/L — ABNORMAL HIGH (ref 38–126)
Anion gap: 6 (ref 5–15)
BUN: 22 mg/dL (ref 8–23)
CO2: 27 mmol/L (ref 22–32)
Calcium: 8.5 mg/dL — ABNORMAL LOW (ref 8.9–10.3)
Chloride: 108 mmol/L (ref 98–111)
Creatinine: 0.61 mg/dL (ref 0.44–1.00)
GFR, Estimated: >60 mL/min (ref >60)
Glucose, Bld: 78 mg/dL (ref 70–99)
Potassium: 4.4 mmol/L (ref 3.5–5.1)
Sodium: 141 mmol/L (ref 135–145)
Total Bilirubin: 0.3 mg/dL (ref 0.3–1.2)
Total Protein: 5.7 g/dL — ABNORMAL LOW (ref 6.5–8.1)

## 2021-03-03 LAB — TOTAL PROTEIN, URINE DIPSTICK

## 2021-03-03 LAB — CEA (ACCESS): CEA (CHCC): 28.3 ng/mL — ABNORMAL HIGH (ref 0.00–5.00)

## 2021-03-03 MED ORDER — SODIUM CHLORIDE 0.9 % IV SOLN
Freq: Once | INTRAVENOUS | Status: AC
  Filled 2021-03-03: qty 250

## 2021-03-03 MED ORDER — SODIUM CHLORIDE 0.9 % IV SOLN
140.0000 mg/m2 | Freq: Once | INTRAVENOUS | Status: AC
  Administered 2021-03-03: 220 mg via INTRAVENOUS
  Filled 2021-03-03: qty 11

## 2021-03-03 MED ORDER — SODIUM CHLORIDE 0.9 % IV SOLN
2500.0000 mg | INTRAVENOUS | Status: DC
  Administered 2021-03-03: 2500 mg via INTRAVENOUS
  Filled 2021-03-03: qty 50

## 2021-03-03 MED ORDER — SODIUM CHLORIDE 0.9 % IV SOLN
10.0000 mg | Freq: Once | INTRAVENOUS | Status: AC
  Administered 2021-03-03: 10 mg via INTRAVENOUS
  Filled 2021-03-03: qty 1

## 2021-03-03 MED ORDER — SODIUM CHLORIDE 0.9 % IV SOLN
200.0000 mg/m2 | Freq: Once | INTRAVENOUS | Status: AC
  Administered 2021-03-03: 318 mg via INTRAVENOUS
  Filled 2021-03-03: qty 15.9

## 2021-03-03 MED ORDER — SODIUM CHLORIDE 0.9 % IV SOLN
300.0000 mg | Freq: Once | INTRAVENOUS | Status: AC
  Administered 2021-03-03: 300 mg via INTRAVENOUS
  Filled 2021-03-03: qty 12

## 2021-03-03 MED ORDER — OXYCODONE-ACETAMINOPHEN 5-325 MG PO TABS: 1.0000 | ORAL_TABLET | ORAL | 0 refills | Status: DC | PRN

## 2021-03-03 MED ORDER — SODIUM CHLORIDE 0.9 % IV SOLN
Freq: Once | INTRAVENOUS | Status: AC
Start: 2021-03-03 — End: 2021-03-03
  Filled 2021-03-03: qty 250

## 2021-03-03 MED ORDER — ATROPINE SULFATE 1 MG/ML IJ SOLN
0.5000 mg | Freq: Once | INTRAMUSCULAR | Status: AC | PRN
  Administered 2021-03-03: 0.5 mg via INTRAVENOUS
  Filled 2021-03-03: qty 1

## 2021-03-03 MED ORDER — PALONOSETRON HCL INJECTION 0.25 MG/5ML
0.2500 mg | Freq: Once | INTRAVENOUS | Status: AC
  Administered 2021-03-03: 0.25 mg via INTRAVENOUS
  Filled 2021-03-03: qty 5

## 2021-03-03 NOTE — Progress Notes (Signed)
[place] OFFICE PROGRESS NOTE   Diagnosis: Colon cancer  INTERVAL HISTORY:   [name] completed another cycle of FOLFIRI/Avastin on 02/10/2021.  She reports mild diarrhea following chemotherapy.  The diarrhea is relieved with Imodium.  No nausea or mouth sores.  She has bleeding when she blows her nose.  No other bleeding.  She bruises easily.  She continues Lovenox.  Objective:  Vital signs in last 24 hours:  Blood pressure (!) 116/48, pulse 63, temperature 98.6 F (37 C), temperature source Oral, resp. rate 18, weight 128 lb 12.8 oz (58.4 kg).    HEENT: No thrush or ulcers Resp: Lungs clear bilaterally Cardio: Regular rate and rhythm GI: No hepatosplenomegaly Vascular: No leg edema  Skin: Multiple ecchymoses at the left greater than right forearm, induration/ecchymosis at abdominal wall injection sites  Portacath/PICC-without erythema  Lab Results:  Lab Results  Component Value Date   WBC 7.0 02/10/2021   HGB 8.9 (L) 02/10/2021   HCT 28.8 (L) 02/10/2021   MCV 102.9 (H) 02/10/2021   PLT 136 (L) 02/10/2021   NEUTROABS 4.9 02/10/2021    CMP  Lab Results  Component Value Date   NA 141 02/10/2021   K 4.4 02/10/2021   CL 109 02/10/2021   CO2 25 02/10/2021   GLUCOSE 77 02/10/2021   BUN 19 02/10/2021   CREATININE 0.69 02/10/2021   CALCIUM 8.8 (L) 02/10/2021   PROT 5.9 (L) 02/10/2021   ALBUMIN 3.4 (L) 02/10/2021   AST 45 (H) 02/10/2021   ALT 40 02/10/2021   ALKPHOS 203 (H) 02/10/2021   BILITOT 0.3 02/10/2021   GFRNONAA >60 02/10/2021   GFRAA >60 05/06/2020    Lab Results  Component Value Date   CEA1 54.57 (H) 12/30/2020   CEA 31.21 (H) 02/10/2021    Medications: I have reviewed the patient's current medications.   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  Cirrhosis.  Innumerable liver lesions. Colonoscopy  12/18/2019-ulcerated partially obstructing large mass in the mid ascending colon.  Biopsy invasive adenocarcinoma; preserved expression major MMR proteins Upper endoscopy 12/18/2019-normal esophagus, stomach, examined duodenum CEA 12/19/2019-8,923 Biopsy liver lesion 12/23/2019-adenocarcinoma Foundation 1-K-ras wild-type; tumor mutation burden greater than or equal to 10; microsatellite stable; BRAF V600E Cycle 1 FOLFOX 01/01/2020 Cycle 2 FOLFOX 01/15/2020  Cycle 3 FOLFOX 01/29/2020, Udenyca added Cycle 4 FOLFOX 02/12/2020, Udenyca held Cycle 5 FOLFOX 02/26/2020, Udenyca Cycle 6 FOLFOX 03/11/2020, Udenyca held CT abdomen/pelvis 03/16/2020-stable diffuse liver lesions, stable strictured appearing ascending colon, possible pericolonic implant, vague left lower lobe nodule Cycle 1 FOLFIRI/Avastin 04/06/2020 Cycle 2 FOLFIRI/Avastin 04/21/2020 Cycle 3 FOLFIRI/Avastin 05/06/2020 Cycle 4 FOLFIRI/Avastin 05/20/2020 Cycle 5 FOLFIRI/Avastin 06/03/2020 CTs 06/15/2020-mild to moderate improvement in hepatic metastasis.  No new or progressive disease.  Narrowing within the proximal ascending colon with upstream mild cecal and terminal ileum dilatation, similar. Cycle 6 FOLFIRI/Avastin 06/17/2020 Cycle 7 FOLFIRI/Avastin 07/01/2020 Cycle 8 FOLFIRI/Avastin 07/15/2020 Cycle 9 FOLFIRI/Avastin 07/29/2020-5-FU bolus eliminated, 5-FU infusion and irinotecan dose reduced Cycle 10 FOLFIRI/Avastin 08/12/2020 CTs 08/23/2020- mild residual wall thickening involving the cecum/ascending colon.  Numerous liver metastases improved. Cycle 11 FOLFIRI/Avastin 08/25/2020 Cycle 12 FOLFIRI/Avastin 09/16/2020 Cycle 13 FOLFIRI/Avastin 10/07/2020 Cycle 14 FOLFIRI/Avastin 10/28/2020 Cycle 15 FOLFIRI/Avastin 11/18/2020 CT abdomen/pelvis 12/06/2020-mild improvement in multifocal hepatic metastases, wall thickening at the ascending colon with pericolonic inflammatory changes Cycle 16 FOLFIRI/Avastin 12/09/2020 Cycle 17 FOLFIRI/Avastin 12/30/2020 Cycle 18  FOLFIRI/Avastin 01/20/2021 Cycle 19 FOLFIRI/Avastin 02/10/2021 Cycle 20 FOLFIRI/Avastin 03/03/2021 Anemia  secondary to GI blood loss, on oral iron BID  Cirrhosis 12/26/2019-hospital admission for right lower extremity DVT and GI bleed, treated with heparin anticoagulation beginning 12/26/2019, converted to Lovenox at discharge 12/29/2019 Lovenox reduced to 40 mg daily 02/10/2021 secondary to bruising and nosebleeding History of low-grade fever-likely "tumor" fever COVID-19 infection January 2021   Disposition: Stacy Burns appears stable.  She continues to tolerate chemotherapy well.  She will complete another cycle of FOLFIRI/Avastin today.  The CEA was lower when she was here on 02/10/2021.  We will follow-up on the CEA from today.  [name] will return for an office visit and chemotherapy in 3 weeks.  She will be scheduled for restaging CTs within the next 2 months.  [name], MD  03/03/2021  8:29 AM

## 2021-03-03 NOTE — Progress Notes (Signed)
Ok to treat with trace of urine protein per Dr. Benay Spice.

## 2021-03-03 NOTE — Patient Instructions (Signed)
[facility]  Discharge Instructions: Thank you for choosing [facility] to provide your oncology and hematology care.   If you have a lab appointment with the [facility], please go directly to the [facility] and check in at the registration area.   Wear comfortable clothing and clothing appropriate for easy access to any Portacath or PICC line.   We strive to give you quality time with your provider. You may need to reschedule your appointment if you arrive late (15 or more minutes).  Arriving late affects you and other patients whose appointments are after yours.  Also, if you miss three or more appointments without notifying the office, you may be dismissed from the clinic at the provider's discretion.      For prescription refill requests, have your pharmacy contact our office and allow 72 hours for refills to be completed.    Today you received the following chemotherapy and/or immunotherapy agents bevacizumab-bvzr, irinotecan, leucovorin, fluorouracil    To help prevent nausea and vomiting after your treatment, we encourage you to take your nausea medication as directed.  BELOW ARE SYMPTOMS THAT SHOULD BE REPORTED IMMEDIATELY: *FEVER GREATER THAN 100.4 F (38 C) OR HIGHER *CHILLS OR SWEATING *NAUSEA AND VOMITING THAT IS NOT CONTROLLED WITH YOUR NAUSEA MEDICATION *UNUSUAL SHORTNESS OF BREATH *UNUSUAL BRUISING OR BLEEDING *URINARY PROBLEMS (pain or burning when urinating, or frequent urination) *BOWEL PROBLEMS (unusual diarrhea, constipation, pain near the anus) TENDERNESS IN MOUTH AND THROAT WITH OR WITHOUT PRESENCE OF ULCERS (sore throat, sores in mouth, or a toothache) UNUSUAL RASH, SWELLING OR PAIN  UNUSUAL VAGINAL DISCHARGE OR ITCHING   Items with * indicate a potential emergency and should be followed up as soon as possible or go to the Emergency Department if any problems should occur.  Please show the CHEMOTHERAPY ALERT CARD or  IMMUNOTHERAPY ALERT CARD at check-in to the Emergency Department and triage nurse.  Should you have questions after your visit or need to cancel or reschedule your appointment, please contact [facility]  Dept: [phone]  and follow the prompts.  Office hours are 8:00 a.m. to 4:30 p.m. Monday - Friday. Please note that voicemails left after 4:00 p.m. may not be returned until the following business day.  We are closed weekends and major holidays. You have access to a nurse at all times for urgent questions. Please call the main number to the clinic Dept: [phone] and follow the prompts.   For any non-urgent questions, you may also contact your provider using MyChart. We now offer e-Visits for anyone 61 and older to request care online for non-urgent symptoms. For details visit [email or web address].   Also download the MyChart app! Go to the app store, search "MyChart", open the app, select [facility], and log in with your MyChart username and password.  Due to Covid, a mask is required upon entering the hospital/clinic. If you do not have a mask, one will be given to you upon arrival. For doctor visits, patients may have 1 support person aged 4 or older with them. For treatment visits, patients cannot have anyone with them due to current Covid guidelines and our immunocompromised population.   Bevacizumab injection What is this medication? BEVACIZUMAB (be va SIZ yoo mab) is a monoclonal antibody. It is used to treatmany types of cancer. This medicine may be used for other purposes; ask your health care provider orpharmacist if you have questions. COMMON BRAND NAME(S): Avastin, MVASI, [name]  What should I tell my care team before I take this medication? They need to know if you have any of these conditions: diabetes heart disease high blood pressure history of coughing up blood prior anthracycline chemotherapy (e.g., doxorubicin, daunorubicin,  epirubicin) recent or ongoing radiation therapy recent or planning to have surgery stroke an unusual or allergic reaction to bevacizumab, hamster proteins, mouse proteins, other medicines, foods, dyes, or preservatives pregnant or trying to get pregnant breast-feeding How should I use this medication? This medicine is for infusion into a vein. It is given by a health careprofessional in a hospital or clinic setting. Talk to your pediatrician regarding the use of this medicine in children.Special care may be needed. Overdosage: If you think you have taken too much of this medicine contact apoison control center or emergency room at once. NOTE: This medicine is only for you. Do not share this medicine with others. What if I miss a dose? It is important not to miss your dose. Call your doctor or health careprofessional if you are unable to keep an appointment. What may interact with this medication? Interactions are not expected. This list may not describe all possible interactions. Give your health care provider a list of all the medicines, herbs, non-prescription drugs, or dietary supplements you use. Also tell them if you smoke, drink alcohol, or use illegaldrugs. Some items may interact with your medicine. What should I watch for while using this medication? Your condition will be monitored carefully while you are receiving this medicine. You will need important blood work and urine testing done while youare taking this medicine. This medicine may increase your risk to bruise or bleed. Call your doctor orhealth care professional if you notice any unusual bleeding. Before having surgery, talk to your health care provider to make sure it is ok. This drug can increase the risk of poor healing of your surgical site or wound. You will need to stop this drug for 28 days before surgery. After surgery, wait at least 28 days before restarting this drug. Make sure the surgical site or wound is healed  enough before restarting this drug. Talk to your health careprovider if questions. Do not become pregnant while taking this medicine or for 6 months after stopping it. Women should inform their doctor if they wish to become pregnant or think they might be pregnant. There is a potential for serious side effects to an unborn child. Talk to your health care professional or pharmacist for more information. Do not breast-feed an infant while taking this medicine andfor 6 months after the last dose. This medicine has caused ovarian failure in some women. This medicine may interfere with the ability to have a child. You should talk to your doctor orhealth care professional if you are concerned about your fertility. What side effects may I notice from receiving this medication? Side effects that you should report to your doctor or health care professionalas soon as possible: allergic reactions like skin rash, itching or hives, swelling of the face, lips, or tongue chest pain or chest tightness chills coughing up blood high fever seizures severe constipation signs and symptoms of bleeding such as bloody or black, tarry stools; red or dark-First urine; spitting up blood or [place] material that looks like coffee grounds; red spots on the skin; unusual bruising or bleeding from the eye, gums, or nose signs and symptoms of a blood clot such as breathing problems; chest pain; severe, sudden headache; pain, swelling, warmth in the leg signs and  symptoms of a stroke like changes in vision; confusion; trouble speaking or understanding; severe headaches; sudden numbness or weakness of the face, arm or leg; trouble walking; dizziness; loss of balance or coordination stomach pain sweating swelling of legs or ankles vomiting weight gain Side effects that usually do not require medical attention (report to yourdoctor or health care professional if they continue or are bothersome): back pain changes in  taste decreased appetite dry skin nausea tiredness This list may not describe all possible side effects. Call your doctor for medical advice about side effects. You may report side effects to FDA at1-800-FDA-1088. Where should I keep my medication? This drug is given in a hospital or clinic and will not be stored at home. NOTE: This sheet is a summary. It may not cover all possible information. If you have questions about this medicine, talk to your doctor, pharmacist, orhealth care provider.  2022 Elsevier/Gold Standard (2019-05-28 10:50:46)  Irinotecan injection What is this medication? IRINOTECAN (ir in oh TEE kan ) is a chemotherapy drug. It is used to treatcolon and rectal cancer. This medicine may be used for other purposes; ask your health care provider orpharmacist if you have questions. COMMON BRAND NAME(S): Camptosar What should I tell my care team before I take this medication? They need to know if you have any of these conditions: dehydration diarrhea infection (especially a virus infection such as chickenpox, cold sores, or herpes) liver disease low blood counts, like low white cell, platelet, or red cell counts low levels of calcium, magnesium, or potassium in the blood recent or ongoing radiation therapy an unusual or allergic reaction to irinotecan, other medicines, foods, dyes, or preservatives pregnant or trying to get pregnant breast-feeding How should I use this medication? This drug is given as an infusion into a vein. It is administered in a hospitalor clinic by a specially trained health care professional. Talk to your pediatrician regarding the use of this medicine in children.Special care may be needed. Overdosage: If you think you have taken too much of this medicine contact apoison control center or emergency room at once. NOTE: This medicine is only for you. Do not share this medicine with others. What if I miss a dose? It is important not to miss your  dose. Call your doctor or health careprofessional if you are unable to keep an appointment. What may interact with this medication? Do not take this medicine with any of the following medications: cobicistat itraconazole This medicine may interact with the following medications: antiviral medicines for HIV or AIDS certain antibiotics like rifampin or rifabutin certain medicines for fungal infections like ketoconazole, posaconazole, and voriconazole certain medicines for seizures like carbamazepine, phenobarbital, phenotoin clarithromycin gemfibrozil nefazodone St. John's Wort This list may not describe all possible interactions. Give your health care provider a list of all the medicines, herbs, non-prescription drugs, or dietary supplements you use. Also tell them if you smoke, drink alcohol, or use illegaldrugs. Some items may interact with your medicine. What should I watch for while using this medication? Your condition will be monitored carefully while you are receiving this medicine. You will need important blood work done while you are taking thismedicine. This drug may make you feel generally unwell. This is not uncommon, as chemotherapy can affect healthy cells as well as cancer cells. Report any side effects. Continue your course of treatment even though you feel ill unless yourdoctor tells you to stop. In some cases, you may be given additional medicines to help with  side effects.Follow all directions for their use. You may get drowsy or dizzy. Do not drive, use machinery, or do anything that needs mental alertness until you know how this medicine affects you. Do not stand or sit up quickly, especially if you are an older patient. This reducesthe risk of dizzy or fainting spells. Call your health care professional for advice if you get a fever, chills, or sore throat, or other symptoms of a cold or flu. Do not treat yourself. This medicine decreases your body's ability to fight  infections. Try to avoid beingaround people who are sick. Avoid taking products that contain aspirin, acetaminophen, ibuprofen, naproxen, or ketoprofen unless instructed by your doctor. These medicines may hide afever. This medicine may increase your risk to bruise or bleed. Call your doctor orhealth care professional if you notice any unusual bleeding. Be careful brushing and flossing your teeth or using a toothpick because you may get an infection or bleed more easily. If you have any dental work done,tell your dentist you are receiving this medicine. Do not become pregnant while taking this medicine or for 6 months after stopping it. Women should inform their health care professional if they wish to become pregnant or think they might be pregnant. Men should not father a child while taking this medicine and for 3 months after stopping it. There is potential for serious side effects to an unborn child. Talk to your health careprofessional for more information. Do not breast-feed an infant while taking this medicine or for 7 days afterstopping it. This medicine has caused ovarian failure in some women. This medicine may make it more difficult to get pregnant. Talk to your health care professional if [place] concerned about your fertility. This medicine has caused decreased sperm counts in some men. This may make it more difficult to father a child. Talk to your health care professional if [place] concerned about your fertility. What side effects may I notice from receiving this medication? Side effects that you should report to your doctor or health care professionalas soon as possible: allergic reactions like skin rash, itching or hives, swelling of the face, lips, or tongue chest pain diarrhea flushing, runny nose, sweating during infusion low blood counts - this medicine may decrease the number of white blood cells, red blood cells and platelets. You may be at increased risk for infections and  bleeding. nausea, vomiting pain, swelling, warmth in the leg signs of decreased platelets or bleeding - bruising, pinpoint red spots on the skin, black, tarry stools, blood in the urine signs of infection - fever or chills, cough, sore throat, pain or difficulty passing urine signs of decreased red blood cells - unusually weak or tired, fainting spells, lightheadedness Side effects that usually do not require medical attention (report to yourdoctor or health care professional if they continue or are bothersome): constipation hair loss headache loss of appetite mouth sores stomach pain This list may not describe all possible side effects. Call your doctor for medical advice about side effects. You may report side effects to FDA at1-800-FDA-1088. Where should I keep my medication? This drug is given in a hospital or clinic and will not be stored at home. NOTE: This sheet is a summary. It may not cover all possible information. If you have questions about this medicine, talk to your doctor, pharmacist, orhealth care provider.  2022 Elsevier/Gold Standard (2019-07-01 17:46:13) Leucovorin injection What is this medication? LEUCOVORIN (loo koe VOR in) is used to prevent or treat the harmful effects  of some medicines. This medicine is used to treat anemia caused by a low amount of folic acid in the body. It is also used with 5-fluorouracil (5-FU) to treatcolon cancer. This medicine may be used for other purposes; ask your health care provider orpharmacist if you have questions. What should I tell my care team before I take this medication? They need to know if you have any of these conditions: anemia from low levels of vitamin B-12 in the blood an unusual or allergic reaction to leucovorin, folic acid, other medicines, foods, dyes, or preservatives pregnant or trying to get pregnant breast-feeding How should I use this medication? This medicine is for injection into a muscle or into a vein. It  is given by ahealth care professional in a hospital or clinic setting. Talk to your pediatrician regarding the use of this medicine in children.Special care may be needed. Overdosage: If you think you have taken too much of this medicine contact apoison control center or emergency room at once. NOTE: This medicine is only for you. Do not share this medicine with others. What if I miss a dose? This does not apply. What may interact with this medication? capecitabine fluorouracil phenobarbital phenytoin primidone trimethoprim-sulfamethoxazole This list may not describe all possible interactions. Give your health care provider a list of all the medicines, herbs, non-prescription drugs, or dietary supplements you use. Also tell them if you smoke, drink alcohol, or use illegaldrugs. Some items may interact with your medicine. What should I watch for while using this medication? Your condition will be monitored carefully while you are receiving thismedicine. This medicine may increase the side effects of 5-fluorouracil, 5-FU. Tell your doctor or health care professional if you have diarrhea or mouth sores that donot get better or that get worse. What side effects may I notice from receiving this medication? Side effects that you should report to your doctor or health care professionalas soon as possible: allergic reactions like skin rash, itching or hives, swelling of the face, lips, or tongue breathing problems fever, infection mouth sores unusual bleeding or bruising unusually weak or tired Side effects that usually do not require medical attention (report to yourdoctor or health care professional if they continue or are bothersome): constipation or diarrhea loss of appetite nausea, vomiting This list may not describe all possible side effects. Call your doctor for medical advice about side effects. You may report side effects to FDA at1-800-FDA-1088. Where should I keep my  medication? This drug is given in a hospital or clinic and will not be stored at home. NOTE: This sheet is a summary. It may not cover all possible information. If you have questions about this medicine, talk to your doctor, pharmacist, orhealth care provider.  2022 Elsevier/Gold Standard (2008-02-04 16:50:29)  Fluorouracil, 5-FU injection What is this medication? FLUOROURACIL, 5-FU (flure oh YOOR a sil) is a chemotherapy drug. It slows the growth of cancer cells. This medicine is used to treat many types of cancer like breast cancer, colon or rectal cancer, pancreatic cancer, and stomachcancer. This medicine may be used for other purposes; ask your health care provider orpharmacist if you have questions. COMMON BRAND NAME(S): Adrucil What should I tell my care team before I take this medication? They need to know if you have any of these conditions: blood disorders dihydropyrimidine dehydrogenase (DPD) deficiency infection (especially a virus infection such as chickenpox, cold sores, or herpes) kidney disease liver disease malnourished, poor nutrition recent or ongoing radiation therapy an unusual or allergic  reaction to fluorouracil, other chemotherapy, other medicines, foods, dyes, or preservatives pregnant or trying to get pregnant breast-feeding How should I use this medication? This drug is given as an infusion or injection into a vein. It is administeredin a hospital or clinic by a specially trained health care professional. Talk to your pediatrician regarding the use of this medicine in children.Special care may be needed. Overdosage: If you think you have taken too much of this medicine contact apoison control center or emergency room at once. NOTE: This medicine is only for you. Do not share this medicine with others. What if I miss a dose? It is important not to miss your dose. Call your doctor or health careprofessional if you are unable to keep an appointment. What may  interact with this medication? Do not take this medicine with any of the following medications: live virus vaccines This medicine may also interact with the following medications: medicines that treat or prevent blood clots like warfarin, enoxaparin, and dalteparin This list may not describe all possible interactions. Give your health care provider a list of all the medicines, herbs, non-prescription drugs, or dietary supplements you use. Also tell them if you smoke, drink alcohol, or use illegaldrugs. Some items may interact with your medicine. What should I watch for while using this medication? Visit your doctor for checks on your progress. This drug may make you feel generally unwell. This is not uncommon, as chemotherapy can affect healthy cells as well as cancer cells. Report any side effects. Continue your course oftreatment even though you feel ill unless your doctor tells you to stop. In some cases, you may be given additional medicines to help with side effects.Follow all directions for their use. Call your doctor or health care professional for advice if you get a fever, chills or sore throat, or other symptoms of a cold or flu. Do not treat yourself. This drug decreases your body's ability to fight infections. Try toavoid being around people who are sick. This medicine may increase your risk to bruise or bleed. Call your doctor orhealth care professional if you notice any unusual bleeding. Be careful brushing and flossing your teeth or using a toothpick because you may get an infection or bleed more easily. If you have any dental work done,tell your dentist you are receiving this medicine. Avoid taking products that contain aspirin, acetaminophen, ibuprofen, naproxen, or ketoprofen unless instructed by your doctor. These medicines may hide afever. Do not become pregnant while taking this medicine. Women should inform their doctor if they wish to become pregnant or think they might be  pregnant. There is a potential for serious side effects to an unborn child. Talk to your health care professional or pharmacist for more information. Do not breast-feed aninfant while taking this medicine. Men should inform their doctor if they wish to father a child. This medicinemay lower sperm counts. Do not treat diarrhea with over the counter products. Contact your doctor ifyou have diarrhea that lasts more than 2 days or if it is severe and watery. This medicine can make you more sensitive to the sun. Keep out of the sun. If you cannot avoid being in the sun, wear protective clothing and use sunscreen.Do not use sun lamps or tanning beds/booths. What side effects may I notice from receiving this medication? Side effects that you should report to your doctor or health care professionalas soon as possible: allergic reactions like skin rash, itching or hives, swelling of the face, lips, or tongue low  blood counts - this medicine may decrease the number of white blood cells, red blood cells and platelets. You may be at increased risk for infections and bleeding. signs of infection - fever or chills, cough, sore throat, pain or difficulty passing urine signs of decreased platelets or bleeding - bruising, pinpoint red spots on the skin, black, tarry stools, blood in the urine signs of decreased red blood cells - unusually weak or tired, fainting spells, lightheadedness breathing problems changes in vision chest pain mouth sores nausea and vomiting pain, swelling, redness at site where injected pain, tingling, numbness in the hands or feet redness, swelling, or sores on hands or feet stomach pain unusual bleeding Side effects that usually do not require medical attention (report to yourdoctor or health care professional if they continue or are bothersome): changes in finger or toe nails diarrhea dry or itchy skin hair loss headache loss of appetite sensitivity of eyes to the light stomach  upset unusually teary eyes This list may not describe all possible side effects. Call your doctor for medical advice about side effects. You may report side effects to FDA at1-800-FDA-1088. Where should I keep my medication? This drug is given in a hospital or clinic and will not be stored at home. NOTE: This sheet is a summary. It may not cover all possible information. If you have questions about this medicine, talk to your doctor, pharmacist, orhealth care provider.  2022 Elsevier/Gold Standard (2019-07-01 15:00:03)

## 2021-03-05 ENCOUNTER — Inpatient Hospital Stay: Payer: BC Managed Care – PPO

## 2021-03-05 ENCOUNTER — Other Ambulatory Visit: Payer: Self-pay

## 2021-03-05 VITALS — BP 126/55 | HR 63 | Temp 97.9°F

## 2021-03-05 DIAGNOSIS — C182 Malignant neoplasm of ascending colon: Secondary | ICD-10-CM

## 2021-03-05 DIAGNOSIS — Z95828 Presence of other vascular implants and grafts: Secondary | ICD-10-CM

## 2021-03-05 DIAGNOSIS — Z5112 Encounter for antineoplastic immunotherapy: Secondary | ICD-10-CM | POA: Diagnosis not present

## 2021-03-05 MED ORDER — HEPARIN SOD (PORK) LOCK FLUSH 100 UNIT/ML IV SOLN
500.0000 [IU] | Freq: Once | INTRAVENOUS | Status: AC
  Administered 2021-03-05: 500 [IU]
  Filled 2021-03-05: qty 5

## 2021-03-05 MED ORDER — PEGFILGRASTIM-CBQV 6 MG/0.6ML ~~LOC~~ SOSY
6.0000 mg | PREFILLED_SYRINGE | Freq: Once | SUBCUTANEOUS | Status: AC
  Administered 2021-03-05: 6 mg via SUBCUTANEOUS

## 2021-03-05 MED ORDER — PEGFILGRASTIM-CBQV 6 MG/0.6ML ~~LOC~~ SOSY
PREFILLED_SYRINGE | SUBCUTANEOUS | Status: AC
  Filled 2021-03-05: qty 0.6

## 2021-03-05 MED ORDER — SODIUM CHLORIDE 0.9% FLUSH
10.0000 mL | Freq: Once | INTRAVENOUS | Status: AC
Start: 2021-03-05 — End: 2021-03-05
  Administered 2021-03-05: 10 mL
  Filled 2021-03-05: qty 10

## 2021-03-05 NOTE — Patient Instructions (Signed)

## 2021-03-11 ENCOUNTER — Other Ambulatory Visit: Payer: Self-pay | Admitting: Oncology

## 2021-03-17 ENCOUNTER — Other Ambulatory Visit: Payer: Self-pay | Admitting: Oncology

## 2021-03-24 ENCOUNTER — Inpatient Hospital Stay: Payer: BC Managed Care – PPO

## 2021-03-24 ENCOUNTER — Other Ambulatory Visit: Payer: Self-pay

## 2021-03-24 ENCOUNTER — Inpatient Hospital Stay: Payer: BC Managed Care – PPO | Admitting: Nurse Practitioner

## 2021-03-24 ENCOUNTER — Inpatient Hospital Stay: Payer: BC Managed Care – PPO | Attending: Oncology

## 2021-03-24 ENCOUNTER — Encounter: Payer: Self-pay | Admitting: Nurse Practitioner

## 2021-03-24 VITALS — BP 148/61 | HR 62 | Temp 97.8°F | Resp 18 | Ht 67.0 in | Wt 127.4 lb

## 2021-03-24 DIAGNOSIS — Z5189 Encounter for other specified aftercare: Secondary | ICD-10-CM | POA: Insufficient documentation

## 2021-03-24 DIAGNOSIS — C182 Malignant neoplasm of ascending colon: Secondary | ICD-10-CM | POA: Insufficient documentation

## 2021-03-24 DIAGNOSIS — Z5112 Encounter for antineoplastic immunotherapy: Secondary | ICD-10-CM | POA: Insufficient documentation

## 2021-03-24 DIAGNOSIS — Z452 Encounter for adjustment and management of vascular access device: Secondary | ICD-10-CM | POA: Insufficient documentation

## 2021-03-24 DIAGNOSIS — D500 Iron deficiency anemia secondary to blood loss (chronic): Secondary | ICD-10-CM | POA: Insufficient documentation

## 2021-03-24 DIAGNOSIS — Z7901 Long term (current) use of anticoagulants: Secondary | ICD-10-CM | POA: Diagnosis not present

## 2021-03-24 DIAGNOSIS — D696 Thrombocytopenia, unspecified: Secondary | ICD-10-CM | POA: Diagnosis not present

## 2021-03-24 DIAGNOSIS — Z86718 Personal history of other venous thrombosis and embolism: Secondary | ICD-10-CM | POA: Diagnosis not present

## 2021-03-24 DIAGNOSIS — C787 Secondary malignant neoplasm of liver and intrahepatic bile duct: Secondary | ICD-10-CM | POA: Insufficient documentation

## 2021-03-24 DIAGNOSIS — Z5111 Encounter for antineoplastic chemotherapy: Secondary | ICD-10-CM | POA: Diagnosis not present

## 2021-03-24 LAB — CBC WITH DIFFERENTIAL (CANCER CENTER ONLY)
Abs Immature Granulocytes: 0.04 10*3/uL (ref 0.00–0.07)
Basophils Absolute: 0.1 10*3/uL (ref 0.0–0.1)
Basophils Relative: 1 %
Eosinophils Absolute: 0.2 10*3/uL (ref 0.0–0.5)
Eosinophils Relative: 2 %
HCT: 31.9 % — ABNORMAL LOW (ref 36.0–46.0)
Hemoglobin: 10 g/dL — ABNORMAL LOW (ref 12.0–15.0)
Immature Granulocytes: 1 %
Lymphocytes Relative: 16 %
Lymphs Abs: 1.3 10*3/uL (ref 0.7–4.0)
MCH: 31.5 pg (ref 26.0–34.0)
MCHC: 31.3 g/dL (ref 30.0–36.0)
MCV: 100.6 fL — ABNORMAL HIGH (ref 80.0–100.0)
Monocytes Absolute: 0.8 10*3/uL (ref 0.1–1.0)
Monocytes Relative: 10 %
Neutro Abs: 5.6 10*3/uL (ref 1.7–7.7)
Neutrophils Relative %: 70 %
Platelet Count: 120 10*3/uL — ABNORMAL LOW (ref 150–400)
RBC: 3.17 MIL/uL — ABNORMAL LOW (ref 3.87–5.11)
RDW: 15.6 % — ABNORMAL HIGH (ref 11.5–15.5)
WBC Count: 8 10*3/uL (ref 4.0–10.5)
nRBC: 0 % (ref 0.0–0.2)

## 2021-03-24 LAB — TOTAL PROTEIN, URINE DIPSTICK: Protein, ur: NEGATIVE mg/dL

## 2021-03-24 LAB — CMP (CANCER CENTER ONLY)
ALT: 29 U/L (ref 0–44)
AST: 33 U/L (ref 15–41)
Albumin: 3.6 g/dL (ref 3.5–5.0)
Alkaline Phosphatase: 178 U/L — ABNORMAL HIGH (ref 38–126)
Anion gap: 6 (ref 5–15)
BUN: 21 mg/dL (ref 8–23)
CO2: 27 mmol/L (ref 22–32)
Calcium: 8.5 mg/dL — ABNORMAL LOW (ref 8.9–10.3)
Chloride: 108 mmol/L (ref 98–111)
Creatinine: 0.56 mg/dL (ref 0.44–1.00)
GFR, Estimated: >60 mL/min (ref >60)
Glucose, Bld: 79 mg/dL (ref 70–99)
Potassium: 4.5 mmol/L (ref 3.5–5.1)
Sodium: 141 mmol/L (ref 135–145)
Total Bilirubin: 0.3 mg/dL (ref 0.3–1.2)
Total Protein: 6.2 g/dL — ABNORMAL LOW (ref 6.5–8.1)

## 2021-03-24 LAB — CEA (ACCESS): CEA (CHCC): 25.95 ng/mL — ABNORMAL HIGH (ref 0.00–5.00)

## 2021-03-24 MED ORDER — SODIUM CHLORIDE 0.9 % IV SOLN
300.0000 mg | Freq: Once | INTRAVENOUS | Status: AC
  Administered 2021-03-24: 300 mg via INTRAVENOUS
  Filled 2021-03-24: qty 12

## 2021-03-24 MED ORDER — ATROPINE SULFATE 1 MG/ML IJ SOLN
0.5000 mg | Freq: Once | INTRAMUSCULAR | Status: AC | PRN
  Administered 2021-03-24: 0.5 mg via INTRAVENOUS
  Filled 2021-03-24: qty 1

## 2021-03-24 MED ORDER — PALONOSETRON HCL INJECTION 0.25 MG/5ML
0.2500 mg | Freq: Once | INTRAVENOUS | Status: AC
  Administered 2021-03-24: 0.25 mg via INTRAVENOUS
  Filled 2021-03-24: qty 5

## 2021-03-24 MED ORDER — SODIUM CHLORIDE 0.9 % IV SOLN
10.0000 mg | Freq: Once | INTRAVENOUS | Status: AC
  Administered 2021-03-24: 10 mg via INTRAVENOUS
  Filled 2021-03-24: qty 1

## 2021-03-24 MED ORDER — SODIUM CHLORIDE 0.9 % IV SOLN
2500.0000 mg | INTRAVENOUS | Status: DC
  Administered 2021-03-24: 2500 mg via INTRAVENOUS
  Filled 2021-03-24: qty 50

## 2021-03-24 MED ORDER — SODIUM CHLORIDE 0.9 % IV SOLN
140.0000 mg/m2 | Freq: Once | INTRAVENOUS | Status: AC
  Administered 2021-03-24: 220 mg via INTRAVENOUS
  Filled 2021-03-24: qty 11

## 2021-03-24 MED ORDER — SODIUM CHLORIDE 0.9% FLUSH
10.0000 mL | INTRAVENOUS | Status: DC | PRN
  Administered 2021-03-24: 10 mL
  Filled 2021-03-24: qty 10

## 2021-03-24 MED ORDER — SODIUM CHLORIDE 0.9 % IV SOLN
200.0000 mg/m2 | Freq: Once | INTRAVENOUS | Status: AC
  Administered 2021-03-24: 318 mg via INTRAVENOUS
  Filled 2021-03-24: qty 15.9

## 2021-03-24 MED ORDER — SODIUM CHLORIDE 0.9 % IV SOLN
Freq: Once | INTRAVENOUS | Status: AC
  Filled 2021-03-24: qty 250

## 2021-03-24 NOTE — Patient Instructions (Addendum)
The chemotherapy medication bag should finish at 46 hours, 96 hours, or 7 days. For example, if your pump is scheduled for 46 hours and it was put on at 4:00 p.m., it should finish at 2:00 p.m. the day it is scheduled to come off regardless of your appointment time.     Estimated time to finish at 10am on 03/26/21. .   If the display on your pump reads "Low Volume" and it is beeping, take the batteries out of the pump and come to the cancer center for it to be taken off.   If the pump alarms go off prior to the pump reading "Low Volume" then call [phone] and someone can assist you.  If the plunger comes out and the chemotherapy medication is leaking out, please use your home chemo spill kit to clean up the spill. Do NOT use paper towels or other household products.  If you have problems or questions regarding your pump, please call either [phone] (24 hours a day) or the cancer center Monday-Friday 8:00 a.m.- 4:30 p.m. at the clinic number and we will assist you. If you are unable to get assistance, then go to the nearest Emergency Department and ask the staff to contact the IV team for assistance.   [facility]  Discharge Instructions: Thank you for choosing [facility] to provide your oncology and hematology care.   If you have a lab appointment with the [facility], please go directly to the [facility] and check in at the registration area.   Wear comfortable clothing and clothing appropriate for easy access to any Portacath or PICC line.   We strive to give you quality time with your provider. You may need to reschedule your appointment if you arrive late (15 or more minutes).  Arriving late affects you and other patients whose appointments are after yours.  Also, if you miss three or more appointments without notifying the office, you may be dismissed from the clinic at the provider's discretion.      For prescription refill  requests, have your pharmacy contact our office and allow 72 hours for refills to be completed.    Today you received the following chemotherapy and/or immunotherapy agents bevacizumab-bvzr, irinotecan, leucovorin, fluorouracil    To help prevent nausea and vomiting after your treatment, we encourage you to take your nausea medication as directed.  BELOW ARE SYMPTOMS THAT SHOULD BE REPORTED IMMEDIATELY: *FEVER GREATER THAN 100.4 F (38 C) OR HIGHER *CHILLS OR SWEATING *NAUSEA AND VOMITING THAT IS NOT CONTROLLED WITH YOUR NAUSEA MEDICATION *UNUSUAL SHORTNESS OF BREATH *UNUSUAL BRUISING OR BLEEDING *URINARY PROBLEMS (pain or burning when urinating, or frequent urination) *BOWEL PROBLEMS (unusual diarrhea, constipation, pain near the anus) TENDERNESS IN MOUTH AND THROAT WITH OR WITHOUT PRESENCE OF ULCERS (sore throat, sores in mouth, or a toothache) UNUSUAL RASH, SWELLING OR PAIN  UNUSUAL VAGINAL DISCHARGE OR ITCHING   Items with * indicate a potential emergency and should be followed up as soon as possible or go to the Emergency Department if any problems should occur.  Please show the CHEMOTHERAPY ALERT CARD or IMMUNOTHERAPY ALERT CARD at check-in to the Emergency Department and triage nurse.  Should you have questions after your visit or need to cancel or reschedule your appointment, please contact [facility]  Dept: [phone]  and follow the prompts.  Office hours are 8:00 a.m. to 4:30 p.m. Monday - Friday. Please note that voicemails left after  4:00 p.m. may not be returned until the following business day.  We are closed weekends and major holidays. You have access to a nurse at all times for urgent questions. Please call the main number to the clinic Dept: [phone] and follow the prompts.   For any non-urgent questions, you may also contact your provider using MyChart. We now offer e-Visits for anyone 41 and older to request care online for non-urgent  symptoms. For details visit [email or web address].   Also download the MyChart app! Go to the app store, search "MyChart", open the app, select [facility], and log in with your MyChart username and password.  Due to Covid, a mask is required upon entering the hospital/clinic. If you do not have a mask, one will be given to you upon arrival. For doctor visits, patients may have 1 support person aged 54 or older with them. For treatment visits, patients cannot have anyone with them due to current Covid guidelines and our immunocompromised population.   Bevacizumab injection What is this medication? BEVACIZUMAB (be va SIZ yoo mab) is a monoclonal antibody. It is used to treatmany types of cancer. This medicine may be used for other purposes; ask your health care provider orpharmacist if you have questions. COMMON BRAND NAME(S): Avastin, MVASI, [name] What should I tell my care team before I take this medication? They need to know if you have any of these conditions: diabetes heart disease high blood pressure history of coughing up blood prior anthracycline chemotherapy (e.g., doxorubicin, daunorubicin, epirubicin) recent or ongoing radiation therapy recent or planning to have surgery stroke an unusual or allergic reaction to bevacizumab, hamster proteins, mouse proteins, other medicines, foods, dyes, or preservatives pregnant or trying to get pregnant breast-feeding How should I use this medication? This medicine is for infusion into a vein. It is given by a health careprofessional in a hospital or clinic setting. Talk to your pediatrician regarding the use of this medicine in children.Special care may be needed. Overdosage: If you think you have taken too much of this medicine contact apoison control center or emergency room at once. NOTE: This medicine is only for you. Do not share this medicine with others. What if I miss a dose? It is important not to miss your dose. Call your  doctor or health careprofessional if you are unable to keep an appointment. What may interact with this medication? Interactions are not expected. This list may not describe all possible interactions. Give your health care provider a list of all the medicines, herbs, non-prescription drugs, or dietary supplements you use. Also tell them if you smoke, drink alcohol, or use illegaldrugs. Some items may interact with your medicine. What should I watch for while using this medication? Your condition will be monitored carefully while you are receiving this medicine. You will need important blood work and urine testing done while youare taking this medicine. This medicine may increase your risk to bruise or bleed. Call your doctor orhealth care professional if you notice any unusual bleeding. Before having surgery, talk to your health care provider to make sure it is ok. This drug can increase the risk of poor healing of your surgical site or wound. You will need to stop this drug for 28 days before surgery. After surgery, wait at least 28 days before restarting this drug. Make sure the surgical site or wound is healed enough before restarting this drug. Talk to your health careprovider if questions. Do not become pregnant while taking this medicine or for  6 months after stopping it. Women should inform their doctor if they wish to become pregnant or think they might be pregnant. There is a potential for serious side effects to an unborn child. Talk to your health care professional or pharmacist for more information. Do not breast-feed an infant while taking this medicine andfor 6 months after the last dose. This medicine has caused ovarian failure in some women. This medicine may interfere with the ability to have a child. You should talk to your doctor orhealth care professional if you are concerned about your fertility. What side effects may I notice from receiving this medication? Side effects that you  should report to your doctor or health care professionalas soon as possible: allergic reactions like skin rash, itching or hives, swelling of the face, lips, or tongue chest pain or chest tightness chills coughing up blood high fever seizures severe constipation signs and symptoms of bleeding such as bloody or black, tarry stools; red or dark-[place]elock urine; spitting up blood or Elbaum material that looks like coffee grounds; red spots on the skin; unusual bruising or bleeding from the eye, gums, or nose signs and symptoms of a blood clot such as breathing problems; chest pain; severe, sudden headache; pain, swelling, warmth in the leg signs and symptoms of a stroke like changes in vision; confusion; trouble speaking or understanding; severe headaches; sudden numbness or weakness of the face, arm or leg; trouble walking; dizziness; loss of balance or coordination stomach pain sweating swelling of legs or ankles vomiting weight gain Side effects that usually do not require medical attention (report to yourdoctor or health care professional if they continue or are bothersome): back pain changes in taste decreased appetite dry skin nausea tiredness This list may not describe all possible side effects. Call your doctor for medical advice about side effects. You may report side effects to FDA at1-800-FDA-1088. Where should I keep my medication? This drug is given in a hospital or clinic and will not be stored at home. NOTE: This sheet is a summary. It may not cover all possible information. If you have questions about this medicine, talk to your doctor, pharmacist, orhealth care provider.  2022 Elsevier/Gold Standard (2019-05-28 10:50:46)  Irinotecan injection What is this medication? IRINOTECAN (ir in oh TEE kan ) is a chemotherapy drug. It is used to treatcolon and rectal cancer. This medicine may be used for other purposes; ask your health care provider orpharmacist if you have  questions. COMMON BRAND NAME(S): Camptosar What should I tell my care team before I take this medication? They need to know if you have any of these conditions: dehydration diarrhea infection (especially a virus infection such as chickenpox, cold sores, or herpes) liver disease low blood counts, like low white cell, platelet, or red cell counts low levels of calcium, magnesium, or potassium in the blood recent or ongoing radiation therapy an unusual or allergic reaction to irinotecan, other medicines, foods, dyes, or preservatives pregnant or trying to get pregnant breast-feeding How should I use this medication? This drug is given as an infusion into a vein. It is administered in a hospitalor clinic by a specially trained health care professional. Talk to your pediatrician regarding the use of this medicine in children.Special care may be needed. Overdosage: If you think you have taken too much of this medicine contact apoison control center or emergency room at once. NOTE: This medicine is only for you. Do not share this medicine with others. What if I miss a dose?  It is important not to miss your dose. Call your doctor or health careprofessional if you are unable to keep an appointment. What may interact with this medication? Do not take this medicine with any of the following medications: cobicistat itraconazole This medicine may interact with the following medications: antiviral medicines for HIV or AIDS certain antibiotics like rifampin or rifabutin certain medicines for fungal infections like ketoconazole, posaconazole, and voriconazole certain medicines for seizures like carbamazepine, phenobarbital, phenotoin clarithromycin gemfibrozil nefazodone [facility]. John's Wort This list may not describe all possible interactions. Give your health care provider a list of all the medicines, herbs, non-prescription drugs, or dietary supplements you use. Also tell them if you smoke, drink  alcohol, or use illegaldrugs. Some items may interact with your medicine. What should I watch for while using this medication? Your condition will be monitored carefully while you are receiving this medicine. You will need important blood work done while you are taking thismedicine. This drug may make you feel generally unwell. This is not uncommon, as chemotherapy can affect healthy cells as well as cancer cells. Report any side effects. Continue your course of treatment even though you feel ill unless yourdoctor tells you to stop. In some cases, you may be given additional medicines to help with side effects.Follow all directions for their use. You may get drowsy or dizzy. Do not drive, use machinery, or do anything that needs mental alertness until you know how this medicine affects you. Do not stand or sit up quickly, especially if you are an older patient. This reducesthe risk of dizzy or fainting spells. Call your health care professional for advice if you get a fever, chills, or sore throat, or other symptoms of a cold or flu. Do not treat yourself. This medicine decreases your body's ability to fight infections. Try to avoid beingaround people who are sick. Avoid taking products that contain aspirin, acetaminophen, ibuprofen, naproxen, or ketoprofen unless instructed by your doctor. These medicines may hide afever. This medicine may increase your risk to bruise or bleed. Call your doctor orhealth care professional if you notice any unusual bleeding. Be careful brushing and flossing your teeth or using a toothpick because you may get an infection or bleed more easily. If you have any dental work done,tell your dentist you are receiving this medicine. Do not become pregnant while taking this medicine or for 6 months after stopping it. Women should inform their health care professional if they wish to become pregnant or think they might be pregnant. Men should not father a child while taking this  medicine and for 3 months after stopping it. There is potential for serious side effects to an unborn child. Talk to your health careprofessional for more information. Do not breast-feed an infant while taking this medicine or for 7 days afterstopping it. This medicine has caused ovarian failure in some women. This medicine may make it more difficult to get pregnant. Talk to your health care professional if [place] concerned about your fertility. This medicine has caused decreased sperm counts in some men. This may make it more difficult to father a child. Talk to your health care professional if [place] concerned about your fertility. What side effects may I notice from receiving this medication? Side effects that you should report to your doctor or health care professionalas soon as possible: allergic reactions like skin rash, itching or hives, swelling of the face, lips, or tongue chest pain diarrhea flushing, runny nose, sweating during infusion low blood counts -  this medicine may decrease the number of white blood cells, red blood cells and platelets. You may be at increased risk for infections and bleeding. nausea, vomiting pain, swelling, warmth in the leg signs of decreased platelets or bleeding - bruising, pinpoint red spots on the skin, black, tarry stools, blood in the urine signs of infection - fever or chills, cough, sore throat, pain or difficulty passing urine signs of decreased red blood cells - unusually weak or tired, fainting spells, lightheadedness Side effects that usually do not require medical attention (report to yourdoctor or health care professional if they continue or are bothersome): constipation hair loss headache loss of appetite mouth sores stomach pain This list may not describe all possible side effects. Call your doctor for medical advice about side effects. You may report side effects to FDA at1-800-FDA-1088. Where should I keep my medication? This drug is  given in a hospital or clinic and will not be stored at home. NOTE: This sheet is a summary. It may not cover all possible information. If you have questions about this medicine, talk to your doctor, pharmacist, orhealth care provider.  2022 Elsevier/Gold Standard (2019-07-01 17:46:13) Leucovorin injection What is this medication? LEUCOVORIN (loo koe VOR in) is used to prevent or treat the harmful effects of some medicines. This medicine is used to treat anemia caused by a low amount of folic acid in the body. It is also used with 5-fluorouracil (5-FU) to treatcolon cancer. This medicine may be used for other purposes; ask your health care provider orpharmacist if you have questions. What should I tell my care team before I take this medication? They need to know if you have any of these conditions: anemia from low levels of vitamin B-12 in the blood an unusual or allergic reaction to leucovorin, folic acid, other medicines, foods, dyes, or preservatives pregnant or trying to get pregnant breast-feeding How should I use this medication? This medicine is for injection into a muscle or into a vein. It is given by ahealth care professional in a hospital or clinic setting. Talk to your pediatrician regarding the use of this medicine in children.Special care may be needed. Overdosage: If you think you have taken too much of this medicine contact apoison control center or emergency room at once. NOTE: This medicine is only for you. Do not share this medicine with others. What if I miss a dose? This does not apply. What may interact with this medication? capecitabine fluorouracil phenobarbital phenytoin primidone trimethoprim-sulfamethoxazole This list may not describe all possible interactions. Give your health care provider a list of all the medicines, herbs, non-prescription drugs, or dietary supplements you use. Also tell them if you smoke, drink alcohol, or use illegaldrugs. Some items may  interact with your medicine. What should I watch for while using this medication? Your condition will be monitored carefully while you are receiving thismedicine. This medicine may increase the side effects of 5-fluorouracil, 5-FU. Tell your doctor or health care professional if you have diarrhea or mouth sores that donot get better or that get worse. What side effects may I notice from receiving this medication? Side effects that you should report to your doctor or health care professionalas soon as possible: allergic reactions like skin rash, itching or hives, swelling of the face, lips, or tongue breathing problems fever, infection mouth sores unusual bleeding or bruising unusually weak or tired Side effects that usually do not require medical attention (report to yourdoctor or health care professional if they continue or  are bothersome): constipation or diarrhea loss of appetite nausea, vomiting This list may not describe all possible side effects. Call your doctor for medical advice about side effects. You may report side effects to FDA at1-800-FDA-1088. Where should I keep my medication? This drug is given in a hospital or clinic and will not be stored at home. NOTE: This sheet is a summary. It may not cover all possible information. If you have questions about this medicine, talk to your doctor, pharmacist, orhealth care provider.  2022 Elsevier/Gold Standard (2008-02-04 16:50:29)  Fluorouracil, 5-FU injection What is this medication? FLUOROURACIL, 5-FU (flure oh YOOR a sil) is a chemotherapy drug. It slows the growth of cancer cells. This medicine is used to treat many types of cancer like breast cancer, colon or rectal cancer, pancreatic cancer, and stomachcancer. This medicine may be used for other purposes; ask your health care provider orpharmacist if you have questions. COMMON BRAND NAME(S): Adrucil What should I tell my care team before I take this medication? They need to  know if you have any of these conditions: blood disorders dihydropyrimidine dehydrogenase (DPD) deficiency infection (especially a virus infection such as chickenpox, cold sores, or herpes) kidney disease liver disease malnourished, poor nutrition recent or ongoing radiation therapy an unusual or allergic reaction to fluorouracil, other chemotherapy, other medicines, foods, dyes, or preservatives pregnant or trying to get pregnant breast-feeding How should I use this medication? This drug is given as an infusion or injection into a vein. It is administeredin a hospital or clinic by a specially trained health care professional. Talk to your pediatrician regarding the use of this medicine in children.Special care may be needed. Overdosage: If you think you have taken too much of this medicine contact apoison control center or emergency room at once. NOTE: This medicine is only for you. Do not share this medicine with others. What if I miss a dose? It is important not to miss your dose. Call your doctor or health careprofessional if you are unable to keep an appointment. What may interact with this medication? Do not take this medicine with any of the following medications: live virus vaccines This medicine may also interact with the following medications: medicines that treat or prevent blood clots like warfarin, enoxaparin, and dalteparin This list may not describe all possible interactions. Give your health care provider a list of all the medicines, herbs, non-prescription drugs, or dietary supplements you use. Also tell them if you smoke, drink alcohol, or use illegaldrugs. Some items may interact with your medicine. What should I watch for while using this medication? Visit your doctor for checks on your progress. This drug may make you feel generally unwell. This is not uncommon, as chemotherapy can affect healthy cells as well as cancer cells. Report any side effects. Continue your  course oftreatment even though you feel ill unless your doctor tells you to stop. In some cases, you may be given additional medicines to help with side effects.Follow all directions for their use. Call your doctor or health care professional for advice if you get a fever, chills or sore throat, or other symptoms of a cold or flu. Do not treat yourself. This drug decreases your body's ability to fight infections. Try toavoid being around people who are sick. This medicine may increase your risk to bruise or bleed. Call your doctor orhealth care professional if you notice any unusual bleeding. Be careful brushing and flossing your teeth or using a toothpick because you may get an infection  or bleed more easily. If you have any dental work done,tell your dentist you are receiving this medicine. Avoid taking products that contain aspirin, acetaminophen, ibuprofen, naproxen, or ketoprofen unless instructed by your doctor. These medicines may hide afever. Do not become pregnant while taking this medicine. Women should inform their doctor if they wish to become pregnant or think they might be pregnant. There is a potential for serious side effects to an unborn child. Talk to your health care professional or pharmacist for more information. Do not breast-feed aninfant while taking this medicine. Men should inform their doctor if they wish to father a child. This medicinemay lower sperm counts. Do not treat diarrhea with over the counter products. Contact your doctor ifyou have diarrhea that lasts more than 2 days or if it is severe and watery. This medicine can make you more sensitive to the sun. Keep out of the sun. If you cannot avoid being in the sun, wear protective clothing and use sunscreen.Do not use sun lamps or tanning beds/booths. What side effects may I notice from receiving this medication? Side effects that you should report to your doctor or health care professionalas soon as possible: allergic  reactions like skin rash, itching or hives, swelling of the face, lips, or tongue low blood counts - this medicine may decrease the number of white blood cells, red blood cells and platelets. You may be at increased risk for infections and bleeding. signs of infection - fever or chills, cough, sore throat, pain or difficulty passing urine signs of decreased platelets or bleeding - bruising, pinpoint red spots on the skin, black, tarry stools, blood in the urine signs of decreased red blood cells - unusually weak or tired, fainting spells, lightheadedness breathing problems changes in vision chest pain mouth sores nausea and vomiting pain, swelling, redness at site where injected pain, tingling, numbness in the hands or feet redness, swelling, or sores on hands or feet stomach pain unusual bleeding Side effects that usually do not require medical attention (report to yourdoctor or health care professional if they continue or are bothersome): changes in finger or toe nails diarrhea dry or itchy skin hair loss headache loss of appetite sensitivity of eyes to the light stomach upset unusually teary eyes This list may not describe all possible side effects. Call your doctor for medical advice about side effects. You may report side effects to FDA at1-800-FDA-1088. Where should I keep my medication? This drug is given in a hospital or clinic and will not be stored at home. NOTE: This sheet is a summary. It may not cover all possible information. If you have questions about this medicine, talk to your doctor, pharmacist, orhealth care provider.  2022 [employer]vier/Gold Standard (2019-07-01 15:00:03)

## 2021-03-24 NOTE — Progress Notes (Signed)
[place] OFFICE PROGRESS NOTE   Diagnosis: Colon cancer  INTERVAL HISTORY:   Stacy Burns returns as scheduled.  She completed cycle 20 FOLFIRI/Avastin 03/03/2021.  She denies nausea/vomiting.  No mouth sores.  No change in baseline loose stools, estimates 2 per day.  She denies bleeding.  No leg swelling or calf pain.  No shortness of breath or chest pain.  Objective:  Vital signs in last 24 hours:  Blood pressure (!) 148/61, pulse 62, temperature 97.8 F (36.6 C), temperature source Oral, resp. rate 18, height _0  (1.702 m), weight 127 lb 6.4 oz (57.8 kg), SpO2 100 %.    HEENT: No thrush or ulcers. Resp: Lungs clear bilaterally. Cardio: Regular rate and rhythm. GI: Abdomen soft and nontender.  No hepatomegaly.  No mass. Vascular: No leg edema. Skin: Small ecchymoses scattered over forearms. Port-A-Cath without erythema.  Lab Results:  Lab Results  Component Value Date   WBC 8.0 03/24/2021   HGB 10.0 (L) 03/24/2021   HCT 31.9 (L) 03/24/2021   MCV 100.6 (H) 03/24/2021   PLT 120 (L) 03/24/2021   NEUTROABS 5.6 03/24/2021    Imaging:  No results found.  Medications: I have reviewed the patient's current medications.  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  Cirrhosis.  Innumerable liver lesions. Colonoscopy 12/18/2019-ulcerated partially obstructing large mass in the mid ascending colon.  Biopsy invasive adenocarcinoma; preserved expression major MMR proteins Upper endoscopy 12/18/2019-normal esophagus, stomach, examined duodenum CEA 12/19/2019-8,923 Biopsy liver lesion 12/23/2019-adenocarcinoma Foundation 1-K-ras wild-type; tumor mutation burden greater than or equal to 10; microsatellite stable; BRAF V600E Cycle 1 FOLFOX 01/01/2020 Cycle 2 FOLFOX 01/15/2020  Cycle 3 FOLFOX 01/29/2020, Udenyca added Cycle 4 FOLFOX 02/12/2020, Udenyca  held Cycle 5 FOLFOX 02/26/2020, Udenyca Cycle 6 FOLFOX 03/11/2020, Udenyca held CT abdomen/pelvis 03/16/2020-stable diffuse liver lesions, stable strictured appearing ascending colon, possible pericolonic implant, vague left lower lobe nodule Cycle 1 FOLFIRI/Avastin 04/06/2020 Cycle 2 FOLFIRI/Avastin 04/21/2020 Cycle 3 FOLFIRI/Avastin 05/06/2020 Cycle 4 FOLFIRI/Avastin 05/20/2020 Cycle 5 FOLFIRI/Avastin 06/03/2020 CTs 06/15/2020-mild to moderate improvement in hepatic metastasis.  No new or progressive disease.  Narrowing within the proximal ascending colon with upstream mild cecal and terminal ileum dilatation, similar. Cycle 6 FOLFIRI/Avastin 06/17/2020 Cycle 7 FOLFIRI/Avastin 07/01/2020 Cycle 8 FOLFIRI/Avastin 07/15/2020 Cycle 9 FOLFIRI/Avastin 07/29/2020-5-FU bolus eliminated, 5-FU infusion and irinotecan dose reduced Cycle 10 FOLFIRI/Avastin 08/12/2020 CTs 08/23/2020- mild residual wall thickening involving the cecum/ascending colon.  Numerous liver metastases improved. Cycle 11 FOLFIRI/Avastin 08/25/2020 Cycle 12 FOLFIRI/Avastin 09/16/2020 Cycle 13 FOLFIRI/Avastin 10/07/2020 Cycle 14 FOLFIRI/Avastin 10/28/2020 Cycle 15 FOLFIRI/Avastin 11/18/2020 CT abdomen/pelvis 12/06/2020-mild improvement in multifocal hepatic metastases, wall thickening at the ascending colon with pericolonic inflammatory changes Cycle 16 FOLFIRI/Avastin 12/09/2020 Cycle 17 FOLFIRI/Avastin 12/30/2020 Cycle 18 FOLFIRI/Avastin 01/20/2021 Cycle 19 FOLFIRI/Avastin 02/10/2021 Cycle 20 FOLFIRI/Avastin 03/03/2021 Cycle 21 FOLFIRI/Avastin 03/24/2021 Anemia secondary to GI blood loss, on oral iron BID  Cirrhosis 12/26/2019-hospital admission for right lower extremity DVT and GI bleed, treated with heparin anticoagulation beginning 12/26/2019, converted to Lovenox at discharge 12/29/2019 Lovenox reduced to 40 mg daily 02/10/2021 secondary to bruising and nosebleeding History of low-grade fever-likely "tumor" fever COVID-19 infection January 2021     Disposition: Stacy Burns appears stable.  She has completed 20 cycles of FOLFIRI/Avastin.  There is no clinical evidence of disease progression.  Most recent CEA was lower.  Plan to proceed with cycle 21 today as scheduled.  Plan for restaging CTs after cycle  22.  We reviewed the CBC, chemistry panel and urine protein from today.  Labs adequate to proceed with treatment.  She has stable mild thrombocytopenia.  Urine is negative for protein.  She will return for lab, follow-up, FOLFIRI/Avastin in 3 weeks.  She will contact the office in the interim with any problems.    Ned Card ANP/GNP-BC   03/24/2021  9:17 AM

## 2021-03-24 NOTE — Patient Instructions (Signed)
Implanted Port Home Guide An implanted port is a device that is placed under the skin. It is usually placed in the chest. The device can be used to give IV medicine, to take blood, or for dialysis. You may have an implanted port if: You need IV medicine that would be irritating to the small veins in your hands or arms. You need IV medicines, such as antibiotics, for a long period of time. You need IV nutrition for a long period of time. You need dialysis. When you have a port, your health care provider can choose to use the port instead of veins in your arms for these procedures. You may have fewer limitations when using a port than you would if you used other types of long-term IVs, and you will likely be able to return to normal activities afteryour incision heals. An implanted port has two main parts: Reservoir. The reservoir is the part where a needle is inserted to give medicines or draw blood. The reservoir is round. After it is placed, it appears as a small, raised area under your skin. Catheter. The catheter is a thin, flexible tube that connects the reservoir to a vein. Medicine that is inserted into the reservoir goes into the catheter and then into the vein. How is my port accessed? To access your port: A numbing cream may be placed on the skin over the port site. Your health care provider will put on a mask and sterile gloves. The skin over your port will be cleaned carefully with a germ-killing soap and allowed to dry. Your health care provider will gently pinch the port and insert a needle into it. Your health care provider will check for a blood return to make sure the port is in the vein and is not clogged. If your port needs to remain accessed to get medicine continuously (constant infusion), your health care provider will place a clear bandage (dressing) over the needle site. The dressing and needle will need to be changed every week, or as told by your health care provider. What  is flushing? Flushing helps keep the port from getting clogged. Follow instructions from your health care provider about how and when to flush the port. Ports are usually flushed with saline solution or a medicine called heparin. The need for flushing will depend on how the port is used: If the port is only used from time to time to give medicines or draw blood, the port may need to be flushed: Before and after medicines have been given. Before and after blood has been drawn. As part of routine maintenance. Flushing may be recommended every 4-6 weeks. If a constant infusion is running, the port may not need to be flushed. Throw away any syringes in a disposal container that is meant for sharp items (sharps container). You can buy a sharps container from a pharmacy, or you can make one by using an empty hard plastic bottle with a cover. How long will my port stay implanted? The port can stay in for as long as your health care provider thinks it is needed. When it is time for the port to come out, a surgery will be done to remove it. The surgery will be similar to the procedure that was done to putthe port in. Follow these instructions at home:  Flush your port as told by your health care provider. If you need an infusion over several days, follow instructions from your health care provider about how to take   care of your port site. Make sure you: Wash your hands with soap and water before you change your dressing. If soap and water are not available, use alcohol-based hand sanitizer. Change your dressing as told by your health care provider. Place any used dressings or infusion bags into a plastic bag. Throw that bag in the trash. Keep the dressing that covers the needle clean and dry. Do not get it wet. Do not use scissors or sharp objects near the tube. Keep the tube clamped, unless it is being used. Check your port site every day for signs of infection. Check for: Redness, swelling, or  pain. Fluid or blood. Pus or a bad smell. Protect the skin around the port site. Avoid wearing bra straps that rub or irritate the site. Protect the skin around your port from seat belts. Place a soft pad over your chest if needed. Bathe or shower as told by your health care provider. The site may get wet as long as you are not actively receiving an infusion. Return to your normal activities as told by your health care provider. Ask your health care provider what activities are safe for you. Carry a medical alert card or wear a medical alert bracelet at all times. This will let health care providers know that you have an implanted port in case of an emergency. Get help right away if: You have redness, swelling, or pain at the port site. You have fluid or blood coming from your port site. You have pus or a bad smell coming from the port site. You have a fever. Summary Implanted ports are usually placed in the chest for long-term IV access. Follow instructions from your health care provider about flushing the port and changing bandages (dressings). Take care of the area around your port by avoiding clothing that puts pressure on the area, and by watching for signs of infection. Protect the skin around your port from seat belts. Place a soft pad over your chest if needed. Get help right away if you have a fever or you have redness, swelling, pain, drainage, or a bad smell at the port site. This information is not intended to replace advice given to you by your health care provider. Make sure you discuss any questions you have with your healthcare provider. Document Revised: 12/15/2019 Document Reviewed: 12/15/2019 Elsevier Patient Education  2022 [employer]  

## 2021-03-26 ENCOUNTER — Inpatient Hospital Stay: Payer: BC Managed Care – PPO

## 2021-03-26 ENCOUNTER — Other Ambulatory Visit: Payer: Self-pay

## 2021-03-26 VITALS — BP 133/58 | HR 67 | Temp 97.9°F | Resp 18

## 2021-03-26 DIAGNOSIS — C787 Secondary malignant neoplasm of liver and intrahepatic bile duct: Secondary | ICD-10-CM | POA: Diagnosis not present

## 2021-03-26 DIAGNOSIS — Z86718 Personal history of other venous thrombosis and embolism: Secondary | ICD-10-CM | POA: Diagnosis not present

## 2021-03-26 DIAGNOSIS — Z5189 Encounter for other specified aftercare: Secondary | ICD-10-CM | POA: Diagnosis not present

## 2021-03-26 DIAGNOSIS — C182 Malignant neoplasm of ascending colon: Secondary | ICD-10-CM

## 2021-03-26 DIAGNOSIS — Z5112 Encounter for antineoplastic immunotherapy: Secondary | ICD-10-CM | POA: Diagnosis not present

## 2021-03-26 DIAGNOSIS — Z5111 Encounter for antineoplastic chemotherapy: Secondary | ICD-10-CM | POA: Diagnosis not present

## 2021-03-26 DIAGNOSIS — Z452 Encounter for adjustment and management of vascular access device: Secondary | ICD-10-CM | POA: Diagnosis not present

## 2021-03-26 DIAGNOSIS — D500 Iron deficiency anemia secondary to blood loss (chronic): Secondary | ICD-10-CM | POA: Diagnosis not present

## 2021-03-26 DIAGNOSIS — Z7901 Long term (current) use of anticoagulants: Secondary | ICD-10-CM | POA: Diagnosis not present

## 2021-03-26 DIAGNOSIS — D696 Thrombocytopenia, unspecified: Secondary | ICD-10-CM | POA: Diagnosis not present

## 2021-03-26 MED ORDER — PEGFILGRASTIM-CBQV 6 MG/0.6ML ~~LOC~~ SOSY
6.0000 mg | PREFILLED_SYRINGE | Freq: Once | SUBCUTANEOUS | Status: AC
  Administered 2021-03-26: 6 mg via SUBCUTANEOUS

## 2021-03-26 MED ORDER — HEPARIN SOD (PORK) LOCK FLUSH 100 UNIT/ML IV SOLN
500.0000 [IU] | Freq: Once | INTRAVENOUS | Status: AC | PRN
  Administered 2021-03-26: 500 [IU]
  Filled 2021-03-26: qty 5

## 2021-03-26 MED ORDER — SODIUM CHLORIDE 0.9% FLUSH
10.0000 mL | INTRAVENOUS | Status: DC | PRN
  Administered 2021-03-26: 10 mL
  Filled 2021-03-26: qty 10

## 2021-03-26 NOTE — Patient Instructions (Signed)

## 2021-04-02 DIAGNOSIS — C182 Malignant neoplasm of ascending colon: Secondary | ICD-10-CM | POA: Diagnosis not present

## 2021-04-09 ENCOUNTER — Other Ambulatory Visit: Payer: Self-pay | Admitting: Oncology

## 2021-04-14 ENCOUNTER — Inpatient Hospital Stay: Payer: BC Managed Care – PPO

## 2021-04-14 ENCOUNTER — Other Ambulatory Visit: Payer: Self-pay

## 2021-04-14 ENCOUNTER — Inpatient Hospital Stay: Payer: BC Managed Care – PPO | Admitting: Oncology

## 2021-04-14 ENCOUNTER — Inpatient Hospital Stay: Payer: BC Managed Care – PPO | Attending: Oncology

## 2021-04-14 VITALS — BP 139/67 | HR 69

## 2021-04-14 VITALS — BP 139/58 | HR 66 | Temp 97.8°F | Resp 20 | Ht 67.0 in | Wt 127.8 lb

## 2021-04-14 DIAGNOSIS — Z5189 Encounter for other specified aftercare: Secondary | ICD-10-CM | POA: Insufficient documentation

## 2021-04-14 DIAGNOSIS — Z7901 Long term (current) use of anticoagulants: Secondary | ICD-10-CM | POA: Insufficient documentation

## 2021-04-14 DIAGNOSIS — Z5111 Encounter for antineoplastic chemotherapy: Secondary | ICD-10-CM | POA: Insufficient documentation

## 2021-04-14 DIAGNOSIS — Z5112 Encounter for antineoplastic immunotherapy: Secondary | ICD-10-CM | POA: Diagnosis not present

## 2021-04-14 DIAGNOSIS — C787 Secondary malignant neoplasm of liver and intrahepatic bile duct: Secondary | ICD-10-CM | POA: Diagnosis not present

## 2021-04-14 DIAGNOSIS — Z452 Encounter for adjustment and management of vascular access device: Secondary | ICD-10-CM | POA: Diagnosis not present

## 2021-04-14 DIAGNOSIS — C182 Malignant neoplasm of ascending colon: Secondary | ICD-10-CM

## 2021-04-14 DIAGNOSIS — K7460 Unspecified cirrhosis of liver: Secondary | ICD-10-CM | POA: Diagnosis not present

## 2021-04-14 DIAGNOSIS — D649 Anemia, unspecified: Secondary | ICD-10-CM | POA: Diagnosis not present

## 2021-04-14 DIAGNOSIS — D500 Iron deficiency anemia secondary to blood loss (chronic): Secondary | ICD-10-CM | POA: Insufficient documentation

## 2021-04-14 DIAGNOSIS — Z86718 Personal history of other venous thrombosis and embolism: Secondary | ICD-10-CM | POA: Diagnosis not present

## 2021-04-14 LAB — CBC WITH DIFFERENTIAL (CANCER CENTER ONLY)
Abs Immature Granulocytes: 0.03 10*3/uL (ref 0.00–0.07)
Basophils Absolute: 0.1 10*3/uL (ref 0.0–0.1)
Basophils Relative: 1 %
Eosinophils Absolute: 0.1 10*3/uL (ref 0.0–0.5)
Eosinophils Relative: 2 %
HCT: 30.7 % — ABNORMAL LOW (ref 36.0–46.0)
Hemoglobin: 9.6 g/dL — ABNORMAL LOW (ref 12.0–15.0)
Immature Granulocytes: 0 %
Lymphocytes Relative: 14 %
Lymphs Abs: 1 10*3/uL (ref 0.7–4.0)
MCH: 31.6 pg (ref 26.0–34.0)
MCHC: 31.3 g/dL (ref 30.0–36.0)
MCV: 101 fL — ABNORMAL HIGH (ref 80.0–100.0)
Monocytes Absolute: 0.7 10*3/uL (ref 0.1–1.0)
Monocytes Relative: 10 %
Neutro Abs: 5.3 10*3/uL (ref 1.7–7.7)
Neutrophils Relative %: 73 %
Platelet Count: 120 10*3/uL — ABNORMAL LOW (ref 150–400)
RBC: 3.04 MIL/uL — ABNORMAL LOW (ref 3.87–5.11)
RDW: 15.6 % — ABNORMAL HIGH (ref 11.5–15.5)
WBC Count: 7.3 10*3/uL (ref 4.0–10.5)
nRBC: 0 % (ref 0.0–0.2)

## 2021-04-14 LAB — TOTAL PROTEIN, URINE DIPSTICK: Protein, ur: NEGATIVE mg/dL

## 2021-04-14 LAB — CMP (CANCER CENTER ONLY)
ALT: 45 U/L — ABNORMAL HIGH (ref 0–44)
AST: 53 U/L — ABNORMAL HIGH (ref 15–41)
Albumin: 3.2 g/dL — ABNORMAL LOW (ref 3.5–5.0)
Alkaline Phosphatase: 215 U/L — ABNORMAL HIGH (ref 38–126)
Anion gap: 6 (ref 5–15)
BUN: 26 mg/dL — ABNORMAL HIGH (ref 8–23)
CO2: 26 mmol/L (ref 22–32)
Calcium: 8.7 mg/dL — ABNORMAL LOW (ref 8.9–10.3)
Chloride: 108 mmol/L (ref 98–111)
Creatinine: 0.62 mg/dL (ref 0.44–1.00)
GFR, Estimated: >60 mL/min (ref >60)
Glucose, Bld: 83 mg/dL (ref 70–99)
Potassium: 4.2 mmol/L (ref 3.5–5.1)
Sodium: 140 mmol/L (ref 135–145)
Total Bilirubin: 0.3 mg/dL (ref 0.3–1.2)
Total Protein: 5.8 g/dL — ABNORMAL LOW (ref 6.5–8.1)

## 2021-04-14 LAB — CEA (ACCESS): CEA (CHCC): 23 ng/mL — ABNORMAL HIGH (ref 0.00–5.00)

## 2021-04-14 MED ORDER — SODIUM CHLORIDE 0.9 % IV SOLN: Freq: Once | INTRAVENOUS | Status: AC

## 2021-04-14 MED ORDER — SODIUM CHLORIDE 0.9 % IV SOLN
200.0000 mg/m2 | Freq: Once | INTRAVENOUS | Status: AC
  Administered 2021-04-14: 318 mg via INTRAVENOUS
  Filled 2021-04-14: qty 15.9

## 2021-04-14 MED ORDER — SODIUM CHLORIDE 0.9 % IV SOLN
2500.0000 mg | INTRAVENOUS | Status: DC
  Administered 2021-04-14: 2500 mg via INTRAVENOUS
  Filled 2021-04-14: qty 50

## 2021-04-14 MED ORDER — OXYCODONE-ACETAMINOPHEN 5-325 MG PO TABS: 1.0000 | ORAL_TABLET | Freq: Three times a day (TID) | ORAL | 0 refills | Status: DC | PRN

## 2021-04-14 MED ORDER — SODIUM CHLORIDE 0.9 % IV SOLN
300.0000 mg | Freq: Once | INTRAVENOUS | Status: AC
  Administered 2021-04-14: 300 mg via INTRAVENOUS
  Filled 2021-04-14: qty 12

## 2021-04-14 MED ORDER — SODIUM CHLORIDE 0.9 % IV SOLN
10.0000 mg | Freq: Once | INTRAVENOUS | Status: AC
  Administered 2021-04-14: 10 mg via INTRAVENOUS
  Filled 2021-04-14: qty 1

## 2021-04-14 MED ORDER — ATROPINE SULFATE 1 MG/ML IJ SOLN
0.5000 mg | Freq: Once | INTRAMUSCULAR | Status: AC | PRN
  Administered 2021-04-14: 0.5 mg via INTRAVENOUS
  Filled 2021-04-14: qty 1

## 2021-04-14 MED ORDER — PALONOSETRON HCL INJECTION 0.25 MG/5ML
0.2500 mg | Freq: Once | INTRAVENOUS | Status: AC
  Administered 2021-04-14: 0.25 mg via INTRAVENOUS
  Filled 2021-04-14: qty 5

## 2021-04-14 MED ORDER — SODIUM CHLORIDE 0.9 % IV SOLN
140.0000 mg/m2 | Freq: Once | INTRAVENOUS | Status: AC
  Administered 2021-04-14: 220 mg via INTRAVENOUS
  Filled 2021-04-14: qty 11

## 2021-04-14 NOTE — Patient Instructions (Addendum)
The chemotherapy medication bag should finish at 46 hours, 96 hours, or 7 days. For example, if your pump is scheduled for 46 hours and it was put on at 4:00 p.m., it should finish at 2:00 p.m. the day it is scheduled to come off regardless of your appointment time.     Estimated time to finish at 11:30 Saturday    If the display on your pump reads "Low Volume" and it is beeping, take the batteries out of the pump and come to the cancer center for it to be taken off.   If the pump alarms go off prior to the pump reading "Low Volume" then call [phone] and someone can assist you.  If the plunger comes out and the chemotherapy medication is leaking out, please use your home chemo spill kit to clean up the spill. Do NOT use paper towels or other household products.  If you have problems or questions regarding your pump, please call either [phone] (24 hours a day) or the cancer center Monday-Friday 8:00 a.m.- 4:30 p.m. at the clinic number and we will assist you. If you are unable to get assistance, then go to the nearest Emergency Department and ask the staff to contact the IV team for assistance.   [facility]  Discharge Instructions: Thank you for choosing [facility] to provide your oncology and hematology care.   If you have a lab appointment with the [facility], please go directly to the [facility] and check in at the registration area.   Wear comfortable clothing and clothing appropriate for easy access to any Portacath or PICC line.   We strive to give you quality time with your provider. You may need to reschedule your appointment if you arrive late (15 or more minutes).  Arriving late affects you and other patients whose appointments are after yours.  Also, if you miss three or more appointments without notifying the office, you may be dismissed from the clinic at the provider's discretion.      For prescription refill requests,  have your pharmacy contact our office and allow 72 hours for refills to be completed.    Today you received the following chemotherapy and/or immunotherapy agents bevacizumab-bvzr, irinotecan, fluorouracil   To help prevent nausea and vomiting after your treatment, we encourage you to take your nausea medication as directed.  BELOW ARE SYMPTOMS THAT SHOULD BE REPORTED IMMEDIATELY: *FEVER GREATER THAN 100.4 F (38 C) OR HIGHER *CHILLS OR SWEATING *NAUSEA AND VOMITING THAT IS NOT CONTROLLED WITH YOUR NAUSEA MEDICATION *UNUSUAL SHORTNESS OF BREATH *UNUSUAL BRUISING OR BLEEDING *URINARY PROBLEMS (pain or burning when urinating, or frequent urination) *BOWEL PROBLEMS (unusual diarrhea, constipation, pain near the anus) TENDERNESS IN MOUTH AND THROAT WITH OR WITHOUT PRESENCE OF ULCERS (sore throat, sores in mouth, or a toothache) UNUSUAL RASH, SWELLING OR PAIN  UNUSUAL VAGINAL DISCHARGE OR ITCHING   Items with * indicate a potential emergency and should be followed up as soon as possible or go to the Emergency Department if any problems should occur.  Please show the CHEMOTHERAPY ALERT CARD or IMMUNOTHERAPY ALERT CARD at check-in to the Emergency Department and triage nurse.  Should you have questions after your visit or need to cancel or reschedule your appointment, please contact [facility]  Dept: [phone]  and follow the prompts.  Office hours are 8:00 a.m. to 4:30 p.m. Monday - Friday. Please note that voicemails left after 4:00 p.m. may  not be returned until the following business day.  We are closed weekends and major holidays. You have access to a nurse at all times for urgent questions. Please call the main number to the clinic Dept: [phone] and follow the prompts.   For any non-urgent questions, you may also contact your provider using MyChart. We now offer e-Visits for anyone 90 and older to request care online for non-urgent symptoms. For details  visit [email or web address].   Also download the MyChart app! Go to the app store, search "MyChart", open the app, select [facility], and log in with your MyChart username and password.  Due to Covid, a mask is required upon entering the hospital/clinic. If you do not have a mask, one will be given to you upon arrival. For doctor visits, patients may have 1 support person aged 14 or older with them. For treatment visits, patients cannot have anyone with them due to current Covid guidelines and our immunocompromised population.   Bevacizumab injection What is this medication? BEVACIZUMAB (be va SIZ yoo mab) is a monoclonal antibody. It is used to treat many types of cancer. This medicine may be used for other purposes; ask your health care provider or pharmacist if you have questions. COMMON BRAND NAME(S): Avastin, MVASI, [name] What should I tell my care team before I take this medication? They need to know if you have any of these conditions: diabetes heart disease high blood pressure history of coughing up blood prior anthracycline chemotherapy (e.g., doxorubicin, daunorubicin, epirubicin) recent or ongoing radiation therapy recent or planning to have surgery stroke an unusual or allergic reaction to bevacizumab, hamster proteins, mouse proteins, other medicines, foods, dyes, or preservatives pregnant or trying to get pregnant breast-feeding How should I use this medication? This medicine is for infusion into a vein. It is given by a health care professional in a hospital or clinic setting. Talk to your pediatrician regarding the use of this medicine in children. Special care may be needed. Overdosage: If you think you have taken too much of this medicine contact a poison control center or emergency room at once. NOTE: This medicine is only for you. Do not share this medicine with others. What if I miss a dose? It is important not to miss your dose. Call your doctor or health care  professional if you are unable to keep an appointment. What may interact with this medication? Interactions are not expected. This list may not describe all possible interactions. Give your health care provider a list of all the medicines, herbs, non-prescription drugs, or dietary supplements you use. Also tell them if you smoke, drink alcohol, or use illegal drugs. Some items may interact with your medicine. What should I watch for while using this medication? Your condition will be monitored carefully while you are receiving this medicine. You will need important blood work and urine testing done while you are taking this medicine. This medicine may increase your risk to bruise or bleed. Call your doctor or health care professional if you notice any unusual bleeding. Before having surgery, talk to your health care provider to make sure it is ok. This drug can increase the risk of poor healing of your surgical site or wound. You will need to stop this drug for 28 days before surgery. After surgery, wait at least 28 days before restarting this drug. Make sure the surgical site or wound is healed enough before restarting this drug. Talk to your health care provider if questions. Do not become  pregnant while taking this medicine or for 6 months after stopping it. Women should inform their doctor if they wish to become pregnant or think they might be pregnant. There is a potential for serious side effects to an unborn child. Talk to your health care professional or pharmacist for more information. Do not breast-feed an infant while taking this medicine and for 6 months after the last dose. This medicine has caused ovarian failure in some women. This medicine may interfere with the ability to have a child. You should talk to your doctor or health care professional if you are concerned about your fertility. What side effects may I notice from receiving this medication? Side effects that you should report to  your doctor or health care professional as soon as possible: allergic reactions like skin rash, itching or hives, swelling of the face, lips, or tongue chest pain or chest tightness chills coughing up blood high fever seizures severe constipation signs and symptoms of bleeding such as bloody or black, tarry stools; red or dark-Suarez urine; spitting up blood or Mcdanel material that looks like coffee grounds; red spots on the skin; unusual bruising or bleeding from the eye, gums, or nose signs and symptoms of a blood clot such as breathing problems; chest pain; severe, sudden headache; pain, swelling, warmth in the leg signs and symptoms of a stroke like changes in vision; confusion; trouble speaking or understanding; severe headaches; sudden numbness or weakness of the face, arm or leg; trouble walking; dizziness; loss of balance or coordination stomach pain sweating swelling of legs or ankles vomiting weight gain Side effects that usually do not require medical attention (report to your doctor or health care professional if they continue or are bothersome): back pain changes in taste decreased appetite dry skin nausea tiredness This list may not describe all possible side effects. Call your doctor for medical advice about side effects. You may report side effects to FDA at 1-800-FDA-1088. Where should I keep my medication? This drug is given in a hospital or clinic and will not be stored at home. NOTE: This sheet is a summary. It may not cover all possible information. If you have questions about this medicine, talk to your doctor, pharmacist, or health care provider.  2022 Elsevier/Gold Standard (2019-05-28 10:50:46)  Irinotecan injection What is this medication? IRINOTECAN (ir in oh TEE kan ) is a chemotherapy drug. It is used to treat colon and rectal cancer. This medicine may be used for other purposes; ask your health care provider or pharmacist if you have questions. COMMON  BRAND NAME(S): Camptosar What should I tell my care team before I take this medication? They need to know if you have any of these conditions: dehydration diarrhea infection (especially a virus infection such as chickenpox, cold sores, or herpes) liver disease low blood counts, like low white cell, platelet, or red cell counts low levels of calcium, magnesium, or potassium in the blood recent or ongoing radiation therapy an unusual or allergic reaction to irinotecan, other medicines, foods, dyes, or preservatives pregnant or trying to get pregnant breast-feeding How should I use this medication? This drug is given as an infusion into a vein. It is administered in a hospital or clinic by a specially trained health care professional. Talk to your pediatrician regarding the use of this medicine in children. Special care may be needed. Overdosage: If you think you have taken too much of this medicine contact a poison control center or emergency room at once. NOTE: This  medicine is only for you. Do not share this medicine with others. What if I miss a dose? It is important not to miss your dose. Call your doctor or health care professional if you are unable to keep an appointment. What may interact with this medication? Do not take this medicine with any of the following medications: cobicistat itraconazole This medicine may interact with the following medications: antiviral medicines for HIV or AIDS certain antibiotics like rifampin or rifabutin certain medicines for fungal infections like ketoconazole, posaconazole, and voriconazole certain medicines for seizures like carbamazepine, phenobarbital, phenotoin clarithromycin gemfibrozil nefazodone St. John's Wort This list may not describe all possible interactions. Give your health care provider a list of all the medicines, herbs, non-prescription drugs, or dietary supplements you use. Also tell them if you smoke, drink alcohol, or use  illegal drugs. Some items may interact with your medicine. What should I watch for while using this medication? Your condition will be monitored carefully while you are receiving this medicine. You will need important blood work done while you are taking this medicine. This drug may make you feel generally unwell. This is not uncommon, as chemotherapy can affect healthy cells as well as cancer cells. Report any side effects. Continue your course of treatment even though you feel ill unless your doctor tells you to stop. In some cases, you may be given additional medicines to help with side effects. Follow all directions for their use. You may get drowsy or dizzy. Do not drive, use machinery, or do anything that needs mental alertness until you know how this medicine affects you. Do not stand or sit up quickly, especially if you are an older patient. This reduces the risk of dizzy or fainting spells. Call your health care professional for advice if you get a fever, chills, or sore throat, or other symptoms of a cold or flu. Do not treat yourself. This medicine decreases your body's ability to fight infections. Try to avoid being around people who are sick. Avoid taking products that contain aspirin, acetaminophen, ibuprofen, naproxen, or ketoprofen unless instructed by your doctor. These medicines may hide a fever. This medicine may increase your risk to bruise or bleed. Call your doctor or health care professional if you notice any unusual bleeding. Be careful brushing and flossing your teeth or using a toothpick because you may get an infection or bleed more easily. If you have any dental work done, tell your dentist you are receiving this medicine. Do not become pregnant while taking this medicine or for 6 months after stopping it. Women should inform their health care professional if they wish to become pregnant or think they might be pregnant. Men should not father a child while taking this medicine  and for 3 months after stopping it. There is potential for serious side effects to an unborn child. Talk to your health care professional for more information. Do not breast-feed an infant while taking this medicine or for 7 days after stopping it. This medicine has caused ovarian failure in some women. This medicine may make it more difficult to get pregnant. Talk to your health care professional if you are concerned about your fertility. This medicine has caused decreased sperm counts in some men. This may make it more difficult to father a child. Talk to your health care professional if you are concerned about your fertility. What side effects may I notice from receiving this medication? Side effects that you should report to your doctor or health care  professional as soon as possible: allergic reactions like skin rash, itching or hives, swelling of the face, lips, or tongue chest pain diarrhea flushing, runny nose, sweating during infusion low blood counts - this medicine may decrease the number of white blood cells, red blood cells and platelets. You may be at increased risk for infections and bleeding. nausea, vomiting pain, swelling, warmth in the leg signs of decreased platelets or bleeding - bruising, pinpoint red spots on the skin, black, tarry stools, blood in the urine signs of infection - fever or chills, cough, sore throat, pain or difficulty passing urine signs of decreased red blood cells - unusually weak or tired, fainting spells, lightheadedness Side effects that usually do not require medical attention (report to your doctor or health care professional if they continue or are bothersome): constipation hair loss headache loss of appetite mouth sores stomach pain This list may not describe all possible side effects. Call your doctor for medical advice about side effects. You may report side effects to FDA at 1-800-FDA-1088. Where should I keep my medication? This drug is  given in a hospital or clinic and will not be stored at home. NOTE: This sheet is a summary. It may not cover all possible information. If you have questions about this medicine, talk to your doctor, pharmacist, or health care provider.  2022 Elsevier/Gold Standard (2019-07-01 17:46:13)  Leucovorin injection What is this medication? LEUCOVORIN (loo koe VOR in) is used to prevent or treat the harmful effects of some medicines. This medicine is used to treat anemia caused by a low amount of folic acid in the body. It is also used with 5-fluorouracil (5-FU) to treat colon cancer. This medicine may be used for other purposes; ask your health care provider or pharmacist if you have questions. What should I tell my care team before I take this medication? They need to know if you have any of these conditions: anemia from low levels of vitamin B-12 in the blood an unusual or allergic reaction to leucovorin, folic acid, other medicines, foods, dyes, or preservatives pregnant or trying to get pregnant breast-feeding How should I use this medication? This medicine is for injection into a muscle or into a vein. It is given by a health care professional in a hospital or clinic setting. Talk to your pediatrician regarding the use of this medicine in children. Special care may be needed. Overdosage: If you think you have taken too much of this medicine contact a poison control center or emergency room at once. NOTE: This medicine is only for you. Do not share this medicine with others. What if I miss a dose? This does not apply. What may interact with this medication? capecitabine fluorouracil phenobarbital phenytoin primidone trimethoprim-sulfamethoxazole This list may not describe all possible interactions. Give your health care provider a list of all the medicines, herbs, non-prescription drugs, or dietary supplements you use. Also tell them if you smoke, drink alcohol, or use illegal drugs. Some  items may interact with your medicine. What should I watch for while using this medication? Your condition will be monitored carefully while you are receiving this medicine. This medicine may increase the side effects of 5-fluorouracil, 5-FU. Tell your doctor or health care professional if you have diarrhea or mouth sores that do not get better or that get worse. What side effects may I notice from receiving this medication? Side effects that you should report to your doctor or health care professional as soon as possible: allergic reactions like  skin rash, itching or hives, swelling of the face, lips, or tongue breathing problems fever, infection mouth sores unusual bleeding or bruising unusually weak or tired Side effects that usually do not require medical attention (report to your doctor or health care professional if they continue or are bothersome): constipation or diarrhea loss of appetite nausea, vomiting This list may not describe all possible side effects. Call your doctor for medical advice about side effects. You may report side effects to FDA at 1-800-FDA-1088. Where should I keep my medication? This drug is given in a hospital or clinic and will not be stored at home. NOTE: This sheet is a summary. It may not cover all possible information. If you have questions about this medicine, talk to your doctor, pharmacist, or health care provider.  2022 Elsevier/Gold Standard (2008-02-04 16:50:29)  Fluorouracil, 5-FU injection What is this medication? FLUOROURACIL, 5-FU (flure oh YOOR a sil) is a chemotherapy drug. It slows the growth of cancer cells. This medicine is used to treat many types of cancer like breast cancer, colon or rectal cancer, pancreatic cancer, and stomach cancer. This medicine may be used for other purposes; ask your health care provider or pharmacist if you have questions. COMMON BRAND NAME(S): Adrucil What should I tell my care team before I take this  medication? They need to know if you have any of these conditions: blood disorders dihydropyrimidine dehydrogenase (DPD) deficiency infection (especially a virus infection such as chickenpox, cold sores, or herpes) kidney disease liver disease malnourished, poor nutrition recent or ongoing radiation therapy an unusual or allergic reaction to fluorouracil, other chemotherapy, other medicines, foods, dyes, or preservatives pregnant or trying to get pregnant breast-feeding How should I use this medication? This drug is given as an infusion or injection into a vein. It is administered in a hospital or clinic by a specially trained health care professional. Talk to your pediatrician regarding the use of this medicine in children. Special care may be needed. Overdosage: If you think you have taken too much of this medicine contact a poison control center or emergency room at once. NOTE: This medicine is only for you. Do not share this medicine with others. What if I miss a dose? It is important not to miss your dose. Call your doctor or health care professional if you are unable to keep an appointment. What may interact with this medication? Do not take this medicine with any of the following medications: live virus vaccines This medicine may also interact with the following medications: medicines that treat or prevent blood clots like warfarin, enoxaparin, and dalteparin This list may not describe all possible interactions. Give your health care provider a list of all the medicines, herbs, non-prescription drugs, or dietary supplements you use. Also tell them if you smoke, drink alcohol, or use illegal drugs. Some items may interact with your medicine. What should I watch for while using this medication? Visit your doctor for checks on your progress. This drug may make you feel generally unwell. This is not uncommon, as chemotherapy can affect healthy cells as well as cancer cells. Report any  side effects. Continue your course of treatment even though you feel ill unless your doctor tells you to stop. In some cases, you may be given additional medicines to help with side effects. Follow all directions for their use. Call your doctor or health care professional for advice if you get a fever, chills or sore throat, or other symptoms of a cold or flu. Do not  treat yourself. This drug decreases your body's ability to fight infections. Try to avoid being around people who are sick. This medicine may increase your risk to bruise or bleed. Call your doctor or health care professional if you notice any unusual bleeding. Be careful brushing and flossing your teeth or using a toothpick because you may get an infection or bleed more easily. If you have any dental work done, tell your dentist you are receiving this medicine. Avoid taking products that contain aspirin, acetaminophen, ibuprofen, naproxen, or ketoprofen unless instructed by your doctor. These medicines may hide a fever. Do not become pregnant while taking this medicine. Women should inform their doctor if they wish to become pregnant or think they might be pregnant. There is a potential for serious side effects to an unborn child. Talk to your health care professional or pharmacist for more information. Do not breast-feed an infant while taking this medicine. Men should inform their doctor if they wish to father a child. This medicine may lower sperm counts. Do not treat diarrhea with over the counter products. Contact your doctor if you have diarrhea that lasts more than 2 days or if it is severe and watery. This medicine can make you more sensitive to the sun. Keep out of the sun. If you cannot avoid being in the sun, wear protective clothing and use sunscreen. Do not use sun lamps or tanning beds/booths. What side effects may I notice from receiving this medication? Side effects that you should report to your doctor or health care  professional as soon as possible: allergic reactions like skin rash, itching or hives, swelling of the face, lips, or tongue low blood counts - this medicine may decrease the number of white blood cells, red blood cells and platelets. You may be at increased risk for infections and bleeding. signs of infection - fever or chills, cough, sore throat, pain or difficulty passing urine signs of decreased platelets or bleeding - bruising, pinpoint red spots on the skin, black, tarry stools, blood in the urine signs of decreased red blood cells - unusually weak or tired, fainting spells, lightheadedness breathing problems changes in vision chest pain mouth sores nausea and vomiting pain, swelling, redness at site where injected pain, tingling, numbness in the hands or feet redness, swelling, or sores on hands or feet stomach pain unusual bleeding Side effects that usually do not require medical attention (report to your doctor or health care professional if they continue or are bothersome): changes in finger or toe nails diarrhea dry or itchy skin hair loss headache loss of appetite sensitivity of eyes to the light stomach upset unusually teary eyes This list may not describe all possible side effects. Call your doctor for medical advice about side effects. You may report side effects to FDA at 1-800-FDA-1088. Where should I keep my medication? This drug is given in a hospital or clinic and will not be stored at home. NOTE: This sheet is a summary. It may not cover all possible information. If you have questions about this medicine, talk to your doctor, pharmacist, or health care provider.  2022 Elsevier/Gold Standard (2019-07-01 15:00:03)

## 2021-04-14 NOTE — Progress Notes (Signed)
MD review of CBC/CMP today--OK to treat.

## 2021-04-14 NOTE — Progress Notes (Signed)
[place] OFFICE PROGRESS NOTE   Diagnosis: Colon cancer  INTERVAL HISTORY:   Stacy Burns returns as scheduled.  He completed another cycle of FOLFIRI/Avastin on 03/24/2021.  No nausea.  She reports 2 episodes of diarrhea following chemotherapy.  She has mild bleeding with blowing her nose.  No other bleeding.  The bruising at the arms has improved.  She is working.  Objective:  Vital signs in last 24 hours:  Blood pressure (!) 139/58, pulse 66, temperature 97.8 F (36.6 C), temperature source Oral, resp. rate 20, height _0  (1.702 m), weight 127 lb 12.8 oz (58 kg), SpO2 100 %.    HEENT: No thrush or ulcers Resp: Lungs clear bilaterally Cardio: Regular rate and rhythm GI: No hepatosplenomegaly, firm fullness in the right lower abdomen-reducible,?  Hernia Vascular: No leg edema  Skin: Ecchymoses at the forearms  Portacath/PICC-without erythema  Lab Results:  Lab Results  Component Value Date   WBC 7.3 04/14/2021   HGB 9.6 (L) 04/14/2021   HCT 30.7 (L) 04/14/2021   MCV 101.0 (H) 04/14/2021   PLT 120 (L) 04/14/2021   NEUTROABS 5.3 04/14/2021    CMP  Lab Results  Component Value Date   NA 141 03/24/2021   K 4.5 03/24/2021   CL 108 03/24/2021   CO2 27 03/24/2021   GLUCOSE 79 03/24/2021   BUN 21 03/24/2021   CREATININE 0.56 03/24/2021   CALCIUM 8.5 (L) 03/24/2021   PROT 6.2 (L) 03/24/2021   ALBUMIN 3.6 03/24/2021   AST 33 03/24/2021   ALT 29 03/24/2021   ALKPHOS 178 (H) 03/24/2021   BILITOT 0.3 03/24/2021   GFRNONAA >60 03/24/2021   GFRAA >60 05/06/2020    Lab Results  Component Value Date   CEA1 54.57 (H) 12/30/2020   CEA 25.95 (H) 03/24/2021     Medications: I have reviewed the patient's current medications.   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  Cirrhosis.  Innumerable liver lesions. Colonoscopy  12/18/2019-ulcerated partially obstructing large mass in the mid ascending colon.  Biopsy invasive adenocarcinoma; preserved expression major MMR proteins Upper endoscopy 12/18/2019-normal esophagus, stomach, examined duodenum CEA 12/19/2019-8,923 Biopsy liver lesion 12/23/2019-adenocarcinoma Foundation 1-K-ras wild-type; tumor mutation burden greater than or equal to 10; microsatellite stable; BRAF V600E Cycle 1 FOLFOX 01/01/2020 Cycle 2 FOLFOX 01/15/2020  Cycle 3 FOLFOX 01/29/2020, Udenyca added Cycle 4 FOLFOX 02/12/2020, Udenyca held Cycle 5 FOLFOX 02/26/2020, Udenyca Cycle 6 FOLFOX 03/11/2020, Udenyca held CT abdomen/pelvis 03/16/2020-stable diffuse liver lesions, stable strictured appearing ascending colon, possible pericolonic implant, vague left lower lobe nodule Cycle 1 FOLFIRI/Avastin 04/06/2020 Cycle 2 FOLFIRI/Avastin 04/21/2020 Cycle 3 FOLFIRI/Avastin 05/06/2020 Cycle 4 FOLFIRI/Avastin 05/20/2020 Cycle 5 FOLFIRI/Avastin 06/03/2020 CTs 06/15/2020-mild to moderate improvement in hepatic metastasis.  No new or progressive disease.  Narrowing within the proximal ascending colon with upstream mild cecal and terminal ileum dilatation, similar. Cycle 6 FOLFIRI/Avastin 06/17/2020 Cycle 7 FOLFIRI/Avastin 07/01/2020 Cycle 8 FOLFIRI/Avastin 07/15/2020 Cycle 9 FOLFIRI/Avastin 07/29/2020-5-FU bolus eliminated, 5-FU infusion and irinotecan dose reduced Cycle 10 FOLFIRI/Avastin 08/12/2020 CTs 08/23/2020- mild residual wall thickening involving the cecum/ascending colon.  Numerous liver metastases improved. Cycle 11 FOLFIRI/Avastin 08/25/2020 Cycle 12 FOLFIRI/Avastin 09/16/2020 Cycle 13 FOLFIRI/Avastin 10/07/2020 Cycle 14 FOLFIRI/Avastin 10/28/2020 Cycle 15 FOLFIRI/Avastin 11/18/2020 CT abdomen/pelvis 12/06/2020-mild improvement in multifocal hepatic metastases, wall thickening at the ascending colon with pericolonic inflammatory changes Cycle 16 FOLFIRI/Avastin 12/09/2020 Cycle 17 FOLFIRI/Avastin 12/30/2020 Cycle 18  FOLFIRI/Avastin 01/20/2021 Cycle 19 FOLFIRI/Avastin  02/10/2021 Cycle 20 FOLFIRI/Avastin 03/03/2021 Cycle 21 FOLFIRI/Avastin 03/24/2021 Cycle 22 FOLFIRI/Avastin 04/14/2021 Anemia secondary to GI blood loss, on oral iron BID  Cirrhosis 12/26/2019-hospital admission for right lower extremity DVT and GI bleed, treated with heparin anticoagulation beginning 12/26/2019, converted to Lovenox at discharge 12/29/2019 Lovenox reduced to 40 mg daily 02/10/2021 secondary to bruising and nosebleeding History of low-grade fever-likely "tumor" fever COVID-19 infection January 2021     Disposition: Stacy Burns appears stable.  She will complete another cycle of FOLFIRI/Avastin today.  She will undergo a restaging CT evaluation prior to an office visit in 3 weeks.   Stacy Coder, MD  04/14/2021  9:09 AM

## 2021-04-16 ENCOUNTER — Other Ambulatory Visit: Payer: Self-pay

## 2021-04-16 ENCOUNTER — Inpatient Hospital Stay: Payer: BC Managed Care – PPO

## 2021-04-16 VITALS — BP 130/56 | HR 73 | Temp 98.0°F | Resp 18

## 2021-04-16 DIAGNOSIS — Z7901 Long term (current) use of anticoagulants: Secondary | ICD-10-CM | POA: Diagnosis not present

## 2021-04-16 DIAGNOSIS — Z452 Encounter for adjustment and management of vascular access device: Secondary | ICD-10-CM | POA: Diagnosis not present

## 2021-04-16 DIAGNOSIS — C182 Malignant neoplasm of ascending colon: Secondary | ICD-10-CM | POA: Diagnosis not present

## 2021-04-16 DIAGNOSIS — C787 Secondary malignant neoplasm of liver and intrahepatic bile duct: Secondary | ICD-10-CM | POA: Diagnosis not present

## 2021-04-16 DIAGNOSIS — D500 Iron deficiency anemia secondary to blood loss (chronic): Secondary | ICD-10-CM | POA: Diagnosis not present

## 2021-04-16 DIAGNOSIS — Z86718 Personal history of other venous thrombosis and embolism: Secondary | ICD-10-CM | POA: Diagnosis not present

## 2021-04-16 DIAGNOSIS — Z5189 Encounter for other specified aftercare: Secondary | ICD-10-CM | POA: Diagnosis not present

## 2021-04-16 DIAGNOSIS — Z5111 Encounter for antineoplastic chemotherapy: Secondary | ICD-10-CM | POA: Diagnosis not present

## 2021-04-16 DIAGNOSIS — K7460 Unspecified cirrhosis of liver: Secondary | ICD-10-CM | POA: Diagnosis not present

## 2021-04-16 DIAGNOSIS — Z5112 Encounter for antineoplastic immunotherapy: Secondary | ICD-10-CM | POA: Diagnosis not present

## 2021-04-16 MED ORDER — PEGFILGRASTIM-CBQV 6 MG/0.6ML ~~LOC~~ SOSY
PREFILLED_SYRINGE | SUBCUTANEOUS | Status: AC
  Filled 2021-04-16: qty 0.6

## 2021-04-16 MED ORDER — PEGFILGRASTIM-CBQV 6 MG/0.6ML ~~LOC~~ SOSY
6.0000 mg | PREFILLED_SYRINGE | Freq: Once | SUBCUTANEOUS | Status: AC
  Administered 2021-04-16: 6 mg via SUBCUTANEOUS

## 2021-04-29 ENCOUNTER — Other Ambulatory Visit: Payer: Self-pay | Admitting: Oncology

## 2021-04-29 DIAGNOSIS — C182 Malignant neoplasm of ascending colon: Secondary | ICD-10-CM

## 2021-04-30 ENCOUNTER — Other Ambulatory Visit: Payer: Self-pay | Admitting: Oncology

## 2021-05-02 ENCOUNTER — Encounter: Payer: Self-pay | Admitting: Oncology

## 2021-05-03 DIAGNOSIS — C182 Malignant neoplasm of ascending colon: Secondary | ICD-10-CM | POA: Diagnosis not present

## 2021-05-04 ENCOUNTER — Other Ambulatory Visit: Payer: Self-pay

## 2021-05-04 ENCOUNTER — Inpatient Hospital Stay: Payer: BC Managed Care – PPO

## 2021-05-04 ENCOUNTER — Ambulatory Visit (HOSPITAL_BASED_OUTPATIENT_CLINIC_OR_DEPARTMENT_OTHER)
Admission: RE | Admit: 2021-05-04 | Discharge: 2021-05-04 | Disposition: A | Discharge status: Home / Self Care | Payer: BC Managed Care – PPO | Source: Ambulatory Visit | Attending: Oncology | Admitting: Oncology

## 2021-05-04 DIAGNOSIS — D500 Iron deficiency anemia secondary to blood loss (chronic): Secondary | ICD-10-CM | POA: Diagnosis not present

## 2021-05-04 DIAGNOSIS — Z5112 Encounter for antineoplastic immunotherapy: Secondary | ICD-10-CM | POA: Diagnosis not present

## 2021-05-04 DIAGNOSIS — Z5111 Encounter for antineoplastic chemotherapy: Secondary | ICD-10-CM | POA: Diagnosis not present

## 2021-05-04 DIAGNOSIS — K7460 Unspecified cirrhosis of liver: Secondary | ICD-10-CM | POA: Diagnosis not present

## 2021-05-04 DIAGNOSIS — Z86718 Personal history of other venous thrombosis and embolism: Secondary | ICD-10-CM | POA: Diagnosis not present

## 2021-05-04 DIAGNOSIS — Z95828 Presence of other vascular implants and grafts: Secondary | ICD-10-CM

## 2021-05-04 DIAGNOSIS — C182 Malignant neoplasm of ascending colon: Secondary | ICD-10-CM

## 2021-05-04 DIAGNOSIS — R161 Splenomegaly, not elsewhere classified: Secondary | ICD-10-CM | POA: Diagnosis not present

## 2021-05-04 DIAGNOSIS — C188 Malignant neoplasm of overlapping sites of colon: Secondary | ICD-10-CM | POA: Diagnosis not present

## 2021-05-04 DIAGNOSIS — Z7901 Long term (current) use of anticoagulants: Secondary | ICD-10-CM | POA: Diagnosis not present

## 2021-05-04 DIAGNOSIS — Z452 Encounter for adjustment and management of vascular access device: Secondary | ICD-10-CM | POA: Diagnosis not present

## 2021-05-04 DIAGNOSIS — C787 Secondary malignant neoplasm of liver and intrahepatic bile duct: Secondary | ICD-10-CM | POA: Diagnosis not present

## 2021-05-04 DIAGNOSIS — Z5189 Encounter for other specified aftercare: Secondary | ICD-10-CM | POA: Diagnosis not present

## 2021-05-04 LAB — CBC WITH DIFFERENTIAL (CANCER CENTER ONLY)
Abs Immature Granulocytes: 0.06 10*3/uL (ref 0.00–0.07)
Basophils Absolute: 0.1 10*3/uL (ref 0.0–0.1)
Basophils Relative: 1 %
Eosinophils Absolute: 0.1 10*3/uL (ref 0.0–0.5)
Eosinophils Relative: 2 %
HCT: 33 % — ABNORMAL LOW (ref 36.0–46.0)
Hemoglobin: 10.3 g/dL — ABNORMAL LOW (ref 12.0–15.0)
Immature Granulocytes: 1 %
Lymphocytes Relative: 18 %
Lymphs Abs: 1.3 10*3/uL (ref 0.7–4.0)
MCH: 31 pg (ref 26.0–34.0)
MCHC: 31.2 g/dL (ref 30.0–36.0)
MCV: 99.4 fL (ref 80.0–100.0)
Monocytes Absolute: 0.6 10*3/uL (ref 0.1–1.0)
Monocytes Relative: 8 %
Neutro Abs: 4.9 10*3/uL (ref 1.7–7.7)
Neutrophils Relative %: 70 %
Platelet Count: 126 10*3/uL — ABNORMAL LOW (ref 150–400)
RBC: 3.32 MIL/uL — ABNORMAL LOW (ref 3.87–5.11)
RDW: 15.7 % — ABNORMAL HIGH (ref 11.5–15.5)
WBC Count: 7 10*3/uL (ref 4.0–10.5)
nRBC: 0 % (ref 0.0–0.2)

## 2021-05-04 LAB — CMP (CANCER CENTER ONLY)
ALT: 28 U/L (ref 0–44)
AST: 33 U/L (ref 15–41)
Albumin: 3.7 g/dL (ref 3.5–5.0)
Alkaline Phosphatase: 175 U/L — ABNORMAL HIGH (ref 38–126)
Anion gap: 8 (ref 5–15)
BUN: 21 mg/dL (ref 8–23)
CO2: 26 mmol/L (ref 22–32)
Calcium: 9.5 mg/dL (ref 8.9–10.3)
Chloride: 106 mmol/L (ref 98–111)
Creatinine: 0.61 mg/dL (ref 0.44–1.00)
GFR, Estimated: >60 mL/min (ref >60)
Glucose, Bld: 80 mg/dL (ref 70–99)
Potassium: 4 mmol/L (ref 3.5–5.1)
Sodium: 140 mmol/L (ref 135–145)
Total Bilirubin: 0.4 mg/dL (ref 0.3–1.2)
Total Protein: 6.3 g/dL — ABNORMAL LOW (ref 6.5–8.1)

## 2021-05-04 LAB — TOTAL PROTEIN, URINE DIPSTICK: Protein, ur: NEGATIVE mg/dL

## 2021-05-04 LAB — CEA (ACCESS): CEA (CHCC): 22.92 ng/mL — ABNORMAL HIGH (ref 0.00–5.00)

## 2021-05-04 MED ORDER — SODIUM CHLORIDE 0.9% FLUSH
10.0000 mL | Freq: Once | INTRAVENOUS | Status: AC
  Administered 2021-05-04: 10 mL

## 2021-05-04 MED ORDER — HEPARIN SOD (PORK) LOCK FLUSH 100 UNIT/ML IV SOLN
500.0000 [IU] | Freq: Once | INTRAVENOUS | Status: AC
  Administered 2021-05-04: 500 [IU]

## 2021-05-04 MED ORDER — IOHEXOL 350 MG/ML SOLN
60.0000 mL | Freq: Once | INTRAVENOUS | Status: AC | PRN
  Administered 2021-05-04: 60 mL via INTRAVENOUS

## 2021-05-04 NOTE — Addendum Note (Signed)
Addended by: Teodoro Spray on: 05/04/2021 10:46 AM   Modules accepted: Orders

## 2021-05-05 ENCOUNTER — Other Ambulatory Visit: Payer: Self-pay

## 2021-05-05 ENCOUNTER — Other Ambulatory Visit: Payer: BC Managed Care – PPO

## 2021-05-05 ENCOUNTER — Inpatient Hospital Stay: Payer: BC Managed Care – PPO

## 2021-05-05 ENCOUNTER — Inpatient Hospital Stay: Payer: BC Managed Care – PPO | Admitting: Oncology

## 2021-05-05 ENCOUNTER — Telehealth: Payer: Self-pay

## 2021-05-05 VITALS — BP 115/58 | HR 95 | Temp 98.2°F | Resp 19 | Ht 67.0 in | Wt 122.6 lb

## 2021-05-05 DIAGNOSIS — Z86718 Personal history of other venous thrombosis and embolism: Secondary | ICD-10-CM | POA: Diagnosis not present

## 2021-05-05 DIAGNOSIS — Z452 Encounter for adjustment and management of vascular access device: Secondary | ICD-10-CM | POA: Diagnosis not present

## 2021-05-05 DIAGNOSIS — Z5111 Encounter for antineoplastic chemotherapy: Secondary | ICD-10-CM | POA: Diagnosis not present

## 2021-05-05 DIAGNOSIS — K7460 Unspecified cirrhosis of liver: Secondary | ICD-10-CM | POA: Diagnosis not present

## 2021-05-05 DIAGNOSIS — C787 Secondary malignant neoplasm of liver and intrahepatic bile duct: Secondary | ICD-10-CM | POA: Diagnosis not present

## 2021-05-05 DIAGNOSIS — C182 Malignant neoplasm of ascending colon: Secondary | ICD-10-CM | POA: Diagnosis not present

## 2021-05-05 DIAGNOSIS — Z5112 Encounter for antineoplastic immunotherapy: Secondary | ICD-10-CM | POA: Diagnosis not present

## 2021-05-05 DIAGNOSIS — D649 Anemia, unspecified: Secondary | ICD-10-CM

## 2021-05-05 DIAGNOSIS — Z5189 Encounter for other specified aftercare: Secondary | ICD-10-CM | POA: Diagnosis not present

## 2021-05-05 DIAGNOSIS — D500 Iron deficiency anemia secondary to blood loss (chronic): Secondary | ICD-10-CM | POA: Diagnosis not present

## 2021-05-05 DIAGNOSIS — Z7901 Long term (current) use of anticoagulants: Secondary | ICD-10-CM | POA: Diagnosis not present

## 2021-05-05 MED ORDER — PALONOSETRON HCL INJECTION 0.25 MG/5ML
0.2500 mg | Freq: Once | INTRAVENOUS | Status: AC
  Administered 2021-05-05: 0.25 mg via INTRAVENOUS
  Filled 2021-05-05: qty 5

## 2021-05-05 MED ORDER — OXYCODONE-ACETAMINOPHEN 5-325 MG PO TABS: 1.0000 | ORAL_TABLET | Freq: Three times a day (TID) | ORAL | 0 refills | Status: DC | PRN

## 2021-05-05 MED ORDER — HEPARIN SOD (PORK) LOCK FLUSH 100 UNIT/ML IV SOLN
500.0000 [IU] | Freq: Once | INTRAVENOUS | Status: DC | PRN
Start: 2021-05-05 — End: 2021-05-05

## 2021-05-05 MED ORDER — SODIUM CHLORIDE 0.9 % IV SOLN: Freq: Once | INTRAVENOUS | Status: AC

## 2021-05-05 MED ORDER — SODIUM CHLORIDE 0.9 % IV SOLN
5.0000 mg/kg | Freq: Once | INTRAVENOUS | Status: AC
  Administered 2021-05-05: 275 mg via INTRAVENOUS
  Filled 2021-05-05: qty 11

## 2021-05-05 MED ORDER — SODIUM CHLORIDE 0.9% FLUSH: 10.0000 mL | INTRAVENOUS | Status: DC | PRN

## 2021-05-05 MED ORDER — SODIUM CHLORIDE 0.9 % IV SOLN
140.0000 mg/m2 | Freq: Once | INTRAVENOUS | Status: AC
  Administered 2021-05-05: 220 mg via INTRAVENOUS
  Filled 2021-05-05: qty 11

## 2021-05-05 MED ORDER — SODIUM CHLORIDE 0.9 % IV SOLN
200.0000 mg/m2 | Freq: Once | INTRAVENOUS | Status: AC
  Administered 2021-05-05: 318 mg via INTRAVENOUS
  Filled 2021-05-05: qty 15.9

## 2021-05-05 MED ORDER — ATROPINE SULFATE 1 MG/ML IJ SOLN
0.5000 mg | Freq: Once | INTRAMUSCULAR | Status: AC | PRN
  Administered 2021-05-05: 0.5 mg via INTRAVENOUS
  Filled 2021-05-05: qty 1

## 2021-05-05 MED ORDER — FERROUS SULFATE 325 (65 FE) MG PO TABS: 325.0000 mg | ORAL_TABLET | Freq: Every day | ORAL | 1 refills | Status: DC

## 2021-05-05 MED ORDER — FLUOROURACIL CHEMO INJECTION 5 GM/100ML
2500.0000 mg | INTRAVENOUS | Status: DC
  Administered 2021-05-05: 2500 mg via INTRAVENOUS
  Filled 2021-05-05: qty 50

## 2021-05-05 MED ORDER — SODIUM CHLORIDE 0.9 % IV SOLN
10.0000 mg | Freq: Once | INTRAVENOUS | Status: AC
  Administered 2021-05-05: 10 mg via INTRAVENOUS
  Filled 2021-05-05: qty 1

## 2021-05-05 MED ORDER — LIDOCAINE-PRILOCAINE 2.5-2.5 % EX CREA: 1.0000 "application " | TOPICAL_CREAM | CUTANEOUS | 0 refills | Status: DC | PRN

## 2021-05-05 NOTE — Progress Notes (Signed)
Patient presents for treatment. RN assessment completed along with the following:  Labs/vitals reviewed - Yes, and within treatment parameters.   Weight within 10% of previous measurement - No, provider and pharmacy notified of weight change. Oncology Treatment Attestation completed for current therapy- Yes, on date 03/25/2021 Informed consent completed and reflects current therapy/intent - Yes, on date 04/21/2020             Provider progress note reviewed - Yes, today's provider note was reviewed. Treatment/Antibyes ","Yes, and there are no adjustments needed for today's treatment."} S&H and other orders reviewed - Yes, and there are no additional orders identified. Previous treatment date reviewed - Yes, and within parameters Clinic Hand Off Received from - no   Patient to proceed with treatment.

## 2021-05-05 NOTE — Telephone Encounter (Signed)
Patient seen by Dr. Benay Spice today  Vitals are within treatment parameters.  Labs reviewed by Dr. Benay Spice  Platelet count 126and are not all within treatment parameters.      Per physician team, patient is ready for treatment and there are NO modifications to the treatment plan.

## 2021-05-05 NOTE — Progress Notes (Signed)
[place] OFFICE PROGRESS NOTE   Diagnosis: Colon cancer  INTERVAL HISTORY:   Stacy Burns completed another cycle of FOLFIRI/bevacizumab on 04/14/2021.  No nausea.  She reports diarrhea approximately 2 times daily during the week following chemotherapy.  She is not taking antidiarrhea medication.  The diarrhea resolved and returned after she took CT contrast yesterday.  No bleeding.  Stacy Burns is working.  She takes oxycodone as needed in the evening for abdomen and back pain.  Objective:  Vital signs in last 24 hours:  Blood pressure (!) 115/58, pulse 95, temperature 98.2 F (36.8 C), temperature source Oral, resp. rate 19, height 5' 7" (1.702 m), weight 122 lb 9.6 oz (55.6 kg), SpO2 98 %.    HEENT: No thrush or ulcers Resp: Lungs clear bilaterally Cardio: Regular rate and rhythm GI: No mass, nontender, no hepatosplenomegaly Vascular: No leg edema  Skin: Multiple ecchymoses over the abdominal wall  Portacath/PICC-without erythema  Lab Results:  Lab Results  Component Value Date   WBC 7.0 05/04/2021   HGB 10.3 (L) 05/04/2021   HCT 33.0 (L) 05/04/2021   MCV 99.4 05/04/2021   PLT 126 (L) 05/04/2021   NEUTROABS 4.9 05/04/2021    CMP  Lab Results  Component Value Date   NA 140 05/04/2021   K 4.0 05/04/2021   CL 106 05/04/2021   CO2 26 05/04/2021   GLUCOSE 80 05/04/2021   BUN 21 05/04/2021   CREATININE 0.61 05/04/2021   CALCIUM 9.5 05/04/2021   PROT 6.3 (L) 05/04/2021   ALBUMIN 3.7 05/04/2021   AST 33 05/04/2021   ALT 28 05/04/2021   ALKPHOS 175 (H) 05/04/2021   BILITOT 0.4 05/04/2021   GFRNONAA >60 05/04/2021   GFRAA >60 05/06/2020    Lab Results  Component Value Date   CEA1 54.57 (H) 12/30/2020   CEA 22.92 (H) 05/04/2021     Medications: I have reviewed the patient's current medications.   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  Cirrhosis.  Innumerable liver lesions. Colonoscopy 12/18/2019-ulcerated partially obstructing large mass in the mid ascending colon.  Biopsy invasive adenocarcinoma; preserved expression major MMR proteins Upper endoscopy 12/18/2019-normal esophagus, stomach, examined duodenum CEA 12/19/2019-8,923 Biopsy liver lesion 12/23/2019-adenocarcinoma Foundation 1-K-ras wild-type; tumor mutation burden greater than or equal to 10; microsatellite stable; BRAF V600E Cycle 1 FOLFOX 01/01/2020 Cycle 2 FOLFOX 01/15/2020  Cycle 3 FOLFOX 01/29/2020, Udenyca added Cycle 4 FOLFOX 02/12/2020, Udenyca held Cycle 5 FOLFOX 02/26/2020, Udenyca Cycle 6 FOLFOX 03/11/2020, Udenyca held CT abdomen/pelvis 03/16/2020-stable diffuse liver lesions, stable strictured appearing ascending colon, possible pericolonic implant, vague left lower lobe nodule Cycle 1 FOLFIRI/Avastin 04/06/2020 Cycle 2 FOLFIRI/Avastin 04/21/2020 Cycle 3 FOLFIRI/Avastin 05/06/2020 Cycle 4 FOLFIRI/Avastin 05/20/2020 Cycle 5 FOLFIRI/Avastin 06/03/2020 CTs 06/15/2020-mild to moderate improvement in hepatic metastasis.  No new or progressive disease.  Narrowing within the proximal ascending colon with upstream mild cecal and terminal ileum dilatation, similar. Cycle 6 FOLFIRI/Avastin 06/17/2020 Cycle 7 FOLFIRI/Avastin 07/01/2020 Cycle 8 FOLFIRI/Avastin 07/15/2020 Cycle 9 FOLFIRI/Avastin 07/29/2020-5-FU bolus eliminated, 5-FU infusion and irinotecan dose reduced Cycle 10 FOLFIRI/Avastin 08/12/2020 CTs 08/23/2020- mild residual wall thickening involving the cecum/ascending colon.  Numerous liver metastases improved. Cycle 11 FOLFIRI/Avastin 08/25/2020 Cycle 12 FOLFIRI/Avastin 09/16/2020 Cycle 13 FOLFIRI/Avastin 10/07/2020 Cycle 14 FOLFIRI/Avastin 10/28/2020 Cycle 15 FOLFIRI/Avastin 11/18/2020 CT abdomen/pelvis 12/06/2020-mild improvement in multifocal hepatic metastases, wall thickening at the ascending colon with pericolonic inflammatory changes Cycle 16  FOLFIRI/Avastin 12/09/2020 Cycle 17  FOLFIRI/Avastin 12/30/2020 Cycle 18 FOLFIRI/Avastin 01/20/2021 Cycle 19 FOLFIRI/Avastin 02/10/2021 Cycle 20 FOLFIRI/Avastin 03/03/2021 Cycle 21 FOLFIRI/Avastin 03/24/2021 Cycle 22 FOLFIRI/Avastin 04/14/2021 Cycle 23 FOLFIRI/Avastin 05/05/2021 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  She continues to tolerate the FOLFIRI/Avastin well.  She underwent restaging CTs yesterday.  I reviewed the CT images with Stacy Burns.  The CT report is not yet available.  Compared to the CT in April many of the liver lesions appear smaller.  We discussed treatment options.  I recommend continuing FOLFIRI/Avastin on the current schedule.  She will complete another cycle today.  Stacy Burns will return for an office visit in the next cycle of chemotherapy in 3 weeks.  I refilled her prescription for oxycodone.  We will contact her when the CT report is available.  [name], MD  05/05/2021  8:57 AM

## 2021-05-05 NOTE — Patient Instructions (Signed)
[facility]  Discharge Instructions: Thank you for choosing [facility] to provide your oncology and hematology care.   If you have a lab appointment with the [facility], please go directly to the [facility] and check in at the registration area.   Wear comfortable clothing and clothing appropriate for easy access to any Portacath or PICC line.   We strive to give you quality time with your provider. You may need to reschedule your appointment if you arrive late (15 or more minutes).  Arriving late affects you and other patients whose appointments are after yours.  Also, if you miss three or more appointments without notifying the office, you may be dismissed from the clinic at the provider's discretion.      For prescription refill requests, have your pharmacy contact our office and allow 72 hours for refills to be completed.   Today you received the following chemotherapy and/or immunotherapy agents bevacizumab-bvzr, irinotecan, leucovorin, fluorouracil   To help prevent nausea and vomiting after your treatment, we encourage you to take your nausea medication as directed.  BELOW ARE SYMPTOMS THAT SHOULD BE REPORTED IMMEDIATELY: *FEVER GREATER THAN 100.4 F (38 C) OR HIGHER *CHILLS OR SWEATING *NAUSEA AND VOMITING THAT IS NOT CONTROLLED WITH YOUR NAUSEA MEDICATION *UNUSUAL SHORTNESS OF BREATH *UNUSUAL BRUISING OR BLEEDING *URINARY PROBLEMS (pain or burning when urinating, or frequent urination) *BOWEL PROBLEMS (unusual diarrhea, constipation, pain near the anus) TENDERNESS IN MOUTH AND THROAT WITH OR WITHOUT PRESENCE OF ULCERS (sore throat, sores in mouth, or a toothache) UNUSUAL RASH, SWELLING OR PAIN  UNUSUAL VAGINAL DISCHARGE OR ITCHING   Items with * indicate a potential emergency and should be followed up as soon as possible or go to the Emergency Department if any problems should occur.  Please show the CHEMOTHERAPY ALERT CARD or  IMMUNOTHERAPY ALERT CARD at check-in to the Emergency Department and triage nurse.  Should you have questions after your visit or need to cancel or reschedule your appointment, please contact [facility]  Dept: [phone]  and follow the prompts.  Office hours are 8:00 a.m. to 4:30 p.m. Monday - Friday. Please note that voicemails left after 4:00 p.m. may not be returned until the following business day.  We are closed weekends and major holidays. You have access to a nurse at all times for urgent questions. Please call the main number to the clinic Dept: [phone] and follow the prompts.   For any non-urgent questions, you may also contact your provider using MyChart. We now offer e-Visits for anyone 22 and older to request care online for non-urgent symptoms. For details visit [email or web address].   Also download the MyChart app! Go to the app store, search "MyChart", open the app, select [facility], and log in with your MyChart username and password.  Due to Covid, a mask is required upon entering the hospital/clinic. If you do not have a mask, one will be given to you upon arrival. For doctor visits, patients may have 1 support person aged 9 or older with them. For treatment visits, patients cannot have anyone with them due to current Covid guidelines and our immunocompromised population.    Bevacizumab injection What is this medication? BEVACIZUMAB (be va SIZ yoo mab) is a monoclonal antibody. It is used to treat many types of cancer. This medicine may be used for other purposes; ask your health care provider or pharmacist if you have questions. COMMON BRAND NAME(S): Avastin, MVASI,  [name] What should I tell my care team before I take this medication? They need to know if you have any of these conditions: diabetes heart disease high blood pressure history of coughing up blood prior anthracycline chemotherapy (e.g., doxorubicin, daunorubicin,  epirubicin) recent or ongoing radiation therapy recent or planning to have surgery stroke an unusual or allergic reaction to bevacizumab, hamster proteins, mouse proteins, other medicines, foods, dyes, or preservatives pregnant or trying to get pregnant breast-feeding How should I use this medication? This medicine is for infusion into a vein. It is given by a health care professional in a hospital or clinic setting. Talk to your pediatrician regarding the use of this medicine in children. Special care may be needed. Overdosage: If you think you have taken too much of this medicine contact a poison control center or emergency room at once. NOTE: This medicine is only for you. Do not share this medicine with others. What if I miss a dose? It is important not to miss your dose. Call your doctor or health care professional if you are unable to keep an appointment. What may interact with this medication? Interactions are not expected. This list may not describe all possible interactions. Give your health care provider a list of all the medicines, herbs, non-prescription drugs, or dietary supplements you use. Also tell them if you smoke, drink alcohol, or use illegal drugs. Some items may interact with your medicine. What should I watch for while using this medication? Your condition will be monitored carefully while you are receiving this medicine. You will need important blood work and urine testing done while you are taking this medicine. This medicine may increase your risk to bruise or bleed. Call your doctor or health care professional if you notice any unusual bleeding. Before having surgery, talk to your health care provider to make sure it is ok. This drug can increase the risk of poor healing of your surgical site or wound. You will need to stop this drug for 28 days before surgery. After surgery, wait at least 28 days before restarting this drug. Make sure the surgical site or wound is  healed enough before restarting this drug. Talk to your health care provider if questions. Do not become pregnant while taking this medicine or for 6 months after stopping it. Women should inform their doctor if they wish to become pregnant or think they might be pregnant. There is a potential for serious side effects to an unborn child. Talk to your health care professional or pharmacist for more information. Do not breast-feed an infant while taking this medicine and for 6 months after the last dose. This medicine has caused ovarian failure in some women. This medicine may interfere with the ability to have a child. You should talk to your doctor or health care professional if you are concerned about your fertility. What side effects may I notice from receiving this medication? Side effects that you should report to your doctor or health care professional as soon as possible: allergic reactions like skin rash, itching or hives, swelling of the face, lips, or tongue chest pain or chest tightness chills coughing up blood high fever seizures severe constipation signs and symptoms of bleeding such as bloody or black, tarry stools; red or dark-Mcauley urine; spitting up blood or A[place] material that looks like coffee grounds; red spots on the skin; unusual bruising or bleeding from the eye, gums, or nose signs and symptoms of a blood clot such as breathing problems; chest  pain; severe, sudden headache; pain, swelling, warmth in the leg signs and symptoms of a stroke like changes in vision; confusion; trouble speaking or understanding; severe headaches; sudden numbness or weakness of the face, arm or leg; trouble walking; dizziness; loss of balance or coordination stomach pain sweating swelling of legs or ankles vomiting weight gain Side effects that usually do not require medical attention (report to your doctor or health care professional if they continue or are bothersome): back pain changes in  taste decreased appetite dry skin nausea tiredness This list may not describe all possible side effects. Call your doctor for medical advice about side effects. You may report side effects to FDA at 1-800-FDA-1088. Where should I keep my medication? This drug is given in a hospital or clinic and will not be stored at home. NOTE: This sheet is a summary. It may not cover all possible information. If you have questions about this medicine, talk to your doctor, pharmacist, or health care provider.  2022 Elsevier/Gold Standard (2019-05-28 10:50:46)  Irinotecan injection What is this medication? IRINOTECAN (ir in oh TEE kan ) is a chemotherapy drug. It is used to treat colon and rectal cancer. This medicine may be used for other purposes; ask your health care provider or pharmacist if you have questions. COMMON BRAND NAME(S): Camptosar What should I tell my care team before I take this medication? They need to know if you have any of these conditions: dehydration diarrhea infection (especially a virus infection such as chickenpox, cold sores, or herpes) liver disease low blood counts, like low white cell, platelet, or red cell counts low levels of calcium, magnesium, or potassium in the blood recent or ongoing radiation therapy an unusual or allergic reaction to irinotecan, other medicines, foods, dyes, or preservatives pregnant or trying to get pregnant breast-feeding How should I use this medication? This drug is given as an infusion into a vein. It is administered in a hospital or clinic by a specially trained health care professional. Talk to your pediatrician regarding the use of this medicine in children. Special care may be needed. Overdosage: If you think you have taken too much of this medicine contact a poison control center or emergency room at once. NOTE: This medicine is only for you. Do not share this medicine with others. What if I miss a dose? It is important not to miss  your dose. Call your doctor or health care professional if you are unable to keep an appointment. What may interact with this medication? Do not take this medicine with any of the following medications: cobicistat itraconazole This medicine may interact with the following medications: antiviral medicines for HIV or AIDS certain antibiotics like rifampin or rifabutin certain medicines for fungal infections like ketoconazole, posaconazole, and voriconazole certain medicines for seizures like carbamazepine, phenobarbital, phenotoin clarithromycin gemfibrozil nefazodone St. John's Wort This list may not describe all possible interactions. Give your health care provider a list of all the medicines, herbs, non-prescription drugs, or dietary supplements you use. Also tell them if you smoke, drink alcohol, or use illegal drugs. Some items may interact with your medicine. What should I watch for while using this medication? Your condition will be monitored carefully while you are receiving this medicine. You will need important blood work done while you are taking this medicine. This drug may make you feel generally unwell. This is not uncommon, as chemotherapy can affect healthy cells as well as cancer cells. Report any side effects. Continue your course of treatment  even though you feel ill unless your doctor tells you to stop. In some cases, you may be given additional medicines to help with side effects. Follow all directions for their use. You may get drowsy or dizzy. Do not drive, use machinery, or do anything that needs mental alertness until you know how this medicine affects you. Do not stand or sit up quickly, especially if you are an older patient. This reduces the risk of dizzy or fainting spells. Call your health care professional for advice if you get a fever, chills, or sore throat, or other symptoms of a cold or flu. Do not treat yourself. This medicine decreases your body's ability to  fight infections. Try to avoid being around people who are sick. Avoid taking products that contain aspirin, acetaminophen, ibuprofen, naproxen, or ketoprofen unless instructed by your doctor. These medicines may hide a fever. This medicine may increase your risk to bruise or bleed. Call your doctor or health care professional if you notice any unusual bleeding. Be careful brushing and flossing your teeth or using a toothpick because you may get an infection or bleed more easily. If you have any dental work done, tell your dentist you are receiving this medicine. Do not become pregnant while taking this medicine or for 6 months after stopping it. Women should inform their health care professional if they wish to become pregnant or think they might be pregnant. Men should not father a child while taking this medicine and for 3 months after stopping it. There is potential for serious side effects to an unborn child. Talk to your health care professional for more information. Do not breast-feed an infant while taking this medicine or for 7 days after stopping it. This medicine has caused ovarian failure in some women. This medicine may make it more difficult to get pregnant. Talk to your health care professional if you are concerned about your fertility. This medicine has caused decreased sperm counts in some men. This may make it more difficult to father a child. Talk to your health care professional if you are concerned about your fertility. What side effects may I notice from receiving this medication? Side effects that you should report to your doctor or health care professional as soon as possible: allergic reactions like skin rash, itching or hives, swelling of the face, lips, or tongue chest pain diarrhea flushing, runny nose, sweating during infusion low blood counts - this medicine may decrease the number of white blood cells, red blood cells and platelets. You may be at increased risk for  infections and bleeding. nausea, vomiting pain, swelling, warmth in the leg signs of decreased platelets or bleeding - bruising, pinpoint red spots on the skin, black, tarry stools, blood in the urine signs of infection - fever or chills, cough, sore throat, pain or difficulty passing urine signs of decreased red blood cells - unusually weak or tired, fainting spells, lightheadedness Side effects that usually do not require medical attention (report to your doctor or health care professional if they continue or are bothersome): constipation hair loss headache loss of appetite mouth sores stomach pain This list may not describe all possible side effects. Call your doctor for medical advice about side effects. You may report side effects to FDA at 1-800-FDA-1088. Where should I keep my medication? This drug is given in a hospital or clinic and will not be stored at home. NOTE: This sheet is a summary. It may not cover all possible information. If you have questions  about this medicine, talk to your doctor, pharmacist, or health care provider.  2022 Elsevier/Gold Standard (2019-07-01 17:46:13)  Leucovorin injection What is this medication? LEUCOVORIN (loo koe VOR in) is used to prevent or treat the harmful effects of some medicines. This medicine is used to treat anemia caused by a low amount of folic acid in the body. It is also used with 5-fluorouracil (5-FU) to treat colon cancer. This medicine may be used for other purposes; ask your health care provider or pharmacist if you have questions. What should I tell my care team before I take this medication? They need to know if you have any of these conditions: anemia from low levels of vitamin B-12 in the blood an unusual or allergic reaction to leucovorin, folic acid, other medicines, foods, dyes, or preservatives pregnant or trying to get pregnant breast-feeding How should I use this medication? This medicine is for injection into a  muscle or into a vein. It is given by a health care professional in a hospital or clinic setting. Talk to your pediatrician regarding the use of this medicine in children. Special care may be needed. Overdosage: If you think you have taken too much of this medicine contact a poison control center or emergency room at once. NOTE: This medicine is only for you. Do not share this medicine with others. What if I miss a dose? This does not apply. What may interact with this medication? capecitabine fluorouracil phenobarbital phenytoin primidone trimethoprim-sulfamethoxazole This list may not describe all possible interactions. Give your health care provider a list of all the medicines, herbs, non-prescription drugs, or dietary supplements you use. Also tell them if you smoke, drink alcohol, or use illegal drugs. Some items may interact with your medicine. What should I watch for while using this medication? Your condition will be monitored carefully while you are receiving this medicine. This medicine may increase the side effects of 5-fluorouracil, 5-FU. Tell your doctor or health care professional if you have diarrhea or mouth sores that do not get better or that get worse. What side effects may I notice from receiving this medication? Side effects that you should report to your doctor or health care professional as soon as possible: allergic reactions like skin rash, itching or hives, swelling of the face, lips, or tongue breathing problems fever, infection mouth sores unusual bleeding or bruising unusually weak or tired Side effects that usually do not require medical attention (report to your doctor or health care professional if they continue or are bothersome): constipation or diarrhea loss of appetite nausea, vomiting This list may not describe all possible side effects. Call your doctor for medical advice about side effects. You may report side effects to FDA at  1-800-FDA-1088. Where should I keep my medication? This drug is given in a hospital or clinic and will not be stored at home. NOTE: This sheet is a summary. It may not cover all possible information. If you have questions about this medicine, talk to your doctor, pharmacist, or health care provider.  2022 Elsevier/Gold Standard (2008-02-04 16:50:29)  Fluorouracil, 5-FU injection What is this medication? FLUOROURACIL, 5-FU (flure oh YOOR a sil) is a chemotherapy drug. It slows the growth of cancer cells. This medicine is used to treat many types of cancer like breast cancer, colon or rectal cancer, pancreatic cancer, and stomach cancer. This medicine may be used for other purposes; ask your health care provider or pharmacist if you have questions. COMMON BRAND NAME(S): Adrucil What should I tell  my care team before I take this medication? They need to know if you have any of these conditions: blood disorders dihydropyrimidine dehydrogenase (DPD) deficiency infection (especially a virus infection such as chickenpox, cold sores, or herpes) kidney disease liver disease malnourished, poor nutrition recent or ongoing radiation therapy an unusual or allergic reaction to fluorouracil, other chemotherapy, other medicines, foods, dyes, or preservatives pregnant or trying to get pregnant breast-feeding How should I use this medication? This drug is given as an infusion or injection into a vein. It is administered in a hospital or clinic by a specially trained health care professional. Talk to your pediatrician regarding the use of this medicine in children. Special care may be needed. Overdosage: If you think you have taken too much of this medicine contact a poison control center or emergency room at once. NOTE: This medicine is only for you. Do not share this medicine with others. What if I miss a dose? It is important not to miss your dose. Call your doctor or health care professional if you  are unable to keep an appointment. What may interact with this medication? Do not take this medicine with any of the following medications: live virus vaccines This medicine may also interact with the following medications: medicines that treat or prevent blood clots like warfarin, enoxaparin, and dalteparin This list may not describe all possible interactions. Give your health care provider a list of all the medicines, herbs, non-prescription drugs, or dietary supplements you use. Also tell them if you smoke, drink alcohol, or use illegal drugs. Some items may interact with your medicine. What should I watch for while using this medication? Visit your doctor for checks on your progress. This drug may make you feel generally unwell. This is not uncommon, as chemotherapy can affect healthy cells as well as cancer cells. Report any side effects. Continue your course of treatment even though you feel ill unless your doctor tells you to stop. In some cases, you may be given additional medicines to help with side effects. Follow all directions for their use. Call your doctor or health care professional for advice if you get a fever, chills or sore throat, or other symptoms of a cold or flu. Do not treat yourself. This drug decreases your body's ability to fight infections. Try to avoid being around people who are sick. This medicine may increase your risk to bruise or bleed. Call your doctor or health care professional if you notice any unusual bleeding. Be careful brushing and flossing your teeth or using a toothpick because you may get an infection or bleed more easily. If you have any dental work done, tell your dentist you are receiving this medicine. Avoid taking products that contain aspirin, acetaminophen, ibuprofen, naproxen, or ketoprofen unless instructed by your doctor. These medicines may hide a fever. Do not become pregnant while taking this medicine. Women should inform their doctor if they  wish to become pregnant or think they might be pregnant. There is a potential for serious side effects to an unborn child. Talk to your health care professional or pharmacist for more information. Do not breast-feed an infant while taking this medicine. Men should inform their doctor if they wish to father a child. This medicine may lower sperm counts. Do not treat diarrhea with over the counter products. Contact your doctor if you have diarrhea that lasts more than 2 days or if it is severe and watery. This medicine can make you more sensitive to the sun. Keep  out of the sun. If you cannot avoid being in the sun, wear protective clothing and use sunscreen. Do not use sun lamps or tanning beds/booths. What side effects may I notice from receiving this medication? Side effects that you should report to your doctor or health care professional as soon as possible: allergic reactions like skin rash, itching or hives, swelling of the face, lips, or tongue low blood counts - this medicine may decrease the number of white blood cells, red blood cells and platelets. You may be at increased risk for infections and bleeding. signs of infection - fever or chills, cough, sore throat, pain or difficulty passing urine signs of decreased platelets or bleeding - bruising, pinpoint red spots on the skin, black, tarry stools, blood in the urine signs of decreased red blood cells - unusually weak or tired, fainting spells, lightheadedness breathing problems changes in vision chest pain mouth sores nausea and vomiting pain, swelling, redness at site where injected pain, tingling, numbness in the hands or feet redness, swelling, or sores on hands or feet stomach pain unusual bleeding Side effects that usually do not require medical attention (report to your doctor or health care professional if they continue or are bothersome): changes in finger or toe nails diarrhea dry or itchy skin hair loss headache loss  of appetite sensitivity of eyes to the light stomach upset unusually teary eyes This list may not describe all possible side effects. Call your doctor for medical advice about side effects. You may report side effects to FDA at 1-800-FDA-1088. Where should I keep my medication? This drug is given in a hospital or clinic and will not be stored at home. NOTE: This sheet is a summary. It may not cover all possible information. If you have questions about this medicine, talk to your doctor, pharmacist, or health care provider.  2022 Elsevier/Gold Standard (2019-07-01 15:00:03)

## 2021-05-06 ENCOUNTER — Telehealth: Payer: Self-pay

## 2021-05-06 NOTE — Telephone Encounter (Signed)
Message relayed to Pt about CT results. And continuing current treatment. Pt verbalized understanding.

## 2021-05-06 NOTE — Telephone Encounter (Signed)
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

## 2021-05-07 ENCOUNTER — Inpatient Hospital Stay: Payer: BC Managed Care – PPO

## 2021-05-07 ENCOUNTER — Other Ambulatory Visit: Payer: Self-pay

## 2021-05-07 VITALS — BP 113/50 | HR 56 | Temp 98.2°F | Resp 17

## 2021-05-07 DIAGNOSIS — C182 Malignant neoplasm of ascending colon: Secondary | ICD-10-CM | POA: Diagnosis not present

## 2021-05-07 DIAGNOSIS — Z5112 Encounter for antineoplastic immunotherapy: Secondary | ICD-10-CM | POA: Diagnosis not present

## 2021-05-07 DIAGNOSIS — Z5189 Encounter for other specified aftercare: Secondary | ICD-10-CM | POA: Diagnosis not present

## 2021-05-07 DIAGNOSIS — Z5111 Encounter for antineoplastic chemotherapy: Secondary | ICD-10-CM | POA: Diagnosis not present

## 2021-05-07 DIAGNOSIS — Z86718 Personal history of other venous thrombosis and embolism: Secondary | ICD-10-CM | POA: Diagnosis not present

## 2021-05-07 DIAGNOSIS — K7460 Unspecified cirrhosis of liver: Secondary | ICD-10-CM | POA: Diagnosis not present

## 2021-05-07 DIAGNOSIS — C787 Secondary malignant neoplasm of liver and intrahepatic bile duct: Secondary | ICD-10-CM | POA: Diagnosis not present

## 2021-05-07 DIAGNOSIS — D500 Iron deficiency anemia secondary to blood loss (chronic): Secondary | ICD-10-CM | POA: Diagnosis not present

## 2021-05-07 DIAGNOSIS — Z7901 Long term (current) use of anticoagulants: Secondary | ICD-10-CM | POA: Diagnosis not present

## 2021-05-07 DIAGNOSIS — Z452 Encounter for adjustment and management of vascular access device: Secondary | ICD-10-CM | POA: Diagnosis not present

## 2021-05-07 MED ORDER — SODIUM CHLORIDE 0.9% FLUSH
10.0000 mL | INTRAVENOUS | Status: DC | PRN
  Administered 2021-05-07: 10 mL

## 2021-05-07 MED ORDER — HEPARIN SOD (PORK) LOCK FLUSH 100 UNIT/ML IV SOLN
500.0000 [IU] | Freq: Once | INTRAVENOUS | Status: AC | PRN
  Administered 2021-05-07: 500 [IU]

## 2021-05-07 MED ORDER — PEGFILGRASTIM-CBQV 6 MG/0.6ML ~~LOC~~ SOSY
PREFILLED_SYRINGE | SUBCUTANEOUS | Status: AC
  Filled 2021-05-07: qty 0.6

## 2021-05-07 MED ORDER — PEGFILGRASTIM-CBQV 6 MG/0.6ML ~~LOC~~ SOSY
6.0000 mg | PREFILLED_SYRINGE | Freq: Once | SUBCUTANEOUS | Status: AC
  Administered 2021-05-07: 6 mg via SUBCUTANEOUS

## 2021-05-07 NOTE — Patient Instructions (Signed)

## 2021-05-21 ENCOUNTER — Other Ambulatory Visit: Payer: Self-pay | Admitting: Oncology

## 2021-05-25 ENCOUNTER — Other Ambulatory Visit: Payer: Self-pay

## 2021-05-25 ENCOUNTER — Encounter: Payer: Self-pay | Admitting: Nurse Practitioner

## 2021-05-25 ENCOUNTER — Inpatient Hospital Stay: Payer: BC Managed Care – PPO

## 2021-05-25 ENCOUNTER — Inpatient Hospital Stay (HOSPITAL_BASED_OUTPATIENT_CLINIC_OR_DEPARTMENT_OTHER): Payer: BC Managed Care – PPO | Admitting: Nurse Practitioner

## 2021-05-25 ENCOUNTER — Inpatient Hospital Stay: Payer: BC Managed Care – PPO | Attending: Oncology

## 2021-05-25 VITALS — BP 135/51 | HR 81 | Temp 98.1°F | Resp 20 | Ht 67.0 in | Wt 125.4 lb

## 2021-05-25 DIAGNOSIS — Z5112 Encounter for antineoplastic immunotherapy: Secondary | ICD-10-CM | POA: Insufficient documentation

## 2021-05-25 DIAGNOSIS — Z452 Encounter for adjustment and management of vascular access device: Secondary | ICD-10-CM | POA: Diagnosis not present

## 2021-05-25 DIAGNOSIS — Z5189 Encounter for other specified aftercare: Secondary | ICD-10-CM | POA: Diagnosis not present

## 2021-05-25 DIAGNOSIS — D500 Iron deficiency anemia secondary to blood loss (chronic): Secondary | ICD-10-CM | POA: Diagnosis not present

## 2021-05-25 DIAGNOSIS — K7460 Unspecified cirrhosis of liver: Secondary | ICD-10-CM | POA: Diagnosis not present

## 2021-05-25 DIAGNOSIS — Z86718 Personal history of other venous thrombosis and embolism: Secondary | ICD-10-CM | POA: Diagnosis not present

## 2021-05-25 DIAGNOSIS — C787 Secondary malignant neoplasm of liver and intrahepatic bile duct: Secondary | ICD-10-CM | POA: Diagnosis not present

## 2021-05-25 DIAGNOSIS — Z5111 Encounter for antineoplastic chemotherapy: Secondary | ICD-10-CM | POA: Insufficient documentation

## 2021-05-25 DIAGNOSIS — C182 Malignant neoplasm of ascending colon: Secondary | ICD-10-CM | POA: Diagnosis not present

## 2021-05-25 DIAGNOSIS — Z7901 Long term (current) use of anticoagulants: Secondary | ICD-10-CM | POA: Insufficient documentation

## 2021-05-25 DIAGNOSIS — Z23 Encounter for immunization: Secondary | ICD-10-CM

## 2021-05-25 LAB — CBC WITH DIFFERENTIAL (CANCER CENTER ONLY)
Abs Immature Granulocytes: 0.03 10*3/uL (ref 0.00–0.07)
Basophils Absolute: 0.1 10*3/uL (ref 0.0–0.1)
Basophils Relative: 1 %
Eosinophils Absolute: 0.1 10*3/uL (ref 0.0–0.5)
Eosinophils Relative: 2 %
HCT: 30.9 % — ABNORMAL LOW (ref 36.0–46.0)
Hemoglobin: 9.8 g/dL — ABNORMAL LOW (ref 12.0–15.0)
Immature Granulocytes: 0 %
Lymphocytes Relative: 13 %
Lymphs Abs: 0.9 10*3/uL (ref 0.7–4.0)
MCH: 31.4 pg (ref 26.0–34.0)
MCHC: 31.7 g/dL (ref 30.0–36.0)
MCV: 99 fL (ref 80.0–100.0)
Monocytes Absolute: 0.8 10*3/uL (ref 0.1–1.0)
Monocytes Relative: 11 %
Neutro Abs: 5.1 10*3/uL (ref 1.7–7.7)
Neutrophils Relative %: 73 %
Platelet Count: 114 10*3/uL — ABNORMAL LOW (ref 150–400)
RBC: 3.12 MIL/uL — ABNORMAL LOW (ref 3.87–5.11)
RDW: 16.3 % — ABNORMAL HIGH (ref 11.5–15.5)
WBC Count: 7 10*3/uL (ref 4.0–10.5)
nRBC: 0 % (ref 0.0–0.2)

## 2021-05-25 LAB — CMP (CANCER CENTER ONLY)
ALT: 24 U/L (ref 0–44)
AST: 30 U/L (ref 15–41)
Albumin: 3.5 g/dL (ref 3.5–5.0)
Alkaline Phosphatase: 184 U/L — ABNORMAL HIGH (ref 38–126)
Anion gap: 6 (ref 5–15)
BUN: 21 mg/dL (ref 8–23)
CO2: 25 mmol/L (ref 22–32)
Calcium: 8.9 mg/dL (ref 8.9–10.3)
Chloride: 110 mmol/L (ref 98–111)
Creatinine: 0.61 mg/dL (ref 0.44–1.00)
GFR, Estimated: >60 mL/min (ref >60)
Glucose, Bld: 80 mg/dL (ref 70–99)
Potassium: 4 mmol/L (ref 3.5–5.1)
Sodium: 141 mmol/L (ref 135–145)
Total Bilirubin: 0.3 mg/dL (ref 0.3–1.2)
Total Protein: 5.9 g/dL — ABNORMAL LOW (ref 6.5–8.1)

## 2021-05-25 LAB — TOTAL PROTEIN, URINE DIPSTICK: Protein, ur: NEGATIVE mg/dL

## 2021-05-25 LAB — CEA (ACCESS): CEA (CHCC): 22.82 ng/mL — ABNORMAL HIGH (ref 0.00–5.00)

## 2021-05-25 MED ORDER — PALONOSETRON HCL INJECTION 0.25 MG/5ML
0.2500 mg | Freq: Once | INTRAVENOUS | Status: AC
  Administered 2021-05-25: 0.25 mg via INTRAVENOUS
  Filled 2021-05-25: qty 5

## 2021-05-25 MED ORDER — ATROPINE SULFATE 1 MG/ML IV SOLN
0.5000 mg | Freq: Once | INTRAVENOUS | Status: AC | PRN
  Administered 2021-05-25: 0.5 mg via INTRAVENOUS
  Filled 2021-05-25: qty 1

## 2021-05-25 MED ORDER — SODIUM CHLORIDE 0.9 % IV SOLN: Freq: Once | INTRAVENOUS | Status: AC

## 2021-05-25 MED ORDER — SODIUM CHLORIDE 0.9 % IV SOLN
140.0000 mg/m2 | Freq: Once | INTRAVENOUS | Status: AC
  Administered 2021-05-25: 220 mg via INTRAVENOUS
  Filled 2021-05-25: qty 11

## 2021-05-25 MED ORDER — INFLUENZA VAC A&B SA ADJ QUAD 0.5 ML IM PRSY
0.5000 mL | PREFILLED_SYRINGE | Freq: Once | INTRAMUSCULAR | Status: AC
  Administered 2021-05-25: 0.5 mL via INTRAMUSCULAR
  Filled 2021-05-25: qty 0.5

## 2021-05-25 MED ORDER — SODIUM CHLORIDE 0.9 % IV SOLN
1575.0000 mg/m2 | INTRAVENOUS | Status: DC
  Administered 2021-05-25: 2500 mg via INTRAVENOUS
  Filled 2021-05-25: qty 50

## 2021-05-25 MED ORDER — SODIUM CHLORIDE 0.9 % IV SOLN
10.0000 mg | Freq: Once | INTRAVENOUS | Status: AC
  Administered 2021-05-25: 10 mg via INTRAVENOUS
  Filled 2021-05-25: qty 10

## 2021-05-25 MED ORDER — SODIUM CHLORIDE 0.9% FLUSH
10.0000 mL | INTRAVENOUS | Status: DC | PRN
  Administered 2021-05-25: 10 mL

## 2021-05-25 MED ORDER — SODIUM CHLORIDE 0.9 % IV SOLN
200.0000 mg/m2 | Freq: Once | INTRAVENOUS | Status: AC
  Administered 2021-05-25: 318 mg via INTRAVENOUS
  Filled 2021-05-25: qty 15.9

## 2021-05-25 MED ORDER — SODIUM CHLORIDE 0.9 % IV SOLN
5.2000 mg/kg | Freq: Once | INTRAVENOUS | Status: AC
  Administered 2021-05-25: 300 mg via INTRAVENOUS
  Filled 2021-05-25: qty 12

## 2021-05-25 NOTE — Patient Instructions (Signed)
Implanted Port Home Guide An implanted port is a device that is placed under the skin. It is usually placed in the chest. The device can be used to give IV medicine, to take blood, or for dialysis. You may have an implanted port if: You need IV medicine that would be irritating to the small veins in your hands or arms. You need IV medicines, such as antibiotics, for a long period of time. You need IV nutrition for a long period of time. You need dialysis. When you have a port, your health care provider can choose to use the port instead of veins in your arms for these procedures. You may have fewer limitations when using a port than you would if you used other types of long-term IVs, and you will likely be able to return to normal activities after your incision heals. An implanted port has two main parts: Reservoir. The reservoir is the part where a needle is inserted to give medicines or draw blood. The reservoir is round. After it is placed, it appears as a small, raised area under your skin. Catheter. The catheter is a thin, flexible tube that connects the reservoir to a vein. Medicine that is inserted into the reservoir goes into the catheter and then into the vein. How is my port accessed? To access your port: A numbing cream may be placed on the skin over the port site. Your health care provider will put on a mask and sterile gloves. The skin over your port will be cleaned carefully with a germ-killing soap and allowed to dry. Your health care provider will gently pinch the port and insert a needle into it. Your health care provider will check for a blood return to make sure the port is in the vein and is not clogged. If your port needs to remain accessed to get medicine continuously (constant infusion), your health care provider will place a clear bandage (dressing) over the needle site. The dressing and needle will need to be changed every week, or as told by your health care provider. What  is flushing? Flushing helps keep the port from getting clogged. Follow instructions from your health care provider about how and when to flush the port. Ports are usually flushed with saline solution or a medicine called heparin. The need for flushing will depend on how the port is used: If the port is only used from time to time to give medicines or draw blood, the port may need to be flushed: Before and after medicines have been given. Before and after blood has been drawn. As part of routine maintenance. Flushing may be recommended every 4-6 weeks. If a constant infusion is running, the port may not need to be flushed. Throw away any syringes in a disposal container that is meant for sharp items (sharps container). You can buy a sharps container from a pharmacy, or you can make one by using an empty hard plastic bottle with a cover. How long will my port stay implanted? The port can stay in for as long as your health care provider thinks it is needed. When it is time for the port to come out, a surgery will be done to remove it. The surgery will be similar to the procedure that was done to put the port in. Follow these instructions at home:  Flush your port as told by your health care provider. If you need an infusion over several days, follow instructions from your health care provider about how   to take care of your port site. Make sure you: Wash your hands with soap and water before you change your dressing. If soap and water are not available, use alcohol-based hand sanitizer. Change your dressing as told by your health care provider. Place any used dressings or infusion bags into a plastic bag. Throw that bag in the trash. Keep the dressing that covers the needle clean and dry. Do not get it wet. Do not use scissors or sharp objects near the tube. Keep the tube clamped, unless it is being used. Check your port site every day for signs of infection. Check for: Redness, swelling, or  pain. Fluid or blood. Pus or a bad smell. Protect the skin around the port site. Avoid wearing bra straps that rub or irritate the site. Protect the skin around your port from seat belts. Place a soft pad over your chest if needed. Bathe or shower as told by your health care provider. The site may get wet as long as you are not actively receiving an infusion. Return to your normal activities as told by your health care provider. Ask your health care provider what activities are safe for you. Carry a medical alert card or wear a medical alert bracelet at all times. This will let health care providers know that you have an implanted port in case of an emergency. Get help right away if: You have redness, swelling, or pain at the port site. You have fluid or blood coming from your port site. You have pus or a bad smell coming from the port site. You have a fever. Summary Implanted ports are usually placed in the chest for long-term IV access. Follow instructions from your health care provider about flushing the port and changing bandages (dressings). Take care of the area around your port by avoiding clothing that puts pressure on the area, and by watching for signs of infection. Protect the skin around your port from seat belts. Place a soft pad over your chest if needed. Get help right away if you have a fever or you have redness, swelling, pain, drainage, or a bad smell at the port site. This information is not intended to replace advice given to you by your health care provider. Make sure you discuss any questions you have with your health care provider. Document Revised: 10/20/2020 Document Reviewed: 12/15/2019 [employer] Patient Education  2022 [employer]  

## 2021-05-25 NOTE — Patient Instructions (Signed)
[facility]  Discharge Instructions: Thank you for choosing [facility] to provide your oncology and hematology care.   If you have a lab appointment with the [facility], please go directly to the [facility] and check in at the registration area.   Wear comfortable clothing and clothing appropriate for easy access to any Portacath or PICC line.   We strive to give you quality time with your provider. You may need to reschedule your appointment if you arrive late (15 or more minutes).  Arriving late affects you and other patients whose appointments are after yours.  Also, if you miss three or more appointments without notifying the office, you may be dismissed from the clinic at the provider's discretion.      For prescription refill requests, have your pharmacy contact our office and allow 72 hours for refills to be completed.   Today you received the following chemotherapy and/or immunotherapy agents bevacizumab-bvzr, irinotecan, leucovorin, fluorouracil   To help prevent nausea and vomiting after your treatment, we encourage you to take your nausea medication as directed.  BELOW ARE SYMPTOMS THAT SHOULD BE REPORTED IMMEDIATELY: *FEVER GREATER THAN 100.4 F (38 C) OR HIGHER *CHILLS OR SWEATING *NAUSEA AND VOMITING THAT IS NOT CONTROLLED WITH YOUR NAUSEA MEDICATION *UNUSUAL SHORTNESS OF BREATH *UNUSUAL BRUISING OR BLEEDING *URINARY PROBLEMS (pain or burning when urinating, or frequent urination) *BOWEL PROBLEMS (unusual diarrhea, constipation, pain near the anus) TENDERNESS IN MOUTH AND THROAT WITH OR WITHOUT PRESENCE OF ULCERS (sore throat, sores in mouth, or a toothache) UNUSUAL RASH, SWELLING OR PAIN  UNUSUAL VAGINAL DISCHARGE OR ITCHING   Items with * indicate a potential emergency and should be followed up as soon as possible or go to the Emergency Department if any problems should occur.  Please show the CHEMOTHERAPY ALERT CARD or  IMMUNOTHERAPY ALERT CARD at check-in to the Emergency Department and triage nurse.  Should you have questions after your visit or need to cancel or reschedule your appointment, please contact [facility]  Dept: [phone]  and follow the prompts.  Office hours are 8:00 a.m. to 4:30 p.m. Monday - Friday. Please note that voicemails left after 4:00 p.m. may not be returned until the following business day.  We are closed weekends and major holidays. You have access to a nurse at all times for urgent questions. Please call the main number to the clinic Dept: [phone] and follow the prompts.   For any non-urgent questions, you may also contact your provider using MyChart. We now offer e-Visits for anyone 73 and older to request care online for non-urgent symptoms. For details visit [email or web address].   Also download the MyChart app! Go to the app store, search "MyChart", open the app, select [facility], and log in with your MyChart username and password.  Due to Covid, a mask is required upon entering the hospital/clinic. If you do not have a mask, one will be given to you upon arrival. For doctor visits, patients may have 1 support person aged 36 or older with them. For treatment visits, patients cannot have anyone with them due to current Covid guidelines and our immunocompromised population.    Bevacizumab injection What is this medication? BEVACIZUMAB (be va SIZ yoo mab) is a monoclonal antibody. It is used to treat many types of cancer. This medicine may be used for other purposes; ask your health care provider or pharmacist if you have questions. COMMON BRAND NAME(S): Avastin, MVASI,  [name] What should I tell my care team before I take this medication? They need to know if you have any of these conditions: diabetes heart disease high blood pressure history of coughing up blood prior anthracycline chemotherapy (e.g., doxorubicin, daunorubicin,  epirubicin) recent or ongoing radiation therapy recent or planning to have surgery stroke an unusual or allergic reaction to bevacizumab, hamster proteins, mouse proteins, other medicines, foods, dyes, or preservatives pregnant or trying to get pregnant breast-feeding How should I use this medication? This medicine is for infusion into a vein. It is given by a health care professional in a hospital or clinic setting. Talk to your pediatrician regarding the use of this medicine in children. Special care may be needed. Overdosage: If you think you have taken too much of this medicine contact a poison control center or emergency room at once. NOTE: This medicine is only for you. Do not share this medicine with others. What if I miss a dose? It is important not to miss your dose. Call your doctor or health care professional if you are unable to keep an appointment. What may interact with this medication? Interactions are not expected. This list may not describe all possible interactions. Give your health care provider a list of all the medicines, herbs, non-prescription drugs, or dietary supplements you use. Also tell them if you smoke, drink alcohol, or use illegal drugs. Some items may interact with your medicine. What should I watch for while using this medication? Your condition will be monitored carefully while you are receiving this medicine. You will need important blood work and urine testing done while you are taking this medicine. This medicine may increase your risk to bruise or bleed. Call your doctor or health care professional if you notice any unusual bleeding. Before having surgery, talk to your health care provider to make sure it is ok. This drug can increase the risk of poor healing of your surgical site or wound. You will need to stop this drug for 28 days before surgery. After surgery, wait at least 28 days before restarting this drug. Make sure the surgical site or wound is  healed enough before restarting this drug. Talk to your health care provider if questions. Do not become pregnant while taking this medicine or for 6 months after stopping it. Women should inform their doctor if they wish to become pregnant or think they might be pregnant. There is a potential for serious side effects to an unborn child. Talk to your health care professional or pharmacist for more information. Do not breast-feed an infant while taking this medicine and for 6 months after the last dose. This medicine has caused ovarian failure in some women. This medicine may interfere with the ability to have a child. You should talk to your doctor or health care professional if you are concerned about your fertility. What side effects may I notice from receiving this medication? Side effects that you should report to your doctor or health care professional as soon as possible: allergic reactions like skin rash, itching or hives, swelling of the face, lips, or tongue chest pain or chest tightness chills coughing up blood high fever seizures severe constipation signs and symptoms of bleeding such as bloody or black, tarry stools; red or dark-Loconte urine; spitting up blood or Ahrendt material that looks like coffee grounds; red spots on the skin; unusual bruising or bleeding from the eye, gums, or nose signs and symptoms of a blood clot such as breathing problems; chest  pain; severe, sudden headache; pain, swelling, warmth in the leg signs and symptoms of a stroke like changes in vision; confusion; trouble speaking or understanding; severe headaches; sudden numbness or weakness of the face, arm or leg; trouble walking; dizziness; loss of balance or coordination stomach pain sweating swelling of legs or ankles vomiting weight gain Side effects that usually do not require medical attention (report to your doctor or health care professional if they continue or are bothersome): back pain changes in  taste decreased appetite dry skin nausea tiredness This list may not describe all possible side effects. Call your doctor for medical advice about side effects. You may report side effects to FDA at 1-800-FDA-1088. Where should I keep my medication? This drug is given in a hospital or clinic and will not be stored at home. NOTE: This sheet is a summary. It may not cover all possible information. If you have questions about this medicine, talk to your doctor, pharmacist, or health care provider.  2022 Elsevier/Gold Standard (2019-05-28 10:50:46)  Irinotecan injection What is this medication? IRINOTECAN (ir in oh TEE kan ) is a chemotherapy drug. It is used to treat colon and rectal cancer. This medicine may be used for other purposes; ask your health care provider or pharmacist if you have questions. COMMON BRAND NAME(S): Camptosar What should I tell my care team before I take this medication? They need to know if you have any of these conditions: dehydration diarrhea infection (especially a virus infection such as chickenpox, cold sores, or herpes) liver disease low blood counts, like low white cell, platelet, or red cell counts low levels of calcium, magnesium, or potassium in the blood recent or ongoing radiation therapy an unusual or allergic reaction to irinotecan, other medicines, foods, dyes, or preservatives pregnant or trying to get pregnant breast-feeding How should I use this medication? This drug is given as an infusion into a vein. It is administered in a hospital or clinic by a specially trained health care professional. Talk to your pediatrician regarding the use of this medicine in children. Special care may be needed. Overdosage: If you think you have taken too much of this medicine contact a poison control center or emergency room at once. NOTE: This medicine is only for you. Do not share this medicine with others. What if I miss a dose? It is important not to miss  your dose. Call your doctor or health care professional if you are unable to keep an appointment. What may interact with this medication? Do not take this medicine with any of the following medications: cobicistat itraconazole This medicine may interact with the following medications: antiviral medicines for HIV or AIDS certain antibiotics like rifampin or rifabutin certain medicines for fungal infections like ketoconazole, posaconazole, and voriconazole certain medicines for seizures like carbamazepine, phenobarbital, phenotoin clarithromycin gemfibrozil nefazodone St. John's Wort This list may not describe all possible interactions. Give your health care provider a list of all the medicines, herbs, non-prescription drugs, or dietary supplements you use. Also tell them if you smoke, drink alcohol, or use illegal drugs. Some items may interact with your medicine. What should I watch for while using this medication? Your condition will be monitored carefully while you are receiving this medicine. You will need important blood work done while you are taking this medicine. This drug may make you feel generally unwell. This is not uncommon, as chemotherapy can affect healthy cells as well as cancer cells. Report any side effects. Continue your course of treatment  even though you feel ill unless your doctor tells you to stop. In some cases, you may be given additional medicines to help with side effects. Follow all directions for their use. You may get drowsy or dizzy. Do not drive, use machinery, or do anything that needs mental alertness until you know how this medicine affects you. Do not stand or sit up quickly, especially if you are an older patient. This reduces the risk of dizzy or fainting spells. Call your health care professional for advice if you get a fever, chills, or sore throat, or other symptoms of a cold or flu. Do not treat yourself. This medicine decreases your body's ability to  fight infections. Try to avoid being around people who are sick. Avoid taking products that contain aspirin, acetaminophen, ibuprofen, naproxen, or ketoprofen unless instructed by your doctor. These medicines may hide a fever. This medicine may increase your risk to bruise or bleed. Call your doctor or health care professional if you notice any unusual bleeding. Be careful brushing and flossing your teeth or using a toothpick because you may get an infection or bleed more easily. If you have any dental work done, tell your dentist you are receiving this medicine. Do not become pregnant while taking this medicine or for 6 months after stopping it. Women should inform their health care professional if they wish to become pregnant or think they might be pregnant. Men should not father a child while taking this medicine and for 3 months after stopping it. There is potential for serious side effects to an unborn child. Talk to your health care professional for more information. Do not breast-feed an infant while taking this medicine or for 7 days after stopping it. This medicine has caused ovarian failure in some women. This medicine may make it more difficult to get pregnant. Talk to your health care professional if you are concerned about your fertility. This medicine has caused decreased sperm counts in some men. This may make it more difficult to father a child. Talk to your health care professional if you are concerned about your fertility. What side effects may I notice from receiving this medication? Side effects that you should report to your doctor or health care professional as soon as possible: allergic reactions like skin rash, itching or hives, swelling of the face, lips, or tongue chest pain diarrhea flushing, runny nose, sweating during infusion low blood counts - this medicine may decrease the number of white blood cells, red blood cells and platelets. You may be at increased risk for  infections and bleeding. nausea, vomiting pain, swelling, warmth in the leg signs of decreased platelets or bleeding - bruising, pinpoint red spots on the skin, black, tarry stools, blood in the urine signs of infection - fever or chills, cough, sore throat, pain or difficulty passing urine signs of decreased red blood cells - unusually weak or tired, fainting spells, lightheadedness Side effects that usually do not require medical attention (report to your doctor or health care professional if they continue or are bothersome): constipation hair loss headache loss of appetite mouth sores stomach pain This list may not describe all possible side effects. Call your doctor for medical advice about side effects. You may report side effects to FDA at 1-800-FDA-1088. Where should I keep my medication? This drug is given in a hospital or clinic and will not be stored at home. NOTE: This sheet is a summary. It may not cover all possible information. If you have questions  about this medicine, talk to your doctor, pharmacist, or health care provider.  2022 Elsevier/Gold Standard (2019-07-01 17:46:13)  Leucovorin injection What is this medication? LEUCOVORIN (loo koe VOR in) is used to prevent or treat the harmful effects of some medicines. This medicine is used to treat anemia caused by a low amount of folic acid in the body. It is also used with 5-fluorouracil (5-FU) to treat colon cancer. This medicine may be used for other purposes; ask your health care provider or pharmacist if you have questions. What should I tell my care team before I take this medication? They need to know if you have any of these conditions: anemia from low levels of vitamin B-12 in the blood an unusual or allergic reaction to leucovorin, folic acid, other medicines, foods, dyes, or preservatives pregnant or trying to get pregnant breast-feeding How should I use this medication? This medicine is for injection into a  muscle or into a vein. It is given by a health care professional in a hospital or clinic setting. Talk to your pediatrician regarding the use of this medicine in children. Special care may be needed. Overdosage: If you think you have taken too much of this medicine contact a poison control center or emergency room at once. NOTE: This medicine is only for you. Do not share this medicine with others. What if I miss a dose? This does not apply. What may interact with this medication? capecitabine fluorouracil phenobarbital phenytoin primidone trimethoprim-sulfamethoxazole This list may not describe all possible interactions. Give your health care provider a list of all the medicines, herbs, non-prescription drugs, or dietary supplements you use. Also tell them if you smoke, drink alcohol, or use illegal drugs. Some items may interact with your medicine. What should I watch for while using this medication? Your condition will be monitored carefully while you are receiving this medicine. This medicine may increase the side effects of 5-fluorouracil, 5-FU. Tell your doctor or health care professional if you have diarrhea or mouth sores that do not get better or that get worse. What side effects may I notice from receiving this medication? Side effects that you should report to your doctor or health care professional as soon as possible: allergic reactions like skin rash, itching or hives, swelling of the face, lips, or tongue breathing problems fever, infection mouth sores unusual bleeding or bruising unusually weak or tired Side effects that usually do not require medical attention (report to your doctor or health care professional if they continue or are bothersome): constipation or diarrhea loss of appetite nausea, vomiting This list may not describe all possible side effects. Call your doctor for medical advice about side effects. You may report side effects to FDA at  1-800-FDA-1088. Where should I keep my medication? This drug is given in a hospital or clinic and will not be stored at home. NOTE: This sheet is a summary. It may not cover all possible information. If you have questions about this medicine, talk to your doctor, pharmacist, or health care provider.  2022 Elsevier/Gold Standard (2008-02-04 16:50:29)  Fluorouracil, 5-FU injection What is this medication? FLUOROURACIL, 5-FU (flure oh YOOR a sil) is a chemotherapy drug. It slows the growth of cancer cells. This medicine is used to treat many types of cancer like breast cancer, colon or rectal cancer, pancreatic cancer, and stomach cancer. This medicine may be used for other purposes; ask your health care provider or pharmacist if you have questions. COMMON BRAND NAME(S): Adrucil What should I tell  my care team before I take this medication? They need to know if you have any of these conditions: blood disorders dihydropyrimidine dehydrogenase (DPD) deficiency infection (especially a virus infection such as chickenpox, cold sores, or herpes) kidney disease liver disease malnourished, poor nutrition recent or ongoing radiation therapy an unusual or allergic reaction to fluorouracil, other chemotherapy, other medicines, foods, dyes, or preservatives pregnant or trying to get pregnant breast-feeding How should I use this medication? This drug is given as an infusion or injection into a vein. It is administered in a hospital or clinic by a specially trained health care professional. Talk to your pediatrician regarding the use of this medicine in children. Special care may be needed. Overdosage: If you think you have taken too much of this medicine contact a poison control center or emergency room at once. NOTE: This medicine is only for you. Do not share this medicine with others. What if I miss a dose? It is important not to miss your dose. Call your doctor or health care professional if you  are unable to keep an appointment. What may interact with this medication? Do not take this medicine with any of the following medications: live virus vaccines This medicine may also interact with the following medications: medicines that treat or prevent blood clots like warfarin, enoxaparin, and dalteparin This list may not describe all possible interactions. Give your health care provider a list of all the medicines, herbs, non-prescription drugs, or dietary supplements you use. Also tell them if you smoke, drink alcohol, or use illegal drugs. Some items may interact with your medicine. What should I watch for while using this medication? Visit your doctor for checks on your progress. This drug may make you feel generally unwell. This is not uncommon, as chemotherapy can affect healthy cells as well as cancer cells. Report any side effects. Continue your course of treatment even though you feel ill unless your doctor tells you to stop. In some cases, you may be given additional medicines to help with side effects. Follow all directions for their use. Call your doctor or health care professional for advice if you get a fever, chills or sore throat, or other symptoms of a cold or flu. Do not treat yourself. This drug decreases your body's ability to fight infections. Try to avoid being around people who are sick. This medicine may increase your risk to bruise or bleed. Call your doctor or health care professional if you notice any unusual bleeding. Be careful brushing and flossing your teeth or using a toothpick because you may get an infection or bleed more easily. If you have any dental work done, tell your dentist you are receiving this medicine. Avoid taking products that contain aspirin, acetaminophen, ibuprofen, naproxen, or ketoprofen unless instructed by your doctor. These medicines may hide a fever. Do not become pregnant while taking this medicine. Women should inform their doctor if they  wish to become pregnant or think they might be pregnant. There is a potential for serious side effects to an unborn child. Talk to your health care professional or pharmacist for more information. Do not breast-feed an infant while taking this medicine. Men should inform their doctor if they wish to father a child. This medicine may lower sperm counts. Do not treat diarrhea with over the counter products. Contact your doctor if you have diarrhea that lasts more than 2 days or if it is severe and watery. This medicine can make you more sensitive to the sun. Keep  out of the sun. If you cannot avoid being in the sun, wear protective clothing and use sunscreen. Do not use sun lamps or tanning beds/booths. What side effects may I notice from receiving this medication? Side effects that you should report to your doctor or health care professional as soon as possible: allergic reactions like skin rash, itching or hives, swelling of the face, lips, or tongue low blood counts - this medicine may decrease the number of white blood cells, red blood cells and platelets. You may be at increased risk for infections and bleeding. signs of infection - fever or chills, cough, sore throat, pain or difficulty passing urine signs of decreased platelets or bleeding - bruising, pinpoint red spots on the skin, black, tarry stools, blood in the urine signs of decreased red blood cells - unusually weak or tired, fainting spells, lightheadedness breathing problems changes in vision chest pain mouth sores nausea and vomiting pain, swelling, redness at site where injected pain, tingling, numbness in the hands or feet redness, swelling, or sores on hands or feet stomach pain unusual bleeding Side effects that usually do not require medical attention (report to your doctor or health care professional if they continue or are bothersome): changes in finger or toe nails diarrhea dry or itchy skin hair loss headache loss  of appetite sensitivity of eyes to the light stomach upset unusually teary eyes This list may not describe all possible side effects. Call your doctor for medical advice about side effects. You may report side effects to FDA at 1-800-FDA-1088. Where should I keep my medication? This drug is given in a hospital or clinic and will not be stored at home. NOTE: This sheet is a summary. It may not cover all possible information. If you have questions about this medicine, talk to your doctor, pharmacist, or health care provider.  2022 Elsevier/Gold Standard (2019-07-01 15:00:03)  Influenza Virus Vaccine injection What is this medication? INFLUENZA VIRUS VACCINE (in floo EN zuh VAHY ruhs vak SEEN) helps to reduce the risk of getting influenza also known as the flu. The vaccine only helps protect you against some strains of the flu. This medicine may be used for other purposes; ask your health care provider or pharmacist if you have questions. COMMON BRAND NAME(S): Afluria, Afluria Quadrivalent, Agriflu, Alfuria, FLUAD, FLUAD Quadrivalent, Fluarix, Fluarix Quadrivalent, Flublok, Flublok Quadrivalent, FLUCELVAX, FLUCELVAX Quadrivalent, Flulaval, Flulaval Quadrivalent, Fluvirin, Fluzone, Fluzone High-Dose, Fluzone Intradermal, Fluzone Quadrivalent What should I tell my care team before I take this medication? They need to know if you have any of these conditions: bleeding disorder like hemophilia fever or infection Guillain-Barre syndrome or other neurological problems immune system problems infection with the human immunodeficiency virus (HIV) or AIDS low blood platelet counts multiple sclerosis an unusual or allergic reaction to influenza virus vaccine, latex, other medicines, foods, dyes, or preservatives. Different brands of vaccines contain different allergens. Some may contain latex or eggs. Talk to your doctor about your allergies to make sure that you get the right vaccine. pregnant or trying  to get pregnant breast-feeding How should I use this medication? This vaccine is for injection into a muscle or under the skin. It is given by a health care professional. A copy of Vaccine Information Statements will be given before each vaccination. Read this sheet carefully each time. The sheet may change frequently. Talk to your healthcare provider to see which vaccines are right for you. Some vaccines should not be used in all age groups. Overdosage: If you think  you have taken too much of this medicine contact a poison control center or emergency room at once. NOTE: This medicine is only for you. Do not share this medicine with others. What if I miss a dose? This does not apply. What may interact with this medication? chemotherapy or radiation therapy medicines that lower your immune system like etanercept, anakinra, infliximab, and adalimumab medicines that treat or prevent blood clots like warfarin phenytoin steroid medicines like prednisone or cortisone theophylline vaccines This list may not describe all possible interactions. Give your health care provider a list of all the medicines, herbs, non-prescription drugs, or dietary supplements you use. Also tell them if you smoke, drink alcohol, or use illegal drugs. Some items may interact with your medicine. What should I watch for while using this medication? Report any side effects that do not go away within 3 days to your doctor or health care professional. Call your health care provider if any unusual symptoms occur within 6 weeks of receiving this vaccine. You may still catch the flu, but the illness is not usually as bad. You cannot get the flu from the vaccine. The vaccine will not protect against colds or other illnesses that may cause fever. The vaccine is needed every year. What side effects may I notice from receiving this medication? Side effects that you should report to your doctor or health care professional as soon as  possible: allergic reactions like skin rash, itching or hives, swelling of the face, lips, or tongue Side effects that usually do not require medical attention (report to your doctor or health care professional if they continue or are bothersome): fever headache muscle aches and pains pain, tenderness, redness, or swelling at the injection site tiredness Side effects that you should report to your doctor or health care professional as soon as possible: allergic reactions like skin rash, itching or hives, swelling of the face, lips, or tongue Side effects that usually do not require medical attention (report to your doctor or health care professional if they continue or are bothersome): fever headache muscle aches and pains pain, tenderness, redness, or swelling at the injection site tiredness This list may not describe all possible side effects. Call your doctor for medical advice about side effects. You may report side effects to FDA at 1-800-FDA-1088. Where should I keep my medication? The vaccine will be given by a health care professional in a clinic, pharmacy, doctor's office, or other health care setting. You will not be given vaccine doses to store at home. NOTE: This sheet is a summary. It may not cover all possible information. If you have questions about this medicine, talk to your doctor, pharmacist, or health care provider.  2022 Elsevier/Gold Standard (2020-04-06 19:49:22)

## 2021-05-25 NOTE — Progress Notes (Signed)
Patient presents for treatment. RN assessment completed along with the following:  Labs/vitals reviewed - Yes, and within treatment parameters.   Weight within 10% of previous measurement - Yes Oncology Treatment Attestation completed for current therapy- Yes, on date 03/25/20 Informed consent completed and reflects current therapy/intent - Yes, on date 04/21/20             Provider progress note reviewed - Yes, today's provider note was reviewed. Treatment/Antibody/Supportive plan reviewed - Yes, and there are no adjustments needed for today's treatment. S&H and other orders reviewed - Yes, and there are no additional orders identified. Previous treatment date reviewed - Yes, and the appropriate amount of time has elapsed between treatments. Clinic Hand Off Received from - Ned Card, NP  Patient to proceed with treatment.

## 2021-05-25 NOTE — Progress Notes (Signed)
[facility] OFFICE PROGRESS NOTE   Diagnosis: Colon cancer  INTERVAL HISTORY:   Stacy Burns returns as scheduled.  She completed another cycle of FOLFIRI/bevacizumab 05/05/2021.  She denies nausea/vomiting.  No mouth sores.  No diarrhea.  No bleeding.  She denies fever, cough, shortness of breath.  She denies pain.  Objective:  Vital signs in last 24 hours:  Blood pressure (!) 135/51, pulse 81, temperature 98.1 F (36.7 C), temperature source Oral, resp. rate 20, height 5' 7" (1.702 m), weight 125 lb 6.4 oz (56.9 kg), SpO2 98 %.    HEENT: No thrush or ulcers. Resp: Lungs clear bilaterally. Cardio: Regular rate and rhythm. GI: Abdomen soft and nontender.  Liver edge is palpable. Vascular: No leg edema. Skin: Ecchymoses bilateral dorsum hands. Port-A-Cath without erythema.  Lab Results:  Lab Results  Component Value Date   WBC 7.0 05/25/2021   HGB 9.8 (L) 05/25/2021   HCT 30.9 (L) 05/25/2021   MCV 99.0 05/25/2021   PLT 114 (L) 05/25/2021   NEUTROABS 5.1 05/25/2021    Imaging:  No results found.  Medications: I have reviewed the patient's current medications.  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  Cirrhosis.  Innumerable liver lesions. Colonoscopy 12/18/2019-ulcerated partially obstructing large mass in the mid ascending colon.  Biopsy invasive adenocarcinoma; preserved expression major MMR proteins Upper endoscopy 12/18/2019-normal esophagus, stomach, examined duodenum CEA 12/19/2019-8,923 Biopsy liver lesion 12/23/2019-adenocarcinoma Foundation 1-K-ras wild-type; tumor mutation burden greater than or equal to 10; microsatellite stable; BRAF V600E Cycle 1 FOLFOX 01/01/2020 Cycle 2 FOLFOX 01/15/2020  Cycle 3 FOLFOX 01/29/2020, Udenyca added Cycle 4 FOLFOX 02/12/2020, Udenyca held Cycle 5 FOLFOX 02/26/2020, Udenyca Cycle 6 FOLFOX 03/11/2020,  Udenyca held CT abdomen/pelvis 03/16/2020-stable diffuse liver lesions, stable strictured appearing ascending colon, possible pericolonic implant, vague left lower lobe nodule Cycle 1 FOLFIRI/Avastin 04/06/2020 Cycle 2 FOLFIRI/Avastin 04/21/2020 Cycle 3 FOLFIRI/Avastin 05/06/2020 Cycle 4 FOLFIRI/Avastin 05/20/2020 Cycle 5 FOLFIRI/Avastin 06/03/2020 CTs 06/15/2020-mild to moderate improvement in hepatic metastasis.  No new or progressive disease.  Narrowing within the proximal ascending colon with upstream mild cecal and terminal ileum dilatation, similar. Cycle 6 FOLFIRI/Avastin 06/17/2020 Cycle 7 FOLFIRI/Avastin 07/01/2020 Cycle 8 FOLFIRI/Avastin 07/15/2020 Cycle 9 FOLFIRI/Avastin 07/29/2020-5-FU bolus eliminated, 5-FU infusion and irinotecan dose reduced Cycle 10 FOLFIRI/Avastin 08/12/2020 CTs 08/23/2020- mild residual wall thickening involving the cecum/ascending colon.  Numerous liver metastases improved. Cycle 11 FOLFIRI/Avastin 08/25/2020 Cycle 12 FOLFIRI/Avastin 09/16/2020 Cycle 13 FOLFIRI/Avastin 10/07/2020 Cycle 14 FOLFIRI/Avastin 10/28/2020 Cycle 15 FOLFIRI/Avastin 11/18/2020 CT abdomen/pelvis 12/06/2020-mild improvement in multifocal hepatic metastases, wall thickening at the ascending colon with pericolonic inflammatory changes Cycle 16 FOLFIRI/Avastin 12/09/2020 Cycle 17 FOLFIRI/Avastin 12/30/2020 Cycle 18 FOLFIRI/Avastin 01/20/2021 Cycle 19 FOLFIRI/Avastin 02/10/2021 Cycle 20 FOLFIRI/Avastin 03/03/2021 Cycle 21 FOLFIRI/Avastin 03/24/2021 Cycle 22 FOLFIRI/Avastin 04/14/2021 CTs 05/04/2021-slight improvement in hepatic metastatic disease. 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  She has completed 23 cycles of FOLFIRI/Avastin.  CT scans from last month showed further improvement in liver metastases.  Plan to proceed with cycle 24 FOLFIRI/Avastin today as scheduled.  CBC   from today reviewed.  Counts adequate to proceed with treatment.  Urine negative for protein.  She will return for lab, follow-up, FOLFIRI/Avastin in 3 weeks.  She will contact the office in the interim with any problems.    Lisa Thomas ANP/GNP-BC   05/25/2021  8:49 AM         

## 2021-05-27 ENCOUNTER — Other Ambulatory Visit: Payer: Self-pay

## 2021-05-27 ENCOUNTER — Inpatient Hospital Stay: Payer: BC Managed Care – PPO

## 2021-05-27 VITALS — BP 132/51 | HR 60 | Temp 98.0°F | Resp 18

## 2021-05-27 DIAGNOSIS — K7460 Unspecified cirrhosis of liver: Secondary | ICD-10-CM | POA: Diagnosis not present

## 2021-05-27 DIAGNOSIS — Z5112 Encounter for antineoplastic immunotherapy: Secondary | ICD-10-CM | POA: Diagnosis not present

## 2021-05-27 DIAGNOSIS — Z7901 Long term (current) use of anticoagulants: Secondary | ICD-10-CM | POA: Diagnosis not present

## 2021-05-27 DIAGNOSIS — Z5111 Encounter for antineoplastic chemotherapy: Secondary | ICD-10-CM | POA: Diagnosis not present

## 2021-05-27 DIAGNOSIS — Z23 Encounter for immunization: Secondary | ICD-10-CM | POA: Diagnosis not present

## 2021-05-27 DIAGNOSIS — C182 Malignant neoplasm of ascending colon: Secondary | ICD-10-CM

## 2021-05-27 DIAGNOSIS — C787 Secondary malignant neoplasm of liver and intrahepatic bile duct: Secondary | ICD-10-CM | POA: Diagnosis not present

## 2021-05-27 DIAGNOSIS — Z5189 Encounter for other specified aftercare: Secondary | ICD-10-CM | POA: Diagnosis not present

## 2021-05-27 DIAGNOSIS — Z452 Encounter for adjustment and management of vascular access device: Secondary | ICD-10-CM | POA: Diagnosis not present

## 2021-05-27 DIAGNOSIS — D500 Iron deficiency anemia secondary to blood loss (chronic): Secondary | ICD-10-CM | POA: Diagnosis not present

## 2021-05-27 DIAGNOSIS — Z86718 Personal history of other venous thrombosis and embolism: Secondary | ICD-10-CM | POA: Diagnosis not present

## 2021-05-27 MED ORDER — SODIUM CHLORIDE 0.9% FLUSH
10.0000 mL | INTRAVENOUS | Status: DC | PRN
  Administered 2021-05-27: 10 mL

## 2021-05-27 MED ORDER — HEPARIN SOD (PORK) LOCK FLUSH 100 UNIT/ML IV SOLN
500.0000 [IU] | Freq: Once | INTRAVENOUS | Status: AC | PRN
  Administered 2021-05-27: 500 [IU]

## 2021-05-27 MED ORDER — PEGFILGRASTIM-CBQV 6 MG/0.6ML ~~LOC~~ SOSY
6.0000 mg | PREFILLED_SYRINGE | Freq: Once | SUBCUTANEOUS | Status: AC
  Administered 2021-05-27: 6 mg via SUBCUTANEOUS

## 2021-05-27 NOTE — Patient Instructions (Signed)

## 2021-06-01 ENCOUNTER — Encounter: Payer: Self-pay | Admitting: *Deleted

## 2021-06-01 NOTE — Progress Notes (Signed)
Faxed last office note, chemo flowsheet and med list to [employer] case manager, Sallye Lat as requested.

## 2021-06-02 ENCOUNTER — Other Ambulatory Visit: Payer: Self-pay | Admitting: Oncology

## 2021-06-02 DIAGNOSIS — C189 Malignant neoplasm of colon, unspecified: Secondary | ICD-10-CM | POA: Diagnosis not present

## 2021-06-02 DIAGNOSIS — C182 Malignant neoplasm of ascending colon: Secondary | ICD-10-CM | POA: Diagnosis not present

## 2021-06-03 ENCOUNTER — Encounter: Payer: Self-pay | Admitting: Oncology

## 2021-06-11 ENCOUNTER — Other Ambulatory Visit: Payer: Self-pay | Admitting: Oncology

## 2021-06-15 ENCOUNTER — Inpatient Hospital Stay: Payer: BC Managed Care – PPO

## 2021-06-15 ENCOUNTER — Inpatient Hospital Stay: Payer: BC Managed Care – PPO | Attending: Oncology

## 2021-06-15 ENCOUNTER — Inpatient Hospital Stay (HOSPITAL_BASED_OUTPATIENT_CLINIC_OR_DEPARTMENT_OTHER): Payer: BC Managed Care – PPO | Admitting: Oncology

## 2021-06-15 ENCOUNTER — Other Ambulatory Visit: Payer: Self-pay

## 2021-06-15 DIAGNOSIS — Z5112 Encounter for antineoplastic immunotherapy: Secondary | ICD-10-CM | POA: Diagnosis not present

## 2021-06-15 DIAGNOSIS — Z452 Encounter for adjustment and management of vascular access device: Secondary | ICD-10-CM | POA: Diagnosis not present

## 2021-06-15 DIAGNOSIS — D649 Anemia, unspecified: Secondary | ICD-10-CM

## 2021-06-15 DIAGNOSIS — Z5189 Encounter for other specified aftercare: Secondary | ICD-10-CM | POA: Insufficient documentation

## 2021-06-15 DIAGNOSIS — C182 Malignant neoplasm of ascending colon: Secondary | ICD-10-CM | POA: Diagnosis not present

## 2021-06-15 DIAGNOSIS — C787 Secondary malignant neoplasm of liver and intrahepatic bile duct: Secondary | ICD-10-CM | POA: Diagnosis not present

## 2021-06-15 DIAGNOSIS — C189 Malignant neoplasm of colon, unspecified: Secondary | ICD-10-CM | POA: Diagnosis not present

## 2021-06-15 DIAGNOSIS — D500 Iron deficiency anemia secondary to blood loss (chronic): Secondary | ICD-10-CM | POA: Insufficient documentation

## 2021-06-15 DIAGNOSIS — Z7901 Long term (current) use of anticoagulants: Secondary | ICD-10-CM | POA: Diagnosis not present

## 2021-06-15 DIAGNOSIS — Z5111 Encounter for antineoplastic chemotherapy: Secondary | ICD-10-CM | POA: Diagnosis not present

## 2021-06-15 DIAGNOSIS — Z86718 Personal history of other venous thrombosis and embolism: Secondary | ICD-10-CM | POA: Insufficient documentation

## 2021-06-15 DIAGNOSIS — K7460 Unspecified cirrhosis of liver: Secondary | ICD-10-CM | POA: Diagnosis not present

## 2021-06-15 LAB — CMP (CANCER CENTER ONLY)
ALT: 37 U/L (ref 0–44)
AST: 41 U/L (ref 15–41)
Albumin: 3.3 g/dL — ABNORMAL LOW (ref 3.5–5.0)
Alkaline Phosphatase: 169 U/L — ABNORMAL HIGH (ref 38–126)
Anion gap: 7 (ref 5–15)
BUN: 20 mg/dL (ref 8–23)
CO2: 25 mmol/L (ref 22–32)
Calcium: 8.7 mg/dL — ABNORMAL LOW (ref 8.9–10.3)
Chloride: 109 mmol/L (ref 98–111)
Creatinine: 0.57 mg/dL (ref 0.44–1.00)
GFR, Estimated: >60 mL/min (ref >60)
Glucose, Bld: 57 mg/dL — ABNORMAL LOW (ref 70–99)
Potassium: 4.2 mmol/L (ref 3.5–5.1)
Sodium: 141 mmol/L (ref 135–145)
Total Bilirubin: 0.4 mg/dL (ref 0.3–1.2)
Total Protein: 5.9 g/dL — ABNORMAL LOW (ref 6.5–8.1)

## 2021-06-15 LAB — CBC WITH DIFFERENTIAL (CANCER CENTER ONLY)
Abs Immature Granulocytes: 0.02 10*3/uL (ref 0.00–0.07)
Basophils Absolute: 0.1 10*3/uL (ref 0.0–0.1)
Basophils Relative: 1 %
Eosinophils Absolute: 0.1 10*3/uL (ref 0.0–0.5)
Eosinophils Relative: 3 %
HCT: 30.4 % — ABNORMAL LOW (ref 36.0–46.0)
Hemoglobin: 9.6 g/dL — ABNORMAL LOW (ref 12.0–15.0)
Immature Granulocytes: 0 %
Lymphocytes Relative: 17 %
Lymphs Abs: 0.9 10*3/uL (ref 0.7–4.0)
MCH: 31.5 pg (ref 26.0–34.0)
MCHC: 31.6 g/dL (ref 30.0–36.0)
MCV: 99.7 fL (ref 80.0–100.0)
Monocytes Absolute: 0.7 10*3/uL (ref 0.1–1.0)
Monocytes Relative: 13 %
Neutro Abs: 3.4 10*3/uL (ref 1.7–7.7)
Neutrophils Relative %: 66 %
Platelet Count: 110 10*3/uL — ABNORMAL LOW (ref 150–400)
RBC: 3.05 MIL/uL — ABNORMAL LOW (ref 3.87–5.11)
RDW: 16.2 % — ABNORMAL HIGH (ref 11.5–15.5)
WBC Count: 5.2 10*3/uL (ref 4.0–10.5)
nRBC: 0 % (ref 0.0–0.2)

## 2021-06-15 LAB — CEA (ACCESS): CEA (CHCC): 26.59 ng/mL — ABNORMAL HIGH (ref 0.00–5.00)

## 2021-06-15 MED ORDER — SODIUM CHLORIDE 0.9 % IV SOLN
300.0000 mg | Freq: Once | INTRAVENOUS | Status: AC
  Administered 2021-06-15: 300 mg via INTRAVENOUS
  Filled 2021-06-15: qty 12

## 2021-06-15 MED ORDER — ATROPINE SULFATE 1 MG/ML IV SOLN
0.5000 mg | Freq: Once | INTRAVENOUS | Status: AC | PRN
  Administered 2021-06-15: 0.5 mg via INTRAVENOUS
  Filled 2021-06-15: qty 1

## 2021-06-15 MED ORDER — SODIUM CHLORIDE 0.9 % IV SOLN
140.0000 mg/m2 | Freq: Once | INTRAVENOUS | Status: AC
  Administered 2021-06-15: 220 mg via INTRAVENOUS
  Filled 2021-06-15: qty 11

## 2021-06-15 MED ORDER — SODIUM CHLORIDE 0.9 % IV SOLN
Freq: Once | INTRAVENOUS | Status: AC
Start: 2021-06-15 — End: 2021-06-15

## 2021-06-15 MED ORDER — SODIUM CHLORIDE 0.9 % IV SOLN
2500.0000 mg | INTRAVENOUS | Status: DC
  Administered 2021-06-15: 2500 mg via INTRAVENOUS
  Filled 2021-06-15: qty 50

## 2021-06-15 MED ORDER — SODIUM CHLORIDE 0.9 % IV SOLN
200.0000 mg/m2 | Freq: Once | INTRAVENOUS | Status: AC
  Administered 2021-06-15: 318 mg via INTRAVENOUS
  Filled 2021-06-15: qty 15.9

## 2021-06-15 MED ORDER — SODIUM CHLORIDE 0.9 % IV SOLN
10.0000 mg | Freq: Once | INTRAVENOUS | Status: AC
  Administered 2021-06-15: 10 mg via INTRAVENOUS
  Filled 2021-06-15: qty 1

## 2021-06-15 MED ORDER — PALONOSETRON HCL INJECTION 0.25 MG/5ML
0.2500 mg | Freq: Once | INTRAVENOUS | Status: AC
  Administered 2021-06-15: 0.25 mg via INTRAVENOUS
  Filled 2021-06-15: qty 5

## 2021-06-15 MED ORDER — OXYCODONE-ACETAMINOPHEN 5-325 MG PO TABS: 1.0000 | ORAL_TABLET | Freq: Three times a day (TID) | ORAL | 0 refills | Status: DC | PRN

## 2021-06-15 NOTE — Progress Notes (Signed)
[place] OFFICE PROGRESS NOTE   Diagnosis: Colon cancer  INTERVAL HISTORY:   Ms. Faulstich returns as scheduled.  She completed another cycle of FOLFIRI/bevacizumab on 05/25/2021.  No nausea/vomiting, mouth sores, or bleeding.  She continues Lovenox.  Mild diarrhea following chemotherapy.  She is working.  Objective:  Vital signs in last 24 hours:  Blood pressure 120/60, pulse 60, temperature 97.8 F (36.6 C), temperature source Oral, resp. rate 20, height $RemoveBe'5\' 7"'ckznISSVH$  (1.702 m), weight 127 lb 3.2 oz (57.7 kg), SpO2 98 %.    HEENT: No thrush or ulcers Resp: Lungs clear bilaterally Cardio: Regular rate and rhythm GI: No hepatosplenomegaly, nontender, soft fullness in the right mid lateral abdomen Vascular: No leg edema   Portacath/PICC-without erythema  Lab Results:  Lab Results  Component Value Date   WBC 5.2 06/15/2021   HGB 9.6 (L) 06/15/2021   HCT 30.4 (L) 06/15/2021   MCV 99.7 06/15/2021   PLT 110 (L) 06/15/2021   NEUTROABS 3.4 06/15/2021    CMP  Lab Results  Component Value Date   NA 141 05/25/2021   K 4.0 05/25/2021   CL 110 05/25/2021   CO2 25 05/25/2021   GLUCOSE 80 05/25/2021   BUN 21 05/25/2021   CREATININE 0.61 05/25/2021   CALCIUM 8.9 05/25/2021   PROT 5.9 (L) 05/25/2021   ALBUMIN 3.5 05/25/2021   AST 30 05/25/2021   ALT 24 05/25/2021   ALKPHOS 184 (H) 05/25/2021   BILITOT 0.3 05/25/2021   GFRNONAA >60 05/25/2021   GFRAA >60 05/06/2020    Lab Results  Component Value Date   CEA1 54.57 (H) 12/30/2020   CEA 22.82 (H) 05/25/2021    Medications: I have reviewed the patient's current medications.   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  Cirrhosis.  Innumerable liver lesions. Colonoscopy 12/18/2019-ulcerated partially obstructing large mass in the mid ascending colon.  Biopsy invasive adenocarcinoma; preserved  expression major MMR proteins Upper endoscopy 12/18/2019-normal esophagus, stomach, examined duodenum CEA 12/19/2019-8,923 Biopsy liver lesion 12/23/2019-adenocarcinoma Foundation 1-K-ras wild-type; tumor mutation burden greater than or equal to 10; microsatellite stable; BRAF V600E Cycle 1 FOLFOX 01/01/2020 Cycle 2 FOLFOX 01/15/2020  Cycle 3 FOLFOX 01/29/2020, Udenyca added Cycle 4 FOLFOX 02/12/2020, Udenyca held Cycle 5 FOLFOX 02/26/2020, Udenyca Cycle 6 FOLFOX 03/11/2020, Udenyca held CT abdomen/pelvis 03/16/2020-stable diffuse liver lesions, stable strictured appearing ascending colon, possible pericolonic implant, vague left lower lobe nodule Cycle 1 FOLFIRI/Avastin 04/06/2020 Cycle 2 FOLFIRI/Avastin 04/21/2020 Cycle 3 FOLFIRI/Avastin 05/06/2020 Cycle 4 FOLFIRI/Avastin 05/20/2020 Cycle 5 FOLFIRI/Avastin 06/03/2020 CTs 06/15/2020-mild to moderate improvement in hepatic metastasis.  No new or progressive disease.  Narrowing within the proximal ascending colon with upstream mild cecal and terminal ileum dilatation, similar. Cycle 6 FOLFIRI/Avastin 06/17/2020 Cycle 7 FOLFIRI/Avastin 07/01/2020 Cycle 8 FOLFIRI/Avastin 07/15/2020 Cycle 9 FOLFIRI/Avastin 07/29/2020-5-FU bolus eliminated, 5-FU infusion and irinotecan dose reduced Cycle 10 FOLFIRI/Avastin 08/12/2020 CTs 08/23/2020- mild residual wall thickening involving the cecum/ascending colon.  Numerous liver metastases improved. Cycle 11 FOLFIRI/Avastin 08/25/2020 Cycle 12 FOLFIRI/Avastin 09/16/2020 Cycle 13 FOLFIRI/Avastin 10/07/2020 Cycle 14 FOLFIRI/Avastin 10/28/2020 Cycle 15 FOLFIRI/Avastin 11/18/2020 CT abdomen/pelvis 12/06/2020-mild improvement in multifocal hepatic metastases, wall thickening at the ascending colon with pericolonic inflammatory changes Cycle 16 FOLFIRI/Avastin 12/09/2020 Cycle 17 FOLFIRI/Avastin 12/30/2020 Cycle 18 FOLFIRI/Avastin 01/20/2021 Cycle 19 FOLFIRI/Avastin 02/10/2021 Cycle 20 FOLFIRI/Avastin 03/03/2021 Cycle 21 FOLFIRI/Avastin  03/24/2021 Cycle 22 FOLFIRI/Avastin 04/14/2021 CTs 05/04/2021-slight improvement in hepatic metastatic disease. 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  She will complete another cycle of FOLFIRI/bevacizumab today.  She plans to obtain a COVID-19 vaccine on 06/17/2021.  Ms. Takacs will change to an every 4-week interval for the next 2 cycles of chemotherapy due to the upcoming holidays.  She will return for an office visit in the next cycle of chemotherapy on 07/13/2021.  [name], MD  06/15/2021  8:54 AM

## 2021-06-15 NOTE — Patient Instructions (Signed)
[facility]   Discharge Instructions: Thank you for choosing [facility] to provide your oncology and hematology care.   If you have a lab appointment with the [facility], please go directly to the [facility] and check in at the registration area.   Wear comfortable clothing and clothing appropriate for easy access to any Portacath or PICC line.   We strive to give you quality time with your provider. You may need to reschedule your appointment if you arrive late (15 or more minutes).  Arriving late affects you and other patients whose appointments are after yours.  Also, if you miss three or more appointments without notifying the office, you may be dismissed from the clinic at the provider's discretion.      For prescription refill requests, have your pharmacy contact our office and allow 72 hours for refills to be completed.    Today you received the following chemotherapy and/or immunotherapy agents Bevacizumab-bvzr (ZIRABEV), Irinotecan (Camptosar), Leucovorin & Flourouracil (ADRUCIL).      To help prevent nausea and vomiting after your treatment, we encourage you to take your nausea medication as directed.  BELOW ARE SYMPTOMS THAT SHOULD BE REPORTED IMMEDIATELY: *FEVER GREATER THAN 100.4 F (38 C) OR HIGHER *CHILLS OR SWEATING *NAUSEA AND VOMITING THAT IS NOT CONTROLLED WITH YOUR NAUSEA MEDICATION *UNUSUAL SHORTNESS OF BREATH *UNUSUAL BRUISING OR BLEEDING *URINARY PROBLEMS (pain or burning when urinating, or frequent urination) *BOWEL PROBLEMS (unusual diarrhea, constipation, pain near the anus) TENDERNESS IN MOUTH AND THROAT WITH OR WITHOUT PRESENCE OF ULCERS (sore throat, sores in mouth, or a toothache) UNUSUAL RASH, SWELLING OR PAIN  UNUSUAL VAGINAL DISCHARGE OR ITCHING   Items with * indicate a potential emergency and should be followed up as soon as possible or go to the Emergency Department if any problems should occur.  Please  show the CHEMOTHERAPY ALERT CARD or IMMUNOTHERAPY ALERT CARD at check-in to the Emergency Department and triage nurse.  Should you have questions after your visit or need to cancel or reschedule your appointment, please contact [facility]  Dept: [phone]  and follow the prompts.  Office hours are 8:00 a.m. to 4:30 p.m. Monday - Friday. Please note that voicemails left after 4:00 p.m. may not be returned until the following business day.  We are closed weekends and major holidays. You have access to a nurse at all times for urgent questions. Please call the main number to the clinic Dept: [phone] and follow the prompts.   For any non-urgent questions, you may also contact your provider using MyChart. We now offer e-Visits for anyone 41 and older to request care online for non-urgent symptoms. For details visit [email or web address].   Also download the MyChart app! Go to the app store, search "MyChart", open the app, select [facility], and log in with your MyChart username and password.  Due to Covid, a mask is required upon entering the hospital/clinic. If you do not have a mask, one will be given to you upon arrival. For doctor visits, patients may have 1 support person aged 59 or older with them. For treatment visits, patients cannot have anyone with them due to current Covid guidelines and our immunocompromised population.   Bevacizumab injection What is this medication? BEVACIZUMAB (be va SIZ yoo mab) is a monoclonal antibody. It is used to treat many types of cancer. This medicine may be used for other purposes; ask your health care provider or pharmacist if  you have questions. COMMON BRAND NAME(S): Avastin, MVASI, [name] What should I tell my care team before I take this medication? They need to know if you have any of these conditions: diabetes heart disease high blood pressure history of coughing up blood prior anthracycline chemotherapy (e.g.,  doxorubicin, daunorubicin, epirubicin) recent or ongoing radiation therapy recent or planning to have surgery stroke an unusual or allergic reaction to bevacizumab, hamster proteins, mouse proteins, other medicines, foods, dyes, or preservatives pregnant or trying to get pregnant breast-feeding How should I use this medication? This medicine is for infusion into a vein. It is given by a health care professional in a hospital or clinic setting. Talk to your pediatrician regarding the use of this medicine in children. Special care may be needed. Overdosage: If you think you have taken too much of this medicine contact a poison control center or emergency room at once. NOTE: This medicine is only for you. Do not share this medicine with others. What if I miss a dose? It is important not to miss your dose. Call your doctor or health care professional if you are unable to keep an appointment. What may interact with this medication? Interactions are not expected. This list may not describe all possible interactions. Give your health care provider a list of all the medicines, herbs, non-prescription drugs, or dietary supplements you use. Also tell them if you smoke, drink alcohol, or use illegal drugs. Some items may interact with your medicine. What should I watch for while using this medication? Your condition will be monitored carefully while you are receiving this medicine. You will need important blood work and urine testing done while you are taking this medicine. This medicine may increase your risk to bruise or bleed. Call your doctor or health care professional if you notice any unusual bleeding. Before having surgery, talk to your health care provider to make sure it is ok. This drug can increase the risk of poor healing of your surgical site or wound. You will need to stop this drug for 28 days before surgery. After surgery, wait at least 28 days before restarting this drug. Make sure the  surgical site or wound is healed enough before restarting this drug. Talk to your health care provider if questions. Do not become pregnant while taking this medicine or for 6 months after stopping it. Women should inform their doctor if they wish to become pregnant or think they might be pregnant. There is a potential for serious side effects to an unborn child. Talk to your health care professional or pharmacist for more information. Do not breast-feed an infant while taking this medicine and for 6 months after the last dose. This medicine has caused ovarian failure in some women. This medicine may interfere with the ability to have a child. You should talk to your doctor or health care professional if you are concerned about your fertility. What side effects may I notice from receiving this medication? Side effects that you should report to your doctor or health care professional as soon as possible: allergic reactions like skin rash, itching or hives, swelling of the face, lips, or tongue chest pain or chest tightness chills coughing up blood high fever seizures severe constipation signs and symptoms of bleeding such as bloody or black, tarry stools; red or dark-Safr[place]k urine; spitting up blood or Reiger material that looks like coffee grounds; red spots on the skin; unusual bruising or bleeding from the eye, gums, or nose signs and symptoms of  a blood clot such as breathing problems; chest pain; severe, sudden headache; pain, swelling, warmth in the leg signs and symptoms of a stroke like changes in vision; confusion; trouble speaking or understanding; severe headaches; sudden numbness or weakness of the face, arm or leg; trouble walking; dizziness; loss of balance or coordination stomach pain sweating swelling of legs or ankles vomiting weight gain Side effects that usually do not require medical attention (report to your doctor or health care professional if they continue or are  bothersome): back pain changes in taste decreased appetite dry skin nausea tiredness This list may not describe all possible side effects. Call your doctor for medical advice about side effects. You may report side effects to FDA at 1-800-FDA-1088. Where should I keep my medication? This drug is given in a hospital or clinic and will not be stored at home. NOTE: This sheet is a summary. It may not cover all possible information. If you have questions about this medicine, talk to your doctor, pharmacist, or health care provider.  2022 Elsevier/Gold Standard (2019-05-28 10:50:46)  Irinotecan injection What is this medication? IRINOTECAN (ir in oh TEE kan ) is a chemotherapy drug. It is used to treat colon and rectal cancer. This medicine may be used for other purposes; ask your health care provider or pharmacist if you have questions. COMMON BRAND NAME(S): Camptosar What should I tell my care team before I take this medication? They need to know if you have any of these conditions: dehydration diarrhea infection (especially a virus infection such as chickenpox, cold sores, or herpes) liver disease low blood counts, like low white cell, platelet, or red cell counts low levels of calcium, magnesium, or potassium in the blood recent or ongoing radiation therapy an unusual or allergic reaction to irinotecan, other medicines, foods, dyes, or preservatives pregnant or trying to get pregnant breast-feeding How should I use this medication? This drug is given as an infusion into a vein. It is administered in a hospital or clinic by a specially trained health care professional. Talk to your pediatrician regarding the use of this medicine in children. Special care may be needed. Overdosage: If you think you have taken too much of this medicine contact a poison control center or emergency room at once. NOTE: This medicine is only for you. Do not share this medicine with others. What if I miss  a dose? It is important not to miss your dose. Call your doctor or health care professional if you are unable to keep an appointment. What may interact with this medication? Do not take this medicine with any of the following medications: cobicistat itraconazole This medicine may interact with the following medications: antiviral medicines for HIV or AIDS certain antibiotics like rifampin or rifabutin certain medicines for fungal infections like ketoconazole, posaconazole, and voriconazole certain medicines for seizures like carbamazepine, phenobarbital, phenotoin clarithromycin gemfibrozil nefazodone St. John's Wort This list may not describe all possible interactions. Give your health care provider a list of all the medicines, herbs, non-prescription drugs, or dietary supplements you use. Also tell them if you smoke, drink alcohol, or use illegal drugs. Some items may interact with your medicine. What should I watch for while using this medication? Your condition will be monitored carefully while you are receiving this medicine. You will need important blood work done while you are taking this medicine. This drug may make you feel generally unwell. This is not uncommon, as chemotherapy can affect healthy cells as well as cancer cells. Report  any side effects. Continue your course of treatment even though you feel ill unless your doctor tells you to stop. In some cases, you may be given additional medicines to help with side effects. Follow all directions for their use. You may get drowsy or dizzy. Do not drive, use machinery, or do anything that needs mental alertness until you know how this medicine affects you. Do not stand or sit up quickly, especially if you are an older patient. This reduces the risk of dizzy or fainting spells. Call your health care professional for advice if you get a fever, chills, or sore throat, or other symptoms of a cold or flu. Do not treat yourself. This medicine  decreases your body's ability to fight infections. Try to avoid being around people who are sick. Avoid taking products that contain aspirin, acetaminophen, ibuprofen, naproxen, or ketoprofen unless instructed by your doctor. These medicines may hide a fever. This medicine may increase your risk to bruise or bleed. Call your doctor or health care professional if you notice any unusual bleeding. Be careful brushing and flossing your teeth or using a toothpick because you may get an infection or bleed more easily. If you have any dental work done, tell your dentist you are receiving this medicine. Do not become pregnant while taking this medicine or for 6 months after stopping it. Women should inform their health care professional if they wish to become pregnant or think they might be pregnant. Men should not father a child while taking this medicine and for 3 months after stopping it. There is potential for serious side effects to an unborn child. Talk to your health care professional for more information. Do not breast-feed an infant while taking this medicine or for 7 days after stopping it. This medicine has caused ovarian failure in some women. This medicine may make it more difficult to get pregnant. Talk to your health care professional if you are concerned about your fertility. This medicine has caused decreased sperm counts in some men. This may make it more difficult to father a child. Talk to your health care professional if you are concerned about your fertility. What side effects may I notice from receiving this medication? Side effects that you should report to your doctor or health care professional as soon as possible: allergic reactions like skin rash, itching or hives, swelling of the face, lips, or tongue chest pain diarrhea flushing, runny nose, sweating during infusion low blood counts - this medicine may decrease the number of white blood cells, red blood cells and platelets. You  may be at increased risk for infections and bleeding. nausea, vomiting pain, swelling, warmth in the leg signs of decreased platelets or bleeding - bruising, pinpoint red spots on the skin, black, tarry stools, blood in the urine signs of infection - fever or chills, cough, sore throat, pain or difficulty passing urine signs of decreased red blood cells - unusually weak or tired, fainting spells, lightheadedness Side effects that usually do not require medical attention (report to your doctor or health care professional if they continue or are bothersome): constipation hair loss headache loss of appetite mouth sores stomach pain This list may not describe all possible side effects. Call your doctor for medical advice about side effects. You may report side effects to FDA at 1-800-FDA-1088. Where should I keep my medication? This drug is given in a hospital or clinic and will not be stored at home. NOTE: This sheet is a summary. It may not  cover all possible information. If you have questions about this medicine, talk to your doctor, pharmacist, or health care provider.  2022 Elsevier/Gold Standard (2019-07-01 17:46:13)  Leucovorin injection What is this medication? LEUCOVORIN (loo koe VOR in) is used to prevent or treat the harmful effects of some medicines. This medicine is used to treat anemia caused by a low amount of folic acid in the body. It is also used with 5-fluorouracil (5-FU) to treat colon cancer. This medicine may be used for other purposes; ask your health care provider or pharmacist if you have questions. What should I tell my care team before I take this medication? They need to know if you have any of these conditions: anemia from low levels of vitamin B-12 in the blood an unusual or allergic reaction to leucovorin, folic acid, other medicines, foods, dyes, or preservatives pregnant or trying to get pregnant breast-feeding How should I use this medication? This  medicine is for injection into a muscle or into a vein. It is given by a health care professional in a hospital or clinic setting. Talk to your pediatrician regarding the use of this medicine in children. Special care may be needed. Overdosage: If you think you have taken too much of this medicine contact a poison control center or emergency room at once. NOTE: This medicine is only for you. Do not share this medicine with others. What if I miss a dose? This does not apply. What may interact with this medication? capecitabine fluorouracil phenobarbital phenytoin primidone trimethoprim-sulfamethoxazole This list may not describe all possible interactions. Give your health care provider a list of all the medicines, herbs, non-prescription drugs, or dietary supplements you use. Also tell them if you smoke, drink alcohol, or use illegal drugs. Some items may interact with your medicine. What should I watch for while using this medication? Your condition will be monitored carefully while you are receiving this medicine. This medicine may increase the side effects of 5-fluorouracil, 5-FU. Tell your doctor or health care professional if you have diarrhea or mouth sores that do not get better or that get worse. What side effects may I notice from receiving this medication? Side effects that you should report to your doctor or health care professional as soon as possible: allergic reactions like skin rash, itching or hives, swelling of the face, lips, or tongue breathing problems fever, infection mouth sores unusual bleeding or bruising unusually weak or tired Side effects that usually do not require medical attention (report to your doctor or health care professional if they continue or are bothersome): constipation or diarrhea loss of appetite nausea, vomiting This list may not describe all possible side effects. Call your doctor for medical advice about side effects. You may report side  effects to FDA at 1-800-FDA-1088. Where should I keep my medication? This drug is given in a hospital or clinic and will not be stored at home. NOTE: This sheet is a summary. It may not cover all possible information. If you have questions about this medicine, talk to your doctor, pharmacist, or health care provider.  2022 Elsevier/Gold Standard (2008-02-04 16:50:29)  Fluorouracil, 5-FU injection What is this medication? FLUOROURACIL, 5-FU (flure oh YOOR a sil) is a chemotherapy drug. It slows the growth of cancer cells. This medicine is used to treat many types of cancer like breast cancer, colon or rectal cancer, pancreatic cancer, and stomach cancer. This medicine may be used for other purposes; ask your health care provider or pharmacist if you have questions.  COMMON BRAND NAME(S): Adrucil What should I tell my care team before I take this medication? They need to know if you have any of these conditions: blood disorders dihydropyrimidine dehydrogenase (DPD) deficiency infection (especially a virus infection such as chickenpox, cold sores, or herpes) kidney disease liver disease malnourished, poor nutrition recent or ongoing radiation therapy an unusual or allergic reaction to fluorouracil, other chemotherapy, other medicines, foods, dyes, or preservatives pregnant or trying to get pregnant breast-feeding How should I use this medication? This drug is given as an infusion or injection into a vein. It is administered in a hospital or clinic by a specially trained health care professional. Talk to your pediatrician regarding the use of this medicine in children. Special care may be needed. Overdosage: If you think you have taken too much of this medicine contact a poison control center or emergency room at once. NOTE: This medicine is only for you. Do not share this medicine with others. What if I miss a dose? It is important not to miss your dose. Call your doctor or health care  professional if you are unable to keep an appointment. What may interact with this medication? Do not take this medicine with any of the following medications: live virus vaccines This medicine may also interact with the following medications: medicines that treat or prevent blood clots like warfarin, enoxaparin, and dalteparin This list may not describe all possible interactions. Give your health care provider a list of all the medicines, herbs, non-prescription drugs, or dietary supplements you use. Also tell them if you smoke, drink alcohol, or use illegal drugs. Some items may interact with your medicine. What should I watch for while using this medication? Visit your doctor for checks on your progress. This drug may make you feel generally unwell. This is not uncommon, as chemotherapy can affect healthy cells as well as cancer cells. Report any side effects. Continue your course of treatment even though you feel ill unless your doctor tells you to stop. In some cases, you may be given additional medicines to help with side effects. Follow all directions for their use. Call your doctor or health care professional for advice if you get a fever, chills or sore throat, or other symptoms of a cold or flu. Do not treat yourself. This drug decreases your body's ability to fight infections. Try to avoid being around people who are sick. This medicine may increase your risk to bruise or bleed. Call your doctor or health care professional if you notice any unusual bleeding. Be careful brushing and flossing your teeth or using a toothpick because you may get an infection or bleed more easily. If you have any dental work done, tell your dentist you are receiving this medicine. Avoid taking products that contain aspirin, acetaminophen, ibuprofen, naproxen, or ketoprofen unless instructed by your doctor. These medicines may hide a fever. Do not become pregnant while taking this medicine. Women should inform  their doctor if they wish to become pregnant or think they might be pregnant. There is a potential for serious side effects to an unborn child. Talk to your health care professional or pharmacist for more information. Do not breast-feed an infant while taking this medicine. Men should inform their doctor if they wish to father a child. This medicine may lower sperm counts. Do not treat diarrhea with over the counter products. Contact your doctor if you have diarrhea that lasts more than 2 days or if it is severe and watery. This medicine can  make you more sensitive to the sun. Keep out of the sun. If you cannot avoid being in the sun, wear protective clothing and use sunscreen. Do not use sun lamps or tanning beds/booths. What side effects may I notice from receiving this medication? Side effects that you should report to your doctor or health care professional as soon as possible: allergic reactions like skin rash, itching or hives, swelling of the face, lips, or tongue low blood counts - this medicine may decrease the number of white blood cells, red blood cells and platelets. You may be at increased risk for infections and bleeding. signs of infection - fever or chills, cough, sore throat, pain or difficulty passing urine signs of decreased platelets or bleeding - bruising, pinpoint red spots on the skin, black, tarry stools, blood in the urine signs of decreased red blood cells - unusually weak or tired, fainting spells, lightheadedness breathing problems changes in vision chest pain mouth sores nausea and vomiting pain, swelling, redness at site where injected pain, tingling, numbness in the hands or feet redness, swelling, or sores on hands or feet stomach pain unusual bleeding Side effects that usually do not require medical attention (report to your doctor or health care professional if they continue or are bothersome): changes in finger or toe nails diarrhea dry or itchy skin hair  loss headache loss of appetite sensitivity of eyes to the light stomach upset unusually teary eyes This list may not describe all possible side effects. Call your doctor for medical advice about side effects. You may report side effects to FDA at 1-800-FDA-1088. Where should I keep my medication? This drug is given in a hospital or clinic and will not be stored at home. NOTE: This sheet is a summary. It may not cover all possible information. If you have questions about this medicine, talk to your doctor, pharmacist, or health care provider.  2022 Elsevier/Gold Standard (2019-07-01 15:00:03)  The chemotherapy medication bag should finish at 46 hours, 96 hours, or 7 days. For example, if your pump is scheduled for 46 hours and it was put on at 4:00 p.m., it should finish at 2:00 p.m. the day it is scheduled to come off regardless of your appointment time.     Estimated time to finish at 11:00 a.m. on Friday 06/17/2021.   If the display on your pump reads "Low Volume" and it is beeping, take the batteries out of the pump and come to the cancer center for it to be taken off.   If the pump alarms go off prior to the pump reading "Low Volume" then call [phone] and someone can assist you.  If the plunger comes out and the chemotherapy medication is leaking out, please use your home chemo spill kit to clean up the spill. Do NOT use paper towels or other household products.  If you have problems or questions regarding your pump, please call either [phone] (24 hours a day) or the cancer center Monday-Friday 8:00 a.m.- 4:30 p.m. at the clinic number and we will assist you. If you are unable to get assistance, then go to the nearest Emergency Department and ask the staff to contact the IV team for assistance.

## 2021-06-15 NOTE — Progress Notes (Signed)
Patient presents for treatment. RN assessment completed along with the following:  Labs/vitals reviewed - Yes, and within treatment parameters.   Weight within 10% of previous measurement - Yes Oncology Treatment Attestation completed for current therapy- Yes, on date 03/25/2021 Informed consent completed and reflects current therapy/intent - Yes, on date 04/21/2020             Provider progress note reviewed - Yes, today's provider note was reviewed. Treatment/Antibody/Supportive plan reviewed - Yes, and there are no adjustments needed for today's treatment. S&H and other orders reviewed - Yes, and there are no additional orders identified. Previous treatment date reviewed - Yes, and the appropriate amount of time has elapsed between treatments. Clinic Hand Off Received from - No.  Patient to proceed with treatment.

## 2021-06-17 ENCOUNTER — Inpatient Hospital Stay: Payer: BC Managed Care – PPO

## 2021-06-17 ENCOUNTER — Other Ambulatory Visit: Payer: Self-pay

## 2021-06-17 VITALS — BP 134/57 | HR 61 | Temp 98.2°F | Resp 18

## 2021-06-17 DIAGNOSIS — C182 Malignant neoplasm of ascending colon: Secondary | ICD-10-CM | POA: Diagnosis not present

## 2021-06-17 DIAGNOSIS — Z86718 Personal history of other venous thrombosis and embolism: Secondary | ICD-10-CM | POA: Diagnosis not present

## 2021-06-17 DIAGNOSIS — D500 Iron deficiency anemia secondary to blood loss (chronic): Secondary | ICD-10-CM | POA: Diagnosis not present

## 2021-06-17 DIAGNOSIS — Z5189 Encounter for other specified aftercare: Secondary | ICD-10-CM | POA: Diagnosis not present

## 2021-06-17 DIAGNOSIS — K7460 Unspecified cirrhosis of liver: Secondary | ICD-10-CM | POA: Diagnosis not present

## 2021-06-17 DIAGNOSIS — Z452 Encounter for adjustment and management of vascular access device: Secondary | ICD-10-CM | POA: Diagnosis not present

## 2021-06-17 DIAGNOSIS — C787 Secondary malignant neoplasm of liver and intrahepatic bile duct: Secondary | ICD-10-CM | POA: Diagnosis not present

## 2021-06-17 DIAGNOSIS — Z5111 Encounter for antineoplastic chemotherapy: Secondary | ICD-10-CM | POA: Diagnosis not present

## 2021-06-17 DIAGNOSIS — Z7901 Long term (current) use of anticoagulants: Secondary | ICD-10-CM | POA: Diagnosis not present

## 2021-06-17 DIAGNOSIS — Z5112 Encounter for antineoplastic immunotherapy: Secondary | ICD-10-CM | POA: Diagnosis not present

## 2021-06-17 MED ORDER — HEPARIN SOD (PORK) LOCK FLUSH 100 UNIT/ML IV SOLN
500.0000 [IU] | Freq: Once | INTRAVENOUS | Status: AC | PRN
  Administered 2021-06-17: 500 [IU]

## 2021-06-17 MED ORDER — SODIUM CHLORIDE 0.9% FLUSH
10.0000 mL | INTRAVENOUS | Status: DC | PRN
  Administered 2021-06-17: 10 mL

## 2021-06-17 MED ORDER — PEGFILGRASTIM-CBQV 6 MG/0.6ML ~~LOC~~ SOSY
6.0000 mg | PREFILLED_SYRINGE | Freq: Once | SUBCUTANEOUS | Status: AC
  Administered 2021-06-17: 6 mg via SUBCUTANEOUS

## 2021-06-17 NOTE — Patient Instructions (Signed)

## 2021-07-03 DIAGNOSIS — C182 Malignant neoplasm of ascending colon: Secondary | ICD-10-CM | POA: Diagnosis not present

## 2021-07-04 ENCOUNTER — Ambulatory Visit: Payer: BC Managed Care – PPO | Admitting: Nurse Practitioner

## 2021-07-04 ENCOUNTER — Ambulatory Visit: Payer: BC Managed Care – PPO

## 2021-07-04 ENCOUNTER — Other Ambulatory Visit: Payer: BC Managed Care – PPO

## 2021-07-10 ENCOUNTER — Other Ambulatory Visit: Payer: Self-pay | Admitting: Oncology

## 2021-07-13 ENCOUNTER — Inpatient Hospital Stay: Payer: BC Managed Care – PPO

## 2021-07-13 ENCOUNTER — Other Ambulatory Visit: Payer: Self-pay

## 2021-07-13 ENCOUNTER — Inpatient Hospital Stay (HOSPITAL_BASED_OUTPATIENT_CLINIC_OR_DEPARTMENT_OTHER): Payer: BC Managed Care – PPO | Admitting: Oncology

## 2021-07-13 ENCOUNTER — Telehealth: Payer: Self-pay

## 2021-07-13 VITALS — BP 128/66 | HR 60 | Temp 97.8°F | Resp 16

## 2021-07-13 VITALS — BP 132/60 | HR 62 | Temp 98.2°F | Resp 18 | Ht 67.0 in | Wt 125.8 lb

## 2021-07-13 DIAGNOSIS — C787 Secondary malignant neoplasm of liver and intrahepatic bile duct: Secondary | ICD-10-CM | POA: Diagnosis not present

## 2021-07-13 DIAGNOSIS — C182 Malignant neoplasm of ascending colon: Secondary | ICD-10-CM

## 2021-07-13 DIAGNOSIS — Z452 Encounter for adjustment and management of vascular access device: Secondary | ICD-10-CM | POA: Diagnosis not present

## 2021-07-13 DIAGNOSIS — Z5112 Encounter for antineoplastic immunotherapy: Secondary | ICD-10-CM | POA: Diagnosis not present

## 2021-07-13 DIAGNOSIS — Z5111 Encounter for antineoplastic chemotherapy: Secondary | ICD-10-CM | POA: Diagnosis not present

## 2021-07-13 DIAGNOSIS — Z86718 Personal history of other venous thrombosis and embolism: Secondary | ICD-10-CM | POA: Diagnosis not present

## 2021-07-13 DIAGNOSIS — D500 Iron deficiency anemia secondary to blood loss (chronic): Secondary | ICD-10-CM | POA: Diagnosis not present

## 2021-07-13 DIAGNOSIS — Z5189 Encounter for other specified aftercare: Secondary | ICD-10-CM | POA: Diagnosis not present

## 2021-07-13 DIAGNOSIS — K7460 Unspecified cirrhosis of liver: Secondary | ICD-10-CM | POA: Diagnosis not present

## 2021-07-13 DIAGNOSIS — Z7901 Long term (current) use of anticoagulants: Secondary | ICD-10-CM | POA: Diagnosis not present

## 2021-07-13 LAB — CMP (CANCER CENTER ONLY)
ALT: 25 U/L (ref 0–44)
AST: 34 U/L (ref 15–41)
Albumin: 3.5 g/dL (ref 3.5–5.0)
Alkaline Phosphatase: 132 U/L — ABNORMAL HIGH (ref 38–126)
Anion gap: 6 (ref 5–15)
BUN: 21 mg/dL (ref 8–23)
CO2: 26 mmol/L (ref 22–32)
Calcium: 9.2 mg/dL (ref 8.9–10.3)
Chloride: 112 mmol/L — ABNORMAL HIGH (ref 98–111)
Creatinine: 0.54 mg/dL (ref 0.44–1.00)
GFR, Estimated: >60 mL/min (ref >60)
Glucose, Bld: 86 mg/dL (ref 70–99)
Potassium: 4.1 mmol/L (ref 3.5–5.1)
Sodium: 144 mmol/L (ref 135–145)
Total Bilirubin: 0.3 mg/dL (ref 0.3–1.2)
Total Protein: 5.7 g/dL — ABNORMAL LOW (ref 6.5–8.1)

## 2021-07-13 LAB — CEA (ACCESS): CEA (CHCC): 34.21 ng/mL — ABNORMAL HIGH (ref 0.00–5.00)

## 2021-07-13 LAB — TOTAL PROTEIN, URINE DIPSTICK: Protein, ur: NEGATIVE mg/dL

## 2021-07-13 LAB — CBC WITH DIFFERENTIAL (CANCER CENTER ONLY)
Abs Immature Granulocytes: 0.01 10*3/uL (ref 0.00–0.07)
Basophils Absolute: 0 10*3/uL (ref 0.0–0.1)
Basophils Relative: 1 %
Eosinophils Absolute: 0.1 10*3/uL (ref 0.0–0.5)
Eosinophils Relative: 3 %
HCT: 30.8 % — ABNORMAL LOW (ref 36.0–46.0)
Hemoglobin: 9.7 g/dL — ABNORMAL LOW (ref 12.0–15.0)
Immature Granulocytes: 0 %
Lymphocytes Relative: 18 %
Lymphs Abs: 0.9 10*3/uL (ref 0.7–4.0)
MCH: 31.7 pg (ref 26.0–34.0)
MCHC: 31.5 g/dL (ref 30.0–36.0)
MCV: 100.7 fL — ABNORMAL HIGH (ref 80.0–100.0)
Monocytes Absolute: 0.5 10*3/uL (ref 0.1–1.0)
Monocytes Relative: 11 %
Neutro Abs: 3.3 10*3/uL (ref 1.7–7.7)
Neutrophils Relative %: 67 %
Platelet Count: 107 10*3/uL — ABNORMAL LOW (ref 150–400)
RBC: 3.06 MIL/uL — ABNORMAL LOW (ref 3.87–5.11)
RDW: 16.1 % — ABNORMAL HIGH (ref 11.5–15.5)
WBC Count: 4.9 10*3/uL (ref 4.0–10.5)
nRBC: 0 % (ref 0.0–0.2)

## 2021-07-13 MED ORDER — SODIUM CHLORIDE 0.9 % IV SOLN
10.0000 mg | Freq: Once | INTRAVENOUS | Status: AC
  Administered 2021-07-13: 10 mg via INTRAVENOUS
  Filled 2021-07-13: qty 1

## 2021-07-13 MED ORDER — SODIUM CHLORIDE 0.9 % IV SOLN
2500.0000 mg | INTRAVENOUS | Status: DC
  Administered 2021-07-13: 2500 mg via INTRAVENOUS
  Filled 2021-07-13: qty 50

## 2021-07-13 MED ORDER — PALONOSETRON HCL INJECTION 0.25 MG/5ML
0.2500 mg | Freq: Once | INTRAVENOUS | Status: AC
  Administered 2021-07-13: 0.25 mg via INTRAVENOUS
  Filled 2021-07-13: qty 5

## 2021-07-13 MED ORDER — SODIUM CHLORIDE 0.9 % IV SOLN
300.0000 mg | Freq: Once | INTRAVENOUS | Status: AC
  Administered 2021-07-13: 300 mg via INTRAVENOUS
  Filled 2021-07-13: qty 12

## 2021-07-13 MED ORDER — SODIUM CHLORIDE 0.9 % IV SOLN: Freq: Once | INTRAVENOUS | Status: AC

## 2021-07-13 MED ORDER — SODIUM CHLORIDE 0.9 % IV SOLN
140.0000 mg/m2 | Freq: Once | INTRAVENOUS | Status: AC
  Administered 2021-07-13: 220 mg via INTRAVENOUS
  Filled 2021-07-13: qty 11

## 2021-07-13 MED ORDER — SODIUM CHLORIDE 0.9 % IV SOLN
200.0000 mg/m2 | Freq: Once | INTRAVENOUS | Status: AC
  Administered 2021-07-13: 318 mg via INTRAVENOUS
  Filled 2021-07-13: qty 15.9

## 2021-07-13 MED ORDER — ATROPINE SULFATE 1 MG/ML IV SOLN
0.5000 mg | Freq: Once | INTRAVENOUS | Status: AC | PRN
  Administered 2021-07-13: 0.5 mg via INTRAVENOUS
  Filled 2021-07-13: qty 1

## 2021-07-13 NOTE — Progress Notes (Signed)
[facility] OFFICE PROGRESS NOTE   Diagnosis: Colon cancer  INTERVAL HISTORY:   Stacy Burns completed another cycle of FOLFIRI/Avastin on 06/15/2021.  No nausea/vomiting or mouth sores.  She reports a few days of diarrhea following chemotherapy, improved with Imodium.  She continues Lovenox anticoagulation.  No bleeding.  Bruising has improved with the lower Lovenox dose.  Objective:  Vital signs in last 24 hours:  Blood pressure 132/60, pulse 62, temperature 98.2 F (36.8 C), temperature source Oral, resp. rate 18, height 5' 7" (1.702 m), weight 125 lb 12.8 oz (57.1 kg), SpO2 100 %.    HEENT: No thrush or ulcers Resp: Lungs clear bilaterally Cardio: Regular rate and rhythm GI: No hepatosplenomegaly, soft fullness in the right mid lateral abdomen Vascular: No leg edema  Skin: Ecchymoses at the forearms  Portacath/PICC-without erythema  Lab Results:  Lab Results  Component Value Date   WBC 4.9 07/13/2021   HGB 9.7 (L) 07/13/2021   HCT 30.8 (L) 07/13/2021   MCV 100.7 (H) 07/13/2021   PLT 107 (L) 07/13/2021   NEUTROABS 3.3 07/13/2021    CMP  Lab Results  Component Value Date   NA 141 06/15/2021   K 4.2 06/15/2021   CL 109 06/15/2021   CO2 25 06/15/2021   GLUCOSE 57 (L) 06/15/2021   BUN 20 06/15/2021   CREATININE 0.57 06/15/2021   CALCIUM 8.7 (L) 06/15/2021   PROT 5.9 (L) 06/15/2021   ALBUMIN 3.3 (L) 06/15/2021   AST 41 06/15/2021   ALT 37 06/15/2021   ALKPHOS 169 (H) 06/15/2021   BILITOT 0.4 06/15/2021   GFRNONAA >60 06/15/2021   GFRAA >60 05/06/2020    Lab Results  Component Value Date   CEA1 54.57 (H) 12/30/2020   CEA 26.59 (H) 06/15/2021    Medications: I have reviewed the patient's current medications.   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  Cirrhosis.  Innumerable liver lesions. Colonoscopy  12/18/2019-ulcerated partially obstructing large mass in the mid ascending colon.  Biopsy invasive adenocarcinoma; preserved expression major MMR proteins Upper endoscopy 12/18/2019-normal esophagus, stomach, examined duodenum CEA 12/19/2019-8,923 Biopsy liver lesion 12/23/2019-adenocarcinoma Foundation 1-K-ras wild-type; tumor mutation burden greater than or equal to 10; microsatellite stable; BRAF V600E Cycle 1 FOLFOX 01/01/2020 Cycle 2 FOLFOX 01/15/2020  Cycle 3 FOLFOX 01/29/2020, Udenyca added Cycle 4 FOLFOX 02/12/2020, Udenyca held Cycle 5 FOLFOX 02/26/2020, Udenyca Cycle 6 FOLFOX 03/11/2020, Udenyca held CT abdomen/pelvis 03/16/2020-stable diffuse liver lesions, stable strictured appearing ascending colon, possible pericolonic implant, vague left lower lobe nodule Cycle 1 FOLFIRI/Avastin 04/06/2020 Cycle 2 FOLFIRI/Avastin 04/21/2020 Cycle 3 FOLFIRI/Avastin 05/06/2020 Cycle 4 FOLFIRI/Avastin 05/20/2020 Cycle 5 FOLFIRI/Avastin 06/03/2020 CTs 06/15/2020-mild to moderate improvement in hepatic metastasis.  No new or progressive disease.  Narrowing within the proximal ascending colon with upstream mild cecal and terminal ileum dilatation, similar. Cycle 6 FOLFIRI/Avastin 06/17/2020 Cycle 7 FOLFIRI/Avastin 07/01/2020 Cycle 8 FOLFIRI/Avastin 07/15/2020 Cycle 9 FOLFIRI/Avastin 07/29/2020-5-FU bolus eliminated, 5-FU infusion and irinotecan dose reduced Cycle 10 FOLFIRI/Avastin 08/12/2020 CTs 08/23/2020- mild residual wall thickening involving the cecum/ascending colon.  Numerous liver metastases improved. Cycle 11 FOLFIRI/Avastin 08/25/2020 Cycle 12 FOLFIRI/Avastin 09/16/2020 Cycle 13 FOLFIRI/Avastin 10/07/2020 Cycle 14 FOLFIRI/Avastin 10/28/2020 Cycle 15 FOLFIRI/Avastin 11/18/2020 CT abdomen/pelvis 12/06/2020-mild improvement in multifocal hepatic metastases, wall thickening at the ascending colon with pericolonic inflammatory changes Cycle 16 FOLFIRI/Avastin 12/09/2020 Cycle 17 FOLFIRI/Avastin 12/30/2020 Cycle 18  FOLFIRI/Avastin 01/20/2021 Cycle 19 FOLFIRI/Avastin 02/10/2021 Cycle 20 FOLFIRI/Avastin 03/03/2021 Cycle 21  FOLFIRI/Avastin 03/24/2021 Cycle 22 FOLFIRI/Avastin 04/14/2021 CTs 05/04/2021-slight improvement in hepatic metastatic disease. Cycle 23 FOLFIRI/Avastin 05/05/2021 Cycle 24 FOLFIRI/Avastin 05/25/2021 Cycle 25 FOLFIRI/Avastin 06/15/2021 Cycle 26 FOLFIRI/Avastin 07/13/2021 Anemia secondary to GI blood loss, on oral iron BID  Cirrhosis 12/26/2019-hospital admission for right lower extremity DVT and GI bleed, treated with heparin anticoagulation beginning 12/26/2019, converted to Lovenox at discharge 12/29/2019 Lovenox reduced to 40 mg daily 02/10/2021 secondary to bruising and nosebleeding History of low-grade fever-likely "tumor" fever COVID-19 infection January 2021     Disposition: Stacy Burns appears stable.  She will complete another cycle of FOLFIRI/Avastin today.  She will return for an office visit in the next cycle of chemotherapy on 08/10/2021.  We will plan for a restaging CT evaluation in January or February.  [name], MD  07/13/2021  8:47 AM

## 2021-07-13 NOTE — Progress Notes (Signed)
Patient presents for treatment. RN assessment completed along with the following:  Labs/vitals reviewed - Yes, and within treatment parameters.   Weight within 10% of previous measurement - Yes Oncology Treatment Attestation completed for current therapy- Yes, on date 03/25/2020 Informed consent completed and reflects current therapy/intent - Yes, on date 04/21/2020             Provider progress note reviewed - Yes, today's provider note was reviewed. Treatment/Antibody/Supportive plan reviewed - Yes, and there are no adjustments needed for today's treatment. S&H and other orders reviewed - Yes, and there are no additional orders identified. Previous treatment date reviewed - Yes, and the appropriate amount of time has elapsed between treatments. Clinic Hand Off Received from - Yes, [facility] Nurse Note.  Patient to proceed with treatment.

## 2021-07-13 NOTE — Telephone Encounter (Signed)
Patient seen by Dr. Sherrill today ? ?Vitals are within treatment parameters. ? ?Labs reviewed by Dr. Sherrill and are within treatment parameters. ? ?Per physician team, patient is ready for treatment and there are NO modifications to the treatment plan.  ?

## 2021-07-13 NOTE — Patient Instructions (Addendum)
[facility]   Discharge Instructions: Thank you for choosing [facility] to provide your oncology and hematology care.   If you have a lab appointment with the [facility], please go directly to the [facility] and check in at the registration area.   Wear comfortable clothing and clothing appropriate for easy access to any Portacath or PICC line.   We strive to give you quality time with your provider. You may need to reschedule your appointment if you arrive late (15 or more minutes).  Arriving late affects you and other patients whose appointments are after yours.  Also, if you miss three or more appointments without notifying the office, you may be dismissed from the clinic at the provider's discretion.      For prescription refill requests, have your pharmacy contact our office and allow 72 hours for refills to be completed.    Today you received the following chemotherapy and/or immunotherapy agents Bevacizumab-bvzr (ZIRABEV), Irinotecan (CAMPTOSAR), Leucovorin & Flourouracil (ADRUCIL).      To help prevent nausea and vomiting after your treatment, we encourage you to take your nausea medication as directed.  BELOW ARE SYMPTOMS THAT SHOULD BE REPORTED IMMEDIATELY: *FEVER GREATER THAN 100.4 F (38 C) OR HIGHER *CHILLS OR SWEATING *NAUSEA AND VOMITING THAT IS NOT CONTROLLED WITH YOUR NAUSEA MEDICATION *UNUSUAL SHORTNESS OF BREATH *UNUSUAL BRUISING OR BLEEDING *URINARY PROBLEMS (pain or burning when urinating, or frequent urination) *BOWEL PROBLEMS (unusual diarrhea, constipation, pain near the anus) TENDERNESS IN MOUTH AND THROAT WITH OR WITHOUT PRESENCE OF ULCERS (sore throat, sores in mouth, or a toothache) UNUSUAL RASH, SWELLING OR PAIN  UNUSUAL VAGINAL DISCHARGE OR ITCHING   Items with * indicate a potential emergency and should be followed up as soon as possible or go to the Emergency Department if any problems should occur.  Please  show the CHEMOTHERAPY ALERT CARD or IMMUNOTHERAPY ALERT CARD at check-in to the Emergency Department and triage nurse.  Should you have questions after your visit or need to cancel or reschedule your appointment, please contact [facility]  Dept: [phone]  and follow the prompts.  Office hours are 8:00 a.m. to 4:30 p.m. Monday - Friday. Please note that voicemails left after 4:00 p.m. may not be returned until the following business day.  We are closed weekends and major holidays. You have access to a nurse at all times for urgent questions. Please call the main number to the clinic Dept: [phone] and follow the prompts.   For any non-urgent questions, you may also contact your provider using MyChart. We now offer e-Visits for anyone 44 and older to request care online for non-urgent symptoms. For details visit [email or web address].   Also download the MyChart app! Go to the app store, search "MyChart", open the app, select [facility], and log in with your MyChart username and password.  Due to Covid, a mask is required upon entering the hospital/clinic. If you do not have a mask, one will be given to you upon arrival. For doctor visits, patients may have 1 support person aged 76 or older with them. For treatment visits, patients cannot have anyone with them due to current Covid guidelines and our immunocompromised population.   Bevacizumab injection What is this medication? BEVACIZUMAB (be va SIZ yoo mab) is a monoclonal antibody. It is used to treat many types of cancer. This medicine may be used for other purposes; ask your health care provider or pharmacist if  you have questions. COMMON BRAND NAME(S): Alymsys, Avastin, MVASI, [name] What should I tell my care team before I take this medication? They need to know if you have any of these conditions: diabetes heart disease high blood pressure history of coughing up blood prior anthracycline chemotherapy  (e.g., doxorubicin, daunorubicin, epirubicin) recent or ongoing radiation therapy recent or planning to have surgery stroke an unusual or allergic reaction to bevacizumab, hamster proteins, mouse proteins, other medicines, foods, dyes, or preservatives pregnant or trying to get pregnant breast-feeding How should I use this medication? This medicine is for infusion into a vein. It is given by a health care professional in a hospital or clinic setting. Talk to your pediatrician regarding the use of this medicine in children. Special care may be needed. Overdosage: If you think you have taken too much of this medicine contact a poison control center or emergency room at once. NOTE: This medicine is only for you. Do not share this medicine with others. What if I miss a dose? It is important not to miss your dose. Call your doctor or health care professional if you are unable to keep an appointment. What may interact with this medication? Interactions are not expected. This list may not describe all possible interactions. Give your health care provider a list of all the medicines, herbs, non-prescription drugs, or dietary supplements you use. Also tell them if you smoke, drink alcohol, or use illegal drugs. Some items may interact with your medicine. What should I watch for while using this medication? Your condition will be monitored carefully while you are receiving this medicine. You will need important blood work and urine testing done while you are taking this medicine. This medicine may increase your risk to bruise or bleed. Call your doctor or health care professional if you notice any unusual bleeding. Before having surgery, talk to your health care provider to make sure it is ok. This drug can increase the risk of poor healing of your surgical site or wound. You will need to stop this drug for 28 days before surgery. After surgery, wait at least 28 days before restarting this drug. Make sure  the surgical site or wound is healed enough before restarting this drug. Talk to your health care provider if questions. Do not become pregnant while taking this medicine or for 6 months after stopping it. Women should inform their doctor if they wish to become pregnant or think they might be pregnant. There is a potential for serious side effects to an unborn child. Talk to your health care professional or pharmacist for more information. Do not breast-feed an infant while taking this medicine and for 6 months after the last dose. This medicine has caused ovarian failure in some women. This medicine may interfere with the ability to have a child. You should talk to your doctor or health care professional if you are concerned about your fertility. What side effects may I notice from receiving this medication? Side effects that you should report to your doctor or health care professional as soon as possible: allergic reactions like skin rash, itching or hives, swelling of the face, lips, or tongue chest pain or chest tightness chills coughing up blood high fever seizures severe constipation signs and symptoms of bleeding such as bloody or black, tarry stools; red or dark-Podesta urine; spitting up blood or [place] material that looks like coffee grounds; red spots on the skin; unusual bruising or bleeding from the eye, gums, or nose signs and symptoms  of a blood clot such as breathing problems; chest pain; severe, sudden headache; pain, swelling, warmth in the leg signs and symptoms of a stroke like changes in vision; confusion; trouble speaking or understanding; severe headaches; sudden numbness or weakness of the face, arm or leg; trouble walking; dizziness; loss of balance or coordination stomach pain sweating swelling of legs or ankles vomiting weight gain Side effects that usually do not require medical attention (report to your doctor or health care professional if they continue or are  bothersome): back pain changes in taste decreased appetite dry skin nausea tiredness This list may not describe all possible side effects. Call your doctor for medical advice about side effects. You may report side effects to FDA at 1-800-FDA-1088. Where should I keep my medication? This drug is given in a hospital or clinic and will not be stored at home. NOTE: This sheet is a summary. It may not cover all possible information. If you have questions about this medicine, talk to your doctor, pharmacist, or health care provider.  2022 Elsevier/Gold Standard (2021-04-19 00:00:00)  Irinotecan injection What is this medication? IRINOTECAN (ir in oh TEE kan ) is a chemotherapy drug. It is used to treat colon and rectal cancer. This medicine may be used for other purposes; ask your health care provider or pharmacist if you have questions. COMMON BRAND NAME(S): Camptosar What should I tell my care team before I take this medication? They need to know if you have any of these conditions: dehydration diarrhea infection (especially a virus infection such as chickenpox, cold sores, or herpes) liver disease low blood counts, like low white cell, platelet, or red cell counts low levels of calcium, magnesium, or potassium in the blood recent or ongoing radiation therapy an unusual or allergic reaction to irinotecan, other medicines, foods, dyes, or preservatives pregnant or trying to get pregnant breast-feeding How should I use this medication? This drug is given as an infusion into a vein. It is administered in a hospital or clinic by a specially trained health care professional. Talk to your pediatrician regarding the use of this medicine in children. Special care may be needed. Overdosage: If you think you have taken too much of this medicine contact a poison control center or emergency room at once. NOTE: This medicine is only for you. Do not share this medicine with others. What if I miss  a dose? It is important not to miss your dose. Call your doctor or health care professional if you are unable to keep an appointment. What may interact with this medication? Do not take this medicine with any of the following medications: cobicistat itraconazole This medicine may interact with the following medications: antiviral medicines for HIV or AIDS certain antibiotics like rifampin or rifabutin certain medicines for fungal infections like ketoconazole, posaconazole, and voriconazole certain medicines for seizures like carbamazepine, phenobarbital, phenotoin clarithromycin gemfibrozil nefazodone St. John's Wort This list may not describe all possible interactions. Give your health care provider a list of all the medicines, herbs, non-prescription drugs, or dietary supplements you use. Also tell them if you smoke, drink alcohol, or use illegal drugs. Some items may interact with your medicine. What should I watch for while using this medication? Your condition will be monitored carefully while you are receiving this medicine. You will need important blood work done while you are taking this medicine. This drug may make you feel generally unwell. This is not uncommon, as chemotherapy can affect healthy cells as well as cancer cells.  Report any side effects. Continue your course of treatment even though you feel ill unless your doctor tells you to stop. In some cases, you may be given additional medicines to help with side effects. Follow all directions for their use. You may get drowsy or dizzy. Do not drive, use machinery, or do anything that needs mental alertness until you know how this medicine affects you. Do not stand or sit up quickly, especially if you are an older patient. This reduces the risk of dizzy or fainting spells. Call your health care professional for advice if you get a fever, chills, or sore throat, or other symptoms of a cold or flu. Do not treat yourself. This medicine  decreases your body's ability to fight infections. Try to avoid being around people who are sick. Avoid taking products that contain aspirin, acetaminophen, ibuprofen, naproxen, or ketoprofen unless instructed by your doctor. These medicines may hide a fever. This medicine may increase your risk to bruise or bleed. Call your doctor or health care professional if you notice any unusual bleeding. Be careful brushing and flossing your teeth or using a toothpick because you may get an infection or bleed more easily. If you have any dental work done, tell your dentist you are receiving this medicine. Do not become pregnant while taking this medicine or for 6 months after stopping it. Women should inform their health care professional if they wish to become pregnant or think they might be pregnant. Men should not father a child while taking this medicine and for 3 months after stopping it. There is potential for serious side effects to an unborn child. Talk to your health care professional for more information. Do not breast-feed an infant while taking this medicine or for 7 days after stopping it. This medicine has caused ovarian failure in some women. This medicine may make it more difficult to get pregnant. Talk to your health care professional if you are concerned about your fertility. This medicine has caused decreased sperm counts in some men. This may make it more difficult to father a child. Talk to your health care professional if you are concerned about your fertility. What side effects may I notice from receiving this medication? Side effects that you should report to your doctor or health care professional as soon as possible: allergic reactions like skin rash, itching or hives, swelling of the face, lips, or tongue chest pain diarrhea flushing, runny nose, sweating during infusion low blood counts - this medicine may decrease the number of white blood cells, red blood cells and platelets. You  may be at increased risk for infections and bleeding. nausea, vomiting pain, swelling, warmth in the leg signs of decreased platelets or bleeding - bruising, pinpoint red spots on the skin, black, tarry stools, blood in the urine signs of infection - fever or chills, cough, sore throat, pain or difficulty passing urine signs of decreased red blood cells - unusually weak or tired, fainting spells, lightheadedness Side effects that usually do not require medical attention (report to your doctor or health care professional if they continue or are bothersome): constipation hair loss headache loss of appetite mouth sores stomach pain This list may not describe all possible side effects. Call your doctor for medical advice about side effects. You may report side effects to FDA at 1-800-FDA-1088. Where should I keep my medication? This drug is given in a hospital or clinic and will not be stored at home. NOTE: This sheet is a summary. It may  not cover all possible information. If you have questions about this medicine, talk to your doctor, pharmacist, or health care provider.  2022 Elsevier/Gold Standard (2021-04-19 00:00:00)  Leucovorin injection What is this medication? LEUCOVORIN (loo koe VOR in) is used to prevent or treat the harmful effects of some medicines. This medicine is used to treat anemia caused by a low amount of folic acid in the body. It is also used with 5-fluorouracil (5-FU) to treat colon cancer. This medicine may be used for other purposes; ask your health care provider or pharmacist if you have questions. What should I tell my care team before I take this medication? They need to know if you have any of these conditions: anemia from low levels of vitamin B-12 in the blood an unusual or allergic reaction to leucovorin, folic acid, other medicines, foods, dyes, or preservatives pregnant or trying to get pregnant breast-feeding How should I use this medication? This  medicine is for injection into a muscle or into a vein. It is given by a health care professional in a hospital or clinic setting. Talk to your pediatrician regarding the use of this medicine in children. Special care may be needed. Overdosage: If you think you have taken too much of this medicine contact a poison control center or emergency room at once. NOTE: This medicine is only for you. Do not share this medicine with others. What if I miss a dose? This does not apply. What may interact with this medication? capecitabine fluorouracil phenobarbital phenytoin primidone trimethoprim-sulfamethoxazole This list may not describe all possible interactions. Give your health care provider a list of all the medicines, herbs, non-prescription drugs, or dietary supplements you use. Also tell them if you smoke, drink alcohol, or use illegal drugs. Some items may interact with your medicine. What should I watch for while using this medication? Your condition will be monitored carefully while you are receiving this medicine. This medicine may increase the side effects of 5-fluorouracil, 5-FU. Tell your doctor or health care professional if you have diarrhea or mouth sores that do not get better or that get worse. What side effects may I notice from receiving this medication? Side effects that you should report to your doctor or health care professional as soon as possible: allergic reactions like skin rash, itching or hives, swelling of the face, lips, or tongue breathing problems fever, infection mouth sores unusual bleeding or bruising unusually weak or tired Side effects that usually do not require medical attention (report to your doctor or health care professional if they continue or are bothersome): constipation or diarrhea loss of appetite nausea, vomiting This list may not describe all possible side effects. Call your doctor for medical advice about side effects. You may report side  effects to FDA at 1-800-FDA-1088. Where should I keep my medication? This drug is given in a hospital or clinic and will not be stored at home. NOTE: This sheet is a summary. It may not cover all possible information. If you have questions about this medicine, talk to your doctor, pharmacist, or health care provider.  2022 Elsevier/Gold Standard (2008-02-06 00:00:00)  Fluorouracil, 5-FU injection What is this medication? FLUOROURACIL, 5-FU (flure oh YOOR a sil) is a chemotherapy drug. It slows the growth of cancer cells. This medicine is used to treat many types of cancer like breast cancer, colon or rectal cancer, pancreatic cancer, and stomach cancer. This medicine may be used for other purposes; ask your health care provider or pharmacist if you have  questions. COMMON BRAND NAME(S): Adrucil What should I tell my care team before I take this medication? They need to know if you have any of these conditions: blood disorders dihydropyrimidine dehydrogenase (DPD) deficiency infection (especially a virus infection such as chickenpox, cold sores, or herpes) kidney disease liver disease malnourished, poor nutrition recent or ongoing radiation therapy an unusual or allergic reaction to fluorouracil, other chemotherapy, other medicines, foods, dyes, or preservatives pregnant or trying to get pregnant breast-feeding How should I use this medication? This drug is given as an infusion or injection into a vein. It is administered in a hospital or clinic by a specially trained health care professional. Talk to your pediatrician regarding the use of this medicine in children. Special care may be needed. Overdosage: If you think you have taken too much of this medicine contact a poison control center or emergency room at once. NOTE: This medicine is only for you. Do not share this medicine with others. What if I miss a dose? It is important not to miss your dose. Call your doctor or health care  professional if you are unable to keep an appointment. What may interact with this medication? Do not take this medicine with any of the following medications: live virus vaccines This medicine may also interact with the following medications: medicines that treat or prevent blood clots like warfarin, enoxaparin, and dalteparin This list may not describe all possible interactions. Give your health care provider a list of all the medicines, herbs, non-prescription drugs, or dietary supplements you use. Also tell them if you smoke, drink alcohol, or use illegal drugs. Some items may interact with your medicine. What should I watch for while using this medication? Visit your doctor for checks on your progress. This drug may make you feel generally unwell. This is not uncommon, as chemotherapy can affect healthy cells as well as cancer cells. Report any side effects. Continue your course of treatment even though you feel ill unless your doctor tells you to stop. In some cases, you may be given additional medicines to help with side effects. Follow all directions for their use. Call your doctor or health care professional for advice if you get a fever, chills or sore throat, or other symptoms of a cold or flu. Do not treat yourself. This drug decreases your body's ability to fight infections. Try to avoid being around people who are sick. This medicine may increase your risk to bruise or bleed. Call your doctor or health care professional if you notice any unusual bleeding. Be careful brushing and flossing your teeth or using a toothpick because you may get an infection or bleed more easily. If you have any dental work done, tell your dentist you are receiving this medicine. Avoid taking products that contain aspirin, acetaminophen, ibuprofen, naproxen, or ketoprofen unless instructed by your doctor. These medicines may hide a fever. Do not become pregnant while taking this medicine. Women should inform  their doctor if they wish to become pregnant or think they might be pregnant. There is a potential for serious side effects to an unborn child. Talk to your health care professional or pharmacist for more information. Do not breast-feed an infant while taking this medicine. Men should inform their doctor if they wish to father a child. This medicine may lower sperm counts. Do not treat diarrhea with over the counter products. Contact your doctor if you have diarrhea that lasts more than 2 days or if it is severe and watery. This medicine  can make you more sensitive to the sun. Keep out of the sun. If you cannot avoid being in the sun, wear protective clothing and use sunscreen. Do not use sun lamps or tanning beds/booths. What side effects may I notice from receiving this medication? Side effects that you should report to your doctor or health care professional as soon as possible: allergic reactions like skin rash, itching or hives, swelling of the face, lips, or tongue low blood counts - this medicine may decrease the number of white blood cells, red blood cells and platelets. You may be at increased risk for infections and bleeding. signs of infection - fever or chills, cough, sore throat, pain or difficulty passing urine signs of decreased platelets or bleeding - bruising, pinpoint red spots on the skin, black, tarry stools, blood in the urine signs of decreased red blood cells - unusually weak or tired, fainting spells, lightheadedness breathing problems changes in vision chest pain mouth sores nausea and vomiting pain, swelling, redness at site where injected pain, tingling, numbness in the hands or feet redness, swelling, or sores on hands or feet stomach pain unusual bleeding Side effects that usually do not require medical attention (report to your doctor or health care professional if they continue or are bothersome): changes in finger or toe nails diarrhea dry or itchy skin hair  loss headache loss of appetite sensitivity of eyes to the light stomach upset unusually teary eyes This list may not describe all possible side effects. Call your doctor for medical advice about side effects. You may report side effects to FDA at 1-800-FDA-1088. Where should I keep my medication? This drug is given in a hospital or clinic and will not be stored at home. NOTE: This sheet is a summary. It may not cover all possible information. If you have questions about this medicine, talk to your doctor, pharmacist, or health care provider.  2022 Elsevier/Gold Standard (2021-04-19 00:00:00)  The chemotherapy medication bag should finish at 46 hours, 96 hours, or 7 days. For example, if your pump is scheduled for 46 hours and it was put on at 4:00 p.m., it should finish at 2:00 p.m. the day it is scheduled to come off regardless of your appointment time.     Estimated time to finish at 11:00 a.m. on Friday 07/15/2021.   If the display on your pump reads "Low Volume" and it is beeping, take the batteries out of the pump and come to the cancer center for it to be taken off.   If the pump alarms go off prior to the pump reading "Low Volume" then call [phone] and someone can assist you.  If the plunger comes out and the chemotherapy medication is leaking out, please use your home chemo spill kit to clean up the spill. Do NOT use paper towels or other household products.  If you have problems or questions regarding your pump, please call either [phone] (24 hours a day) or the cancer center Monday-Friday 8:00 a.m.- 4:30 p.m. at the clinic number and we will assist you. If you are unable to get assistance, then go to the nearest Emergency Department and ask the staff to contact the IV team for assistance.

## 2021-07-15 ENCOUNTER — Other Ambulatory Visit: Payer: Self-pay

## 2021-07-15 ENCOUNTER — Inpatient Hospital Stay: Payer: BC Managed Care – PPO | Attending: Oncology

## 2021-07-15 VITALS — BP 137/55 | HR 60 | Temp 98.1°F | Resp 18

## 2021-07-15 DIAGNOSIS — K922 Gastrointestinal hemorrhage, unspecified: Secondary | ICD-10-CM | POA: Diagnosis not present

## 2021-07-15 DIAGNOSIS — Z5112 Encounter for antineoplastic immunotherapy: Secondary | ICD-10-CM | POA: Insufficient documentation

## 2021-07-15 DIAGNOSIS — C182 Malignant neoplasm of ascending colon: Secondary | ICD-10-CM | POA: Diagnosis not present

## 2021-07-15 DIAGNOSIS — Z452 Encounter for adjustment and management of vascular access device: Secondary | ICD-10-CM | POA: Diagnosis not present

## 2021-07-15 DIAGNOSIS — D500 Iron deficiency anemia secondary to blood loss (chronic): Secondary | ICD-10-CM | POA: Insufficient documentation

## 2021-07-15 DIAGNOSIS — Z5111 Encounter for antineoplastic chemotherapy: Secondary | ICD-10-CM | POA: Insufficient documentation

## 2021-07-15 DIAGNOSIS — K7460 Unspecified cirrhosis of liver: Secondary | ICD-10-CM | POA: Insufficient documentation

## 2021-07-15 DIAGNOSIS — C787 Secondary malignant neoplasm of liver and intrahepatic bile duct: Secondary | ICD-10-CM | POA: Insufficient documentation

## 2021-07-15 DIAGNOSIS — Z5189 Encounter for other specified aftercare: Secondary | ICD-10-CM | POA: Insufficient documentation

## 2021-07-15 MED ORDER — PEGFILGRASTIM-CBQV 6 MG/0.6ML ~~LOC~~ SOSY
6.0000 mg | PREFILLED_SYRINGE | Freq: Once | SUBCUTANEOUS | Status: AC
  Administered 2021-07-15: 6 mg via SUBCUTANEOUS

## 2021-07-15 MED ORDER — HEPARIN SOD (PORK) LOCK FLUSH 100 UNIT/ML IV SOLN
500.0000 [IU] | Freq: Once | INTRAVENOUS | Status: AC | PRN
  Administered 2021-07-15: 500 [IU]

## 2021-07-15 MED ORDER — SODIUM CHLORIDE 0.9% FLUSH
10.0000 mL | INTRAVENOUS | Status: DC | PRN
  Administered 2021-07-15: 10 mL

## 2021-07-27 DIAGNOSIS — H43813 Vitreous degeneration, bilateral: Secondary | ICD-10-CM | POA: Diagnosis not present

## 2021-08-02 ENCOUNTER — Ambulatory Visit: Payer: BC Managed Care – PPO

## 2021-08-02 ENCOUNTER — Ambulatory Visit: Payer: BC Managed Care – PPO | Admitting: Nurse Practitioner

## 2021-08-02 ENCOUNTER — Other Ambulatory Visit: Payer: BC Managed Care – PPO

## 2021-08-02 DIAGNOSIS — C182 Malignant neoplasm of ascending colon: Secondary | ICD-10-CM | POA: Diagnosis not present

## 2021-08-05 ENCOUNTER — Other Ambulatory Visit: Payer: Self-pay | Admitting: Oncology

## 2021-08-05 DIAGNOSIS — C182 Malignant neoplasm of ascending colon: Secondary | ICD-10-CM

## 2021-08-07 ENCOUNTER — Other Ambulatory Visit: Payer: Self-pay | Admitting: Oncology

## 2021-08-10 ENCOUNTER — Telehealth: Payer: Self-pay

## 2021-08-10 ENCOUNTER — Inpatient Hospital Stay: Payer: BC Managed Care – PPO

## 2021-08-10 ENCOUNTER — Inpatient Hospital Stay (HOSPITAL_BASED_OUTPATIENT_CLINIC_OR_DEPARTMENT_OTHER): Payer: BC Managed Care – PPO | Admitting: Oncology

## 2021-08-10 ENCOUNTER — Encounter: Payer: Self-pay | Admitting: Oncology

## 2021-08-10 ENCOUNTER — Other Ambulatory Visit: Payer: Self-pay

## 2021-08-10 VITALS — BP 154/59 | HR 67 | Temp 98.1°F | Resp 20 | Ht 67.0 in | Wt 124.2 lb

## 2021-08-10 VITALS — BP 134/60 | HR 65 | Temp 98.1°F | Resp 18

## 2021-08-10 DIAGNOSIS — D500 Iron deficiency anemia secondary to blood loss (chronic): Secondary | ICD-10-CM | POA: Diagnosis not present

## 2021-08-10 DIAGNOSIS — K7460 Unspecified cirrhosis of liver: Secondary | ICD-10-CM | POA: Diagnosis not present

## 2021-08-10 DIAGNOSIS — D649 Anemia, unspecified: Secondary | ICD-10-CM

## 2021-08-10 DIAGNOSIS — C182 Malignant neoplasm of ascending colon: Secondary | ICD-10-CM | POA: Diagnosis not present

## 2021-08-10 DIAGNOSIS — C787 Secondary malignant neoplasm of liver and intrahepatic bile duct: Secondary | ICD-10-CM | POA: Diagnosis not present

## 2021-08-10 DIAGNOSIS — Z452 Encounter for adjustment and management of vascular access device: Secondary | ICD-10-CM | POA: Diagnosis not present

## 2021-08-10 DIAGNOSIS — Z5111 Encounter for antineoplastic chemotherapy: Secondary | ICD-10-CM | POA: Diagnosis not present

## 2021-08-10 DIAGNOSIS — K922 Gastrointestinal hemorrhage, unspecified: Secondary | ICD-10-CM | POA: Diagnosis not present

## 2021-08-10 DIAGNOSIS — Z5112 Encounter for antineoplastic immunotherapy: Secondary | ICD-10-CM | POA: Diagnosis not present

## 2021-08-10 DIAGNOSIS — Z5189 Encounter for other specified aftercare: Secondary | ICD-10-CM | POA: Diagnosis not present

## 2021-08-10 LAB — CBC WITH DIFFERENTIAL (CANCER CENTER ONLY)
Abs Immature Granulocytes: 0.01 10*3/uL (ref 0.00–0.07)
Basophils Absolute: 0 10*3/uL (ref 0.0–0.1)
Basophils Relative: 1 %
Eosinophils Absolute: 0.1 10*3/uL (ref 0.0–0.5)
Eosinophils Relative: 2 %
HCT: 31.8 % — ABNORMAL LOW (ref 36.0–46.0)
Hemoglobin: 9.9 g/dL — ABNORMAL LOW (ref 12.0–15.0)
Immature Granulocytes: 0 %
Lymphocytes Relative: 17 %
Lymphs Abs: 0.9 10*3/uL (ref 0.7–4.0)
MCH: 31.5 pg (ref 26.0–34.0)
MCHC: 31.1 g/dL (ref 30.0–36.0)
MCV: 101.3 fL — ABNORMAL HIGH (ref 80.0–100.0)
Monocytes Absolute: 0.8 10*3/uL (ref 0.1–1.0)
Monocytes Relative: 15 %
Neutro Abs: 3.4 10*3/uL (ref 1.7–7.7)
Neutrophils Relative %: 65 %
Platelet Count: 127 10*3/uL — ABNORMAL LOW (ref 150–400)
RBC: 3.14 MIL/uL — ABNORMAL LOW (ref 3.87–5.11)
RDW: 16.4 % — ABNORMAL HIGH (ref 11.5–15.5)
WBC Count: 5.2 10*3/uL (ref 4.0–10.5)
nRBC: 0 % (ref 0.0–0.2)

## 2021-08-10 LAB — CMP (CANCER CENTER ONLY)
ALT: 25 U/L (ref 0–44)
AST: 22 U/L (ref 15–41)
Albumin: 3.3 g/dL — ABNORMAL LOW (ref 3.5–5.0)
Alkaline Phosphatase: 144 U/L — ABNORMAL HIGH (ref 38–126)
Anion gap: 5 (ref 5–15)
BUN: 25 mg/dL — ABNORMAL HIGH (ref 8–23)
CO2: 25 mmol/L (ref 22–32)
Calcium: 8.7 mg/dL — ABNORMAL LOW (ref 8.9–10.3)
Chloride: 113 mmol/L — ABNORMAL HIGH (ref 98–111)
Creatinine: 0.56 mg/dL (ref 0.44–1.00)
GFR, Estimated: >60 mL/min (ref >60)
Glucose, Bld: 74 mg/dL (ref 70–99)
Potassium: 4.1 mmol/L (ref 3.5–5.1)
Sodium: 143 mmol/L (ref 135–145)
Total Bilirubin: 0.4 mg/dL (ref 0.3–1.2)
Total Protein: 5.7 g/dL — ABNORMAL LOW (ref 6.5–8.1)

## 2021-08-10 LAB — CEA (ACCESS): CEA (CHCC): 45.71 ng/mL — ABNORMAL HIGH (ref 0.00–5.00)

## 2021-08-10 LAB — TOTAL PROTEIN, URINE DIPSTICK: Protein, ur: NEGATIVE mg/dL

## 2021-08-10 MED ORDER — SODIUM CHLORIDE 0.9 % IV SOLN
300.0000 mg | Freq: Once | INTRAVENOUS | Status: AC
  Administered 2021-08-10: 10:00:00 300 mg via INTRAVENOUS
  Filled 2021-08-10: qty 12

## 2021-08-10 MED ORDER — PALONOSETRON HCL INJECTION 0.25 MG/5ML
0.2500 mg | Freq: Once | INTRAVENOUS | Status: AC
  Administered 2021-08-10: 10:00:00 0.25 mg via INTRAVENOUS
  Filled 2021-08-10: qty 5

## 2021-08-10 MED ORDER — SODIUM CHLORIDE 0.9 % IV SOLN
140.0000 mg/m2 | Freq: Once | INTRAVENOUS | Status: AC
  Administered 2021-08-10: 11:00:00 220 mg via INTRAVENOUS
  Filled 2021-08-10: qty 10

## 2021-08-10 MED ORDER — SODIUM CHLORIDE 0.9 % IV SOLN
10.0000 mg | Freq: Once | INTRAVENOUS | Status: AC
  Administered 2021-08-10: 10:00:00 10 mg via INTRAVENOUS
  Filled 2021-08-10: qty 1

## 2021-08-10 MED ORDER — OXYCODONE-ACETAMINOPHEN 5-325 MG PO TABS: 1.0000 | ORAL_TABLET | Freq: Three times a day (TID) | ORAL | 0 refills | Status: DC | PRN

## 2021-08-10 MED ORDER — ATROPINE SULFATE 1 MG/ML IV SOLN
0.5000 mg | Freq: Once | INTRAVENOUS | Status: AC | PRN
  Administered 2021-08-10: 10:00:00 0.5 mg via INTRAVENOUS
  Filled 2021-08-10: qty 1

## 2021-08-10 MED ORDER — SODIUM CHLORIDE 0.9 % IV SOLN
2500.0000 mg | INTRAVENOUS | Status: DC
  Administered 2021-08-10: 12:00:00 2500 mg via INTRAVENOUS
  Filled 2021-08-10: qty 50

## 2021-08-10 MED ORDER — SODIUM CHLORIDE 0.9 % IV SOLN: Freq: Once | INTRAVENOUS | Status: AC

## 2021-08-10 MED ORDER — SODIUM CHLORIDE 0.9 % IV SOLN
200.0000 mg/m2 | Freq: Once | INTRAVENOUS | Status: AC
  Administered 2021-08-10: 11:00:00 318 mg via INTRAVENOUS
  Filled 2021-08-10: qty 15.9

## 2021-08-10 NOTE — Patient Instructions (Addendum)
[place] The chemotherapy medication bag should finish at 46 hours, 96 hours, or 7 days. For example, if your pump is scheduled for 46 hours and it was put on at 4:00 p.m., it should finish at 2:00 p.m. the day it is scheduled to come off regardless of your appointment time.     Estimated time to finish at 10:30 Friday, August 12, 2021.   If the display on your pump reads "Low Volume" and it is beeping, take the batteries out of the pump and come to the cancer center for it to be taken off.   If the pump alarms go off prior to the pump reading "Low Volume" then call [phone] and someone can assist you.  If the plunger comes out and the chemotherapy medication is leaking out, please use your home chemo spill kit to clean up the spill. Do NOT use paper towels or other household products.  If you have problems or questions regarding your pump, please call either [phone] (24 hours a day) or the cancer center Monday-Friday 8:00 a.m.- 4:30 p.m. at the clinic number and we will assist you. If you are unable to get assistance, then go to the nearest Emergency Department and ask the staff to contact the IV team for assistance.    Discharge Instructions: Thank you for choosing [facility] to provide your oncology and hematology care.   If you have a lab appointment with the [facility], please go directly to the [facility] and check in at the registration area.   Wear comfortable clothing and clothing appropriate for easy access to any Portacath or PICC line.   We strive to give you quality time with your provider. You may need to reschedule your appointment if you arrive late (15 or more minutes).  Arriving late affects you and other patients whose appointments are after yours.  Also, if you miss three or more appointments without notifying the office, you may be dismissed from the clinic at the providers discretion.      For prescription  refill requests, have your pharmacy contact our office and allow 72 hours for refills to be completed.    Today you received the following chemotherapy and/or immunotherapy agents bevacizumab-bvzr, irinotecan, leucovorin, fluorouracil      To help prevent nausea and vomiting after your treatment, we encourage you to take your nausea medication as directed.  BELOW ARE SYMPTOMS THAT SHOULD BE REPORTED IMMEDIATELY: *FEVER GREATER THAN 100.4 F (38 C) OR HIGHER *CHILLS OR SWEATING *NAUSEA AND VOMITING THAT IS NOT CONTROLLED WITH YOUR NAUSEA MEDICATION *UNUSUAL SHORTNESS OF BREATH *UNUSUAL BRUISING OR BLEEDING *URINARY PROBLEMS (pain or burning when urinating, or frequent urination) *BOWEL PROBLEMS (unusual diarrhea, constipation, pain near the anus) TENDERNESS IN MOUTH AND THROAT WITH OR WITHOUT PRESENCE OF ULCERS (sore throat, sores in mouth, or a toothache) UNUSUAL RASH, SWELLING OR PAIN  UNUSUAL VAGINAL DISCHARGE OR ITCHING   Items with * indicate a potential emergency and should be followed up as soon as possible or go to the Emergency Department if any problems should occur.  Please show the CHEMOTHERAPY ALERT CARD or IMMUNOTHERAPY ALERT CARD at check-in to the Emergency Department and triage nurse.  Should you have questions after your visit or need to cancel or reschedule your appointment, please contact [facility]  Dept: [phone]  and follow the prompts.  Office hours are 8:00 a.m. to 4:30 p.m. Monday - Friday. Please note that  voicemails left after 4:00 p.m. may not be returned until the following business day.  We are closed weekends and major holidays. You have access to a nurse at all times for urgent questions. Please call the main number to the clinic Dept: [phone] and follow the prompts.   For any non-urgent questions, you may also contact your provider using MyChart. We now offer e-Visits for anyone 77 and older to request care online for  non-urgent symptoms. For details visit [email or web address].   Also download the MyChart app! Go to the app store, search "MyChart", open the app, select [facility], and log in with your MyChart username and password.  Due to Covid, a mask is required upon entering the hospital/clinic. If you do not have a mask, one will be given to you upon arrival. For doctor visits, patients may have 1 support person aged 6 or older with them. For treatment visits, patients cannot have anyone with them due to current Covid guidelines and our immunocompromised population.   Bevacizumab injection What is this medication? BEVACIZUMAB (be va SIZ yoo mab) is a monoclonal antibody. It is used to treat many types of cancer. This medicine may be used for other purposes; ask your health care provider or pharmacist if you have questions. COMMON BRAND NAME(S): Alymsys, Avastin, MVASI, [name] What should I tell my care team before I take this medication? They need to know if you have any of these conditions: diabetes heart disease high blood pressure history of coughing up blood prior anthracycline chemotherapy (e.g., doxorubicin, daunorubicin, epirubicin) recent or ongoing radiation therapy recent or planning to have surgery stroke an unusual or allergic reaction to bevacizumab, hamster proteins, mouse proteins, other medicines, foods, dyes, or preservatives pregnant or trying to get pregnant breast-feeding How should I use this medication? This medicine is for infusion into a vein. It is given by a health care professional in a hospital or clinic setting. Talk to your pediatrician regarding the use of this medicine in children. Special care may be needed. Overdosage: If you think you have taken too much of this medicine contact a poison control center or emergency room at once. NOTE: This medicine is only for you. Do not share this medicine with others. What if I miss a dose? It is important not to miss  your dose. Call your doctor or health care professional if you are unable to keep an appointment. What may interact with this medication? Interactions are not expected. This list may not describe all possible interactions. Give your health care provider a list of all the medicines, herbs, non-prescription drugs, or dietary supplements you use. Also tell them if you smoke, drink alcohol, or use illegal drugs. Some items may interact with your medicine. What should I watch for while using this medication? Your condition will be monitored carefully while you are receiving this medicine. You will need important blood work and urine testing done while you are taking this medicine. This medicine may increase your risk to bruise or bleed. Call your doctor or health care professional if you notice any unusual bleeding. Before having surgery, talk to your health care provider to make sure it is ok. This drug can increase the risk of poor healing of your surgical site or wound. You will need to stop this drug for 28 days before surgery. After surgery, wait at least 28 days before restarting this drug. Make sure the surgical site or wound is healed enough before restarting this drug. Talk to your health  care provider if questions. Do not become pregnant while taking this medicine or for 6 months after stopping it. Women should inform their doctor if they wish to become pregnant or think they might be pregnant. There is a potential for serious side effects to an unborn child. Talk to your health care professional or pharmacist for more information. Do not breast-feed an infant while taking this medicine and for 6 months after the last dose. This medicine has caused ovarian failure in some women. This medicine may interfere with the ability to have a child. You should talk to your doctor or health care professional if you are concerned about your fertility. What side effects may I notice from receiving this  medication? Side effects that you should report to your doctor or health care professional as soon as possible: allergic reactions like skin rash, itching or hives, swelling of the face, lips, or tongue chest pain or chest tightness chills coughing up blood high fever seizures severe constipation signs and symptoms of bleeding such as bloody or black, tarry stools; red or dark-Durell urine; spitting up blood or Acrey material that looks like coffee grounds; red spots on the skin; unusual bruising or bleeding from the eye, gums, or nose signs and symptoms of a blood clot such as breathing problems; chest pain; severe, sudden headache; pain, swelling, warmth in the leg signs and symptoms of a stroke like changes in vision; confusion; trouble speaking or understanding; severe headaches; sudden numbness or weakness of the face, arm or leg; trouble walking; dizziness; loss of balance or coordination stomach pain sweating swelling of legs or ankles vomiting weight gain Side effects that usually do not require medical attention (report to your doctor or health care professional if they continue or are bothersome): back pain changes in taste decreased appetite dry skin nausea tiredness This list may not describe all possible side effects. Call your doctor for medical advice about side effects. You may report side effects to FDA at 1-800-FDA-1088. Where should I keep my medication? This drug is given in a hospital or clinic and will not be stored at home. NOTE: This sheet is a summary. It may not cover all possible information. If you have questions about this medicine, talk to your doctor, pharmacist, or health care provider.  2022 Elsevier/Gold Standard (2021-04-19 00:00:00)  Irinotecan injection What is this medication? IRINOTECAN (ir in oh TEE kan ) is a chemotherapy drug. It is used to treat colon and rectal cancer. This medicine may be used for other purposes; ask your health care  provider or pharmacist if you have questions. COMMON BRAND NAME(S): Camptosar What should I tell my care team before I take this medication? They need to know if you have any of these conditions: dehydration diarrhea infection (especially a virus infection such as chickenpox, cold sores, or herpes) liver disease low blood counts, like low white cell, platelet, or red cell counts low levels of calcium, magnesium, or potassium in the blood recent or ongoing radiation therapy an unusual or allergic reaction to irinotecan, other medicines, foods, dyes, or preservatives pregnant or trying to get pregnant breast-feeding How should I use this medication? This drug is given as an infusion into a vein. It is administered in a hospital or clinic by a specially trained health care professional. Talk to your pediatrician regarding the use of this medicine in children. Special care may be needed. Overdosage: If you think you have taken too much of this medicine contact a poison control center  or emergency room at once. NOTE: This medicine is only for you. Do not share this medicine with others. What if I miss a dose? It is important not to miss your dose. Call your doctor or health care professional if you are unable to keep an appointment. What may interact with this medication? Do not take this medicine with any of the following medications: cobicistat itraconazole This medicine may interact with the following medications: antiviral medicines for HIV or AIDS certain antibiotics like rifampin or rifabutin certain medicines for fungal infections like ketoconazole, posaconazole, and voriconazole certain medicines for seizures like carbamazepine, phenobarbital, phenotoin clarithromycin gemfibrozil nefazodone St. John's Wort This list may not describe all possible interactions. Give your health care provider a list of all the medicines, herbs, non-prescription drugs, or dietary supplements you use.  Also tell them if you smoke, drink alcohol, or use illegal drugs. Some items may interact with your medicine. What should I watch for while using this medication? Your condition will be monitored carefully while you are receiving this medicine. You will need important blood work done while you are taking this medicine. This drug may make you feel generally unwell. This is not uncommon, as chemotherapy can affect healthy cells as well as cancer cells. Report any side effects. Continue your course of treatment even though you feel ill unless your doctor tells you to stop. In some cases, you may be given additional medicines to help with side effects. Follow all directions for their use. You may get drowsy or dizzy. Do not drive, use machinery, or do anything that needs mental alertness until you know how this medicine affects you. Do not stand or sit up quickly, especially if you are an older patient. This reduces the risk of dizzy or fainting spells. Call your health care professional for advice if you get a fever, chills, or sore throat, or other symptoms of a cold or flu. Do not treat yourself. This medicine decreases your body's ability to fight infections. Try to avoid being around people who are sick. Avoid taking products that contain aspirin, acetaminophen, ibuprofen, naproxen, or ketoprofen unless instructed by your doctor. These medicines may hide a fever. This medicine may increase your risk to bruise or bleed. Call your doctor or health care professional if you notice any unusual bleeding. Be careful brushing and flossing your teeth or using a toothpick because you may get an infection or bleed more easily. If you have any dental work done, tell your dentist you are receiving this medicine. Do not become pregnant while taking this medicine or for 6 months after stopping it. Women should inform their health care professional if they wish to become pregnant or think they might be pregnant. Men  should not father a child while taking this medicine and for 3 months after stopping it. There is potential for serious side effects to an unborn child. Talk to your health care professional for more information. Do not breast-feed an infant while taking this medicine or for 7 days after stopping it. This medicine has caused ovarian failure in some women. This medicine may make it more difficult to get pregnant. Talk to your health care professional if you are concerned about your fertility. This medicine has caused decreased sperm counts in some men. This may make it more difficult to father a child. Talk to your health care professional if you are concerned about your fertility. What side effects may I notice from receiving this medication? Side effects that you should  report to your doctor or health care professional as soon as possible: allergic reactions like skin rash, itching or hives, swelling of the face, lips, or tongue chest pain diarrhea flushing, runny nose, sweating during infusion low blood counts - this medicine may decrease the number of white blood cells, red blood cells and platelets. You may be at increased risk for infections and bleeding. nausea, vomiting pain, swelling, warmth in the leg signs of decreased platelets or bleeding - bruising, pinpoint red spots on the skin, black, tarry stools, blood in the urine signs of infection - fever or chills, cough, sore throat, pain or difficulty passing urine signs of decreased red blood cells - unusually weak or tired, fainting spells, lightheadedness Side effects that usually do not require medical attention (report to your doctor or health care professional if they continue or are bothersome): constipation hair loss headache loss of appetite mouth sores stomach pain This list may not describe all possible side effects. Call your doctor for medical advice about side effects. You may report side effects to FDA at  1-800-FDA-1088. Where should I keep my medication? This drug is given in a hospital or clinic and will not be stored at home. NOTE: This sheet is a summary. It may not cover all possible information. If you have questions about this medicine, talk to your doctor, pharmacist, or health care provider.  2022 Elsevier/Gold Standard (2021-04-19 00:00:00)  Leucovorin injection What is this medication? LEUCOVORIN (loo koe VOR in) is used to prevent or treat the harmful effects of some medicines. This medicine is used to treat anemia caused by a low amount of folic acid in the body. It is also used with 5-fluorouracil (5-FU) to treat colon cancer. This medicine may be used for other purposes; ask your health care provider or pharmacist if you have questions. What should I tell my care team before I take this medication? They need to know if you have any of these conditions: anemia from low levels of vitamin B-12 in the blood an unusual or allergic reaction to leucovorin, folic acid, other medicines, foods, dyes, or preservatives pregnant or trying to get pregnant breast-feeding How should I use this medication? This medicine is for injection into a muscle or into a vein. It is given by a health care professional in a hospital or clinic setting. Talk to your pediatrician regarding the use of this medicine in children. Special care may be needed. Overdosage: If you think you have taken too much of this medicine contact a poison control center or emergency room at once. NOTE: This medicine is only for you. Do not share this medicine with others. What if I miss a dose? This does not apply. What may interact with this medication? capecitabine fluorouracil phenobarbital phenytoin primidone trimethoprim-sulfamethoxazole This list may not describe all possible interactions. Give your health care provider a list of all the medicines, herbs, non-prescription drugs, or dietary supplements you use. Also  tell them if you smoke, drink alcohol, or use illegal drugs. Some items may interact with your medicine. What should I watch for while using this medication? Your condition will be monitored carefully while you are receiving this medicine. This medicine may increase the side effects of 5-fluorouracil, 5-FU. Tell your doctor or health care professional if you have diarrhea or mouth sores that do not get better or that get worse. What side effects may I notice from receiving this medication? Side effects that you should report to your doctor or health care professional  as soon as possible: allergic reactions like skin rash, itching or hives, swelling of the face, lips, or tongue breathing problems fever, infection mouth sores unusual bleeding or bruising unusually weak or tired Side effects that usually do not require medical attention (report to your doctor or health care professional if they continue or are bothersome): constipation or diarrhea loss of appetite nausea, vomiting This list may not describe all possible side effects. Call your doctor for medical advice about side effects. You may report side effects to FDA at 1-800-FDA-1088. Where should I keep my medication? This drug is given in a hospital or clinic and will not be stored at home. NOTE: This sheet is a summary. It may not cover all possible information. If you have questions about this medicine, talk to your doctor, pharmacist, or health care provider.  2022 Elsevier/Gold Standard (2008-02-06 00:00:00)  Fluorouracil, 5-FU injection What is this medication? FLUOROURACIL, 5-FU (flure oh YOOR a sil) is a chemotherapy drug. It slows the growth of cancer cells. This medicine is used to treat many types of cancer like breast cancer, colon or rectal cancer, pancreatic cancer, and stomach cancer. This medicine may be used for other purposes; ask your health care provider or pharmacist if you have questions. COMMON BRAND NAME(S):  Adrucil What should I tell my care team before I take this medication? They need to know if you have any of these conditions: blood disorders dihydropyrimidine dehydrogenase (DPD) deficiency infection (especially a virus infection such as chickenpox, cold sores, or herpes) kidney disease liver disease malnourished, poor nutrition recent or ongoing radiation therapy an unusual or allergic reaction to fluorouracil, other chemotherapy, other medicines, foods, dyes, or preservatives pregnant or trying to get pregnant breast-feeding How should I use this medication? This drug is given as an infusion or injection into a vein. It is administered in a hospital or clinic by a specially trained health care professional. Talk to your pediatrician regarding the use of this medicine in children. Special care may be needed. Overdosage: If you think you have taken too much of this medicine contact a poison control center or emergency room at once. NOTE: This medicine is only for you. Do not share this medicine with others. What if I miss a dose? It is important not to miss your dose. Call your doctor or health care professional if you are unable to keep an appointment. What may interact with this medication? Do not take this medicine with any of the following medications: live virus vaccines This medicine may also interact with the following medications: medicines that treat or prevent blood clots like warfarin, enoxaparin, and dalteparin This list may not describe all possible interactions. Give your health care provider a list of all the medicines, herbs, non-prescription drugs, or dietary supplements you use. Also tell them if you smoke, drink alcohol, or use illegal drugs. Some items may interact with your medicine. What should I watch for while using this medication? Visit your doctor for checks on your progress. This drug may make you feel generally unwell. This is not uncommon, as chemotherapy can  affect healthy cells as well as cancer cells. Report any side effects. Continue your course of treatment even though you feel ill unless your doctor tells you to stop. In some cases, you may be given additional medicines to help with side effects. Follow all directions for their use. Call your doctor or health care professional for advice if you get a fever, chills or sore throat, or other symptoms  of a cold or flu. Do not treat yourself. This drug decreases your body's ability to fight infections. Try to avoid being around people who are sick. This medicine may increase your risk to bruise or bleed. Call your doctor or health care professional if you notice any unusual bleeding. Be careful brushing and flossing your teeth or using a toothpick because you may get an infection or bleed more easily. If you have any dental work done, tell your dentist you are receiving this medicine. Avoid taking products that contain aspirin, acetaminophen, ibuprofen, naproxen, or ketoprofen unless instructed by your doctor. These medicines may hide a fever. Do not become pregnant while taking this medicine. Women should inform their doctor if they wish to become pregnant or think they might be pregnant. There is a potential for serious side effects to an unborn child. Talk to your health care professional or pharmacist for more information. Do not breast-feed an infant while taking this medicine. Men should inform their doctor if they wish to father a child. This medicine may lower sperm counts. Do not treat diarrhea with over the counter products. Contact your doctor if you have diarrhea that lasts more than 2 days or if it is severe and watery. This medicine can make you more sensitive to the sun. Keep out of the sun. If you cannot avoid being in the sun, wear protective clothing and use sunscreen. Do not use sun lamps or tanning beds/booths. What side effects may I notice from receiving this medication? Side effects  that you should report to your doctor or health care professional as soon as possible: allergic reactions like skin rash, itching or hives, swelling of the face, lips, or tongue low blood counts - this medicine may decrease the number of white blood cells, red blood cells and platelets. You may be at increased risk for infections and bleeding. signs of infection - fever or chills, cough, sore throat, pain or difficulty passing urine signs of decreased platelets or bleeding - bruising, pinpoint red spots on the skin, black, tarry stools, blood in the urine signs of decreased red blood cells - unusually weak or tired, fainting spells, lightheadedness breathing problems changes in vision chest pain mouth sores nausea and vomiting pain, swelling, redness at site where injected pain, tingling, numbness in the hands or feet redness, swelling, or sores on hands or feet stomach pain unusual bleeding Side effects that usually do not require medical attention (report to your doctor or health care professional if they continue or are bothersome): changes in finger or toe nails diarrhea dry or itchy skin hair loss headache loss of appetite sensitivity of eyes to the light stomach upset unusually teary eyes This list may not describe all possible side effects. Call your doctor for medical advice about side effects. You may report side effects to FDA at 1-800-FDA-1088. Where should I keep my medication? This drug is given in a hospital or clinic and will not be stored at home. NOTE: This sheet is a summary. It may not cover all possible information. If you have questions about this medicine, talk to your doctor, pharmacist, or health care provider.  2022 Elsevier/Gold Standard (2021-04-19 00:00:00)

## 2021-08-10 NOTE — Progress Notes (Signed)
[facility] OFFICE PROGRESS NOTE   Diagnosis: Colon cancer  INTERVAL HISTORY:   Stacy Burns was last treated with FOLFIRI/Avastin on 07/13/2021.  She reports intermittent diarrhea, improved with Imodium.  No bleeding.  She takes oxycodone for pain in the evening.  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  Objective:  Vital signs in last 24 hours:  Blood pressure (!) 154/59, pulse 67, temperature 98.1 F (36.7 C), temperature source Oral, resp. rate 20, height $RemoveBe'5\' 7"'BcalMmrWW$  (1.702 m), weight 124 lb 3.2 oz (56.3 kg), SpO2 100 %.    HEENT: No thrush or ulcers Resp: Lungs clear bilaterally Cardio: Regular rate and rhythm GI: No mass, no hepatosplenomegaly, nontender Vascular: No leg edema  Skin: Ecchymoses at the dorsum of the hands and lower arms  Portacath/PICC-without erythema  Lab Results:  Lab Results  Component Value Date   WBC 5.2 08/10/2021   HGB 9.9 (L) 08/10/2021   HCT 31.8 (L) 08/10/2021   MCV 101.3 (H) 08/10/2021   PLT 127 (L) 08/10/2021   NEUTROABS 3.4 08/10/2021    CMP  Lab Results  Component Value Date   NA 144 07/13/2021   K 4.1 07/13/2021   CL 112 (H) 07/13/2021   CO2 26 07/13/2021   GLUCOSE 86 07/13/2021   BUN 21 07/13/2021   CREATININE 0.54 07/13/2021   CALCIUM 9.2 07/13/2021   PROT 5.7 (L) 07/13/2021   ALBUMIN 3.5 07/13/2021   AST 34 07/13/2021   ALT 25 07/13/2021   ALKPHOS 132 (H) 07/13/2021   BILITOT 0.3 07/13/2021   GFRNONAA >60 07/13/2021   GFRAA >60 05/06/2020    Lab Results  Component Value Date   CEA1 54.57 (H) 12/30/2020   CEA 34.21 (H) 07/13/2021    Medications: I have reviewed the patient's current medications.   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  Cirrhosis.  Innumerable liver lesions. Colonoscopy 12/18/2019-ulcerated partially obstructing large mass in the mid ascending  colon.  Biopsy invasive adenocarcinoma; preserved expression major MMR proteins Upper endoscopy 12/18/2019-normal esophagus, stomach, examined duodenum CEA 12/19/2019-8,923 Biopsy liver lesion 12/23/2019-adenocarcinoma Foundation 1-K-ras wild-type; tumor mutation burden greater than or equal to 10; microsatellite stable; BRAF V600E Cycle 1 FOLFOX 01/01/2020 Cycle 2 FOLFOX 01/15/2020  Cycle 3 FOLFOX 01/29/2020, Udenyca added Cycle 4 FOLFOX 02/12/2020, Udenyca held Cycle 5 FOLFOX 02/26/2020, Udenyca Cycle 6 FOLFOX 03/11/2020, Udenyca held CT abdomen/pelvis 03/16/2020-stable diffuse liver lesions, stable strictured appearing ascending colon, possible pericolonic implant, vague left lower lobe nodule Cycle 1 FOLFIRI/Avastin 04/06/2020 Cycle 2 FOLFIRI/Avastin 04/21/2020 Cycle 3 FOLFIRI/Avastin 05/06/2020 Cycle 4 FOLFIRI/Avastin 05/20/2020 Cycle 5 FOLFIRI/Avastin 06/03/2020 CTs 06/15/2020-mild to moderate improvement in hepatic metastasis.  No new or progressive disease.  Narrowing within the proximal ascending colon with upstream mild cecal and terminal ileum dilatation, similar. Cycle 6 FOLFIRI/Avastin 06/17/2020 Cycle 7 FOLFIRI/Avastin 07/01/2020 Cycle 8 FOLFIRI/Avastin 07/15/2020 Cycle 9 FOLFIRI/Avastin 07/29/2020-5-FU bolus eliminated, 5-FU infusion and irinotecan dose reduced Cycle 10 FOLFIRI/Avastin 08/12/2020 CTs 08/23/2020- mild residual wall thickening involving the cecum/ascending colon.  Numerous liver metastases improved. Cycle 11 FOLFIRI/Avastin 08/25/2020 Cycle 12 FOLFIRI/Avastin 09/16/2020 Cycle 13 FOLFIRI/Avastin 10/07/2020 Cycle 14 FOLFIRI/Avastin 10/28/2020 Cycle 15 FOLFIRI/Avastin 11/18/2020 CT abdomen/pelvis 12/06/2020-mild improvement in multifocal hepatic metastases, wall thickening at the ascending colon with pericolonic inflammatory changes Cycle 16 FOLFIRI/Avastin 12/09/2020 Cycle 17 FOLFIRI/Avastin 12/30/2020 Cycle 18 FOLFIRI/Avastin 01/20/2021 Cycle 19 FOLFIRI/Avastin 02/10/2021 Cycle 20  FOLFIRI/Avastin 03/03/2021 Cycle 21 FOLFIRI/Avastin 03/24/2021 Cycle 22 FOLFIRI/Avastin 04/14/2021  CTs 05/04/2021-slight improvement in hepatic metastatic disease. Cycle 23 FOLFIRI/Avastin 05/05/2021 Cycle 24 FOLFIRI/Avastin 05/25/2021 Cycle 25 FOLFIRI/Avastin 06/15/2021 Cycle 26 FOLFIRI/Avastin 07/13/2021 Cycle 27 FOLFIRI/Avastin 08/02/2021 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  She will complete another cycle of FOLFIRI/Avastin today.  She will undergo a restaging CT evaluation prior to an office visit in 3 weeks. I refilled her prescription for oxycodone.  [name], MD  08/10/2021  8:42 AM

## 2021-08-10 NOTE — Progress Notes (Signed)
Patient presents for treatment. RN assessment completed along with the following:  Labs/vitals reviewed - Yes, and within treatment parameters.   Weight within 10% of previous measurement - Yes Oncology Treatment Attestation completed for current therapy- Yes, on date 03/25/20 Informed consent completed and reflects current therapy/intent - Yes, on date 04/21/20             Provider progress note reviewed - Yes, today's provider note was reviewed. Treatment/Antibody/Supportive plan reviewed - Yes, and there are no adjustments needed for today's treatment. S&H and other orders reviewed - Yes, and there are no additional orders identified. Previous treatment date reviewed - Yes, and the appropriate amount of time has elapsed between treatments. Clinic Hand Off Received from - Danae [place], LPN  Patient to proceed with treatment.

## 2021-08-10 NOTE — Telephone Encounter (Signed)
Patient seen by Dr. Sherrill today ? ?Vitals are within treatment parameters. ? ?Labs reviewed by Dr. Sherrill and are within treatment parameters. ? ?Per physician team, patient is ready for treatment and there are NO modifications to the treatment plan.  ?

## 2021-08-11 ENCOUNTER — Telehealth: Payer: Self-pay | Admitting: *Deleted

## 2021-08-11 NOTE — Telephone Encounter (Signed)
Notified patient of CT at [facility] on 08/30/21 at 0900 w/arrival at [facility]. NPO 4 hours prior and oral contrast at 0700 and 0800. Confirmed she has her contrast. Managed care notified.

## 2021-08-12 ENCOUNTER — Other Ambulatory Visit: Payer: Self-pay

## 2021-08-12 ENCOUNTER — Inpatient Hospital Stay: Payer: BC Managed Care – PPO

## 2021-08-12 VITALS — BP 131/44 | HR 59 | Temp 98.5°F | Resp 18

## 2021-08-12 DIAGNOSIS — Z452 Encounter for adjustment and management of vascular access device: Secondary | ICD-10-CM | POA: Diagnosis not present

## 2021-08-12 DIAGNOSIS — C182 Malignant neoplasm of ascending colon: Secondary | ICD-10-CM

## 2021-08-12 DIAGNOSIS — Z5189 Encounter for other specified aftercare: Secondary | ICD-10-CM | POA: Diagnosis not present

## 2021-08-12 DIAGNOSIS — D500 Iron deficiency anemia secondary to blood loss (chronic): Secondary | ICD-10-CM | POA: Diagnosis not present

## 2021-08-12 DIAGNOSIS — K922 Gastrointestinal hemorrhage, unspecified: Secondary | ICD-10-CM | POA: Diagnosis not present

## 2021-08-12 DIAGNOSIS — C787 Secondary malignant neoplasm of liver and intrahepatic bile duct: Secondary | ICD-10-CM | POA: Diagnosis not present

## 2021-08-12 DIAGNOSIS — Z5111 Encounter for antineoplastic chemotherapy: Secondary | ICD-10-CM | POA: Diagnosis not present

## 2021-08-12 DIAGNOSIS — Z5112 Encounter for antineoplastic immunotherapy: Secondary | ICD-10-CM | POA: Diagnosis not present

## 2021-08-12 DIAGNOSIS — K7460 Unspecified cirrhosis of liver: Secondary | ICD-10-CM | POA: Diagnosis not present

## 2021-08-12 MED ORDER — SODIUM CHLORIDE 0.9% FLUSH
10.0000 mL | INTRAVENOUS | Status: DC | PRN
  Administered 2021-08-12: 11:00:00 10 mL

## 2021-08-12 MED ORDER — HEPARIN SOD (PORK) LOCK FLUSH 100 UNIT/ML IV SOLN
500.0000 [IU] | Freq: Once | INTRAVENOUS | Status: AC | PRN
  Administered 2021-08-12: 11:00:00 500 [IU]

## 2021-08-12 MED ORDER — PEGFILGRASTIM-CBQV 6 MG/0.6ML ~~LOC~~ SOSY
6.0000 mg | PREFILLED_SYRINGE | Freq: Once | SUBCUTANEOUS | Status: AC
  Administered 2021-08-12: 11:00:00 6 mg via SUBCUTANEOUS

## 2021-08-12 NOTE — Patient Instructions (Signed)
Pegfilgrastim Injection What is this medication? PEGFILGRASTIM (PEG fil gra stim) lowers the risk of infection in people who are receiving chemotherapy. It works by Building control surveyor make more white blood cells, which protects your body from infection. It may also be used to help people who have been exposed to high doses of radiation. This medicine may be used for other purposes; ask your health care provider or pharmacist if you have questions. COMMON BRAND NAME(S): Rexene Edison, Ziextenzo What should I tell my care team before I take this medication? They need to know if you have any of these conditions: Kidney disease Latex allergy Ongoing radiation therapy Sickle cell disease Skin reactions to acrylic adhesives (On-Body Injector only) An unusual or allergic reaction to pegfilgrastim, filgrastim, other medications, foods, dyes, or preservatives Pregnant or trying to get pregnant Breast-feeding How should I use this medication? This medication is for injection under the skin. If you get this medication at home, you will be taught how to prepare and give the pre-filled syringe or how to use the On-body Injector. Refer to the patient Instructions for Use for detailed instructions. Use exactly as directed. Tell your care team immediately if you suspect that the On-body Injector may not have performed as intended or if you suspect the use of the On-body Injector resulted in a missed or partial dose. It is important that you put your used needles and syringes in a special sharps container. Do not put them in a trash can. If you do not have a sharps container, call your pharmacist or care team to get one. Talk to your care team about the use of this medication in children. While this medication may be prescribed for selected conditions, precautions do apply. Overdosage: If you think you have taken too much of this medicine contact a poison control center or emergency room at  once. NOTE: This medicine is only for you. Do not share this medicine with others. What if I miss a dose? It is important not to miss your dose. Call your care team if you miss your dose. If you miss a dose due to an On-body Injector failure or leakage, a new dose should be administered as soon as possible using a single prefilled syringe for manual use. What may interact with this medication? Interactions have not been studied. This list may not describe all possible interactions. Give your health care provider a list of all the medicines, herbs, non-prescription drugs, or dietary supplements you use. Also tell them if you smoke, drink alcohol, or use illegal drugs. Some items may interact with your medicine. What should I watch for while using this medication? Your condition will be monitored carefully while you are receiving this medication. You may need blood work done while you are taking this medication. Talk to your care team about your risk of cancer. You may be more at risk for certain types of cancer if you take this medication. If you are going to need a MRI, CT scan, or other procedure, tell your care team that you are using this medication (On-Body Injector only). What side effects may I notice from receiving this medication? Side effects that you should report to your care team as soon as possible: Allergic reactions--skin rash, itching, hives, swelling of the face, lips, tongue, or throat Capillary leak syndrome--stomach or muscle pain, unusual weakness or fatigue, feeling faint or lightheaded, decrease in the amount of urine, swelling of the ankles, hands, or feet, trouble breathing High  white blood cell level--fever, fatigue, trouble breathing, night sweats, change in vision, weight loss Inflammation of the aorta--fever, fatigue, back, chest, or stomach pain, severe headache Kidney injury (glomerulonephritis)--decrease in the amount of urine, red or dark Winsor urine, foamy or  bubbly urine, swelling of the ankles, hands, or feet Shortness of breath or trouble breathing Spleen injury--pain in upper left stomach or shoulder Unusual bruising or bleeding Side effects that usually do not require medical attention (report to your care team if they continue or are bothersome): Bone pain Pain in the hands or feet This list may not describe all possible side effects. Call your doctor for medical advice about side effects. You may report side effects to FDA at 1-800-FDA-1088. Where should I keep my medication? Keep out of the reach of children. If you are using this medication at home, you will be instructed on how to store it. Throw away any unused medication after the expiration date on the label. NOTE: This sheet is a summary. It may not cover all possible information. If you have questions about this medicine, talk to your doctor, pharmacist, or health care provider.  2022 Elsevier/Gold Standard (2021-04-19 00:00:00) Implanted [facility] Guide An implanted port is a device that is placed under the skin. It is usually placed in the chest. The device may vary based on the need. Implanted ports can be used to give IV medicine, to take blood, or to give fluids. You may have an implanted port if: You need IV medicine that would be irritating to the small veins in your hands or arms. You need IV medicines, such as chemotherapy, for a long period of time. You need IV nutrition for a long period of time. You may have fewer limitations when using a port than you would if you used other types of long-term IVs. You will also likely be able to return to normal activities after your incision heals. An implanted port has two main parts: Reservoir. The reservoir is the part where a needle is inserted to give medicines or draw blood. The reservoir is round. After the port is placed, it appears as a small, raised area under your skin. Catheter. The catheter is a small, thin tube that  connects the reservoir to a vein. Medicine that is inserted into the reservoir goes into the catheter and then into the vein. How is my port accessed? To access your port: A numbing cream may be placed on the skin over the port site. Your health care provider will put on a mask and sterile gloves. The skin over your port will be cleaned carefully with a germ-killing soap and allowed to dry. Your health care provider will gently pinch the port and insert a needle into it. Your health care provider will check for a blood return to make sure the port is in the vein and is still working (patent). If your port needs to remain accessed to get medicine continuously (constant infusion), your health care provider will place a clear bandage (dressing) over the needle site. The dressing and needle will need to be changed every week, or as told by your health care provider. What is flushing? Flushing helps keep the port working. Follow instructions from your health care provider about how and when to flush the port. Ports are usually flushed with saline solution or a medicine called heparin. The need for flushing will depend on how the port is used: If the port is only used from time to time to  give medicines or draw blood, the port may need to be flushed: Before and after medicines have been given. Before and after blood has been drawn. As part of routine maintenance. Flushing may be recommended every 4-6 weeks. If a constant infusion is running, the port may not need to be flushed. Throw away any syringes in a disposal container that is meant for sharp items (sharps container). You can buy a sharps container from a pharmacy, or you can make one by using an empty hard plastic bottle with a cover. How long will my port stay implanted? The port can stay in for as long as your health care provider thinks it is needed. When it is time for the port to come out, a surgery will be done to remove it. The surgery will  be similar to the procedure that was done to put the port in. Follow these instructions at home: Caring for your port and port site Flush your port as told by your health care provider. If you need an infusion over several days, follow instructions from your health care provider about how to take care of your port site. Make sure you: Change your dressing as told by your health care provider. Wash your hands with soap and water for at least 20 seconds before and after you change your dressing. If soap and water are not available, use alcohol-based hand sanitizer. Place any used dressings or infusion bags into a plastic bag. Throw that bag in the trash. Keep the dressing that covers the needle clean and dry. Do not get it wet. Do not use scissors or sharp objects near the infusion tubing. Keep any external tubes clamped, unless they are being used. Check your port site every day for signs of infection. Check for: Redness, swelling, or pain. Fluid or blood. Warmth. Pus or a bad smell. Protect the skin around the port site. Avoid wearing bra straps that rub or irritate the site. Protect the skin around your port from seat belts. Place a soft pad over your chest if needed. Bathe or shower as told by your health care provider. The site may get wet as long as you are not actively receiving an infusion. General instructions  Return to your normal activities as told by your health care provider. Ask your health care provider what activities are safe for you. Carry a medical alert card or wear a medical alert bracelet at all times. This will let health care providers know that you have an implanted port in case of an emergency. Where to find more information American Cancer Society: www.cancer.[place] of Clinical Oncology: www.cancer.net Contact a health care provider if: You have a fever or chills. You have redness, swelling, or pain at the port site. You have fluid or blood coming  from your port site. Your incision feels warm to the touch. You have pus or a bad smell coming from the port site. Summary Implanted ports are usually placed in the chest for long-term IV access. Follow instructions from your health care provider about flushing the port and changing bandages (dressings). Take care of the area around your port by avoiding clothing that puts pressure on the area, and by watching for signs of infection. Protect the skin around your port from seat belts. Place a soft pad over your chest if needed. Contact a health care provider if you have a fever or you have redness, swelling, pain, fluid, or a bad smell at the port site. This information  is not intended to replace advice given to you by your health care provider. Make sure you discuss any questions you have with your health care provider. Document Revised: 02/01/2021 Document Reviewed: 02/01/2021 Elsevier Patient Education  [place].

## 2021-08-28 ENCOUNTER — Other Ambulatory Visit: Payer: Self-pay | Admitting: Oncology

## 2021-08-29 ENCOUNTER — Other Ambulatory Visit: Payer: Self-pay | Admitting: Oncology

## 2021-08-30 ENCOUNTER — Inpatient Hospital Stay: Payer: BC Managed Care – PPO | Attending: Oncology

## 2021-08-30 ENCOUNTER — Inpatient Hospital Stay: Payer: BC Managed Care – PPO

## 2021-08-30 ENCOUNTER — Ambulatory Visit (HOSPITAL_BASED_OUTPATIENT_CLINIC_OR_DEPARTMENT_OTHER)
Admission: RE | Admit: 2021-08-30 | Discharge: 2021-08-30 | Disposition: A | Discharge status: Home / Self Care | Payer: BC Managed Care – PPO | Source: Ambulatory Visit | Attending: Oncology | Admitting: Oncology

## 2021-08-30 ENCOUNTER — Encounter (HOSPITAL_BASED_OUTPATIENT_CLINIC_OR_DEPARTMENT_OTHER): Payer: Self-pay

## 2021-08-30 ENCOUNTER — Other Ambulatory Visit: Payer: Self-pay

## 2021-08-30 DIAGNOSIS — Z86718 Personal history of other venous thrombosis and embolism: Secondary | ICD-10-CM | POA: Insufficient documentation

## 2021-08-30 DIAGNOSIS — K7460 Unspecified cirrhosis of liver: Secondary | ICD-10-CM | POA: Insufficient documentation

## 2021-08-30 DIAGNOSIS — K769 Liver disease, unspecified: Secondary | ICD-10-CM | POA: Diagnosis not present

## 2021-08-30 DIAGNOSIS — K6389 Other specified diseases of intestine: Secondary | ICD-10-CM | POA: Diagnosis not present

## 2021-08-30 DIAGNOSIS — D500 Iron deficiency anemia secondary to blood loss (chronic): Secondary | ICD-10-CM | POA: Insufficient documentation

## 2021-08-30 DIAGNOSIS — J8410 Pulmonary fibrosis, unspecified: Secondary | ICD-10-CM | POA: Diagnosis not present

## 2021-08-30 DIAGNOSIS — C182 Malignant neoplasm of ascending colon: Secondary | ICD-10-CM

## 2021-08-30 DIAGNOSIS — C787 Secondary malignant neoplasm of liver and intrahepatic bile duct: Secondary | ICD-10-CM | POA: Insufficient documentation

## 2021-08-30 DIAGNOSIS — Z7901 Long term (current) use of anticoagulants: Secondary | ICD-10-CM | POA: Insufficient documentation

## 2021-08-30 DIAGNOSIS — C189 Malignant neoplasm of colon, unspecified: Secondary | ICD-10-CM | POA: Diagnosis not present

## 2021-08-30 DIAGNOSIS — K5989 Other specified functional intestinal disorders: Secondary | ICD-10-CM | POA: Diagnosis not present

## 2021-08-30 DIAGNOSIS — Z5112 Encounter for antineoplastic immunotherapy: Secondary | ICD-10-CM | POA: Insufficient documentation

## 2021-08-30 LAB — CMP (CANCER CENTER ONLY)
ALT: 37 U/L (ref 0–44)
AST: 39 U/L (ref 15–41)
Albumin: 3.5 g/dL (ref 3.5–5.0)
Alkaline Phosphatase: 229 U/L — ABNORMAL HIGH (ref 38–126)
Anion gap: 7 (ref 5–15)
BUN: 18 mg/dL (ref 8–23)
CO2: 26 mmol/L (ref 22–32)
Calcium: 8.9 mg/dL (ref 8.9–10.3)
Chloride: 107 mmol/L (ref 98–111)
Creatinine: 0.7 mg/dL (ref 0.44–1.00)
GFR, Estimated: >60 mL/min (ref >60)
Glucose, Bld: 80 mg/dL (ref 70–99)
Potassium: 4.2 mmol/L (ref 3.5–5.1)
Sodium: 140 mmol/L (ref 135–145)
Total Bilirubin: 0.4 mg/dL (ref 0.3–1.2)
Total Protein: 6.1 g/dL — ABNORMAL LOW (ref 6.5–8.1)

## 2021-08-30 LAB — CBC WITH DIFFERENTIAL (CANCER CENTER ONLY)
Abs Immature Granulocytes: 0.05 10*3/uL (ref 0.00–0.07)
Basophils Absolute: 0.1 10*3/uL (ref 0.0–0.1)
Basophils Relative: 1 %
Eosinophils Absolute: 0.1 10*3/uL (ref 0.0–0.5)
Eosinophils Relative: 2 %
HCT: 33 % — ABNORMAL LOW (ref 36.0–46.0)
Hemoglobin: 10.4 g/dL — ABNORMAL LOW (ref 12.0–15.0)
Immature Granulocytes: 1 %
Lymphocytes Relative: 12 %
Lymphs Abs: 1 10*3/uL (ref 0.7–4.0)
MCH: 31.5 pg (ref 26.0–34.0)
MCHC: 31.5 g/dL (ref 30.0–36.0)
MCV: 100 fL (ref 80.0–100.0)
Monocytes Absolute: 0.7 10*3/uL (ref 0.1–1.0)
Monocytes Relative: 8 %
Neutro Abs: 6.4 10*3/uL (ref 1.7–7.7)
Neutrophils Relative %: 76 %
Platelet Count: 109 10*3/uL — ABNORMAL LOW (ref 150–400)
RBC: 3.3 MIL/uL — ABNORMAL LOW (ref 3.87–5.11)
RDW: 15.9 % — ABNORMAL HIGH (ref 11.5–15.5)
WBC Count: 8.3 10*3/uL (ref 4.0–10.5)
nRBC: 0 % (ref 0.0–0.2)

## 2021-08-30 LAB — CEA (ACCESS): CEA (CHCC): 66.98 ng/mL — ABNORMAL HIGH (ref 0.00–5.00)

## 2021-08-30 MED ORDER — IOHEXOL 300 MG/ML  SOLN
80.0000 mL | Freq: Once | INTRAMUSCULAR | Status: AC | PRN
  Administered 2021-08-30: 80 mL via INTRAVENOUS

## 2021-08-30 MED ORDER — HEPARIN SOD (PORK) LOCK FLUSH 100 UNIT/ML IV SOLN
500.0000 [IU] | Freq: Once | INTRAVENOUS | Status: AC
  Administered 2021-08-30: 500 [IU] via INTRAVENOUS

## 2021-08-31 ENCOUNTER — Inpatient Hospital Stay: Payer: BC Managed Care – PPO

## 2021-08-31 ENCOUNTER — Other Ambulatory Visit (HOSPITAL_COMMUNITY): Payer: Self-pay

## 2021-08-31 ENCOUNTER — Other Ambulatory Visit: Payer: BC Managed Care – PPO

## 2021-08-31 ENCOUNTER — Inpatient Hospital Stay: Payer: BC Managed Care – PPO | Admitting: Oncology

## 2021-08-31 ENCOUNTER — Telehealth: Payer: Self-pay | Admitting: Pharmacy Technician

## 2021-08-31 ENCOUNTER — Encounter: Payer: Self-pay | Admitting: *Deleted

## 2021-08-31 ENCOUNTER — Encounter: Payer: Self-pay | Admitting: Oncology

## 2021-08-31 VITALS — BP 124/59 | HR 80 | Temp 98.1°F | Resp 19 | Ht 67.0 in | Wt 126.4 lb

## 2021-08-31 VITALS — BP 153/61 | HR 60 | Temp 98.0°F | Resp 18

## 2021-08-31 DIAGNOSIS — D500 Iron deficiency anemia secondary to blood loss (chronic): Secondary | ICD-10-CM | POA: Diagnosis not present

## 2021-08-31 DIAGNOSIS — C182 Malignant neoplasm of ascending colon: Secondary | ICD-10-CM

## 2021-08-31 DIAGNOSIS — Z86718 Personal history of other venous thrombosis and embolism: Secondary | ICD-10-CM | POA: Diagnosis not present

## 2021-08-31 DIAGNOSIS — Z7901 Long term (current) use of anticoagulants: Secondary | ICD-10-CM | POA: Diagnosis not present

## 2021-08-31 DIAGNOSIS — Z5112 Encounter for antineoplastic immunotherapy: Secondary | ICD-10-CM | POA: Diagnosis not present

## 2021-08-31 DIAGNOSIS — K7460 Unspecified cirrhosis of liver: Secondary | ICD-10-CM | POA: Diagnosis not present

## 2021-08-31 DIAGNOSIS — C787 Secondary malignant neoplasm of liver and intrahepatic bile duct: Secondary | ICD-10-CM | POA: Diagnosis not present

## 2021-08-31 MED ORDER — SODIUM CHLORIDE 0.9 % IV SOLN
7.5000 mg/kg | Freq: Once | INTRAVENOUS | Status: AC
  Administered 2021-08-31: 400 mg via INTRAVENOUS
  Filled 2021-08-31: qty 16

## 2021-08-31 MED ORDER — CAPECITABINE 500 MG PO TABS
ORAL_TABLET | ORAL | 0 refills | Status: DC
  Filled 2021-08-31: qty 150, fill #0

## 2021-08-31 MED ORDER — SODIUM CHLORIDE 0.9 % IV SOLN: Freq: Once | INTRAVENOUS | Status: AC

## 2021-08-31 MED ORDER — SODIUM CHLORIDE 0.9% FLUSH
10.0000 mL | INTRAVENOUS | Status: DC | PRN
  Administered 2021-08-31: 10 mL

## 2021-08-31 MED ORDER — HEPARIN SOD (PORK) LOCK FLUSH 100 UNIT/ML IV SOLN
500.0000 [IU] | Freq: Once | INTRAVENOUS | Status: AC | PRN
  Administered 2021-08-31: 500 [IU]

## 2021-08-31 NOTE — Telephone Encounter (Signed)
Oral Oncology Patient Advocate Encounter   Received notification from [employer] that prior authorization for Xeloda is required.   PA submitted on CoverMyMeds Key [id number] Status is pending   Oral Oncology Clinic will continue to follow.  [place] Patient [place] Phone [phone] Fax [phone] 09/01/2021 3:07 PM

## 2021-08-31 NOTE — Patient Instructions (Signed)
[place]   Discharge Instructions: Thank you for choosing [facility] to provide your oncology and hematology care.   If you have a lab appointment with the [facility], please go directly to the [facility] and check in at the registration area.   Wear comfortable clothing and clothing appropriate for easy access to any Portacath or PICC line.   We strive to give you quality time with your provider. You may need to reschedule your appointment if you arrive late (15 or more minutes).  Arriving late affects you and other patients whose appointments are after yours.  Also, if you miss three or more appointments without notifying the office, you may be dismissed from the clinic at the providers discretion.      For prescription refill requests, have your pharmacy contact our office and allow 72 hours for refills to be completed.    Today you received the following chemotherapy and/or immunotherapy agents Bevacizumab-bvzr (ZIRABEV).      To help prevent nausea and vomiting after your treatment, we encourage you to take your nausea medication as directed.  BELOW ARE SYMPTOMS THAT SHOULD BE REPORTED IMMEDIATELY: *FEVER GREATER THAN 100.4 F (38 C) OR HIGHER *CHILLS OR SWEATING *NAUSEA AND VOMITING THAT IS NOT CONTROLLED WITH YOUR NAUSEA MEDICATION *UNUSUAL SHORTNESS OF BREATH *UNUSUAL BRUISING OR BLEEDING *URINARY PROBLEMS (pain or burning when urinating, or frequent urination) *BOWEL PROBLEMS (unusual diarrhea, constipation, pain near the anus) TENDERNESS IN MOUTH AND THROAT WITH OR WITHOUT PRESENCE OF ULCERS (sore throat, sores in mouth, or a toothache) UNUSUAL RASH, SWELLING OR PAIN  UNUSUAL VAGINAL DISCHARGE OR ITCHING   Items with * indicate a potential emergency and should be followed up as soon as possible or go to the Emergency Department if any problems should occur.  Please show the CHEMOTHERAPY ALERT CARD or IMMUNOTHERAPY ALERT CARD  at check-in to the Emergency Department and triage nurse.  Should you have questions after your visit or need to cancel or reschedule your appointment, please contact [facility]  Dept: [phone]  and follow the prompts.  Office hours are 8:00 a.m. to 4:30 p.m. Monday - Friday. Please note that voicemails left after 4:00 p.m. may not be returned until the following business day.  We are closed weekends and major holidays. You have access to a nurse at all times for urgent questions. Please call the main number to the clinic Dept: [phone] and follow the prompts.   For any non-urgent questions, you may also contact your provider using MyChart. We now offer e-Visits for anyone 30 and older to request care online for non-urgent symptoms. For details visit [email or web address].   Also download the MyChart app! Go to the app store, search "MyChart", open the app, select [facility], and log in with your MyChart username and password.  Due to Covid, a mask is required upon entering the hospital/clinic. If you do not have a mask, one will be given to you upon arrival. For doctor visits, patients may have 1 support person aged 51 or older with them. For treatment visits, patients cannot have anyone with them due to current Covid guidelines and our immunocompromised population.   Bevacizumab injection What is this medication? BEVACIZUMAB (be va SIZ yoo mab) is a monoclonal antibody. It is used to treat many types of cancer. This medicine may be used for other purposes; ask your health care provider or pharmacist if you have questions. COMMON BRAND NAME(S):  Alymsys, Avastin, MVASI, [name] What should I tell my care team before I take this medication? They need to know if you have any of these conditions: diabetes heart disease high blood pressure history of coughing up blood prior anthracycline chemotherapy (e.g., doxorubicin, daunorubicin, epirubicin) recent or  ongoing radiation therapy recent or planning to have surgery stroke an unusual or allergic reaction to bevacizumab, hamster proteins, mouse proteins, other medicines, foods, dyes, or preservatives pregnant or trying to get pregnant breast-feeding How should I use this medication? This medicine is for infusion into a vein. It is given by a health care professional in a hospital or clinic setting. Talk to your pediatrician regarding the use of this medicine in children. Special care may be needed. Overdosage: If you think you have taken too much of this medicine contact a poison control center or emergency room at once. NOTE: This medicine is only for you. Do not share this medicine with others. What if I miss a dose? It is important not to miss your dose. Call your doctor or health care professional if you are unable to keep an appointment. What may interact with this medication? Interactions are not expected. This list may not describe all possible interactions. Give your health care provider a list of all the medicines, herbs, non-prescription drugs, or dietary supplements you use. Also tell them if you smoke, drink alcohol, or use illegal drugs. Some items may interact with your medicine. What should I watch for while using this medication? Your condition will be monitored carefully while you are receiving this medicine. You will need important blood work and urine testing done while you are taking this medicine. This medicine may increase your risk to bruise or bleed. Call your doctor or health care professional if you notice any unusual bleeding. Before having surgery, talk to your health care provider to make sure it is ok. This drug can increase the risk of poor healing of your surgical site or wound. You will need to stop this drug for 28 days before surgery. After surgery, wait at least 28 days before restarting this drug. Make sure the surgical site or wound is healed enough before  restarting this drug. Talk to your health care provider if questions. Do not become pregnant while taking this medicine or for 6 months after stopping it. Women should inform their doctor if they wish to become pregnant or think they might be pregnant. There is a potential for serious side effects to an unborn child. Talk to your health care professional or pharmacist for more information. Do not breast-feed an infant while taking this medicine and for 6 months after the last dose. This medicine has caused ovarian failure in some women. This medicine may interfere with the ability to have a child. You should talk to your doctor or health care professional if you are concerned about your fertility. What side effects may I notice from receiving this medication? Side effects that you should report to your doctor or health care professional as soon as possible: allergic reactions like skin rash, itching or hives, swelling of the face, lips, or tongue chest pain or chest tightness chills coughing up blood high fever seizures severe constipation signs and symptoms of bleeding such as bloody or black, tarry stools; red or dark-Persky urine; spitting up blood or Bergh material that looks like coffee grounds; red spots on the skin; unusual bruising or bleeding from the eye, gums, or nose signs and symptoms of a blood clot such as  breathing problems; chest pain; severe, sudden headache; pain, swelling, warmth in the leg signs and symptoms of a stroke like changes in vision; confusion; trouble speaking or understanding; severe headaches; sudden numbness or weakness of the face, arm or leg; trouble walking; dizziness; loss of balance or coordination stomach pain sweating swelling of legs or ankles vomiting weight gain Side effects that usually do not require medical attention (report to your doctor or health care professional if they continue or are bothersome): back pain changes in taste decreased  appetite dry skin nausea tiredness This list may not describe all possible side effects. Call your doctor for medical advice about side effects. You may report side effects to FDA at 1-800-FDA-1088. Where should I keep my medication? This drug is given in a hospital or clinic and will not be stored at home. NOTE: This sheet is a summary. It may not cover all possible information. If you have questions about this medicine, talk to your doctor, pharmacist, or health care provider.  2022 Elsevier/Gold Standard (2021-04-19 00:00:00)

## 2021-08-31 NOTE — Progress Notes (Signed)
[facility] OFFICE PROGRESS NOTE   Diagnosis: Colon cancer  INTERVAL HISTORY:   Stacy Burns completed another cycle of FOLFIRI/Avastin on 08/10/2021.  No nausea or mouth sores.  No significant diarrhea.  Objective:  Vital signs in last 24 hours:  Blood pressure (!) 124/59, pulse 80, temperature 98.1 F (36.7 C), temperature source Oral, resp. rate 19, height _0  (1.702 m), weight 126 lb 6.4 oz (57.3 kg), SpO2 100 %.    HEENT: No thrush or ulcers Resp: Lungs clear bilaterally Cardio: Regular rate and rhythm GI: Nontender, no hepatosplenomegaly, no mass Vascular: No leg edema  Skin: Ecchymoses at the distal forearms and hands bilaterally  Portacath/PICC-without erythema  Lab Results:  Lab Results  Component Value Date   WBC 8.3 08/30/2021   HGB 10.4 (L) 08/30/2021   HCT 33.0 (L) 08/30/2021   MCV 100.0 08/30/2021   PLT 109 (L) 08/30/2021   NEUTROABS 6.4 08/30/2021    CMP  Lab Results  Component Value Date   NA 140 08/30/2021   K 4.2 08/30/2021   CL 107 08/30/2021   CO2 26 08/30/2021   GLUCOSE 80 08/30/2021   BUN 18 08/30/2021   CREATININE 0.70 08/30/2021   CALCIUM 8.9 08/30/2021   PROT 6.1 (L) 08/30/2021   ALBUMIN 3.5 08/30/2021   AST 39 08/30/2021   ALT 37 08/30/2021   ALKPHOS 229 (H) 08/30/2021   BILITOT 0.4 08/30/2021   GFRNONAA >60 08/30/2021   GFRAA >60 05/06/2020    Lab Results  Component Value Date   CEA1 54.57 (H) 12/30/2020   CEA 66.98 (H) 08/30/2021      Imaging:  CT CHEST ABDOMEN PELVIS W CONTRAST  Result Date: 08/30/2021 CLINICAL DATA:  Colorectal carcinoma.  Assess response to treatment. EXAM: CT CHEST, ABDOMEN, AND PELVIS WITH CONTRAST TECHNIQUE: Multidetector CT imaging of the chest, abdomen and pelvis was performed following the standard protocol during bolus administration of intravenous contrast. RADIATION DOSE REDUCTION: This exam was performed according to the departmental dose-optimization program which includes  automated exposure control, adjustment of the mA and/or kV according to patient size and/or use of iterative reconstruction technique. CONTRAST:  65m OMNIPAQUE IOHEXOL 300 MG/ML  SOLN COMPARISON:  05/04/2021 FINDINGS: CT CHEST FINDINGS Cardiovascular: Port in the anterior chest wall with tip in distal SVC. No significant vascular findings. Normal heart size. No pericardial effusion. Mediastinum/Nodes: No axillary or supraclavicular adenopathy. No mediastinal or hilar adenopathy. No pericardial fluid. Esophagus normal. Lungs/Pleura: No suspicious pulmonary nodules. Calcified granuloma in the LEFT lower lobe. Biapical pleuroparenchymal thickening appears benign. Musculoskeletal: No aggressive osseous lesion. CT ABDOMEN AND PELVIS FINDINGS Hepatobiliary: Multiple round hypoenhancing lesions in the LEFT RIGHT hepatic lobe are not changed in number or size from comparison exam. Example lesion in the superior central LEFT hepatic lobe measures 14 mm (image 54/2) compared to 14 mm. Small 4 mm lesion in the LEFT hepatic lobe (image 57/2) is unchanged. One lesion adjacent to the gallbladder fossa is increased measures 14 mm (image 72/2) compared to 10 mm. Pancreas: Pancreas is normal. No ductal dilatation. No pancreatic inflammation. Spleen: Spleen is enlarged measuring 13.7 cm in craniocaudad dimension which is unchanged. Adrenals/urinary tract: Adrenal glands and kidneys are normal. The ureters and bladder normal. Stomach/Bowel: Stomach, duodenum and small bowel normal. There is persistent pericolonic stranding and peritoneal thickening along the cecum and ascending colon which is slightly improved (image 91/series 2). No bowel obstruction. Moderate volume stool in the cecum. Vascular/Lymphatic: Abdominal aorta is normal caliber. There is no  retroperitoneal or periportal lymphadenopathy. No pelvic lymphadenopathy. Reproductive: Uterus and adnexa unremarkable. Other: No peritoneal metastasis Musculoskeletal: No aggressive  osseous lesion. IMPRESSION: Chest Impression: No evidence of thoracic metastasis Abdomen / Pelvis Impression: 1. Majority of the hepatic lesions unchanged in size or number. One lesion adjacent the gallbladder fossa is increased in size. 2. Persistent pericolonic stranding and peritoneal thickening along the ascending colon is slightly improved from comparison exam. 3. No evidence of adenopathy in the abdomen pelvis. 4. Stable splenomegaly Electronically Signed   By: Suzy Bouchard M.D.   On: 08/30/2021 12:53    Medications: I have reviewed the patient's current medications.   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  Cirrhosis.  Innumerable liver lesions. Colonoscopy 12/18/2019-ulcerated partially obstructing large mass in the mid ascending colon.  Biopsy invasive adenocarcinoma; preserved expression major MMR proteins Upper endoscopy 12/18/2019-normal esophagus, stomach, examined duodenum CEA 12/19/2019-8,923 Biopsy liver lesion 12/23/2019-adenocarcinoma Foundation 1-K-ras wild-type; tumor mutation burden greater than or equal to 10; microsatellite stable; BRAF V600E Cycle 1 FOLFOX 01/01/2020 Cycle 2 FOLFOX 01/15/2020  Cycle 3 FOLFOX 01/29/2020, Udenyca added Cycle 4 FOLFOX 02/12/2020, Udenyca held Cycle 5 FOLFOX 02/26/2020, Udenyca Cycle 6 FOLFOX 03/11/2020, Udenyca held CT abdomen/pelvis 03/16/2020-stable diffuse liver lesions, stable strictured appearing ascending colon, possible pericolonic implant, vague left lower lobe nodule Cycle 1 FOLFIRI/Avastin 04/06/2020 Cycle 2 FOLFIRI/Avastin 04/21/2020 Cycle 3 FOLFIRI/Avastin 05/06/2020 Cycle 4 FOLFIRI/Avastin 05/20/2020 Cycle 5 FOLFIRI/Avastin 06/03/2020 CTs 06/15/2020-mild to moderate improvement in hepatic metastasis.  No new or progressive disease.  Narrowing within the proximal ascending colon with upstream mild cecal and terminal ileum  dilatation, similar. Cycle 6 FOLFIRI/Avastin 06/17/2020 Cycle 7 FOLFIRI/Avastin 07/01/2020 Cycle 8 FOLFIRI/Avastin 07/15/2020 Cycle 9 FOLFIRI/Avastin 07/29/2020-5-FU bolus eliminated, 5-FU infusion and irinotecan dose reduced Cycle 10 FOLFIRI/Avastin 08/12/2020 CTs 08/23/2020- mild residual wall thickening involving the cecum/ascending colon.  Numerous liver metastases improved. Cycle 11 FOLFIRI/Avastin 08/25/2020 Cycle 12 FOLFIRI/Avastin 09/16/2020 Cycle 13 FOLFIRI/Avastin 10/07/2020 Cycle 14 FOLFIRI/Avastin 10/28/2020 Cycle 15 FOLFIRI/Avastin 11/18/2020 CT abdomen/pelvis 12/06/2020-mild improvement in multifocal hepatic metastases, wall thickening at the ascending colon with pericolonic inflammatory changes Cycle 16 FOLFIRI/Avastin 12/09/2020 Cycle 17 FOLFIRI/Avastin 12/30/2020 Cycle 18 FOLFIRI/Avastin 01/20/2021 Cycle 19 FOLFIRI/Avastin 02/10/2021 Cycle 20 FOLFIRI/Avastin 03/03/2021 Cycle 21 FOLFIRI/Avastin 03/24/2021 Cycle 22 FOLFIRI/Avastin 04/14/2021 CTs 05/04/2021-slight improvement in hepatic metastatic disease. Cycle 23 FOLFIRI/Avastin 05/05/2021 Cycle 24 FOLFIRI/Avastin 05/25/2021 Cycle 25 FOLFIRI/Avastin 06/15/2021 Cycle 26 FOLFIRI/Avastin 07/13/2021 Cycle 27 FOLFIRI/Avastin 08/02/2021 CTs 08/30/2021-majority of liver lesions are unchanged in size, 1 lesion adjacent to the gallbladder has increased, no adenopathy, persistent pericolonic stranding and peritoneal thickening at the cecum Progressive rise in CEA 08/31/2021-treatment changed to Xeloda/bevacizumab Anemia secondary to GI blood loss, on oral iron BID  Cirrhosis 12/26/2019-hospital admission for right lower extremity DVT and GI bleed, treated with heparin anticoagulation beginning 12/26/2019, converted to Lovenox at discharge 12/29/2019 Lovenox reduced to 40 mg daily 02/10/2021 secondary to bruising and nosebleeding History of low-grade fever-likely "tumor" fever COVID-19 infection January 2021     Disposition: Stacy Burns appears  unchanged.  The CEA has increased over the past few months.  The restaging CTs revealed no clear evidence of disease progression.  Stacy Burns has been maintained on FOLFIRI/Avastin since August 2021.  The restaging CT is consistent with stable versus slight progression of disease.  I discussed treatment options with Stacy Burns.  We discussed continuing FOLFIRI/Avastin, a treatment break, and switching to a different systemic therapy regimen.  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  There appears to be minimal evidence of disease progression with a rising CEA.  I suspect she has reached maximum benefit from the FOLFIRI.  We decided to switch to a third line/maintenance regimen with capecitabine/bevacizumab.  We reviewed potential toxicities associated with capecitabine including the chance of mucositis, diarrhea, and hand/foot syndrome.  She agrees to proceed.  She will continue every 3-week bevacizumab.  The plan is to change to panitumumab/encorafenib when there is future disease progression.  A treatment plan was entered today.  Stacy Burns will return for an office visit and Avastin in 3 weeks.  [name], MD  08/31/2021  9:49 AM

## 2021-08-31 NOTE — Progress Notes (Signed)
Patient seen by Dr. Benay Spice today  Vitals are within treatment parameters.  Labs reviewed by Dr. Benay Spice and are within treatment parameters.  Per physician team, patient is ready for treatment. Please note that modifications are being made to the treatment plan including Changing treatment to every 3 week Avastin and Xeloda. Changed Avastin today to every 3 week dose.

## 2021-08-31 NOTE — Progress Notes (Signed)
Patient presents for treatment. RN assessment completed along with the following:  Labs/vitals reviewed - Yes, and within treatment parameters.   Weight within 10% of previous measurement - Yes Informed consent completed and reflects current therapy/intent - Yes, on date 04/21/2020             Provider progress note reviewed - Today's provider note is not yet available. I reviewed the most recent oncology provider progress note in chart dated 08/10/2021. Treatment/Antibody/Supportive plan reviewed - Yes, and Just Avastin today S&H and other orders reviewed - Yes, and there are no additional orders identified. Previous treatment date reviewed - Yes, and the appropriate amount of time has elapsed between treatments. Clinic Hand Off Received from - Yes from [facility], RN  Patient to proceed with treatment.

## 2021-08-31 NOTE — Progress Notes (Signed)
Provided patient printed information on capecitabine and process to obtain medication. Pharmacist will call her for education.

## 2021-09-01 ENCOUNTER — Telehealth: Payer: Self-pay | Admitting: Pharmacist

## 2021-09-01 ENCOUNTER — Other Ambulatory Visit (HOSPITAL_COMMUNITY): Payer: Self-pay

## 2021-09-01 ENCOUNTER — Other Ambulatory Visit: Payer: Self-pay | Admitting: Pharmacist

## 2021-09-01 DIAGNOSIS — C182 Malignant neoplasm of ascending colon: Secondary | ICD-10-CM

## 2021-09-01 MED ORDER — CAPECITABINE 500 MG PO TABS: ORAL_TABLET | ORAL | 0 refills | Status: DC

## 2021-09-01 NOTE — Telephone Encounter (Signed)
Oral Oncology Patient Advocate Encounter  Prior Authorization for Xeloda has been approved.    PA# [id number] Effective dates: 08/02/21 through 09/01/22  Patient must use [facility].  Oral Oncology Clinic will continue to follow.  [place] Patient [place] Phone [phone] Fax [phone] 09/01/2021 3:11 PM

## 2021-09-01 NOTE — Telephone Encounter (Signed)
Oral Chemotherapy Pharmacist Encounter  Patient notified that her capecitabine has to be filled at [facility]. Patient provided with the phone number to [facility].   Patient Education I spoke with patient for overview of new oral chemotherapy medication: Xeloda (capecitabine) for the treatment of metastatic colon cancer in conjunction with bevacizumab, planned duration until disease progression or unacceptable toxicity.   Pt is doing well. Counseled patient on administration, dosing, side effects, monitoring, drug-food interactions, safe handling, storage, and disposal. Patient will take 3 tablets (1500mg ) by mouth in AM and 2 tablets (1000mg ) in PM. Take with food.  Side effects include but not limited to: diarrhea, hand-foot syndrome, mouth sores, edema, decreased wbc, fatigue, N/V Diarrhea: patient has loperamide at home and will use it as needed. She will call if she has 4 or more loose stools per day Hand-foot syndrome: Suggested patient use U[place]derly Smooth Extra Care 20 Mouth sores: patient will call for magic mouthwash if needed  Reviewed with patient importance of keeping a medication schedule and plan for any missed doses.  After discussion with patient no patient barriers to medication adherence identified.   Ms. August voiced understanding and appreciation. All questions answered. Medication handout provided.  Provided patient with [facility] phone number. Patient knows to call the office with questions or concerns. Oral Chemotherapy Navigation Clinic will continue to follow.  Darl Pikes, PharmD, BCPS, BCOP, CPP Hematology/Oncology Clinical Pharmacist Practitioner [place]/DB/AP Oral [facility] [phone]  09/01/2021 3:41 PM

## 2021-09-01 NOTE — Telephone Encounter (Unsigned)
Oral Oncology Pharmacy Student Encounter  Received new prescription for capecitabine (Xeloda) for the treatment of Colon Cancer-metastatic in conjunction with bevacizumab, planned duration until disease progression or unacceptable toxicity.  Renal fxn from 1/17 assessed, abnormal CEA level. Prescription dose and frequency assessed.   Current medication list in Epic reviewed, no DDI with Xeloda identified:  Evaluated chart and no patient barriers to medication adherence identified.   Prescription has been e-scribed to the [facility] for benefits analysis and approval.  Oral Oncology Clinic will continue to follow for insurance authorization, copayment issues, initial counseling and start date.  Patient agreed to treatment on 1/18 per MD documentation.  Avis Epley, PharmD candidate [address] [place]/DB/AP Oral [facility] [phone]  09/01/2021 10:47 AM

## 2021-09-02 ENCOUNTER — Other Ambulatory Visit (HOSPITAL_COMMUNITY): Payer: Self-pay

## 2021-09-02 ENCOUNTER — Inpatient Hospital Stay: Payer: BC Managed Care – PPO

## 2021-09-02 NOTE — Telephone Encounter (Signed)
Rx redirected to [facility], patient notified.

## 2021-09-07 NOTE — Telephone Encounter (Signed)
Per [facility] prescriber portal, delivery of Xeloda is scheduled for 09/09/21.  [place] Patient [place] Phone [phone] Fax [phone] 09/07/2021 4:22 PM

## 2021-09-17 ENCOUNTER — Other Ambulatory Visit: Payer: Self-pay | Admitting: Oncology

## 2021-09-22 ENCOUNTER — Inpatient Hospital Stay: Payer: BC Managed Care – PPO

## 2021-09-22 ENCOUNTER — Other Ambulatory Visit: Payer: Self-pay

## 2021-09-22 ENCOUNTER — Encounter: Payer: Self-pay | Admitting: *Deleted

## 2021-09-22 ENCOUNTER — Encounter: Payer: Self-pay | Admitting: Nurse Practitioner

## 2021-09-22 ENCOUNTER — Telehealth: Payer: Self-pay

## 2021-09-22 ENCOUNTER — Inpatient Hospital Stay: Payer: BC Managed Care – PPO | Attending: Oncology | Admitting: Nurse Practitioner

## 2021-09-22 VITALS — BP 132/56 | HR 60 | Temp 97.6°F | Resp 16 | Wt 131.2 lb

## 2021-09-22 VITALS — BP 152/52 | HR 57 | Temp 98.0°F | Resp 18

## 2021-09-22 DIAGNOSIS — K7460 Unspecified cirrhosis of liver: Secondary | ICD-10-CM | POA: Insufficient documentation

## 2021-09-22 DIAGNOSIS — Z86718 Personal history of other venous thrombosis and embolism: Secondary | ICD-10-CM | POA: Diagnosis not present

## 2021-09-22 DIAGNOSIS — D649 Anemia, unspecified: Secondary | ICD-10-CM

## 2021-09-22 DIAGNOSIS — Z5112 Encounter for antineoplastic immunotherapy: Secondary | ICD-10-CM | POA: Insufficient documentation

## 2021-09-22 DIAGNOSIS — Z7901 Long term (current) use of anticoagulants: Secondary | ICD-10-CM | POA: Insufficient documentation

## 2021-09-22 DIAGNOSIS — C787 Secondary malignant neoplasm of liver and intrahepatic bile duct: Secondary | ICD-10-CM | POA: Insufficient documentation

## 2021-09-22 DIAGNOSIS — D500 Iron deficiency anemia secondary to blood loss (chronic): Secondary | ICD-10-CM | POA: Diagnosis not present

## 2021-09-22 DIAGNOSIS — C182 Malignant neoplasm of ascending colon: Secondary | ICD-10-CM | POA: Diagnosis not present

## 2021-09-22 LAB — CBC WITH DIFFERENTIAL (CANCER CENTER ONLY)
Abs Immature Granulocytes: 0.01 10*3/uL (ref 0.00–0.07)
Basophils Absolute: 0 10*3/uL (ref 0.0–0.1)
Basophils Relative: 1 %
Eosinophils Absolute: 0.2 10*3/uL (ref 0.0–0.5)
Eosinophils Relative: 4 %
HCT: 29.7 % — ABNORMAL LOW (ref 36.0–46.0)
Hemoglobin: 9.6 g/dL — ABNORMAL LOW (ref 12.0–15.0)
Immature Granulocytes: 0 %
Lymphocytes Relative: 25 %
Lymphs Abs: 0.9 10*3/uL (ref 0.7–4.0)
MCH: 32.4 pg (ref 26.0–34.0)
MCHC: 32.3 g/dL (ref 30.0–36.0)
MCV: 100.3 fL — ABNORMAL HIGH (ref 80.0–100.0)
Monocytes Absolute: 0.4 10*3/uL (ref 0.1–1.0)
Monocytes Relative: 11 %
Neutro Abs: 2.2 10*3/uL (ref 1.7–7.7)
Neutrophils Relative %: 59 %
Platelet Count: 105 10*3/uL — ABNORMAL LOW (ref 150–400)
RBC: 2.96 MIL/uL — ABNORMAL LOW (ref 3.87–5.11)
RDW: 16.4 % — ABNORMAL HIGH (ref 11.5–15.5)
WBC Count: 3.6 10*3/uL — ABNORMAL LOW (ref 4.0–10.5)
nRBC: 0 % (ref 0.0–0.2)

## 2021-09-22 LAB — CMP (CANCER CENTER ONLY)
ALT: 22 U/L (ref 0–44)
AST: 26 U/L (ref 15–41)
Albumin: 3.3 g/dL — ABNORMAL LOW (ref 3.5–5.0)
Alkaline Phosphatase: 145 U/L — ABNORMAL HIGH (ref 38–126)
Anion gap: 6 (ref 5–15)
BUN: 25 mg/dL — ABNORMAL HIGH (ref 8–23)
CO2: 25 mmol/L (ref 22–32)
Calcium: 8.6 mg/dL — ABNORMAL LOW (ref 8.9–10.3)
Chloride: 112 mmol/L — ABNORMAL HIGH (ref 98–111)
Creatinine: 0.59 mg/dL (ref 0.44–1.00)
GFR, Estimated: >60 mL/min (ref >60)
Glucose, Bld: 85 mg/dL (ref 70–99)
Potassium: 4.1 mmol/L (ref 3.5–5.1)
Sodium: 143 mmol/L (ref 135–145)
Total Bilirubin: 0.4 mg/dL (ref 0.3–1.2)
Total Protein: 5.8 g/dL — ABNORMAL LOW (ref 6.5–8.1)

## 2021-09-22 LAB — URINALYSIS, COMPLETE (UACMP) WITH MICROSCOPIC
Bilirubin Urine: NEGATIVE
Glucose, UA: NEGATIVE mg/dL
Hgb urine dipstick: NEGATIVE
Ketones, ur: NEGATIVE mg/dL
Nitrite: NEGATIVE
Protein, ur: 30 mg/dL — AB
Specific Gravity, Urine: 1.026 (ref 1.005–1.030)
pH: 5.5 (ref 5.0–8.0)

## 2021-09-22 LAB — TOTAL PROTEIN, URINE DIPSTICK: Protein, ur: 100 mg/dL — AB

## 2021-09-22 LAB — CEA (ACCESS): CEA (CHCC): 85.73 ng/mL — ABNORMAL HIGH (ref 0.00–5.00)

## 2021-09-22 MED ORDER — SODIUM CHLORIDE 0.9% FLUSH
10.0000 mL | INTRAVENOUS | Status: DC | PRN
  Administered 2021-09-22: 10 mL

## 2021-09-22 MED ORDER — HEPARIN SOD (PORK) LOCK FLUSH 100 UNIT/ML IV SOLN
500.0000 [IU] | Freq: Once | INTRAVENOUS | Status: AC | PRN
  Administered 2021-09-22: 500 [IU]

## 2021-09-22 MED ORDER — SODIUM CHLORIDE 0.9 % IV SOLN: Freq: Once | INTRAVENOUS | Status: AC

## 2021-09-22 MED ORDER — OXYCODONE-ACETAMINOPHEN 5-325 MG PO TABS: 1.0000 | ORAL_TABLET | Freq: Three times a day (TID) | ORAL | 0 refills | Status: DC | PRN

## 2021-09-22 MED ORDER — SODIUM CHLORIDE 0.9 % IV SOLN
7.5000 mg/kg | Freq: Once | INTRAVENOUS | Status: AC
  Administered 2021-09-22: 400 mg via INTRAVENOUS
  Filled 2021-09-22: qty 16

## 2021-09-22 NOTE — Progress Notes (Signed)
[facility] OFFICE PROGRESS NOTE   Diagnosis: Colon cancer  INTERVAL HISTORY:   Ms. Calvi returns as scheduled.  Treatment changed to Xeloda/bevacizumab last office visit 08/31/2021.  She began the Xeloda on 09/10/2021.  She denies nausea/vomiting.  She has a sore on her tongue.  No diarrhea.  No change in erythema on the soles which predated the start of Xeloda.  No associated pain.  She has occasional abdominal pain, various locations.  She denies bleeding.  Objective:  Vital signs in last 24 hours:  Blood pressure (!) 132/56, pulse 60, temperature 97.6 F (36.4 C), temperature source Tympanic, resp. rate 16, weight 131 lb 3.2 oz (59.5 kg), SpO2 100 %.    HEENT: Single ulcer underneath the tongue left side. Resp: Lungs clear bilaterally. Cardio: Regular rate and rhythm. GI: Abdomen soft and nontender.  Palpable liver edge.  No associated tenderness. Vascular: Trace bilateral pretibial edema. Skin: Ecchymoses at the distal forearms and hands bilaterally. Port-A-Cath without erythema   Lab Results:  Lab Results  Component Value Date   WBC 3.6 (L) 09/22/2021   HGB 9.6 (L) 09/22/2021   HCT 29.7 (L) 09/22/2021   MCV 100.3 (H) 09/22/2021   PLT 105 (L) 09/22/2021   NEUTROABS 2.2 09/22/2021    Imaging:  No results found.  Medications: I have reviewed the patient's current medications.  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  Cirrhosis.  Innumerable liver lesions. Colonoscopy 12/18/2019-ulcerated partially obstructing large mass in the mid ascending colon.  Biopsy invasive adenocarcinoma; preserved expression major MMR proteins Upper endoscopy 12/18/2019-normal esophagus, stomach, examined duodenum CEA 12/19/2019-8,923 Biopsy liver lesion 12/23/2019-adenocarcinoma Foundation 1-K-ras wild-type; tumor mutation burden greater than or equal to 10;  microsatellite stable; BRAF V600E Cycle 1 FOLFOX 01/01/2020 Cycle 2 FOLFOX 01/15/2020  Cycle 3 FOLFOX 01/29/2020, Udenyca added Cycle 4 FOLFOX 02/12/2020, Udenyca held Cycle 5 FOLFOX 02/26/2020, Udenyca Cycle 6 FOLFOX 03/11/2020, Udenyca held CT abdomen/pelvis 03/16/2020-stable diffuse liver lesions, stable strictured appearing ascending colon, possible pericolonic implant, vague left lower lobe nodule Cycle 1 FOLFIRI/Avastin 04/06/2020 Cycle 2 FOLFIRI/Avastin 04/21/2020 Cycle 3 FOLFIRI/Avastin 05/06/2020 Cycle 4 FOLFIRI/Avastin 05/20/2020 Cycle 5 FOLFIRI/Avastin 06/03/2020 CTs 06/15/2020-mild to moderate improvement in hepatic metastasis.  No new or progressive disease.  Narrowing within the proximal ascending colon with upstream mild cecal and terminal ileum dilatation, similar. Cycle 6 FOLFIRI/Avastin 06/17/2020 Cycle 7 FOLFIRI/Avastin 07/01/2020 Cycle 8 FOLFIRI/Avastin 07/15/2020 Cycle 9 FOLFIRI/Avastin 07/29/2020-5-FU bolus eliminated, 5-FU infusion and irinotecan dose reduced Cycle 10 FOLFIRI/Avastin 08/12/2020 CTs 08/23/2020- mild residual wall thickening involving the cecum/ascending colon.  Numerous liver metastases improved. Cycle 11 FOLFIRI/Avastin 08/25/2020 Cycle 12 FOLFIRI/Avastin 09/16/2020 Cycle 13 FOLFIRI/Avastin 10/07/2020 Cycle 14 FOLFIRI/Avastin 10/28/2020 Cycle 15 FOLFIRI/Avastin 11/18/2020 CT abdomen/pelvis 12/06/2020-mild improvement in multifocal hepatic metastases, wall thickening at the ascending colon with pericolonic inflammatory changes Cycle 16 FOLFIRI/Avastin 12/09/2020 Cycle 17 FOLFIRI/Avastin 12/30/2020 Cycle 18 FOLFIRI/Avastin 01/20/2021 Cycle 19 FOLFIRI/Avastin 02/10/2021 Cycle 20 FOLFIRI/Avastin 03/03/2021 Cycle 21 FOLFIRI/Avastin 03/24/2021 Cycle 22 FOLFIRI/Avastin 04/14/2021 CTs 05/04/2021-slight improvement in hepatic metastatic disease. 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 Schnitzler appears stable.  She recently began treatment with Xeloda, continuation of bevacizumab.  Plan to continue the same.    She has a single mouth sore.  She will treat symptomatically.  She understands this may be related to Xeloda.  She will contact the office with increased ulcers.  CBC and chemistry panel reviewed.  Labs adequate to proceed with treatment.  Urine positive for protein.  Plan for repeat urine protein test in 3 weeks.  She will return for lab, follow-up, Avastin in 3 weeks.  She will contact the office in the interim as outlined above or with any other problems.    Ned Card ANP/GNP-BC   09/22/2021  8:49 AM

## 2021-09-22 NOTE — Progress Notes (Signed)
Patient seen by Ned Card NP today  Vitals are within treatment parameters.  Labs reviewed by Ned Card NP and are within treatment parameters. Re-check of urine protein =30 instead of 100  Per physician team, patient is ready for treatment and there are NO modifications to the treatment plan.

## 2021-09-22 NOTE — Addendum Note (Signed)
Addended by: Owens Shark on: 09/22/2021 09:31 AM   Modules accepted: Orders

## 2021-09-22 NOTE — Telephone Encounter (Signed)
Call in Magic Mouth wash to [facility]

## 2021-09-22 NOTE — Patient Instructions (Signed)
[facility]   Discharge Instructions: Thank you for choosing [facility] to provide your oncology and hematology care.   If you have a lab appointment with the [facility], please go directly to the [facility] and check in at the registration area.   Wear comfortable clothing and clothing appropriate for easy access to any Portacath or PICC line.   We strive to give you quality time with your provider. You may need to reschedule your appointment if you arrive late (15 or more minutes).  Arriving late affects you and other patients whose appointments are after yours.  Also, if you miss three or more appointments without notifying the office, you may be dismissed from the clinic at the providers discretion.      For prescription refill requests, have your pharmacy contact our office and allow 72 hours for refills to be completed.    Today you received the following chemotherapy and/or immunotherapy agents Bevacizumab-bvzr (ZIRABEV).      To help prevent nausea and vomiting after your treatment, we encourage you to take your nausea medication as directed.  BELOW ARE SYMPTOMS THAT SHOULD BE REPORTED IMMEDIATELY: *FEVER GREATER THAN 100.4 F (38 C) OR HIGHER *CHILLS OR SWEATING *NAUSEA AND VOMITING THAT IS NOT CONTROLLED WITH YOUR NAUSEA MEDICATION *UNUSUAL SHORTNESS OF BREATH *UNUSUAL BRUISING OR BLEEDING *URINARY PROBLEMS (pain or burning when urinating, or frequent urination) *BOWEL PROBLEMS (unusual diarrhea, constipation, pain near the anus) TENDERNESS IN MOUTH AND THROAT WITH OR WITHOUT PRESENCE OF ULCERS (sore throat, sores in mouth, or a toothache) UNUSUAL RASH, SWELLING OR PAIN  UNUSUAL VAGINAL DISCHARGE OR ITCHING   Items with * indicate a potential emergency and should be followed up as soon as possible or go to the Emergency Department if any problems should occur.  Please show the CHEMOTHERAPY ALERT CARD or IMMUNOTHERAPY ALERT CARD  at check-in to the Emergency Department and triage nurse.  Should you have questions after your visit or need to cancel or reschedule your appointment, please contact [facility]  Dept: [phone]  and follow the prompts.  Office hours are 8:00 a.m. to 4:30 p.m. Monday - Friday. Please note that voicemails left after 4:00 p.m. may not be returned until the following business day.  We are closed weekends and major holidays. You have access to a nurse at all times for urgent questions. Please call the main number to the clinic Dept: [phone] and follow the prompts.   For any non-urgent questions, you may also contact your provider using MyChart. We now offer e-Visits for anyone 30 and older to request care online for non-urgent symptoms. For details visit [email or web address].   Also download the MyChart app! Go to the app store, search "MyChart", open the app, select [facility], and log in with your MyChart username and password.  Due to Covid, a mask is required upon entering the hospital/clinic. If you do not have a mask, one will be given to you upon arrival. For doctor visits, patients may have 1 support person aged 51 or older with them. For treatment visits, patients cannot have anyone with them due to current Covid guidelines and our immunocompromised population.   Bevacizumab injection What is this medication? BEVACIZUMAB (be va SIZ yoo mab) is a monoclonal antibody. It is used to treat many types of cancer. This medicine may be used for other purposes; ask your health care provider or pharmacist if you have questions. COMMON BRAND NAME(S):  Alymsys, Avastin, MVASI, [name] What should I tell my care team before I take this medication? They need to know if you have any of these conditions: diabetes heart disease high blood pressure history of coughing up blood prior anthracycline chemotherapy (e.g., doxorubicin, daunorubicin, epirubicin) recent or  ongoing radiation therapy recent or planning to have surgery stroke an unusual or allergic reaction to bevacizumab, hamster proteins, mouse proteins, other medicines, foods, dyes, or preservatives pregnant or trying to get pregnant breast-feeding How should I use this medication? This medicine is for infusion into a vein. It is given by a health care professional in a hospital or clinic setting. Talk to your pediatrician regarding the use of this medicine in children. Special care may be needed. Overdosage: If you think you have taken too much of this medicine contact a poison control center or emergency room at once. NOTE: This medicine is only for you. Do not share this medicine with others. What if I miss a dose? It is important not to miss your dose. Call your doctor or health care professional if you are unable to keep an appointment. What may interact with this medication? Interactions are not expected. This list may not describe all possible interactions. Give your health care provider a list of all the medicines, herbs, non-prescription drugs, or dietary supplements you use. Also tell them if you smoke, drink alcohol, or use illegal drugs. Some items may interact with your medicine. What should I watch for while using this medication? Your condition will be monitored carefully while you are receiving this medicine. You will need important blood work and urine testing done while you are taking this medicine. This medicine may increase your risk to bruise or bleed. Call your doctor or health care professional if you notice any unusual bleeding. Before having surgery, talk to your health care provider to make sure it is ok. This drug can increase the risk of poor healing of your surgical site or wound. You will need to stop this drug for 28 days before surgery. After surgery, wait at least 28 days before restarting this drug. Make sure the surgical site or wound is healed enough before  restarting this drug. Talk to your health care provider if questions. Do not become pregnant while taking this medicine or for 6 months after stopping it. Women should inform their doctor if they wish to become pregnant or think they might be pregnant. There is a potential for serious side effects to an unborn child. Talk to your health care professional or pharmacist for more information. Do not breast-feed an infant while taking this medicine and for 6 months after the last dose. This medicine has caused ovarian failure in some women. This medicine may interfere with the ability to have a child. You should talk to your doctor or health care professional if you are concerned about your fertility. What side effects may I notice from receiving this medication? Side effects that you should report to your doctor or health care professional as soon as possible: allergic reactions like skin rash, itching or hives, swelling of the face, lips, or tongue chest pain or chest tightness chills coughing up blood high fever seizures severe constipation signs and symptoms of bleeding such as bloody or black, tarry stools; red or dark-Zukas urine; spitting up blood or [place] material that looks like coffee grounds; red spots on the skin; unusual bruising or bleeding from the eye, gums, or nose signs and symptoms of a blood clot such as  breathing problems; chest pain; severe, sudden headache; pain, swelling, warmth in the leg signs and symptoms of a stroke like changes in vision; confusion; trouble speaking or understanding; severe headaches; sudden numbness or weakness of the face, arm or leg; trouble walking; dizziness; loss of balance or coordination stomach pain sweating swelling of legs or ankles vomiting weight gain Side effects that usually do not require medical attention (report to your doctor or health care professional if they continue or are bothersome): back pain changes in taste decreased  appetite dry skin nausea tiredness This list may not describe all possible side effects. Call your doctor for medical advice about side effects. You may report side effects to FDA at 1-800-FDA-1088. Where should I keep my medication? This drug is given in a hospital or clinic and will not be stored at home. NOTE: This sheet is a summary. It may not cover all possible information. If you have questions about this medicine, talk to your doctor, pharmacist, or health care provider.  2022 Elsevier/Gold Standard (2021-04-19 00:00:00)

## 2021-09-22 NOTE — Progress Notes (Signed)
Patient presents for treatment. RN assessment completed along with the following:  Labs/vitals reviewed - Yes, and within treatment parameters.   Weight within 10% of previous measurement - Yes Informed consent completed and reflects current therapy/intent - Yes, on date 04/21/2020             Provider progress note reviewed - Yes, today's provider note was reviewed. Treatment/Antibody/Supportive plan reviewed - Yes, and there are no adjustments needed for today's treatment. S&H and other orders reviewed - Yes, and there are no additional orders identified. Previous treatment date reviewed - Yes, and the appropriate amount of time has elapsed between treatments. Clinic Hand Off Received from - Yes from [facility], RN  Patient to proceed with treatment.

## 2021-09-22 NOTE — Progress Notes (Signed)
Per Ned Card, ok to keep Zirabev dose 400 mg today.

## 2021-09-28 ENCOUNTER — Other Ambulatory Visit (HOSPITAL_COMMUNITY): Payer: Self-pay

## 2021-10-08 ENCOUNTER — Other Ambulatory Visit: Payer: Self-pay | Admitting: Oncology

## 2021-10-12 ENCOUNTER — Other Ambulatory Visit: Payer: Self-pay | Admitting: Oncology

## 2021-10-13 ENCOUNTER — Inpatient Hospital Stay: Payer: BC Managed Care – PPO

## 2021-10-13 ENCOUNTER — Inpatient Hospital Stay: Payer: BC Managed Care – PPO | Attending: Oncology

## 2021-10-13 ENCOUNTER — Other Ambulatory Visit: Payer: Self-pay

## 2021-10-13 ENCOUNTER — Telehealth: Payer: Self-pay | Admitting: *Deleted

## 2021-10-13 ENCOUNTER — Other Ambulatory Visit: Payer: Self-pay | Admitting: *Deleted

## 2021-10-13 ENCOUNTER — Encounter: Payer: Self-pay | Admitting: *Deleted

## 2021-10-13 ENCOUNTER — Inpatient Hospital Stay: Payer: BC Managed Care – PPO | Admitting: Oncology

## 2021-10-13 VITALS — BP 130/55 | HR 88 | Temp 97.8°F | Resp 20 | Ht 67.0 in | Wt 131.2 lb

## 2021-10-13 VITALS — BP 156/51 | HR 58

## 2021-10-13 DIAGNOSIS — C182 Malignant neoplasm of ascending colon: Secondary | ICD-10-CM

## 2021-10-13 DIAGNOSIS — Z5112 Encounter for antineoplastic immunotherapy: Secondary | ICD-10-CM | POA: Diagnosis not present

## 2021-10-13 DIAGNOSIS — D696 Thrombocytopenia, unspecified: Secondary | ICD-10-CM | POA: Insufficient documentation

## 2021-10-13 DIAGNOSIS — D500 Iron deficiency anemia secondary to blood loss (chronic): Secondary | ICD-10-CM | POA: Insufficient documentation

## 2021-10-13 DIAGNOSIS — K7460 Unspecified cirrhosis of liver: Secondary | ICD-10-CM | POA: Diagnosis not present

## 2021-10-13 DIAGNOSIS — C787 Secondary malignant neoplasm of liver and intrahepatic bile duct: Secondary | ICD-10-CM | POA: Diagnosis not present

## 2021-10-13 LAB — CMP (CANCER CENTER ONLY)
ALT: 35 U/L (ref 0–44)
AST: 40 U/L (ref 15–41)
Albumin: 3.2 g/dL — ABNORMAL LOW (ref 3.5–5.0)
Alkaline Phosphatase: 130 U/L — ABNORMAL HIGH (ref 38–126)
Anion gap: 7 (ref 5–15)
BUN: 17 mg/dL (ref 8–23)
CO2: 26 mmol/L (ref 22–32)
Calcium: 8.6 mg/dL — ABNORMAL LOW (ref 8.9–10.3)
Chloride: 108 mmol/L (ref 98–111)
Creatinine: 0.61 mg/dL (ref 0.44–1.00)
GFR, Estimated: >60 mL/min (ref >60)
Glucose, Bld: 130 mg/dL — ABNORMAL HIGH (ref 70–99)
Potassium: 3.9 mmol/L (ref 3.5–5.1)
Sodium: 141 mmol/L (ref 135–145)
Total Bilirubin: 0.6 mg/dL (ref 0.3–1.2)
Total Protein: 5.5 g/dL — ABNORMAL LOW (ref 6.5–8.1)

## 2021-10-13 LAB — CBC WITH DIFFERENTIAL (CANCER CENTER ONLY)
Abs Immature Granulocytes: 0 10*3/uL (ref 0.00–0.07)
Basophils Absolute: 0 10*3/uL (ref 0.0–0.1)
Basophils Relative: 1 %
Eosinophils Absolute: 0.2 10*3/uL (ref 0.0–0.5)
Eosinophils Relative: 6 %
HCT: 30.4 % — ABNORMAL LOW (ref 36.0–46.0)
Hemoglobin: 9.9 g/dL — ABNORMAL LOW (ref 12.0–15.0)
Immature Granulocytes: 0 %
Lymphocytes Relative: 21 %
Lymphs Abs: 0.6 10*3/uL — ABNORMAL LOW (ref 0.7–4.0)
MCH: 33.3 pg (ref 26.0–34.0)
MCHC: 32.6 g/dL (ref 30.0–36.0)
MCV: 102.4 fL — ABNORMAL HIGH (ref 80.0–100.0)
Monocytes Absolute: 0.3 10*3/uL (ref 0.1–1.0)
Monocytes Relative: 9 %
Neutro Abs: 1.7 10*3/uL (ref 1.7–7.7)
Neutrophils Relative %: 63 %
Platelet Count: 94 10*3/uL — ABNORMAL LOW (ref 150–400)
RBC: 2.97 MIL/uL — ABNORMAL LOW (ref 3.87–5.11)
RDW: 18.8 % — ABNORMAL HIGH (ref 11.5–15.5)
WBC Count: 2.7 10*3/uL — ABNORMAL LOW (ref 4.0–10.5)
nRBC: 0 % (ref 0.0–0.2)

## 2021-10-13 LAB — TOTAL PROTEIN, URINE DIPSTICK: Protein, ur: 100 mg/dL — AB

## 2021-10-13 LAB — CEA (ACCESS): CEA (CHCC): 94.32 ng/mL — ABNORMAL HIGH (ref 0.00–5.00)

## 2021-10-13 MED ORDER — PROCHLORPERAZINE MALEATE 10 MG PO TABS: ORAL_TABLET | ORAL | 1 refills | Status: DC

## 2021-10-13 MED ORDER — SODIUM CHLORIDE 0.9 % IV SOLN
7.5000 mg/kg | Freq: Once | INTRAVENOUS | Status: AC
  Administered 2021-10-13: 400 mg via INTRAVENOUS
  Filled 2021-10-13: qty 16

## 2021-10-13 MED ORDER — SODIUM CHLORIDE 0.9% FLUSH
10.0000 mL | INTRAVENOUS | Status: DC | PRN
  Administered 2021-10-13: 10 mL

## 2021-10-13 MED ORDER — MAGIC MOUTHWASH: 5.0000 mL | Freq: Four times a day (QID) | ORAL | 1 refills | Status: DC | PRN

## 2021-10-13 MED ORDER — HEPARIN SOD (PORK) LOCK FLUSH 100 UNIT/ML IV SOLN
500.0000 [IU] | Freq: Once | INTRAVENOUS | Status: AC | PRN
  Administered 2021-10-13: 500 [IU]

## 2021-10-13 MED ORDER — DIPHENOXYLATE-ATROPINE 2.5-0.025 MG PO TABS: 1.0000 | ORAL_TABLET | Freq: Four times a day (QID) | ORAL | 0 refills | Status: DC | PRN

## 2021-10-13 MED ORDER — SODIUM CHLORIDE 0.9 % IV SOLN: Freq: Once | INTRAVENOUS | Status: AC

## 2021-10-13 NOTE — Telephone Encounter (Signed)
Plans on cataract surgery in ~ 2 months and is asking when she needs to hold the Avastin before and after the procedure? ?Any need to hold the xeloda? ?

## 2021-10-13 NOTE — Telephone Encounter (Signed)
Left VM that Dr. Benay Spice said Avastin/Xeloda does not need to be held in regards to cataract surgery. ?

## 2021-10-13 NOTE — Telephone Encounter (Signed)
Scripts sent to [facility] on [address] per Dr. Benay Spice verbal order for Lomotil refill and MMW ?

## 2021-10-13 NOTE — Progress Notes (Signed)
Patient presents for treatment. RN assessment completed along with the following: ? ?Labs/vitals reviewed - Yes, and platelets 94,000 and urine protein 100. Okay to treat per Dr. Benay Spice.    ?Weight within 10% of previous measurement - Yes ?Informed consent completed and reflects current therapy/intent - Yes, on date 04/21/20             ?Provider progress note reviewed - Yes, today's provider note was reviewed. ?Treatment/Antibody/Supportive plan reviewed - Yes, and there are no adjustments needed for today's treatment. ?S&H and other orders reviewed - Yes, and there are no additional orders identified. ?Previous treatment date reviewed - Yes, and the appropriate amount of time has elapsed between treatments. ?Clinic Hand off Received from: Merceda Elks RN ? ?Patient to proceed with treatment.   ?

## 2021-10-13 NOTE — Progress Notes (Signed)
Patient seen by Dr. Benay Spice today ? ?Vitals are within treatment parameters. ? ?Labs reviewed by Dr. Benay Spice and are not all within treatment parameters.   ?Platelet 94,000 and urine protein 100= OK to treat per Dr. Benay Spice  ? ?Per physician team, patient is ready for treatment and there are NO modifications to the treatment plan.  ?

## 2021-10-13 NOTE — Progress Notes (Signed)
?[facility] ?OFFICE PROGRESS NOTE ? ? ?Diagnosis: Colon cancer ? ?INTERVAL HISTORY:  ? ?Stacy Burns returns as scheduled.  She continues Xeloda.  She held Xeloda for several days earlier this month when she developed ulcers of the right buccal mucosa.  These resolved.  She resume Xeloda.  No hand or foot pain.  No diarrhea.  She continues Lovenox. ? ?Objective: ? ?Vital signs in last 24 hours: ? ?Blood pressure (!) 130/55, pulse 88, temperature 97.8 ?F (36.6 ?C), temperature source Oral, resp. rate 20, height 5' 7" (1.702 m), weight 131 lb 3.2 oz (59.5 kg), SpO2 100 %. ?  ? ?HEENT: No thrush, healing ulcer at the right buccal mucosa ?Resp: Lungs clear bilaterally ?Cardio: Regular rate and rhythm ?GI: No hepatosplenomegaly ?Vascular: No leg edema  ?Skin: Ecchymoses over the extremities, palms without erythema ? ?Portacath/PICC-without erythema ? ?Lab Results: ? ?Lab Results  ?Component Value Date  ? WBC 2.7 (L) 10/13/2021  ? HGB 9.9 (L) 10/13/2021  ? HCT 30.4 (L) 10/13/2021  ? MCV 102.4 (H) 10/13/2021  ? PLT 94 (L) 10/13/2021  ? NEUTROABS 1.7 10/13/2021  ? ? ?CMP  ?Lab Results  ?Component Value Date  ? NA 143 09/22/2021  ? K 4.1 09/22/2021  ? CL 112 (H) 09/22/2021  ? CO2 25 09/22/2021  ? GLUCOSE 85 09/22/2021  ? BUN 25 (H) 09/22/2021  ? CREATININE 0.59 09/22/2021  ? CALCIUM 8.6 (L) 09/22/2021  ? PROT 5.8 (L) 09/22/2021  ? ALBUMIN 3.3 (L) 09/22/2021  ? AST 26 09/22/2021  ? ALT 22 09/22/2021  ? ALKPHOS 145 (H) 09/22/2021  ? BILITOT 0.4 09/22/2021  ? GFRNONAA >60 09/22/2021  ? GFRAA >60 05/06/2020  ? ? ?Lab Results  ?Component Value Date  ? CEA1 54.57 (H) 12/30/2020  ? CEA 85.73 (H) 09/22/2021  ? ? ?Medications: I have reviewed the patient's current medications. ? ? 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  Cirrhosis.  Innumerable liver lesions. ?Colonoscopy 12/18/2019-ulcerated  partially obstructing large mass in the mid ascending colon.  Biopsy invasive adenocarcinoma; preserved expression major MMR proteins ?Upper endoscopy 12/18/2019-normal esophagus, stomach, examined duodenum ?CEA 12/19/2019-8,923 ?Biopsy liver lesion 12/23/2019-adenocarcinoma ?Foundation 1-K-ras wild-type; tumor mutation burden greater than or equal to 10; microsatellite stable; BRAF V600E ?Cycle 1 FOLFOX 01/01/2020 ?Cycle 2 FOLFOX 01/15/2020  ?Cycle 3 FOLFOX 01/29/2020, Udenyca added ?Cycle 4 FOLFOX 02/12/2020, Udenyca held ?Cycle 5 FOLFOX 02/26/2020, Udenyca ?Cycle 6 FOLFOX 03/11/2020, Udenyca held ?CT abdomen/pelvis 03/16/2020-stable diffuse liver lesions, stable strictured appearing ascending colon, possible pericolonic implant, vague left lower lobe nodule ?Cycle 1 FOLFIRI/Avastin 04/06/2020 ?Cycle 2 FOLFIRI/Avastin 04/21/2020 ?Cycle 3 FOLFIRI/Avastin 05/06/2020 ?Cycle 4 FOLFIRI/Avastin 05/20/2020 ?Cycle 5 FOLFIRI/Avastin 06/03/2020 ?CTs 06/15/2020-mild to moderate improvement in hepatic metastasis.  No new or progressive disease.  Narrowing within the proximal ascending colon with upstream mild cecal and terminal ileum dilatation, similar. ?Cycle 6 FOLFIRI/Avastin 06/17/2020 ?Cycle 7 FOLFIRI/Avastin 07/01/2020 ?Cycle 8 FOLFIRI/Avastin 07/15/2020 ?Cycle 9 FOLFIRI/Avastin 07/29/2020-5-FU bolus eliminated, 5-FU infusion and irinotecan dose reduced ?Cycle 10 FOLFIRI/Avastin 08/12/2020 ?CTs 08/23/2020- mild residual wall thickening involving the cecum/ascending colon.  Numerous liver metastases improved. ?Cycle 11 FOLFIRI/Avastin 08/25/2020 ?Cycle 12 FOLFIRI/Avastin 09/16/2020 ?Cycle 13 FOLFIRI/Avastin 10/07/2020 ?Cycle 14 FOLFIRI/Avastin 10/28/2020 ?Cycle 15 FOLFIRI/Avastin 11/18/2020 ?CT abdomen/pelvis 12/06/2020-mild improvement in multifocal hepatic metastases, wall thickening at the ascending colon with pericolonic inflammatory changes ?Cycle 16 FOLFIRI/Avastin 12/09/2020 ?Cycle 17 FOLFIRI/Avastin 12/30/2020 ?Cycle 18 FOLFIRI/Avastin  01/20/2021 ?Cycle 19 FOLFIRI/Avastin 02/10/2021 ?  Cycle 20 FOLFIRI/Avastin 03/03/2021 ?Cycle 21 FOLFIRI/Avastin 03/24/2021 ?Cycle 22 FOLFIRI/Avastin 04/14/2021 ?CTs 05/04/2021-slight improvement in hepatic metastatic disease. ?Cycle 23 FOLFIRI/Avastin 05/05/2021 ?Cycle 24 FOLFIRI/Avastin 05/25/2021 ?Cycle 25 FOLFIRI/Avastin 06/15/2021 ?Cycle 26 FOLFIRI/Avastin 07/13/2021 ?Cycle 27 FOLFIRI/Avastin 08/02/2021 ?CTs 08/30/2021-majority of liver lesions are unchanged in size, 1 lesion adjacent to the gallbladder has increased, no adenopathy, persistent pericolonic stranding and peritoneal thickening at the cecum ?Progressive rise in CEA ?08/31/2021-treatment changed to Xeloda/bevacizumab, began Xeoloda 09/22/2021 ?Anemia secondary to GI blood loss, on oral iron BID  ?Cirrhosis ?12/26/2019-hospital admission for right lower extremity DVT and GI bleed, treated with heparin anticoagulation beginning 12/26/2019, converted to Lovenox at discharge 12/29/2019 ?Lovenox reduced to 40 mg daily 02/10/2021 secondary to bruising and nosebleeding ?History of low-grade fever-likely "tumor" fever ?COVID-19 infection January 2021 ?  ? ? ? ?Disposition: ?Stacy Burns appears stable.  She will complete another treatment with bevacizumab today.  She continues Xeloda.  We will follow-up on the CEA from today.  Xeloda will be maintained at the current dose.  We will dose reduce the Xeloda for recurrent mucositis. ? ?Stacy Burns will return for an office visit and Avastin in 3 weeks. ? ?Gary Sherrill, MD ? ?10/13/2021  ?9:03 AM ? ? ?

## 2021-10-13 NOTE — Patient Instructions (Signed)
[facility]  Discharge Instructions: Thank you for choosing [facility] to provide your oncology and hematology care.   If you have a lab appointment with the [facility], please go directly to the [facility] and check in at the registration area.   Wear comfortable clothing and clothing appropriate for easy access to any Portacath or PICC line.   We strive to give you quality time with your provider. You may need to reschedule your appointment if you arrive late (15 or more minutes).  Arriving late affects you and other patients whose appointments are after yours.  Also, if you miss three or more appointments without notifying the office, you may be dismissed from the clinic at the providers discretion.      For prescription refill requests, have your pharmacy contact our office and allow 72 hours for refills to be completed.    Today you received the following chemotherapy and/or immunotherapy agents: Avastin  Bevacizumab injection What is this medication? BEVACIZUMAB (be va SIZ yoo mab) is a monoclonal antibody. It is used to treat many types of cancer. This medicine may be used for other purposes; ask your health care provider or pharmacist if you have questions. COMMON BRAND NAME(S): Alymsys, Avastin, MVASI, [name] What should I tell my care team before I take this medication? They need to know if you have any of these conditions: diabetes heart disease high blood pressure history of coughing up blood prior anthracycline chemotherapy (e.g., doxorubicin, daunorubicin, epirubicin) recent or ongoing radiation therapy recent or planning to have surgery stroke an unusual or allergic reaction to bevacizumab, hamster proteins, mouse proteins, other medicines, foods, dyes, or preservatives pregnant or trying to get pregnant breast-feeding How should I use this medication? This medicine is for infusion into a vein. It is given by a health  care professional in a hospital or clinic setting. Talk to your pediatrician regarding the use of this medicine in children. Special care may be needed. Overdosage: If you think you have taken too much of this medicine contact a poison control center or emergency room at once. NOTE: This medicine is only for you. Do not share this medicine with others. What if I miss a dose? It is important not to miss your dose. Call your doctor or health care professional if you are unable to keep an appointment. What may interact with this medication? Interactions are not expected. This list may not describe all possible interactions. Give your health care provider a list of all the medicines, herbs, non-prescription drugs, or dietary supplements you use. Also tell them if you smoke, drink alcohol, or use illegal drugs. Some items may interact with your medicine. What should I watch for while using this medication? Your condition will be monitored carefully while you are receiving this medicine. You will need important blood work and urine testing done while you are taking this medicine. This medicine may increase your risk to bruise or bleed. Call your doctor or health care professional if you notice any unusual bleeding. Before having surgery, talk to your health care provider to make sure it is ok. This drug can increase the risk of poor healing of your surgical site or wound. You will need to stop this drug for 28 days before surgery. After surgery, wait at least 28 days before restarting this drug. Make sure the surgical site or wound is healed enough before restarting this drug. Talk to your health care provider if questions. Do  not become pregnant while taking this medicine or for 6 months after stopping it. Women should inform their doctor if they wish to become pregnant or think they might be pregnant. There is a potential for serious side effects to an unborn child. Talk to your health care professional or  pharmacist for more information. Do not breast-feed an infant while taking this medicine and for 6 months after the last dose. This medicine has caused ovarian failure in some women. This medicine may interfere with the ability to have a child. You should talk to your doctor or health care professional if you are concerned about your fertility. What side effects may I notice from receiving this medication? Side effects that you should report to your doctor or health care professional as soon as possible: allergic reactions like skin rash, itching or hives, swelling of the face, lips, or tongue chest pain or chest tightness chills coughing up blood high fever seizures severe constipation signs and symptoms of bleeding such as bloody or black, tarry stools; red or dark-[place] urine; spitting up blood or [place] material that looks like coffee grounds; red spots on the skin; unusual bruising or bleeding from the eye, gums, or nose signs and symptoms of a blood clot such as breathing problems; chest pain; severe, sudden headache; pain, swelling, warmth in the leg signs and symptoms of a stroke like changes in vision; confusion; trouble speaking or understanding; severe headaches; sudden numbness or weakness of the face, arm or leg; trouble walking; dizziness; loss of balance or coordination stomach pain sweating swelling of legs or ankles vomiting weight gain Side effects that usually do not require medical attention (report to your doctor or health care professional if they continue or are bothersome): back pain changes in taste decreased appetite dry skin nausea tiredness This list may not describe all possible side effects. Call your doctor for medical advice about side effects. You may report side effects to FDA at 1-800-FDA-1088. Where should I keep my medication? This drug is given in a hospital or clinic and will not be stored at home. NOTE: This sheet is a summary. It may not cover all  possible information. If you have questions about this medicine, talk to your doctor, pharmacist, or health care provider.  2022 Elsevier/Gold Standard (2021-04-19 00:00:00)    To help prevent nausea and vomiting after your treatment, we encourage you to take your nausea medication as directed.  BELOW ARE SYMPTOMS THAT SHOULD BE REPORTED IMMEDIATELY: *FEVER GREATER THAN 100.4 F (38 C) OR HIGHER *CHILLS OR SWEATING *NAUSEA AND VOMITING THAT IS NOT CONTROLLED WITH YOUR NAUSEA MEDICATION *UNUSUAL SHORTNESS OF BREATH *UNUSUAL BRUISING OR BLEEDING *URINARY PROBLEMS (pain or burning when urinating, or frequent urination) *BOWEL PROBLEMS (unusual diarrhea, constipation, pain near the anus) TENDERNESS IN MOUTH AND THROAT WITH OR WITHOUT PRESENCE OF ULCERS (sore throat, sores in mouth, or a toothache) UNUSUAL RASH, SWELLING OR PAIN  UNUSUAL VAGINAL DISCHARGE OR ITCHING   Items with * indicate a potential emergency and should be followed up as soon as possible or go to the Emergency Department if any problems should occur.  Please show the CHEMOTHERAPY ALERT CARD or IMMUNOTHERAPY ALERT CARD at check-in to the Emergency Department and triage nurse.  Should you have questions after your visit or need to cancel or reschedule your appointment, please contact [facility]  Dept: [phone]  and follow the prompts.  Office hours are 8:00 a.m. to 4:30 p.m. Monday - Friday. Please note  that voicemails left after 4:00 p.m. may not be returned until the following business day.  We are closed weekends and major holidays. You have access to a nurse at all times for urgent questions. Please call the main number to the clinic Dept: [phone] and follow the prompts.   For any non-urgent questions, you may also contact your provider using MyChart. We now offer e-Visits for anyone 39 and older to request care online for non-urgent symptoms. For details visit [email or web address].    Also download the MyChart app! Go to the app store, search "MyChart", open the app, select [facility], and log in with your MyChart username and password.  Due to Covid, a mask is required upon entering the hospital/clinic. If you do not have a mask, one will be given to you upon arrival. For doctor visits, patients may have 1 support person aged 40 or older with them. For treatment visits, patients cannot have anyone with them due to current Covid guidelines and our immunocompromised population.

## 2021-10-14 ENCOUNTER — Other Ambulatory Visit: Payer: Self-pay | Admitting: *Deleted

## 2021-10-14 DIAGNOSIS — C182 Malignant neoplasm of ascending colon: Secondary | ICD-10-CM

## 2021-10-14 MED ORDER — CAPECITABINE 500 MG PO TABS: ORAL_TABLET | ORAL | 0 refills | Status: DC

## 2021-10-14 NOTE — Telephone Encounter (Signed)
Received faxed refill request for capecitabine. Refill sent electronically. ?

## 2021-10-27 ENCOUNTER — Other Ambulatory Visit: Payer: Self-pay | Admitting: Oncology

## 2021-10-28 ENCOUNTER — Other Ambulatory Visit: Payer: Self-pay | Admitting: Oncology

## 2021-10-31 ENCOUNTER — Encounter: Payer: Self-pay | Admitting: Oncology

## 2021-11-03 ENCOUNTER — Other Ambulatory Visit: Payer: Self-pay

## 2021-11-03 ENCOUNTER — Inpatient Hospital Stay: Payer: BC Managed Care – PPO

## 2021-11-03 ENCOUNTER — Encounter: Payer: Self-pay | Admitting: Nurse Practitioner

## 2021-11-03 ENCOUNTER — Encounter: Payer: Self-pay | Admitting: *Deleted

## 2021-11-03 ENCOUNTER — Inpatient Hospital Stay (HOSPITAL_BASED_OUTPATIENT_CLINIC_OR_DEPARTMENT_OTHER): Payer: BC Managed Care – PPO | Admitting: Nurse Practitioner

## 2021-11-03 VITALS — BP 135/55 | HR 64 | Temp 97.8°F | Resp 20 | Ht 67.0 in | Wt 132.8 lb

## 2021-11-03 DIAGNOSIS — Z5112 Encounter for antineoplastic immunotherapy: Secondary | ICD-10-CM | POA: Diagnosis not present

## 2021-11-03 DIAGNOSIS — C182 Malignant neoplasm of ascending colon: Secondary | ICD-10-CM

## 2021-11-03 DIAGNOSIS — D500 Iron deficiency anemia secondary to blood loss (chronic): Secondary | ICD-10-CM | POA: Diagnosis not present

## 2021-11-03 DIAGNOSIS — K7460 Unspecified cirrhosis of liver: Secondary | ICD-10-CM | POA: Diagnosis not present

## 2021-11-03 DIAGNOSIS — D696 Thrombocytopenia, unspecified: Secondary | ICD-10-CM | POA: Diagnosis not present

## 2021-11-03 DIAGNOSIS — C787 Secondary malignant neoplasm of liver and intrahepatic bile duct: Secondary | ICD-10-CM | POA: Diagnosis not present

## 2021-11-03 LAB — CBC WITH DIFFERENTIAL (CANCER CENTER ONLY)
Abs Immature Granulocytes: 0.01 10*3/uL (ref 0.00–0.07)
Basophils Absolute: 0 10*3/uL (ref 0.0–0.1)
Basophils Relative: 1 %
Eosinophils Absolute: 0.2 10*3/uL (ref 0.0–0.5)
Eosinophils Relative: 5 %
HCT: 29.3 % — ABNORMAL LOW (ref 36.0–46.0)
Hemoglobin: 9.5 g/dL — ABNORMAL LOW (ref 12.0–15.0)
Immature Granulocytes: 0 %
Lymphocytes Relative: 26 %
Lymphs Abs: 0.8 10*3/uL (ref 0.7–4.0)
MCH: 34.1 pg — ABNORMAL HIGH (ref 26.0–34.0)
MCHC: 32.4 g/dL (ref 30.0–36.0)
MCV: 105 fL — ABNORMAL HIGH (ref 80.0–100.0)
Monocytes Absolute: 0.4 10*3/uL (ref 0.1–1.0)
Monocytes Relative: 12 %
Neutro Abs: 1.8 10*3/uL (ref 1.7–7.7)
Neutrophils Relative %: 56 %
Platelet Count: 98 10*3/uL — ABNORMAL LOW (ref 150–400)
RBC: 2.79 MIL/uL — ABNORMAL LOW (ref 3.87–5.11)
RDW: 20.2 % — ABNORMAL HIGH (ref 11.5–15.5)
WBC Count: 3.2 10*3/uL — ABNORMAL LOW (ref 4.0–10.5)
nRBC: 0 % (ref 0.0–0.2)

## 2021-11-03 LAB — CMP (CANCER CENTER ONLY)
ALT: 17 U/L (ref 0–44)
AST: 24 U/L (ref 15–41)
Albumin: 3.2 g/dL — ABNORMAL LOW (ref 3.5–5.0)
Alkaline Phosphatase: 121 U/L (ref 38–126)
Anion gap: 5 (ref 5–15)
BUN: 21 mg/dL (ref 8–23)
CO2: 28 mmol/L (ref 22–32)
Calcium: 8.7 mg/dL — ABNORMAL LOW (ref 8.9–10.3)
Chloride: 108 mmol/L (ref 98–111)
Creatinine: 0.59 mg/dL (ref 0.44–1.00)
GFR, Estimated: >60 mL/min (ref >60)
Glucose, Bld: 69 mg/dL — ABNORMAL LOW (ref 70–99)
Potassium: 4 mmol/L (ref 3.5–5.1)
Sodium: 141 mmol/L (ref 135–145)
Total Bilirubin: 0.5 mg/dL (ref 0.3–1.2)
Total Protein: 5.7 g/dL — ABNORMAL LOW (ref 6.5–8.1)

## 2021-11-03 LAB — CEA (ACCESS): CEA (CHCC): 90.21 ng/mL — ABNORMAL HIGH (ref 0.00–5.00)

## 2021-11-03 LAB — TOTAL PROTEIN, URINE DIPSTICK

## 2021-11-03 MED ORDER — SODIUM CHLORIDE 0.9 % IV SOLN
7.5000 mg/kg | Freq: Once | INTRAVENOUS | Status: AC
  Administered 2021-11-03: 400 mg via INTRAVENOUS
  Filled 2021-11-03: qty 16

## 2021-11-03 MED ORDER — SODIUM CHLORIDE 0.9% FLUSH
10.0000 mL | INTRAVENOUS | Status: DC | PRN
  Administered 2021-11-03: 10 mL

## 2021-11-03 MED ORDER — SODIUM CHLORIDE 0.9 % IV SOLN: Freq: Once | INTRAVENOUS | Status: AC

## 2021-11-03 MED ORDER — HEPARIN SOD (PORK) LOCK FLUSH 100 UNIT/ML IV SOLN
500.0000 [IU] | Freq: Once | INTRAVENOUS | Status: AC | PRN
  Administered 2021-11-03: 500 [IU]

## 2021-11-03 NOTE — Progress Notes (Signed)
?Stacy Burns ?OFFICE PROGRESS NOTE ? ? ?Diagnosis: Colon cancer ? ?INTERVAL HISTORY:  ? ?[name] returns as scheduled.  She continues Xeloda.  She denies nausea/vomiting.  No mouth sores.  No significant diarrhea.  No bleeding.  She notes normal easy bruising on the forearms.  No abdominal pain.  Appetite described as "pretty good". ? ?Objective: ? ?Vital signs in last 24 hours: ? ?Blood pressure (!) 135/55, pulse 64, temperature 97.8 ?F (36.6 ?C), temperature source Oral, resp. rate 20, height _0  (1.702 m), weight 132 lb 12.8 oz (60.2 kg), SpO2 100 %. ?  ? ?HEENT: No thrush or ulcers. ?Resp: Lungs clear bilaterally. ?Cardio: Regular rate and rhythm. ?GI: Abdomen soft and nontender.  No hepatomegaly. ?Vascular: Trace lower leg edema bilaterally. ?Skin: Ecchymoses scattered over upper extremities.  Palms without erythema. ?Port-A-Cath without erythema. ? ?Lab Results: ? ?Lab Results  ?Component Value Date  ? WBC 3.2 (L) 11/03/2021  ? HGB 9.5 (L) 11/03/2021  ? HCT 29.3 (L) 11/03/2021  ? MCV 105.0 (H) 11/03/2021  ? PLT 98 (L) 11/03/2021  ? NEUTROABS 1.8 11/03/2021  ? ? ?Imaging: ? ?No results found. ? ?Medications: I have reviewed the patient's current medications. ? 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  Cirrhosis.  Innumerable liver lesions. ?Colonoscopy 12/18/2019-ulcerated partially obstructing large mass in the mid ascending colon.  Biopsy invasive adenocarcinoma; preserved expression major MMR proteins ?Upper endoscopy 12/18/2019-normal esophagus, stomach, examined duodenum ?CEA 12/19/2019-8,923 ?Biopsy liver lesion 12/23/2019-adenocarcinoma ?Foundation 1-K-ras wild-type; tumor mutation burden greater than or equal to 10; microsatellite stable; BRAF V600E ?Cycle 1 FOLFOX 01/01/2020 ?Cycle 2 FOLFOX 01/15/2020  ?Cycle 3 FOLFOX 01/29/2020, Udenyca added ?Cycle 4 FOLFOX 02/12/2020,  Udenyca held ?Cycle 5 FOLFOX 02/26/2020, Udenyca ?Cycle 6 FOLFOX 03/11/2020, Udenyca held ?CT abdomen/pelvis 03/16/2020-stable diffuse liver lesions, stable strictured appearing ascending colon, possible pericolonic implant, vague left lower lobe nodule ?Cycle 1 FOLFIRI/Avastin 04/06/2020 ?Cycle 2 FOLFIRI/Avastin 04/21/2020 ?Cycle 3 FOLFIRI/Avastin 05/06/2020 ?Cycle 4 FOLFIRI/Avastin 05/20/2020 ?Cycle 5 FOLFIRI/Avastin 06/03/2020 ?CTs 06/15/2020-mild to moderate improvement in hepatic metastasis.  No new or progressive disease.  Narrowing within the proximal ascending colon with upstream mild cecal and terminal ileum dilatation, similar. ?Cycle 6 FOLFIRI/Avastin 06/17/2020 ?Cycle 7 FOLFIRI/Avastin 07/01/2020 ?Cycle 8 FOLFIRI/Avastin 07/15/2020 ?Cycle 9 FOLFIRI/Avastin 07/29/2020-5-FU bolus eliminated, 5-FU infusion and irinotecan dose reduced ?Cycle 10 FOLFIRI/Avastin 08/12/2020 ?CTs 08/23/2020- mild residual wall thickening involving the cecum/ascending colon.  Numerous liver metastases improved. ?Cycle 11 FOLFIRI/Avastin 08/25/2020 ?Cycle 12 FOLFIRI/Avastin 09/16/2020 ?Cycle 13 FOLFIRI/Avastin 10/07/2020 ?Cycle 14 FOLFIRI/Avastin 10/28/2020 ?Cycle 15 FOLFIRI/Avastin 11/18/2020 ?CT abdomen/pelvis 12/06/2020-mild improvement in multifocal hepatic metastases, wall thickening at the ascending colon with pericolonic inflammatory changes ?Cycle 16 FOLFIRI/Avastin 12/09/2020 ?Cycle 17 FOLFIRI/Avastin 12/30/2020 ?Cycle 18 FOLFIRI/Avastin 01/20/2021 ?Cycle 19 FOLFIRI/Avastin 02/10/2021 ?Cycle 20 FOLFIRI/Avastin 03/03/2021 ?Cycle 21 FOLFIRI/Avastin 03/24/2021 ?Cycle 22 FOLFIRI/Avastin 04/14/2021 ?CTs 05/04/2021-slight improvement in hepatic metastatic disease. 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  ?  ? ?Disposition: [name] appears stable.  There is no clinical evidence of disease progression.  Plan to continue Xeloda/bevacizumab as she is currently doing. ? ?CBC and chemistry panel from today reviewed.  Labs adequate to proceed as above.  She has stable mild to moderate thrombocytopenia.  She understands to contact the office with bleeding.  CEA is stable. ? ?She will return for lab, follow-up, Avastin in 4 weeks per her request.  She will contact the office in the interim with any problems. ? ? ? ?Ned Card ANP/GNP-BC  ? ?11/03/2021  ?10:02 AM ? ? ? ? ? ? ? ?

## 2021-11-03 NOTE — Progress Notes (Signed)
Patient seen by Ned Card NP today ? ?Vitals are within treatment parameters. ? ?Labs reviewed by Ned Card NP and are not all within treatment parameters. OK to treat w/platelet 98,000.  No concern for glucose of 69 today--feels well and had oatmeal and juice.  ?Trace urine protein OK ? ?Per physician team, patient is ready for treatment and there are NO modifications to the treatment plan.  ?

## 2021-11-03 NOTE — Progress Notes (Signed)
The following biosimilar Mvasi (bevacizumab-awwb) has been selected for use in this patient. Insurance approved switch due to [employer].  ?

## 2021-11-03 NOTE — Patient Instructions (Signed)
[facility]   ?Discharge Instructions: ?Thank you for choosing [facility] to provide your oncology and hematology care.  ? ?If you have a lab appointment with the [facility], please go directly to the [facility] and check in at the registration area. ?  ?Wear comfortable clothing and clothing appropriate for easy access to any Portacath or PICC line.  ? ?We strive to give you quality time with your provider. You may need to reschedule your appointment if you arrive late (15 or more minutes).  Arriving late affects you and other patients whose appointments are after yours.  Also, if you miss three or more appointments without notifying the office, you may be dismissed from the clinic at the provider?s discretion.    ?  ?For prescription refill requests, have your pharmacy contact our office and allow 72 hours for refills to be completed.   ? ?Today you received the following chemotherapy and/or immunotherapy agents Bevacizumab-bvzr (ZIRABEV).    ?  ?To help prevent nausea and vomiting after your treatment, we encourage you to take your nausea medication as directed. ? ?BELOW ARE SYMPTOMS THAT SHOULD BE REPORTED IMMEDIATELY: ?*FEVER GREATER THAN 100.4 F (38 ?C) OR HIGHER ?*CHILLS OR SWEATING ?*NAUSEA AND VOMITING THAT IS NOT CONTROLLED WITH YOUR NAUSEA MEDICATION ?*UNUSUAL SHORTNESS OF BREATH ?*UNUSUAL BRUISING OR BLEEDING ?*URINARY PROBLEMS (pain or burning when urinating, or frequent urination) ?*BOWEL PROBLEMS (unusual diarrhea, constipation, pain near the anus) ?TENDERNESS IN MOUTH AND THROAT WITH OR WITHOUT PRESENCE OF ULCERS (sore throat, sores in mouth, or a toothache) ?UNUSUAL RASH, SWELLING OR PAIN  ?UNUSUAL VAGINAL DISCHARGE OR ITCHING  ? ?Items with * indicate a potential emergency and should be followed up as soon as possible or go to the Emergency Department if any problems should occur. ? ?Please show the CHEMOTHERAPY ALERT CARD or IMMUNOTHERAPY ALERT CARD  at check-in to the Emergency Department and triage nurse. ? ?Should you have questions after your visit or need to cancel or reschedule your appointment, please contact [facility]  Dept: [phone]  and follow the prompts.  Office hours are 8:00 a.m. to 4:30 p.m. Monday - Friday. Please note that voicemails left after 4:00 p.m. may not be returned until the following business day.  We are closed weekends and major holidays. You have access to a nurse at all times for urgent questions. Please call the main number to the clinic Dept: [phone] and follow the prompts. ? ? ?For any non-urgent questions, you may also contact your provider using MyChart. We now offer e-Visits for anyone 72 and older to request care online for non-urgent symptoms. For details visit [email or web address]. ?  ?Also download the MyChart app! Go to the app store, search "MyChart", open the app, select [facility], and log in with your MyChart username and password. ? ?Due to Covid, a mask is required upon entering the hospital/clinic. If you do not have a mask, one will be given to you upon arrival. For doctor visits, patients may have 1 support person aged 41 or older with them. For treatment visits, patients cannot have anyone with them due to current Covid guidelines and our immunocompromised population.  ? ?Bevacizumab injection ?What is this medication? ?BEVACIZUMAB (be va SIZ yoo mab) is a monoclonal antibody. It is used to treat many types of cancer. ?This medicine may be used for other purposes; ask your health care provider or pharmacist if you have questions. ?COMMON BRAND NAME(S):  Alymsys, Avastin, MVASI, Zirabev ?What should I tell my care team before I take this medication? ?They need to know if you have any of these conditions: ?diabetes ?heart disease ?high blood pressure ?history of coughing up blood ?prior anthracycline chemotherapy (e.g., doxorubicin, daunorubicin, epirubicin) ?recent or  ongoing radiation therapy ?recent or planning to have surgery ?stroke ?an unusual or allergic reaction to bevacizumab, hamster proteins, mouse proteins, other medicines, foods, dyes, or preservatives ?pregnant or trying to get pregnant ?breast-feeding ?How should I use this medication? ?This medicine is for infusion into a vein. It is given by a health care professional in a hospital or clinic setting. ?Talk to your pediatrician regarding the use of this medicine in children. Special care may be needed. ?Overdosage: If you think you have taken too much of this medicine contact a poison control center or emergency room at once. ?NOTE: This medicine is only for you. Do not share this medicine with others. ?What if I miss a dose? ?It is important not to miss your dose. Call your doctor or health care professional if you are unable to keep an appointment. ?What may interact with this medication? ?Interactions are not expected. ?This list may not describe all possible interactions. Give your health care provider a list of all the medicines, herbs, non-prescription drugs, or dietary supplements you use. Also tell them if you smoke, drink alcohol, or use illegal drugs. Some items may interact with your medicine. ?What should I watch for while using this medication? ?Your condition will be monitored carefully while you are receiving this medicine. You will need important blood work and urine testing done while you are taking this medicine. ?This medicine may increase your risk to bruise or bleed. Call your doctor or health care professional if you notice any unusual bleeding. ?Before having surgery, talk to your health care provider to make sure it is ok. This drug can increase the risk of poor healing of your surgical site or wound. You will need to stop this drug for 28 days before surgery. After surgery, wait at least 28 days before restarting this drug. Make sure the surgical site or wound is healed enough before  restarting this drug. Talk to your health care provider if questions. ?Do not become pregnant while taking this medicine or for 6 months after stopping it. Women should inform their doctor if they wish to become pregnant or think they might be pregnant. There is a potential for serious side effects to an unborn child. Talk to your health care professional or pharmacist for more information. Do not breast-feed an infant while taking this medicine and for 6 months after the last dose. ?This medicine has caused ovarian failure in some women. This medicine may interfere with the ability to have a child. You should talk to your doctor or health care professional if you are concerned about your fertility. ?What side effects may I notice from receiving this medication? ?Side effects that you should report to your doctor or health care professional as soon as possible: ?allergic reactions like skin rash, itching or hives, swelling of the face, lips, or tongue ?chest pain or chest tightness ?chills ?coughing up blood ?high fever ?seizures ?severe constipation ?signs and symptoms of bleeding such as bloody or black, tarry stools; red or dark-[place] urine; spitting up blood or [place] material that looks like coffee grounds; red spots on the skin; unusual bruising or bleeding from the eye, gums, or nose ?signs and symptoms of a blood clot such as  breathing problems; chest pain; severe, sudden headache; pain, swelling, warmth in the leg ?signs and symptoms of a stroke like changes in vision; confusion; trouble speaking or understanding; severe headaches; sudden numbness or weakness of the face, arm or leg; trouble walking; dizziness; loss of balance or coordination ?stomach pain ?sweating ?swelling of legs or ankles ?vomiting ?weight gain ?Side effects that usually do not require medical attention (report to your doctor or health care professional if they continue or are bothersome): ?back pain ?changes in taste ?decreased  appetite ?dry skin ?nausea ?tiredness ?This list may not describe all possible side effects. Call your doctor for medical advice about side effects. You may report side effects to FDA at 1-800-FDA-1088. ?Where shoul

## 2021-11-03 NOTE — Progress Notes (Signed)
Pt left prior to d/c BP. Pt left ambulatory with no complaints. ?

## 2021-11-07 IMAGING — CR DG CHEST 2V
2 series · 2 of 2 positions shown · non-contrast
Comparison: 12/25/2013

CLINICAL DATA: History of YSCR3-LN positivity in Thursday August, 2019
with increasing shortness of breath

EXAM:
CHEST - 2 VIEW

[w chest pa]
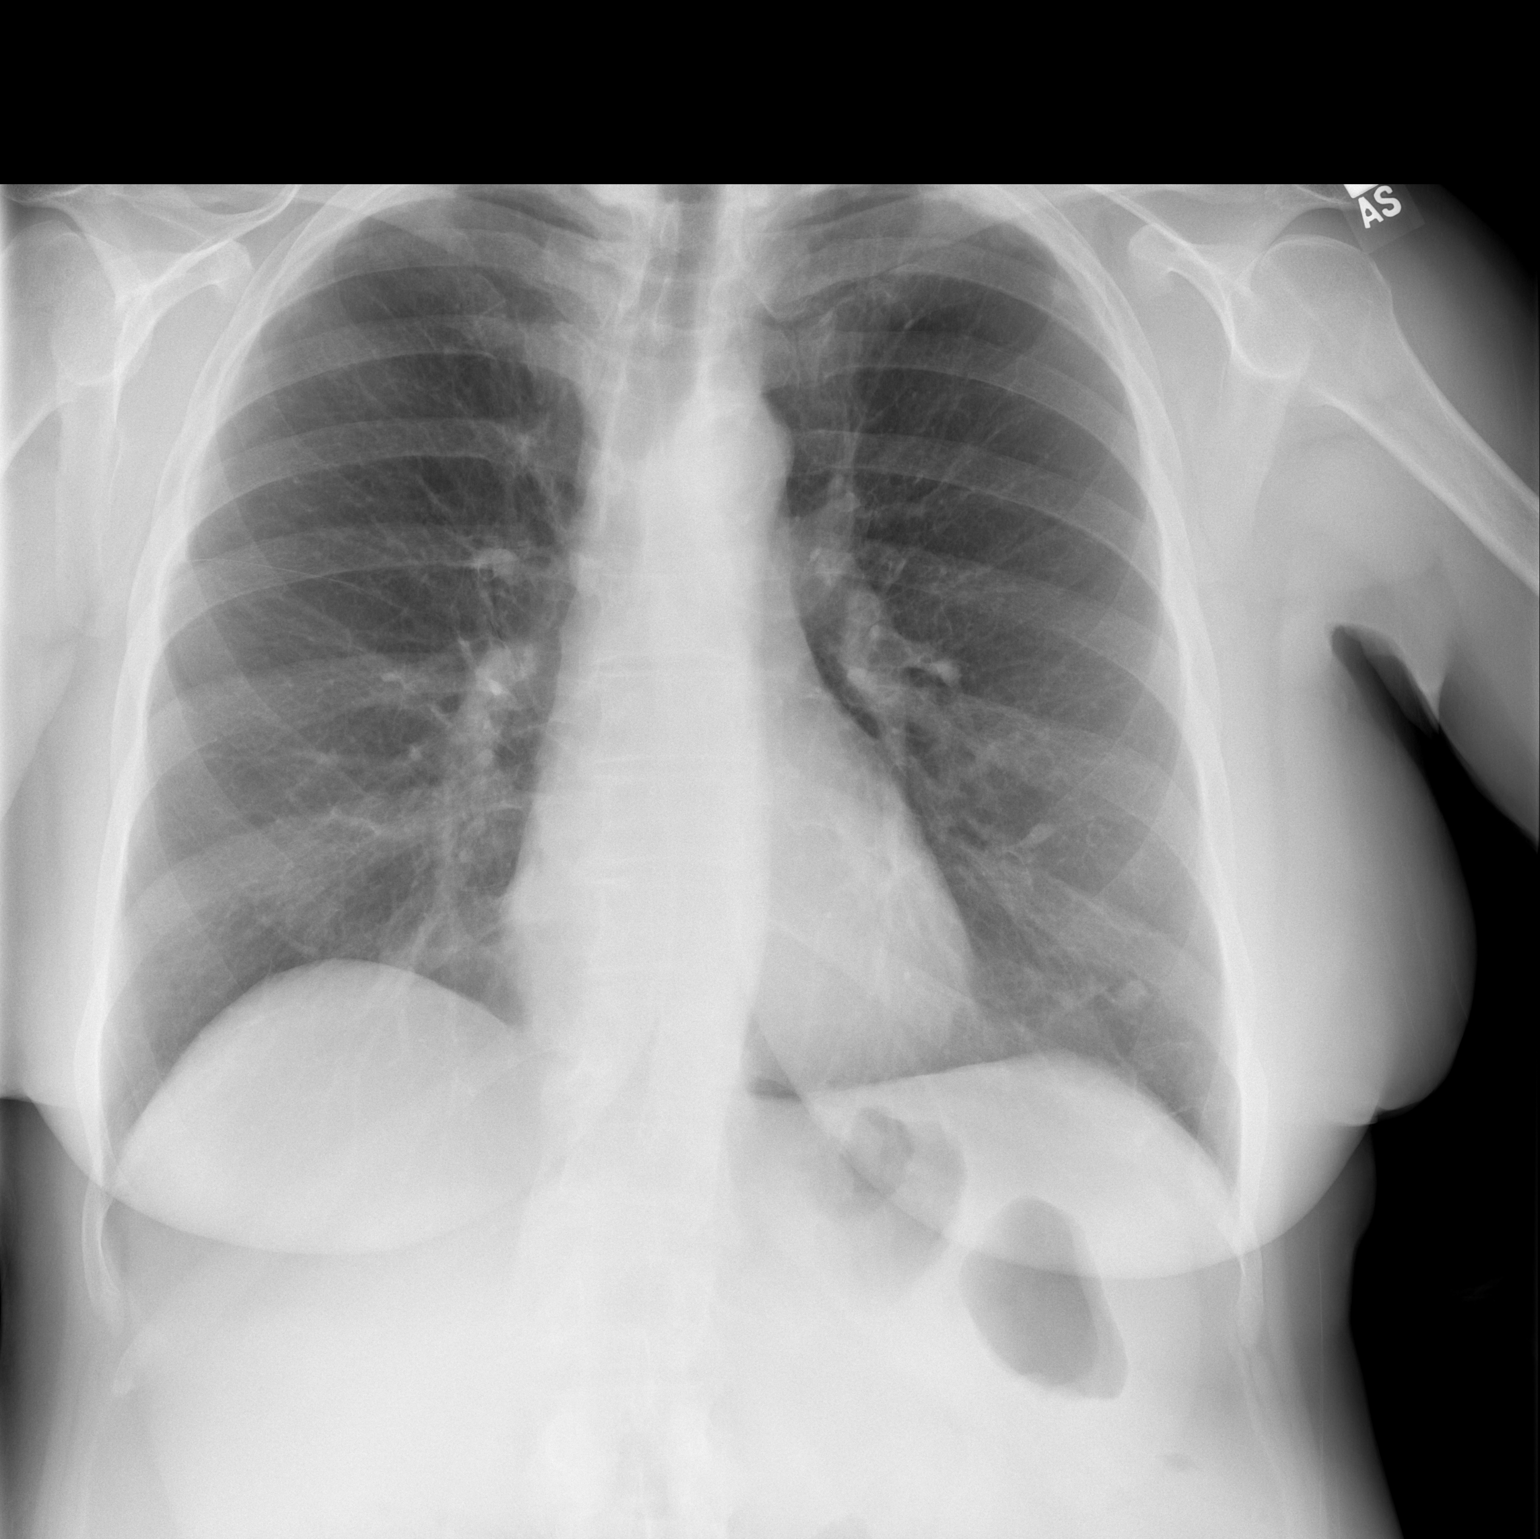

[w chest lat]
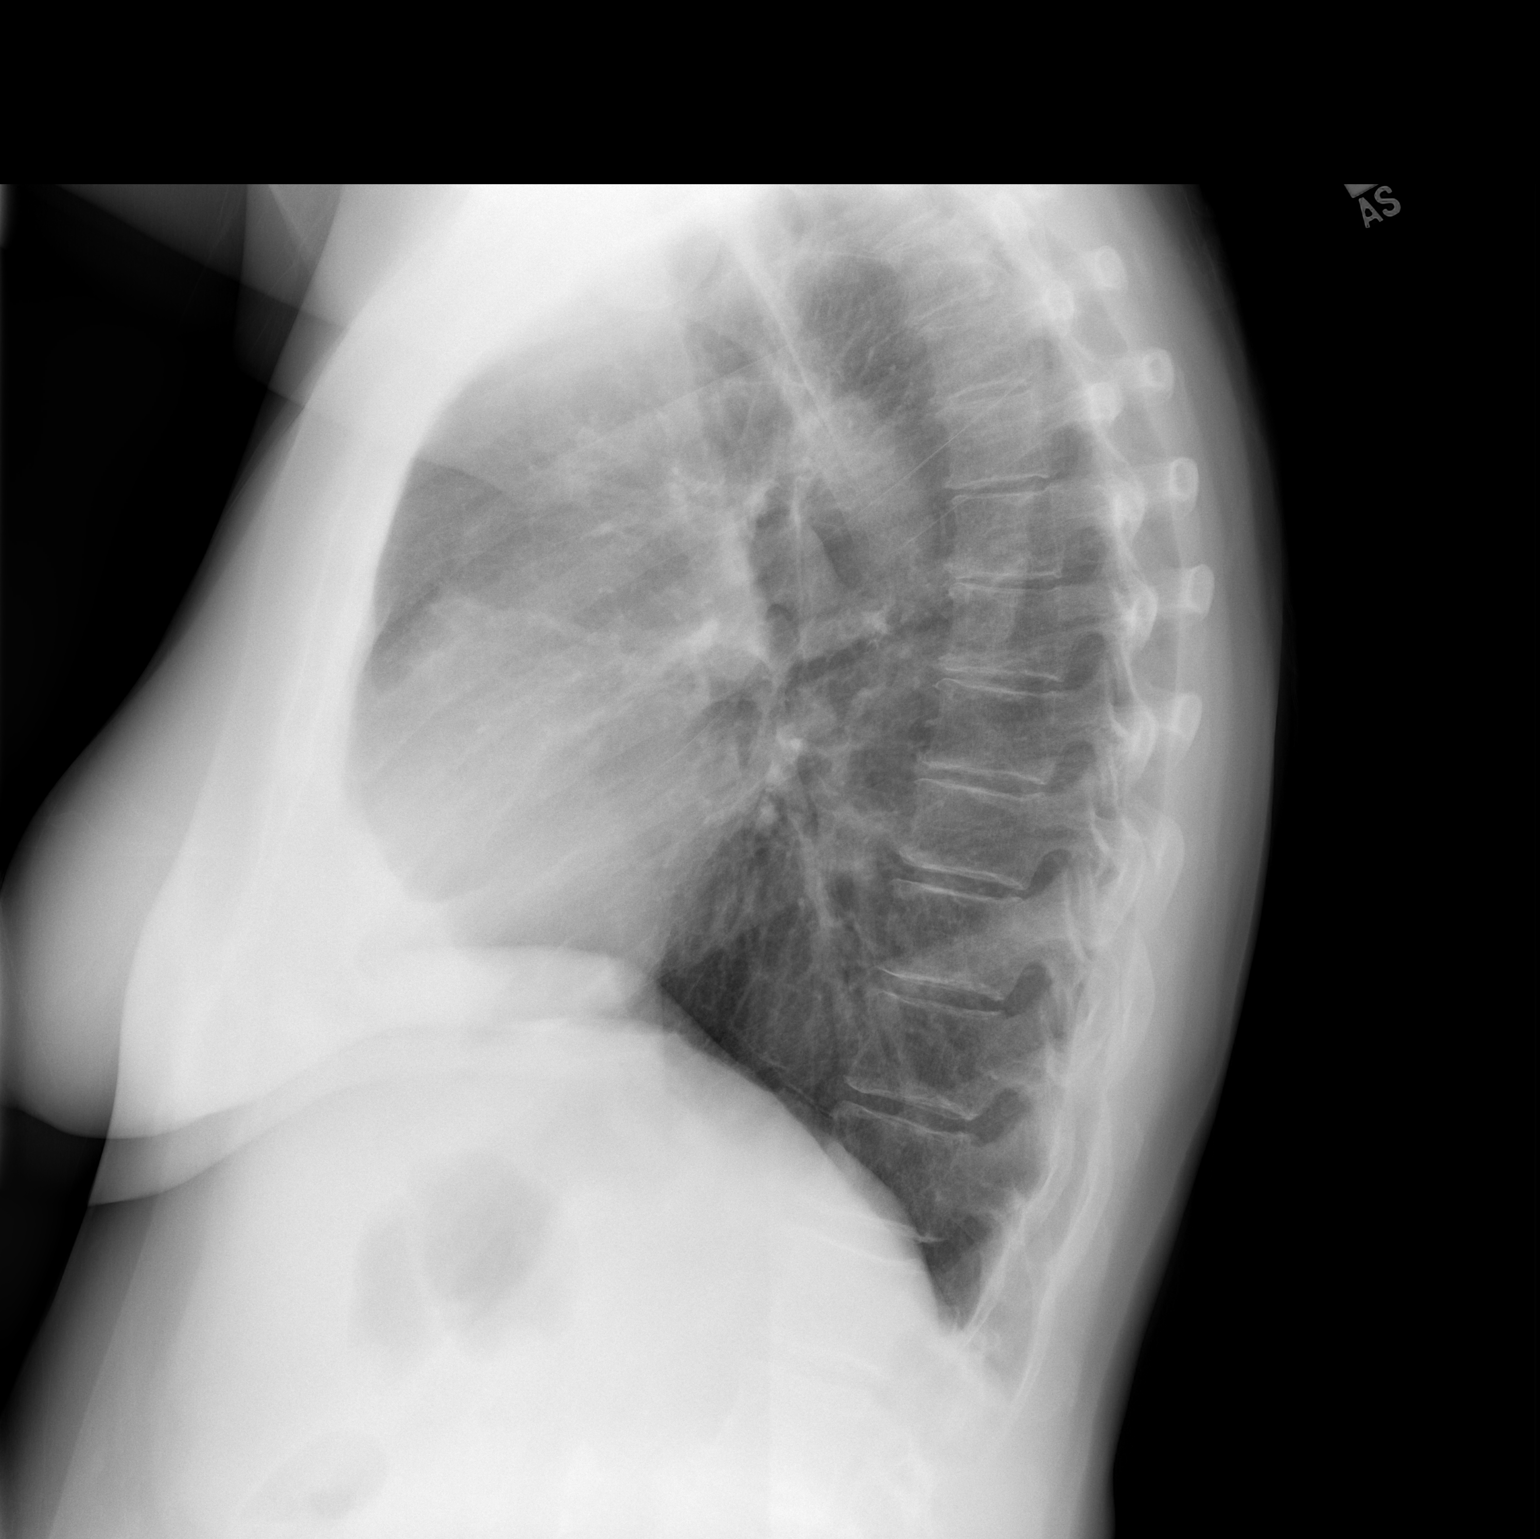

[2 of 2 positions shown; findings below may reference images not displayed]

FINDINGS: Cardiac shadow is within normal limits. Calcified granuloma is noted
in left lung base. No sizable infiltrate or effusion is seen. No
bony abnormality is noted.
IMPRESSION: No active cardiopulmonary disease.

## 2021-11-13 ENCOUNTER — Other Ambulatory Visit: Payer: Self-pay | Admitting: Oncology

## 2021-11-24 ENCOUNTER — Other Ambulatory Visit: Payer: Self-pay | Admitting: Oncology

## 2021-12-01 ENCOUNTER — Inpatient Hospital Stay: Payer: BC Managed Care – PPO

## 2021-12-01 ENCOUNTER — Inpatient Hospital Stay (HOSPITAL_BASED_OUTPATIENT_CLINIC_OR_DEPARTMENT_OTHER): Payer: BC Managed Care – PPO | Admitting: Oncology

## 2021-12-01 ENCOUNTER — Encounter: Payer: Self-pay | Admitting: *Deleted

## 2021-12-01 ENCOUNTER — Inpatient Hospital Stay: Payer: BC Managed Care – PPO | Attending: Oncology

## 2021-12-01 VITALS — BP 134/59 | HR 67 | Temp 97.8°F | Resp 18 | Ht 67.0 in | Wt 129.2 lb

## 2021-12-01 VITALS — BP 157/58 | HR 56 | Temp 98.1°F

## 2021-12-01 DIAGNOSIS — C787 Secondary malignant neoplasm of liver and intrahepatic bile duct: Secondary | ICD-10-CM | POA: Diagnosis not present

## 2021-12-01 DIAGNOSIS — Z5112 Encounter for antineoplastic immunotherapy: Secondary | ICD-10-CM | POA: Insufficient documentation

## 2021-12-01 DIAGNOSIS — C182 Malignant neoplasm of ascending colon: Secondary | ICD-10-CM

## 2021-12-01 DIAGNOSIS — D500 Iron deficiency anemia secondary to blood loss (chronic): Secondary | ICD-10-CM | POA: Diagnosis not present

## 2021-12-01 DIAGNOSIS — K7460 Unspecified cirrhosis of liver: Secondary | ICD-10-CM | POA: Diagnosis not present

## 2021-12-01 LAB — CBC WITH DIFFERENTIAL (CANCER CENTER ONLY)
Abs Immature Granulocytes: 0.01 10*3/uL (ref 0.00–0.07)
Basophils Absolute: 0 10*3/uL (ref 0.0–0.1)
Basophils Relative: 1 %
Eosinophils Absolute: 0.1 10*3/uL (ref 0.0–0.5)
Eosinophils Relative: 3 %
HCT: 31.2 % — ABNORMAL LOW (ref 36.0–46.0)
Hemoglobin: 10.2 g/dL — ABNORMAL LOW (ref 12.0–15.0)
Immature Granulocytes: 0 %
Lymphocytes Relative: 25 %
Lymphs Abs: 0.8 10*3/uL (ref 0.7–4.0)
MCH: 35.7 pg — ABNORMAL HIGH (ref 26.0–34.0)
MCHC: 32.7 g/dL (ref 30.0–36.0)
MCV: 109.1 fL — ABNORMAL HIGH (ref 80.0–100.0)
Monocytes Absolute: 0.5 10*3/uL (ref 0.1–1.0)
Monocytes Relative: 15 %
Neutro Abs: 1.9 10*3/uL (ref 1.7–7.7)
Neutrophils Relative %: 56 %
Platelet Count: 94 10*3/uL — ABNORMAL LOW (ref 150–400)
RBC: 2.86 MIL/uL — ABNORMAL LOW (ref 3.87–5.11)
RDW: 19.1 % — ABNORMAL HIGH (ref 11.5–15.5)
WBC Count: 3.3 10*3/uL — ABNORMAL LOW (ref 4.0–10.5)
nRBC: 0 % (ref 0.0–0.2)

## 2021-12-01 LAB — CMP (CANCER CENTER ONLY)
ALT: 19 U/L (ref 0–44)
AST: 27 U/L (ref 15–41)
Albumin: 3.5 g/dL (ref 3.5–5.0)
Alkaline Phosphatase: 116 U/L (ref 38–126)
Anion gap: 7 (ref 5–15)
BUN: 22 mg/dL (ref 8–23)
CO2: 25 mmol/L (ref 22–32)
Calcium: 9.6 mg/dL (ref 8.9–10.3)
Chloride: 107 mmol/L (ref 98–111)
Creatinine: 0.75 mg/dL (ref 0.44–1.00)
GFR, Estimated: >60 mL/min (ref >60)
Glucose, Bld: 94 mg/dL (ref 70–99)
Potassium: 4.1 mmol/L (ref 3.5–5.1)
Sodium: 139 mmol/L (ref 135–145)
Total Bilirubin: 0.6 mg/dL (ref 0.3–1.2)
Total Protein: 5.9 g/dL — ABNORMAL LOW (ref 6.5–8.1)

## 2021-12-01 LAB — URINALYSIS, COMPLETE (UACMP) WITH MICROSCOPIC
Bilirubin Urine: NEGATIVE
Glucose, UA: NEGATIVE mg/dL
Hgb urine dipstick: NEGATIVE
Ketones, ur: NEGATIVE mg/dL
Nitrite: NEGATIVE
Protein, ur: 100 mg/dL — AB
Specific Gravity, Urine: 1.026 (ref 1.005–1.030)
pH: 6 (ref 5.0–8.0)

## 2021-12-01 LAB — TOTAL PROTEIN, URINE DIPSTICK: Protein, ur: 300 mg/dL — AB

## 2021-12-01 LAB — CEA (ACCESS): CEA (CHCC): 106.94 ng/mL — ABNORMAL HIGH (ref 0.00–5.00)

## 2021-12-01 MED ORDER — SODIUM CHLORIDE 0.9 % IV SOLN
7.5000 mg/kg | Freq: Once | INTRAVENOUS | Status: AC
  Administered 2021-12-01: 400 mg via INTRAVENOUS
  Filled 2021-12-01: qty 16

## 2021-12-01 MED ORDER — SODIUM CHLORIDE 0.9% FLUSH
10.0000 mL | INTRAVENOUS | Status: DC | PRN
  Administered 2021-12-01: 10 mL

## 2021-12-01 MED ORDER — SODIUM CHLORIDE 0.9 % IV SOLN: Freq: Once | INTRAVENOUS | Status: AC

## 2021-12-01 MED ORDER — HEPARIN SOD (PORK) LOCK FLUSH 100 UNIT/ML IV SOLN
500.0000 [IU] | Freq: Once | INTRAVENOUS | Status: AC | PRN
  Administered 2021-12-01: 500 [IU]

## 2021-12-01 NOTE — Progress Notes (Signed)
Patient seen by Dr. Benay Spice today ? ?Vitals are within treatment parameters. ? ?Labs reviewed by Dr. Benay Spice and are not all within treatment parameters. Platelet 94,000--OK to treat ? ?Per physician team,  Ready to treat with no modifications pending result of urine protein.   ?

## 2021-12-01 NOTE — Progress Notes (Signed)
U/A reviewed by Ned Card, NP that shows urine protein 100. Confirmed w/lab that U/A is more accurate than dipstick. ?OK to proceed with Avastin today. ?

## 2021-12-01 NOTE — Progress Notes (Signed)
?[facility] ?OFFICE PROGRESS NOTE ? ? ?Diagnosis: Colon cancer ? ?INTERVAL HISTORY:  ? ?Stacy Burns completed another treatment with Avastin on 10/31/2021.  No bleeding or symptom of thrombosis.  She continues Xeloda.  She reports callus formation at the soles.  The skin has peeled at the fingertips.  No pain.  She continues Lovenox.  Good appetite.  She is working. ? ?Objective: ? ?Vital signs in last 24 hours: ? ?Blood pressure (!) 134/59, pulse 67, temperature 97.8 ?F (36.6 ?C), temperature source Oral, resp. rate 18, height $RemoveBe'5\' 7"'oIotwYTuL$  (1.702 m), weight 129 lb 3.2 oz (58.6 kg), SpO2 100 %. ?  ? ?HEENT: No thrush or ulcers ?Resp: Lungs clear bilaterally ?Cardio: Regular rate and rhythm ?GI: No hepatosplenomegaly ?Vascular: No leg edema  ?Skin: Mild erythema and skin thickening of the palms and fingers, callus formation, hyperpigmentation, and dryness of the soles ? ?Portacath/PICC-without erythema ? ?Lab Results: ? ?Lab Results  ?Component Value Date  ? WBC 3.3 (L) 12/01/2021  ? HGB 10.2 (L) 12/01/2021  ? HCT 31.2 (L) 12/01/2021  ? MCV 109.1 (H) 12/01/2021  ? PLT 94 (L) 12/01/2021  ? NEUTROABS 1.9 12/01/2021  ? ? ?CMP  ?Lab Results  ?Component Value Date  ? NA 141 11/03/2021  ? K 4.0 11/03/2021  ? CL 108 11/03/2021  ? CO2 28 11/03/2021  ? GLUCOSE 69 (L) 11/03/2021  ? BUN 21 11/03/2021  ? CREATININE 0.59 11/03/2021  ? CALCIUM 8.7 (L) 11/03/2021  ? PROT 5.7 (L) 11/03/2021  ? ALBUMIN 3.2 (L) 11/03/2021  ? AST 24 11/03/2021  ? ALT 17 11/03/2021  ? ALKPHOS 121 11/03/2021  ? BILITOT 0.5 11/03/2021  ? GFRNONAA >60 11/03/2021  ? GFRAA >60 05/06/2020  ? ? ?Lab Results  ?Component Value Date  ? CEA1 54.57 (H) 12/30/2020  ? CEA 90.21 (H) 11/03/2021  ? ? ? ?Medications: I have reviewed the patient's current medications. ? ? 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  Cirrhosis.  Innumerable liver  lesions. ?Colonoscopy 12/18/2019-ulcerated partially obstructing large mass in the mid ascending colon.  Biopsy invasive adenocarcinoma; preserved expression major MMR proteins ?Upper endoscopy 12/18/2019-normal esophagus, stomach, examined duodenum ?CEA 12/19/2019-8,923 ?Biopsy liver lesion 12/23/2019-adenocarcinoma ?Foundation 1-K-ras wild-type; tumor mutation burden greater than or equal to 10; microsatellite stable; BRAF V600E ?Cycle 1 FOLFOX 01/01/2020 ?Cycle 2 FOLFOX 01/15/2020  ?Cycle 3 FOLFOX 01/29/2020, Udenyca added ?Cycle 4 FOLFOX 02/12/2020, Udenyca held ?Cycle 5 FOLFOX 02/26/2020, Udenyca ?Cycle 6 FOLFOX 03/11/2020, Udenyca held ?CT abdomen/pelvis 03/16/2020-stable diffuse liver lesions, stable strictured appearing ascending colon, possible pericolonic implant, vague left lower lobe nodule ?Cycle 1 FOLFIRI/Avastin 04/06/2020 ?Cycle 2 FOLFIRI/Avastin 04/21/2020 ?Cycle 3 FOLFIRI/Avastin 05/06/2020 ?Cycle 4 FOLFIRI/Avastin 05/20/2020 ?Cycle 5 FOLFIRI/Avastin 06/03/2020 ?CTs 06/15/2020-mild to moderate improvement in hepatic metastasis.  No new or progressive disease.  Narrowing within the proximal ascending colon with upstream mild cecal and terminal ileum dilatation, similar. ?Cycle 6 FOLFIRI/Avastin 06/17/2020 ?Cycle 7 FOLFIRI/Avastin 07/01/2020 ?Cycle 8 FOLFIRI/Avastin 07/15/2020 ?Cycle 9 FOLFIRI/Avastin 07/29/2020-5-FU bolus eliminated, 5-FU infusion and irinotecan dose reduced ?Cycle 10 FOLFIRI/Avastin 08/12/2020 ?CTs 08/23/2020- mild residual wall thickening involving the cecum/ascending colon.  Numerous liver metastases improved. ?Cycle 11 FOLFIRI/Avastin 08/25/2020 ?Cycle 12 FOLFIRI/Avastin 09/16/2020 ?Cycle 13 FOLFIRI/Avastin 10/07/2020 ?Cycle 14 FOLFIRI/Avastin 10/28/2020 ?Cycle 15 FOLFIRI/Avastin 11/18/2020 ?CT abdomen/pelvis 12/06/2020-mild improvement in multifocal hepatic metastases, wall thickening at the ascending colon with pericolonic inflammatory changes ?Cycle 16 FOLFIRI/Avastin 12/09/2020 ?Cycle 17 FOLFIRI/Avastin  12/30/2020 ?Cycle  18 FOLFIRI/Avastin 01/20/2021 ?Cycle 19 FOLFIRI/Avastin 02/10/2021 ?Cycle 20 FOLFIRI/Avastin 03/03/2021 ?Cycle 21 FOLFIRI/Avastin 03/24/2021 ?Cycle 22 FOLFIRI/Avastin 04/14/2021 ?CTs 05/04/2021-slight improvement in hepatic metastatic disease. ?Cycle 23 FOLFIRI/Avastin 05/05/2021 ?Cycle 24 FOLFIRI/Avastin 05/25/2021 ?Cycle 25 FOLFIRI/Avastin 06/15/2021 ?Cycle 26 FOLFIRI/Avastin 07/13/2021 ?Cycle 27 FOLFIRI/Avastin 08/02/2021 ?CTs 08/30/2021-majority of liver lesions are unchanged in size, 1 lesion adjacent to the gallbladder has increased, no adenopathy, persistent pericolonic stranding and peritoneal thickening at the cecum ?Progressive rise in CEA ?08/31/2021-treatment changed to Xeloda/bevacizumab, began [place] 09/22/2021 ?CEA stable 11/03/2021 ?Xeloda/bevacizumab continued ?Anemia secondary to GI blood loss, on oral iron BID  ?Cirrhosis ?12/26/2019-hospital admission for right lower extremity DVT and GI bleed, treated with heparin anticoagulation beginning 12/26/2019, converted to Lovenox at discharge 12/29/2019 ?Lovenox reduced to 40 mg daily 02/10/2021 secondary to bruising and nosebleeding ?History of low-grade fever-likely "tumor" fever ?COVID-19 infection January 2021 ?  ?  ? ? ?Disposition: ?Stacy Burns appears stable.  She is tolerating the Xeloda and Avastin well.  The plan is to continue the current treatment.  We will follow-up on the CEA from today.  She will return for an office visit and Avastin in 3 weeks.  She will be scheduled for restaging CTs within the next few months. ? ?[name], MD ? ?12/01/2021  ?9:29 AM ? ? ?

## 2021-12-01 NOTE — Progress Notes (Signed)
Patient presents for treatment. RN assessment completed along with the following: ? ?Labs/vitals reviewed - Yes. Ok to proceed with urine protein today of 100 per Cristy Friedlander, RN per Ned Card, NP ?Weight within 10% of previous measurement - Yes ?Informed consent completed and reflects current therapy/intent - Yes, on date 04/21/2020             ?Provider progress note reviewed - Today's provider note is not yet available. I reviewed the most recent oncology provider progress note in chart dated 11/03/2021. ?Treatment/Antibody/Supportive plan reviewed - Yes, and there are no adjustments needed for today's treatment. ?S&H and other orders reviewed - Yes, and there are no additional orders identified. ?Previous treatment date reviewed - Yes, and the appropriate amount of time has elapsed between treatments. ?Clinic Hand Off Received from - Cristy Friedlander, RN ? ?Patient to proceed with treatment.  ? ?

## 2021-12-01 NOTE — Patient Instructions (Signed)
[facility]  Discharge Instructions: ?Thank you for choosing [facility] to provide your oncology and hematology care.  ? ?If you have a lab appointment with the [facility], please go directly to the [facility] and check in at the registration area. ?  ?Wear comfortable clothing and clothing appropriate for easy access to any Portacath or PICC line.  ? ?We strive to give you quality time with your provider. You may need to reschedule your appointment if you arrive late (15 or more minutes).  Arriving late affects you and other patients whose appointments are after yours.  Also, if you miss three or more appointments without notifying the office, you may be dismissed from the clinic at the provider?s discretion.    ?  ?For prescription refill requests, have your pharmacy contact our office and allow 72 hours for refills to be completed.   ? ?Today you received the following chemotherapy and/or immunotherapy agents Bevacizumab    ?  ?To help prevent nausea and vomiting after your treatment, we encourage you to take your nausea medication as directed. ? ?BELOW ARE SYMPTOMS THAT SHOULD BE REPORTED IMMEDIATELY: ?*FEVER GREATER THAN 100.4 F (38 ?C) OR HIGHER ?*CHILLS OR SWEATING ?*NAUSEA AND VOMITING THAT IS NOT CONTROLLED WITH YOUR NAUSEA MEDICATION ?*UNUSUAL SHORTNESS OF BREATH ?*UNUSUAL BRUISING OR BLEEDING ?*URINARY PROBLEMS (pain or burning when urinating, or frequent urination) ?*BOWEL PROBLEMS (unusual diarrhea, constipation, pain near the anus) ?TENDERNESS IN MOUTH AND THROAT WITH OR WITHOUT PRESENCE OF ULCERS (sore throat, sores in mouth, or a toothache) ?UNUSUAL RASH, SWELLING OR PAIN  ?UNUSUAL VAGINAL DISCHARGE OR ITCHING  ? ?Items with * indicate a potential emergency and should be followed up as soon as possible or go to the Emergency Department if any problems should occur. ? ?Please show the CHEMOTHERAPY ALERT CARD or IMMUNOTHERAPY ALERT CARD at check-in to  the Emergency Department and triage nurse. ? ?Should you have questions after your visit or need to cancel or reschedule your appointment, please contact [facility]  Dept: [phone]  and follow the prompts.  Office hours are 8:00 a.m. to 4:30 p.m. Monday - Friday. Please note that voicemails left after 4:00 p.m. may not be returned until the following business day.  We are closed weekends and major holidays. You have access to a nurse at all times for urgent questions. Please call the main number to the clinic Dept: [phone] and follow the prompts. ? ? ?For any non-urgent questions, you may also contact your provider using MyChart. We now offer e-Visits for anyone 43 and older to request care online for non-urgent symptoms. For details visit [email or web address]. ?  ?Also download the MyChart app! Go to the app store, search "MyChart", open the app, select [facility], and log in with your MyChart username and password. ? ?Due to Covid, a mask is required upon entering the hospital/clinic. If you do not have a mask, one will be given to you upon arrival. For doctor visits, patients may have 1 support person aged 49 or older with them. For treatment visits, patients cannot have anyone with them due to current Covid guidelines and our immunocompromised population.  ? ?

## 2021-12-01 NOTE — Patient Instructions (Signed)

## 2021-12-08 ENCOUNTER — Other Ambulatory Visit: Payer: Self-pay | Admitting: Oncology

## 2021-12-13 ENCOUNTER — Other Ambulatory Visit: Payer: Self-pay | Admitting: *Deleted

## 2021-12-13 DIAGNOSIS — C182 Malignant neoplasm of ascending colon: Secondary | ICD-10-CM

## 2021-12-13 MED ORDER — CAPECITABINE 500 MG PO TABS: ORAL_TABLET | ORAL | 0 refills | Status: DC

## 2021-12-16 ENCOUNTER — Other Ambulatory Visit: Payer: Self-pay | Admitting: Oncology

## 2021-12-20 DIAGNOSIS — H25043 Posterior subcapsular polar age-related cataract, bilateral: Secondary | ICD-10-CM | POA: Diagnosis not present

## 2021-12-20 DIAGNOSIS — H2511 Age-related nuclear cataract, right eye: Secondary | ICD-10-CM | POA: Diagnosis not present

## 2021-12-20 DIAGNOSIS — H2513 Age-related nuclear cataract, bilateral: Secondary | ICD-10-CM | POA: Diagnosis not present

## 2021-12-20 DIAGNOSIS — H25013 Cortical age-related cataract, bilateral: Secondary | ICD-10-CM | POA: Diagnosis not present

## 2021-12-20 DIAGNOSIS — H18413 Arcus senilis, bilateral: Secondary | ICD-10-CM | POA: Diagnosis not present

## 2021-12-22 ENCOUNTER — Inpatient Hospital Stay: Payer: BC Managed Care – PPO | Attending: Oncology

## 2021-12-22 ENCOUNTER — Other Ambulatory Visit: Payer: Self-pay | Admitting: *Deleted

## 2021-12-22 ENCOUNTER — Encounter: Payer: Self-pay | Admitting: *Deleted

## 2021-12-22 ENCOUNTER — Inpatient Hospital Stay (HOSPITAL_BASED_OUTPATIENT_CLINIC_OR_DEPARTMENT_OTHER): Payer: BC Managed Care – PPO | Admitting: Oncology

## 2021-12-22 ENCOUNTER — Inpatient Hospital Stay: Payer: BC Managed Care – PPO

## 2021-12-22 VITALS — BP 133/58 | HR 87 | Temp 98.2°F | Resp 18 | Ht 67.0 in | Wt 131.4 lb

## 2021-12-22 DIAGNOSIS — D500 Iron deficiency anemia secondary to blood loss (chronic): Secondary | ICD-10-CM | POA: Insufficient documentation

## 2021-12-22 DIAGNOSIS — Z7901 Long term (current) use of anticoagulants: Secondary | ICD-10-CM | POA: Insufficient documentation

## 2021-12-22 DIAGNOSIS — C787 Secondary malignant neoplasm of liver and intrahepatic bile duct: Secondary | ICD-10-CM | POA: Diagnosis not present

## 2021-12-22 DIAGNOSIS — C182 Malignant neoplasm of ascending colon: Secondary | ICD-10-CM

## 2021-12-22 DIAGNOSIS — Z452 Encounter for adjustment and management of vascular access device: Secondary | ICD-10-CM | POA: Insufficient documentation

## 2021-12-22 DIAGNOSIS — K7460 Unspecified cirrhosis of liver: Secondary | ICD-10-CM | POA: Diagnosis not present

## 2021-12-22 DIAGNOSIS — Z79899 Other long term (current) drug therapy: Secondary | ICD-10-CM | POA: Diagnosis not present

## 2021-12-22 DIAGNOSIS — Z86718 Personal history of other venous thrombosis and embolism: Secondary | ICD-10-CM | POA: Insufficient documentation

## 2021-12-22 DIAGNOSIS — Z95828 Presence of other vascular implants and grafts: Secondary | ICD-10-CM

## 2021-12-22 LAB — CMP (CANCER CENTER ONLY)
ALT: 16 U/L (ref 0–44)
AST: 24 U/L (ref 15–41)
Albumin: 3.3 g/dL — ABNORMAL LOW (ref 3.5–5.0)
Alkaline Phosphatase: 109 U/L (ref 38–126)
Anion gap: 10 (ref 5–15)
BUN: 27 mg/dL — ABNORMAL HIGH (ref 8–23)
CO2: 23 mmol/L (ref 22–32)
Calcium: 8.6 mg/dL — ABNORMAL LOW (ref 8.9–10.3)
Chloride: 109 mmol/L (ref 98–111)
Creatinine: 0.7 mg/dL (ref 0.44–1.00)
GFR, Estimated: >60 mL/min (ref >60)
Glucose, Bld: 69 mg/dL — ABNORMAL LOW (ref 70–99)
Potassium: 3.7 mmol/L (ref 3.5–5.1)
Sodium: 142 mmol/L (ref 135–145)
Total Bilirubin: 0.7 mg/dL (ref 0.3–1.2)
Total Protein: 5.7 g/dL — ABNORMAL LOW (ref 6.5–8.1)

## 2021-12-22 LAB — URINALYSIS, COMPLETE (UACMP) WITH MICROSCOPIC
Bilirubin Urine: NEGATIVE
Glucose, UA: NEGATIVE mg/dL
Ketones, ur: NEGATIVE mg/dL
Leukocytes,Ua: NEGATIVE
Nitrite: NEGATIVE
Protein, ur: >300 mg/dL — AB
Specific Gravity, Urine: 1.029 (ref 1.005–1.030)
pH: 6 (ref 5.0–8.0)

## 2021-12-22 LAB — CBC WITH DIFFERENTIAL (CANCER CENTER ONLY)
Abs Immature Granulocytes: 0.01 10*3/uL (ref 0.00–0.07)
Basophils Absolute: 0 10*3/uL (ref 0.0–0.1)
Basophils Relative: 1 %
Eosinophils Absolute: 0.1 10*3/uL (ref 0.0–0.5)
Eosinophils Relative: 3 %
HCT: 30.4 % — ABNORMAL LOW (ref 36.0–46.0)
Hemoglobin: 10.1 g/dL — ABNORMAL LOW (ref 12.0–15.0)
Immature Granulocytes: 0 %
Lymphocytes Relative: 25 %
Lymphs Abs: 0.9 10*3/uL (ref 0.7–4.0)
MCH: 37.3 pg — ABNORMAL HIGH (ref 26.0–34.0)
MCHC: 33.2 g/dL (ref 30.0–36.0)
MCV: 112.2 fL — ABNORMAL HIGH (ref 80.0–100.0)
Monocytes Absolute: 0.4 10*3/uL (ref 0.1–1.0)
Monocytes Relative: 12 %
Neutro Abs: 2.1 10*3/uL (ref 1.7–7.7)
Neutrophils Relative %: 59 %
Platelet Count: 95 10*3/uL — ABNORMAL LOW (ref 150–400)
RBC: 2.71 MIL/uL — ABNORMAL LOW (ref 3.87–5.11)
RDW: 18.4 % — ABNORMAL HIGH (ref 11.5–15.5)
WBC Count: 3.5 10*3/uL — ABNORMAL LOW (ref 4.0–10.5)
nRBC: 0 % (ref 0.0–0.2)

## 2021-12-22 LAB — TOTAL PROTEIN, URINE DIPSTICK: Protein, ur: 300 mg/dL — AB

## 2021-12-22 LAB — CEA (ACCESS): CEA (CHCC): 128.51 ng/mL — ABNORMAL HIGH (ref 0.00–5.00)

## 2021-12-22 MED ORDER — HEPARIN SOD (PORK) LOCK FLUSH 100 UNIT/ML IV SOLN
500.0000 [IU] | Freq: Once | INTRAVENOUS | Status: AC
  Administered 2021-12-22: 500 [IU] via INTRAVENOUS

## 2021-12-22 MED ORDER — SODIUM CHLORIDE 0.9% FLUSH
10.0000 mL | INTRAVENOUS | Status: AC | PRN
  Administered 2021-12-22: 10 mL via INTRAVENOUS

## 2021-12-22 NOTE — Progress Notes (Signed)
?[facility] ?OFFICE PROGRESS NOTE ? ? ?Diagnosis: Colon cancer ? ?INTERVAL HISTORY:  ? ?Stacy Burns returns as scheduled.  She continues treatment with capecitabine and bevacizumab.  She was last treated with bevacizumab 12/01/2021.  No bleeding or symptom of thrombosis.  No abdominal pain.  Intermittent diarrhea is improved with Imodium.  She continues Lovenox.  She plans to have cataract surgery within the next few months. ? ?Objective: ? ?Vital signs in last 24 hours: ? ?Blood pressure (!) 133/58, pulse 87, temperature 98.2 ?F (36.8 ?C), temperature source Oral, resp. rate 18, height $RemoveBe'5\' 7"'LxNwyFuNb$  (1.702 m), weight 131 lb 6.4 oz (59.6 kg), SpO2 100 %. ?  ? ?HEENT: No thrush or ulcers ?Resp: Lungs clear bilaterally ?Cardio: Regular rate and rhythm ?GI: Nontender, no hepatosplenomegaly, no mass ?Vascular: No leg edema  ?Skin: Mild erythema and callus formation at the soles, ecchymoses over the forearms ? ?Portacath/PICC-without erythema ? ?Lab Results: ? ?Lab Results  ?Component Value Date  ? WBC 3.5 (L) 12/22/2021  ? HGB 10.1 (L) 12/22/2021  ? HCT 30.4 (L) 12/22/2021  ? MCV 112.2 (H) 12/22/2021  ? PLT 95 (L) 12/22/2021  ? NEUTROABS 2.1 12/22/2021  ? ? ?CMP  ?Lab Results  ?Component Value Date  ? NA 139 12/01/2021  ? K 4.1 12/01/2021  ? CL 107 12/01/2021  ? CO2 25 12/01/2021  ? GLUCOSE 94 12/01/2021  ? BUN 22 12/01/2021  ? CREATININE 0.75 12/01/2021  ? CALCIUM 9.6 12/01/2021  ? PROT 5.9 (L) 12/01/2021  ? ALBUMIN 3.5 12/01/2021  ? AST 27 12/01/2021  ? ALT 19 12/01/2021  ? ALKPHOS 116 12/01/2021  ? BILITOT 0.6 12/01/2021  ? GFRNONAA >60 12/01/2021  ? GFRAA >60 05/06/2020  ? ? ?Lab Results  ?Component Value Date  ? CEA1 54.57 (H) 12/30/2020  ? CEA 106.94 (H) 12/01/2021  ? ? ? ?Medications: I have reviewed the patient's current medications. ? ? 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   Cirrhosis.  Innumerable liver lesions. ?Colonoscopy 12/18/2019-ulcerated partially obstructing large mass in the mid ascending colon.  Biopsy invasive adenocarcinoma; preserved expression major MMR proteins ?Upper endoscopy 12/18/2019-normal esophagus, stomach, examined duodenum ?CEA 12/19/2019-8,923 ?Biopsy liver lesion 12/23/2019-adenocarcinoma ?Foundation 1-K-ras wild-type; tumor mutation burden greater than or equal to 10; microsatellite stable; BRAF V600E ?Cycle 1 FOLFOX 01/01/2020 ?Cycle 2 FOLFOX 01/15/2020  ?Cycle 3 FOLFOX 01/29/2020, Udenyca added ?Cycle 4 FOLFOX 02/12/2020, Udenyca held ?Cycle 5 FOLFOX 02/26/2020, Udenyca ?Cycle 6 FOLFOX 03/11/2020, Udenyca held ?CT abdomen/pelvis 03/16/2020-stable diffuse liver lesions, stable strictured appearing ascending colon, possible pericolonic implant, vague left lower lobe nodule ?Cycle 1 FOLFIRI/Avastin 04/06/2020 ?Cycle 2 FOLFIRI/Avastin 04/21/2020 ?Cycle 3 FOLFIRI/Avastin 05/06/2020 ?Cycle 4 FOLFIRI/Avastin 05/20/2020 ?Cycle 5 FOLFIRI/Avastin 06/03/2020 ?CTs 06/15/2020-mild to moderate improvement in hepatic metastasis.  No new or progressive disease.  Narrowing within the proximal ascending colon with upstream mild cecal and terminal ileum dilatation, similar. ?Cycle 6 FOLFIRI/Avastin 06/17/2020 ?Cycle 7 FOLFIRI/Avastin 07/01/2020 ?Cycle 8 FOLFIRI/Avastin 07/15/2020 ?Cycle 9 FOLFIRI/Avastin 07/29/2020-5-FU bolus eliminated, 5-FU infusion and irinotecan dose reduced ?Cycle 10 FOLFIRI/Avastin 08/12/2020 ?CTs 08/23/2020- mild residual wall thickening involving the cecum/ascending colon.  Numerous liver metastases improved. ?Cycle 11 FOLFIRI/Avastin 08/25/2020 ?Cycle 12 FOLFIRI/Avastin 09/16/2020 ?Cycle 13 FOLFIRI/Avastin 10/07/2020 ?Cycle 14 FOLFIRI/Avastin 10/28/2020 ?Cycle 15 FOLFIRI/Avastin 11/18/2020 ?CT abdomen/pelvis 12/06/2020-mild improvement in multifocal hepatic metastases, wall thickening at the ascending colon with pericolonic inflammatory changes ?Cycle 16 FOLFIRI/Avastin  12/09/2020 ?Cycle 17 FOLFIRI/Avastin 12/30/2020 ?Cycle 18  FOLFIRI/Avastin 01/20/2021 ?Cycle 19 FOLFIRI/Avastin 02/10/2021 ?Cycle 20 FOLFIRI/Avastin 03/03/2021 ?Cycle 21 FOLFIRI/Avastin 03/24/2021 ?Cycle 22 FOLFIRI/Avastin 04/14/2021 ?CTs 05/04/2021-slight improvement in hepatic metastatic disease. ?Cycle 23 FOLFIRI/Avastin 05/05/2021 ?Cycle 24 FOLFIRI/Avastin 05/25/2021 ?Cycle 25 FOLFIRI/Avastin 06/15/2021 ?Cycle 26 FOLFIRI/Avastin 07/13/2021 ?Cycle 27 FOLFIRI/Avastin 08/02/2021 ?CTs 08/30/2021-majority of liver lesions are unchanged in size, 1 lesion adjacent to the gallbladder has increased, no adenopathy, persistent pericolonic stranding and peritoneal thickening at the cecum ?Progressive rise in CEA ?08/31/2021-treatment changed to Xeloda/bevacizumab, began [place] 09/22/2021 ?CEA stable 11/03/2021 ?Xeloda/bevacizumab continued ?Anemia secondary to GI blood loss, on oral iron BID  ?Cirrhosis ?12/26/2019-hospital admission for right lower extremity DVT and GI bleed, treated with heparin anticoagulation beginning 12/26/2019, converted to Lovenox at discharge 12/29/2019 ?Lovenox reduced to 40 mg daily 02/10/2021 secondary to bruising and nosebleeding ?History of low-grade fever-likely "tumor" fever ?COVID-19 infection January 2021 ?  ?  ?Disposition: ?Stacy Burns appears stable.  She will continue capecitabine and bevacizumab.  We will check a urine protein level today.  She will undergo a restaging CT evaluation prior to an office visit in 3 weeks. ? ?Stacy Coder, MD ? ?12/22/2021  ?9:32 AM ? ? ?

## 2021-12-22 NOTE — Progress Notes (Signed)
Order placed for 24 hour urine ? ?

## 2021-12-22 NOTE — Progress Notes (Signed)
Urine protein per dipstick = 300. MD requested complete U/A to confirm. Order placed and lab aware to process now. ?

## 2021-12-22 NOTE — Progress Notes (Signed)
Patient seen by Dr. Benay Spice today ? ?Vitals are within treatment parameters. ? ?Labs reviewed by Dr. Benay Spice and are not all within treatment parameters. Platelets 95,000 --OK to proceed ?UA protein > 300 with trace Hgb noted. ? ?Per physician team, patient will not be receiving treatment today. ?Will obtain 24 hour urine for protein.  ?

## 2021-12-22 NOTE — Addendum Note (Signed)
Addended by: Velna Hatchet on: 12/22/2021 11:06 AM ? ? Modules accepted: Orders ? ?

## 2021-12-22 NOTE — Progress Notes (Deleted)
?[facility] ?OFFICE PROGRESS NOTE ? ? ?Diagnosis:  ? ?INTERVAL HISTORY:  ? ?*** ? ?Objective: ? ?Vital signs in last 24 hours: ? ?Blood pressure (!) 133/58, pulse 87, temperature 98.2 ?F (36.8 ?C), temperature source Oral, resp. rate 18, height $RemoveBe'5\' 7"'tYcjdwZcQ$  (1.702 m), weight 131 lb 6.4 oz (59.6 kg), SpO2 100 %. ?  ? ?HEENT: *** ?Lymphatics: *** ?Resp: *** ?Cardio: *** ?GI: *** ?Vascular: *** ?Neuro:***  ?Skin:***  ? ?Portacath/PICC-without erythema ? ?Lab Results: ? ?Lab Results  ?Component Value Date  ? WBC 3.5 (L) 12/22/2021  ? HGB 10.1 (L) 12/22/2021  ? HCT 30.4 (L) 12/22/2021  ? MCV 112.2 (H) 12/22/2021  ? PLT 95 (L) 12/22/2021  ? NEUTROABS 2.1 12/22/2021  ? ? ?CMP  ?Lab Results  ?Component Value Date  ? NA 142 12/22/2021  ? K 3.7 12/22/2021  ? CL 109 12/22/2021  ? CO2 23 12/22/2021  ? GLUCOSE 69 (L) 12/22/2021  ? BUN 27 (H) 12/22/2021  ? CREATININE 0.70 12/22/2021  ? CALCIUM 8.6 (L) 12/22/2021  ? PROT 5.7 (L) 12/22/2021  ? ALBUMIN 3.3 (L) 12/22/2021  ? AST 24 12/22/2021  ? ALT 16 12/22/2021  ? ALKPHOS 109 12/22/2021  ? BILITOT 0.7 12/22/2021  ? GFRNONAA >60 12/22/2021  ? GFRAA >60 05/06/2020  ? ? ?Lab Results  ?Component Value Date  ? CEA1 54.57 (H) 12/30/2020  ? CEA 106.94 (H) 12/01/2021  ? ? ?Lab Results  ?Component Value Date  ? INR 1.1 12/26/2019  ? LABPROT 13.6 12/26/2019  ? ? ?Imaging: ? ?No results found. ? ?Medications: I have reviewed the patient's current medications. ? ? 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  Cirrhosis.  Innumerable liver lesions. ?Colonoscopy 12/18/2019-ulcerated partially obstructing large mass in the mid ascending colon.  Biopsy invasive adenocarcinoma; preserved expression major MMR proteins ?Upper endoscopy 12/18/2019-normal esophagus, stomach, examined duodenum ?CEA 12/19/2019-8,923 ?Biopsy liver lesion 12/23/2019-adenocarcinoma ?Foundation 1-K-ras  wild-type; tumor mutation burden greater than or equal to 10; microsatellite stable; BRAF V600E ?Cycle 1 FOLFOX 01/01/2020 ?Cycle 2 FOLFOX 01/15/2020  ?Cycle 3 FOLFOX 01/29/2020, Udenyca added ?Cycle 4 FOLFOX 02/12/2020, Udenyca held ?Cycle 5 FOLFOX 02/26/2020, Udenyca ?Cycle 6 FOLFOX 03/11/2020, Udenyca held ?CT abdomen/pelvis 03/16/2020-stable diffuse liver lesions, stable strictured appearing ascending colon, possible pericolonic implant, vague left lower lobe nodule ?Cycle 1 FOLFIRI/Avastin 04/06/2020 ?Cycle 2 FOLFIRI/Avastin 04/21/2020 ?Cycle 3 FOLFIRI/Avastin 05/06/2020 ?Cycle 4 FOLFIRI/Avastin 05/20/2020 ?Cycle 5 FOLFIRI/Avastin 06/03/2020 ?CTs 06/15/2020-mild to moderate improvement in hepatic metastasis.  No new or progressive disease.  Narrowing within the proximal ascending colon with upstream mild cecal and terminal ileum dilatation, similar. ?Cycle 6 FOLFIRI/Avastin 06/17/2020 ?Cycle 7 FOLFIRI/Avastin 07/01/2020 ?Cycle 8 FOLFIRI/Avastin 07/15/2020 ?Cycle 9 FOLFIRI/Avastin 07/29/2020-5-FU bolus eliminated, 5-FU infusion and irinotecan dose reduced ?Cycle 10 FOLFIRI/Avastin 08/12/2020 ?CTs 08/23/2020- mild residual wall thickening involving the cecum/ascending colon.  Numerous liver metastases improved. ?Cycle 11 FOLFIRI/Avastin 08/25/2020 ?Cycle 12 FOLFIRI/Avastin 09/16/2020 ?Cycle 13 FOLFIRI/Avastin 10/07/2020 ?Cycle 14 FOLFIRI/Avastin 10/28/2020 ?Cycle 15 FOLFIRI/Avastin 11/18/2020 ?CT abdomen/pelvis 12/06/2020-mild improvement in multifocal hepatic metastases, wall thickening at the ascending colon with pericolonic inflammatory changes ?Cycle 16 FOLFIRI/Avastin 12/09/2020 ?Cycle 17 FOLFIRI/Avastin 12/30/2020 ?Cycle 18 FOLFIRI/Avastin 01/20/2021 ?Cycle 19 FOLFIRI/Avastin 02/10/2021 ?Cycle 20 FOLFIRI/Avastin 03/03/2021 ?Cycle 21 FOLFIRI/Avastin 03/24/2021 ?Cycle 22 FOLFIRI/Avastin 04/14/2021 ?CTs 05/04/2021-slight improvement in hepatic metastatic disease. ?Cycle 23 FOLFIRI/Avastin 05/05/2021 ?Cycle 24 FOLFIRI/Avastin 05/25/2021 ?Cycle 25  FOLFIRI/Avastin 06/15/2021 ?Cycle 26 FOLFIRI/Avastin 07/13/2021 ?Cycle 27 FOLFIRI/Avastin 08/02/2021 ?CTs 08/30/2021-majority of liver  lesions are unchanged in size, 1 lesion adjacent to the gallbladder has increased, no adenopathy, persistent pericolonic stranding and peritoneal thickening at the cecum ?Progressive rise in CEA ?08/31/2021-treatment changed to Xeloda/bevacizumab, began [place] 09/22/2021 ?CEA stable 11/03/2021 ?Xeloda/bevacizumab continued ?Anemia secondary to GI blood loss, on oral iron BID  ?Cirrhosis ?12/26/2019-hospital admission for right lower extremity DVT and GI bleed, treated with heparin anticoagulation beginning 12/26/2019, converted to Lovenox at discharge 12/29/2019 ?Lovenox reduced to 40 mg daily 02/10/2021 secondary to bruising and nosebleeding ?History of low-grade fever-likely "tumor" fever ?COVID-19 infection January 2021 ?  ?  ? ? ? ? ?Disposition: ?*** ? ?[name], MD ? ?12/22/2021  ?9:35 AM ? ? ?

## 2021-12-22 NOTE — Patient Instructions (Signed)
Heparin injection ?What is this medication? ?HEPARIN (HEP a rin) is an anticoagulant. It is used to treat or prevent clots in the veins, arteries, lungs, or heart. It stops clots from forming or getting bigger. This medicine prevents clotting during open-heart surgery, dialysis, or in patients who are confined to bed. ?This medicine may be used for other purposes; ask your health care provider or pharmacist if you have questions. ?COMMON BRAND NAME(S): Hep-Lock, Hep-Lock U/P, Hepflush-10, Monoject Prefill Advanced Heparin Lock Flush, SASH Normal Saline and Heparin ?What should I tell my care team before I take this medication? ?They need to know if you have any of these conditions: ?bleeding disorders, such as hemophilia or low blood platelets ?bowel disease or diverticulitis ?endocarditis ?high blood pressure ?liver disease ?recent surgery or delivery of a baby ?stomach ulcers ?an unusual or allergic reaction to heparin, benzyl alcohol, sulfites, other medicines, foods, dyes, or preservatives ?pregnant or trying to get pregnant ?breast-feeding ?How should I use this medication? ?This medicine is given by injection or infusion into a vein. It can also be given by injection of small amounts under the skin. It is usually given by a health care professional in a hospital or clinic setting. ?If you get this medicine at home, you will be taught how to prepare and give this medicine. Use exactly as directed. Take your medicine at regular intervals. Do not take it more often than directed. Do not stop taking except on your doctor's advice. Stopping this medicine may increase your risk of a blot clot. Be sure to refill your prescription before you run out of medicine. ?It is important that you put your used needles and syringes in a special sharps container. Do not put them in a trash can. If you do not have a sharps container, call your pharmacist or healthcare provider to get one. ?Talk to your pediatrician regarding the  use of this medicine in children. While this medicine may be prescribed for children for selected conditions, precautions do apply. ?Overdosage: If you think you have taken too much of this medicine contact a poison control center or emergency room at once. ?NOTE: This medicine is only for you. Do not share this medicine with others. ?What if I miss a dose? ?If you miss a dose, take it as soon as you can. If it is almost time for your next dose, take only that dose. Do not take double or extra doses. ?What may interact with this medication? ?Do not take this medicine with any of the following medications: ?aspirin and aspirin-like drugs ?mifepristone ?medicines that treat or prevent blood clots like warfarin, enoxaparin, and dalteparin ?palifermin ?protamine ?This medicine may also interact with the following medications: ?dextran ?digoxin ?hydroxychloroquine ?medicines for treating colds or allergies ?nicotine ?NSAIDs, medicines for pain and inflammation, like ibuprofen or naproxen ?phenylbutazone ?tetracycline antibiotics ?This list may not describe all possible interactions. Give your health care provider a list of all the medicines, herbs, non-prescription drugs, or dietary supplements you use. Also tell them if you smoke, drink alcohol, or use illegal drugs. Some items may interact with your medicine. ?What should I watch for while using this medication? ?Visit your healthcare professional for regular checks on your progress. You may need blood work done while you are taking this medicine. Your condition will be monitored carefully while you are receiving this medicine. It is important not to miss any appointments. ?Wear a medical ID bracelet or chain, and carry a card that describes your disease and details   of your medicine and dosage times. ?Notify your doctor or healthcare professional at once if you have cold, blue hands or feet. ?If you are going to need surgery or other procedure, tell your healthcare  professional that you are using this medicine. ?Avoid sports and activities that might cause injury while you are using this medicine. Severe falls or injuries can cause unseen bleeding. Be careful when using sharp tools or knives. Consider using an electric razor. Take special care brushing or flossing your teeth. Report any injuries, bruising, or red spots on the skin to your healthcare professional. ?Using this medicine for a long time may weaken your bones and increase the risk of bone fractures. ?You should make sure that you get enough calcium and vitamin D while you are taking this medicine. Discuss the foods you eat and the vitamins you take with your healthcare professional. ?Wear a medical ID bracelet or chain. Carry a card that describes your disease and details of your medicine and dosage times. ?What side effects may I notice from receiving this medication? ?Side effects that you should report to your doctor or health care professional as soon as possible: ?allergic reactions like skin rash, itching or hives, swelling of the face, lips, or tongue ?bone pain ?fever, chills ?nausea, vomiting ?signs and symptoms of bleeding such as bloody or black, tarry stools; red or dark-Cogle urine; spitting up blood or Helm material that looks like coffee grounds; red spots on the skin; unusual bruising or bleeding from the eye, gums, or nose ?signs and symptoms of a blood clot such as chest pain; shortness of breath; pain, swelling, or warmth in the leg ?signs and symptoms of a stroke such as changes in vision; confusion; trouble speaking or understanding; severe headaches; sudden numbness or weakness of the face, arm or leg; trouble walking; dizziness; loss of coordination ?Side effects that usually do not require medical attention (report to your doctor or health care professional if they continue or are bothersome): ?hair loss ?pain, redness, or irritation at site where injected ?This list may not describe all  possible side effects. Call your doctor for medical advice about side effects. You may report side effects to FDA at 1-800-FDA-1088. ?Where should I keep my medication? ?Keep out of the reach of children. ?Store unopened vials at room temperature between 15 and 30 degrees C (59 and 86 degrees F). Do not freeze. Do not use if solution is discolored or particulate matter is present. Throw away any unused medicine after the expiration date. ?NOTE: This sheet is a summary. It may not cover all possible information. If you have questions about this medicine, talk to your doctor, pharmacist, or health care provider. ?? 2023 Elsevier/Gold Standard (2020-09-09 00:00:00) ? ?

## 2021-12-28 DIAGNOSIS — C787 Secondary malignant neoplasm of liver and intrahepatic bile duct: Secondary | ICD-10-CM | POA: Diagnosis not present

## 2021-12-28 DIAGNOSIS — Z86718 Personal history of other venous thrombosis and embolism: Secondary | ICD-10-CM | POA: Diagnosis not present

## 2021-12-28 DIAGNOSIS — K7460 Unspecified cirrhosis of liver: Secondary | ICD-10-CM | POA: Diagnosis not present

## 2021-12-28 DIAGNOSIS — Z452 Encounter for adjustment and management of vascular access device: Secondary | ICD-10-CM | POA: Diagnosis not present

## 2021-12-28 DIAGNOSIS — Z7901 Long term (current) use of anticoagulants: Secondary | ICD-10-CM | POA: Diagnosis not present

## 2021-12-28 DIAGNOSIS — D500 Iron deficiency anemia secondary to blood loss (chronic): Secondary | ICD-10-CM | POA: Diagnosis not present

## 2021-12-28 DIAGNOSIS — Z79899 Other long term (current) drug therapy: Secondary | ICD-10-CM | POA: Diagnosis not present

## 2021-12-28 DIAGNOSIS — C182 Malignant neoplasm of ascending colon: Secondary | ICD-10-CM | POA: Diagnosis not present

## 2021-12-29 ENCOUNTER — Other Ambulatory Visit (HOSPITAL_BASED_OUTPATIENT_CLINIC_OR_DEPARTMENT_OTHER): Payer: Self-pay

## 2021-12-29 DIAGNOSIS — C182 Malignant neoplasm of ascending colon: Secondary | ICD-10-CM

## 2021-12-30 LAB — PROTEIN, URINE, 24 HOUR
Collection Interval-UPROT: 24 hours
Protein, 24H Urine: 935 mg/d — ABNORMAL HIGH (ref 50–100)
Protein, Urine: 55 mg/dL
Urine Total Volume-UPROT: 1700 mL

## 2022-01-08 ENCOUNTER — Other Ambulatory Visit: Payer: Self-pay | Admitting: Oncology

## 2022-01-10 ENCOUNTER — Inpatient Hospital Stay: Payer: BC Managed Care – PPO

## 2022-01-10 ENCOUNTER — Ambulatory Visit (HOSPITAL_BASED_OUTPATIENT_CLINIC_OR_DEPARTMENT_OTHER)
Admission: RE | Admit: 2022-01-10 | Discharge: 2022-01-10 | Disposition: A | Discharge status: Home / Self Care | Payer: BC Managed Care – PPO | Source: Ambulatory Visit | Attending: Oncology | Admitting: Oncology

## 2022-01-10 DIAGNOSIS — Z7901 Long term (current) use of anticoagulants: Secondary | ICD-10-CM | POA: Diagnosis not present

## 2022-01-10 DIAGNOSIS — K7460 Unspecified cirrhosis of liver: Secondary | ICD-10-CM | POA: Diagnosis not present

## 2022-01-10 DIAGNOSIS — D500 Iron deficiency anemia secondary to blood loss (chronic): Secondary | ICD-10-CM | POA: Diagnosis not present

## 2022-01-10 DIAGNOSIS — C182 Malignant neoplasm of ascending colon: Secondary | ICD-10-CM | POA: Insufficient documentation

## 2022-01-10 DIAGNOSIS — C229 Malignant neoplasm of liver, not specified as primary or secondary: Secondary | ICD-10-CM | POA: Diagnosis not present

## 2022-01-10 DIAGNOSIS — C801 Malignant (primary) neoplasm, unspecified: Secondary | ICD-10-CM | POA: Diagnosis not present

## 2022-01-10 DIAGNOSIS — Z452 Encounter for adjustment and management of vascular access device: Secondary | ICD-10-CM | POA: Diagnosis not present

## 2022-01-10 DIAGNOSIS — C787 Secondary malignant neoplasm of liver and intrahepatic bile duct: Secondary | ICD-10-CM | POA: Diagnosis not present

## 2022-01-10 DIAGNOSIS — Z86718 Personal history of other venous thrombosis and embolism: Secondary | ICD-10-CM | POA: Diagnosis not present

## 2022-01-10 DIAGNOSIS — Z79899 Other long term (current) drug therapy: Secondary | ICD-10-CM | POA: Diagnosis not present

## 2022-01-10 LAB — TOTAL PROTEIN, URINE DIPSTICK: Protein, ur: >300 mg/dL — AB

## 2022-01-10 LAB — CMP (CANCER CENTER ONLY)
ALT: 18 U/L (ref 0–44)
AST: 26 U/L (ref 15–41)
Albumin: 3.6 g/dL (ref 3.5–5.0)
Alkaline Phosphatase: 131 U/L — ABNORMAL HIGH (ref 38–126)
Anion gap: 8 (ref 5–15)
BUN: 21 mg/dL (ref 8–23)
CO2: 26 mmol/L (ref 22–32)
Calcium: 8.9 mg/dL (ref 8.9–10.3)
Chloride: 105 mmol/L (ref 98–111)
Creatinine: 0.75 mg/dL (ref 0.44–1.00)
GFR, Estimated: >60 mL/min (ref >60)
Glucose, Bld: 85 mg/dL (ref 70–99)
Potassium: 4.2 mmol/L (ref 3.5–5.1)
Sodium: 139 mmol/L (ref 135–145)
Total Bilirubin: 1.1 mg/dL (ref 0.3–1.2)
Total Protein: 6.3 g/dL — ABNORMAL LOW (ref 6.5–8.1)

## 2022-01-10 LAB — CBC WITH DIFFERENTIAL (CANCER CENTER ONLY)
Abs Immature Granulocytes: 0.01 10*3/uL (ref 0.00–0.07)
Basophils Absolute: 0 10*3/uL (ref 0.0–0.1)
Basophils Relative: 1 %
Eosinophils Absolute: 0.1 10*3/uL (ref 0.0–0.5)
Eosinophils Relative: 2 %
HCT: 32.3 % — ABNORMAL LOW (ref 36.0–46.0)
Hemoglobin: 10.7 g/dL — ABNORMAL LOW (ref 12.0–15.0)
Immature Granulocytes: 0 %
Lymphocytes Relative: 19 %
Lymphs Abs: 0.8 10*3/uL (ref 0.7–4.0)
MCH: 36.9 pg — ABNORMAL HIGH (ref 26.0–34.0)
MCHC: 33.1 g/dL (ref 30.0–36.0)
MCV: 111.4 fL — ABNORMAL HIGH (ref 80.0–100.0)
Monocytes Absolute: 0.5 10*3/uL (ref 0.1–1.0)
Monocytes Relative: 13 %
Neutro Abs: 2.8 10*3/uL (ref 1.7–7.7)
Neutrophils Relative %: 65 %
Platelet Count: 118 10*3/uL — ABNORMAL LOW (ref 150–400)
RBC: 2.9 MIL/uL — ABNORMAL LOW (ref 3.87–5.11)
RDW: 16.1 % — ABNORMAL HIGH (ref 11.5–15.5)
WBC Count: 4.3 10*3/uL (ref 4.0–10.5)
nRBC: 0 % (ref 0.0–0.2)

## 2022-01-10 LAB — CEA (ACCESS): CEA (CHCC): 174.22 ng/mL — ABNORMAL HIGH (ref 0.00–5.00)

## 2022-01-10 MED ORDER — HEPARIN SOD (PORK) LOCK FLUSH 100 UNIT/ML IV SOLN
500.0000 [IU] | Freq: Once | INTRAVENOUS | Status: AC
  Administered 2022-01-10: 500 [IU] via INTRAVENOUS

## 2022-01-10 MED ORDER — IOHEXOL 300 MG/ML  SOLN
75.0000 mL | Freq: Once | INTRAMUSCULAR | Status: AC | PRN
  Administered 2022-01-10: 75 mL via INTRAVENOUS

## 2022-01-10 NOTE — Patient Instructions (Signed)

## 2022-01-12 ENCOUNTER — Inpatient Hospital Stay: Payer: BC Managed Care – PPO

## 2022-01-12 ENCOUNTER — Other Ambulatory Visit: Payer: Self-pay | Admitting: *Deleted

## 2022-01-12 ENCOUNTER — Inpatient Hospital Stay: Payer: BC Managed Care – PPO | Attending: Oncology | Admitting: Oncology

## 2022-01-12 ENCOUNTER — Other Ambulatory Visit (HOSPITAL_COMMUNITY): Payer: Self-pay

## 2022-01-12 VITALS — BP 143/58 | HR 65 | Temp 98.2°F | Resp 18 | Ht 67.0 in | Wt 134.0 lb

## 2022-01-12 DIAGNOSIS — K7460 Unspecified cirrhosis of liver: Secondary | ICD-10-CM | POA: Insufficient documentation

## 2022-01-12 DIAGNOSIS — D500 Iron deficiency anemia secondary to blood loss (chronic): Secondary | ICD-10-CM | POA: Insufficient documentation

## 2022-01-12 DIAGNOSIS — Z86718 Personal history of other venous thrombosis and embolism: Secondary | ICD-10-CM | POA: Insufficient documentation

## 2022-01-12 DIAGNOSIS — C787 Secondary malignant neoplasm of liver and intrahepatic bile duct: Secondary | ICD-10-CM | POA: Diagnosis not present

## 2022-01-12 DIAGNOSIS — Z7901 Long term (current) use of anticoagulants: Secondary | ICD-10-CM | POA: Diagnosis not present

## 2022-01-12 DIAGNOSIS — C182 Malignant neoplasm of ascending colon: Secondary | ICD-10-CM | POA: Diagnosis not present

## 2022-01-12 DIAGNOSIS — Z5112 Encounter for antineoplastic immunotherapy: Secondary | ICD-10-CM | POA: Diagnosis not present

## 2022-01-12 MED ORDER — ENCORAFENIB 75 MG PO CAPS
300.0000 mg | ORAL_CAPSULE | Freq: Every day | ORAL | 0 refills | Status: DC
  Filled 2022-01-12: qty 180, 45d supply, fill #0

## 2022-01-12 MED ORDER — DOXYCYCLINE HYCLATE 100 MG PO TABS: 100.0000 mg | ORAL_TABLET | Freq: Two times a day (BID) | ORAL | 2 refills | Status: DC

## 2022-01-12 NOTE — Progress Notes (Signed)
[facility] OFFICE PROGRESS NOTE   Diagnosis: Colon cancer  INTERVAL HISTORY:   Stacy Burns returns as scheduled.  She continues treatment with Xeloda and Avastin.  She has intermittent abdominal pain.  She continues to have erythema at the hands and feet.  She is working.  Objective:  Vital signs in last 24 hours:  Blood pressure (!) 143/58, pulse 65, temperature 98.2 F (36.8 C), temperature source Oral, resp. rate 18, height $RemoveBe'5\' 7"'rFEPEHxRt$  (1.702 m), weight 134 lb (60.8 kg), SpO2 100 %.    HEENT: Mild erythema at the distal tongue, no ulcers, no thrush Resp: Lungs clear bilaterally Cardio: Regular rate and rhythm GI: No hepatosplenomegaly, fullness in the right mid abdomen without a discrete mass, mild tenderness Vascular: Trace pitting edema at the lower leg and foot bilaterally  Skin: Mild erythema and skin thickening of the palms, mild erythema and superficial desquamation at the soles  Portacath/PICC-without erythema  Lab Results:  Lab Results  Component Value Date   WBC 4.3 01/10/2022   HGB 10.7 (L) 01/10/2022   HCT 32.3 (L) 01/10/2022   MCV 111.4 (H) 01/10/2022   PLT 118 (L) 01/10/2022   NEUTROABS 2.8 01/10/2022    CMP  Lab Results  Component Value Date   NA 139 01/10/2022   K 4.2 01/10/2022   CL 105 01/10/2022   CO2 26 01/10/2022   GLUCOSE 85 01/10/2022   BUN 21 01/10/2022   CREATININE 0.75 01/10/2022   CALCIUM 8.9 01/10/2022   PROT 6.3 (L) 01/10/2022   ALBUMIN 3.6 01/10/2022   AST 26 01/10/2022   ALT 18 01/10/2022   ALKPHOS 131 (H) 01/10/2022   BILITOT 1.1 01/10/2022   GFRNONAA >60 01/10/2022   GFRAA >60 05/06/2020    Lab Results  Component Value Date   CEA1 54.57 (H) 12/30/2020   CEA 174.22 (H) 01/10/2022    Lab Results  Component Value Date   INR 1.1 12/26/2019   LABPROT 13.6 12/26/2019    Imaging:  CT CHEST ABDOMEN PELVIS W CONTRAST  Result Date: 01/10/2022 CLINICAL DATA:  Colon cancer restaging. * Tracking Code: BO *  EXAM: CT CHEST, ABDOMEN, AND PELVIS WITH CONTRAST TECHNIQUE: Multidetector CT imaging of the chest, abdomen and pelvis was performed following the standard protocol during bolus administration of intravenous contrast. RADIATION DOSE REDUCTION: This exam was performed according to the departmental dose-optimization program which includes automated exposure control, adjustment of the mA and/or kV according to patient size and/or use of iterative reconstruction technique. CONTRAST:  38mL OMNIPAQUE IOHEXOL 300 MG/ML  SOLN COMPARISON:  08/30/2021 CT chest, abdomen and pelvis. FINDINGS: CT CHEST FINDINGS Cardiovascular: Normal heart size. No significant pericardial effusion/thickening. Three-vessel coronary atherosclerosis. Right internal jugular Port-A-Cath terminates in middle third of the SVC. Atherosclerotic nonaneurysmal thoracic aorta. Normal caliber pulmonary arteries. No central pulmonary emboli. Mediastinum/Nodes: No discrete thyroid nodules. Unremarkable esophagus. No pathologically enlarged axillary, mediastinal or hilar lymph nodes. Lungs/Pleura: No pneumothorax. No pleural effusion. Symmetric patchy mild subpleural biapical pleural-parenchymal scarring, unchanged. No acute consolidative airspace disease or lung masses. Solid 0.3 cm right lower lobe pulmonary nodule (series 4/image 102), new. No additional new significant pulmonary nodules. A few scattered calcified pulmonary nodules in both lungs, largest 0.8 cm in the posterior left lower lobe on series 4/image 112), all unchanged. Musculoskeletal:  No aggressive appearing focal osseous lesions. CT ABDOMEN PELVIS FINDINGS Hepatobiliary: Several hypodense liver masses scattered throughout the liver, at least 2 of which have increased in size, and several which are stable, with  representative liver lesions as follows: -segment 4B left liver 1.9 x 1.8 cm mass (series 2/image 72), increased from 1.4 x 1.2 cm on 08/30/2021 CT -peripheral segment 8 right liver 1.8  x 1.7 cm mass (series 2/image 62), increased from 1.4 x 1.3 cm -segment 8 right liver dome mixed density 1.5 cm lesion (series 2/image 56), previously 1.6 cm, not appreciably changed -segment 3 left liver 1.7 cm mass (series 2/image 72), previously 1.7 cm, stable Mildly distended gallbladder. No definite gallbladder wall thickening. No radiopaque cholelithiasis. No biliary ductal dilatation. Pancreas: Normal, with no mass or duct dilation. Spleen: Normal size. No mass. Adrenals/Urinary Tract: Normal adrenals. Small simple left parapelvic renal cysts are unchanged, for which no follow-up is recommended. Otherwise normal kidneys, with no hydronephrosis. Normal bladder. Stomach/Bowel: Normal non-distended stomach. Several mildly dilated pelvic small bowel loops measuring up to 3.8 cm diameter, increased from prior CT. Dilated cecum (7.9 cm diameter), unchanged. Mild irregular annular wall thickening at the terminal ileum and ileocecal valve, increased. Focal caliber transition in the proximal ascending colon with collapsed lumen in the distal ascending colon, although oral contrast progresses to the left colon, compatible with a chronic stricture. No additional sites of large bowel wall thickening. No diverticulosis. Vascular/Lymphatic: Atherosclerotic nonaneurysmal abdominal aorta. Patent portal, splenic, hepatic and renal veins. No pathologically enlarged lymph nodes in the abdomen or pelvis. Reproductive: Grossly normal uterus.  No adnexal mass. Other: No pneumoperitoneum. Small volume ascites is slightly increased. No focal fluid collection. No discrete peritoneal nodularity. Stable bilateral paracolic gutter peritoneal thickening, right greater than left. Musculoskeletal: No aggressive appearing focal osseous lesions. Mild-to-moderate lumbar spondylosis. IMPRESSION: 1. Two of the liver metastases have increased in size since 08/30/2021 CT, as detailed. Other liver metastases are stable. 2. New tiny 0.3 cm right  lower lobe solid pulmonary nodule, cannot exclude new pulmonary metastasis. 3. Increased dilatation of the distal small bowel. Increased annular wall thickening at the terminal ileum and ileocecal valve, cannot exclude locally recurrent neoplasm. 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 Oral contrast does transit to the left colon. Findings raise concern for developing large-bowel obstruction at the level of the ascending colon/ileocecal valve. 4. Small volume ascites, slightly increased. Stable bilateral paracolic gutter peritoneal thickening, right greater than left. 5. Chronic findings include: Three-vessel coronary atherosclerosis. Aortic Atherosclerosis (ICD10-I70.0). These results will be called to the ordering clinician or representative by the Radiologist Assistant, and communication documented in the PACS or [employer]. Electronically Signed   By: Ilona Sorrel M.D.   On: 01/10/2022 17:02    Medications: I have reviewed the patient's current medications.   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  Cirrhosis.  Innumerable liver lesions. Colonoscopy 12/18/2019-ulcerated partially obstructing large mass in the mid ascending colon.  Biopsy invasive adenocarcinoma; preserved expression major MMR proteins Upper endoscopy 12/18/2019-normal esophagus, stomach, examined duodenum CEA 12/19/2019-8,923 Biopsy liver lesion 12/23/2019-adenocarcinoma Foundation 1-K-ras wild-type; tumor mutation burden greater than or equal to 10; microsatellite stable; BRAF V600E Cycle 1 FOLFOX 01/01/2020 Cycle 2 FOLFOX 01/15/2020  Cycle 3 FOLFOX 01/29/2020, Udenyca added Cycle 4 FOLFOX 02/12/2020, Udenyca held Cycle 5 FOLFOX 02/26/2020, Udenyca Cycle 6 FOLFOX 03/11/2020, Udenyca held CT abdomen/pelvis 03/16/2020-stable diffuse liver lesions, stable strictured appearing ascending colon,  possible pericolonic implant, vague left lower lobe nodule Cycle 1 FOLFIRI/Avastin 04/06/2020 Cycle 2 FOLFIRI/Avastin 04/21/2020 Cycle 3 FOLFIRI/Avastin 05/06/2020 Cycle 4 FOLFIRI/Avastin 05/20/2020 Cycle 5 FOLFIRI/Avastin 06/03/2020 CTs 06/15/2020-mild to  moderate improvement in hepatic metastasis.  No new or progressive disease.  Narrowing within the proximal ascending colon with upstream mild cecal and terminal ileum dilatation, similar. Cycle 6 FOLFIRI/Avastin 06/17/2020 Cycle 7 FOLFIRI/Avastin 07/01/2020 Cycle 8 FOLFIRI/Avastin 07/15/2020 Cycle 9 FOLFIRI/Avastin 07/29/2020-5-FU bolus eliminated, 5-FU infusion and irinotecan dose reduced Cycle 10 FOLFIRI/Avastin 08/12/2020 CTs 08/23/2020- mild residual wall thickening involving the cecum/ascending colon.  Numerous liver metastases improved. Cycle 11 FOLFIRI/Avastin 08/25/2020 Cycle 12 FOLFIRI/Avastin 09/16/2020 Cycle 13 FOLFIRI/Avastin 10/07/2020 Cycle 14 FOLFIRI/Avastin 10/28/2020 Cycle 15 FOLFIRI/Avastin 11/18/2020 CT abdomen/pelvis 12/06/2020-mild improvement in multifocal hepatic metastases, wall thickening at the ascending colon with pericolonic inflammatory changes Cycle 16 FOLFIRI/Avastin 12/09/2020 Cycle 17 FOLFIRI/Avastin 12/30/2020 Cycle 18 FOLFIRI/Avastin 01/20/2021 Cycle 19 FOLFIRI/Avastin 02/10/2021 Cycle 20 FOLFIRI/Avastin 03/03/2021 Cycle 21 FOLFIRI/Avastin 03/24/2021 Cycle 22 FOLFIRI/Avastin 04/14/2021 CTs 05/04/2021-slight improvement in hepatic metastatic disease. Cycle 23 FOLFIRI/Avastin 05/05/2021 Cycle 24 FOLFIRI/Avastin 05/25/2021 Cycle 25 FOLFIRI/Avastin 06/15/2021 Cycle 26 FOLFIRI/Avastin 07/13/2021 Cycle 27 FOLFIRI/Avastin 08/02/2021 CTs 08/30/2021-majority of liver lesions are unchanged in size, 1 lesion adjacent to the gallbladder has increased, no adenopathy, persistent pericolonic stranding and peritoneal thickening at the cecum Progressive rise in CEA 08/31/2021-treatment changed to Xeloda/bevacizumab, began [place] 09/22/2021 CEA  stable 11/03/2021 Xeloda/bevacizumab continued CTs 01/10/2022-increase in size of some of the liver lesions, others are stable, new tiny right lower lobe nodule, increased dilation of distal small bowel with increased wall thickening at the terminal ileum and ileocecal valve with 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, stable bilateral paracolic gutter peritoneal thickening Anemia secondary to GI blood loss, on oral iron BID  Cirrhosis 12/26/2019-hospital admission for right lower extremity DVT and GI bleed, treated with heparin anticoagulation beginning 12/26/2019, converted to Lovenox at discharge 12/29/2019 Lovenox reduced to 40 mg daily 02/10/2021 secondary to bruising and nosebleeding History of low-grade fever-likely "tumor" fever COVID-19 infection January 2021       Disposition: Stacy Burns has metastatic colon cancer.  She has been maintained on Xeloda/Avastin since February.  The CEA is higher.  The restaging CTs are consistent with disease progression in the liver and the primary right colon tumor.  I discussed the CT findings and images with Ms. Owens Shark.  We discussed treatment options.  Verdene Rio will be discontinued.  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  I recommend targeted therapy with encorafenib and panitumumab.  We reviewed potential toxicities associated with this regimen including the chance of nausea, mucositis, diarrhea, rash, and hematologic toxicity.  We discussed the dryness, paronychia, magnesium wasting, and acne type rash associated with panitumumab.  She agrees to proceed.  She will be placed on doxycycline prophylaxis.  The plan is to begin treatment with encorafenib/panitumumab on 01/19/2022.  [name], MD  01/12/2022  9:07 AM

## 2022-01-13 ENCOUNTER — Other Ambulatory Visit (HOSPITAL_COMMUNITY): Payer: Self-pay

## 2022-01-13 ENCOUNTER — Telehealth: Payer: Self-pay | Admitting: Pharmacy Technician

## 2022-01-13 ENCOUNTER — Telehealth: Payer: Self-pay | Admitting: Pharmacist

## 2022-01-13 DIAGNOSIS — C182 Malignant neoplasm of ascending colon: Secondary | ICD-10-CM

## 2022-01-13 NOTE — Telephone Encounter (Signed)
Oral Oncology Pharmacist Encounter  Received new prescription for Braftovi (encorafenib) for the treatment of metastatic colon cancer in conjunction with panitumumab, planned duration until disease progression or unacceptable drug toxicity. Planned start 01/19/22  CMP from 01/10/22 assessed, no relevant lab abnormalities. Prescription dose and frequency assessed.   Current medication list in Epic reviewed, a few DDIs with encorafenib identified: Prochlorperazine and ondansetron: Encorafenib is associated with dose-dependent QTc interval prolongation. Avoid coadministration with other medicinal products known to prolong the QT/QTc interval. If use cannot be avoided, monitor patients for QTc interval prolongation.  Evaluated chart and no patient barriers to medication adherence identified.   Prescription has been e-scribed to the [facility] for benefits analysis and approval.  Oral Oncology Clinic will continue to follow for insurance authorization, copayment issues, initial counseling and start date.  Patient agreed to treatment on 01/12/22 per MD documentation.  Darl Pikes, PharmD, BCPS, BCOP, CPP Hematology/Oncology Clinical Pharmacist Practitioner [place]/DB/AP [facility] [phone]  01/13/2022 9:28 AM

## 2022-01-13 NOTE — Telephone Encounter (Signed)
Oral Oncology Patient Advocate Encounter   Received notification from [employer] that prior authorization for Braftovi is required.   PA submitted on CoverMyMeds Key [id number]  Status is pending   Oral Oncology Clinic will continue to follow.  [place] Patient [place] Phone [phone] Fax [phone] 01/13/2022 8:54 AM

## 2022-01-13 NOTE — Telephone Encounter (Signed)
Oral Oncology Patient Advocate Encounter  Prior Authorization for Stacy Burns has been approved.    PA# [id number] Effective dates: 12/14/21 through 01/13/23  Patient must fill through [facility].  Oral Oncology Clinic will continue to follow up for copay and delivery status.  [place] Patient [place] Phone [phone] Fax [phone] 01/13/2022 8:57 AM

## 2022-01-14 ENCOUNTER — Other Ambulatory Visit: Payer: Self-pay | Admitting: Oncology

## 2022-01-16 ENCOUNTER — Encounter: Payer: Self-pay | Admitting: *Deleted

## 2022-01-16 ENCOUNTER — Encounter: Payer: Self-pay | Admitting: Pharmacist

## 2022-01-16 MED ORDER — ENCORAFENIB 75 MG PO CAPS: 300.0000 mg | ORAL_CAPSULE | Freq: Every day | ORAL | 0 refills | Status: DC

## 2022-01-16 NOTE — Progress Notes (Signed)
Per Dr. Benay Spice: OK to treat on 6/8 with CBC,CMP results of 01/10/22.

## 2022-01-16 NOTE — Telephone Encounter (Signed)
Oral Chemotherapy Pharmacist Encounter  Patient's prescription had to be sent out to [facility]. Patient is aware, she will call [facility] on Wednesday if she has not heard from them by then.  Patient Education I spoke with patient for overview of new oral chemotherapy medication: Braftovi (encorafenib) for the treatment of metastatic colon cancer in conjunction with panitumumab, planned duration until disease progression or unacceptable drug toxicity. Planned start 01/19/22.   Counseled patient on administration, dosing, side effects, monitoring, drug-food interactions, safe handling, storage, and disposal. Patient will take 4 capsules (300 mg total) by mouth daily.  Side effects include but not limited to: acne-like rash, diarrhea, fatigue, nausea.    Reviewed with patient importance of keeping a medication schedule and plan for any missed doses.  After discussion with patient no patient barriers to medication adherence identified.   Ms. Gao voiced understanding and appreciation. All questions answered. Medication handout provided.  Provided patient with [facility] phone number. Patient knows to call the office with questions or concerns. Oral Chemotherapy Navigation Clinic will continue to follow.  Darl Pikes, PharmD, BCPS, BCOP, CPP Hematology/Oncology Clinical Pharmacist Practitioner [place]/DB/AP [facility] [phone]  01/16/2022 2:50 PM

## 2022-01-18 ENCOUNTER — Other Ambulatory Visit: Payer: Self-pay | Admitting: Oncology

## 2022-01-19 ENCOUNTER — Other Ambulatory Visit: Payer: BC Managed Care – PPO

## 2022-01-19 ENCOUNTER — Inpatient Hospital Stay: Payer: BC Managed Care – PPO

## 2022-01-19 VITALS — BP 150/63 | HR 61 | Temp 98.5°F | Resp 16 | Ht 67.0 in | Wt 133.0 lb

## 2022-01-19 DIAGNOSIS — C182 Malignant neoplasm of ascending colon: Secondary | ICD-10-CM | POA: Diagnosis not present

## 2022-01-19 DIAGNOSIS — K7460 Unspecified cirrhosis of liver: Secondary | ICD-10-CM | POA: Diagnosis not present

## 2022-01-19 DIAGNOSIS — Z86718 Personal history of other venous thrombosis and embolism: Secondary | ICD-10-CM | POA: Diagnosis not present

## 2022-01-19 DIAGNOSIS — C787 Secondary malignant neoplasm of liver and intrahepatic bile duct: Secondary | ICD-10-CM | POA: Diagnosis not present

## 2022-01-19 DIAGNOSIS — D500 Iron deficiency anemia secondary to blood loss (chronic): Secondary | ICD-10-CM | POA: Diagnosis not present

## 2022-01-19 DIAGNOSIS — Z7901 Long term (current) use of anticoagulants: Secondary | ICD-10-CM | POA: Diagnosis not present

## 2022-01-19 DIAGNOSIS — Z5112 Encounter for antineoplastic immunotherapy: Secondary | ICD-10-CM | POA: Diagnosis not present

## 2022-01-19 MED ORDER — SODIUM CHLORIDE 0.9% FLUSH
10.0000 mL | INTRAVENOUS | Status: DC | PRN
  Administered 2022-01-19: 10 mL

## 2022-01-19 MED ORDER — SODIUM CHLORIDE 0.9 % IV SOLN
6.0000 mg/kg | Freq: Once | INTRAVENOUS | Status: AC
  Administered 2022-01-19: 400 mg via INTRAVENOUS
  Filled 2022-01-19: qty 20

## 2022-01-19 MED ORDER — HEPARIN SOD (PORK) LOCK FLUSH 100 UNIT/ML IV SOLN
500.0000 [IU] | Freq: Once | INTRAVENOUS | Status: AC | PRN
  Administered 2022-01-19: 500 [IU]

## 2022-01-19 MED ORDER — SODIUM CHLORIDE 0.9 % IV SOLN: Freq: Once | INTRAVENOUS | Status: AC

## 2022-01-19 NOTE — Patient Instructions (Signed)
Stacy Burns   Discharge Instructions: Thank you for choosing [facility] to provide your oncology and hematology care.   If you have a lab appointment with the [facility], please go directly to the [place] and check in at the registration area.   Wear comfortable clothing and clothing appropriate for easy access to any Portacath or PICC line.   We strive to give you quality time with your provider. You may need to reschedule your appointment if you arrive late (15 or more minutes).  Arriving late affects you and other patients whose appointments are after yours.  Also, if you miss three or more appointments without notifying the office, you may be dismissed from the clinic at the provider's discretion.      For prescription refill requests, have your pharmacy contact our office and allow 72 hours for refills to be completed.    Today you received the following chemotherapy and/or immunotherapy agents Panitumumab      To help prevent nausea and vomiting after your treatment, we encourage you to take your nausea medication as directed.  BELOW ARE SYMPTOMS THAT SHOULD BE REPORTED IMMEDIATELY: *FEVER GREATER THAN 100.4 F (38 C) OR HIGHER *CHILLS OR SWEATING *NAUSEA AND VOMITING THAT IS NOT CONTROLLED WITH YOUR NAUSEA MEDICATION *UNUSUAL SHORTNESS OF BREATH *UNUSUAL BRUISING OR BLEEDING *URINARY PROBLEMS (pain or burning when urinating, or frequent urination) *BOWEL PROBLEMS (unusual diarrhea, constipation, pain near the anus) TENDERNESS IN MOUTH AND THROAT WITH OR WITHOUT PRESENCE OF ULCERS (sore throat, sores in mouth, or a toothache) UNUSUAL RASH, SWELLING OR PAIN  UNUSUAL VAGINAL DISCHARGE OR ITCHING   Items with * indicate a potential emergency and should be followed up as soon as possible or go to the Emergency Department if any problems should occur.  Please show the CHEMOTHERAPY ALERT CARD or IMMUNOTHERAPY ALERT CARD at check-in to  the Emergency Department and triage nurse.  Should you have questions after your visit or need to cancel or reschedule your appointment, please contact [facility]  Dept: [phone]  and follow the prompts.  Office hours are 8:00 a.m. to 4:30 p.m. Monday - Friday. Please note that voicemails left after 4:00 p.m. may not be returned until the following business day.  We are closed weekends and major holidays. You have access to a nurse at all times for urgent questions. Please call the main number to the clinic Dept: [phone] and follow the prompts.   For any non-urgent questions, you may also contact your provider using MyChart. We now offer e-Visits for anyone 40 and older to request care online for non-urgent symptoms. For details visit [email or web address].   Also download the MyChart app! Go to the app store, search "MyChart", open the app, select [facility], and log in with your MyChart username and password.  Due to Covid, a mask is required upon entering the hospital/clinic. If you do not have a mask, one will be given to you upon arrival. For doctor visits, patients may have 1 support person aged 31 or older with them. For treatment visits, patients cannot have anyone with them due to current Covid guidelines and our immunocompromised population.   Panitumumab Solution for Injection What is this medication? PANITUMUMAB (pan i TOOM ue mab) is a monoclonal antibody. It is used to treat colorectal cancer. This medicine may be used for other purposes; ask your health care provider or pharmacist if you have questions. COMMON BRAND NAME(S): Vectibix  What should I tell my care team before I take this medication? They need to know if you have any of these conditions: eye disease, vision problems low levels of calcium, magnesium, or potassium in the blood lung or breathing disease, like asthma skin conditions or sensitivity an unusual or allergic reaction to  panitumumab, other medicines, foods, dyes, or preservatives pregnant or trying to get pregnant breast-feeding How should I use this medication? This drug is given as an infusion into a vein. It is administered in a hospital or clinic by a specially trained health care professional. Talk to your pediatrician regarding the use of this medicine in children. Special care may be needed. Overdosage: If you think you have taken too much of this medicine contact a poison control center or emergency room at once. NOTE: This medicine is only for you. Do not share this medicine with others. What if I miss a dose? It is important not to miss your dose. Call your doctor or health care professional if you are unable to keep an appointment. What may interact with this medication? Do not take this medicine with any of the following medications: bevacizumab This list may not describe all possible interactions. Give your health care provider a list of all the medicines, herbs, non-prescription drugs, or dietary supplements you use. Also tell them if you smoke, drink alcohol, or use illegal drugs. Some items may interact with your medicine. What should I watch for while using this medication? Visit your doctor for checks on your progress. This drug may make you feel generally unwell. This is not uncommon, as chemotherapy can affect healthy cells as well as cancer cells. Report any side effects. Continue your course of treatment even though you feel ill unless your doctor tells you to stop. This medicine can make you more sensitive to the sun. Keep out of the sun while receiving this medicine and for 2 months after the last dose. If you cannot avoid being in the sun, wear protective clothing and use sunscreen. Do not use sun lamps or tanning beds/booths. In some cases, you may be given additional medicines to help with side effects. Follow all directions for their use. Call your doctor or health care professional for  advice if you get a fever, chills or sore throat, or other symptoms of a cold or flu. Do not treat yourself. This drug decreases your body's ability to fight infections. Try to avoid being around people who are sick. Avoid taking products that contain aspirin, acetaminophen, ibuprofen, naproxen, or ketoprofen unless instructed by your doctor. These medicines may hide a fever. Do not become pregnant while taking this medicine and for 2 months after the last dose. Women should inform their doctor if they wish to become pregnant or think they might be pregnant. There is a potential for serious side effects to an unborn child. Talk to your health care professional or pharmacist for more information. Do not breast-feed an infant while taking this medicine or for 2 months after the last dose. What side effects may I notice from receiving this medication? Side effects that you should report to your doctor or health care professional as soon as possible: allergic reactions like skin rash, itching or hives, swelling of the face, lips, or tongue breathing problems changes in vision eye pain fast, irregular heartbeat fever, chills mouth sores red spots on the skin redness, blistering, peeling or loosening of the skin, including inside the mouth signs and symptoms of kidney injury like trouble  passing urine or change in the amount of urine signs and symptoms of low blood pressure like dizziness; feeling faint or lightheaded, falls; unusually weak or tired signs of low calcium like fast heartbeat, muscle cramps or muscle pain; pain, tingling, numbness in the hands or feet; seizures signs and symptoms of low magnesium like muscle cramps, pain, or weakness; tremors; seizures; or fast, irregular heartbeat signs and symptoms of low potassium like muscle cramps or muscle pain; chest pain; dizziness; feeling faint or lightheaded, falls; palpitations; breathing problems; or fast, irregular heartbeat swelling of the  ankles, feet, hands Side effects that usually do not require medical attention (report to your doctor or health care professional if they continue or are bothersome): changes in skin like acne, cracks, skin dryness diarrhea eyelash growth headache mouth sores nail changes nausea, vomiting This list may not describe all possible side effects. Call your doctor for medical advice about side effects. You may report side effects to FDA at 1-800-FDA-1088. Where should I keep my medication? This drug is given in a hospital or clinic and will not be stored at home. NOTE: This sheet is a summary. It may not cover all possible information. If you have questions about this medicine, talk to your doctor, pharmacist, or health care provider.  2023 Elsevier/Gold Standard (2016-02-25 00:00:00)

## 2022-01-19 NOTE — Progress Notes (Signed)
Pharmacist Chemotherapy Monitoring - Initial Assessment    Anticipated start date: 01/19/22   The following has been reviewed per standard work regarding the patient's treatment regimen: The patient's diagnosis, treatment plan and drug doses, and organ/hematologic function Lab orders and baseline tests specific to treatment regimen  The treatment plan start date, drug sequencing, and pre-medications Prior authorization status  Patient's documented medication list, including drug-drug interaction screen and prescriptions for anti-emetics and supportive care specific to the treatment regimen The drug concentrations, fluid compatibility, administration routes, and timing of the medications to be used The patient's access for treatment and lifetime cumulative dose history, if applicable  The patient's medication allergies and previous infusion related reactions, if applicable   Changes made to treatment plan:  N/A  Follow up needed:  N/A   Patrica Duel, RPH, 01/19/2022  10:19 AM

## 2022-01-20 ENCOUNTER — Telehealth: Payer: Self-pay | Admitting: Emergency Medicine

## 2022-01-20 NOTE — Telephone Encounter (Signed)
24 Hour Callback 24 Hour Callback for 1st time Panitumumab infusion. Pt states that she is doing great. No N/V/D or other side effects noted. Pt knows to call with any questions or concerns.

## 2022-01-28 ENCOUNTER — Other Ambulatory Visit: Payer: Self-pay | Admitting: Oncology

## 2022-01-31 ENCOUNTER — Other Ambulatory Visit: Payer: Self-pay | Admitting: Oncology

## 2022-01-31 DIAGNOSIS — C182 Malignant neoplasm of ascending colon: Secondary | ICD-10-CM

## 2022-02-02 ENCOUNTER — Other Ambulatory Visit: Payer: BC Managed Care – PPO

## 2022-02-02 ENCOUNTER — Encounter: Payer: Self-pay | Admitting: *Deleted

## 2022-02-02 ENCOUNTER — Encounter: Payer: Self-pay | Admitting: Oncology

## 2022-02-02 ENCOUNTER — Inpatient Hospital Stay: Payer: BC Managed Care – PPO

## 2022-02-02 ENCOUNTER — Inpatient Hospital Stay (HOSPITAL_BASED_OUTPATIENT_CLINIC_OR_DEPARTMENT_OTHER): Payer: BC Managed Care – PPO | Admitting: Oncology

## 2022-02-02 VITALS — BP 123/58 | HR 62 | Temp 98.1°F | Resp 18 | Ht 67.0 in | Wt 132.4 lb

## 2022-02-02 DIAGNOSIS — D500 Iron deficiency anemia secondary to blood loss (chronic): Secondary | ICD-10-CM | POA: Diagnosis not present

## 2022-02-02 DIAGNOSIS — Z5112 Encounter for antineoplastic immunotherapy: Secondary | ICD-10-CM | POA: Diagnosis not present

## 2022-02-02 DIAGNOSIS — K7460 Unspecified cirrhosis of liver: Secondary | ICD-10-CM | POA: Diagnosis not present

## 2022-02-02 DIAGNOSIS — C787 Secondary malignant neoplasm of liver and intrahepatic bile duct: Secondary | ICD-10-CM | POA: Diagnosis not present

## 2022-02-02 DIAGNOSIS — C182 Malignant neoplasm of ascending colon: Secondary | ICD-10-CM

## 2022-02-02 DIAGNOSIS — Z86718 Personal history of other venous thrombosis and embolism: Secondary | ICD-10-CM | POA: Diagnosis not present

## 2022-02-02 DIAGNOSIS — Z7901 Long term (current) use of anticoagulants: Secondary | ICD-10-CM | POA: Diagnosis not present

## 2022-02-02 LAB — CMP (CANCER CENTER ONLY)
ALT: 14 U/L (ref 0–44)
AST: 23 U/L (ref 15–41)
Albumin: 3.2 g/dL — ABNORMAL LOW (ref 3.5–5.0)
Alkaline Phosphatase: 127 U/L — ABNORMAL HIGH (ref 38–126)
Anion gap: 7 (ref 5–15)
BUN: 29 mg/dL — ABNORMAL HIGH (ref 8–23)
CO2: 25 mmol/L (ref 22–32)
Calcium: 8.9 mg/dL (ref 8.9–10.3)
Chloride: 109 mmol/L (ref 98–111)
Creatinine: 0.73 mg/dL (ref 0.44–1.00)
GFR, Estimated: >60 mL/min (ref >60)
Glucose, Bld: 80 mg/dL (ref 70–99)
Potassium: 4.5 mmol/L (ref 3.5–5.1)
Sodium: 141 mmol/L (ref 135–145)
Total Bilirubin: 0.4 mg/dL (ref 0.3–1.2)
Total Protein: 6 g/dL — ABNORMAL LOW (ref 6.5–8.1)

## 2022-02-02 LAB — CBC WITH DIFFERENTIAL (CANCER CENTER ONLY)
Abs Immature Granulocytes: 0.01 10*3/uL (ref 0.00–0.07)
Basophils Absolute: 0 10*3/uL (ref 0.0–0.1)
Basophils Relative: 1 %
Eosinophils Absolute: 0.2 10*3/uL (ref 0.0–0.5)
Eosinophils Relative: 4 %
HCT: 29.5 % — ABNORMAL LOW (ref 36.0–46.0)
Hemoglobin: 9.5 g/dL — ABNORMAL LOW (ref 12.0–15.0)
Immature Granulocytes: 0 %
Lymphocytes Relative: 17 %
Lymphs Abs: 0.7 10*3/uL (ref 0.7–4.0)
MCH: 34.9 pg — ABNORMAL HIGH (ref 26.0–34.0)
MCHC: 32.2 g/dL (ref 30.0–36.0)
MCV: 108.5 fL — ABNORMAL HIGH (ref 80.0–100.0)
Monocytes Absolute: 0.7 10*3/uL (ref 0.1–1.0)
Monocytes Relative: 17 %
Neutro Abs: 2.7 10*3/uL (ref 1.7–7.7)
Neutrophils Relative %: 61 %
Platelet Count: 145 10*3/uL — ABNORMAL LOW (ref 150–400)
RBC: 2.72 MIL/uL — ABNORMAL LOW (ref 3.87–5.11)
RDW: 13.1 % (ref 11.5–15.5)
WBC Count: 4.4 10*3/uL (ref 4.0–10.5)
nRBC: 0 % (ref 0.0–0.2)

## 2022-02-02 LAB — MAGNESIUM: Magnesium: 2 mg/dL (ref 1.7–2.4)

## 2022-02-02 MED ORDER — SODIUM CHLORIDE 0.9 % IV SOLN: Freq: Once | INTRAVENOUS | Status: AC

## 2022-02-02 MED ORDER — SODIUM CHLORIDE 0.9% FLUSH
10.0000 mL | INTRAVENOUS | Status: DC | PRN
  Administered 2022-02-02: 10 mL

## 2022-02-02 MED ORDER — SODIUM CHLORIDE 0.9 % IV SOLN
6.0000 mg/kg | Freq: Once | INTRAVENOUS | Status: AC
  Administered 2022-02-02: 400 mg via INTRAVENOUS
  Filled 2022-02-02: qty 20

## 2022-02-02 MED ORDER — HEPARIN SOD (PORK) LOCK FLUSH 100 UNIT/ML IV SOLN
500.0000 [IU] | Freq: Once | INTRAVENOUS | Status: AC | PRN
  Administered 2022-02-02: 500 [IU]

## 2022-02-02 NOTE — Patient Instructions (Signed)
[facility]   Discharge Instructions: Thank you for choosing [facility] to provide your oncology and hematology care.   If you have a lab appointment with the Cancer Center, please go directly to the Cancer Center and check in at the registration area.   Wear comfortable clothing and clothing appropriate for easy access to any Portacath or PICC line.   We strive to give you quality time with your provider. You may need to reschedule your appointment if you arrive late (15 or more minutes).  Arriving late affects you and other patients whose appointments are after yours.  Also, if you miss three or more appointments without notifying the office, you may be dismissed from the clinic at the provider's discretion.      For prescription refill requests, have your pharmacy contact our office and allow 72 hours for refills to be completed.    Today you received the following chemotherapy and/or immunotherapy agents Panitumumab (VECTIBIX).      To help prevent nausea and vomiting after your treatment, we encourage you to take your nausea medication as directed.  BELOW ARE SYMPTOMS THAT SHOULD BE REPORTED IMMEDIATELY: *FEVER GREATER THAN 100.4 F (38 C) OR HIGHER *CHILLS OR SWEATING *NAUSEA AND VOMITING THAT IS NOT CONTROLLED WITH YOUR NAUSEA MEDICATION *UNUSUAL SHORTNESS OF BREATH *UNUSUAL BRUISING OR BLEEDING *URINARY PROBLEMS (pain or burning when urinating, or frequent urination) *BOWEL PROBLEMS (unusual diarrhea, constipation, pain near the anus) TENDERNESS IN MOUTH AND THROAT WITH OR WITHOUT PRESENCE OF ULCERS (sore throat, sores in mouth, or a toothache) UNUSUAL RASH, SWELLING OR PAIN  UNUSUAL VAGINAL DISCHARGE OR ITCHING   Items with * indicate a potential emergency and should be followed up as soon as possible or go to the Emergency Department if any problems should occur.  Please show the CHEMOTHERAPY ALERT CARD or IMMUNOTHERAPY ALERT CARD at  check-in to the Emergency Department and triage nurse.  Should you have questions after your visit or need to cancel or reschedule your appointment, please contact [facility]  Dept: [phone]  and follow the prompts.  Office hours are 8:00 a.m. to 4:30 p.m. Monday - Friday. Please note that voicemails left after 4:00 p.m. may not be returned until the following business day.  We are closed weekends and major holidays. You have access to a nurse at all times for urgent questions. Please call the main number to the clinic Dept: [phone] and follow the prompts.   For any non-urgent questions, you may also contact your provider using MyChart. We now offer e-Visits for anyone 18 and older to request care online for non-urgent symptoms. For details visit [email or web address].   Also download the MyChart app! Go to the app store, search "MyChart", open the app, select [facility], and log in with your MyChart username and password.  Masks are optional in the cancer centers. If you would like for your care team to wear a mask while they are taking care of you, please let them know. For doctor visits, patients may have with them one support person who is at least 76 years old. At this time, visitors are not allowed in the infusion area.  Panitumumab Solution for Injection What is this medication? PANITUMUMAB (pan i TOOM ue mab) is a monoclonal antibody. It is used to treat colorectal cancer. This medicine may be used for other purposes; ask your health care provider or pharmacist if you have questions. COMMON BRAND NAME(S): Vectibix   What should I tell my care team before I take this medication? They need to know if you have any of these conditions: eye disease, vision problems low levels of calcium, magnesium, or potassium in the blood lung or breathing disease, like asthma skin conditions or sensitivity an unusual or allergic reaction to panitumumab, other  medicines, foods, dyes, or preservatives pregnant or trying to get pregnant breast-feeding How should I use this medication? This drug is given as an infusion into a vein. It is administered in a hospital or clinic by a specially trained health care professional. Talk to your pediatrician regarding the use of this medicine in children. Special care may be needed. Overdosage: If you think you have taken too much of this medicine contact a poison control center or emergency room at once. NOTE: This medicine is only for you. Do not share this medicine with others. What if I miss a dose? It is important not to miss your dose. Call your doctor or health care professional if you are unable to keep an appointment. What may interact with this medication? Do not take this medicine with any of the following medications: bevacizumab This list may not describe all possible interactions. Give your health care provider a list of all the medicines, herbs, non-prescription drugs, or dietary supplements you use. Also tell them if you smoke, drink alcohol, or use illegal drugs. Some items may interact with your medicine. What should I watch for while using this medication? Visit your doctor for checks on your progress. This drug may make you feel generally unwell. This is not uncommon, as chemotherapy can affect healthy cells as well as cancer cells. Report any side effects. Continue your course of treatment even though you feel ill unless your doctor tells you to stop. This medicine can make you more sensitive to the sun. Keep out of the sun while receiving this medicine and for 2 months after the last dose. If you cannot avoid being in the sun, wear protective clothing and use sunscreen. Do not use sun lamps or tanning beds/booths. In some cases, you may be given additional medicines to help with side effects. Follow all directions for their use. Call your doctor or health care professional for advice if you get a  fever, chills or sore throat, or other symptoms of a cold or flu. Do not treat yourself. This drug decreases your body's ability to fight infections. Try to avoid being around people who are sick. Avoid taking products that contain aspirin, acetaminophen, ibuprofen, naproxen, or ketoprofen unless instructed by your doctor. These medicines may hide a fever. Do not become pregnant while taking this medicine and for 2 months after the last dose. Women should inform their doctor if they wish to become pregnant or think they might be pregnant. There is a potential for serious side effects to an unborn child. Talk to your health care professional or pharmacist for more information. Do not breast-feed an infant while taking this medicine or for 2 months after the last dose. What side effects may I notice from receiving this medication? Side effects that you should report to your doctor or health care professional as soon as possible: allergic reactions like skin rash, itching or hives, swelling of the face, lips, or tongue breathing problems changes in vision eye pain fast, irregular heartbeat fever, chills mouth sores red spots on the skin redness, blistering, peeling or loosening of the skin, including inside the mouth signs and symptoms of kidney injury like trouble   passing urine or change in the amount of urine signs and symptoms of low blood pressure like dizziness; feeling faint or lightheaded, falls; unusually weak or tired signs of low calcium like fast heartbeat, muscle cramps or muscle pain; pain, tingling, numbness in the hands or feet; seizures signs and symptoms of low magnesium like muscle cramps, pain, or weakness; tremors; seizures; or fast, irregular heartbeat signs and symptoms of low potassium like muscle cramps or muscle pain; chest pain; dizziness; feeling faint or lightheaded, falls; palpitations; breathing problems; or fast, irregular heartbeat swelling of the ankles, feet,  hands Side effects that usually do not require medical attention (report to your doctor or health care professional if they continue or are bothersome): changes in skin like acne, cracks, skin dryness diarrhea eyelash growth headache mouth sores nail changes nausea, vomiting This list may not describe all possible side effects. Call your doctor for medical advice about side effects. You may report side effects to FDA at 1-800-FDA-1088. Where should I keep my medication? This drug is given in a hospital or clinic and will not be stored at home. NOTE: This sheet is a summary. It may not cover all possible information. If you have questions about this medicine, talk to your doctor, pharmacist, or health care provider.  2023 Elsevier/Gold Standard (2016-02-25 00:00:00)  

## 2022-02-02 NOTE — Progress Notes (Signed)
[name]   Diagnosis: Colon cancer  INTERVAL HISTORY:   Stacy Burns returns as scheduled.  She completed a first cycle of panitumumab 01/19/2022.  No rash or diarrhea.  No new complaint.  Objective:  Vital signs in last 24 hours:  Blood pressure (!) 123/58, pulse 62, temperature 98.1 F (36.7 C), temperature source Oral, resp. rate 18, height $RemoveBe'5\' 7"'wOzQYWNSA$  (1.702 m), weight 132 lb 6.4 oz (60.1 kg), SpO2 100 %.    HEENT: No thrush or ulcers Resp: Lungs clear bilaterally Cardio: Regular rate and rhythm GI: Soft and nontender, firm masslike fullness in the right lower abdomen-mobile Vascular: Trace ankle edema bilaterally  Skin: No rash, ecchymoses over the arms  Portacath/PICC-without erythema  Lab Results:  Lab Results  Component Value Date   WBC 4.4 02/02/2022   HGB 9.5 (L) 02/02/2022   HCT 29.5 (L) 02/02/2022   MCV 108.5 (H) 02/02/2022   PLT 145 (L) 02/02/2022   NEUTROABS 2.7 02/02/2022    CMP  Lab Results  Component Value Date   NA 139 01/10/2022   K 4.2 01/10/2022   CL 105 01/10/2022   CO2 26 01/10/2022   GLUCOSE 85 01/10/2022   BUN 21 01/10/2022   CREATININE 0.75 01/10/2022   CALCIUM 8.9 01/10/2022   PROT 6.3 (L) 01/10/2022   ALBUMIN 3.6 01/10/2022   AST 26 01/10/2022   ALT 18 01/10/2022   ALKPHOS 131 (H) 01/10/2022   BILITOT 1.1 01/10/2022   GFRNONAA >60 01/10/2022   GFRAA >60 05/06/2020    Lab Results  Component Value Date   CEA1 54.57 (H) 12/30/2020   CEA 174.22 (H) 01/10/2022    Medications: I have reviewed the patient's current medications.   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  Cirrhosis.  Innumerable liver lesions. Colonoscopy 12/18/2019-ulcerated partially obstructing large mass in the mid ascending colon.  Biopsy invasive adenocarcinoma; preserved expression major MMR  proteins Upper endoscopy 12/18/2019-normal esophagus, stomach, examined duodenum CEA 12/19/2019-8,923 Biopsy liver lesion 12/23/2019-adenocarcinoma Foundation 1-K-ras wild-type; tumor mutation burden greater than or equal to 10; microsatellite stable; BRAF V600E Cycle 1 FOLFOX 01/01/2020 Cycle 2 FOLFOX 01/15/2020  Cycle 3 FOLFOX 01/29/2020, Udenyca added Cycle 4 FOLFOX 02/12/2020, Udenyca held Cycle 5 FOLFOX 02/26/2020, Udenyca Cycle 6 FOLFOX 03/11/2020, Udenyca held CT abdomen/pelvis 03/16/2020-stable diffuse liver lesions, stable strictured appearing ascending colon, possible pericolonic implant, vague left lower lobe nodule Cycle 1 FOLFIRI/Avastin 04/06/2020 Cycle 2 FOLFIRI/Avastin 04/21/2020 Cycle 3 FOLFIRI/Avastin 05/06/2020 Cycle 4 FOLFIRI/Avastin 05/20/2020 Cycle 5 FOLFIRI/Avastin 06/03/2020 CTs 06/15/2020-mild to moderate improvement in hepatic metastasis.  No new or progressive disease.  Narrowing within the proximal ascending colon with upstream mild cecal and terminal ileum dilatation, similar. Cycle 6 FOLFIRI/Avastin 06/17/2020 Cycle 7 FOLFIRI/Avastin 07/01/2020 Cycle 8 FOLFIRI/Avastin 07/15/2020 Cycle 9 FOLFIRI/Avastin 07/29/2020-5-FU bolus eliminated, 5-FU infusion and irinotecan dose reduced Cycle 10 FOLFIRI/Avastin 08/12/2020 CTs 08/23/2020- mild residual wall thickening involving the cecum/ascending colon.  Numerous liver metastases improved. Cycle 11 FOLFIRI/Avastin 08/25/2020 Cycle 12 FOLFIRI/Avastin 09/16/2020 Cycle 13 FOLFIRI/Avastin 10/07/2020 Cycle 14 FOLFIRI/Avastin 10/28/2020 Cycle 15 FOLFIRI/Avastin 11/18/2020 CT abdomen/pelvis 12/06/2020-mild improvement in multifocal hepatic metastases, wall thickening at the ascending colon with pericolonic inflammatory changes Cycle 16 FOLFIRI/Avastin 12/09/2020 Cycle 17 FOLFIRI/Avastin 12/30/2020 Cycle 18 FOLFIRI/Avastin 01/20/2021 Cycle 19 FOLFIRI/Avastin 02/10/2021 Cycle 20 FOLFIRI/Avastin 03/03/2021 Cycle 21 FOLFIRI/Avastin 03/24/2021 Cycle 22  FOLFIRI/Avastin 04/14/2021 CTs 05/04/2021-slight improvement in hepatic metastatic disease. Cycle 23 FOLFIRI/Avastin 05/05/2021 Cycle 24  FOLFIRI/Avastin 05/25/2021 Cycle 25 FOLFIRI/Avastin 06/15/2021 Cycle 26 FOLFIRI/Avastin 07/13/2021 Cycle 27 FOLFIRI/Avastin 08/02/2021 CTs 08/30/2021-majority of liver lesions are unchanged in size, 1 lesion adjacent to the gallbladder has increased, no adenopathy, persistent pericolonic stranding and peritoneal thickening at the cecum Progressive rise in CEA 08/31/2021-treatment changed to Xeloda/bevacizumab, began [place] 09/22/2021 CEA stable 11/03/2021 Xeloda/bevacizumab continued CTs 01/10/2022-increase in size of some of the liver lesions, others are stable, new tiny right lower lobe nodule, increased dilation of distal small bowel with increased wall thickening at the terminal ileum and ileocecal valve with 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, stable bilateral paracolic gutter peritoneal thickening Panitumumab/ Encorafenib 01/12/2022 (encorafenib 01/19/2022) 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 appears stable.  She is tolerating the panitumumab and encorafenib well to date.  The plan is to continue the current treatment.  She will complete a second cycle of panitumumab today.  She will return for an office visit and treatment in 2 weeks.   [name], MD  02/02/2022  10:00 AM

## 2022-02-02 NOTE — Progress Notes (Unsigned)
Patient seen by Dr. Sherrill today ? ?Vitals are within treatment parameters. ? ?Labs reviewed by Dr. Sherrill and are within treatment parameters. ? ?Per physician team, patient is ready for treatment and there are NO modifications to the treatment plan.  ?

## 2022-02-18 ENCOUNTER — Other Ambulatory Visit: Payer: Self-pay | Admitting: Oncology

## 2022-02-20 ENCOUNTER — Encounter: Payer: Self-pay | Admitting: *Deleted

## 2022-02-20 ENCOUNTER — Inpatient Hospital Stay: Payer: BC Managed Care – PPO | Attending: Oncology

## 2022-02-20 ENCOUNTER — Inpatient Hospital Stay: Payer: BC Managed Care – PPO

## 2022-02-20 ENCOUNTER — Other Ambulatory Visit: Payer: Self-pay | Admitting: *Deleted

## 2022-02-20 ENCOUNTER — Encounter: Payer: Self-pay | Admitting: Nurse Practitioner

## 2022-02-20 ENCOUNTER — Inpatient Hospital Stay (HOSPITAL_BASED_OUTPATIENT_CLINIC_OR_DEPARTMENT_OTHER): Payer: BC Managed Care – PPO | Admitting: Nurse Practitioner

## 2022-02-20 VITALS — BP 132/52 | HR 68 | Temp 98.2°F | Resp 18 | Ht 67.0 in | Wt 131.8 lb

## 2022-02-20 DIAGNOSIS — D500 Iron deficiency anemia secondary to blood loss (chronic): Secondary | ICD-10-CM | POA: Diagnosis not present

## 2022-02-20 DIAGNOSIS — C787 Secondary malignant neoplasm of liver and intrahepatic bile duct: Secondary | ICD-10-CM | POA: Insufficient documentation

## 2022-02-20 DIAGNOSIS — C182 Malignant neoplasm of ascending colon: Secondary | ICD-10-CM | POA: Insufficient documentation

## 2022-02-20 DIAGNOSIS — Z86718 Personal history of other venous thrombosis and embolism: Secondary | ICD-10-CM | POA: Diagnosis not present

## 2022-02-20 DIAGNOSIS — K7460 Unspecified cirrhosis of liver: Secondary | ICD-10-CM | POA: Diagnosis not present

## 2022-02-20 DIAGNOSIS — Z5112 Encounter for antineoplastic immunotherapy: Secondary | ICD-10-CM | POA: Diagnosis not present

## 2022-02-20 DIAGNOSIS — R21 Rash and other nonspecific skin eruption: Secondary | ICD-10-CM | POA: Diagnosis not present

## 2022-02-20 DIAGNOSIS — Z7901 Long term (current) use of anticoagulants: Secondary | ICD-10-CM | POA: Insufficient documentation

## 2022-02-20 LAB — CBC WITH DIFFERENTIAL (CANCER CENTER ONLY)
Abs Immature Granulocytes: 0.01 10*3/uL (ref 0.00–0.07)
Basophils Absolute: 0 10*3/uL (ref 0.0–0.1)
Basophils Relative: 1 %
Eosinophils Absolute: 0.3 10*3/uL (ref 0.0–0.5)
Eosinophils Relative: 5 %
HCT: 29.4 % — ABNORMAL LOW (ref 36.0–46.0)
Hemoglobin: 9.5 g/dL — ABNORMAL LOW (ref 12.0–15.0)
Immature Granulocytes: 0 %
Lymphocytes Relative: 15 %
Lymphs Abs: 0.7 10*3/uL (ref 0.7–4.0)
MCH: 33.5 pg (ref 26.0–34.0)
MCHC: 32.3 g/dL (ref 30.0–36.0)
MCV: 103.5 fL — ABNORMAL HIGH (ref 80.0–100.0)
Monocytes Absolute: 0.8 10*3/uL (ref 0.1–1.0)
Monocytes Relative: 17 %
Neutro Abs: 2.9 10*3/uL (ref 1.7–7.7)
Neutrophils Relative %: 62 %
Platelet Count: 101 10*3/uL — ABNORMAL LOW (ref 150–400)
RBC: 2.84 MIL/uL — ABNORMAL LOW (ref 3.87–5.11)
RDW: 13.2 % (ref 11.5–15.5)
WBC Count: 4.7 10*3/uL (ref 4.0–10.5)
nRBC: 0 % (ref 0.0–0.2)

## 2022-02-20 LAB — CMP (CANCER CENTER ONLY)
ALT: 19 U/L (ref 0–44)
AST: 26 U/L (ref 15–41)
Albumin: 3.5 g/dL (ref 3.5–5.0)
Alkaline Phosphatase: 116 U/L (ref 38–126)
Anion gap: 10 (ref 5–15)
BUN: 30 mg/dL — ABNORMAL HIGH (ref 8–23)
CO2: 23 mmol/L (ref 22–32)
Calcium: 9.1 mg/dL (ref 8.9–10.3)
Chloride: 110 mmol/L (ref 98–111)
Creatinine: 0.71 mg/dL (ref 0.44–1.00)
GFR, Estimated: >60 mL/min (ref >60)
Glucose, Bld: 79 mg/dL (ref 70–99)
Potassium: 4.4 mmol/L (ref 3.5–5.1)
Sodium: 143 mmol/L (ref 135–145)
Total Bilirubin: 0.5 mg/dL (ref 0.3–1.2)
Total Protein: 6.2 g/dL — ABNORMAL LOW (ref 6.5–8.1)

## 2022-02-20 LAB — MAGNESIUM: Magnesium: 1.8 mg/dL (ref 1.7–2.4)

## 2022-02-20 LAB — CEA (ACCESS): CEA (CHCC): 27.51 ng/mL — ABNORMAL HIGH (ref 0.00–5.00)

## 2022-02-20 MED ORDER — SODIUM CHLORIDE 0.9 % IV SOLN: Freq: Once | INTRAVENOUS | Status: AC

## 2022-02-20 MED ORDER — HEPARIN SOD (PORK) LOCK FLUSH 100 UNIT/ML IV SOLN
500.0000 [IU] | Freq: Once | INTRAVENOUS | Status: AC | PRN
  Administered 2022-02-20: 500 [IU]

## 2022-02-20 MED ORDER — SODIUM CHLORIDE 0.9% FLUSH
10.0000 mL | INTRAVENOUS | Status: DC | PRN
  Administered 2022-02-20: 10 mL

## 2022-02-20 MED ORDER — SODIUM CHLORIDE 0.9 % IV SOLN
6.0000 mg/kg | Freq: Once | INTRAVENOUS | Status: AC
  Administered 2022-02-20: 400 mg via INTRAVENOUS
  Filled 2022-02-20: qty 20

## 2022-02-20 MED ORDER — BRAFTOVI 75 MG PO CAPS: ORAL_CAPSULE | ORAL | 0 refills | Status: DC

## 2022-02-20 NOTE — Progress Notes (Signed)
Patient seen by Lisa Thomas NP today  Vitals are within treatment parameters.  Labs reviewed by Lisa Thomas NP and are within treatment parameters.  Per physician team, patient is ready for treatment and there are NO modifications to the treatment plan.     

## 2022-02-20 NOTE — Patient Instructions (Signed)
[facility]   Discharge Instructions: Thank you for choosing [facility] to provide your oncology and hematology care.   If you have a lab appointment with the [facility], please go directly to the [facility] and check in at the registration area.   Wear comfortable clothing and clothing appropriate for easy access to any Portacath or PICC line.   We strive to give you quality time with your provider. You may need to reschedule your appointment if you arrive late (15 or more minutes).  Arriving late affects you and other patients whose appointments are after yours.  Also, if you miss three or more appointments without notifying the office, you may be dismissed from the clinic at the provider's discretion.      For prescription refill requests, have your pharmacy contact our office and allow 72 hours for refills to be completed.    Today you received the following chemotherapy and/or immunotherapy agents Panitumumab (VECTIBIX).      To help prevent nausea and vomiting after your treatment, we encourage you to take your nausea medication as directed.  BELOW ARE SYMPTOMS THAT SHOULD BE REPORTED IMMEDIATELY: *FEVER GREATER THAN 100.4 F (38 C) OR HIGHER *CHILLS OR SWEATING *NAUSEA AND VOMITING THAT IS NOT CONTROLLED WITH YOUR NAUSEA MEDICATION *UNUSUAL SHORTNESS OF BREATH *UNUSUAL BRUISING OR BLEEDING *URINARY PROBLEMS (pain or burning when urinating, or frequent urination) *BOWEL PROBLEMS (unusual diarrhea, constipation, pain near the anus) TENDERNESS IN MOUTH AND THROAT WITH OR WITHOUT PRESENCE OF ULCERS (sore throat, sores in mouth, or a toothache) UNUSUAL RASH, SWELLING OR PAIN  UNUSUAL VAGINAL DISCHARGE OR ITCHING   Items with * indicate a potential emergency and should be followed up as soon as possible or go to the Emergency Department if any problems should occur.  Please show the CHEMOTHERAPY ALERT CARD or IMMUNOTHERAPY ALERT CARD at  check-in to the Emergency Department and triage nurse.  Should you have questions after your visit or need to cancel or reschedule your appointment, please contact [facility]  Dept: [phone]  and follow the prompts.  Office hours are 8:00 a.m. to 4:30 p.m. Monday - Friday. Please note that voicemails left after 4:00 p.m. may not be returned until the following business day.  We are closed weekends and major holidays. You have access to a nurse at all times for urgent questions. Please call the main number to the clinic Dept: [phone] and follow the prompts.   For any non-urgent questions, you may also contact your provider using MyChart. We now offer e-Visits for anyone 76 and older to request care online for non-urgent symptoms. For details visit [email or web address].   Also download the MyChart app! Go to the app store, search "MyChart", open the app, select [facility], and log in with your MyChart username and password.  Masks are optional in the cancer centers. If you would like for your care team to wear a mask while they are taking care of you, please let them know. For doctor visits, patients may have with them one support person who is at least 76 years old. At this time, visitors are not allowed in the infusion area.  Panitumumab Solution for Injection What is this medication? PANITUMUMAB (pan i TOOM ue mab) is a monoclonal antibody. It is used to treat colorectal cancer. This medicine may be used for other purposes; ask your health care provider or pharmacist if you have questions. COMMON BRAND NAME(S): Vectibix  What should I tell my care team before I take this medication? They need to know if you have any of these conditions: eye disease, vision problems low levels of calcium, magnesium, or potassium in the blood lung or breathing disease, like asthma skin conditions or sensitivity an unusual or allergic reaction to panitumumab, other  medicines, foods, dyes, or preservatives pregnant or trying to get pregnant breast-feeding How should I use this medication? This drug is given as an infusion into a vein. It is administered in a hospital or clinic by a specially trained health care professional. Talk to your pediatrician regarding the use of this medicine in children. Special care may be needed. Overdosage: If you think you have taken too much of this medicine contact a poison control center or emergency room at once. NOTE: This medicine is only for you. Do not share this medicine with others. What if I miss a dose? It is important not to miss your dose. Call your doctor or health care professional if you are unable to keep an appointment. What may interact with this medication? Do not take this medicine with any of the following medications: bevacizumab This list may not describe all possible interactions. Give your health care provider a list of all the medicines, herbs, non-prescription drugs, or dietary supplements you use. Also tell them if you smoke, drink alcohol, or use illegal drugs. Some items may interact with your medicine. What should I watch for while using this medication? Visit your doctor for checks on your progress. This drug may make you feel generally unwell. This is not uncommon, as chemotherapy can affect healthy cells as well as cancer cells. Report any side effects. Continue your course of treatment even though you feel ill unless your doctor tells you to stop. This medicine can make you more sensitive to the sun. Keep out of the sun while receiving this medicine and for 2 months after the last dose. If you cannot avoid being in the sun, wear protective clothing and use sunscreen. Do not use sun lamps or tanning beds/booths. In some cases, you may be given additional medicines to help with side effects. Follow all directions for their use. Call your doctor or health care professional for advice if you get a  fever, chills or sore throat, or other symptoms of a cold or flu. Do not treat yourself. This drug decreases your body's ability to fight infections. Try to avoid being around people who are sick. Avoid taking products that contain aspirin, acetaminophen, ibuprofen, naproxen, or ketoprofen unless instructed by your doctor. These medicines may hide a fever. Do not become pregnant while taking this medicine and for 2 months after the last dose. Women should inform their doctor if they wish to become pregnant or think they might be pregnant. There is a potential for serious side effects to an unborn child. Talk to your health care professional or pharmacist for more information. Do not breast-feed an infant while taking this medicine or for 2 months after the last dose. What side effects may I notice from receiving this medication? Side effects that you should report to your doctor or health care professional as soon as possible: allergic reactions like skin rash, itching or hives, swelling of the face, lips, or tongue breathing problems changes in vision eye pain fast, irregular heartbeat fever, chills mouth sores red spots on the skin redness, blistering, peeling or loosening of the skin, including inside the mouth signs and symptoms of kidney injury like trouble  passing urine or change in the amount of urine signs and symptoms of low blood pressure like dizziness; feeling faint or lightheaded, falls; unusually weak or tired signs of low calcium like fast heartbeat, muscle cramps or muscle pain; pain, tingling, numbness in the hands or feet; seizures signs and symptoms of low magnesium like muscle cramps, pain, or weakness; tremors; seizures; or fast, irregular heartbeat signs and symptoms of low potassium like muscle cramps or muscle pain; chest pain; dizziness; feeling faint or lightheaded, falls; palpitations; breathing problems; or fast, irregular heartbeat swelling of the ankles, feet,  hands Side effects that usually do not require medical attention (report to your doctor or health care professional if they continue or are bothersome): changes in skin like acne, cracks, skin dryness diarrhea eyelash growth headache mouth sores nail changes nausea, vomiting This list may not describe all possible side effects. Call your doctor for medical advice about side effects. You may report side effects to FDA at 1-800-FDA-1088. Where should I keep my medication? This drug is given in a hospital or clinic and will not be stored at home. NOTE: This sheet is a summary. It may not cover all possible information. If you have questions about this medicine, talk to your doctor, pharmacist, or health care provider.  2023 Elsevier/Gold Standard (2016-02-25 00:00:00)

## 2022-02-20 NOTE — Progress Notes (Signed)
[facility] OFFICE PROGRESS NOTE   Diagnosis: Colon cancer  INTERVAL HISTORY:   [name] returns as scheduled.  She continues encorafenib.  She completed cycle 2 Panitumumab 02/02/2022.  She feels well.  She denies nausea/vomiting.  No mouth sores.  No diarrhea.  No rash.  She has a good appetite.  Eyes periodically feel "dry" if she wakes up during the night.  No other eye symptoms.  Objective:  Vital signs in last 24 hours:  Blood pressure (!) 132/52, pulse 68, temperature 98.2 F (36.8 C), temperature source Oral, resp. rate 18, height $RemoveBe'5\' 7"'UrgsIRhru$  (1.702 m), weight 131 lb 12.8 oz (59.8 kg), SpO2 100 %.    HEENT: No thrush or ulcers.  No conjunctival erythema. Resp: Lungs clear bilaterally. Cardio: Regular rate and rhythm. GI: Abdomen soft and nontender.  Palpable liver edge, nontender. Vascular: Trace ankle edema bilaterally. Skin: No rash.  Ecchymoses scattered over the forearms. Port-A-Cath without erythema.   Lab Results:  Lab Results  Component Value Date   WBC 4.7 02/20/2022   HGB 9.5 (L) 02/20/2022   HCT 29.4 (L) 02/20/2022   MCV 103.5 (H) 02/20/2022   PLT 101 (L) 02/20/2022   NEUTROABS 2.9 02/20/2022    Imaging:  No results found.  Medications: I have reviewed the patient's current medications.  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  Cirrhosis.  Innumerable liver lesions. Colonoscopy 12/18/2019-ulcerated partially obstructing large mass in the mid ascending colon.  Biopsy invasive adenocarcinoma; preserved expression major MMR proteins Upper endoscopy 12/18/2019-normal esophagus, stomach, examined duodenum CEA 12/19/2019-8,923 Biopsy liver lesion 12/23/2019-adenocarcinoma Foundation 1-K-ras wild-type; tumor mutation burden greater than or equal to 10; microsatellite stable; BRAF V600E Cycle 1 FOLFOX 01/01/2020 Cycle 2 FOLFOX  01/15/2020  Cycle 3 FOLFOX 01/29/2020, Udenyca added Cycle 4 FOLFOX 02/12/2020, Udenyca held Cycle 5 FOLFOX 02/26/2020, Udenyca Cycle 6 FOLFOX 03/11/2020, Udenyca held CT abdomen/pelvis 03/16/2020-stable diffuse liver lesions, stable strictured appearing ascending colon, possible pericolonic implant, vague left lower lobe nodule Cycle 1 FOLFIRI/Avastin 04/06/2020 Cycle 2 FOLFIRI/Avastin 04/21/2020 Cycle 3 FOLFIRI/Avastin 05/06/2020 Cycle 4 FOLFIRI/Avastin 05/20/2020 Cycle 5 FOLFIRI/Avastin 06/03/2020 CTs 06/15/2020-mild to moderate improvement in hepatic metastasis.  No new or progressive disease.  Narrowing within the proximal ascending colon with upstream mild cecal and terminal ileum dilatation, similar. Cycle 6 FOLFIRI/Avastin 06/17/2020 Cycle 7 FOLFIRI/Avastin 07/01/2020 Cycle 8 FOLFIRI/Avastin 07/15/2020 Cycle 9 FOLFIRI/Avastin 07/29/2020-5-FU bolus eliminated, 5-FU infusion and irinotecan dose reduced Cycle 10 FOLFIRI/Avastin 08/12/2020 CTs 08/23/2020- mild residual wall thickening involving the cecum/ascending colon.  Numerous liver metastases improved. Cycle 11 FOLFIRI/Avastin 08/25/2020 Cycle 12 FOLFIRI/Avastin 09/16/2020 Cycle 13 FOLFIRI/Avastin 10/07/2020 Cycle 14 FOLFIRI/Avastin 10/28/2020 Cycle 15 FOLFIRI/Avastin 11/18/2020 CT abdomen/pelvis 12/06/2020-mild improvement in multifocal hepatic metastases, wall thickening at the ascending colon with pericolonic inflammatory changes Cycle 16 FOLFIRI/Avastin 12/09/2020 Cycle 17 FOLFIRI/Avastin 12/30/2020 Cycle 18 FOLFIRI/Avastin 01/20/2021 Cycle 19 FOLFIRI/Avastin 02/10/2021 Cycle 20 FOLFIRI/Avastin 03/03/2021 Cycle 21 FOLFIRI/Avastin 03/24/2021 Cycle 22 FOLFIRI/Avastin 04/14/2021 CTs 05/04/2021-slight improvement in hepatic metastatic disease. Cycle 23 FOLFIRI/Avastin 05/05/2021 Cycle 24 FOLFIRI/Avastin 05/25/2021 Cycle 25 FOLFIRI/Avastin 06/15/2021 Cycle 26 FOLFIRI/Avastin 07/13/2021 Cycle 27 FOLFIRI/Avastin 08/02/2021 CTs 08/30/2021-majority of liver  lesions are unchanged in size, 1 lesion adjacent to the gallbladder has increased, no adenopathy, persistent pericolonic stranding and peritoneal thickening at the cecum Progressive rise in CEA 08/31/2021-treatment changed to Xeloda/bevacizumab, began [place] 09/22/2021 CEA stable 11/03/2021 Xeloda/bevacizumab continued CTs 01/10/2022-increase in size of some of the liver lesions, others are stable, new  tiny right lower lobe nodule, increased dilation of distal small bowel with increased wall thickening at the terminal ileum and ileocecal valve with 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, stable bilateral paracolic gutter peritoneal thickening Panitumumab/ Encorafenib 01/12/2022 (encorafenib 01/19/2022) Panitumumab 02/02/2022 Panitumumab 02/20/2022 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  She continues encorafenib.  She has completed 2 cycles of Panitumumab.  She seems to be tolerating treatment well.  Plan to proceed with cycle 3 Panitumumab today as scheduled.  CBC and chemistry panel reviewed.  Labs adequate to proceed as above.  She will return for lab, follow-up, Panitumumab in 2 weeks.    Stacy Burns ANP/GNP-BC   02/20/2022  11:51 AM

## 2022-03-03 DIAGNOSIS — H2511 Age-related nuclear cataract, right eye: Secondary | ICD-10-CM | POA: Diagnosis not present

## 2022-03-03 DIAGNOSIS — H2512 Age-related nuclear cataract, left eye: Secondary | ICD-10-CM | POA: Diagnosis not present

## 2022-03-04 ENCOUNTER — Other Ambulatory Visit: Payer: Self-pay | Admitting: Oncology

## 2022-03-06 ENCOUNTER — Inpatient Hospital Stay: Payer: BC Managed Care – PPO

## 2022-03-06 ENCOUNTER — Inpatient Hospital Stay: Payer: BC Managed Care – PPO | Admitting: Oncology

## 2022-03-06 ENCOUNTER — Inpatient Hospital Stay (HOSPITAL_BASED_OUTPATIENT_CLINIC_OR_DEPARTMENT_OTHER): Payer: BC Managed Care – PPO | Admitting: Nurse Practitioner

## 2022-03-06 ENCOUNTER — Other Ambulatory Visit: Payer: Self-pay

## 2022-03-06 ENCOUNTER — Encounter: Payer: Self-pay | Admitting: Nurse Practitioner

## 2022-03-06 ENCOUNTER — Encounter: Payer: Self-pay | Admitting: *Deleted

## 2022-03-06 VITALS — BP 128/58 | HR 68 | Temp 98.1°F | Resp 18 | Ht 67.0 in | Wt 136.8 lb

## 2022-03-06 VITALS — BP 128/49 | HR 66

## 2022-03-06 DIAGNOSIS — Z86718 Personal history of other venous thrombosis and embolism: Secondary | ICD-10-CM | POA: Diagnosis not present

## 2022-03-06 DIAGNOSIS — R21 Rash and other nonspecific skin eruption: Secondary | ICD-10-CM | POA: Diagnosis not present

## 2022-03-06 DIAGNOSIS — C182 Malignant neoplasm of ascending colon: Secondary | ICD-10-CM

## 2022-03-06 DIAGNOSIS — D500 Iron deficiency anemia secondary to blood loss (chronic): Secondary | ICD-10-CM | POA: Diagnosis not present

## 2022-03-06 DIAGNOSIS — K7460 Unspecified cirrhosis of liver: Secondary | ICD-10-CM | POA: Diagnosis not present

## 2022-03-06 DIAGNOSIS — Z7901 Long term (current) use of anticoagulants: Secondary | ICD-10-CM | POA: Diagnosis not present

## 2022-03-06 DIAGNOSIS — C787 Secondary malignant neoplasm of liver and intrahepatic bile duct: Secondary | ICD-10-CM | POA: Diagnosis not present

## 2022-03-06 DIAGNOSIS — Z5112 Encounter for antineoplastic immunotherapy: Secondary | ICD-10-CM | POA: Diagnosis not present

## 2022-03-06 LAB — CMP (CANCER CENTER ONLY)
ALT: 21 U/L (ref 0–44)
AST: 23 U/L (ref 15–41)
Albumin: 3.3 g/dL — ABNORMAL LOW (ref 3.5–5.0)
Alkaline Phosphatase: 140 U/L — ABNORMAL HIGH (ref 38–126)
Anion gap: 8 (ref 5–15)
BUN: 25 mg/dL — ABNORMAL HIGH (ref 8–23)
CO2: 23 mmol/L (ref 22–32)
Calcium: 8.7 mg/dL — ABNORMAL LOW (ref 8.9–10.3)
Chloride: 110 mmol/L (ref 98–111)
Creatinine: 0.73 mg/dL (ref 0.44–1.00)
GFR, Estimated: >60 mL/min (ref >60)
Glucose, Bld: 112 mg/dL — ABNORMAL HIGH (ref 70–99)
Potassium: 3.8 mmol/L (ref 3.5–5.1)
Sodium: 141 mmol/L (ref 135–145)
Total Bilirubin: 0.5 mg/dL (ref 0.3–1.2)
Total Protein: 5.7 g/dL — ABNORMAL LOW (ref 6.5–8.1)

## 2022-03-06 LAB — CBC WITH DIFFERENTIAL (CANCER CENTER ONLY)
Abs Immature Granulocytes: 0.01 K/uL (ref 0.00–0.07)
Basophils Absolute: 0 K/uL (ref 0.0–0.1)
Basophils Relative: 0 %
Eosinophils Absolute: 0.2 K/uL (ref 0.0–0.5)
Eosinophils Relative: 4 %
HCT: 27.7 % — ABNORMAL LOW (ref 36.0–46.0)
Hemoglobin: 8.8 g/dL — ABNORMAL LOW (ref 12.0–15.0)
Immature Granulocytes: 0 %
Lymphocytes Relative: 10 %
Lymphs Abs: 0.5 K/uL — ABNORMAL LOW (ref 0.7–4.0)
MCH: 32.4 pg (ref 26.0–34.0)
MCHC: 31.8 g/dL (ref 30.0–36.0)
MCV: 101.8 fL — ABNORMAL HIGH (ref 80.0–100.0)
Monocytes Absolute: 0.7 K/uL (ref 0.1–1.0)
Monocytes Relative: 13 %
Neutro Abs: 3.6 K/uL (ref 1.7–7.7)
Neutrophils Relative %: 73 %
Platelet Count: 88 K/uL — ABNORMAL LOW (ref 150–400)
RBC: 2.72 MIL/uL — ABNORMAL LOW (ref 3.87–5.11)
RDW: 14.7 % (ref 11.5–15.5)
WBC Count: 5 K/uL (ref 4.0–10.5)
nRBC: 0 % (ref 0.0–0.2)

## 2022-03-06 LAB — MAGNESIUM: Magnesium: 1.8 mg/dL (ref 1.7–2.4)

## 2022-03-06 LAB — CEA (ACCESS): CEA (CHCC): 73.06 ng/mL — ABNORMAL HIGH (ref 0.00–5.00)

## 2022-03-06 MED ORDER — ENOXAPARIN SODIUM 40 MG/0.4ML IJ SOSY: PREFILLED_SYRINGE | INTRAMUSCULAR | 2 refills | Status: DC

## 2022-03-06 MED ORDER — SODIUM CHLORIDE 0.9% FLUSH
10.0000 mL | INTRAVENOUS | Status: DC | PRN
  Administered 2022-03-06: 10 mL

## 2022-03-06 MED ORDER — SODIUM CHLORIDE 0.9 % IV SOLN: Freq: Once | INTRAVENOUS | Status: AC

## 2022-03-06 MED ORDER — SODIUM CHLORIDE 0.9 % IV SOLN
6.0000 mg/kg | Freq: Once | INTRAVENOUS | Status: AC
  Administered 2022-03-06: 400 mg via INTRAVENOUS
  Filled 2022-03-06: qty 20

## 2022-03-06 MED ORDER — HEPARIN SOD (PORK) LOCK FLUSH 100 UNIT/ML IV SOLN
500.0000 [IU] | Freq: Once | INTRAVENOUS | Status: AC | PRN
  Administered 2022-03-06: 500 [IU]

## 2022-03-06 NOTE — Progress Notes (Signed)
Patient seen by Ned Card NP today  Vitals are within treatment parameters.  Labs reviewed by Ned Card NP and are not all within treatment parameters. Platelet count 88,000 --OK to treat. MD aware of increase in CEA--proceed w/current treatment. Doing well clinically and will recheck at next visit.  Per physician team, patient is ready for treatment and there are NO modifications to the treatment plan.

## 2022-03-06 NOTE — Progress Notes (Signed)
[facility] OFFICE PROGRESS NOTE   Diagnosis: Colon cancer  INTERVAL HISTORY:   Stacy Burns returns as scheduled.  She continues encorafenib.  She completed cycle 3 Panitumumab 02/20/2022.  She denies nausea/vomiting.  No mouth sores.  No diarrhea.  She has developed a rash on her nose and right malar region.  She feels this is related to tape that was placed in this distribution after recent cataract surgery.  She has a good appetite.  She is gaining weight.  Energy level varies.  She denies pain.  No fever, cough, shortness of breath.  Objective:  Vital signs in last 24 hours:  Blood pressure (!) 128/58, pulse 68, temperature 98.1 F (36.7 C), resp. rate 18, height $RemoveBe'5\' 7"'oyqtSlGEs$  (1.702 m), weight 136 lb 12.8 oz (62.1 kg), SpO2 100 %.    HEENT:  No thrush or ulcers. Resp: Lungs clear bilaterally. Cardio: Regular rate and rhythm. GI: Abdomen soft and nontender.  Liver edge is palpable. Vascular: Trace bilateral ankle edema. Skin: Ecchymoses scattered over the forearms. Small scabbed lesions on the nose and right malar region in a linear distribution.  Port-A-Cath without erythema.  Lab Results:  Lab Results  Component Value Date   WBC 5.0 03/06/2022   HGB 8.8 (L) 03/06/2022   HCT 27.7 (L) 03/06/2022   MCV 101.8 (H) 03/06/2022   PLT 88 (L) 03/06/2022   NEUTROABS 3.6 03/06/2022    Imaging:  No results found.  Medications: I have reviewed the patient's current medications.  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  Cirrhosis.  Innumerable liver lesions. Colonoscopy 12/18/2019-ulcerated partially obstructing large mass in the mid ascending colon.  Biopsy invasive adenocarcinoma; preserved expression major MMR proteins Upper endoscopy 12/18/2019-normal esophagus, stomach, examined duodenum CEA 12/19/2019-8,923 Biopsy liver lesion  12/23/2019-adenocarcinoma Foundation 1-K-ras wild-type; tumor mutation burden greater than or equal to 10; microsatellite stable; BRAF V600E Cycle 1 FOLFOX 01/01/2020 Cycle 2 FOLFOX 01/15/2020  Cycle 3 FOLFOX 01/29/2020, Udenyca added Cycle 4 FOLFOX 02/12/2020, Udenyca held Cycle 5 FOLFOX 02/26/2020, Udenyca Cycle 6 FOLFOX 03/11/2020, Udenyca held CT abdomen/pelvis 03/16/2020-stable diffuse liver lesions, stable strictured appearing ascending colon, possible pericolonic implant, vague left lower lobe nodule Cycle 1 FOLFIRI/Avastin 04/06/2020 Cycle 2 FOLFIRI/Avastin 04/21/2020 Cycle 3 FOLFIRI/Avastin 05/06/2020 Cycle 4 FOLFIRI/Avastin 05/20/2020 Cycle 5 FOLFIRI/Avastin 06/03/2020 CTs 06/15/2020-mild to moderate improvement in hepatic metastasis.  No new or progressive disease.  Narrowing within the proximal ascending colon with upstream mild cecal and terminal ileum dilatation, similar. Cycle 6 FOLFIRI/Avastin 06/17/2020 Cycle 7 FOLFIRI/Avastin 07/01/2020 Cycle 8 FOLFIRI/Avastin 07/15/2020 Cycle 9 FOLFIRI/Avastin 07/29/2020-5-FU bolus eliminated, 5-FU infusion and irinotecan dose reduced Cycle 10 FOLFIRI/Avastin 08/12/2020 CTs 08/23/2020- mild residual wall thickening involving the cecum/ascending colon.  Numerous liver metastases improved. Cycle 11 FOLFIRI/Avastin 08/25/2020 Cycle 12 FOLFIRI/Avastin 09/16/2020 Cycle 13 FOLFIRI/Avastin 10/07/2020 Cycle 14 FOLFIRI/Avastin 10/28/2020 Cycle 15 FOLFIRI/Avastin 11/18/2020 CT abdomen/pelvis 12/06/2020-mild improvement in multifocal hepatic metastases, wall thickening at the ascending colon with pericolonic inflammatory changes Cycle 16 FOLFIRI/Avastin 12/09/2020 Cycle 17 FOLFIRI/Avastin 12/30/2020 Cycle 18 FOLFIRI/Avastin 01/20/2021 Cycle 19 FOLFIRI/Avastin 02/10/2021 Cycle 20 FOLFIRI/Avastin 03/03/2021 Cycle 21 FOLFIRI/Avastin 03/24/2021 Cycle 22 FOLFIRI/Avastin 04/14/2021 CTs 05/04/2021-slight improvement in hepatic metastatic disease. Cycle 23 FOLFIRI/Avastin  05/05/2021 Cycle 24 FOLFIRI/Avastin 05/25/2021 Cycle 25 FOLFIRI/Avastin 06/15/2021 Cycle 26 FOLFIRI/Avastin 07/13/2021 Cycle 27 FOLFIRI/Avastin 08/02/2021 CTs 08/30/2021-majority of liver lesions are unchanged in size, 1 lesion adjacent to the gallbladder has increased, no adenopathy, persistent pericolonic stranding and peritoneal thickening at  the cecum Progressive rise in CEA 08/31/2021-treatment changed to Xeloda/bevacizumab, began [place] 09/22/2021 CEA stable 11/03/2021 Xeloda/bevacizumab continued CTs 01/10/2022-increase in size of some of the liver lesions, others are stable, new tiny right lower lobe nodule, increased dilation of distal small bowel with increased wall thickening at the terminal ileum and ileocecal valve with 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, stable bilateral paracolic gutter peritoneal thickening Panitumumab/ Encorafenib 01/12/2022 (encorafenib 01/19/2022) Panitumumab 02/02/2022 Panitumumab 02/20/2022 Panitumumab 03/06/2022 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  She will continue encorafenib.  Plan to proceed with cycle 4 Panitumumab today as scheduled.  We discussed the CEA being higher than 2 weeks ago.  She is doing well clinically.  Plan to follow the CEA for now.  She will be referred for restaging CTs after the next cycle of Panitumumab.  CBC and chemistry panel reviewed.  Labs adequate to proceed as above.  Hemoglobin and platelets are lower than 2 weeks ago.  Follow for now.  The rash on her face may be a tape reaction.  She will contact the office if the rash  increases or becomes apparent in other areas.  She will return for lab, follow-up, Panitumumab in 2 weeks.  We are available to see her sooner if needed.  Ned Card ANP/GNP-BC   03/06/2022  8:51 AM

## 2022-03-06 NOTE — Patient Instructions (Signed)
[facility]   Discharge Instructions: Thank you for choosing [facility] to provide your oncology and hematology care.   If you have a lab appointment with the [facility], please go directly to the [facility] and check in at the registration area.   Wear comfortable clothing and clothing appropriate for easy access to any Portacath or PICC line.   We strive to give you quality time with your provider. You may need to reschedule your appointment if you arrive late (15 or more minutes).  Arriving late affects you and other patients whose appointments are after yours.  Also, if you miss three or more appointments without notifying the office, you may be dismissed from the clinic at the provider's discretion.      For prescription refill requests, have your pharmacy contact our office and allow 72 hours for refills to be completed.    Today you received the following chemotherapy and/or immunotherapy agents Vectibix.       To help prevent nausea and vomiting after your treatment, we encourage you to take your nausea medication as directed.  BELOW ARE SYMPTOMS THAT SHOULD BE REPORTED IMMEDIATELY: *FEVER GREATER THAN 100.4 F (38 C) OR HIGHER *CHILLS OR SWEATING *NAUSEA AND VOMITING THAT IS NOT CONTROLLED WITH YOUR NAUSEA MEDICATION *UNUSUAL SHORTNESS OF BREATH *UNUSUAL BRUISING OR BLEEDING *URINARY PROBLEMS (pain or burning when urinating, or frequent urination) *BOWEL PROBLEMS (unusual diarrhea, constipation, pain near the anus) TENDERNESS IN MOUTH AND THROAT WITH OR WITHOUT PRESENCE OF ULCERS (sore throat, sores in mouth, or a toothache) UNUSUAL RASH, SWELLING OR PAIN  UNUSUAL VAGINAL DISCHARGE OR ITCHING   Items with * indicate a potential emergency and should be followed up as soon as possible or go to the Emergency Department if any problems should occur.  Please show the CHEMOTHERAPY ALERT CARD or IMMUNOTHERAPY ALERT CARD at check-in to  the Emergency Department and triage nurse.  Should you have questions after your visit or need to cancel or reschedule your appointment, please contact [facility]  Dept: [phone]  and follow the prompts.  Office hours are 8:00 a.m. to 4:30 p.m. Monday - Friday. Please note that voicemails left after 4:00 p.m. may not be returned until the following business day.  We are closed weekends and major holidays. You have access to a nurse at all times for urgent questions. Please call the main number to the clinic Dept: [phone] and follow the prompts.   For any non-urgent questions, you may also contact your provider using MyChart. We now offer e-Visits for anyone 23 and older to request care online for non-urgent symptoms. For details visit [email or web address].   Also download the MyChart app! Go to the app store, search "MyChart", open the app, select [facility], and log in with your MyChart username and password.  Masks are optional in the cancer centers. If you would like for your care team to wear a mask while they are taking care of you, please let them know. For doctor visits, patients may have with them one support person who is at least 76 years old. At this time, visitors are not allowed in the infusion area.  Panitumumab Solution for Injection What is this medication? PANITUMUMAB (pan i TOOM ue mab) is a monoclonal antibody. It is used to treat colorectal cancer. This medicine may be used for other purposes; ask your health care provider or pharmacist if you have questions. COMMON BRAND NAME(S): Vectibix  What should I tell my care team before I take this medication? They need to know if you have any of these conditions: eye disease, vision problems low levels of calcium, magnesium, or potassium in the blood lung or breathing disease, like asthma skin conditions or sensitivity an unusual or allergic reaction to panitumumab, other medicines, foods,  dyes, or preservatives pregnant or trying to get pregnant breast-feeding How should I use this medication? This drug is given as an infusion into a vein. It is administered in a hospital or clinic by a specially trained health care professional. Talk to your pediatrician regarding the use of this medicine in children. Special care may be needed. Overdosage: If you think you have taken too much of this medicine contact a poison control center or emergency room at once. NOTE: This medicine is only for you. Do not share this medicine with others. What if I miss a dose? It is important not to miss your dose. Call your doctor or health care professional if you are unable to keep an appointment. What may interact with this medication? Do not take this medicine with any of the following medications: bevacizumab This list may not describe all possible interactions. Give your health care provider a list of all the medicines, herbs, non-prescription drugs, or dietary supplements you use. Also tell them if you smoke, drink alcohol, or use illegal drugs. Some items may interact with your medicine. What should I watch for while using this medication? Visit your doctor for checks on your progress. This drug may make you feel generally unwell. This is not uncommon, as chemotherapy can affect healthy cells as well as cancer cells. Report any side effects. Continue your course of treatment even though you feel ill unless your doctor tells you to stop. This medicine can make you more sensitive to the sun. Keep out of the sun while receiving this medicine and for 2 months after the last dose. If you cannot avoid being in the sun, wear protective clothing and use sunscreen. Do not use sun lamps or tanning beds/booths. In some cases, you may be given additional medicines to help with side effects. Follow all directions for their use. Call your doctor or health care professional for advice if you get a fever, chills or  sore throat, or other symptoms of a cold or flu. Do not treat yourself. This drug decreases your body's ability to fight infections. Try to avoid being around people who are sick. Avoid taking products that contain aspirin, acetaminophen, ibuprofen, naproxen, or ketoprofen unless instructed by your doctor. These medicines may hide a fever. Do not become pregnant while taking this medicine and for 2 months after the last dose. Women should inform their doctor if they wish to become pregnant or think they might be pregnant. There is a potential for serious side effects to an unborn child. Talk to your health care professional or pharmacist for more information. Do not breast-feed an infant while taking this medicine or for 2 months after the last dose. What side effects may I notice from receiving this medication? Side effects that you should report to your doctor or health care professional as soon as possible: allergic reactions like skin rash, itching or hives, swelling of the face, lips, or tongue breathing problems changes in vision eye pain fast, irregular heartbeat fever, chills mouth sores red spots on the skin redness, blistering, peeling or loosening of the skin, including inside the mouth signs and symptoms of kidney injury like trouble  passing urine or change in the amount of urine signs and symptoms of low blood pressure like dizziness; feeling faint or lightheaded, falls; unusually weak or tired signs of low calcium like fast heartbeat, muscle cramps or muscle pain; pain, tingling, numbness in the hands or feet; seizures signs and symptoms of low magnesium like muscle cramps, pain, or weakness; tremors; seizures; or fast, irregular heartbeat signs and symptoms of low potassium like muscle cramps or muscle pain; chest pain; dizziness; feeling faint or lightheaded, falls; palpitations; breathing problems; or fast, irregular heartbeat swelling of the ankles, feet, hands Side effects that  usually do not require medical attention (report to your doctor or health care professional if they continue or are bothersome): changes in skin like acne, cracks, skin dryness diarrhea eyelash growth headache mouth sores nail changes nausea, vomiting This list may not describe all possible side effects. Call your doctor for medical advice about side effects. You may report side effects to FDA at 1-800-FDA-1088. Where should I keep my medication? This drug is given in a hospital or clinic and will not be stored at home. NOTE: This sheet is a summary. It may not cover all possible information. If you have questions about this medicine, talk to your doctor, pharmacist, or health care provider.  2023 Elsevier/Gold Standard (2016-02-25 00:00:00)

## 2022-03-07 ENCOUNTER — Other Ambulatory Visit: Payer: Self-pay | Admitting: Oncology

## 2022-03-07 ENCOUNTER — Other Ambulatory Visit: Payer: Self-pay

## 2022-03-07 DIAGNOSIS — C182 Malignant neoplasm of ascending colon: Secondary | ICD-10-CM

## 2022-03-14 ENCOUNTER — Other Ambulatory Visit: Payer: Self-pay

## 2022-03-17 ENCOUNTER — Other Ambulatory Visit: Payer: Self-pay

## 2022-03-19 ENCOUNTER — Other Ambulatory Visit: Payer: Self-pay | Admitting: Oncology

## 2022-03-20 ENCOUNTER — Inpatient Hospital Stay: Payer: BC Managed Care – PPO

## 2022-03-20 ENCOUNTER — Inpatient Hospital Stay: Payer: BC Managed Care – PPO | Attending: Oncology

## 2022-03-20 ENCOUNTER — Inpatient Hospital Stay (HOSPITAL_BASED_OUTPATIENT_CLINIC_OR_DEPARTMENT_OTHER): Payer: BC Managed Care – PPO | Admitting: Oncology

## 2022-03-20 ENCOUNTER — Encounter: Payer: Self-pay | Admitting: *Deleted

## 2022-03-20 VITALS — BP 112/58 | HR 65 | Temp 98.1°F | Resp 20 | Ht 67.0 in | Wt 133.6 lb

## 2022-03-20 VITALS — BP 134/51 | HR 63

## 2022-03-20 DIAGNOSIS — Z86718 Personal history of other venous thrombosis and embolism: Secondary | ICD-10-CM | POA: Insufficient documentation

## 2022-03-20 DIAGNOSIS — Z5112 Encounter for antineoplastic immunotherapy: Secondary | ICD-10-CM | POA: Diagnosis not present

## 2022-03-20 DIAGNOSIS — Z7901 Long term (current) use of anticoagulants: Secondary | ICD-10-CM | POA: Insufficient documentation

## 2022-03-20 DIAGNOSIS — C182 Malignant neoplasm of ascending colon: Secondary | ICD-10-CM | POA: Insufficient documentation

## 2022-03-20 DIAGNOSIS — C787 Secondary malignant neoplasm of liver and intrahepatic bile duct: Secondary | ICD-10-CM | POA: Insufficient documentation

## 2022-03-20 DIAGNOSIS — R21 Rash and other nonspecific skin eruption: Secondary | ICD-10-CM | POA: Diagnosis not present

## 2022-03-20 DIAGNOSIS — D500 Iron deficiency anemia secondary to blood loss (chronic): Secondary | ICD-10-CM | POA: Diagnosis not present

## 2022-03-20 DIAGNOSIS — K7460 Unspecified cirrhosis of liver: Secondary | ICD-10-CM | POA: Insufficient documentation

## 2022-03-20 LAB — CMP (CANCER CENTER ONLY)
ALT: 12 U/L (ref 0–44)
AST: 17 U/L (ref 15–41)
Albumin: 3.6 g/dL (ref 3.5–5.0)
Alkaline Phosphatase: 126 U/L (ref 38–126)
Anion gap: 8 (ref 5–15)
BUN: 18 mg/dL (ref 8–23)
CO2: 26 mmol/L (ref 22–32)
Calcium: 9.2 mg/dL (ref 8.9–10.3)
Chloride: 107 mmol/L (ref 98–111)
Creatinine: 0.76 mg/dL (ref 0.44–1.00)
GFR, Estimated: >60 mL/min (ref >60)
Glucose, Bld: 83 mg/dL (ref 70–99)
Potassium: 4.7 mmol/L (ref 3.5–5.1)
Sodium: 141 mmol/L (ref 135–145)
Total Bilirubin: 0.4 mg/dL (ref 0.3–1.2)
Total Protein: 6.4 g/dL — ABNORMAL LOW (ref 6.5–8.1)

## 2022-03-20 LAB — CBC WITH DIFFERENTIAL (CANCER CENTER ONLY)
Abs Immature Granulocytes: 0.01 10*3/uL (ref 0.00–0.07)
Basophils Absolute: 0 10*3/uL (ref 0.0–0.1)
Basophils Relative: 1 %
Eosinophils Absolute: 0.2 10*3/uL (ref 0.0–0.5)
Eosinophils Relative: 4 %
HCT: 31.5 % — ABNORMAL LOW (ref 36.0–46.0)
Hemoglobin: 10 g/dL — ABNORMAL LOW (ref 12.0–15.0)
Immature Granulocytes: 0 %
Lymphocytes Relative: 15 %
Lymphs Abs: 0.6 10*3/uL — ABNORMAL LOW (ref 0.7–4.0)
MCH: 31.5 pg (ref 26.0–34.0)
MCHC: 31.7 g/dL (ref 30.0–36.0)
MCV: 99.4 fL (ref 80.0–100.0)
Monocytes Absolute: 0.6 10*3/uL (ref 0.1–1.0)
Monocytes Relative: 16 %
Neutro Abs: 2.6 10*3/uL (ref 1.7–7.7)
Neutrophils Relative %: 64 %
Platelet Count: 139 10*3/uL — ABNORMAL LOW (ref 150–400)
RBC: 3.17 MIL/uL — ABNORMAL LOW (ref 3.87–5.11)
RDW: 14.6 % (ref 11.5–15.5)
WBC Count: 4 10*3/uL (ref 4.0–10.5)
nRBC: 0 % (ref 0.0–0.2)

## 2022-03-20 LAB — CEA (ACCESS): CEA (CHCC): 38.72 ng/mL — ABNORMAL HIGH (ref 0.00–5.00)

## 2022-03-20 LAB — MAGNESIUM: Magnesium: 1.7 mg/dL (ref 1.7–2.4)

## 2022-03-20 MED ORDER — SODIUM CHLORIDE 0.9 % IV SOLN: Freq: Once | INTRAVENOUS | Status: AC

## 2022-03-20 MED ORDER — SODIUM CHLORIDE 0.9 % IV SOLN
6.0000 mg/kg | Freq: Once | INTRAVENOUS | Status: AC
  Administered 2022-03-20: 400 mg via INTRAVENOUS
  Filled 2022-03-20: qty 20

## 2022-03-20 MED ORDER — HEPARIN SOD (PORK) LOCK FLUSH 100 UNIT/ML IV SOLN
500.0000 [IU] | Freq: Once | INTRAVENOUS | Status: AC | PRN
  Administered 2022-03-20: 500 [IU]

## 2022-03-20 MED ORDER — SODIUM CHLORIDE 0.9% FLUSH
10.0000 mL | INTRAVENOUS | Status: DC | PRN
  Administered 2022-03-20: 10 mL

## 2022-03-20 NOTE — Progress Notes (Signed)
[facility] OFFICE PROGRESS NOTE   Diagnosis: Colon cancer  INTERVAL HISTORY:   Stacy Burns returns as scheduled.  She completed a cycle of panitumumab on 03/06/2022.  She continues encorafenib.  She has developed cracking around the bilateral great toenail.  The skin is dry.  No diarrhea.  Right eye vision has improved following cataract surgery.  Objective:  Vital signs in last 24 hours:  Blood pressure (!) 112/58, pulse 65, temperature 98.1 F (36.7 C), temperature source Oral, resp. rate 20, height _0  (1.702 m), weight 133 lb 9.6 oz (60.6 kg), SpO2 100 %.    HEENT: No thrush or ulcers Resp: Lungs clear bilaterally Cardio: Regular rate and rhythm GI: No hepatosplenomegaly, nontender Vascular: No leg edema  Skin: Dryness over the trunk, paronychia at the great toe bilaterally, dryness at the palmar surface of the fingers  Portacath/PICC-without erythema  Lab Results:  Lab Results  Component Value Date   WBC 4.0 03/20/2022   HGB 10.0 (L) 03/20/2022   HCT 31.5 (L) 03/20/2022   MCV 99.4 03/20/2022   PLT 139 (L) 03/20/2022   NEUTROABS 2.6 03/20/2022    CMP  Lab Results  Component Value Date   NA 141 03/20/2022   K 4.7 03/20/2022   CL 107 03/20/2022   CO2 26 03/20/2022   GLUCOSE 83 03/20/2022   BUN 18 03/20/2022   CREATININE 0.76 03/20/2022   CALCIUM 9.2 03/20/2022   PROT 6.4 (L) 03/20/2022   ALBUMIN 3.6 03/20/2022   AST 17 03/20/2022   ALT 12 03/20/2022   ALKPHOS 126 03/20/2022   BILITOT 0.4 03/20/2022   GFRNONAA >60 03/20/2022   GFRAA >60 05/06/2020    Lab Results  Component Value Date   CEA1 54.57 (H) 12/30/2020   CEA 73.06 (H) 03/06/2022     Medications: I have reviewed the patient's current medications.   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  Cirrhosis.  Innumerable liver lesions. Colonoscopy  12/18/2019-ulcerated partially obstructing large mass in the mid ascending colon.  Biopsy invasive adenocarcinoma; preserved expression major MMR proteins Upper endoscopy 12/18/2019-normal esophagus, stomach, examined duodenum CEA 12/19/2019-8,923 Biopsy liver lesion 12/23/2019-adenocarcinoma Foundation 1-K-ras wild-type; tumor mutation burden greater than or equal to 10; microsatellite stable; BRAF V600E Cycle 1 FOLFOX 01/01/2020 Cycle 2 FOLFOX 01/15/2020  Cycle 3 FOLFOX 01/29/2020, Udenyca added Cycle 4 FOLFOX 02/12/2020, Udenyca held Cycle 5 FOLFOX 02/26/2020, Udenyca Cycle 6 FOLFOX 03/11/2020, Udenyca held CT abdomen/pelvis 03/16/2020-stable diffuse liver lesions, stable strictured appearing ascending colon, possible pericolonic implant, vague left lower lobe nodule Cycle 1 FOLFIRI/Avastin 04/06/2020 Cycle 2 FOLFIRI/Avastin 04/21/2020 Cycle 3 FOLFIRI/Avastin 05/06/2020 Cycle 4 FOLFIRI/Avastin 05/20/2020 Cycle 5 FOLFIRI/Avastin 06/03/2020 CTs 06/15/2020-mild to moderate improvement in hepatic metastasis.  No new or progressive disease.  Narrowing within the proximal ascending colon with upstream mild cecal and terminal ileum dilatation, similar. Cycle 6 FOLFIRI/Avastin 06/17/2020 Cycle 7 FOLFIRI/Avastin 07/01/2020 Cycle 8 FOLFIRI/Avastin 07/15/2020 Cycle 9 FOLFIRI/Avastin 07/29/2020-5-FU bolus eliminated, 5-FU infusion and irinotecan dose reduced Cycle 10 FOLFIRI/Avastin 08/12/2020 CTs 08/23/2020- mild residual wall thickening involving the cecum/ascending colon.  Numerous liver metastases improved. Cycle 11 FOLFIRI/Avastin 08/25/2020 Cycle 12 FOLFIRI/Avastin 09/16/2020 Cycle 13 FOLFIRI/Avastin 10/07/2020 Cycle 14 FOLFIRI/Avastin 10/28/2020 Cycle 15 FOLFIRI/Avastin 11/18/2020 CT abdomen/pelvis 12/06/2020-mild improvement in multifocal hepatic metastases, wall thickening at the ascending colon with pericolonic inflammatory changes Cycle 16 FOLFIRI/Avastin 12/09/2020 Cycle 17 FOLFIRI/Avastin 12/30/2020 Cycle 18  FOLFIRI/Avastin 01/20/2021 Cycle 19 FOLFIRI/Avastin 02/10/2021 Cycle 20  FOLFIRI/Avastin 03/03/2021 Cycle 21 FOLFIRI/Avastin 03/24/2021 Cycle 22 FOLFIRI/Avastin 04/14/2021 CTs 05/04/2021-slight improvement in hepatic metastatic disease. Cycle 23 FOLFIRI/Avastin 05/05/2021 Cycle 24 FOLFIRI/Avastin 05/25/2021 Cycle 25 FOLFIRI/Avastin 06/15/2021 Cycle 26 FOLFIRI/Avastin 07/13/2021 Cycle 27 FOLFIRI/Avastin 08/02/2021 CTs 08/30/2021-majority of liver lesions are unchanged in size, 1 lesion adjacent to the gallbladder has increased, no adenopathy, persistent pericolonic stranding and peritoneal thickening at the cecum Progressive rise in CEA 08/31/2021-treatment changed to Xeloda/bevacizumab, began [place] 09/22/2021 CEA stable 11/03/2021 Xeloda/bevacizumab continued CTs 01/10/2022-increase in size of some of the liver lesions, others are stable, new tiny right lower lobe nodule, increased dilation of distal small bowel with increased wall thickening at the terminal ileum and ileocecal valve with 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, stable bilateral paracolic gutter peritoneal thickening Panitumumab/ Encorafenib 01/12/2022 (encorafenib 01/19/2022) Panitumumab 02/02/2022 Panitumumab 02/20/2022 Panitumumab 03/06/2022 Panitumumab 03/20/2022 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  She is tolerating the panitumumab/Encorafenib well.  She has mild changes of paronychia.  She will use a moisturizer.  She will complete another treatment panitumumab today.  [name] will be scheduled for restaging CTs on  04/14/2022.  She will return for an office visit in 2 weeks.  [name], MD  03/20/2022  8:55 AM

## 2022-03-20 NOTE — Patient Instructions (Signed)

## 2022-03-20 NOTE — Progress Notes (Signed)
Patient seen by Dr. Sherrill today ? ?Vitals are within treatment parameters. ? ?Labs reviewed by Dr. Sherrill and are within treatment parameters. ? ?Per physician team, patient is ready for treatment and there are NO modifications to the treatment plan.  ?

## 2022-03-20 NOTE — Patient Instructions (Signed)
[facility]   Discharge Instructions: Thank you for choosing [facility] to provide your oncology and hematology care.   If you have a lab appointment with the [facility], please go directly to the [facility] and check in at the registration area.   Wear comfortable clothing and clothing appropriate for easy access to any Portacath or PICC line.   We strive to give you quality time with your provider. You may need to reschedule your appointment if you arrive late (15 or more minutes).  Arriving late affects you and other patients whose appointments are after yours.  Also, if you miss three or more appointments without notifying the office, you may be dismissed from the clinic at the provider's discretion.      For prescription refill requests, have your pharmacy contact our office and allow 72 hours for refills to be completed.    Today you received the following chemotherapy and/or immunotherapy agents Vectibix.       To help prevent nausea and vomiting after your treatment, we encourage you to take your nausea medication as directed.  BELOW ARE SYMPTOMS THAT SHOULD BE REPORTED IMMEDIATELY: *FEVER GREATER THAN 100.4 F (38 C) OR HIGHER *CHILLS OR SWEATING *NAUSEA AND VOMITING THAT IS NOT CONTROLLED WITH YOUR NAUSEA MEDICATION *UNUSUAL SHORTNESS OF BREATH *UNUSUAL BRUISING OR BLEEDING *URINARY PROBLEMS (pain or burning when urinating, or frequent urination) *BOWEL PROBLEMS (unusual diarrhea, constipation, pain near the anus) TENDERNESS IN MOUTH AND THROAT WITH OR WITHOUT PRESENCE OF ULCERS (sore throat, sores in mouth, or a toothache) UNUSUAL RASH, SWELLING OR PAIN  UNUSUAL VAGINAL DISCHARGE OR ITCHING   Items with * indicate a potential emergency and should be followed up as soon as possible or go to the Emergency Department if any problems should occur.  Please show the CHEMOTHERAPY ALERT CARD or IMMUNOTHERAPY ALERT CARD at check-in to  the Emergency Department and triage nurse.  Should you have questions after your visit or need to cancel or reschedule your appointment, please contact [facility]  Dept: [phone]  and follow the prompts.  Office hours are 8:00 a.m. to 4:30 p.m. Monday - Friday. Please note that voicemails left after 4:00 p.m. may not be returned until the following business day.  We are closed weekends and major holidays. You have access to a nurse at all times for urgent questions. Please call the main number to the clinic Dept: [phone] and follow the prompts.   For any non-urgent questions, you may also contact your provider using MyChart. We now offer e-Visits for anyone 76 and older to request care online for non-urgent symptoms. For details visit [email or web address].   Also download the MyChart app! Go to the app store, search "MyChart", open the app, select [facility], and log in with your MyChart username and password.  Masks are optional in the cancer centers. If you would like for your care team to wear a mask while they are taking care of you, please let them know. For doctor visits, patients may have with them one support person who is at least 76 years old. At this time, visitors are not allowed in the infusion area.  Panitumumab Solution for Injection What is this medication? PANITUMUMAB (pan i TOOM ue mab) is a monoclonal antibody. It is used to treat colorectal cancer. This medicine may be used for other purposes; ask your health care provider or pharmacist if you have questions. COMMON BRAND NAME(S): Vectibix  What should I tell my care team before I take this medication? They need to know if you have any of these conditions: eye disease, vision problems low levels of calcium, magnesium, or potassium in the blood lung or breathing disease, like asthma skin conditions or sensitivity an unusual or allergic reaction to panitumumab, other medicines, foods,  dyes, or preservatives pregnant or trying to get pregnant breast-feeding How should I use this medication? This drug is given as an infusion into a vein. It is administered in a hospital or clinic by a specially trained health care professional. Talk to your pediatrician regarding the use of this medicine in children. Special care may be needed. Overdosage: If you think you have taken too much of this medicine contact a poison control center or emergency room at once. NOTE: This medicine is only for you. Do not share this medicine with others. What if I miss a dose? It is important not to miss your dose. Call your doctor or health care professional if you are unable to keep an appointment. What may interact with this medication? Do not take this medicine with any of the following medications: bevacizumab This list may not describe all possible interactions. Give your health care provider a list of all the medicines, herbs, non-prescription drugs, or dietary supplements you use. Also tell them if you smoke, drink alcohol, or use illegal drugs. Some items may interact with your medicine. What should I watch for while using this medication? Visit your doctor for checks on your progress. This drug may make you feel generally unwell. This is not uncommon, as chemotherapy can affect healthy cells as well as cancer cells. Report any side effects. Continue your course of treatment even though you feel ill unless your doctor tells you to stop. This medicine can make you more sensitive to the sun. Keep out of the sun while receiving this medicine and for 2 months after the last dose. If you cannot avoid being in the sun, wear protective clothing and use sunscreen. Do not use sun lamps or tanning beds/booths. In some cases, you may be given additional medicines to help with side effects. Follow all directions for their use. Call your doctor or health care professional for advice if you get a fever, chills or  sore throat, or other symptoms of a cold or flu. Do not treat yourself. This drug decreases your body's ability to fight infections. Try to avoid being around people who are sick. Avoid taking products that contain aspirin, acetaminophen, ibuprofen, naproxen, or ketoprofen unless instructed by your doctor. These medicines may hide a fever. Do not become pregnant while taking this medicine and for 2 months after the last dose. Women should inform their doctor if they wish to become pregnant or think they might be pregnant. There is a potential for serious side effects to an unborn child. Talk to your health care professional or pharmacist for more information. Do not breast-feed an infant while taking this medicine or for 2 months after the last dose. What side effects may I notice from receiving this medication? Side effects that you should report to your doctor or health care professional as soon as possible: allergic reactions like skin rash, itching or hives, swelling of the face, lips, or tongue breathing problems changes in vision eye pain fast, irregular heartbeat fever, chills mouth sores red spots on the skin redness, blistering, peeling or loosening of the skin, including inside the mouth signs and symptoms of kidney injury like trouble  passing urine or change in the amount of urine signs and symptoms of low blood pressure like dizziness; feeling faint or lightheaded, falls; unusually weak or tired signs of low calcium like fast heartbeat, muscle cramps or muscle pain; pain, tingling, numbness in the hands or feet; seizures signs and symptoms of low magnesium like muscle cramps, pain, or weakness; tremors; seizures; or fast, irregular heartbeat signs and symptoms of low potassium like muscle cramps or muscle pain; chest pain; dizziness; feeling faint or lightheaded, falls; palpitations; breathing problems; or fast, irregular heartbeat swelling of the ankles, feet, hands Side effects that  usually do not require medical attention (report to your doctor or health care professional if they continue or are bothersome): changes in skin like acne, cracks, skin dryness diarrhea eyelash growth headache mouth sores nail changes nausea, vomiting This list may not describe all possible side effects. Call your doctor for medical advice about side effects. You may report side effects to FDA at 1-800-FDA-1088. Where should I keep my medication? This drug is given in a hospital or clinic and will not be stored at home. NOTE: This sheet is a summary. It may not cover all possible information. If you have questions about this medicine, talk to your doctor, pharmacist, or health care provider.  2023 Elsevier/Gold Standard (2016-02-25 00:00:00)

## 2022-03-31 DIAGNOSIS — H2512 Age-related nuclear cataract, left eye: Secondary | ICD-10-CM | POA: Diagnosis not present

## 2022-04-01 ENCOUNTER — Other Ambulatory Visit: Payer: Self-pay | Admitting: Oncology

## 2022-04-03 ENCOUNTER — Inpatient Hospital Stay: Payer: BC Managed Care – PPO

## 2022-04-03 ENCOUNTER — Encounter: Payer: Self-pay | Admitting: *Deleted

## 2022-04-03 ENCOUNTER — Inpatient Hospital Stay (HOSPITAL_BASED_OUTPATIENT_CLINIC_OR_DEPARTMENT_OTHER): Payer: BC Managed Care – PPO | Admitting: Nurse Practitioner

## 2022-04-03 ENCOUNTER — Encounter: Payer: Self-pay | Admitting: Nurse Practitioner

## 2022-04-03 ENCOUNTER — Other Ambulatory Visit: Payer: Self-pay | Admitting: *Deleted

## 2022-04-03 VITALS — BP 144/55 | HR 73

## 2022-04-03 VITALS — BP 134/55 | HR 74 | Temp 98.1°F | Resp 18 | Ht 67.0 in | Wt 137.8 lb

## 2022-04-03 DIAGNOSIS — R21 Rash and other nonspecific skin eruption: Secondary | ICD-10-CM | POA: Diagnosis not present

## 2022-04-03 DIAGNOSIS — C182 Malignant neoplasm of ascending colon: Secondary | ICD-10-CM

## 2022-04-03 DIAGNOSIS — Z86718 Personal history of other venous thrombosis and embolism: Secondary | ICD-10-CM | POA: Diagnosis not present

## 2022-04-03 DIAGNOSIS — K7460 Unspecified cirrhosis of liver: Secondary | ICD-10-CM | POA: Diagnosis not present

## 2022-04-03 DIAGNOSIS — D500 Iron deficiency anemia secondary to blood loss (chronic): Secondary | ICD-10-CM | POA: Diagnosis not present

## 2022-04-03 DIAGNOSIS — Z7901 Long term (current) use of anticoagulants: Secondary | ICD-10-CM | POA: Diagnosis not present

## 2022-04-03 DIAGNOSIS — Z5112 Encounter for antineoplastic immunotherapy: Secondary | ICD-10-CM | POA: Diagnosis not present

## 2022-04-03 DIAGNOSIS — C787 Secondary malignant neoplasm of liver and intrahepatic bile duct: Secondary | ICD-10-CM | POA: Diagnosis not present

## 2022-04-03 LAB — CMP (CANCER CENTER ONLY)
ALT: 12 U/L (ref 0–44)
AST: 18 U/L (ref 15–41)
Albumin: 3.4 g/dL — ABNORMAL LOW (ref 3.5–5.0)
Alkaline Phosphatase: 115 U/L (ref 38–126)
Anion gap: 7 (ref 5–15)
BUN: 31 mg/dL — ABNORMAL HIGH (ref 8–23)
CO2: 22 mmol/L (ref 22–32)
Calcium: 8.5 mg/dL — ABNORMAL LOW (ref 8.9–10.3)
Chloride: 113 mmol/L — ABNORMAL HIGH (ref 98–111)
Creatinine: 0.76 mg/dL (ref 0.44–1.00)
GFR, Estimated: >60 mL/min (ref >60)
Glucose, Bld: 71 mg/dL (ref 70–99)
Potassium: 4.5 mmol/L (ref 3.5–5.1)
Sodium: 142 mmol/L (ref 135–145)
Total Bilirubin: 0.3 mg/dL (ref 0.3–1.2)
Total Protein: 6 g/dL — ABNORMAL LOW (ref 6.5–8.1)

## 2022-04-03 LAB — CBC WITH DIFFERENTIAL (CANCER CENTER ONLY)
Abs Immature Granulocytes: 0.03 10*3/uL (ref 0.00–0.07)
Basophils Absolute: 0 10*3/uL (ref 0.0–0.1)
Basophils Relative: 1 %
Eosinophils Absolute: 0.2 10*3/uL (ref 0.0–0.5)
Eosinophils Relative: 4 %
HCT: 26.9 % — ABNORMAL LOW (ref 36.0–46.0)
Hemoglobin: 8.6 g/dL — ABNORMAL LOW (ref 12.0–15.0)
Immature Granulocytes: 1 %
Lymphocytes Relative: 13 %
Lymphs Abs: 0.6 10*3/uL — ABNORMAL LOW (ref 0.7–4.0)
MCH: 30.5 pg (ref 26.0–34.0)
MCHC: 32 g/dL (ref 30.0–36.0)
MCV: 95.4 fL (ref 80.0–100.0)
Monocytes Absolute: 0.7 10*3/uL (ref 0.1–1.0)
Monocytes Relative: 16 %
Neutro Abs: 3.1 10*3/uL (ref 1.7–7.7)
Neutrophils Relative %: 65 %
Platelet Count: 119 10*3/uL — ABNORMAL LOW (ref 150–400)
RBC: 2.82 MIL/uL — ABNORMAL LOW (ref 3.87–5.11)
RDW: 14.6 % (ref 11.5–15.5)
WBC Count: 4.7 10*3/uL (ref 4.0–10.5)
nRBC: 0 % (ref 0.0–0.2)

## 2022-04-03 LAB — CEA (ACCESS): CEA (CHCC): 17.11 ng/mL — ABNORMAL HIGH (ref 0.00–5.00)

## 2022-04-03 LAB — MAGNESIUM: Magnesium: 1.5 mg/dL — ABNORMAL LOW (ref 1.7–2.4)

## 2022-04-03 MED ORDER — HEPARIN SOD (PORK) LOCK FLUSH 100 UNIT/ML IV SOLN
500.0000 [IU] | Freq: Once | INTRAVENOUS | Status: AC | PRN
  Administered 2022-04-03: 500 [IU]

## 2022-04-03 MED ORDER — SODIUM CHLORIDE 0.9 % IV SOLN
6.0000 mg/kg | Freq: Once | INTRAVENOUS | Status: AC
  Administered 2022-04-03: 400 mg via INTRAVENOUS
  Filled 2022-04-03: qty 20

## 2022-04-03 MED ORDER — SODIUM CHLORIDE 0.9 % IV SOLN: Freq: Once | INTRAVENOUS | Status: AC

## 2022-04-03 MED ORDER — MAGNESIUM SULFATE 2 GM/50ML IV SOLN
2.0000 g | Freq: Once | INTRAVENOUS | Status: AC
  Administered 2022-04-03: 2 g via INTRAVENOUS
  Filled 2022-04-03: qty 50

## 2022-04-03 MED ORDER — SODIUM CHLORIDE 0.9% FLUSH
10.0000 mL | INTRAVENOUS | Status: DC | PRN
  Administered 2022-04-03: 10 mL

## 2022-04-03 NOTE — Progress Notes (Signed)
Patient seen by Ned Card NP today  Vitals are within treatment parameters.  Labs reviewed by Ned Card NP and are not all within treatment parameters. Mg+ =1.5 --OK to proceed.  Per physician team, patient is ready for treatment. Please note that modifications are being made to the treatment plan including Will give 2 grams Mg+ today

## 2022-04-03 NOTE — Progress Notes (Signed)
[facility] OFFICE PROGRESS NOTE   Diagnosis: Colon cancer  INTERVAL HISTORY:   Stacy Burns returns as scheduled.  She continues encorafenib.  She completed a cycle of Panitumumab 03/20/2022.  She denies nausea/vomiting.  No mouth sores.  No diarrhea.  She notes a mild rash on her shoulders.  In general she has dry skin.  She has good appetite.  Energy level varies.  She denies bleeding.  Vision continues to be improved.  She denies pain.  Objective:  Vital signs in last 24 hours:  Blood pressure (!) 134/55, pulse 74, temperature 98.1 F (36.7 C), temperature source Oral, resp. rate 18, height 5' 7" (1.702 m), weight 137 lb 12.8 oz (62.5 kg), SpO2 100 %.    HEENT: No thrush or ulcers. Resp: Lungs clear bilaterally. Cardio: Regular rate and rhythm. GI: Abdomen soft and nontender.  No hepatomegaly. Vascular: Trace to 1+ pitting edema lower leg bilaterally. Skin: Skin in general has a dry appearance.  No significant rash. Port-A-Cath without erythema.   Lab Results:  Lab Results  Component Value Date   WBC 4.7 04/03/2022   HGB 8.6 (L) 04/03/2022   HCT 26.9 (L) 04/03/2022   MCV 95.4 04/03/2022   PLT 119 (L) 04/03/2022   NEUTROABS 3.1 04/03/2022    Imaging:  No results found.  Medications: I have reviewed the patient's current medications.  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  Cirrhosis.  Innumerable liver lesions. Colonoscopy 12/18/2019-ulcerated partially obstructing large mass in the mid ascending colon.  Biopsy invasive adenocarcinoma; preserved expression major MMR proteins Upper endoscopy 12/18/2019-normal esophagus, stomach, examined duodenum CEA 12/19/2019-8,923 Biopsy liver lesion 12/23/2019-adenocarcinoma Foundation 1-K-ras wild-type; tumor mutation burden greater than or equal to 10; microsatellite stable; BRAF V600E Cycle 1  FOLFOX 01/01/2020 Cycle 2 FOLFOX 01/15/2020  Cycle 3 FOLFOX 01/29/2020, Udenyca added Cycle 4 FOLFOX 02/12/2020, Udenyca held Cycle 5 FOLFOX 02/26/2020, Udenyca Cycle 6 FOLFOX 03/11/2020, Udenyca held CT abdomen/pelvis 03/16/2020-stable diffuse liver lesions, stable strictured appearing ascending colon, possible pericolonic implant, vague left lower lobe nodule Cycle 1 FOLFIRI/Avastin 04/06/2020 Cycle 2 FOLFIRI/Avastin 04/21/2020 Cycle 3 FOLFIRI/Avastin 05/06/2020 Cycle 4 FOLFIRI/Avastin 05/20/2020 Cycle 5 FOLFIRI/Avastin 06/03/2020 CTs 06/15/2020-mild to moderate improvement in hepatic metastasis.  No new or progressive disease.  Narrowing within the proximal ascending colon with upstream mild cecal and terminal ileum dilatation, similar. Cycle 6 FOLFIRI/Avastin 06/17/2020 Cycle 7 FOLFIRI/Avastin 07/01/2020 Cycle 8 FOLFIRI/Avastin 07/15/2020 Cycle 9 FOLFIRI/Avastin 07/29/2020-5-FU bolus eliminated, 5-FU infusion and irinotecan dose reduced Cycle 10 FOLFIRI/Avastin 08/12/2020 CTs 08/23/2020- mild residual wall thickening involving the cecum/ascending colon.  Numerous liver metastases improved. Cycle 11 FOLFIRI/Avastin 08/25/2020 Cycle 12 FOLFIRI/Avastin 09/16/2020 Cycle 13 FOLFIRI/Avastin 10/07/2020 Cycle 14 FOLFIRI/Avastin 10/28/2020 Cycle 15 FOLFIRI/Avastin 11/18/2020 CT abdomen/pelvis 12/06/2020-mild improvement in multifocal hepatic metastases, wall thickening at the ascending colon with pericolonic inflammatory changes Cycle 16 FOLFIRI/Avastin 12/09/2020 Cycle 17 FOLFIRI/Avastin 12/30/2020 Cycle 18 FOLFIRI/Avastin 01/20/2021 Cycle 19 FOLFIRI/Avastin 02/10/2021 Cycle 20 FOLFIRI/Avastin 03/03/2021 Cycle 21 FOLFIRI/Avastin 03/24/2021 Cycle 22 FOLFIRI/Avastin 04/14/2021 CTs 05/04/2021-slight improvement in hepatic metastatic disease. Cycle 23 FOLFIRI/Avastin 05/05/2021 Cycle 24 FOLFIRI/Avastin 05/25/2021 Cycle 25 FOLFIRI/Avastin 06/15/2021 Cycle 26 FOLFIRI/Avastin 07/13/2021 Cycle 27 FOLFIRI/Avastin 08/02/2021 CTs  08/30/2021-majority of liver lesions are unchanged in size, 1 lesion adjacent to the gallbladder has increased, no adenopathy, persistent pericolonic stranding and peritoneal thickening at the cecum Progressive rise in CEA 08/31/2021-treatment changed to Xeloda/bevacizumab, began [place] 09/22/2021 CEA stable 11/03/2021 Xeloda/bevacizumab continued CTs 01/10/2022-increase in size  of some of the liver lesions, others are stable, new tiny right lower lobe nodule, increased dilation of distal small bowel with increased wall thickening at the terminal ileum and ileocecal valve with 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, stable bilateral paracolic gutter peritoneal thickening Panitumumab/ Encorafenib 01/12/2022 (encorafenib 01/19/2022) Panitumumab 02/02/2022 Panitumumab 02/20/2022 Panitumumab 03/06/2022 Panitumumab 03/20/2022 Panitumumab 04/03/2022 Anemia secondary to GI blood loss, on oral iron BID  Cirrhosis 12/26/2019-hospital admission for right lower extremity DVT and GI bleed, treated with heparin anticoagulation beginning 12/26/2019, converted to Lovenox at discharge 12/29/2019 Lovenox reduced to 40 mg daily 02/10/2021 secondary to bruising and nosebleeding History of low-grade fever-likely "tumor" fever COVID-19 infection January 2021    Disposition: [name] appears stable.  She continues encorafenib and every 2-week Panitumumab.  CEA 2 weeks ago was improved.  We will follow-up on the CEA from today.  She is scheduled for restaging CTs 04/14/2022.  CBC and chemistry panel reviewed.  Labs adequate to proceed as above.  She has mild hypomagnesemia.  She will receive IV magnesium today.  She will return for follow-up 04/18/2022.  We are available to see her sooner if needed.    Stacy Burns ANP/GNP-BC   04/03/2022  10:07 AM        

## 2022-04-03 NOTE — Patient Instructions (Signed)

## 2022-04-03 NOTE — Patient Instructions (Signed)
[facility]   Discharge Instructions: Thank you for choosing [facility] to provide your oncology and hematology care.   If you have a lab appointment with the [facility], please go directly to the [facility] and check in at the registration area.   Wear comfortable clothing and clothing appropriate for easy access to any Portacath or PICC line.   We strive to give you quality time with your provider. You may need to reschedule your appointment if you arrive late (15 or more minutes).  Arriving late affects you and other patients whose appointments are after yours.  Also, if you miss three or more appointments without notifying the office, you may be dismissed from the clinic at the provider's discretion.      For prescription refill requests, have your pharmacy contact our office and allow 72 hours for refills to be completed.    Today you received the following chemotherapy and/or immunotherapy agents Vectibix.       To help prevent nausea and vomiting after your treatment, we encourage you to take your nausea medication as directed.  BELOW ARE SYMPTOMS THAT SHOULD BE REPORTED IMMEDIATELY: *FEVER GREATER THAN 100.4 F (38 C) OR HIGHER *CHILLS OR SWEATING *NAUSEA AND VOMITING THAT IS NOT CONTROLLED WITH YOUR NAUSEA MEDICATION *UNUSUAL SHORTNESS OF BREATH *UNUSUAL BRUISING OR BLEEDING *URINARY PROBLEMS (pain or burning when urinating, or frequent urination) *BOWEL PROBLEMS (unusual diarrhea, constipation, pain near the anus) TENDERNESS IN MOUTH AND THROAT WITH OR WITHOUT PRESENCE OF ULCERS (sore throat, sores in mouth, or a toothache) UNUSUAL RASH, SWELLING OR PAIN  UNUSUAL VAGINAL DISCHARGE OR ITCHING   Items with * indicate a potential emergency and should be followed up as soon as possible or go to the Emergency Department if any problems should occur.  Please show the CHEMOTHERAPY ALERT CARD or IMMUNOTHERAPY ALERT CARD at check-in to  the Emergency Department and triage nurse.  Should you have questions after your visit or need to cancel or reschedule your appointment, please contact [facility]  Dept: [phone]  and follow the prompts.  Office hours are 8:00 a.m. to 4:30 p.m. Monday - Friday. Please note that voicemails left after 4:00 p.m. may not be returned until the following business day.  We are closed weekends and major holidays. You have access to a nurse at all times for urgent questions. Please call the main number to the clinic Dept: [phone] and follow the prompts.   For any non-urgent questions, you may also contact your provider using MyChart. We now offer e-Visits for anyone 42 and older to request care online for non-urgent symptoms. For details visit [email or web address].   Also download the MyChart app! Go to the app store, search "MyChart", open the app, select [facility], and log in with your MyChart username and password.  Masks are optional in the cancer centers. If you would like for your care team to wear a mask while they are taking care of you, please let them know. For doctor visits, patients may have with them one support person who is at least 76 years old. At this time, visitors are not allowed in the infusion area.  Panitumumab Solution for Injection What is this medication? PANITUMUMAB (pan i TOOM ue mab) is a monoclonal antibody. It is used to treat colorectal cancer. This medicine may be used for other purposes; ask your health care provider or pharmacist if you have questions. COMMON BRAND NAME(S): Vectibix  What should I tell my care team before I take this medication? They need to know if you have any of these conditions: eye disease, vision problems low levels of calcium, magnesium, or potassium in the blood lung or breathing disease, like asthma skin conditions or sensitivity an unusual or allergic reaction to panitumumab, other medicines, foods,  dyes, or preservatives pregnant or trying to get pregnant breast-feeding How should I use this medication? This drug is given as an infusion into a vein. It is administered in a hospital or clinic by a specially trained health care professional. Talk to your pediatrician regarding the use of this medicine in children. Special care may be needed. Overdosage: If you think you have taken too much of this medicine contact a poison control center or emergency room at once. NOTE: This medicine is only for you. Do not share this medicine with others. What if I miss a dose? It is important not to miss your dose. Call your doctor or health care professional if you are unable to keep an appointment. What may interact with this medication? Do not take this medicine with any of the following medications: bevacizumab This list may not describe all possible interactions. Give your health care provider a list of all the medicines, herbs, non-prescription drugs, or dietary supplements you use. Also tell them if you smoke, drink alcohol, or use illegal drugs. Some items may interact with your medicine. What should I watch for while using this medication? Visit your doctor for checks on your progress. This drug may make you feel generally unwell. This is not uncommon, as chemotherapy can affect healthy cells as well as cancer cells. Report any side effects. Continue your course of treatment even though you feel ill unless your doctor tells you to stop. This medicine can make you more sensitive to the sun. Keep out of the sun while receiving this medicine and for 2 months after the last dose. If you cannot avoid being in the sun, wear protective clothing and use sunscreen. Do not use sun lamps or tanning beds/booths. In some cases, you may be given additional medicines to help with side effects. Follow all directions for their use. Call your doctor or health care professional for advice if you get a fever, chills or  sore throat, or other symptoms of a cold or flu. Do not treat yourself. This drug decreases your body's ability to fight infections. Try to avoid being around people who are sick. Avoid taking products that contain aspirin, acetaminophen, ibuprofen, naproxen, or ketoprofen unless instructed by your doctor. These medicines may hide a fever. Do not become pregnant while taking this medicine and for 2 months after the last dose. Women should inform their doctor if they wish to become pregnant or think they might be pregnant. There is a potential for serious side effects to an unborn child. Talk to your health care professional or pharmacist for more information. Do not breast-feed an infant while taking this medicine or for 2 months after the last dose. What side effects may I notice from receiving this medication? Side effects that you should report to your doctor or health care professional as soon as possible: allergic reactions like skin rash, itching or hives, swelling of the face, lips, or tongue breathing problems changes in vision eye pain fast, irregular heartbeat fever, chills mouth sores red spots on the skin redness, blistering, peeling or loosening of the skin, including inside the mouth signs and symptoms of kidney injury like trouble  passing urine or change in the amount of urine signs and symptoms of low blood pressure like dizziness; feeling faint or lightheaded, falls; unusually weak or tired signs of low calcium like fast heartbeat, muscle cramps or muscle pain; pain, tingling, numbness in the hands or feet; seizures signs and symptoms of low magnesium like muscle cramps, pain, or weakness; tremors; seizures; or fast, irregular heartbeat signs and symptoms of low potassium like muscle cramps or muscle pain; chest pain; dizziness; feeling faint or lightheaded, falls; palpitations; breathing problems; or fast, irregular heartbeat swelling of the ankles, feet, hands Side effects that  usually do not require medical attention (report to your doctor or health care professional if they continue or are bothersome): changes in skin like acne, cracks, skin dryness diarrhea eyelash growth headache mouth sores nail changes nausea, vomiting This list may not describe all possible side effects. Call your doctor for medical advice about side effects. You may report side effects to FDA at 1-800-FDA-1088. Where should I keep my medication? This drug is given in a hospital or clinic and will not be stored at home. NOTE: This sheet is a summary. It may not cover all possible information. If you have questions about this medicine, talk to your doctor, pharmacist, or health care provider.  2023 Elsevier/Gold Standard (2016-02-25 00:00:00)  Magnesium Sulfate Injection What is this medication? MAGNESIUM SULFATE (mag NEE zee um SUL fate) prevents and treats low levels of magnesium in your body. It may also be used to prevent and treat seizures during pregnancy in people with high blood pressure disorders, such as preeclampsia or eclampsia. Magnesium plays an important role in maintaining the health of your muscles and nervous system. This medicine may be used for other purposes; ask your health care provider or pharmacist if you have questions. What should I tell my care team before I take this medication? They need to know if you have any of these conditions: Heart disease History of irregular heart beat Kidney disease An unusual or allergic reaction to magnesium sulfate, medications, foods, dyes, or preservatives Pregnant or trying to get pregnant Breast-feeding How should I use this medication? This medication is for infusion into a vein. It is given in a hospital or clinic setting. Talk to your care team about the use of this medication in children. While this medication may be prescribed for selected conditions, precautions do apply. Overdosage: If you think you have taken too much  of this medicine contact a poison control center or emergency room at once. NOTE: This medicine is only for you. Do not share this medicine with others. What if I miss a dose? This does not apply. What may interact with this medication? Certain medications for anxiety or sleep Certain medications for seizures like phenobarbital Digoxin Medications that relax muscles for surgery Narcotic medications for pain This list may not describe all possible interactions. Give your health care provider a list of all the medicines, herbs, non-prescription drugs, or dietary supplements you use. Also tell them if you smoke, drink alcohol, or use illegal drugs. Some items may interact with your medicine. What should I watch for while using this medication? Your condition will be monitored carefully while you are receiving this medication. You may need blood work done while you are receiving this medication. What side effects may I notice from receiving this medication? Side effects that you should report to your care team as soon as possible: Allergic reactions--skin rash, itching, hives, swelling of the face, lips, tongue, or  throat High magnesium level--confusion, drowsiness, facial flushing, redness, sweating, muscle weakness, fast or irregular heartbeat, trouble breathing Low blood pressure--dizziness, feeling faint or lightheaded, blurry vision Side effects that usually do not require medical attention (report to your care team if they continue or are bothersome): Headache Nausea This list may not describe all possible side effects. Call your doctor for medical advice about side effects. You may report side effects to FDA at 1-800-FDA-1088. Where should I keep my medication? This medication is given in a hospital or clinic and will not be stored at home. NOTE: This sheet is a summary. It may not cover all possible information. If you have questions about this medicine, talk to your doctor, pharmacist,  or health care provider.  2023 Elsevier/Gold Standard (2020-10-07 00:00:00)

## 2022-04-05 ENCOUNTER — Other Ambulatory Visit: Payer: Self-pay

## 2022-04-09 ENCOUNTER — Other Ambulatory Visit: Payer: Self-pay | Admitting: Oncology

## 2022-04-09 DIAGNOSIS — C182 Malignant neoplasm of ascending colon: Secondary | ICD-10-CM

## 2022-04-10 ENCOUNTER — Encounter: Payer: Self-pay | Admitting: Oncology

## 2022-04-13 ENCOUNTER — Other Ambulatory Visit: Payer: Self-pay

## 2022-04-14 ENCOUNTER — Ambulatory Visit (HOSPITAL_BASED_OUTPATIENT_CLINIC_OR_DEPARTMENT_OTHER)
Admission: RE | Admit: 2022-04-14 | Discharge: 2022-04-14 | Disposition: A | Discharge status: Home / Self Care | Payer: BC Managed Care – PPO | Source: Ambulatory Visit | Attending: Oncology | Admitting: Oncology

## 2022-04-14 ENCOUNTER — Other Ambulatory Visit: Payer: Self-pay

## 2022-04-14 ENCOUNTER — Inpatient Hospital Stay: Payer: BC Managed Care – PPO | Attending: Oncology

## 2022-04-14 DIAGNOSIS — Z5112 Encounter for antineoplastic immunotherapy: Secondary | ICD-10-CM | POA: Insufficient documentation

## 2022-04-14 DIAGNOSIS — C787 Secondary malignant neoplasm of liver and intrahepatic bile duct: Secondary | ICD-10-CM | POA: Diagnosis not present

## 2022-04-14 DIAGNOSIS — Z95828 Presence of other vascular implants and grafts: Secondary | ICD-10-CM

## 2022-04-14 DIAGNOSIS — C182 Malignant neoplasm of ascending colon: Secondary | ICD-10-CM | POA: Insufficient documentation

## 2022-04-14 DIAGNOSIS — Z7901 Long term (current) use of anticoagulants: Secondary | ICD-10-CM | POA: Insufficient documentation

## 2022-04-14 DIAGNOSIS — C189 Malignant neoplasm of colon, unspecified: Secondary | ICD-10-CM | POA: Diagnosis not present

## 2022-04-14 DIAGNOSIS — D500 Iron deficiency anemia secondary to blood loss (chronic): Secondary | ICD-10-CM | POA: Insufficient documentation

## 2022-04-14 DIAGNOSIS — R911 Solitary pulmonary nodule: Secondary | ICD-10-CM | POA: Diagnosis not present

## 2022-04-14 DIAGNOSIS — Z86718 Personal history of other venous thrombosis and embolism: Secondary | ICD-10-CM | POA: Insufficient documentation

## 2022-04-14 DIAGNOSIS — K7460 Unspecified cirrhosis of liver: Secondary | ICD-10-CM | POA: Insufficient documentation

## 2022-04-14 MED ORDER — IOHEXOL 300 MG/ML  SOLN
100.0000 mL | Freq: Once | INTRAMUSCULAR | Status: AC | PRN
  Administered 2022-04-14: 100 mL via INTRAVENOUS

## 2022-04-14 MED ORDER — SODIUM CHLORIDE 0.9% FLUSH
10.0000 mL | Freq: Once | INTRAVENOUS | Status: AC
  Administered 2022-04-14: 10 mL via INTRAVENOUS

## 2022-04-14 MED ORDER — HEPARIN SOD (PORK) LOCK FLUSH 100 UNIT/ML IV SOLN
500.0000 [IU] | Freq: Once | INTRAVENOUS | Status: AC
  Administered 2022-04-14: 500 [IU] via INTRAVENOUS

## 2022-04-14 NOTE — Addendum Note (Signed)
Addended by: Tedd Sias on: 04/14/2022 09:52 AM   Modules accepted: Orders

## 2022-04-17 ENCOUNTER — Other Ambulatory Visit: Payer: Self-pay | Admitting: Oncology

## 2022-04-18 ENCOUNTER — Inpatient Hospital Stay: Payer: BC Managed Care – PPO

## 2022-04-18 ENCOUNTER — Encounter: Payer: Self-pay | Admitting: *Deleted

## 2022-04-18 ENCOUNTER — Inpatient Hospital Stay (HOSPITAL_BASED_OUTPATIENT_CLINIC_OR_DEPARTMENT_OTHER): Payer: BC Managed Care – PPO | Admitting: Oncology

## 2022-04-18 VITALS — BP 120/46 | HR 60 | Temp 98.1°F | Resp 16 | Ht 67.0 in | Wt 134.0 lb

## 2022-04-18 DIAGNOSIS — C182 Malignant neoplasm of ascending colon: Secondary | ICD-10-CM | POA: Diagnosis not present

## 2022-04-18 DIAGNOSIS — K7460 Unspecified cirrhosis of liver: Secondary | ICD-10-CM | POA: Diagnosis not present

## 2022-04-18 DIAGNOSIS — Z86718 Personal history of other venous thrombosis and embolism: Secondary | ICD-10-CM | POA: Diagnosis not present

## 2022-04-18 DIAGNOSIS — D500 Iron deficiency anemia secondary to blood loss (chronic): Secondary | ICD-10-CM | POA: Diagnosis not present

## 2022-04-18 DIAGNOSIS — C787 Secondary malignant neoplasm of liver and intrahepatic bile duct: Secondary | ICD-10-CM | POA: Diagnosis not present

## 2022-04-18 DIAGNOSIS — Z95828 Presence of other vascular implants and grafts: Secondary | ICD-10-CM

## 2022-04-18 DIAGNOSIS — Z5112 Encounter for antineoplastic immunotherapy: Secondary | ICD-10-CM | POA: Diagnosis present

## 2022-04-18 DIAGNOSIS — Z7901 Long term (current) use of anticoagulants: Secondary | ICD-10-CM | POA: Diagnosis not present

## 2022-04-18 LAB — CBC WITH DIFFERENTIAL (CANCER CENTER ONLY)
Abs Immature Granulocytes: 0.02 10*3/uL (ref 0.00–0.07)
Basophils Absolute: 0 10*3/uL (ref 0.0–0.1)
Basophils Relative: 1 %
Eosinophils Absolute: 0.2 10*3/uL (ref 0.0–0.5)
Eosinophils Relative: 4 %
HCT: 27.8 % — ABNORMAL LOW (ref 36.0–46.0)
Hemoglobin: 8.9 g/dL — ABNORMAL LOW (ref 12.0–15.0)
Immature Granulocytes: 0 %
Lymphocytes Relative: 15 %
Lymphs Abs: 0.7 10*3/uL (ref 0.7–4.0)
MCH: 30 pg (ref 26.0–34.0)
MCHC: 32 g/dL (ref 30.0–36.0)
MCV: 93.6 fL (ref 80.0–100.0)
Monocytes Absolute: 0.7 10*3/uL (ref 0.1–1.0)
Monocytes Relative: 13 %
Neutro Abs: 3.4 10*3/uL (ref 1.7–7.7)
Neutrophils Relative %: 67 %
Platelet Count: 162 10*3/uL (ref 150–400)
RBC: 2.97 MIL/uL — ABNORMAL LOW (ref 3.87–5.11)
RDW: 14.6 % (ref 11.5–15.5)
WBC Count: 5 10*3/uL (ref 4.0–10.5)
nRBC: 0 % (ref 0.0–0.2)

## 2022-04-18 LAB — CMP (CANCER CENTER ONLY)
ALT: 13 U/L (ref 0–44)
AST: 15 U/L (ref 15–41)
Albumin: 3.5 g/dL (ref 3.5–5.0)
Alkaline Phosphatase: 136 U/L — ABNORMAL HIGH (ref 38–126)
Anion gap: 8 (ref 5–15)
BUN: 35 mg/dL — ABNORMAL HIGH (ref 8–23)
CO2: 22 mmol/L (ref 22–32)
Calcium: 8.7 mg/dL — ABNORMAL LOW (ref 8.9–10.3)
Chloride: 110 mmol/L (ref 98–111)
Creatinine: 0.77 mg/dL (ref 0.44–1.00)
GFR, Estimated: >60 mL/min (ref >60)
Glucose, Bld: 71 mg/dL (ref 70–99)
Potassium: 5 mmol/L (ref 3.5–5.1)
Sodium: 140 mmol/L (ref 135–145)
Total Bilirubin: 0.2 mg/dL — ABNORMAL LOW (ref 0.3–1.2)
Total Protein: 6.4 g/dL — ABNORMAL LOW (ref 6.5–8.1)

## 2022-04-18 LAB — MAGNESIUM: Magnesium: 1.6 mg/dL — ABNORMAL LOW (ref 1.7–2.4)

## 2022-04-18 LAB — CEA (ACCESS): CEA (CHCC): 11.79 ng/mL — ABNORMAL HIGH (ref 0.00–5.00)

## 2022-04-18 MED ORDER — HEPARIN SOD (PORK) LOCK FLUSH 100 UNIT/ML IV SOLN
500.0000 [IU] | Freq: Once | INTRAVENOUS | Status: AC | PRN
  Administered 2022-04-18: 500 [IU]

## 2022-04-18 MED ORDER — SODIUM CHLORIDE 0.9 % IV SOLN: Freq: Once | INTRAVENOUS | Status: AC

## 2022-04-18 MED ORDER — MAGNESIUM SULFATE 2 GM/50ML IV SOLN
2.0000 g | Freq: Once | INTRAVENOUS | Status: AC
  Administered 2022-04-18: 2 g via INTRAVENOUS
  Filled 2022-04-18: qty 50

## 2022-04-18 MED ORDER — SODIUM CHLORIDE 0.9 % IV SOLN
6.0000 mg/kg | Freq: Once | INTRAVENOUS | Status: AC
  Administered 2022-04-18: 400 mg via INTRAVENOUS
  Filled 2022-04-18: qty 20

## 2022-04-18 MED ORDER — SODIUM CHLORIDE 0.9% FLUSH
10.0000 mL | Freq: Once | INTRAVENOUS | Status: AC
  Administered 2022-04-18: 10 mL

## 2022-04-18 MED ORDER — SODIUM CHLORIDE 0.9% FLUSH
10.0000 mL | INTRAVENOUS | Status: DC | PRN
  Administered 2022-04-18: 10 mL

## 2022-04-18 NOTE — Patient Instructions (Signed)
[facility]  Discharge Instructions: Thank you for choosing [facility] to provide your oncology and hematology care.   If you have a lab appointment with the [facility], please go directly to the [facility] and check in at the registration area.   Wear comfortable clothing and clothing appropriate for easy access to any Portacath or PICC line.   We strive to give you quality time with your provider. You may need to reschedule your appointment if you arrive late (15 or more minutes).  Arriving late affects you and other patients whose appointments are after yours.  Also, if you miss three or more appointments without notifying the office, you may be dismissed from the clinic at the provider's discretion.      For prescription refill requests, have your pharmacy contact our office and allow 72 hours for refills to be completed.    Today you received the following chemotherapy and/or immunotherapy agents Vectibix      To help prevent nausea and vomiting after your treatment, we encourage you to take your nausea medication as directed.  BELOW ARE SYMPTOMS THAT SHOULD BE REPORTED IMMEDIATELY: *FEVER GREATER THAN 100.4 F (38 C) OR HIGHER *CHILLS OR SWEATING *NAUSEA AND VOMITING THAT IS NOT CONTROLLED WITH YOUR NAUSEA MEDICATION *UNUSUAL SHORTNESS OF BREATH *UNUSUAL BRUISING OR BLEEDING *URINARY PROBLEMS (pain or burning when urinating, or frequent urination) *BOWEL PROBLEMS (unusual diarrhea, constipation, pain near the anus) TENDERNESS IN MOUTH AND THROAT WITH OR WITHOUT PRESENCE OF ULCERS (sore throat, sores in mouth, or a toothache) UNUSUAL RASH, SWELLING OR PAIN  UNUSUAL VAGINAL DISCHARGE OR ITCHING   Items with * indicate a potential emergency and should be followed up as soon as possible or go to the Emergency Department if any problems should occur.  Please show the CHEMOTHERAPY ALERT CARD or IMMUNOTHERAPY ALERT CARD at check-in to the  Emergency Department and triage nurse.  Should you have questions after your visit or need to cancel or reschedule your appointment, please contact [facility]  Dept: [phone]  and follow the prompts.  Office hours are 8:00 a.m. to 4:30 p.m. Monday - Friday. Please note that voicemails left after 4:00 p.m. may not be returned until the following business day.  We are closed weekends and major holidays. You have access to a nurse at all times for urgent questions. Please call the main number to the clinic Dept: [phone] and follow the prompts.   For any non-urgent questions, you may also contact your provider using MyChart. We now offer e-Visits for anyone 27 and older to request care online for non-urgent symptoms. For details visit [email or web address].   Also download the MyChart app! Go to the app store, search "MyChart", open the app, select [facility], and log in with your MyChart username and password.  Masks are optional in the cancer centers. If you would like for your care team to wear a mask while they are taking care of you, please let them know. You may have one support person who is at least 76 years old accompany you for your appointments.

## 2022-04-18 NOTE — Patient Instructions (Signed)

## 2022-04-18 NOTE — Progress Notes (Signed)
Patient seen by Dr. Benay Spice today  Vitals are within treatment parameters.  Labs reviewed by Dr. Benay Spice and are not all within treatment parameters. Mg+ 1.6  Per physician team, patient is ready for treatment. Please note that modifications are being made to the treatment plan including Will receive 2 grams IV Mg+ today

## 2022-04-18 NOTE — Progress Notes (Signed)
[facility] OFFICE PROGRESS NOTE   Diagnosis: Colon cancer   INTERVAL HISTORY:   Ms. Bellissimo returns as scheduled.  She continues treatment with panitumumab and encorafenib.  She reports dry skin period.  No diarrhea.  She continues Lovenox.  No bleeding.  She is having bowel movements.  She reports linear ulcers at the bilateral great toe.  Objective:  Vital signs in last 24 hours:  Blood pressure (!) 120/46, pulse 60, temperature 98.1 F (36.7 C), temperature source Tympanic, resp. rate 16, height $RemoveBe'5\' 7"'oTAtLFBcJ$  (1.702 m), weight 134 lb (60.8 kg), SpO2 100 %.    HEENT: No thrush or ulcers Resp: Lungs clear bilaterally Cardio: Regular rate and rhythm GI: No mass, nontender, no hepatosplenomegaly Vascular: No leg edema   Skin: Healing areas of linear ulceration at the left and right great toe.  The skin is dry.  Superficial flaking over the back  Portacath/PICC-without erythema  Lab Results:  Lab Results  Component Value Date   WBC 5.0 04/18/2022   HGB 8.9 (L) 04/18/2022   HCT 27.8 (L) 04/18/2022   MCV 93.6 04/18/2022   PLT 162 04/18/2022   NEUTROABS 3.4 04/18/2022    CMP  Lab Results  Component Value Date   NA 142 04/03/2022   K 4.5 04/03/2022   CL 113 (H) 04/03/2022   CO2 22 04/03/2022   GLUCOSE 71 04/03/2022   BUN 31 (H) 04/03/2022   CREATININE 0.76 04/03/2022   CALCIUM 8.5 (L) 04/03/2022   PROT 6.0 (L) 04/03/2022   ALBUMIN 3.4 (L) 04/03/2022   AST 18 04/03/2022   ALT 12 04/03/2022   ALKPHOS 115 04/03/2022   BILITOT 0.3 04/03/2022   GFRNONAA >60 04/03/2022   GFRAA >60 05/06/2020    Lab Results  Component Value Date   CEA1 54.57 (H) 12/30/2020   CEA 17.11 (H) 04/03/2022    Lab Results  Component Value Date   INR 1.1 12/26/2019   LABPROT 13.6 12/26/2019    Imaging:  CT CHEST ABDOMEN PELVIS W CONTRAST  Result Date: 04/14/2022 CLINICAL DATA:  Restaging of colon cancer. Known liver metastasis. * Tracking Code: BO * EXAM: CT CHEST,  ABDOMEN, AND PELVIS WITH CONTRAST TECHNIQUE: Multidetector CT imaging of the chest, abdomen and pelvis was performed following the standard protocol during bolus administration of intravenous contrast. RADIATION DOSE REDUCTION: This exam was performed according to the departmental dose-optimization program which includes automated exposure control, adjustment of the mA and/or kV according to patient size and/or use of iterative reconstruction technique. CONTRAST:  164mL OMNIPAQUE IOHEXOL 300 MG/ML  SOLN COMPARISON:  01/10/2022 FINDINGS: CT CHEST FINDINGS Cardiovascular: Aortic atherosclerosis. Normal heart size, without pericardial effusion. Lad coronary artery calcification. No central pulmonary embolism, on this non-dedicated study. Mediastinum/Nodes: No supraclavicular adenopathy. Subcarinal node measures 1.1 cm in 26/2 versus 6 mm on the prior (when remeasured). No hilar adenopathy. Lungs/Pleura: No pleural fluid. Moderate centrilobular and paraseptal emphysema. Biapical pleuroparenchymal scarring. Subtle right upper lobe interstitial thickening and possible nodularity anteriorly on 56/4, new. Bilateral calcified nodules again identified, consistent with old granulomatous disease. The 3 mm right lower lobe pulmonary nodule is not identified. Musculoskeletal: No acute osseous abnormality. CT ABDOMEN PELVIS FINDINGS Hepatobiliary: Irregular hepatic capsule. Index high right hepatic lobe lesion measures 11 mm on 51/2 versus 15 mm on the prior. Index vague subcapsular anterior right hepatic lobe hypoattenuating lesion measures 1.6 x 1.4 cm on 56/2 versus 1.8 x 1.7 cm on the prior. Pericholecystic right liver lobe lesion measures 1.5 x 1.4  cm on 66/2 versus 1.9 x 1.8 cm on the prior exam. No enlarging liver lesions. Normal gallbladder, without biliary ductal dilatation. Pancreas: Normal, without mass or ductal dilatation. Spleen: Splenomegaly at 13.1 x 13.9 x 9.3 cm (volume = 890 cm^3), similar. Adrenals/Urinary  Tract: Normal adrenal glands. Normal kidneys, without hydronephrosis. Normal urinary bladder. Stomach/Bowel: Gastric antral underdistention. Again identified is a transition between mildly dilated terminal ileum and cecum versus normal caliber ascending colon, with transition identified on 84/2. The extent of cecal distention is slightly decreased. The more proximal small bowel is normal in caliber. Vascular/Lymphatic: Advanced aortic and branch vessel atherosclerosis. No abdominopelvic adenopathy. Reproductive: Uterine body/fundal 1.5 cm fibroid.  No adnexal mass. Other: No significant free fluid. Resolution of abdominal ascites. Thickening of the right pericolic gutter is decreased including on 87/2. No well-defined omental/peritoneal nodules. No free intraperitoneal air. Musculoskeletal: Presumed iatrogenic nodularity about the anterior abdominopelvic wall. Degenerative disc disease at L2-3. IMPRESSION: 1. Response to therapy of hepatic metastasis. 2. Resolution of previously described right lower lobe pulmonary nodule. Possible developing interstitial thickening in the right upper lobe, nonspecific. 3. New borderline subcarinal adenopathy. Cannot exclude isolated nodal metastasis. Recommend attention on follow-up. 4. Improved peritoneal thickening, likely treated metastasis. 5. Resolved ascites with persistent splenomegaly. 6. Underlying hepatic morphology which could represent cirrhosis or pseudo cirrhosis of treated metastasis. 7. Persistent transition between dilated cecum and terminal ileum versus normal caliber ascending colon. Partial obstruction due to residual disease remains a concern. 8. Aortic atherosclerosis (ICD10-I70.0), coronary artery atherosclerosis and emphysema (ICD10-J43.9). Electronically Signed   By: Abigail Miyamoto M.D.   On: 04/14/2022 14:32    Medications: I have reviewed the patient's current medications.   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  Cirrhosis.  Innumerable liver lesions. Colonoscopy 12/18/2019-ulcerated partially obstructing large mass in the mid ascending colon.  Biopsy invasive adenocarcinoma; preserved expression major MMR proteins Upper endoscopy 12/18/2019-normal esophagus, stomach, examined duodenum CEA 12/19/2019-8,923 Biopsy liver lesion 12/23/2019-adenocarcinoma Foundation 1-K-ras wild-type; tumor mutation burden greater than or equal to 10; microsatellite stable; BRAF V600E Cycle 1 FOLFOX 01/01/2020 Cycle 2 FOLFOX 01/15/2020  Cycle 3 FOLFOX 01/29/2020, Udenyca added Cycle 4 FOLFOX 02/12/2020, Udenyca held Cycle 5 FOLFOX 02/26/2020, Udenyca Cycle 6 FOLFOX 03/11/2020, Udenyca held CT abdomen/pelvis 03/16/2020-stable diffuse liver lesions, stable strictured appearing ascending colon, possible pericolonic implant, vague left lower lobe nodule Cycle 1 FOLFIRI/Avastin 04/06/2020 Cycle 2 FOLFIRI/Avastin 04/21/2020 Cycle 3 FOLFIRI/Avastin 05/06/2020 Cycle 4 FOLFIRI/Avastin 05/20/2020 Cycle 5 FOLFIRI/Avastin 06/03/2020 CTs 06/15/2020-mild to moderate improvement in hepatic metastasis.  No new or progressive disease.  Narrowing within the proximal ascending colon with upstream mild cecal and terminal ileum dilatation, similar. Cycle 6 FOLFIRI/Avastin 06/17/2020 Cycle 7 FOLFIRI/Avastin 07/01/2020 Cycle 8 FOLFIRI/Avastin 07/15/2020 Cycle 9 FOLFIRI/Avastin 07/29/2020-5-FU bolus eliminated, 5-FU infusion and irinotecan dose reduced Cycle 10 FOLFIRI/Avastin 08/12/2020 CTs 08/23/2020- mild residual wall thickening involving the cecum/ascending colon.  Numerous liver metastases improved. Cycle 11 FOLFIRI/Avastin 08/25/2020 Cycle 12 FOLFIRI/Avastin 09/16/2020 Cycle 13 FOLFIRI/Avastin 10/07/2020 Cycle 14 FOLFIRI/Avastin 10/28/2020 Cycle 15 FOLFIRI/Avastin 11/18/2020 CT abdomen/pelvis 12/06/2020-mild improvement in multifocal hepatic metastases, wall thickening at  the ascending colon with pericolonic inflammatory changes Cycle 16 FOLFIRI/Avastin 12/09/2020 Cycle 17 FOLFIRI/Avastin 12/30/2020 Cycle 18 FOLFIRI/Avastin 01/20/2021 Cycle 19 FOLFIRI/Avastin 02/10/2021 Cycle 20 FOLFIRI/Avastin 03/03/2021 Cycle 21 FOLFIRI/Avastin 03/24/2021 Cycle 22 FOLFIRI/Avastin 04/14/2021 CTs 05/04/2021-slight improvement in hepatic metastatic disease. Cycle 23 FOLFIRI/Avastin 05/05/2021 Cycle 24 FOLFIRI/Avastin 05/25/2021 Cycle 25 FOLFIRI/Avastin 06/15/2021 Cycle 26 FOLFIRI/Avastin  07/13/2021 Cycle 27 FOLFIRI/Avastin 08/02/2021 CTs 08/30/2021-majority of liver lesions are unchanged in size, 1 lesion adjacent to the gallbladder has increased, no adenopathy, persistent pericolonic stranding and peritoneal thickening at the cecum Progressive rise in CEA 08/31/2021-treatment changed to Xeloda/bevacizumab, began [place] 09/22/2021 CEA stable 11/03/2021 Xeloda/bevacizumab continued CTs 01/10/2022-increase in size of some of the liver lesions, others are stable, new tiny right lower lobe nodule, increased dilation of distal small bowel with increased wall thickening at the terminal ileum and ileocecal valve with 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, stable bilateral paracolic gutter peritoneal thickening Panitumumab/ Encorafenib 01/12/2022 (encorafenib 01/19/2022) Panitumumab 02/02/2022 Panitumumab 02/20/2022 Panitumumab 03/06/2022 Panitumumab 03/20/2022 Panitumumab 04/03/2022 CTs 04/14/2022-decrease size of liver metastases, new borderline subcarinal adenopathy, improved peritoneal thickening, cirrhosis or pseudocirrhosis, persistent dilated cecum Anemia secondary to GI blood loss, on oral iron BID  Cirrhosis 12/26/2019-hospital admission for right lower extremity DVT and GI bleed, treated with heparin anticoagulation beginning 12/26/2019, converted to Lovenox at discharge 12/29/2019 Lovenox reduced to 40 mg daily 02/10/2021 secondary to bruising and nosebleeding 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 Abraha appears stable.  The restaging CTs reveal evidence of a response to therapy.  The liver metastases appear smaller.  The CEA is lower.  I recommend continuing panitumumab and Encorafenib.  I reviewed the CT images with Ms. Owens Shark.  The magnesium is low.  She will receive supplemental IV magnesium today.  Ms. Heatwole will receive panitumumab today and again when she returns for an office visit in 2 weeks.  [name], MD  04/18/2022  8:48 AM

## 2022-04-19 ENCOUNTER — Other Ambulatory Visit: Payer: Self-pay

## 2022-04-21 ENCOUNTER — Other Ambulatory Visit: Payer: Self-pay

## 2022-04-26 ENCOUNTER — Other Ambulatory Visit: Payer: Self-pay

## 2022-05-05 ENCOUNTER — Inpatient Hospital Stay: Payer: BC Managed Care – PPO

## 2022-05-05 ENCOUNTER — Inpatient Hospital Stay (HOSPITAL_BASED_OUTPATIENT_CLINIC_OR_DEPARTMENT_OTHER): Payer: BC Managed Care – PPO | Admitting: Nurse Practitioner

## 2022-05-05 ENCOUNTER — Encounter: Payer: Self-pay | Admitting: Nurse Practitioner

## 2022-05-05 VITALS — BP 148/56 | HR 58 | Temp 98.2°F | Resp 18 | Ht 67.0 in | Wt 137.8 lb

## 2022-05-05 VITALS — BP 141/53 | HR 65

## 2022-05-05 DIAGNOSIS — D649 Anemia, unspecified: Secondary | ICD-10-CM | POA: Diagnosis not present

## 2022-05-05 DIAGNOSIS — C182 Malignant neoplasm of ascending colon: Secondary | ICD-10-CM

## 2022-05-05 DIAGNOSIS — Z5112 Encounter for antineoplastic immunotherapy: Secondary | ICD-10-CM | POA: Diagnosis not present

## 2022-05-05 LAB — CBC WITH DIFFERENTIAL (CANCER CENTER ONLY)
Abs Immature Granulocytes: 0.02 10*3/uL (ref 0.00–0.07)
Basophils Absolute: 0 10*3/uL (ref 0.0–0.1)
Basophils Relative: 1 %
Eosinophils Absolute: 0.2 10*3/uL (ref 0.0–0.5)
Eosinophils Relative: 4 %
HCT: 27.8 % — ABNORMAL LOW (ref 36.0–46.0)
Hemoglobin: 8.9 g/dL — ABNORMAL LOW (ref 12.0–15.0)
Immature Granulocytes: 0 %
Lymphocytes Relative: 18 %
Lymphs Abs: 0.9 10*3/uL (ref 0.7–4.0)
MCH: 29.7 pg (ref 26.0–34.0)
MCHC: 32 g/dL (ref 30.0–36.0)
MCV: 92.7 fL (ref 80.0–100.0)
Monocytes Absolute: 0.6 10*3/uL (ref 0.1–1.0)
Monocytes Relative: 12 %
Neutro Abs: 3.3 10*3/uL (ref 1.7–7.7)
Neutrophils Relative %: 65 %
Platelet Count: 132 10*3/uL — ABNORMAL LOW (ref 150–400)
RBC: 3 MIL/uL — ABNORMAL LOW (ref 3.87–5.11)
RDW: 15.5 % (ref 11.5–15.5)
WBC Count: 5.1 10*3/uL (ref 4.0–10.5)
nRBC: 0 % (ref 0.0–0.2)

## 2022-05-05 LAB — CMP (CANCER CENTER ONLY)
ALT: 11 U/L (ref 0–44)
AST: 14 U/L — ABNORMAL LOW (ref 15–41)
Albumin: 3.4 g/dL — ABNORMAL LOW (ref 3.5–5.0)
Alkaline Phosphatase: 122 U/L (ref 38–126)
Anion gap: 9 (ref 5–15)
BUN: 26 mg/dL — ABNORMAL HIGH (ref 8–23)
CO2: 22 mmol/L (ref 22–32)
Calcium: 8.7 mg/dL — ABNORMAL LOW (ref 8.9–10.3)
Chloride: 107 mmol/L (ref 98–111)
Creatinine: 0.78 mg/dL (ref 0.44–1.00)
GFR, Estimated: >60 mL/min (ref >60)
Glucose, Bld: 85 mg/dL (ref 70–99)
Potassium: 4.1 mmol/L (ref 3.5–5.1)
Sodium: 138 mmol/L (ref 135–145)
Total Bilirubin: 0.3 mg/dL (ref 0.3–1.2)
Total Protein: 5.9 g/dL — ABNORMAL LOW (ref 6.5–8.1)

## 2022-05-05 LAB — CEA (ACCESS): CEA (CHCC): 9.76 ng/mL — ABNORMAL HIGH (ref 0.00–5.00)

## 2022-05-05 LAB — MAGNESIUM: Magnesium: 1.4 mg/dL — ABNORMAL LOW (ref 1.7–2.4)

## 2022-05-05 MED ORDER — HEPARIN SOD (PORK) LOCK FLUSH 100 UNIT/ML IV SOLN
500.0000 [IU] | Freq: Once | INTRAVENOUS | Status: AC | PRN
  Administered 2022-05-05: 500 [IU]

## 2022-05-05 MED ORDER — SODIUM CHLORIDE 0.9 % IV SOLN
6.0000 mg/kg | Freq: Once | INTRAVENOUS | Status: AC
  Administered 2022-05-05: 400 mg via INTRAVENOUS
  Filled 2022-05-05: qty 20

## 2022-05-05 MED ORDER — OXYCODONE-ACETAMINOPHEN 5-325 MG PO TABS: 1.0000 | ORAL_TABLET | Freq: Three times a day (TID) | ORAL | 0 refills | Status: DC | PRN

## 2022-05-05 MED ORDER — SODIUM CHLORIDE 0.9 % IV SOLN: Freq: Once | INTRAVENOUS | Status: AC

## 2022-05-05 MED ORDER — MAGNESIUM SULFATE 4 GM/100ML IV SOLN
4.0000 g | Freq: Once | INTRAVENOUS | Status: AC
  Administered 2022-05-05: 4 g via INTRAVENOUS
  Filled 2022-05-05: qty 100

## 2022-05-05 MED ORDER — ALBUTEROL SULFATE HFA 108 (90 BASE) MCG/ACT IN AERS: INHALATION_SPRAY | RESPIRATORY_TRACT | 2 refills | Status: DC

## 2022-05-05 MED ORDER — SODIUM CHLORIDE 0.9% FLUSH
10.0000 mL | INTRAVENOUS | Status: DC | PRN
  Administered 2022-05-05: 10 mL

## 2022-05-05 MED ORDER — BRAFTOVI 75 MG PO CAPS: ORAL_CAPSULE | ORAL | 0 refills | Status: DC

## 2022-05-05 NOTE — Progress Notes (Signed)
   Patient seen by Ned Card NP today  Vitals are within treatment parameters.  Labs reviewed by Ned Card NP and are not all within treatment parameters. Magnesium is 1.4  Per physician team,  Patient is ready for treatment and there are planning IV magnesium.

## 2022-05-05 NOTE — Patient Instructions (Signed)
[facility]   Discharge Instructions: Thank you for choosing [facility] to provide your oncology and hematology care.   If you have a lab appointment with the [facility], please go directly to the [facility] and check in at the registration area.   Wear comfortable clothing and clothing appropriate for easy access to any Portacath or PICC line.   We strive to give you quality time with your provider. You may need to reschedule your appointment if you arrive late (15 or more minutes).  Arriving late affects you and other patients whose appointments are after yours.  Also, if you miss three or more appointments without notifying the office, you may be dismissed from the clinic at the provider's discretion.      For prescription refill requests, have your pharmacy contact our office and allow 72 hours for refills to be completed.    Today you received the following chemotherapy and/or immunotherapy agents Vectibix.      To help prevent nausea and vomiting after your treatment, we encourage you to take your nausea medication as directed.  BELOW ARE SYMPTOMS THAT SHOULD BE REPORTED IMMEDIATELY: *FEVER GREATER THAN 100.4 F (38 C) OR HIGHER *CHILLS OR SWEATING *NAUSEA AND VOMITING THAT IS NOT CONTROLLED WITH YOUR NAUSEA MEDICATION *UNUSUAL SHORTNESS OF BREATH *UNUSUAL BRUISING OR BLEEDING *URINARY PROBLEMS (pain or burning when urinating, or frequent urination) *BOWEL PROBLEMS (unusual diarrhea, constipation, pain near the anus) TENDERNESS IN MOUTH AND THROAT WITH OR WITHOUT PRESENCE OF ULCERS (sore throat, sores in mouth, or a toothache) UNUSUAL RASH, SWELLING OR PAIN  UNUSUAL VAGINAL DISCHARGE OR ITCHING   Items with * indicate a potential emergency and should be followed up as soon as possible or go to the Emergency Department if any problems should occur.  Please show the CHEMOTHERAPY ALERT CARD or IMMUNOTHERAPY ALERT CARD at check-in to  the Emergency Department and triage nurse.  Should you have questions after your visit or need to cancel or reschedule your appointment, please contact [facility]  Dept: [phone]  and follow the prompts.  Office hours are 8:00 a.m. to 4:30 p.m. Monday - Friday. Please note that voicemails left after 4:00 p.m. may not be returned until the following business day.  We are closed weekends and major holidays. You have access to a nurse at all times for urgent questions. Please call the main number to the clinic Dept: [phone] and follow the prompts.   For any non-urgent questions, you may also contact your provider using MyChart. We now offer e-Visits for anyone 33 and older to request care online for non-urgent symptoms. For details visit [email or web address].   Also download the MyChart app! Go to the app store, search "MyChart", open the app, select [facility], and log in with your MyChart username and password.  Masks are optional in the cancer centers. If you would like for your care team to wear a mask while they are taking care of you, please let them know. You may have one support person who is at least 76 years old accompany you for your appointments.  Panitumumab Injection What is this medication? PANITUMUMAB (pan i TOOM ue mab) treats colorectal cancer. It works by blocking a protein that causes cancer cells to grow and multiply. This helps to slow or stop the spread of cancer cells. It is a monoclonal antibody. This medicine may be used for other purposes; ask your health care provider or pharmacist  if you have questions. COMMON BRAND NAME(S): Vectibix What should I tell my care team before I take this medication? They need to know if you have any of these conditions: Eye disease Low levels of magnesium in the blood Lung disease An unusual or allergic reaction to panitumumab, other medications, foods, dyes, or preservatives Pregnant or trying to  get pregnant Breast-feeding How should I use this medication? This medication is injected into a vein. It is given by your care team in a hospital or clinic setting. Talk to your care team about the use of this medication in children. Special care may be needed. Overdosage: If you think you have taken too much of this medicine contact a poison control center or emergency room at once. NOTE: This medicine is only for you. Do not share this medicine with others. What if I miss a dose? Keep appointments for follow-up doses. It is important not to miss your dose. Call your care team if you are unable to keep an appointment. What may interact with this medication? Bevacizumab This list may not describe all possible interactions. Give your health care provider a list of all the medicines, herbs, non-prescription drugs, or dietary supplements you use. Also tell them if you smoke, drink alcohol, or use illegal drugs. Some items may interact with your medicine. What should I watch for while using this medication? Your condition will be monitored carefully while you are receiving this medication. This medication may make you feel generally unwell. This is not uncommon as chemotherapy can affect healthy cells as well as cancer cells. Report any side effects. Continue your course of treatment even though you feel ill unless your care team tells you to stop. This medication can make you more sensitive to the sun. Keep out of the sun while receiving this medication and for 2 months after stopping therapy. If you cannot avoid being in the sun, wear protective clothing and sunscreen. Do not use sun lamps, tanning beds, or tanning booths. Check with your care team if you have severe diarrhea, nausea, and vomiting or if you sweat a lot. The loss of too much body fluid may make it dangerous for you to take this medication. This medication may cause serious skin reactions. They can happen weeks to months after starting  the medication. Contact your care team right away if you notice fevers or flu-like symptoms with a rash. The rash may be red or purple and then turn into blisters or peeling of the skin. You may also notice a red rash with swelling of the face, lips, or lymph nodes in your neck or under your arms. Talk to your care team if you may be pregnant. Serious birth defects can occur if you take this medication during pregnancy and for 2 months after the last dose. Contraception is recommended while taking this medication and for 2 months after the last dose. Your care team can help you find the option that works for you. Do not breastfeed while taking this medication and for 2 months after the last dose. This medication may cause infertility. Talk to your care team if you are concerned about your fertility. What side effects may I notice from receiving this medication? Side effects that you should report to your care team as soon as possible: Allergic reactions--skin rash, itching, hives, swelling of the face, lips, tongue, or throat Dry cough, shortness of breath or trouble breathing Eye pain, redness, irritation, or discharge with blurry or decreased vision Infusion reactions--chest  pain, shortness of breath or trouble breathing, feeling faint or lightheaded Low magnesium level--muscle pain or cramps, unusual weakness or fatigue, fast or irregular heartbeat, tremors Low potassium level--muscle pain or cramps, unusual weakness or fatigue, fast or irregular heartbeat, constipation Redness, blistering, peeling, or loosening of the skin, including inside the mouth Skin reactions on sun-exposed areas Side effects that usually do not require medical attention (report to your care team if they continue or are bothersome): Change in nail shape, thickness, or color Diarrhea Dry skin Fatigue Nausea Vomiting This list may not describe all possible side effects. Call your doctor for medical advice about side  effects. You may report side effects to FDA at 1-800-FDA-1088. Where should I keep my medication? This medication is given in a hospital or clinic. It will not be stored at home. NOTE: This sheet is a summary. It may not cover all possible information. If you have questions about this medicine, talk to your doctor, pharmacist, or health care provider.  2023 Elsevier/Gold Standard (2021-11-23 00:00:00)  Hypomagnesemia Hypomagnesemia is a condition in which the level of magnesium in the blood is too low. Magnesium is a mineral that is found in many foods. It is used in many different processes in the body. Hypomagnesemia can affect every organ in the body. In severe cases, it can cause life-threatening problems. What are the causes? This condition may be caused by: Not getting enough magnesium in your diet or not having enough healthy foods to eat (malnutrition). Problems with magnesium absorption in the intestines. Dehydration. Excessive use of alcohol. Vomiting. Severe or long-term (chronic) diarrhea. Some medicines, including medicines that make you urinate more often (diuretics). Certain diseases, such as kidney disease, diabetes, celiac disease, and overactive thyroid. What are the signs or symptoms? Symptoms of this condition include: Loss of appetite, nausea, and vomiting. Involuntary shaking or trembling of a body part (tremor). Muscle weakness or tingling in the arms and legs. Sudden tightening of muscles (muscle spasms). Confusion. Psychiatric issues, such as: Depression and irritability. Psychosis. A feeling of fluttering of the heart (palpitations). Seizures. These symptoms are more severe if magnesium levels drop suddenly. How is this diagnosed? This condition may be diagnosed based on: Your symptoms and medical history. A physical exam. Blood and urine tests. How is this treated? Treatment depends on the cause and the severity of the condition. It may be treated  by: Taking a magnesium supplement. This can be taken in pill form. If the condition is severe, magnesium is usually given through an IV. Making changes to your diet. You may be directed to eat foods that have a lot of magnesium, such as green leafy vegetables, peas, beans, and nuts. Not drinking alcohol. If you are struggling not to drink, ask your health care provider for help. Follow these instructions at home: Eating and drinking     Make sure that your diet includes foods with magnesium. Foods that have a lot of magnesium in them include: Green leafy vegetables, such as spinach and broccoli. Beans and peas. Nuts and seeds, such as almonds and sunflower seeds. Whole grains, such as whole grain bread and fortified cereals. Drink fluids that contain salts and minerals (electrolytes), such as sports drinks, when you are active. Do not drink alcohol. General instructions Take over-the-counter and prescription medicines only as told by your health care provider. Take magnesium supplements as directed if your health care provider tells you to take them. Have your magnesium levels monitored as told by your health care provider.  Keep all follow-up visits. This is important. Contact a health care provider if: You get worse instead of better. Your symptoms return. Get help right away if: You develop severe muscle weakness. You have trouble breathing. You feel that your heart is racing. These symptoms may represent a serious problem that is an emergency. Do not wait to see if the symptoms will go away. Get medical help right away. Call your local emergency services (911 in the [place] Do not drive yourself to the hospital. Summary Hypomagnesemia is a condition in which the level of magnesium in the blood is too low. Hypomagnesemia can affect every organ in the body. Treatment may include eating more foods that contain magnesium, taking magnesium supplements, and not drinking alcohol. Have  your magnesium levels monitored as told by your health care provider. This information is not intended to replace advice given to you by your health care provider. Make sure you discuss any questions you have with your health care provider. Document Revised: 12/28/2020 Document Reviewed: 12/28/2020 Elsevier Patient Education  [place].

## 2022-05-05 NOTE — Progress Notes (Signed)
[facility] OFFICE PROGRESS NOTE   Diagnosis: Colon cancer  INTERVAL HISTORY:   Stacy Burns returns as scheduled.  She continues encorafenib and Panitumumab.  She denies nausea/vomiting.  No mouth sores.  No diarrhea.  Stable skin changes on the toes and feet.  She is applying lotion liberally on her feet and uses Neosporin around the toenails.  She has a good appetite.  No abdominal pain.  Stable chronic low back pain.  No bleeding.  Objective:  Vital signs in last 24 hours:  Blood pressure (!) 148/56, pulse (!) 58, temperature 98.2 F (36.8 C), temperature source Oral, resp. rate 18, height $RemoveBe'5\' 7"'qTHzuZmxQ$  (1.702 m), weight 137 lb 12.8 oz (62.5 kg), SpO2 100 %.    HEENT: No thrush or ulcers. Resp: Lungs clear bilaterally. Cardio: Regular rate and rhythm. GI: Abdomen soft and nontender.  Fullness right mid abdomen.  No hepatomegaly. Vascular: Trace pitting edema at the ankles/feet. Neuro: Alert and oriented. Skin: Palms and soles without erythema.  Heels are dry appearing. Port-A-Cath without erythema.  Lab Results:  Lab Results  Component Value Date   WBC 5.1 05/05/2022   HGB 8.9 (L) 05/05/2022   HCT 27.8 (L) 05/05/2022   MCV 92.7 05/05/2022   PLT 132 (L) 05/05/2022   NEUTROABS 3.3 05/05/2022    Imaging:  No results found.  Medications: I have reviewed the patient's current medications.  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  Cirrhosis.  Innumerable liver lesions. Colonoscopy 12/18/2019-ulcerated partially obstructing large mass in the mid ascending colon.  Biopsy invasive adenocarcinoma; preserved expression major MMR proteins Upper endoscopy 12/18/2019-normal esophagus, stomach, examined duodenum CEA 12/19/2019-8,923 Biopsy liver lesion 12/23/2019-adenocarcinoma Foundation 1-K-ras wild-type; tumor mutation burden greater than or equal to 10;  microsatellite stable; BRAF V600E Cycle 1 FOLFOX 01/01/2020 Cycle 2 FOLFOX 01/15/2020  Cycle 3 FOLFOX 01/29/2020, Udenyca added Cycle 4 FOLFOX 02/12/2020, Udenyca held Cycle 5 FOLFOX 02/26/2020, Udenyca Cycle 6 FOLFOX 03/11/2020, Udenyca held CT abdomen/pelvis 03/16/2020-stable diffuse liver lesions, stable strictured appearing ascending colon, possible pericolonic implant, vague left lower lobe nodule Cycle 1 FOLFIRI/Avastin 04/06/2020 Cycle 2 FOLFIRI/Avastin 04/21/2020 Cycle 3 FOLFIRI/Avastin 05/06/2020 Cycle 4 FOLFIRI/Avastin 05/20/2020 Cycle 5 FOLFIRI/Avastin 06/03/2020 CTs 06/15/2020-mild to moderate improvement in hepatic metastasis.  No new or progressive disease.  Narrowing within the proximal ascending colon with upstream mild cecal and terminal ileum dilatation, similar. Cycle 6 FOLFIRI/Avastin 06/17/2020 Cycle 7 FOLFIRI/Avastin 07/01/2020 Cycle 8 FOLFIRI/Avastin 07/15/2020 Cycle 9 FOLFIRI/Avastin 07/29/2020-5-FU bolus eliminated, 5-FU infusion and irinotecan dose reduced Cycle 10 FOLFIRI/Avastin 08/12/2020 CTs 08/23/2020- mild residual wall thickening involving the cecum/ascending colon.  Numerous liver metastases improved. Cycle 11 FOLFIRI/Avastin 08/25/2020 Cycle 12 FOLFIRI/Avastin 09/16/2020 Cycle 13 FOLFIRI/Avastin 10/07/2020 Cycle 14 FOLFIRI/Avastin 10/28/2020 Cycle 15 FOLFIRI/Avastin 11/18/2020 CT abdomen/pelvis 12/06/2020-mild improvement in multifocal hepatic metastases, wall thickening at the ascending colon with pericolonic inflammatory changes Cycle 16 FOLFIRI/Avastin 12/09/2020 Cycle 17 FOLFIRI/Avastin 12/30/2020 Cycle 18 FOLFIRI/Avastin 01/20/2021 Cycle 19 FOLFIRI/Avastin 02/10/2021 Cycle 20 FOLFIRI/Avastin 03/03/2021 Cycle 21 FOLFIRI/Avastin 03/24/2021 Cycle 22 FOLFIRI/Avastin 04/14/2021 CTs 05/04/2021-slight improvement in hepatic metastatic disease. Cycle 23 FOLFIRI/Avastin 05/05/2021 Cycle 24 FOLFIRI/Avastin 05/25/2021 Cycle 25 FOLFIRI/Avastin 06/15/2021 Cycle 26 FOLFIRI/Avastin  07/13/2021 Cycle 27 FOLFIRI/Avastin 08/02/2021 CTs 08/30/2021-majority of liver lesions are unchanged in size, 1 lesion adjacent to the gallbladder has increased, no adenopathy, persistent pericolonic stranding and peritoneal thickening at the cecum Progressive rise in CEA 08/31/2021-treatment changed to Xeloda/bevacizumab, began [place] 09/22/2021 CEA stable 11/03/2021 Xeloda/bevacizumab continued  CTs 01/10/2022-increase in size of some of the liver lesions, others are stable, new tiny right lower lobe nodule, increased dilation of distal small bowel with increased wall thickening at the terminal ileum and ileocecal valve with 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, stable bilateral paracolic gutter peritoneal thickening Panitumumab/ Encorafenib 01/12/2022 (encorafenib 01/19/2022) Panitumumab 02/02/2022 Panitumumab 02/20/2022 Panitumumab 03/06/2022 Panitumumab 03/20/2022 Panitumumab 04/03/2022 CTs 04/14/2022-decrease size of liver metastases, new borderline subcarinal adenopathy, improved peritoneal thickening, cirrhosis or pseudocirrhosis, persistent dilated cecum Encorafenib and Panitumumab continued 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  She is on active treatment with encorafenib and Panitumumab.  She continues to tolerate treatment well.  There is no clinical evidence of disease progression.  Plan to continue the same.  CBC and chemistry panel reviewed.  Labs adequate to proceed as above.  Magnesium remains low.  Plan for IV magnesium today.  She will return for lab, follow-up, Panitumumab in  2 weeks.  She will contact the office in the interim with any problems.    Ned Card ANP/GNP-BC   05/05/2022  8:44 AM

## 2022-05-06 ENCOUNTER — Other Ambulatory Visit: Payer: Self-pay

## 2022-05-07 ENCOUNTER — Other Ambulatory Visit: Payer: Self-pay

## 2022-05-09 ENCOUNTER — Other Ambulatory Visit: Payer: Self-pay | Admitting: Oncology

## 2022-05-09 DIAGNOSIS — C182 Malignant neoplasm of ascending colon: Secondary | ICD-10-CM

## 2022-05-14 ENCOUNTER — Other Ambulatory Visit: Payer: Self-pay | Admitting: Oncology

## 2022-05-19 ENCOUNTER — Inpatient Hospital Stay: Payer: BC Managed Care – PPO | Attending: Oncology

## 2022-05-19 ENCOUNTER — Encounter: Payer: Self-pay | Admitting: *Deleted

## 2022-05-19 ENCOUNTER — Inpatient Hospital Stay (HOSPITAL_BASED_OUTPATIENT_CLINIC_OR_DEPARTMENT_OTHER): Payer: BC Managed Care – PPO | Admitting: Oncology

## 2022-05-19 ENCOUNTER — Inpatient Hospital Stay: Payer: BC Managed Care – PPO

## 2022-05-19 ENCOUNTER — Other Ambulatory Visit: Payer: Self-pay | Admitting: Nurse Practitioner

## 2022-05-19 VITALS — BP 129/56 | HR 67 | Temp 98.1°F | Resp 18 | Ht 67.0 in | Wt 138.6 lb

## 2022-05-19 DIAGNOSIS — K7460 Unspecified cirrhosis of liver: Secondary | ICD-10-CM | POA: Diagnosis not present

## 2022-05-19 DIAGNOSIS — Z5112 Encounter for antineoplastic immunotherapy: Secondary | ICD-10-CM | POA: Insufficient documentation

## 2022-05-19 DIAGNOSIS — D500 Iron deficiency anemia secondary to blood loss (chronic): Secondary | ICD-10-CM | POA: Insufficient documentation

## 2022-05-19 DIAGNOSIS — C787 Secondary malignant neoplasm of liver and intrahepatic bile duct: Secondary | ICD-10-CM | POA: Insufficient documentation

## 2022-05-19 DIAGNOSIS — C182 Malignant neoplasm of ascending colon: Secondary | ICD-10-CM | POA: Diagnosis not present

## 2022-05-19 DIAGNOSIS — Z7901 Long term (current) use of anticoagulants: Secondary | ICD-10-CM | POA: Insufficient documentation

## 2022-05-19 DIAGNOSIS — Z86718 Personal history of other venous thrombosis and embolism: Secondary | ICD-10-CM | POA: Insufficient documentation

## 2022-05-19 DIAGNOSIS — R21 Rash and other nonspecific skin eruption: Secondary | ICD-10-CM | POA: Diagnosis not present

## 2022-05-19 LAB — CBC WITH DIFFERENTIAL (CANCER CENTER ONLY)
Abs Immature Granulocytes: 0.02 10*3/uL (ref 0.00–0.07)
Basophils Absolute: 0 10*3/uL (ref 0.0–0.1)
Basophils Relative: 1 %
Eosinophils Absolute: 0.1 10*3/uL (ref 0.0–0.5)
Eosinophils Relative: 3 %
HCT: 28.3 % — ABNORMAL LOW (ref 36.0–46.0)
Hemoglobin: 9 g/dL — ABNORMAL LOW (ref 12.0–15.0)
Immature Granulocytes: 1 %
Lymphocytes Relative: 18 %
Lymphs Abs: 0.8 10*3/uL (ref 0.7–4.0)
MCH: 29.4 pg (ref 26.0–34.0)
MCHC: 31.8 g/dL (ref 30.0–36.0)
MCV: 92.5 fL (ref 80.0–100.0)
Monocytes Absolute: 0.5 10*3/uL (ref 0.1–1.0)
Monocytes Relative: 13 %
Neutro Abs: 2.7 10*3/uL (ref 1.7–7.7)
Neutrophils Relative %: 64 %
Platelet Count: 121 10*3/uL — ABNORMAL LOW (ref 150–400)
RBC: 3.06 MIL/uL — ABNORMAL LOW (ref 3.87–5.11)
RDW: 15.9 % — ABNORMAL HIGH (ref 11.5–15.5)
WBC Count: 4.2 10*3/uL (ref 4.0–10.5)
nRBC: 0 % (ref 0.0–0.2)

## 2022-05-19 LAB — CMP (CANCER CENTER ONLY)
ALT: 11 U/L (ref 0–44)
AST: 15 U/L (ref 15–41)
Albumin: 3.5 g/dL (ref 3.5–5.0)
Alkaline Phosphatase: 111 U/L (ref 38–126)
Anion gap: 9 (ref 5–15)
BUN: 17 mg/dL (ref 8–23)
CO2: 24 mmol/L (ref 22–32)
Calcium: 8.8 mg/dL — ABNORMAL LOW (ref 8.9–10.3)
Chloride: 107 mmol/L (ref 98–111)
Creatinine: 0.71 mg/dL (ref 0.44–1.00)
GFR, Estimated: >60 mL/min (ref >60)
Glucose, Bld: 72 mg/dL (ref 70–99)
Potassium: 4.3 mmol/L (ref 3.5–5.1)
Sodium: 140 mmol/L (ref 135–145)
Total Bilirubin: 0.3 mg/dL (ref 0.3–1.2)
Total Protein: 6.1 g/dL — ABNORMAL LOW (ref 6.5–8.1)

## 2022-05-19 LAB — MAGNESIUM: Magnesium: 1.5 mg/dL — ABNORMAL LOW (ref 1.7–2.4)

## 2022-05-19 MED ORDER — MAGNESIUM SULFATE 2 GM/50ML IV SOLN
2.0000 g | Freq: Once | INTRAVENOUS | Status: AC
  Administered 2022-05-19: 2 g via INTRAVENOUS
  Filled 2022-05-19: qty 50

## 2022-05-19 MED ORDER — SODIUM CHLORIDE 0.9 % IV SOLN
6.0000 mg/kg | Freq: Once | INTRAVENOUS | Status: AC
  Administered 2022-05-19: 400 mg via INTRAVENOUS
  Filled 2022-05-19: qty 20

## 2022-05-19 MED ORDER — POTASSIUM CHLORIDE ER 10 MEQ PO TBCR: EXTENDED_RELEASE_TABLET | ORAL | 2 refills | Status: DC

## 2022-05-19 MED ORDER — SODIUM CHLORIDE 0.9% FLUSH
10.0000 mL | INTRAVENOUS | Status: DC | PRN
  Administered 2022-05-19: 10 mL

## 2022-05-19 MED ORDER — HEPARIN SOD (PORK) LOCK FLUSH 100 UNIT/ML IV SOLN
500.0000 [IU] | Freq: Once | INTRAVENOUS | Status: AC | PRN
  Administered 2022-05-19: 500 [IU]

## 2022-05-19 MED ORDER — SODIUM CHLORIDE 0.9 % IV SOLN: Freq: Once | INTRAVENOUS | Status: AC

## 2022-05-19 NOTE — Patient Instructions (Signed)
[facility]   Discharge Instructions: Thank you for choosing [facility] to provide your oncology and hematology care.   If you have a lab appointment with the [facility], please go directly to the [facility] and check in at the registration area.   Wear comfortable clothing and clothing appropriate for easy access to any Portacath or PICC line.   We strive to give you quality time with your provider. You may need to reschedule your appointment if you arrive late (15 or more minutes).  Arriving late affects you and other patients whose appointments are after yours.  Also, if you miss three or more appointments without notifying the office, you may be dismissed from the clinic at the provider's discretion.      For prescription refill requests, have your pharmacy contact our office and allow 72 hours for refills to be completed.    Today you received the following chemotherapy and/or immunotherapy agents Panitumumab (VECTIBIX).      To help prevent nausea and vomiting after your treatment, we encourage you to take your nausea medication as directed.  BELOW ARE SYMPTOMS THAT SHOULD BE REPORTED IMMEDIATELY: *FEVER GREATER THAN 100.4 F (38 C) OR HIGHER *CHILLS OR SWEATING *NAUSEA AND VOMITING THAT IS NOT CONTROLLED WITH YOUR NAUSEA MEDICATION *UNUSUAL SHORTNESS OF BREATH *UNUSUAL BRUISING OR BLEEDING *URINARY PROBLEMS (pain or burning when urinating, or frequent urination) *BOWEL PROBLEMS (unusual diarrhea, constipation, pain near the anus) TENDERNESS IN MOUTH AND THROAT WITH OR WITHOUT PRESENCE OF ULCERS (sore throat, sores in mouth, or a toothache) UNUSUAL RASH, SWELLING OR PAIN  UNUSUAL VAGINAL DISCHARGE OR ITCHING   Items with * indicate a potential emergency and should be followed up as soon as possible or go to the Emergency Department if any problems should occur.  Please show the CHEMOTHERAPY ALERT CARD or IMMUNOTHERAPY ALERT CARD at  check-in to the Emergency Department and triage nurse.  Should you have questions after your visit or need to cancel or reschedule your appointment, please contact [facility]  Dept: [phone]  and follow the prompts.  Office hours are 8:00 a.m. to 4:30 p.m. Monday - Friday. Please note that voicemails left after 4:00 p.m. may not be returned until the following business day.  We are closed weekends and major holidays. You have access to a nurse at all times for urgent questions. Please call the main number to the clinic Dept: [phone] and follow the prompts.   For any non-urgent questions, you may also contact your provider using MyChart. We now offer e-Visits for anyone 50 and older to request care online for non-urgent symptoms. For details visit [email or web address].   Also download the MyChart app! Go to the app store, search "MyChart", open the app, select [facility], and log in with your MyChart username and password.  Masks are optional in the cancer centers. If you would like for your care team to wear a mask while they are taking care of you, please let them know. You may have one support person who is at least 76 years old accompany you for your appointments.  Panitumumab Injection What is this medication? PANITUMUMAB (pan i TOOM ue mab) treats colorectal cancer. It works by blocking a protein that causes cancer cells to grow and multiply. This helps to slow or stop the spread of cancer cells. It is a monoclonal antibody. This medicine may be used for other purposes; ask your health care provider or  pharmacist if you have questions. COMMON BRAND NAME(S): Vectibix What should I tell my care team before I take this medication? They need to know if you have any of these conditions: Eye disease Low levels of magnesium in the blood Lung disease An unusual or allergic reaction to panitumumab, other medications, foods, dyes, or preservatives Pregnant or  trying to get pregnant Breast-feeding How should I use this medication? This medication is injected into a vein. It is given by your care team in a hospital or clinic setting. Talk to your care team about the use of this medication in children. Special care may be needed. Overdosage: If you think you have taken too much of this medicine contact a poison control center or emergency room at once. NOTE: This medicine is only for you. Do not share this medicine with others. What if I miss a dose? Keep appointments for follow-up doses. It is important not to miss your dose. Call your care team if you are unable to keep an appointment. What may interact with this medication? Bevacizumab This list may not describe all possible interactions. Give your health care provider a list of all the medicines, herbs, non-prescription drugs, or dietary supplements you use. Also tell them if you smoke, drink alcohol, or use illegal drugs. Some items may interact with your medicine. What should I watch for while using this medication? Your condition will be monitored carefully while you are receiving this medication. This medication may make you feel generally unwell. This is not uncommon as chemotherapy can affect healthy cells as well as cancer cells. Report any side effects. Continue your course of treatment even though you feel ill unless your care team tells you to stop. This medication can make you more sensitive to the sun. Keep out of the sun while receiving this medication and for 2 months after stopping therapy. If you cannot avoid being in the sun, wear protective clothing and sunscreen. Do not use sun lamps, tanning beds, or tanning booths. Check with your care team if you have severe diarrhea, nausea, and vomiting or if you sweat a lot. The loss of too much body fluid may make it dangerous for you to take this medication. This medication may cause serious skin reactions. They can happen weeks to months  after starting the medication. Contact your care team right away if you notice fevers or flu-like symptoms with a rash. The rash may be red or purple and then turn into blisters or peeling of the skin. You may also notice a red rash with swelling of the face, lips, or lymph nodes in your neck or under your arms. Talk to your care team if you may be pregnant. Serious birth defects can occur if you take this medication during pregnancy and for 2 months after the last dose. Contraception is recommended while taking this medication and for 2 months after the last dose. Your care team can help you find the option that works for you. Do not breastfeed while taking this medication and for 2 months after the last dose. This medication may cause infertility. Talk to your care team if you are concerned about your fertility. What side effects may I notice from receiving this medication? Side effects that you should report to your care team as soon as possible: Allergic reactions--skin rash, itching, hives, swelling of the face, lips, tongue, or throat Dry cough, shortness of breath or trouble breathing Eye pain, redness, irritation, or discharge with blurry or decreased vision Infusion  reactions--chest pain, shortness of breath or trouble breathing, feeling faint or lightheaded Low magnesium level--muscle pain or cramps, unusual weakness or fatigue, fast or irregular heartbeat, tremors Low potassium level--muscle pain or cramps, unusual weakness or fatigue, fast or irregular heartbeat, constipation Redness, blistering, peeling, or loosening of the skin, including inside the mouth Skin reactions on sun-exposed areas Side effects that usually do not require medical attention (report to your care team if they continue or are bothersome): Change in nail shape, thickness, or color Diarrhea Dry skin Fatigue Nausea Vomiting This list may not describe all possible side effects. Call your doctor for medical advice  about side effects. You may report side effects to FDA at 1-800-FDA-1088. Where should I keep my medication? This medication is given in a hospital or clinic. It will not be stored at home. NOTE: This sheet is a summary. It may not cover all possible information. If you have questions about this medicine, talk to your doctor, pharmacist, or health care provider.  2023 Elsevier/Gold Standard (2021-11-23 00:00:00)  Magnesium Sulfate Injection What is this medication? MAGNESIUM SULFATE (mag NEE zee um SUL fate) prevents and treats low levels of magnesium in your body. It may also be used to prevent and treat seizures during pregnancy in people with high blood pressure disorders, such as preeclampsia or eclampsia. Magnesium plays an important role in maintaining the health of your muscles and nervous system. This medicine may be used for other purposes; ask your health care provider or pharmacist if you have questions. What should I tell my care team before I take this medication? They need to know if you have any of these conditions: Heart disease History of irregular heart beat Kidney disease An unusual or allergic reaction to magnesium sulfate, medications, foods, dyes, or preservatives Pregnant or trying to get pregnant Breast-feeding How should I use this medication? This medication is for infusion into a vein. It is given in a hospital or clinic setting. Talk to your care team about the use of this medication in children. While this medication may be prescribed for selected conditions, precautions do apply. Overdosage: If you think you have taken too much of this medicine contact a poison control center or emergency room at once. NOTE: This medicine is only for you. Do not share this medicine with others. What if I miss a dose? This does not apply. What may interact with this medication? Certain medications for anxiety or sleep Certain medications for seizures like  phenobarbital Digoxin Medications that relax muscles for surgery Narcotic medications for pain This list may not describe all possible interactions. Give your health care provider a list of all the medicines, herbs, non-prescription drugs, or dietary supplements you use. Also tell them if you smoke, drink alcohol, or use illegal drugs. Some items may interact with your medicine. What should I watch for while using this medication? Your condition will be monitored carefully while you are receiving this medication. You may need blood work done while you are receiving this medication. What side effects may I notice from receiving this medication? Side effects that you should report to your care team as soon as possible: Allergic reactions--skin rash, itching, hives, swelling of the face, lips, tongue, or throat High magnesium level--confusion, drowsiness, facial flushing, redness, sweating, muscle weakness, fast or irregular heartbeat, trouble breathing Low blood pressure--dizziness, feeling faint or lightheaded, blurry vision Side effects that usually do not require medical attention (report to your care team if they continue or are bothersome): Headache Nausea  This list may not describe all possible side effects. Call your doctor for medical advice about side effects. You may report side effects to FDA at 1-800-FDA-1088. Where should I keep my medication? This medication is given in a hospital or clinic and will not be stored at home. NOTE: This sheet is a summary. It may not cover all possible information. If you have questions about this medicine, talk to your doctor, pharmacist, or health care provider.  2023 Elsevier/Gold Standard (2020-10-07 00:00:00)

## 2022-05-19 NOTE — Progress Notes (Signed)
[facility] OFFICE PROGRESS NOTE   Diagnosis: Colon cancer  INTERVAL HISTORY:   [name] returns as scheduled.  She continues encorafenib and panitumumab.  No diarrhea.  Mild rash over the face.  No new complaint.  She is working.  No abdominal pain.  Objective:  Vital signs in last 24 hours:  Blood pressure (!) 129/56, pulse 67, temperature 98.1 F (36.7 C), resp. rate 18, height $RemoveBe'5\' 7"'gEsYafosP$  (1.702 m), weight 138 lb 9.6 oz (62.9 kg), SpO2 100 %.    HEENT: No thrush or ulcers Resp: Lungs clear bilaterally Cardio: Regular rate and rhythm GI: No mass, no hepatosplenomegaly, nontender Vascular: No leg edema  Skin: Ecchymoses over the forearms, mild acne type rash over the face  Portacath/PICC-without erythema  Lab Results:  Lab Results  Component Value Date   WBC 4.2 05/19/2022   HGB 9.0 (L) 05/19/2022   HCT 28.3 (L) 05/19/2022   MCV 92.5 05/19/2022   PLT 121 (L) 05/19/2022   NEUTROABS 2.7 05/19/2022    CMP  Lab Results  Component Value Date   NA 140 05/19/2022   K 4.3 05/19/2022   CL 107 05/19/2022   CO2 24 05/19/2022   GLUCOSE 72 05/19/2022   BUN 17 05/19/2022   CREATININE 0.71 05/19/2022   CALCIUM 8.8 (L) 05/19/2022   PROT 6.1 (L) 05/19/2022   ALBUMIN 3.5 05/19/2022   AST 15 05/19/2022   ALT 11 05/19/2022   ALKPHOS 111 05/19/2022   BILITOT 0.3 05/19/2022   GFRNONAA >60 05/19/2022   GFRAA >60 05/06/2020    Lab Results  Component Value Date   CEA1 54.57 (H) 12/30/2020   CEA 9.76 (H) 05/05/2022     Medications: I have reviewed the patient's current medications.   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  Cirrhosis.  Innumerable liver lesions. Colonoscopy 12/18/2019-ulcerated partially obstructing large mass in the mid ascending colon.  Biopsy invasive adenocarcinoma; preserved expression major MMR proteins Upper  endoscopy 12/18/2019-normal esophagus, stomach, examined duodenum CEA 12/19/2019-8,923 Biopsy liver lesion 12/23/2019-adenocarcinoma Foundation 1-K-ras wild-type; tumor mutation burden greater than or equal to 10; microsatellite stable; BRAF V600E Cycle 1 FOLFOX 01/01/2020 Cycle 2 FOLFOX 01/15/2020  Cycle 3 FOLFOX 01/29/2020, Udenyca added Cycle 4 FOLFOX 02/12/2020, Udenyca held Cycle 5 FOLFOX 02/26/2020, Udenyca Cycle 6 FOLFOX 03/11/2020, Udenyca held CT abdomen/pelvis 03/16/2020-stable diffuse liver lesions, stable strictured appearing ascending colon, possible pericolonic implant, vague left lower lobe nodule Cycle 1 FOLFIRI/Avastin 04/06/2020 Cycle 2 FOLFIRI/Avastin 04/21/2020 Cycle 3 FOLFIRI/Avastin 05/06/2020 Cycle 4 FOLFIRI/Avastin 05/20/2020 Cycle 5 FOLFIRI/Avastin 06/03/2020 CTs 06/15/2020-mild to moderate improvement in hepatic metastasis.  No new or progressive disease.  Narrowing within the proximal ascending colon with upstream mild cecal and terminal ileum dilatation, similar. Cycle 6 FOLFIRI/Avastin 06/17/2020 Cycle 7 FOLFIRI/Avastin 07/01/2020 Cycle 8 FOLFIRI/Avastin 07/15/2020 Cycle 9 FOLFIRI/Avastin 07/29/2020-5-FU bolus eliminated, 5-FU infusion and irinotecan dose reduced Cycle 10 FOLFIRI/Avastin 08/12/2020 CTs 08/23/2020- mild residual wall thickening involving the cecum/ascending colon.  Numerous liver metastases improved. Cycle 11 FOLFIRI/Avastin 08/25/2020 Cycle 12 FOLFIRI/Avastin 09/16/2020 Cycle 13 FOLFIRI/Avastin 10/07/2020 Cycle 14 FOLFIRI/Avastin 10/28/2020 Cycle 15 FOLFIRI/Avastin 11/18/2020 CT abdomen/pelvis 12/06/2020-mild improvement in multifocal hepatic metastases, wall thickening at the ascending colon with pericolonic inflammatory changes Cycle 16 FOLFIRI/Avastin 12/09/2020 Cycle 17 FOLFIRI/Avastin 12/30/2020 Cycle 18 FOLFIRI/Avastin 01/20/2021 Cycle 19 FOLFIRI/Avastin 02/10/2021 Cycle 20 FOLFIRI/Avastin 03/03/2021 Cycle 21 FOLFIRI/Avastin 03/24/2021 Cycle 22 FOLFIRI/Avastin  04/14/2021 CTs 05/04/2021-slight improvement in hepatic metastatic disease. Cycle 23 FOLFIRI/Avastin  05/05/2021 Cycle 24 FOLFIRI/Avastin 05/25/2021 Cycle 25 FOLFIRI/Avastin 06/15/2021 Cycle 26 FOLFIRI/Avastin 07/13/2021 Cycle 27 FOLFIRI/Avastin 08/02/2021 CTs 08/30/2021-majority of liver lesions are unchanged in size, 1 lesion adjacent to the gallbladder has increased, no adenopathy, persistent pericolonic stranding and peritoneal thickening at the cecum Progressive rise in CEA 08/31/2021-treatment changed to Xeloda/bevacizumab, began [place] 09/22/2021 CEA stable 11/03/2021 Xeloda/bevacizumab continued CTs 01/10/2022-increase in size of some of the liver lesions, others are stable, new tiny right lower lobe nodule, increased dilation of distal small bowel with increased wall thickening at the terminal ileum and ileocecal valve with 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, stable bilateral paracolic gutter peritoneal thickening Panitumumab/ Encorafenib 01/12/2022 (encorafenib 01/19/2022) Panitumumab 02/02/2022 Panitumumab 02/20/2022 Panitumumab 03/06/2022 Panitumumab 03/20/2022 Panitumumab 04/03/2022 CTs 04/14/2022-decrease size of liver metastases, new borderline subcarinal adenopathy, improved peritoneal thickening, cirrhosis or pseudocirrhosis, persistent dilated cecum Encorafenib and Panitumumab continued Anemia secondary to GI blood loss, on oral iron BID  Cirrhosis 12/26/2019-hospital admission for right lower extremity DVT and GI bleed, treated with heparin anticoagulation beginning 12/26/2019, converted to Lovenox at discharge 12/29/2019 Lovenox reduced to 40 mg daily 02/10/2021 secondary to bruising and nosebleeding History of low-grade fever-likely "tumor" fever COVID-19 infection January 2021       Disposition: Stacy Burns appears stable.  She is tolerating the encorafenib and panitumumab well.  She will continue the current treatment.  She will receive magnesium supplementation today.  The  UA was lower when she was here 2 weeks ago.  [name] will return for an office visit in 2 weeks.  [name], MD  05/19/2022  9:55 AM

## 2022-05-19 NOTE — Progress Notes (Signed)
Patient seen by Dr. Benay Spice today  Vitals are within treatment parameters.  Labs reviewed by Dr. Benay Spice and are not all within treatment parameters. Mg+ low at 1.5  Per physician team, patient is ready for treatment. Please note that modifications are being made to the treatment plan including Please give 2 grams IV Mg+ today--

## 2022-05-20 ENCOUNTER — Other Ambulatory Visit: Payer: Self-pay

## 2022-05-21 ENCOUNTER — Other Ambulatory Visit: Payer: Self-pay

## 2022-05-22 ENCOUNTER — Other Ambulatory Visit: Payer: Self-pay

## 2022-05-27 ENCOUNTER — Other Ambulatory Visit: Payer: Self-pay | Admitting: Oncology

## 2022-05-27 DIAGNOSIS — C182 Malignant neoplasm of ascending colon: Secondary | ICD-10-CM

## 2022-06-02 ENCOUNTER — Inpatient Hospital Stay: Payer: BC Managed Care – PPO

## 2022-06-02 ENCOUNTER — Encounter: Payer: Self-pay | Admitting: Nurse Practitioner

## 2022-06-02 ENCOUNTER — Inpatient Hospital Stay (HOSPITAL_BASED_OUTPATIENT_CLINIC_OR_DEPARTMENT_OTHER): Payer: BC Managed Care – PPO | Admitting: Nurse Practitioner

## 2022-06-02 VITALS — BP 158/68 | HR 70

## 2022-06-02 DIAGNOSIS — D500 Iron deficiency anemia secondary to blood loss (chronic): Secondary | ICD-10-CM | POA: Diagnosis not present

## 2022-06-02 DIAGNOSIS — Z7901 Long term (current) use of anticoagulants: Secondary | ICD-10-CM | POA: Diagnosis not present

## 2022-06-02 DIAGNOSIS — C182 Malignant neoplasm of ascending colon: Secondary | ICD-10-CM

## 2022-06-02 DIAGNOSIS — Z86718 Personal history of other venous thrombosis and embolism: Secondary | ICD-10-CM | POA: Diagnosis not present

## 2022-06-02 DIAGNOSIS — C787 Secondary malignant neoplasm of liver and intrahepatic bile duct: Secondary | ICD-10-CM | POA: Diagnosis not present

## 2022-06-02 DIAGNOSIS — R21 Rash and other nonspecific skin eruption: Secondary | ICD-10-CM | POA: Diagnosis not present

## 2022-06-02 DIAGNOSIS — Z5112 Encounter for antineoplastic immunotherapy: Secondary | ICD-10-CM | POA: Diagnosis not present

## 2022-06-02 DIAGNOSIS — K7460 Unspecified cirrhosis of liver: Secondary | ICD-10-CM | POA: Diagnosis not present

## 2022-06-02 LAB — CMP (CANCER CENTER ONLY)
ALT: 9 U/L (ref 0–44)
AST: 13 U/L — ABNORMAL LOW (ref 15–41)
Albumin: 3.7 g/dL (ref 3.5–5.0)
Alkaline Phosphatase: 89 U/L (ref 38–126)
Anion gap: 8 (ref 5–15)
BUN: 26 mg/dL — ABNORMAL HIGH (ref 8–23)
CO2: 23 mmol/L (ref 22–32)
Calcium: 9 mg/dL (ref 8.9–10.3)
Chloride: 107 mmol/L (ref 98–111)
Creatinine: 0.75 mg/dL (ref 0.44–1.00)
GFR, Estimated: >60 mL/min (ref >60)
Glucose, Bld: 75 mg/dL (ref 70–99)
Potassium: 4.6 mmol/L (ref 3.5–5.1)
Sodium: 138 mmol/L (ref 135–145)
Total Bilirubin: 0.3 mg/dL (ref 0.3–1.2)
Total Protein: 6.1 g/dL — ABNORMAL LOW (ref 6.5–8.1)

## 2022-06-02 LAB — CBC WITH DIFFERENTIAL (CANCER CENTER ONLY)
Abs Immature Granulocytes: 0.01 10*3/uL (ref 0.00–0.07)
Basophils Absolute: 0.1 10*3/uL (ref 0.0–0.1)
Basophils Relative: 1 %
Eosinophils Absolute: 0.1 10*3/uL (ref 0.0–0.5)
Eosinophils Relative: 2 %
HCT: 29.1 % — ABNORMAL LOW (ref 36.0–46.0)
Hemoglobin: 9.3 g/dL — ABNORMAL LOW (ref 12.0–15.0)
Immature Granulocytes: 0 %
Lymphocytes Relative: 21 %
Lymphs Abs: 1.1 10*3/uL (ref 0.7–4.0)
MCH: 29.4 pg (ref 26.0–34.0)
MCHC: 32 g/dL (ref 30.0–36.0)
MCV: 92.1 fL (ref 80.0–100.0)
Monocytes Absolute: 0.8 10*3/uL (ref 0.1–1.0)
Monocytes Relative: 15 %
Neutro Abs: 3.1 10*3/uL (ref 1.7–7.7)
Neutrophils Relative %: 61 %
Platelet Count: 145 10*3/uL — ABNORMAL LOW (ref 150–400)
RBC: 3.16 MIL/uL — ABNORMAL LOW (ref 3.87–5.11)
RDW: 15.9 % — ABNORMAL HIGH (ref 11.5–15.5)
WBC Count: 5.2 10*3/uL (ref 4.0–10.5)
nRBC: 0 % (ref 0.0–0.2)

## 2022-06-02 LAB — MAGNESIUM: Magnesium: 1.5 mg/dL — ABNORMAL LOW (ref 1.7–2.4)

## 2022-06-02 LAB — CEA (ACCESS): CEA (CHCC): 10.61 ng/mL — ABNORMAL HIGH (ref 0.00–5.00)

## 2022-06-02 MED ORDER — SODIUM CHLORIDE 0.9% FLUSH
10.0000 mL | INTRAVENOUS | Status: DC | PRN
  Administered 2022-06-02: 10 mL

## 2022-06-02 MED ORDER — MAGNESIUM SULFATE 4 GM/100ML IV SOLN
4.0000 g | Freq: Once | INTRAVENOUS | Status: AC
  Administered 2022-06-02: 4 g via INTRAVENOUS
  Filled 2022-06-02: qty 100

## 2022-06-02 MED ORDER — SODIUM CHLORIDE 0.9 % IV SOLN
6.0000 mg/kg | Freq: Once | INTRAVENOUS | Status: AC
  Administered 2022-06-02: 400 mg via INTRAVENOUS
  Filled 2022-06-02: qty 20

## 2022-06-02 MED ORDER — HEPARIN SOD (PORK) LOCK FLUSH 100 UNIT/ML IV SOLN
500.0000 [IU] | Freq: Once | INTRAVENOUS | Status: AC | PRN
  Administered 2022-06-02: 500 [IU]

## 2022-06-02 MED ORDER — SODIUM CHLORIDE 0.9 % IV SOLN: Freq: Once | INTRAVENOUS | Status: AC

## 2022-06-02 NOTE — Patient Instructions (Signed)
Stacy Burns   Discharge Instructions: Thank you for choosing [facility] to provide your oncology and hematology care.   If you have a lab appointment with the [facility], please go directly to the [facility] and check in at the registration area.   Wear comfortable clothing and clothing appropriate for easy access to any Portacath or PICC line.   We strive to give you quality time with your provider. You may need to reschedule your appointment if you arrive late (15 or more minutes).  Arriving late affects you and other patients whose appointments are after yours.  Also, if you miss three or more appointments without notifying the office, you may be dismissed from the clinic at the provider's discretion.      For prescription refill requests, have your pharmacy contact our office and allow 72 hours for refills to be completed.    Today you received the following chemotherapy and/or immunotherapy agents Panitumumab (VECTIBIX).      To help prevent nausea and vomiting after your treatment, we encourage you to take your nausea medication as directed.  BELOW ARE SYMPTOMS THAT SHOULD BE REPORTED IMMEDIATELY: *FEVER GREATER THAN 100.4 F (38 C) OR HIGHER *CHILLS OR SWEATING *NAUSEA AND VOMITING THAT IS NOT CONTROLLED WITH YOUR NAUSEA MEDICATION *UNUSUAL SHORTNESS OF BREATH *UNUSUAL BRUISING OR BLEEDING *URINARY PROBLEMS (pain or burning when urinating, or frequent urination) *BOWEL PROBLEMS (unusual diarrhea, constipation, pain near the anus) TENDERNESS IN MOUTH AND THROAT WITH OR WITHOUT PRESENCE OF ULCERS (sore throat, sores in mouth, or a toothache) UNUSUAL RASH, SWELLING OR PAIN  UNUSUAL VAGINAL DISCHARGE OR ITCHING   Items with * indicate a potential emergency and should be followed up as soon as possible or go to the Emergency Department if any problems should occur.  Please show the CHEMOTHERAPY ALERT CARD or IMMUNOTHERAPY ALERT CARD at  check-in to the Emergency Department and triage nurse.  Should you have questions after your visit or need to cancel or reschedule your appointment, please contact [facility]  Dept: [phone]  and follow the prompts.  Office hours are 8:00 a.m. to 4:30 p.m. Monday - Friday. Please note that voicemails left after 4:00 p.m. may not be returned until the following business day.  We are closed weekends and major holidays. You have access to a nurse at all times for urgent questions. Please call the main number to the clinic Dept: [phone] and follow the prompts.   For any non-urgent questions, you may also contact your provider using MyChart. We now offer e-Visits for anyone 50 and older to request care online for non-urgent symptoms. For details visit [email or web address].   Also download the MyChart app! Go to the app store, search "MyChart", open the app, select [facility], and log in with your MyChart username and password.  Masks are optional in the cancer centers. If you would like for your care team to wear a mask while they are taking care of you, please let them know. You may have one support person who is at least 76 years old accompany you for your appointments.  Panitumumab Injection What is this medication? PANITUMUMAB (pan i TOOM ue mab) treats colorectal cancer. It works by blocking a protein that causes cancer cells to grow and multiply. This helps to slow or stop the spread of cancer cells. It is a monoclonal antibody. This medicine may be used for other purposes; ask your health care provider or  pharmacist if you have questions. COMMON BRAND NAME(S): Vectibix What should I tell my care team before I take this medication? They need to know if you have any of these conditions: Eye disease Low levels of magnesium in the blood Lung disease An unusual or allergic reaction to panitumumab, other medications, foods, dyes, or preservatives Pregnant or  trying to get pregnant Breast-feeding How should I use this medication? This medication is injected into a vein. It is given by your care team in a hospital or clinic setting. Talk to your care team about the use of this medication in children. Special care may be needed. Overdosage: If you think you have taken too much of this medicine contact a poison control center or emergency room at once. NOTE: This medicine is only for you. Do not share this medicine with others. What if I miss a dose? Keep appointments for follow-up doses. It is important not to miss your dose. Call your care team if you are unable to keep an appointment. What may interact with this medication? Bevacizumab This list may not describe all possible interactions. Give your health care provider a list of all the medicines, herbs, non-prescription drugs, or dietary supplements you use. Also tell them if you smoke, drink alcohol, or use illegal drugs. Some items may interact with your medicine. What should I watch for while using this medication? Your condition will be monitored carefully while you are receiving this medication. This medication may make you feel generally unwell. This is not uncommon as chemotherapy can affect healthy cells as well as cancer cells. Report any side effects. Continue your course of treatment even though you feel ill unless your care team tells you to stop. This medication can make you more sensitive to the sun. Keep out of the sun while receiving this medication and for 2 months after stopping therapy. If you cannot avoid being in the sun, wear protective clothing and sunscreen. Do not use sun lamps, tanning beds, or tanning booths. Check with your care team if you have severe diarrhea, nausea, and vomiting or if you sweat a lot. The loss of too much body fluid may make it dangerous for you to take this medication. This medication may cause serious skin reactions. They can happen weeks to months  after starting the medication. Contact your care team right away if you notice fevers or flu-like symptoms with a rash. The rash may be red or purple and then turn into blisters or peeling of the skin. You may also notice a red rash with swelling of the face, lips, or lymph nodes in your neck or under your arms. Talk to your care team if you may be pregnant. Serious birth defects can occur if you take this medication during pregnancy and for 2 months after the last dose. Contraception is recommended while taking this medication and for 2 months after the last dose. Your care team can help you find the option that works for you. Do not breastfeed while taking this medication and for 2 months after the last dose. This medication may cause infertility. Talk to your care team if you are concerned about your fertility. What side effects may I notice from receiving this medication? Side effects that you should report to your care team as soon as possible: Allergic reactions--skin rash, itching, hives, swelling of the face, lips, tongue, or throat Dry cough, shortness of breath or trouble breathing Eye pain, redness, irritation, or discharge with blurry or decreased vision Infusion  reactions--chest pain, shortness of breath or trouble breathing, feeling faint or lightheaded Low magnesium level--muscle pain or cramps, unusual weakness or fatigue, fast or irregular heartbeat, tremors Low potassium level--muscle pain or cramps, unusual weakness or fatigue, fast or irregular heartbeat, constipation Redness, blistering, peeling, or loosening of the skin, including inside the mouth Skin reactions on sun-exposed areas Side effects that usually do not require medical attention (report to your care team if they continue or are bothersome): Change in nail shape, thickness, or color Diarrhea Dry skin Fatigue Nausea Vomiting This list may not describe all possible side effects. Call your doctor for medical advice  about side effects. You may report side effects to FDA at 1-800-FDA-1088. Where should I keep my medication? This medication is given in a hospital or clinic. It will not be stored at home. NOTE: This sheet is a summary. It may not cover all possible information. If you have questions about this medicine, talk to your doctor, pharmacist, or health care provider.  2023 Elsevier/Gold Standard (2021-11-23 00:00:00)  Magnesium Sulfate Injection What is this medication? MAGNESIUM SULFATE (mag NEE zee um SUL fate) prevents and treats low levels of magnesium in your body. It may also be used to prevent and treat seizures during pregnancy in people with high blood pressure disorders, such as preeclampsia or eclampsia. Magnesium plays an important role in maintaining the health of your muscles and nervous system. This medicine may be used for other purposes; ask your health care provider or pharmacist if you have questions. What should I tell my care team before I take this medication? They need to know if you have any of these conditions: Heart disease History of irregular heart beat Kidney disease An unusual or allergic reaction to magnesium sulfate, medications, foods, dyes, or preservatives Pregnant or trying to get pregnant Breast-feeding How should I use this medication? This medication is for infusion into a vein. It is given in a hospital or clinic setting. Talk to your care team about the use of this medication in children. While this medication may be prescribed for selected conditions, precautions do apply. Overdosage: If you think you have taken too much of this medicine contact a poison control center or emergency room at once. NOTE: This medicine is only for you. Do not share this medicine with others. What if I miss a dose? This does not apply. What may interact with this medication? Certain medications for anxiety or sleep Certain medications for seizures like  phenobarbital Digoxin Medications that relax muscles for surgery Narcotic medications for pain This list may not describe all possible interactions. Give your health care provider a list of all the medicines, herbs, non-prescription drugs, or dietary supplements you use. Also tell them if you smoke, drink alcohol, or use illegal drugs. Some items may interact with your medicine. What should I watch for while using this medication? Your condition will be monitored carefully while you are receiving this medication. You may need blood work done while you are receiving this medication. What side effects may I notice from receiving this medication? Side effects that you should report to your care team as soon as possible: Allergic reactions--skin rash, itching, hives, swelling of the face, lips, tongue, or throat High magnesium level--confusion, drowsiness, facial flushing, redness, sweating, muscle weakness, fast or irregular heartbeat, trouble breathing Low blood pressure--dizziness, feeling faint or lightheaded, blurry vision Side effects that usually do not require medical attention (report to your care team if they continue or are bothersome): Headache Nausea  This list may not describe all possible side effects. Call your doctor for medical advice about side effects. You may report side effects to FDA at 1-800-FDA-1088. Where should I keep my medication? This medication is given in a hospital or clinic and will not be stored at home. NOTE: This sheet is a summary. It may not cover all possible information. If you have questions about this medicine, talk to your doctor, pharmacist, or health care provider.  2023 Elsevier/Gold Standard (2020-10-07 00:00:00)

## 2022-06-02 NOTE — Progress Notes (Signed)
Patient seen by Ned Card NP today  Vitals are within treatment parameters.  Labs reviewed by Ned Card NP and are not all within treatment parameters. Magnesium is 1.5.    Per physician team, patient is ready for treatment. Please note that modifications are being made to the treatment plan including adding magnesium

## 2022-06-02 NOTE — Progress Notes (Signed)
[facility] OFFICE PROGRESS NOTE   Diagnosis: Colon cancer  INTERVAL HISTORY:   Stacy Burns returns as scheduled.  She continues encorafenib and Panitumumab.  She feels well.  No rash.  No diarrhea.  No nausea or vomiting.  She denies pain.  No bleeding.  She has a good appetite.  She is gaining weight.  Objective:  Vital signs in last 24 hours:  Blood pressure (!) 150/58, pulse 65, temperature 98.2 F (36.8 C), temperature source Oral, resp. rate 18, height $RemoveBe'5\' 7"'hlOiQDzeT$  (1.702 m), weight 140 lb 3.2 oz (63.6 kg), SpO2 100 %.    HEENT: No thrush or ulcers. Resp: Lungs clear bilaterally. Cardio: Regular rate and rhythm. GI: Abdomen soft and nontender.  No mass.  No hepatosplenomegaly. Vascular: No leg edema. Skin: Ecchymoses over the forearms.  Mild acne type rash over the face. Port-A-Cath without erythema.   Lab Results:  Lab Results  Component Value Date   WBC 5.2 06/02/2022   HGB 9.3 (L) 06/02/2022   HCT 29.1 (L) 06/02/2022   MCV 92.1 06/02/2022   PLT 145 (L) 06/02/2022   NEUTROABS 3.1 06/02/2022    Imaging:  No results found.  Medications: I have reviewed the patient's current medications.  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  Cirrhosis.  Innumerable liver lesions. Colonoscopy 12/18/2019-ulcerated partially obstructing large mass in the mid ascending colon.  Biopsy invasive adenocarcinoma; preserved expression major MMR proteins Upper endoscopy 12/18/2019-normal esophagus, stomach, examined duodenum CEA 12/19/2019-8,923 Biopsy liver lesion 12/23/2019-adenocarcinoma Foundation 1-K-ras wild-type; tumor mutation burden greater than or equal to 10; microsatellite stable; BRAF V600E Cycle 1 FOLFOX 01/01/2020 Cycle 2 FOLFOX 01/15/2020  Cycle 3 FOLFOX 01/29/2020, Udenyca added Cycle 4 FOLFOX 02/12/2020, Udenyca held Cycle 5 FOLFOX 02/26/2020,  Udenyca Cycle 6 FOLFOX 03/11/2020, Udenyca held CT abdomen/pelvis 03/16/2020-stable diffuse liver lesions, stable strictured appearing ascending colon, possible pericolonic implant, vague left lower lobe nodule Cycle 1 FOLFIRI/Avastin 04/06/2020 Cycle 2 FOLFIRI/Avastin 04/21/2020 Cycle 3 FOLFIRI/Avastin 05/06/2020 Cycle 4 FOLFIRI/Avastin 05/20/2020 Cycle 5 FOLFIRI/Avastin 06/03/2020 CTs 06/15/2020-mild to moderate improvement in hepatic metastasis.  No new or progressive disease.  Narrowing within the proximal ascending colon with upstream mild cecal and terminal ileum dilatation, similar. Cycle 6 FOLFIRI/Avastin 06/17/2020 Cycle 7 FOLFIRI/Avastin 07/01/2020 Cycle 8 FOLFIRI/Avastin 07/15/2020 Cycle 9 FOLFIRI/Avastin 07/29/2020-5-FU bolus eliminated, 5-FU infusion and irinotecan dose reduced Cycle 10 FOLFIRI/Avastin 08/12/2020 CTs 08/23/2020- mild residual wall thickening involving the cecum/ascending colon.  Numerous liver metastases improved. Cycle 11 FOLFIRI/Avastin 08/25/2020 Cycle 12 FOLFIRI/Avastin 09/16/2020 Cycle 13 FOLFIRI/Avastin 10/07/2020 Cycle 14 FOLFIRI/Avastin 10/28/2020 Cycle 15 FOLFIRI/Avastin 11/18/2020 CT abdomen/pelvis 12/06/2020-mild improvement in multifocal hepatic metastases, wall thickening at the ascending colon with pericolonic inflammatory changes Cycle 16 FOLFIRI/Avastin 12/09/2020 Cycle 17 FOLFIRI/Avastin 12/30/2020 Cycle 18 FOLFIRI/Avastin 01/20/2021 Cycle 19 FOLFIRI/Avastin 02/10/2021 Cycle 20 FOLFIRI/Avastin 03/03/2021 Cycle 21 FOLFIRI/Avastin 03/24/2021 Cycle 22 FOLFIRI/Avastin 04/14/2021 CTs 05/04/2021-slight improvement in hepatic metastatic disease. Cycle 23 FOLFIRI/Avastin 05/05/2021 Cycle 24 FOLFIRI/Avastin 05/25/2021 Cycle 25 FOLFIRI/Avastin 06/15/2021 Cycle 26 FOLFIRI/Avastin 07/13/2021 Cycle 27 FOLFIRI/Avastin 08/02/2021 CTs 08/30/2021-majority of liver lesions are unchanged in size, 1 lesion adjacent to the gallbladder has increased, no adenopathy, persistent pericolonic  stranding and peritoneal thickening at the cecum Progressive rise in CEA 08/31/2021-treatment changed to Xeloda/bevacizumab, began [place] 09/22/2021 CEA stable 11/03/2021 Xeloda/bevacizumab continued CTs 01/10/2022-increase in size of some of the liver lesions, others are stable, new tiny right lower lobe nodule, increased dilation of distal small bowel with increased wall  thickening at the terminal ileum and ileocecal valve with 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, stable bilateral paracolic gutter peritoneal thickening Panitumumab/ Encorafenib 01/12/2022 (encorafenib 01/19/2022) Panitumumab 02/02/2022 Panitumumab 02/20/2022 Panitumumab 03/06/2022 Panitumumab 03/20/2022 Panitumumab 04/03/2022 CTs 04/14/2022-decrease size of liver metastases, new borderline subcarinal adenopathy, improved peritoneal thickening, cirrhosis or pseudocirrhosis, persistent dilated cecum Encorafenib and Panitumumab continued 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  She continues to tolerate encorafenib and Panitumumab well.  Plan to continue the same.  CEA is stable.  CBC and chemistry panel reviewed.  Labs adequate to proceed as above.  She will receive IV magnesium today.  She will return for follow-up in 2 weeks.  We are available to see her sooner if needed.    Ned Card ANP/GNP-BC   06/02/2022  10:12 AM

## 2022-06-03 ENCOUNTER — Other Ambulatory Visit: Payer: Self-pay

## 2022-06-11 ENCOUNTER — Other Ambulatory Visit: Payer: Self-pay | Admitting: Oncology

## 2022-06-12 ENCOUNTER — Other Ambulatory Visit: Payer: Self-pay | Admitting: Oncology

## 2022-06-12 DIAGNOSIS — C182 Malignant neoplasm of ascending colon: Secondary | ICD-10-CM

## 2022-06-14 NOTE — Progress Notes (Signed)
[facility]   [phone] [phone]) [phone]   Clinic Follow up Note   Patient Care Team: Ferd Hibbs, NP as PCP - General (Nurse Practitioner) Ladell Pier, MD as Consulting Physician (Oncology) Owens Shark, NP as Nurse Practitioner (Hematology and Oncology) 06/16/2022  CHIEF COMPLAINT: Follow up colon cancer   CURRENT THERAPY: Encourafenib and Panitumumab   INTERVAL HISTORY: Stacy Burns returns for follow up as scheduled, last seen by Ned Card, NP 06/02/22. She continues encorafenib and panitumumab.  She is doing well overall, eating and drinking well with adequate energy level.  She continues working.  She takes oxycodone as needed at night mainly for low back pain.  Denies abdominal or other new pain.  Denies rash, N/V/D, fever, chills, dyspnea, or any other new specific complaints.  All other systems were reviewed with the patient and are negative.  MEDICAL HISTORY:  Past Medical History:  Diagnosis Date   colon ca dx'd 12/2019   COVID-19 virus infection 08/2019   not hospitalized.     Severe anemia 12/17/2019    SURGICAL HISTORY: Past Surgical History:  Procedure Laterality Date   BIOPSY  12/18/2019   Procedure: BIOPSY;  Surgeon: Thornton Park, MD;  Location: [facility];  Service: Gastroenterology;;   COLONOSCOPY WITH PROPOFOL N/A 12/18/2019   Procedure: COLONOSCOPY WITH PROPOFOL;  Surgeon: Thornton Park, MD;  Location: [facility];  Service: Gastroenterology;  Laterality: N/A;   ESOPHAGOGASTRODUODENOSCOPY (EGD) WITH PROPOFOL N/A 12/18/2019   Procedure: ESOPHAGOGASTRODUODENOSCOPY (EGD) WITH PROPOFOL;  Surgeon: Thornton Park, MD;  Location: [facility];  Service: Gastroenterology;  Laterality: N/A;   PORTACATH PLACEMENT N/A 12/19/2019   Procedure: INSERTION PORT-A-CATH WITH ULTRASOUND;  Surgeon: Rolm Bookbinder, MD;  Location: [facility];  Service: General;  Laterality: N/A;   SUBMUCOSAL TATTOO INJECTION  12/18/2019   Procedure:  SUBMUCOSAL TATTOO INJECTION;  Surgeon: Thornton Park, MD;  Location: [facility];  Service: Gastroenterology;;   TUBAL LIGATION      I have reviewed the social history and family history with the patient and they are unchanged from previous note.  ALLERGIES:  is allergic to milk-related compounds, penicillins, pineapple, chocolate, hydrocodone, other, and sulfa antibiotics.  MEDICATIONS:  Current Outpatient Medications  Medication Sig Dispense Refill   albuterol (VENTOLIN HFA) 108 (90 Base) MCG/ACT inhaler INHALE 2 PUFFS INTO THE LUNGS FOUR TIMES DAILY AS NEEDED 6.7 g 2   BRAFTOVI 75 MG capsule TAKE 4 CAPSULES ONCE DAILY 120 capsule 0   Cholecalciferol (VITAMIN D3) 25 MCG (1000 UT) CAPS Take 1,000 Units by mouth daily.     diphenoxylate-atropine (LOMOTIL) 2.5-0.025 MG tablet Take 1-2 tablets by mouth 4 (four) times daily as needed for diarrhea or loose stools. 30 tablet 0   doxycycline (VIBRA-TABS) 100 MG tablet Take 1 tablet (100 mg total) by mouth 2 (two) times daily. Begin on day of 1st treatment 60 tablet 2   enoxaparin (LOVENOX) 40 MG/0.4ML injection INJECT 0.4MLS INTO THE SKIN DAILY 28 mL 2   FEROSUL 325 (65 Fe) MG tablet TAKE 1 TABLET(325 MG) BY MOUTH DAILY WITH BREAKFAST 90 tablet 1   lidocaine-prilocaine (EMLA) cream Apply 1 application topically as needed. 30 g 0   loperamide (IMODIUM) 2 MG capsule Take 2 mg by mouth as needed for diarrhea or loose stools.     magnesium oxide (MAG-OX) 400 (240 Mg) MG tablet Take 1 tablet (400 mg total) by mouth daily. 30 tablet 0   potassium chloride (KLOR-CON) 10 MEQ tablet TAKE 1 TABLET(10 MEQ) BY MOUTH  TWICE DAILY 60 tablet 2   vitamin B-12 (CYANOCOBALAMIN) 1000 MCG tablet Take 5,000 mcg by mouth daily.     magic mouthwash SOLN Take 5-10 mLs by mouth 4 (four) times daily as needed for mouth pain. Swish and spit (Patient not taking: Reported on 11/03/2021) 240 mL 1   ondansetron (ZOFRAN) 8 MG tablet Take 1 tablet (8 mg total) by mouth every 8  (eight) hours as needed for nausea or vomiting. (Patient not taking: Reported on 10/13/2021) 30 tablet 0   oxyCODONE-acetaminophen (PERCOCET/ROXICET) 5-325 MG tablet Take 1 tablet by mouth every 8 (eight) hours as needed for severe pain. 60 tablet 0   prochlorperazine (COMPAZINE) 10 MG tablet TAKE 1 TABLET(10 MG) BY MOUTH EVERY 6 HOURS AS NEEDED FOR NAUSEA OR VOMITING (Patient not taking: Reported on 03/06/2022) 60 tablet 1   No current facility-administered medications for this visit.   Facility-Administered Medications Ordered in Other Visits  Medication Dose Route Frequency Provider Last Rate Last Admin   heparin lock flush 100 unit/mL  500 Units Intracatheter Once PRN Ladell Pier, MD       panitumumab (VECTIBIX) 400 mg in sodium chloride 0.9 % 100 mL chemo infusion  6 mg/kg (Treatment Plan Recorded) Intravenous Once Ladell Pier, MD       [id number] [facility]) injection 6 mg  6 mg Subcutaneous Once [name] B, MD       sodium chloride flush (NS) 0.9 % injection 10 mL  10 mL Intravenous PRN [name] B, MD   10 mL at 12/22/21 1100   sodium chloride flush (NS) 0.9 % injection 10 mL  10 mL Intracatheter PRN Ladell Pier, MD        PHYSICAL EXAMINATION:  Vitals:   06/16/22 0816  BP: (!) 149/52  Pulse: 72  Resp: 18  Temp: 98.2 F (36.8 C)  SpO2: 98%   Filed Weights   06/16/22 0816  Weight: 138 lb 9.6 oz (62.9 kg)    GENERAL:alert, no distress and comfortable SKIN: Ecchymoses over the forearms bilaterally.  No obvious rash to exposed skin, wearing mask  EYES: sclera clear NECK: Without mass LUNGS: clear with normal breathing effort HEART: regular rate & rhythm, no lower extremity edema ABDOMEN: abdomen soft, non-tender and normal bowel sounds NEURO: alert & oriented x 3 with fluent speech PAC without erythema  LABORATORY DATA:  I have reviewed the data as listed    Latest Ref Rng & Units 06/16/2022    8:09 AM 06/02/2022    9:31 AM 05/19/2022     8:16 AM  CBC  WBC 4.0 - 10.5 K/uL 5.7  5.2  4.2   Hemoglobin 12.0 - 15.0 g/dL 8.5  9.3  9.0   Hematocrit 36.0 - 46.0 % 27.0  29.1  28.3   Platelets 150 - 400 K/uL 140  145  121         Latest Ref Rng & Units 06/16/2022    8:09 AM 06/02/2022    9:31 AM 05/19/2022    8:16 AM  CMP  Glucose 70 - 99 mg/dL 78  75  72   BUN 8 - 23 mg/dL _0 Creatinine 0.44 - 1.00 mg/dL 0.74  0.75  0.71   Sodium 135 - 145 mmol/L 144  138  140   Potassium 3.5 - 5.1 mmol/L 4.5  4.6  4.3   Chloride 98 - 111 mmol/L 112  107  107   CO2 22 - 32  mmol/L _0 Calcium 8.9 - 10.3 mg/dL 8.9  9.0  8.8   Total Protein 6.5 - 8.1 g/dL 6.1  6.1  6.1   Total Bilirubin 0.3 - 1.2 mg/dL 0.3  0.3  0.3   Alkaline Phos 38 - 126 U/L 113  89  111   AST 15 - 41 U/L _1 ALT 0 - 44 U/L _2 RADIOGRAPHIC STUDIES: I have personally reviewed the radiological images as listed and agreed with the findings in the report. No results found.   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  Cirrhosis.  Innumerable liver lesions. Colonoscopy 12/18/2019-ulcerated partially obstructing large mass in the mid ascending colon.  Biopsy invasive adenocarcinoma; preserved expression major MMR proteins Upper endoscopy 12/18/2019-normal esophagus, stomach, examined duodenum CEA 12/19/2019-8,923 Biopsy liver lesion 12/23/2019-adenocarcinoma Foundation 1-K-ras wild-type; tumor mutation burden greater than or equal to 10; microsatellite stable; BRAF V600E Cycle 1 FOLFOX 01/01/2020 Cycle 2 FOLFOX 01/15/2020  Cycle 3 FOLFOX 01/29/2020, Udenyca added Cycle 4 FOLFOX 02/12/2020, Udenyca held Cycle 5 FOLFOX 02/26/2020, Udenyca Cycle 6 FOLFOX 03/11/2020, Udenyca held CT abdomen/pelvis 03/16/2020-stable diffuse liver lesions, stable strictured appearing ascending colon, possible pericolonic implant, vague left  lower lobe nodule Cycle 1 FOLFIRI/Avastin 04/06/2020 Cycle 2 FOLFIRI/Avastin 04/21/2020 Cycle 3 FOLFIRI/Avastin 05/06/2020 Cycle 4 FOLFIRI/Avastin 05/20/2020 Cycle 5 FOLFIRI/Avastin 06/03/2020 CTs 06/15/2020-mild to moderate improvement in hepatic metastasis.  No new or progressive disease.  Narrowing within the proximal ascending colon with upstream mild cecal and terminal ileum dilatation, similar. Cycle 6 FOLFIRI/Avastin 06/17/2020 Cycle 7 FOLFIRI/Avastin 07/01/2020 Cycle 8 FOLFIRI/Avastin 07/15/2020 Cycle 9 FOLFIRI/Avastin 07/29/2020-5-FU bolus eliminated, 5-FU infusion and irinotecan dose reduced Cycle 10 FOLFIRI/Avastin 08/12/2020 CTs 08/23/2020- mild residual wall thickening involving the cecum/ascending colon.  Numerous liver metastases improved. Cycle 11 FOLFIRI/Avastin 08/25/2020 Cycle 12 FOLFIRI/Avastin 09/16/2020 Cycle 13 FOLFIRI/Avastin 10/07/2020 Cycle 14 FOLFIRI/Avastin 10/28/2020 Cycle 15 FOLFIRI/Avastin 11/18/2020 CT abdomen/pelvis 12/06/2020-mild improvement in multifocal hepatic metastases, wall thickening at the ascending colon with pericolonic inflammatory changes Cycle 16 FOLFIRI/Avastin 12/09/2020 Cycle 17 FOLFIRI/Avastin 12/30/2020 Cycle 18 FOLFIRI/Avastin 01/20/2021 Cycle 19 FOLFIRI/Avastin 02/10/2021 Cycle 20 FOLFIRI/Avastin 03/03/2021 Cycle 21 FOLFIRI/Avastin 03/24/2021 Cycle 22 FOLFIRI/Avastin 04/14/2021 CTs 05/04/2021-slight improvement in hepatic metastatic disease. Cycle 23 FOLFIRI/Avastin 05/05/2021 Cycle 24 FOLFIRI/Avastin 05/25/2021 Cycle 25 FOLFIRI/Avastin 06/15/2021 Cycle 26 FOLFIRI/Avastin 07/13/2021 Cycle 27 FOLFIRI/Avastin 08/02/2021 CTs 08/30/2021-majority of liver lesions are unchanged in size, 1 lesion adjacent to the gallbladder has increased, no adenopathy, persistent pericolonic stranding and peritoneal thickening at the cecum Progressive rise in CEA 08/31/2021-treatment changed to Xeloda/bevacizumab, began [place] 09/22/2021 CEA stable 11/03/2021 Xeloda/bevacizumab  continued CTs 01/10/2022-increase in size of some of the liver lesions, others are stable, new tiny right lower lobe nodule, increased dilation of distal small bowel with increased wall thickening at the terminal ileum and ileocecal valve with 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, stable bilateral paracolic gutter peritoneal thickening Panitumumab/ Encorafenib 01/12/2022 (encorafenib 01/19/2022) Panitumumab 02/02/2022 Panitumumab 02/20/2022 Panitumumab 03/06/2022 Panitumumab 03/20/2022 Panitumumab 04/03/2022 CTs 04/14/2022-decrease size of liver metastases, new borderline subcarinal adenopathy, improved peritoneal thickening, cirrhosis or pseudocirrhosis, persistent dilated cecum Encorafenib and Panitumumab continued Panitumumab 04/18/2022 Panitumumab 05/05/2022 Panitumumab 05/19/2022 Panitumumab 06/02/2022 Panitumumab 06/16/2022, 4g IV mag and begin oral mag po once daily  Anemia secondary to GI blood loss, on oral iron BID  Cirrhosis 12/26/2019-hospital admission for right lower  extremity DVT and GI bleed, treated with heparin anticoagulation beginning 12/26/2019, converted to Lovenox at discharge 12/29/2019 Lovenox reduced to 40 mg daily 02/10/2021 secondary to bruising and nosebleeding History of low-grade fever-likely "tumor" fever COVID-19 infection January 2021   Disposition: Stacy Burns appears stable.  She continues Encorafenib and q2 week panitumumab, tolerating well without significant side effects.  She is able to function well at home and work.  There is no clinical evidence of disease progression.  Labs reviewed, adequate for treatment.  The CEA remains stable.  She will receive 4 g IV mag today and begin oral mag p.o. once daily at home. I will refill oxycodone which she uses PRN at night.   Continue the same plan. F/up and next cycle Panitumumab in 2 weeks.   All questions were answered. The patient knows to call the clinic with any problems, questions or concerns. No barriers to  learning were detected.      Alla Feeling, NP 06/16/22

## 2022-06-16 ENCOUNTER — Inpatient Hospital Stay: Payer: BC Managed Care – PPO

## 2022-06-16 ENCOUNTER — Inpatient Hospital Stay: Payer: BC Managed Care – PPO | Attending: Oncology

## 2022-06-16 ENCOUNTER — Other Ambulatory Visit: Payer: Self-pay

## 2022-06-16 ENCOUNTER — Inpatient Hospital Stay (HOSPITAL_BASED_OUTPATIENT_CLINIC_OR_DEPARTMENT_OTHER): Payer: BC Managed Care – PPO | Admitting: Nurse Practitioner

## 2022-06-16 ENCOUNTER — Encounter: Payer: Self-pay | Admitting: Nurse Practitioner

## 2022-06-16 VITALS — BP 151/60 | HR 70 | Resp 18

## 2022-06-16 DIAGNOSIS — C182 Malignant neoplasm of ascending colon: Secondary | ICD-10-CM

## 2022-06-16 DIAGNOSIS — K7460 Unspecified cirrhosis of liver: Secondary | ICD-10-CM | POA: Insufficient documentation

## 2022-06-16 DIAGNOSIS — Z79899 Other long term (current) drug therapy: Secondary | ICD-10-CM | POA: Diagnosis not present

## 2022-06-16 DIAGNOSIS — D649 Anemia, unspecified: Secondary | ICD-10-CM

## 2022-06-16 DIAGNOSIS — D500 Iron deficiency anemia secondary to blood loss (chronic): Secondary | ICD-10-CM | POA: Diagnosis not present

## 2022-06-16 DIAGNOSIS — Z5112 Encounter for antineoplastic immunotherapy: Secondary | ICD-10-CM | POA: Diagnosis not present

## 2022-06-16 DIAGNOSIS — C787 Secondary malignant neoplasm of liver and intrahepatic bile duct: Secondary | ICD-10-CM | POA: Diagnosis not present

## 2022-06-16 DIAGNOSIS — Z23 Encounter for immunization: Secondary | ICD-10-CM | POA: Insufficient documentation

## 2022-06-16 LAB — CBC WITH DIFFERENTIAL (CANCER CENTER ONLY)
Abs Immature Granulocytes: 0.02 10*3/uL (ref 0.00–0.07)
Basophils Absolute: 0 10*3/uL (ref 0.0–0.1)
Basophils Relative: 1 %
Eosinophils Absolute: 0.1 10*3/uL (ref 0.0–0.5)
Eosinophils Relative: 3 %
HCT: 27 % — ABNORMAL LOW (ref 36.0–46.0)
Hemoglobin: 8.5 g/dL — ABNORMAL LOW (ref 12.0–15.0)
Immature Granulocytes: 0 %
Lymphocytes Relative: 14 %
Lymphs Abs: 0.8 10*3/uL (ref 0.7–4.0)
MCH: 29.1 pg (ref 26.0–34.0)
MCHC: 31.5 g/dL (ref 30.0–36.0)
MCV: 92.5 fL (ref 80.0–100.0)
Monocytes Absolute: 0.7 10*3/uL (ref 0.1–1.0)
Monocytes Relative: 13 %
Neutro Abs: 4 10*3/uL (ref 1.7–7.7)
Neutrophils Relative %: 69 %
Platelet Count: 140 10*3/uL — ABNORMAL LOW (ref 150–400)
RBC: 2.92 MIL/uL — ABNORMAL LOW (ref 3.87–5.11)
RDW: 15.7 % — ABNORMAL HIGH (ref 11.5–15.5)
WBC Count: 5.7 10*3/uL (ref 4.0–10.5)
nRBC: 0 % (ref 0.0–0.2)

## 2022-06-16 LAB — CMP (CANCER CENTER ONLY)
ALT: 13 U/L (ref 0–44)
AST: 14 U/L — ABNORMAL LOW (ref 15–41)
Albumin: 3.5 g/dL (ref 3.5–5.0)
Alkaline Phosphatase: 113 U/L (ref 38–126)
Anion gap: 9 (ref 5–15)
BUN: 28 mg/dL — ABNORMAL HIGH (ref 8–23)
CO2: 23 mmol/L (ref 22–32)
Calcium: 8.9 mg/dL (ref 8.9–10.3)
Chloride: 112 mmol/L — ABNORMAL HIGH (ref 98–111)
Creatinine: 0.74 mg/dL (ref 0.44–1.00)
GFR, Estimated: >60 mL/min (ref >60)
Glucose, Bld: 78 mg/dL (ref 70–99)
Potassium: 4.5 mmol/L (ref 3.5–5.1)
Sodium: 144 mmol/L (ref 135–145)
Total Bilirubin: 0.3 mg/dL (ref 0.3–1.2)
Total Protein: 6.1 g/dL — ABNORMAL LOW (ref 6.5–8.1)

## 2022-06-16 LAB — CEA (ACCESS): CEA (CHCC): 9.78 ng/mL — ABNORMAL HIGH (ref 0.00–5.00)

## 2022-06-16 LAB — MAGNESIUM: Magnesium: 1.4 mg/dL — ABNORMAL LOW (ref 1.7–2.4)

## 2022-06-16 MED ORDER — MAGNESIUM OXIDE -MG SUPPLEMENT 400 (240 MG) MG PO TABS: 400.0000 mg | ORAL_TABLET | Freq: Every day | ORAL | 0 refills | Status: DC

## 2022-06-16 MED ORDER — OXYCODONE-ACETAMINOPHEN 5-325 MG PO TABS: 1.0000 | ORAL_TABLET | Freq: Three times a day (TID) | ORAL | 0 refills | Status: DC | PRN

## 2022-06-16 MED ORDER — SODIUM CHLORIDE 0.9 % IV SOLN
6.0000 mg/kg | Freq: Once | INTRAVENOUS | Status: AC
  Administered 2022-06-16: 400 mg via INTRAVENOUS
  Filled 2022-06-16: qty 20

## 2022-06-16 MED ORDER — SODIUM CHLORIDE 0.9 % IV SOLN: Freq: Once | INTRAVENOUS | Status: AC

## 2022-06-16 MED ORDER — SODIUM CHLORIDE 0.9% FLUSH
10.0000 mL | INTRAVENOUS | Status: DC | PRN
  Administered 2022-06-16: 10 mL

## 2022-06-16 MED ORDER — HEPARIN SOD (PORK) LOCK FLUSH 100 UNIT/ML IV SOLN
500.0000 [IU] | Freq: Once | INTRAVENOUS | Status: AC | PRN
  Administered 2022-06-16: 500 [IU]

## 2022-06-16 MED ORDER — MAGNESIUM SULFATE 4 GM/100ML IV SOLN
4.0000 g | Freq: Once | INTRAVENOUS | Status: AC
  Administered 2022-06-16: 4 g via INTRAVENOUS
  Filled 2022-06-16: qty 100

## 2022-06-16 MED ORDER — MAGNESIUM SULFATE 4 GM/100ML IV SOLN: 4.0000 g | Freq: Once | INTRAVENOUS | Status: DC

## 2022-06-16 NOTE — Progress Notes (Signed)
Patient's Magnesium level today is 1.4. Per Cira Rue, NP: Give patient 4g of Mag+ IVPB.

## 2022-06-16 NOTE — Patient Instructions (Signed)
[facility]   Discharge Instructions: Thank you for choosing [facility] to provide your oncology and hematology care.   If you have a lab appointment with the [facility], please go directly to the [facility] and check in at the registration area.   Wear comfortable clothing and clothing appropriate for easy access to any Portacath or PICC line.   We strive to give you quality time with your provider. You may need to reschedule your appointment if you arrive late (15 or more minutes).  Arriving late affects you and other patients whose appointments are after yours.  Also, if you miss three or more appointments without notifying the office, you may be dismissed from the clinic at the provider's discretion.      For prescription refill requests, have your pharmacy contact our office and allow 72 hours for refills to be completed.    Today you received the following chemotherapy and/or immunotherapy agents Panitumumab (VECTIBIX).      To help prevent nausea and vomiting after your treatment, we encourage you to take your nausea medication as directed.  BELOW ARE SYMPTOMS THAT SHOULD BE REPORTED IMMEDIATELY: *FEVER GREATER THAN 100.4 F (38 C) OR HIGHER *CHILLS OR SWEATING *NAUSEA AND VOMITING THAT IS NOT CONTROLLED WITH YOUR NAUSEA MEDICATION *UNUSUAL SHORTNESS OF BREATH *UNUSUAL BRUISING OR BLEEDING *URINARY PROBLEMS (pain or burning when urinating, or frequent urination) *BOWEL PROBLEMS (unusual diarrhea, constipation, pain near the anus) TENDERNESS IN MOUTH AND THROAT WITH OR WITHOUT PRESENCE OF ULCERS (sore throat, sores in mouth, or a toothache) UNUSUAL RASH, SWELLING OR PAIN  UNUSUAL VAGINAL DISCHARGE OR ITCHING   Items with * indicate a potential emergency and should be followed up as soon as possible or go to the Emergency Department if any problems should occur.  Please show the CHEMOTHERAPY ALERT CARD or IMMUNOTHERAPY ALERT CARD at  check-in to the Emergency Department and triage nurse.  Should you have questions after your visit or need to cancel or reschedule your appointment, please contact [facility]  Dept: [phone]  and follow the prompts.  Office hours are 8:00 a.m. to 4:30 p.m. Monday - Friday. Please note that voicemails left after 4:00 p.m. may not be returned until the following business day.  We are closed weekends and major holidays. You have access to a nurse at all times for urgent questions. Please call the main number to the clinic Dept: [phone] and follow the prompts.   For any non-urgent questions, you may also contact your provider using MyChart. We now offer e-Visits for anyone 71 and older to request care online for non-urgent symptoms. For details visit [email or web address].   Also download the MyChart app! Go to the app store, search "MyChart", open the app, select [facility], and log in with your MyChart username and password.  Masks are optional in the cancer centers. If you would like for your care team to wear a mask while they are taking care of you, please let them know. You may have one support person who is at least 76 years old accompany you for your appointments.  Panitumumab Injection What is this medication? PANITUMUMAB (pan i TOOM ue mab) treats colorectal cancer. It works by blocking a protein that causes cancer cells to grow and multiply. This helps to slow or stop the spread of cancer cells. It is a monoclonal antibody. This medicine may be used for other purposes; ask your health care provider or  pharmacist if you have questions. COMMON BRAND NAME(S): Vectibix What should I tell my care team before I take this medication? They need to know if you have any of these conditions: Eye disease Low levels of magnesium in the blood Lung disease An unusual or allergic reaction to panitumumab, other medications, foods, dyes, or preservatives Pregnant or  trying to get pregnant Breast-feeding How should I use this medication? This medication is injected into a vein. It is given by your care team in a hospital or clinic setting. Talk to your care team about the use of this medication in children. Special care may be needed. Overdosage: If you think you have taken too much of this medicine contact a poison control center or emergency room at once. NOTE: This medicine is only for you. Do not share this medicine with others. What if I miss a dose? Keep appointments for follow-up doses. It is important not to miss your dose. Call your care team if you are unable to keep an appointment. What may interact with this medication? Bevacizumab This list may not describe all possible interactions. Give your health care provider a list of all the medicines, herbs, non-prescription drugs, or dietary supplements you use. Also tell them if you smoke, drink alcohol, or use illegal drugs. Some items may interact with your medicine. What should I watch for while using this medication? Your condition will be monitored carefully while you are receiving this medication. This medication may make you feel generally unwell. This is not uncommon as chemotherapy can affect healthy cells as well as cancer cells. Report any side effects. Continue your course of treatment even though you feel ill unless your care team tells you to stop. This medication can make you more sensitive to the sun. Keep out of the sun while receiving this medication and for 2 months after stopping therapy. If you cannot avoid being in the sun, wear protective clothing and sunscreen. Do not use sun lamps, tanning beds, or tanning booths. Check with your care team if you have severe diarrhea, nausea, and vomiting or if you sweat a lot. The loss of too much body fluid may make it dangerous for you to take this medication. This medication may cause serious skin reactions. They can happen weeks to months  after starting the medication. Contact your care team right away if you notice fevers or flu-like symptoms with a rash. The rash may be red or purple and then turn into blisters or peeling of the skin. You may also notice a red rash with swelling of the face, lips, or lymph nodes in your neck or under your arms. Talk to your care team if you may be pregnant. Serious birth defects can occur if you take this medication during pregnancy and for 2 months after the last dose. Contraception is recommended while taking this medication and for 2 months after the last dose. Your care team can help you find the option that works for you. Do not breastfeed while taking this medication and for 2 months after the last dose. This medication may cause infertility. Talk to your care team if you are concerned about your fertility. What side effects may I notice from receiving this medication? Side effects that you should report to your care team as soon as possible: Allergic reactions--skin rash, itching, hives, swelling of the face, lips, tongue, or throat Dry cough, shortness of breath or trouble breathing Eye pain, redness, irritation, or discharge with blurry or decreased vision Infusion  reactions--chest pain, shortness of breath or trouble breathing, feeling faint or lightheaded Low magnesium level--muscle pain or cramps, unusual weakness or fatigue, fast or irregular heartbeat, tremors Low potassium level--muscle pain or cramps, unusual weakness or fatigue, fast or irregular heartbeat, constipation Redness, blistering, peeling, or loosening of the skin, including inside the mouth Skin reactions on sun-exposed areas Side effects that usually do not require medical attention (report to your care team if they continue or are bothersome): Change in nail shape, thickness, or color Diarrhea Dry skin Fatigue Nausea Vomiting This list may not describe all possible side effects. Call your doctor for medical advice  about side effects. You may report side effects to FDA at 1-800-FDA-1088. Where should I keep my medication? This medication is given in a hospital or clinic. It will not be stored at home. NOTE: This sheet is a summary. It may not cover all possible information. If you have questions about this medicine, talk to your doctor, pharmacist, or health care provider.  2023 Elsevier/Gold Standard (2021-12-14 00:00:00)  Magnesium Sulfate Injection What is this medication? MAGNESIUM SULFATE (mag NEE zee um SUL fate) prevents and treats low levels of magnesium in your body. It may also be used to prevent and treat seizures during pregnancy in people with high blood pressure disorders, such as preeclampsia or eclampsia. Magnesium plays an important role in maintaining the health of your muscles and nervous system. This medicine may be used for other purposes; ask your health care provider or pharmacist if you have questions. What should I tell my care team before I take this medication? They need to know if you have any of these conditions: Heart disease History of irregular heart beat Kidney disease An unusual or allergic reaction to magnesium sulfate, medications, foods, dyes, or preservatives Pregnant or trying to get pregnant Breast-feeding How should I use this medication? This medication is for infusion into a vein. It is given in a hospital or clinic setting. Talk to your care team about the use of this medication in children. While this medication may be prescribed for selected conditions, precautions do apply. Overdosage: If you think you have taken too much of this medicine contact a poison control center or emergency room at once. NOTE: This medicine is only for you. Do not share this medicine with others. What if I miss a dose? This does not apply. What may interact with this medication? Certain medications for anxiety or sleep Certain medications for seizures, such  phenobarbital Digoxin Medications that relax muscles for surgery Narcotic medications for pain This list may not describe all possible interactions. Give your health care provider a list of all the medicines, herbs, non-prescription drugs, or dietary supplements you use. Also tell them if you smoke, drink alcohol, or use illegal drugs. Some items may interact with your medicine. What should I watch for while using this medication? Your condition will be monitored carefully while you are receiving this medication. You may need blood work done while you are receiving this medication. What side effects may I notice from receiving this medication? Side effects that you should report to your care team as soon as possible: Allergic reactions--skin rash, itching, hives, swelling of the face, lips, tongue, or throat High magnesium level--confusion, drowsiness, facial flushing, redness, sweating, muscle weakness, fast or irregular heartbeat, trouble breathing Low blood pressure--dizziness, feeling faint or lightheaded, blurry vision Side effects that usually do not require medical attention (report to your care team if they continue or are bothersome): Headache Nausea  This list may not describe all possible side effects. Call your doctor for medical advice about side effects. You may report side effects to FDA at 1-800-FDA-1088. Where should I keep my medication? This medication is given in a hospital or clinic and will not be stored at home. NOTE: This sheet is a summary. It may not cover all possible information. If you have questions about this medicine, talk to your doctor, pharmacist, or health care provider.  2023 Elsevier/Gold Standard (2012-12-06 00:00:00)

## 2022-06-16 NOTE — Progress Notes (Signed)
Patient seen by  Cira Rue  Vitals are within treatment parameters.  Labs reviewed by  Cira Rue  and are not all within treatment parameters. Magnesium is 1.4.  Per physician team, patient is ready for treatment. Please note that modifications are being made to the treatment plan including 4g of magnesium.

## 2022-06-20 ENCOUNTER — Other Ambulatory Visit: Payer: Self-pay

## 2022-06-25 ENCOUNTER — Other Ambulatory Visit: Payer: Self-pay | Admitting: Oncology

## 2022-06-30 ENCOUNTER — Inpatient Hospital Stay: Payer: BC Managed Care – PPO

## 2022-06-30 ENCOUNTER — Encounter: Payer: Self-pay | Admitting: *Deleted

## 2022-06-30 ENCOUNTER — Inpatient Hospital Stay (HOSPITAL_BASED_OUTPATIENT_CLINIC_OR_DEPARTMENT_OTHER): Payer: BC Managed Care – PPO | Admitting: Oncology

## 2022-06-30 VITALS — BP 152/67 | HR 73 | Resp 20

## 2022-06-30 DIAGNOSIS — C182 Malignant neoplasm of ascending colon: Secondary | ICD-10-CM

## 2022-06-30 DIAGNOSIS — Z79899 Other long term (current) drug therapy: Secondary | ICD-10-CM | POA: Diagnosis not present

## 2022-06-30 DIAGNOSIS — D500 Iron deficiency anemia secondary to blood loss (chronic): Secondary | ICD-10-CM | POA: Diagnosis not present

## 2022-06-30 DIAGNOSIS — Z5112 Encounter for antineoplastic immunotherapy: Secondary | ICD-10-CM | POA: Diagnosis not present

## 2022-06-30 DIAGNOSIS — C787 Secondary malignant neoplasm of liver and intrahepatic bile duct: Secondary | ICD-10-CM | POA: Diagnosis not present

## 2022-06-30 DIAGNOSIS — K7460 Unspecified cirrhosis of liver: Secondary | ICD-10-CM | POA: Diagnosis not present

## 2022-06-30 DIAGNOSIS — Z23 Encounter for immunization: Secondary | ICD-10-CM | POA: Diagnosis not present

## 2022-06-30 LAB — CBC WITH DIFFERENTIAL (CANCER CENTER ONLY)
Abs Immature Granulocytes: 0.02 10*3/uL (ref 0.00–0.07)
Basophils Absolute: 0 10*3/uL (ref 0.0–0.1)
Basophils Relative: 1 %
Eosinophils Absolute: 0.2 10*3/uL (ref 0.0–0.5)
Eosinophils Relative: 3 %
HCT: 26.3 % — ABNORMAL LOW (ref 36.0–46.0)
Hemoglobin: 8.3 g/dL — ABNORMAL LOW (ref 12.0–15.0)
Immature Granulocytes: 0 %
Lymphocytes Relative: 18 %
Lymphs Abs: 0.9 10*3/uL (ref 0.7–4.0)
MCH: 28.5 pg (ref 26.0–34.0)
MCHC: 31.6 g/dL (ref 30.0–36.0)
MCV: 90.4 fL (ref 80.0–100.0)
Monocytes Absolute: 0.7 10*3/uL (ref 0.1–1.0)
Monocytes Relative: 13 %
Neutro Abs: 3.4 10*3/uL (ref 1.7–7.7)
Neutrophils Relative %: 65 %
Platelet Count: 155 10*3/uL (ref 150–400)
RBC: 2.91 MIL/uL — ABNORMAL LOW (ref 3.87–5.11)
RDW: 14.9 % (ref 11.5–15.5)
WBC Count: 5.1 10*3/uL (ref 4.0–10.5)
nRBC: 0 % (ref 0.0–0.2)

## 2022-06-30 LAB — CMP (CANCER CENTER ONLY)
ALT: 8 U/L (ref 0–44)
AST: 12 U/L — ABNORMAL LOW (ref 15–41)
Albumin: 3.5 g/dL (ref 3.5–5.0)
Alkaline Phosphatase: 105 U/L (ref 38–126)
Anion gap: 8 (ref 5–15)
BUN: 19 mg/dL (ref 8–23)
CO2: 23 mmol/L (ref 22–32)
Calcium: 8.8 mg/dL — ABNORMAL LOW (ref 8.9–10.3)
Chloride: 110 mmol/L (ref 98–111)
Creatinine: 0.63 mg/dL (ref 0.44–1.00)
GFR, Estimated: >60 mL/min (ref >60)
Glucose, Bld: 89 mg/dL (ref 70–99)
Potassium: 4 mmol/L (ref 3.5–5.1)
Sodium: 141 mmol/L (ref 135–145)
Total Bilirubin: 0.3 mg/dL (ref 0.3–1.2)
Total Protein: 6.2 g/dL — ABNORMAL LOW (ref 6.5–8.1)

## 2022-06-30 LAB — CEA (ACCESS): CEA (CHCC): 9.88 ng/mL — ABNORMAL HIGH (ref 0.00–5.00)

## 2022-06-30 LAB — MAGNESIUM: Magnesium: 1.4 mg/dL — ABNORMAL LOW (ref 1.7–2.4)

## 2022-06-30 MED ORDER — INFLUENZA VAC A&B SA ADJ QUAD 0.5 ML IM PRSY
0.5000 mL | PREFILLED_SYRINGE | Freq: Once | INTRAMUSCULAR | Status: AC
  Administered 2022-06-30: 0.5 mL via INTRAMUSCULAR
  Filled 2022-06-30: qty 0.5

## 2022-06-30 MED ORDER — SODIUM CHLORIDE 0.9 % IV SOLN: Freq: Once | INTRAVENOUS | Status: AC

## 2022-06-30 MED ORDER — SODIUM CHLORIDE 0.9 % IV SOLN
6.0000 mg/kg | Freq: Once | INTRAVENOUS | Status: AC
  Administered 2022-06-30: 400 mg via INTRAVENOUS
  Filled 2022-06-30: qty 20

## 2022-06-30 MED ORDER — MAGNESIUM SULFATE 2 GM/50ML IV SOLN
2.0000 g | Freq: Once | INTRAVENOUS | Status: AC
  Administered 2022-06-30: 2 g via INTRAVENOUS
  Filled 2022-06-30: qty 50

## 2022-06-30 MED ORDER — SODIUM CHLORIDE 0.9% FLUSH
10.0000 mL | INTRAVENOUS | Status: DC | PRN
  Administered 2022-06-30: 10 mL

## 2022-06-30 MED ORDER — HEPARIN SOD (PORK) LOCK FLUSH 100 UNIT/ML IV SOLN
500.0000 [IU] | Freq: Once | INTRAVENOUS | Status: AC | PRN
  Administered 2022-06-30: 500 [IU]

## 2022-06-30 NOTE — Progress Notes (Signed)
Patient seen by Dr. Benay Spice today  Vitals are within treatment parameters.  Labs reviewed by Dr. Benay Spice and are within treatment parameters.Mg+ 1.4 today  Per physician team, patient is ready for treatment. Please note that modifications are being made to the treatment plan including Will receive 2 grams IV Mg+ today and patient requesting Flu vaccine

## 2022-06-30 NOTE — Progress Notes (Signed)
[facility] OFFICE PROGRESS NOTE   Diagnosis: Colon cancer  INTERVAL HISTORY:   Stacy Burns returns as scheduled.  She continues encorafenib and panitumumab.  Her skin is dry.  No diarrhea.  No pain.  She has developed a "knot "at the medial right lower leg for the past week.  She continues Lovenox anticoagulation.  Objective:  Vital signs in last 24 hours:  Blood pressure 137/63, pulse 73, temperature 98.2 F (36.8 C), temperature source Oral, resp. rate 18, height _0  (1.702 m), weight 140 lb (63.5 kg), SpO2 100 %.    HEENT: No thrush or ulcers Resp: Lungs clear bilaterally Cardio: Regular rate and rhythm GI: No hepatosplenomegaly, firm fullness in the right lower abdomen, nontender Vascular: No leg edema, 1-2 cm nodular area at the medial right lower leg.  No discrete palpable cord.  No erythema.  Skin: Mild acne type rash over the face  Portacath/PICC-without erythema  Lab Results:  Lab Results  Component Value Date   WBC 5.1 06/30/2022   HGB 8.3 (L) 06/30/2022   HCT 26.3 (L) 06/30/2022   MCV 90.4 06/30/2022   PLT 155 06/30/2022   NEUTROABS 3.4 06/30/2022    CMP  Lab Results  Component Value Date   NA 144 06/16/2022   K 4.5 06/16/2022   CL 112 (H) 06/16/2022   CO2 23 06/16/2022   GLUCOSE 78 06/16/2022   BUN 28 (H) 06/16/2022   CREATININE 0.74 06/16/2022   CALCIUM 8.9 06/16/2022   PROT 6.1 (L) 06/16/2022   ALBUMIN 3.5 06/16/2022   AST 14 (L) 06/16/2022   ALT 13 06/16/2022   ALKPHOS 113 06/16/2022   BILITOT 0.3 06/16/2022   GFRNONAA >60 06/16/2022   GFRAA >60 05/06/2020    Lab Results  Component Value Date   CEA1 54.57 (H) 12/30/2020   CEA 9.78 (H) 06/16/2022     Medications: I have reviewed the patient's current medications.   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  Cirrhosis.  Innumerable liver  lesions. Colonoscopy 12/18/2019-ulcerated partially obstructing large mass in the mid ascending colon.  Biopsy invasive adenocarcinoma; preserved expression major MMR proteins Upper endoscopy 12/18/2019-normal esophagus, stomach, examined duodenum CEA 12/19/2019-8,923 Biopsy liver lesion 12/23/2019-adenocarcinoma Foundation 1-K-ras wild-type; tumor mutation burden greater than or equal to 10; microsatellite stable; BRAF V600E Cycle 1 FOLFOX 01/01/2020 Cycle 2 FOLFOX 01/15/2020  Cycle 3 FOLFOX 01/29/2020, Udenyca added Cycle 4 FOLFOX 02/12/2020, Udenyca held Cycle 5 FOLFOX 02/26/2020, Udenyca Cycle 6 FOLFOX 03/11/2020, Udenyca held CT abdomen/pelvis 03/16/2020-stable diffuse liver lesions, stable strictured appearing ascending colon, possible pericolonic implant, vague left lower lobe nodule Cycle 1 FOLFIRI/Avastin 04/06/2020 Cycle 2 FOLFIRI/Avastin 04/21/2020 Cycle 3 FOLFIRI/Avastin 05/06/2020 Cycle 4 FOLFIRI/Avastin 05/20/2020 Cycle 5 FOLFIRI/Avastin 06/03/2020 CTs 06/15/2020-mild to moderate improvement in hepatic metastasis.  No new or progressive disease.  Narrowing within the proximal ascending colon with upstream mild cecal and terminal ileum dilatation, similar. Cycle 6 FOLFIRI/Avastin 06/17/2020 Cycle 7 FOLFIRI/Avastin 07/01/2020 Cycle 8 FOLFIRI/Avastin 07/15/2020 Cycle 9 FOLFIRI/Avastin 07/29/2020-5-FU bolus eliminated, 5-FU infusion and irinotecan dose reduced Cycle 10 FOLFIRI/Avastin 08/12/2020 CTs 08/23/2020- mild residual wall thickening involving the cecum/ascending colon.  Numerous liver metastases improved. Cycle 11 FOLFIRI/Avastin 08/25/2020 Cycle 12 FOLFIRI/Avastin 09/16/2020 Cycle 13 FOLFIRI/Avastin 10/07/2020 Cycle 14 FOLFIRI/Avastin 10/28/2020 Cycle 15 FOLFIRI/Avastin 11/18/2020 CT abdomen/pelvis 12/06/2020-mild improvement in multifocal hepatic metastases, wall thickening at the ascending colon with pericolonic inflammatory changes Cycle 16 FOLFIRI/Avastin 12/09/2020 Cycle 17 FOLFIRI/Avastin  12/30/2020 Cycle 18 FOLFIRI/Avastin 01/20/2021 Cycle 19 FOLFIRI/Avastin 02/10/2021 Cycle 20 FOLFIRI/Avastin 03/03/2021 Cycle 21 FOLFIRI/Avastin 03/24/2021 Cycle 22 FOLFIRI/Avastin 04/14/2021 CTs 05/04/2021-slight improvement in hepatic metastatic disease. Cycle 23 FOLFIRI/Avastin 05/05/2021 Cycle 24 FOLFIRI/Avastin 05/25/2021 Cycle 25 FOLFIRI/Avastin 06/15/2021 Cycle 26 FOLFIRI/Avastin 07/13/2021 Cycle 27 FOLFIRI/Avastin 08/02/2021 CTs 08/30/2021-majority of liver lesions are unchanged in size, 1 lesion adjacent to the gallbladder has increased, no adenopathy, persistent pericolonic stranding and peritoneal thickening at the cecum Progressive rise in CEA 08/31/2021-treatment changed to Xeloda/bevacizumab, began [place] 09/22/2021 CEA stable 11/03/2021 Xeloda/bevacizumab continued CTs 01/10/2022-increase in size of some of the liver lesions, others are stable, new tiny right lower lobe nodule, increased dilation of distal small bowel with increased wall thickening at the terminal ileum and ileocecal valve with 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, stable bilateral paracolic gutter peritoneal thickening Panitumumab/ Encorafenib 01/12/2022 (encorafenib 01/19/2022) Panitumumab 02/02/2022 Panitumumab 02/20/2022 Panitumumab 03/06/2022 Panitumumab 03/20/2022 Panitumumab 04/03/2022 CTs 04/14/2022-decrease size of liver metastases, new borderline subcarinal adenopathy, improved peritoneal thickening, cirrhosis or pseudocirrhosis, persistent dilated cecum Encorafenib and Panitumumab continued Panitumumab 04/18/2022 Panitumumab 05/05/2022 Panitumumab 05/19/2022 Panitumumab 06/02/2022 Panitumumab 06/16/2022, 4g IV mag and begin oral mag po once daily  Anemia secondary to GI blood loss, on oral iron BID  Cirrhosis 12/26/2019-hospital admission for right lower extremity DVT and GI bleed, treated with heparin anticoagulation beginning 12/26/2019, converted to Lovenox at discharge 12/29/2019 Lovenox reduced to 40 mg daily  02/10/2021 secondary to bruising and nosebleeding History of low-grade fever-likely "tumor" fever COVID-19 infection January 2021     Disposition: Stacy Burns appears stable.  She continues panitumumab and encorafenib.  She is tolerating treatment well.  She will call for an increase in the nodular area of the right lower leg.  This could be an area of superficial phlebitis.  Stacy Burns will complete another treatment panitumumab today.  She will return for an office visit in 2 weeks.  We will follow-up on the CEA from today.  She will be scheduled for staging CTs on 07/28/2022.  [name], MD  06/30/2022  8:42 AM

## 2022-06-30 NOTE — Patient Instructions (Signed)
[facility]   Discharge Instructions: Thank you for choosing [facility] to provide your oncology and hematology care.   If you have a lab appointment with the [facility], please go directly to the [facility] and check in at the registration area.   Wear comfortable clothing and clothing appropriate for easy access to any Portacath or PICC line.   We strive to give you quality time with your provider. You may need to reschedule your appointment if you arrive late (15 or more minutes).  Arriving late affects you and other patients whose appointments are after yours.  Also, if you miss three or more appointments without notifying the office, you may be dismissed from the clinic at the provider's discretion.      For prescription refill requests, have your pharmacy contact our office and allow 72 hours for refills to be completed.    Today you received the following chemotherapy and/or immunotherapy agents Panitumumab (VECTIBIX).      To help prevent nausea and vomiting after your treatment, we encourage you to take your nausea medication as directed.  BELOW ARE SYMPTOMS THAT SHOULD BE REPORTED IMMEDIATELY: *FEVER GREATER THAN 100.4 F (38 C) OR HIGHER *CHILLS OR SWEATING *NAUSEA AND VOMITING THAT IS NOT CONTROLLED WITH YOUR NAUSEA MEDICATION *UNUSUAL SHORTNESS OF BREATH *UNUSUAL BRUISING OR BLEEDING *URINARY PROBLEMS (pain or burning when urinating, or frequent urination) *BOWEL PROBLEMS (unusual diarrhea, constipation, pain near the anus) TENDERNESS IN MOUTH AND THROAT WITH OR WITHOUT PRESENCE OF ULCERS (sore throat, sores in mouth, or a toothache) UNUSUAL RASH, SWELLING OR PAIN  UNUSUAL VAGINAL DISCHARGE OR ITCHING   Items with * indicate a potential emergency and should be followed up as soon as possible or go to the Emergency Department if any problems should occur.  Please show the CHEMOTHERAPY ALERT CARD or IMMUNOTHERAPY ALERT CARD at  check-in to the Emergency Department and triage nurse.  Should you have questions after your visit or need to cancel or reschedule your appointment, please contact [facility]  Dept: [phone]  and follow the prompts.  Office hours are 8:00 a.m. to 4:30 p.m. Monday - Friday. Please note that voicemails left after 4:00 p.m. may not be returned until the following business day.  We are closed weekends and major holidays. You have access to a nurse at all times for urgent questions. Please call the main number to the clinic Dept: [phone] and follow the prompts.   For any non-urgent questions, you may also contact your provider using MyChart. We now offer e-Visits for anyone 76 and older to request care online for non-urgent symptoms. For details visit [email or web address].   Also download the MyChart app! Go to the app store, search "MyChart", open the app, select [facility], and log in with your MyChart username and password.  Masks are optional in the cancer centers. If you would like for your care team to wear a mask while they are taking care of you, please let them know. You may have one support person who is at least 76 years old accompany you for your appointments.  Panitumumab Injection What is this medication? PANITUMUMAB (pan i TOOM ue mab) treats colorectal cancer. It works by blocking a protein that causes cancer cells to grow and multiply. This helps to slow or stop the spread of cancer cells. It is a monoclonal antibody. This medicine may be used for other purposes; ask your health care provider or  pharmacist if you have questions. COMMON BRAND NAME(S): Vectibix What should I tell my care team before I take this medication? They need to know if you have any of these conditions: Eye disease Low levels of magnesium in the blood Lung disease An unusual or allergic reaction to panitumumab, other medications, foods, dyes, or preservatives Pregnant or  trying to get pregnant Breast-feeding How should I use this medication? This medication is injected into a vein. It is given by your care team in a hospital or clinic setting. Talk to your care team about the use of this medication in children. Special care may be needed. Overdosage: If you think you have taken too much of this medicine contact a poison control center or emergency room at once. NOTE: This medicine is only for you. Do not share this medicine with others. What if I miss a dose? Keep appointments for follow-up doses. It is important not to miss your dose. Call your care team if you are unable to keep an appointment. What may interact with this medication? Bevacizumab This list may not describe all possible interactions. Give your health care provider a list of all the medicines, herbs, non-prescription drugs, or dietary supplements you use. Also tell them if you smoke, drink alcohol, or use illegal drugs. Some items may interact with your medicine. What should I watch for while using this medication? Your condition will be monitored carefully while you are receiving this medication. This medication may make you feel generally unwell. This is not uncommon as chemotherapy can affect healthy cells as well as cancer cells. Report any side effects. Continue your course of treatment even though you feel ill unless your care team tells you to stop. This medication can make you more sensitive to the sun. Keep out of the sun while receiving this medication and for 2 months after stopping therapy. If you cannot avoid being in the sun, wear protective clothing and sunscreen. Do not use sun lamps, tanning beds, or tanning booths. Check with your care team if you have severe diarrhea, nausea, and vomiting or if you sweat a lot. The loss of too much body fluid may make it dangerous for you to take this medication. This medication may cause serious skin reactions. They can happen weeks to months  after starting the medication. Contact your care team right away if you notice fevers or flu-like symptoms with a rash. The rash may be red or purple and then turn into blisters or peeling of the skin. You may also notice a red rash with swelling of the face, lips, or lymph nodes in your neck or under your arms. Talk to your care team if you may be pregnant. Serious birth defects can occur if you take this medication during pregnancy and for 2 months after the last dose. Contraception is recommended while taking this medication and for 2 months after the last dose. Your care team can help you find the option that works for you. Do not breastfeed while taking this medication and for 2 months after the last dose. This medication may cause infertility. Talk to your care team if you are concerned about your fertility. What side effects may I notice from receiving this medication? Side effects that you should report to your care team as soon as possible: Allergic reactions--skin rash, itching, hives, swelling of the face, lips, tongue, or throat Dry cough, shortness of breath or trouble breathing Eye pain, redness, irritation, or discharge with blurry or decreased vision Infusion  reactions--chest pain, shortness of breath or trouble breathing, feeling faint or lightheaded Low magnesium level--muscle pain or cramps, unusual weakness or fatigue, fast or irregular heartbeat, tremors Low potassium level--muscle pain or cramps, unusual weakness or fatigue, fast or irregular heartbeat, constipation Redness, blistering, peeling, or loosening of the skin, including inside the mouth Skin reactions on sun-exposed areas Side effects that usually do not require medical attention (report to your care team if they continue or are bothersome): Change in nail shape, thickness, or color Diarrhea Dry skin Fatigue Nausea Vomiting This list may not describe all possible side effects. Call your doctor for medical advice  about side effects. You may report side effects to FDA at 1-800-FDA-1088. Where should I keep my medication? This medication is given in a hospital or clinic. It will not be stored at home. NOTE: This sheet is a summary. It may not cover all possible information. If you have questions about this medicine, talk to your doctor, pharmacist, or health care provider.  2023 Elsevier/Gold Standard (2021-12-14 00:00:00)  Magnesium Sulfate Injection What is this medication? MAGNESIUM SULFATE (mag NEE zee um SUL fate) prevents and treats low levels of magnesium in your body. It may also be used to prevent and treat seizures during pregnancy in people with high blood pressure disorders, such as preeclampsia or eclampsia. Magnesium plays an important role in maintaining the health of your muscles and nervous system. This medicine may be used for other purposes; ask your health care provider or pharmacist if you have questions. What should I tell my care team before I take this medication? They need to know if you have any of these conditions: Heart disease History of irregular heart beat Kidney disease An unusual or allergic reaction to magnesium sulfate, medications, foods, dyes, or preservatives Pregnant or trying to get pregnant Breast-feeding How should I use this medication? This medication is for infusion into a vein. It is given in a hospital or clinic setting. Talk to your care team about the use of this medication in children. While this medication may be prescribed for selected conditions, precautions do apply. Overdosage: If you think you have taken too much of this medicine contact a poison control center or emergency room at once. NOTE: This medicine is only for you. Do not share this medicine with others. What if I miss a dose? This does not apply. What may interact with this medication? Certain medications for anxiety or sleep Certain medications for seizures, such  phenobarbital Digoxin Medications that relax muscles for surgery Narcotic medications for pain This list may not describe all possible interactions. Give your health care provider a list of all the medicines, herbs, non-prescription drugs, or dietary supplements you use. Also tell them if you smoke, drink alcohol, or use illegal drugs. Some items may interact with your medicine. What should I watch for while using this medication? Your condition will be monitored carefully while you are receiving this medication. You may need blood work done while you are receiving this medication. What side effects may I notice from receiving this medication? Side effects that you should report to your care team as soon as possible: Allergic reactions--skin rash, itching, hives, swelling of the face, lips, tongue, or throat High magnesium level--confusion, drowsiness, facial flushing, redness, sweating, muscle weakness, fast or irregular heartbeat, trouble breathing Low blood pressure--dizziness, feeling faint or lightheaded, blurry vision Side effects that usually do not require medical attention (report to your care team if they continue or are bothersome): Headache Nausea  This list may not describe all possible side effects. Call your doctor for medical advice about side effects. You may report side effects to FDA at 1-800-FDA-1088. Where should I keep my medication? This medication is given in a hospital or clinic and will not be stored at home. NOTE: This sheet is a summary. It may not cover all possible information. If you have questions about this medicine, talk to your doctor, pharmacist, or health care provider.  2023 Elsevier/Gold Standard (2012-12-06 00:00:00)

## 2022-07-05 ENCOUNTER — Other Ambulatory Visit: Payer: Self-pay | Admitting: Oncology

## 2022-07-05 DIAGNOSIS — D649 Anemia, unspecified: Secondary | ICD-10-CM

## 2022-07-10 ENCOUNTER — Other Ambulatory Visit: Payer: Self-pay | Admitting: Oncology

## 2022-07-10 DIAGNOSIS — C182 Malignant neoplasm of ascending colon: Secondary | ICD-10-CM

## 2022-07-14 ENCOUNTER — Inpatient Hospital Stay (HOSPITAL_BASED_OUTPATIENT_CLINIC_OR_DEPARTMENT_OTHER): Payer: BC Managed Care – PPO | Admitting: Nurse Practitioner

## 2022-07-14 ENCOUNTER — Inpatient Hospital Stay: Payer: BC Managed Care – PPO | Attending: Oncology

## 2022-07-14 ENCOUNTER — Inpatient Hospital Stay: Payer: BC Managed Care – PPO

## 2022-07-14 ENCOUNTER — Encounter: Payer: Self-pay | Admitting: Nurse Practitioner

## 2022-07-14 ENCOUNTER — Telehealth: Payer: Self-pay | Admitting: *Deleted

## 2022-07-14 VITALS — BP 154/50 | HR 68 | Temp 98.2°F | Resp 20 | Ht 67.0 in | Wt 137.0 lb

## 2022-07-14 VITALS — BP 155/55 | HR 72

## 2022-07-14 DIAGNOSIS — D649 Anemia, unspecified: Secondary | ICD-10-CM

## 2022-07-14 DIAGNOSIS — C787 Secondary malignant neoplasm of liver and intrahepatic bile duct: Secondary | ICD-10-CM | POA: Diagnosis not present

## 2022-07-14 DIAGNOSIS — D500 Iron deficiency anemia secondary to blood loss (chronic): Secondary | ICD-10-CM | POA: Insufficient documentation

## 2022-07-14 DIAGNOSIS — C182 Malignant neoplasm of ascending colon: Secondary | ICD-10-CM | POA: Diagnosis not present

## 2022-07-14 DIAGNOSIS — Z5112 Encounter for antineoplastic immunotherapy: Secondary | ICD-10-CM | POA: Insufficient documentation

## 2022-07-14 LAB — CMP (CANCER CENTER ONLY)
ALT: 9 U/L (ref 0–44)
AST: 13 U/L — ABNORMAL LOW (ref 15–41)
Albumin: 3.8 g/dL (ref 3.5–5.0)
Alkaline Phosphatase: 91 U/L (ref 38–126)
Anion gap: 7 (ref 5–15)
BUN: 22 mg/dL (ref 8–23)
CO2: 23 mmol/L (ref 22–32)
Calcium: 9.3 mg/dL (ref 8.9–10.3)
Chloride: 110 mmol/L (ref 98–111)
Creatinine: 0.69 mg/dL (ref 0.44–1.00)
GFR, Estimated: >60 mL/min (ref >60)
Glucose, Bld: 87 mg/dL (ref 70–99)
Potassium: 4.6 mmol/L (ref 3.5–5.1)
Sodium: 140 mmol/L (ref 135–145)
Total Bilirubin: 0.4 mg/dL (ref 0.3–1.2)
Total Protein: 6.7 g/dL (ref 6.5–8.1)

## 2022-07-14 LAB — CBC WITH DIFFERENTIAL (CANCER CENTER ONLY)
Abs Immature Granulocytes: 0.01 10*3/uL (ref 0.00–0.07)
Basophils Absolute: 0 10*3/uL (ref 0.0–0.1)
Basophils Relative: 1 %
Eosinophils Absolute: 0.2 10*3/uL (ref 0.0–0.5)
Eosinophils Relative: 3 %
HCT: 30.1 % — ABNORMAL LOW (ref 36.0–46.0)
Hemoglobin: 9.3 g/dL — ABNORMAL LOW (ref 12.0–15.0)
Immature Granulocytes: 0 %
Lymphocytes Relative: 21 %
Lymphs Abs: 1.1 10*3/uL (ref 0.7–4.0)
MCH: 28.3 pg (ref 26.0–34.0)
MCHC: 30.9 g/dL (ref 30.0–36.0)
MCV: 91.5 fL (ref 80.0–100.0)
Monocytes Absolute: 0.6 10*3/uL (ref 0.1–1.0)
Monocytes Relative: 13 %
Neutro Abs: 3.1 10*3/uL (ref 1.7–7.7)
Neutrophils Relative %: 62 %
Platelet Count: 158 10*3/uL (ref 150–400)
RBC: 3.29 MIL/uL — ABNORMAL LOW (ref 3.87–5.11)
RDW: 15.7 % — ABNORMAL HIGH (ref 11.5–15.5)
WBC Count: 5 10*3/uL (ref 4.0–10.5)
nRBC: 0 % (ref 0.0–0.2)

## 2022-07-14 LAB — CEA (ACCESS): CEA (CHCC): 9.5 ng/mL — ABNORMAL HIGH (ref 0.00–5.00)

## 2022-07-14 LAB — MAGNESIUM: Magnesium: 1.6 mg/dL — ABNORMAL LOW (ref 1.7–2.4)

## 2022-07-14 MED ORDER — HEPARIN SOD (PORK) LOCK FLUSH 100 UNIT/ML IV SOLN
500.0000 [IU] | Freq: Once | INTRAVENOUS | Status: AC | PRN
  Administered 2022-07-14: 500 [IU]

## 2022-07-14 MED ORDER — SODIUM CHLORIDE 0.9% FLUSH
10.0000 mL | INTRAVENOUS | Status: DC | PRN
  Administered 2022-07-14: 10 mL

## 2022-07-14 MED ORDER — OXYCODONE-ACETAMINOPHEN 5-325 MG PO TABS: 1.0000 | ORAL_TABLET | Freq: Three times a day (TID) | ORAL | 0 refills | Status: DC | PRN

## 2022-07-14 MED ORDER — MAGNESIUM SULFATE 2 GM/50ML IV SOLN
2.0000 g | Freq: Once | INTRAVENOUS | Status: AC
  Administered 2022-07-14: 2 g via INTRAVENOUS
  Filled 2022-07-14: qty 50

## 2022-07-14 MED ORDER — ENOXAPARIN SODIUM 40 MG/0.4ML IJ SOSY: PREFILLED_SYRINGE | INTRAMUSCULAR | 5 refills | Status: DC

## 2022-07-14 MED ORDER — MAGNESIUM OXIDE -MG SUPPLEMENT 400 (240 MG) MG PO TABS: 400.0000 mg | ORAL_TABLET | Freq: Two times a day (BID) | ORAL | 2 refills | Status: DC

## 2022-07-14 MED ORDER — DOXYCYCLINE HYCLATE 100 MG PO TABS: 100.0000 mg | ORAL_TABLET | Freq: Two times a day (BID) | ORAL | 2 refills | Status: DC

## 2022-07-14 MED ORDER — SODIUM CHLORIDE 0.9 % IV SOLN: Freq: Once | INTRAVENOUS | Status: AC

## 2022-07-14 MED ORDER — SODIUM CHLORIDE 0.9 % IV SOLN
6.0000 mg/kg | Freq: Once | INTRAVENOUS | Status: AC
  Administered 2022-07-14: 400 mg via INTRAVENOUS
  Filled 2022-07-14: qty 20

## 2022-07-14 NOTE — Patient Instructions (Signed)
Stacy Burns   Discharge Instructions: Thank you for choosing [facility] to provide your oncology and hematology care.   If you have a lab appointment with the [facility], please go directly to the [facility] and check in at the registration area.   Wear comfortable clothing and clothing appropriate for easy access to any Portacath or PICC line.   We strive to give you quality time with your provider. You may need to reschedule your appointment if you arrive late (15 or more minutes).  Arriving late affects you and other patients whose appointments are after yours.  Also, if you miss three or more appointments without notifying the office, you may be dismissed from the clinic at the provider's discretion.      For prescription refill requests, have your pharmacy contact our office and allow 72 hours for refills to be completed.    Today you received the following chemotherapy and/or immunotherapy agents Vectibix      To help prevent nausea and vomiting after your treatment, we encourage you to take your nausea medication as directed.  BELOW ARE SYMPTOMS THAT SHOULD BE REPORTED IMMEDIATELY: *FEVER GREATER THAN 100.4 F (38 C) OR HIGHER *CHILLS OR SWEATING *NAUSEA AND VOMITING THAT IS NOT CONTROLLED WITH YOUR NAUSEA MEDICATION *UNUSUAL SHORTNESS OF BREATH *UNUSUAL BRUISING OR BLEEDING *URINARY PROBLEMS (pain or burning when urinating, or frequent urination) *BOWEL PROBLEMS (unusual diarrhea, constipation, pain near the anus) TENDERNESS IN MOUTH AND THROAT WITH OR WITHOUT PRESENCE OF ULCERS (sore throat, sores in mouth, or a toothache) UNUSUAL RASH, SWELLING OR PAIN  UNUSUAL VAGINAL DISCHARGE OR ITCHING   Items with * indicate a potential emergency and should be followed up as soon as possible or go to the Emergency Department if any problems should occur.  Please show the CHEMOTHERAPY ALERT CARD or IMMUNOTHERAPY ALERT CARD at check-in to the  Emergency Department and triage nurse.  Should you have questions after your visit or need to cancel or reschedule your appointment, please contact [facility]  Dept: [phone]  and follow the prompts.  Office hours are 8:00 a.m. to 4:30 p.m. Monday - Friday. Please note that voicemails left after 4:00 p.m. may not be returned until the following business day.  We are closed weekends and major holidays. You have access to a nurse at all times for urgent questions. Please call the main number to the clinic Dept: [phone] and follow the prompts.   For any non-urgent questions, you may also contact your provider using MyChart. We now offer e-Visits for anyone 79 and older to request care online for non-urgent symptoms. For details visit [email or web address].   Also download the MyChart app! Go to the app store, search "MyChart", open the app, select [facility], and log in with your MyChart username and password.  Masks are optional in the cancer centers. If you would like for your care team to wear a mask while they are taking care of you, please let them know. You may have one support person who is at least 76 years old accompany you for your appointments.  Panitumumab Injection What is this medication? PANITUMUMAB (pan i TOOM ue mab) treats colorectal cancer. It works by blocking a protein that causes cancer cells to grow and multiply. This helps to slow or stop the spread of cancer cells. It is a monoclonal antibody. This medicine may be used for other purposes; ask your health care provider or pharmacist  if you have questions. COMMON BRAND NAME(S): Vectibix What should I tell my care team before I take this medication? They need to know if you have any of these conditions: Eye disease Low levels of magnesium in the blood Lung disease An unusual or allergic reaction to panitumumab, other medications, foods, dyes, or preservatives Pregnant or trying to get  pregnant Breast-feeding How should I use this medication? This medication is injected into a vein. It is given by your care team in a hospital or clinic setting. Talk to your care team about the use of this medication in children. Special care may be needed. Overdosage: If you think you have taken too much of this medicine contact a poison control center or emergency room at once. NOTE: This medicine is only for you. Do not share this medicine with others. What if I miss a dose? Keep appointments for follow-up doses. It is important not to miss your dose. Call your care team if you are unable to keep an appointment. What may interact with this medication? Bevacizumab This list may not describe all possible interactions. Give your health care provider a list of all the medicines, herbs, non-prescription drugs, or dietary supplements you use. Also tell them if you smoke, drink alcohol, or use illegal drugs. Some items may interact with your medicine. What should I watch for while using this medication? Your condition will be monitored carefully while you are receiving this medication. This medication may make you feel generally unwell. This is not uncommon as chemotherapy can affect healthy cells as well as cancer cells. Report any side effects. Continue your course of treatment even though you feel ill unless your care team tells you to stop. This medication can make you more sensitive to the sun. Keep out of the sun while receiving this medication and for 2 months after stopping therapy. If you cannot avoid being in the sun, wear protective clothing and sunscreen. Do not use sun lamps, tanning beds, or tanning booths. Check with your care team if you have severe diarrhea, nausea, and vomiting or if you sweat a lot. The loss of too much body fluid may make it dangerous for you to take this medication. This medication may cause serious skin reactions. They can happen weeks to months after starting the  medication. Contact your care team right away if you notice fevers or flu-like symptoms with a rash. The rash may be red or purple and then turn into blisters or peeling of the skin. You may also notice a red rash with swelling of the face, lips, or lymph nodes in your neck or under your arms. Talk to your care team if you may be pregnant. Serious birth defects can occur if you take this medication during pregnancy and for 2 months after the last dose. Contraception is recommended while taking this medication and for 2 months after the last dose. Your care team can help you find the option that works for you. Do not breastfeed while taking this medication and for 2 months after the last dose. This medication may cause infertility. Talk to your care team if you are concerned about your fertility. What side effects may I notice from receiving this medication? Side effects that you should report to your care team as soon as possible: Allergic reactions--skin rash, itching, hives, swelling of the face, lips, tongue, or throat Dry cough, shortness of breath or trouble breathing Eye pain, redness, irritation, or discharge with blurry or decreased vision Infusion reactions--chest  pain, shortness of breath or trouble breathing, feeling faint or lightheaded Low magnesium level--muscle pain or cramps, unusual weakness or fatigue, fast or irregular heartbeat, tremors Low potassium level--muscle pain or cramps, unusual weakness or fatigue, fast or irregular heartbeat, constipation Redness, blistering, peeling, or loosening of the skin, including inside the mouth Skin reactions on sun-exposed areas Side effects that usually do not require medical attention (report to your care team if they continue or are bothersome): Change in nail shape, thickness, or color Diarrhea Dry skin Fatigue Nausea Vomiting This list may not describe all possible side effects. Call your doctor for medical advice about side effects.  You may report side effects to FDA at 1-800-FDA-1088. Where should I keep my medication? This medication is given in a hospital or clinic. It will not be stored at home. NOTE: This sheet is a summary. It may not cover all possible information. If you have questions about this medicine, talk to your doctor, pharmacist, or health care provider.  2023 Elsevier/Gold Standard (2021-12-14 00:00:00)  Hypomagnesemia Hypomagnesemia is a condition in which the level of magnesium in the blood is too low. Magnesium is a mineral that is found in many foods. It is used in many different processes in the body. Hypomagnesemia can affect every organ in the body. In severe cases, it can cause life-threatening problems. What are the causes? This condition may be caused by: Not getting enough magnesium in your diet or not having enough healthy foods to eat (malnutrition). Problems with magnesium absorption in the intestines. Dehydration. Excessive use of alcohol. Vomiting. Severe or long-term (chronic) diarrhea. Some medicines, including medicines that make you urinate more often (diuretics). Certain diseases, such as kidney disease, diabetes, celiac disease, and overactive thyroid. What are the signs or symptoms? Symptoms of this condition include: Loss of appetite, nausea, and vomiting. Involuntary shaking or trembling of a body part (tremor). Muscle weakness or tingling in the arms and legs. Sudden tightening of muscles (muscle spasms). Confusion. Psychiatric issues, such as: Depression and irritability. Psychosis. A feeling of fluttering of the heart (palpitations). Seizures. These symptoms are more severe if magnesium levels drop suddenly. How is this diagnosed? This condition may be diagnosed based on: Your symptoms and medical history. A physical exam. Blood and urine tests. How is this treated? Treatment depends on the cause and the severity of the condition. It may be treated by: Taking a  magnesium supplement. This can be taken in pill form. If the condition is severe, magnesium is usually given through an IV. Making changes to your diet. You may be directed to eat foods that have a lot of magnesium, such as green leafy vegetables, peas, beans, and nuts. Not drinking alcohol. If you are struggling not to drink, ask your health care provider for help. Follow these instructions at home: Eating and drinking     Make sure that your diet includes foods with magnesium. Foods that have a lot of magnesium in them include: Green leafy vegetables, such as spinach and broccoli. Beans and peas. Nuts and seeds, such as almonds and sunflower seeds. Whole grains, such as whole grain bread and fortified cereals. Drink fluids that contain salts and minerals (electrolytes), such as sports drinks, when you are active. Do not drink alcohol. General instructions Take over-the-counter and prescription medicines only as told by your health care provider. Take magnesium supplements as directed if your health care provider tells you to take them. Have your magnesium levels monitored as told by your health care provider.  Keep all follow-up visits. This is important. Contact a health care provider if: You get worse instead of better. Your symptoms return. Get help right away if: You develop severe muscle weakness. You have trouble breathing. You feel that your heart is racing. These symptoms may represent a serious problem that is an emergency. Do not wait to see if the symptoms will go away. Get medical help right away. Call your local emergency services (911 in the [place] Do not drive yourself to the hospital. Summary Hypomagnesemia is a condition in which the level of magnesium in the blood is too low. Hypomagnesemia can affect every organ in the body. Treatment may include eating more foods that contain magnesium, taking magnesium supplements, and not drinking alcohol. Have your magnesium  levels monitored as told by your health care provider. This information is not intended to replace advice given to you by your health care provider. Make sure you discuss any questions you have with your health care provider. Document Revised: 12/28/2020 Document Reviewed: 12/28/2020 Elsevier Patient Education  [place].

## 2022-07-14 NOTE — Progress Notes (Signed)
[facility] OFFICE PROGRESS NOTE   Diagnosis: Colon cancer  INTERVAL HISTORY:   Stacy Burns returns as scheduled.  She continues encorafenib and Panitumumab.  She denies nausea/vomiting.  No mouth sores.  No diarrhea.  No significant rash.  Skin is dry in general.  She has a good appetite.  Weight is stable.  No bleeding.  She reports a nodular area at the right lower leg has resolved.  Objective:  Vital signs in last 24 hours:  Blood pressure (!) 154/50, pulse 68, temperature 98.2 F (36.8 C), temperature source Oral, resp. rate 20, height _0  (1.702 m), weight 137 lb (62.1 kg), SpO2 100 %.    HEENT: No thrush or ulcers. Resp: Lungs clear bilaterally. Cardio: Regular rate and rhythm. GI: No hepatosplenomegaly.  Firm fullness right lower abdomen. Vascular: No leg edema. Skin: Mild acne type rash over the face. Port-A-Cath without erythema.  Lab Results:  Lab Results  Component Value Date   WBC 5.0 07/14/2022   HGB 9.3 (L) 07/14/2022   HCT 30.1 (L) 07/14/2022   MCV 91.5 07/14/2022   PLT 158 07/14/2022   NEUTROABS 3.1 07/14/2022    Imaging:  No results found.  Medications: I have reviewed the patient's current medications.  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  Cirrhosis.  Innumerable liver lesions. Colonoscopy 12/18/2019-ulcerated partially obstructing large mass in the mid ascending colon.  Biopsy invasive adenocarcinoma; preserved expression major MMR proteins Upper endoscopy 12/18/2019-normal esophagus, stomach, examined duodenum CEA 12/19/2019-8,923 Biopsy liver lesion 12/23/2019-adenocarcinoma Foundation 1-K-ras wild-type; tumor mutation burden greater than or equal to 10; microsatellite stable; BRAF V600E Cycle 1 FOLFOX 01/01/2020 Cycle 2 FOLFOX 01/15/2020  Cycle 3 FOLFOX 01/29/2020, Udenyca added Cycle 4 FOLFOX 02/12/2020,  Udenyca held Cycle 5 FOLFOX 02/26/2020, Udenyca Cycle 6 FOLFOX 03/11/2020, Udenyca held CT abdomen/pelvis 03/16/2020-stable diffuse liver lesions, stable strictured appearing ascending colon, possible pericolonic implant, vague left lower lobe nodule Cycle 1 FOLFIRI/Avastin 04/06/2020 Cycle 2 FOLFIRI/Avastin 04/21/2020 Cycle 3 FOLFIRI/Avastin 05/06/2020 Cycle 4 FOLFIRI/Avastin 05/20/2020 Cycle 5 FOLFIRI/Avastin 06/03/2020 CTs 06/15/2020-mild to moderate improvement in hepatic metastasis.  No new or progressive disease.  Narrowing within the proximal ascending colon with upstream mild cecal and terminal ileum dilatation, similar. Cycle 6 FOLFIRI/Avastin 06/17/2020 Cycle 7 FOLFIRI/Avastin 07/01/2020 Cycle 8 FOLFIRI/Avastin 07/15/2020 Cycle 9 FOLFIRI/Avastin 07/29/2020-5-FU bolus eliminated, 5-FU infusion and irinotecan dose reduced Cycle 10 FOLFIRI/Avastin 08/12/2020 CTs 08/23/2020- mild residual wall thickening involving the cecum/ascending colon.  Numerous liver metastases improved. Cycle 11 FOLFIRI/Avastin 08/25/2020 Cycle 12 FOLFIRI/Avastin 09/16/2020 Cycle 13 FOLFIRI/Avastin 10/07/2020 Cycle 14 FOLFIRI/Avastin 10/28/2020 Cycle 15 FOLFIRI/Avastin 11/18/2020 CT abdomen/pelvis 12/06/2020-mild improvement in multifocal hepatic metastases, wall thickening at the ascending colon with pericolonic inflammatory changes Cycle 16 FOLFIRI/Avastin 12/09/2020 Cycle 17 FOLFIRI/Avastin 12/30/2020 Cycle 18 FOLFIRI/Avastin 01/20/2021 Cycle 19 FOLFIRI/Avastin 02/10/2021 Cycle 20 FOLFIRI/Avastin 03/03/2021 Cycle 21 FOLFIRI/Avastin 03/24/2021 Cycle 22 FOLFIRI/Avastin 04/14/2021 CTs 05/04/2021-slight improvement in hepatic metastatic disease. Cycle 23 FOLFIRI/Avastin 05/05/2021 Cycle 24 FOLFIRI/Avastin 05/25/2021 Cycle 25 FOLFIRI/Avastin 06/15/2021 Cycle 26 FOLFIRI/Avastin 07/13/2021 Cycle 27 FOLFIRI/Avastin 08/02/2021 CTs 08/30/2021-majority of liver lesions are unchanged in size, 1 lesion adjacent to the gallbladder has increased,  no adenopathy, persistent pericolonic stranding and peritoneal thickening at the cecum Progressive rise in CEA 08/31/2021-treatment changed to Xeloda/bevacizumab, began [place] 09/22/2021 CEA stable 11/03/2021 Xeloda/bevacizumab continued CTs 01/10/2022-increase in size of some of the liver lesions, others are stable, new tiny right lower lobe nodule, increased dilation of distal small bowel  with increased wall thickening at the terminal ileum and ileocecal valve with 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, stable bilateral paracolic gutter peritoneal thickening Panitumumab/ Encorafenib 01/12/2022 (encorafenib 01/19/2022) Panitumumab 02/02/2022 Panitumumab 02/20/2022 Panitumumab 03/06/2022 Panitumumab 03/20/2022 Panitumumab 04/03/2022 CTs 04/14/2022-decrease size of liver metastases, new borderline subcarinal adenopathy, improved peritoneal thickening, cirrhosis or pseudocirrhosis, persistent dilated cecum Encorafenib and Panitumumab continued Panitumumab 04/18/2022 Panitumumab 05/05/2022 Panitumumab 05/19/2022 Panitumumab 06/02/2022 Panitumumab 06/16/2022, 4g IV mag and begin oral mag po once daily  Panitumumab 06/30/2022 Panitumumab 07/14/2022, 2 g IV magnesium, increase oral magnesium to 400 mg twice daily 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  She continues encorafenib and every 2-week Panitumumab.  There is no clinical evidence of disease progression.  Plan to continue the same.  CBC and chemistry panel reviewed.  Labs adequate to proceed as above.  Persistent low magnesium.  She will receive IV  magnesium today and increase oral magnesium to 400 mg twice daily.  She will return for follow-up in 2 weeks.  We are available to see her sooner if needed.    Ned Card ANP/GNP-BC   07/14/2022  8:48 AM

## 2022-07-14 NOTE — Telephone Encounter (Signed)
-----   Message from Owens Shark, NP sent at 07/14/2022  2:59 PM EST ----- Please let her know Stacy Burns replied---[facility] pharmacy should help her with obtaining further encorafenib

## 2022-07-14 NOTE — Progress Notes (Signed)
Patient seen by Ned Card NP today  Vitals are within treatment parameters.  Labs reviewed by Ned Card NP and are within treatment parameters. Magnesium is 1.6.   Per physician team, patient is ready for treatment and there are NO modifications to the treatment plan.

## 2022-07-14 NOTE — Telephone Encounter (Signed)
Please let her know Alyson replied---[facility] pharmacy should help her with obtaining further encorafenib. Patient notified.

## 2022-07-15 ENCOUNTER — Other Ambulatory Visit: Payer: Self-pay

## 2022-07-15 LAB — SAMPLE TO BLOOD BANK

## 2022-07-18 ENCOUNTER — Other Ambulatory Visit: Payer: Self-pay

## 2022-07-20 IMAGING — CT CT ABD-PELV W/ CM
2 of 5 series · 15 of 46 positions shown, 17 images · IV contrast (APPLIED)
Comparison: 06/15/2020

CLINICAL DATA: Metastatic colorectal cancer

EXAM:
CT ABDOMEN AND PELVIS WITH CONTRAST
TECHNIQUE: Multidetector CT imaging of the abdomen and pelvis was performed
using the standard protocol following bolus administration of
intravenous contrast.
CONTRAST:  100mL OMNIPAQUE IOHEXOL 300 MG/ML  SOLN

[Series 2: axial st · axial · 0.79mm/px · z∈[+994,+1419]mm · 12 of 101 slices shown, 14 images]
[im 8/101  soft-tissue]
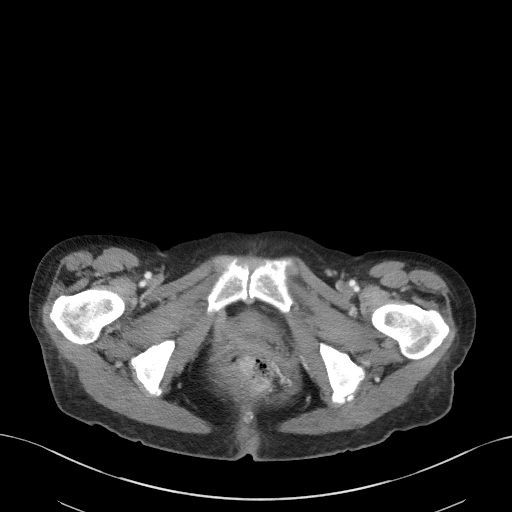
[im 8/101  bone]
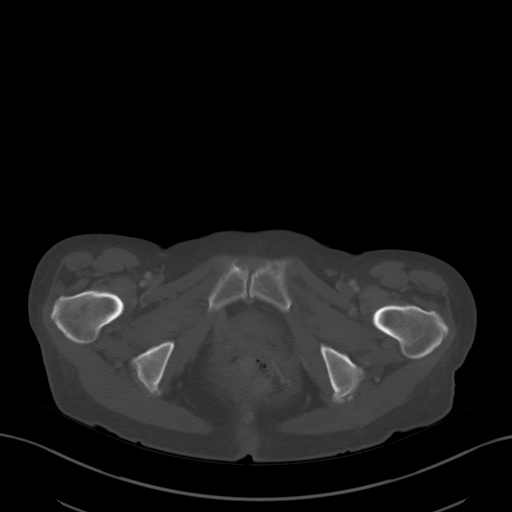
[im 16/101  soft-tissue]
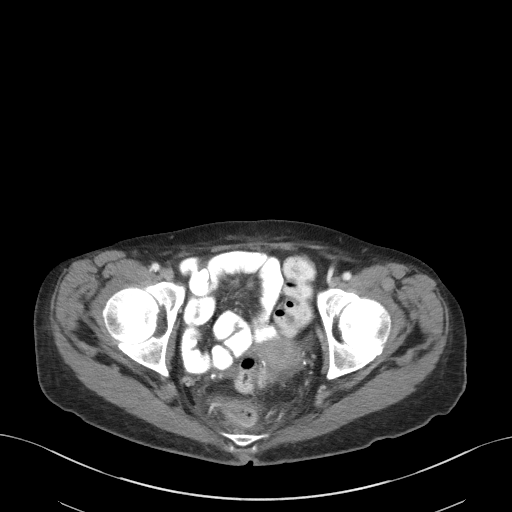
[im 24/101  soft-tissue]
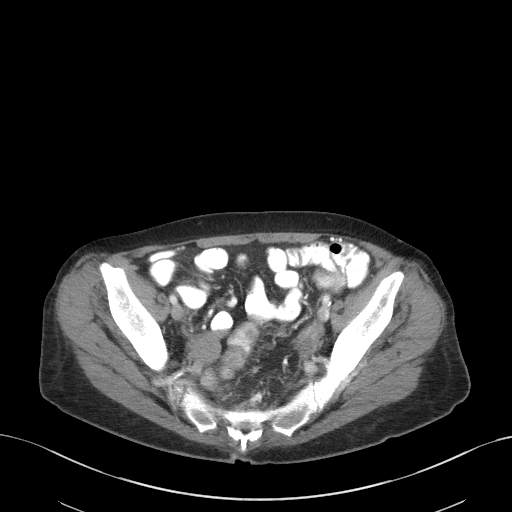
[im 31/101  soft-tissue]
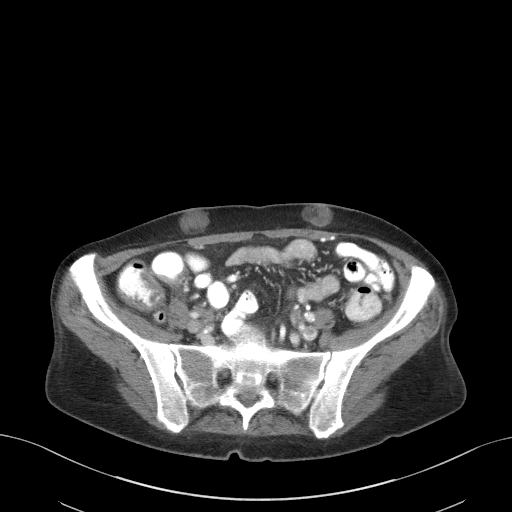
[im 39/101  soft-tissue]
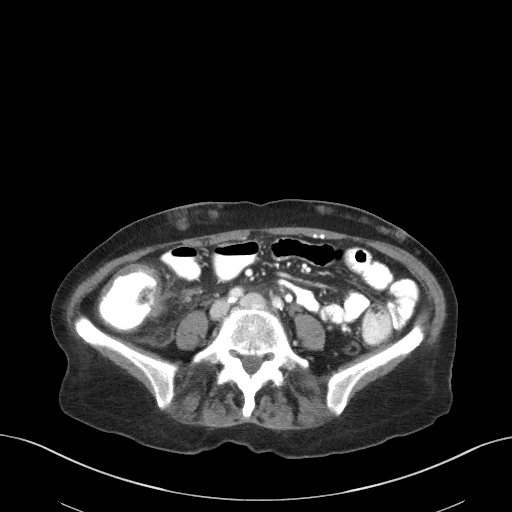
[im 47/101  soft-tissue]
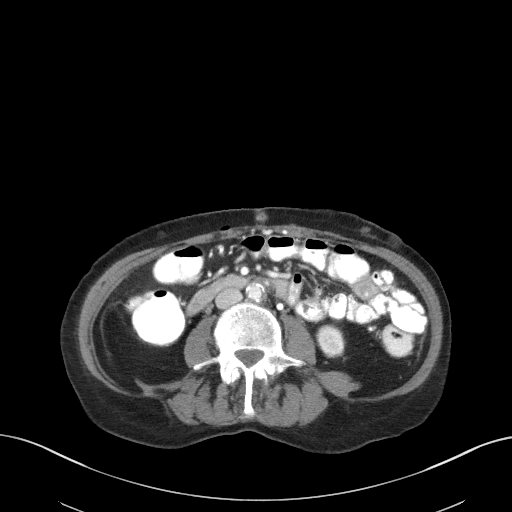
[im 54/101  soft-tissue]
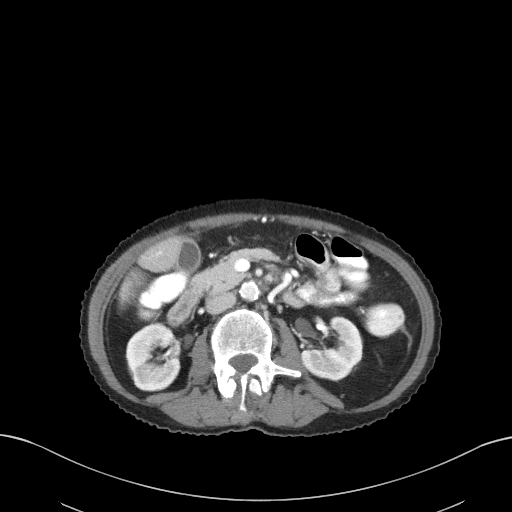
[im 62/101  soft-tissue]
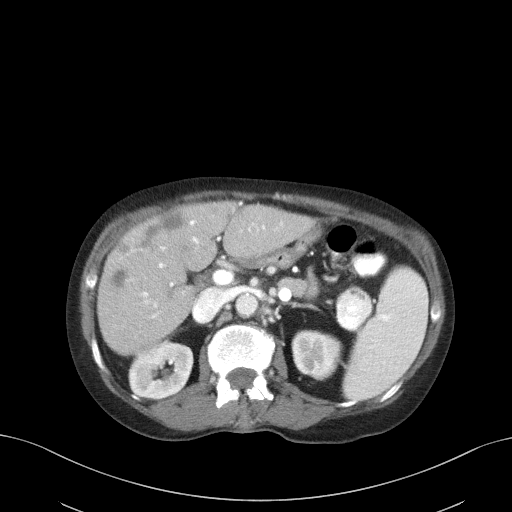
[im 70/101  soft-tissue]
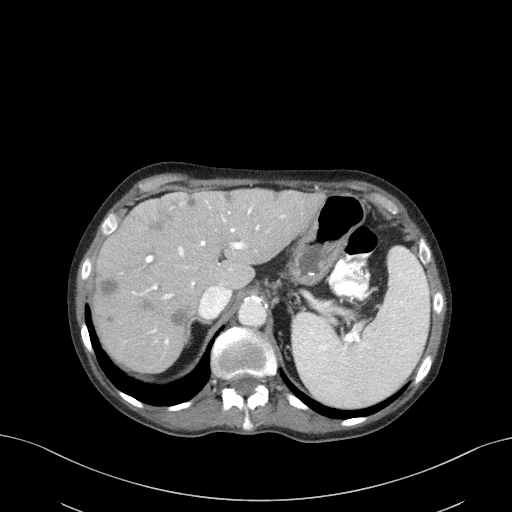
[im 70/101  bone]
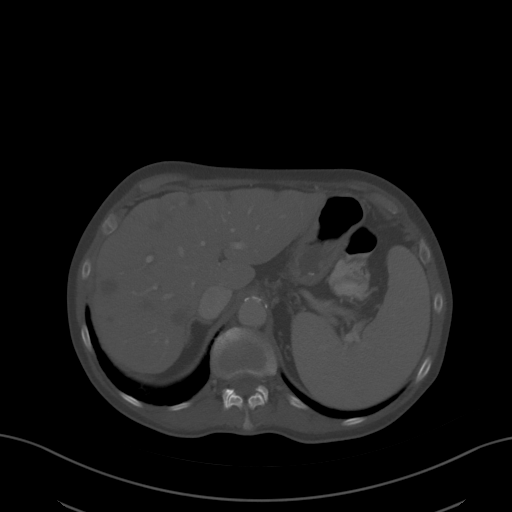
[im 77/101  soft-tissue]
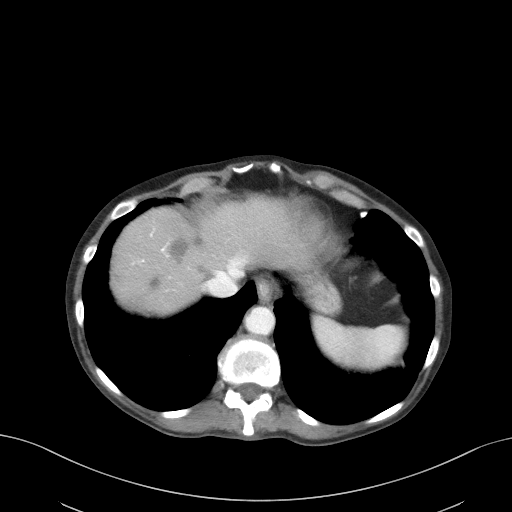
[im 85/101  soft-tissue]
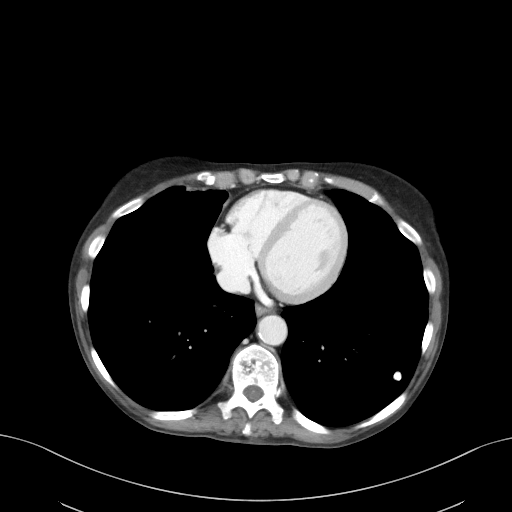
[im 93/101  soft-tissue]
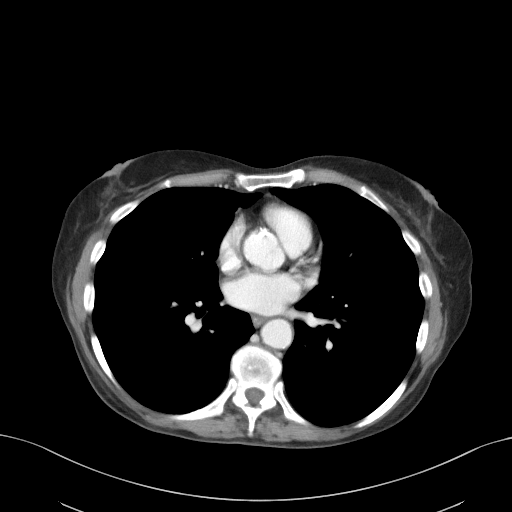

[Series 5: coronal st · coronal · 0.68mm/px · 3 of 79 slices shown]
[im 27/79  soft-tissue]
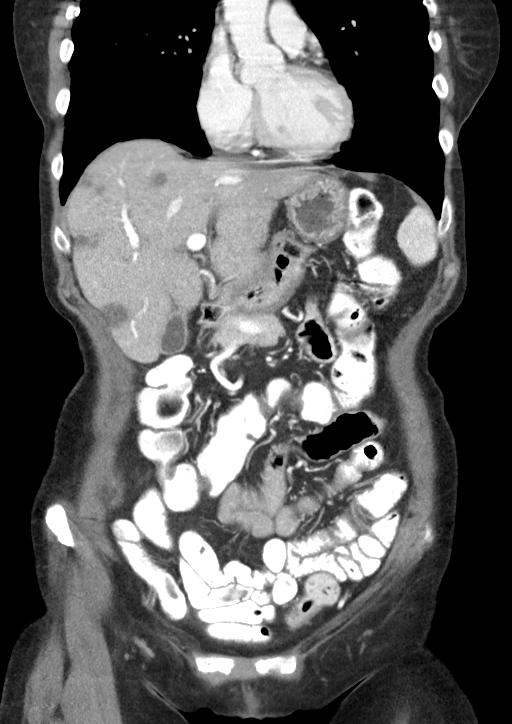
[im 35/79  soft-tissue]
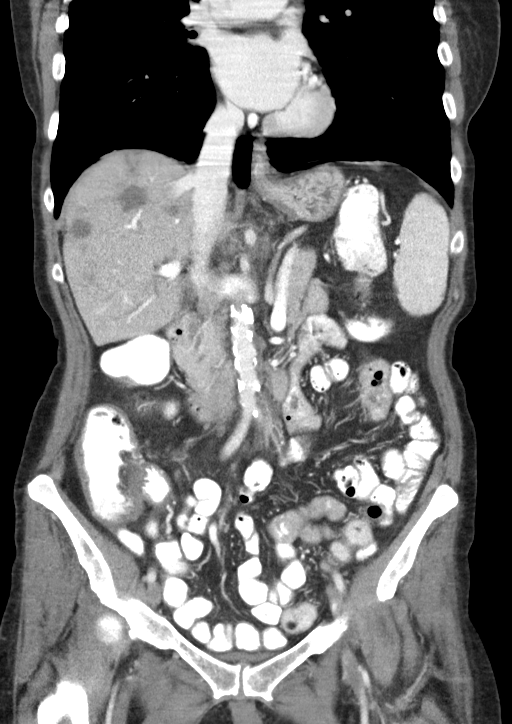
[im 44/79  soft-tissue]
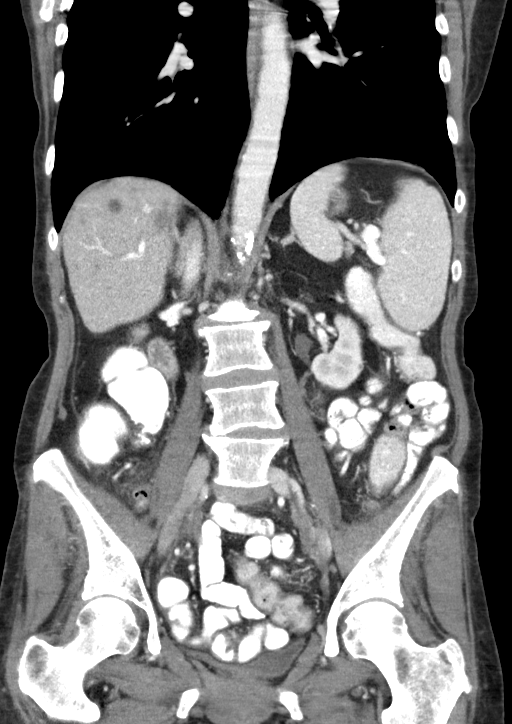

[15 of 46 positions shown; findings below may reference images not displayed]

FINDINGS: Lower chest: 7 mm calcified granuloma in the left lower lobe (series
4/image 41), unchanged.

Hepatobiliary: Numerous hepatic metastases in both lobes, improved.
Index lesions include:

--2.0 cm lesion in segment 8 (series 2/image 26), previously 2.4 cm

--1.4 cm lesion in segment 3 (series 2/image 43), previously 1.4 cm

--2.2 cm lesion in segments 5 (series 2/image 44), previously 2.6 cm

Gallbladder is unremarkable. No intrahepatic or extrahepatic ductal
dilatation.

Pancreas: Within normal limits.

Spleen: At the upper limits of normal for size.

Adrenals/Urinary Tract: Adrenal glands are within normal limits.

Kidneys are within normal limits.  No hydronephrosis.

Bladder is within normal limits.

Stomach/Bowel: Stomach is within normal limits.

No evidence of bowel obstruction.

Normal appendix (series 2/image 73).

Mild residual wall thickening involving the cecum/ascending colon
(series 2/image 64 and 67), favored to reflect the patient's primary
colonic neoplasm.

Vascular/Lymphatic: No evidence of abdominal aortic aneurysm.

Atherosclerotic calcifications of the abdominal aorta and branch
vessels.

No suspicious abdominopelvic lymphadenopathy.

Reproductive: Uterus is notable for a suspected posterior uterine
body fibroid (series 2/image 83).

Bilateral ovaries are unremarkable.

Other: No abdominopelvic ascites.

No frank peritoneal disease or mental caking.

Subcutaneous injection sites along the anterior abdominal wall (for
example, series 2/image 71).

Musculoskeletal: Mild degenerative changes of the visualized
thoracolumbar spine.
IMPRESSION: Mild residual wall thickening involving the cecum/ascending colon,
favored to reflect the patient's primary colonic neoplasm.

Numerous hepatic metastases, improved.

## 2022-07-21 ENCOUNTER — Other Ambulatory Visit: Payer: Self-pay

## 2022-07-23 ENCOUNTER — Other Ambulatory Visit: Payer: Self-pay | Admitting: Oncology

## 2022-07-27 ENCOUNTER — Other Ambulatory Visit: Payer: Self-pay | Admitting: Nurse Practitioner

## 2022-07-27 DIAGNOSIS — C182 Malignant neoplasm of ascending colon: Secondary | ICD-10-CM

## 2022-07-28 ENCOUNTER — Inpatient Hospital Stay: Payer: BC Managed Care – PPO | Admitting: Oncology

## 2022-07-28 ENCOUNTER — Inpatient Hospital Stay: Payer: BC Managed Care – PPO

## 2022-07-28 ENCOUNTER — Encounter: Payer: Self-pay | Admitting: *Deleted

## 2022-07-28 ENCOUNTER — Encounter: Payer: Self-pay | Admitting: Oncology

## 2022-07-28 ENCOUNTER — Ambulatory Visit (HOSPITAL_BASED_OUTPATIENT_CLINIC_OR_DEPARTMENT_OTHER)
Admission: RE | Admit: 2022-07-28 | Discharge: 2022-07-28 | Disposition: A | Discharge status: Home / Self Care | Payer: BC Managed Care – PPO | Source: Ambulatory Visit | Attending: Oncology | Admitting: Oncology

## 2022-07-28 VITALS — BP 141/57 | HR 63 | Temp 98.2°F | Resp 16 | Wt 138.8 lb

## 2022-07-28 DIAGNOSIS — C182 Malignant neoplasm of ascending colon: Secondary | ICD-10-CM

## 2022-07-28 DIAGNOSIS — Z5112 Encounter for antineoplastic immunotherapy: Secondary | ICD-10-CM | POA: Diagnosis not present

## 2022-07-28 DIAGNOSIS — J8410 Pulmonary fibrosis, unspecified: Secondary | ICD-10-CM | POA: Diagnosis not present

## 2022-07-28 DIAGNOSIS — K7689 Other specified diseases of liver: Secondary | ICD-10-CM | POA: Diagnosis not present

## 2022-07-28 LAB — CBC WITH DIFFERENTIAL (CANCER CENTER ONLY)
Abs Immature Granulocytes: 0.02 10*3/uL (ref 0.00–0.07)
Basophils Absolute: 0 10*3/uL (ref 0.0–0.1)
Basophils Relative: 1 %
Eosinophils Absolute: 0.2 10*3/uL (ref 0.0–0.5)
Eosinophils Relative: 3 %
HCT: 28.8 % — ABNORMAL LOW (ref 36.0–46.0)
Hemoglobin: 9 g/dL — ABNORMAL LOW (ref 12.0–15.0)
Immature Granulocytes: 0 %
Lymphocytes Relative: 15 %
Lymphs Abs: 0.8 10*3/uL (ref 0.7–4.0)
MCH: 28.7 pg (ref 26.0–34.0)
MCHC: 31.3 g/dL (ref 30.0–36.0)
MCV: 91.7 fL (ref 80.0–100.0)
Monocytes Absolute: 0.6 10*3/uL (ref 0.1–1.0)
Monocytes Relative: 13 %
Neutro Abs: 3.4 10*3/uL (ref 1.7–7.7)
Neutrophils Relative %: 68 %
Platelet Count: 148 10*3/uL — ABNORMAL LOW (ref 150–400)
RBC: 3.14 MIL/uL — ABNORMAL LOW (ref 3.87–5.11)
RDW: 15.7 % — ABNORMAL HIGH (ref 11.5–15.5)
WBC Count: 5 10*3/uL (ref 4.0–10.5)
nRBC: 0 % (ref 0.0–0.2)

## 2022-07-28 LAB — CMP (CANCER CENTER ONLY)
ALT: 11 U/L (ref 0–44)
AST: 15 U/L (ref 15–41)
Albumin: 3.6 g/dL (ref 3.5–5.0)
Alkaline Phosphatase: 93 U/L (ref 38–126)
Anion gap: 9 (ref 5–15)
BUN: 27 mg/dL — ABNORMAL HIGH (ref 8–23)
CO2: 22 mmol/L (ref 22–32)
Calcium: 9 mg/dL (ref 8.9–10.3)
Chloride: 108 mmol/L (ref 98–111)
Creatinine: 0.64 mg/dL (ref 0.44–1.00)
GFR, Estimated: >60 mL/min (ref >60)
Glucose, Bld: 87 mg/dL (ref 70–99)
Potassium: 4.1 mmol/L (ref 3.5–5.1)
Sodium: 139 mmol/L (ref 135–145)
Total Bilirubin: 0.3 mg/dL (ref 0.3–1.2)
Total Protein: 6.3 g/dL — ABNORMAL LOW (ref 6.5–8.1)

## 2022-07-28 LAB — CEA (ACCESS): CEA (CHCC): 10.52 ng/mL — ABNORMAL HIGH (ref 0.00–5.00)

## 2022-07-28 LAB — MAGNESIUM: Magnesium: 1.5 mg/dL — ABNORMAL LOW (ref 1.7–2.4)

## 2022-07-28 MED ORDER — IOHEXOL 9 MG/ML PO SOLN
1000.0000 mL | Freq: Once | ORAL | Status: AC
  Administered 2022-07-28: 1000 mL via ORAL

## 2022-07-28 MED ORDER — SODIUM CHLORIDE 0.9 % IV SOLN
6.0000 mg/kg | Freq: Once | INTRAVENOUS | Status: AC
  Administered 2022-07-28: 400 mg via INTRAVENOUS
  Filled 2022-07-28: qty 20

## 2022-07-28 MED ORDER — HEPARIN SOD (PORK) LOCK FLUSH 100 UNIT/ML IV SOLN: 500.0000 [IU] | Freq: Once | INTRAVENOUS | Status: DC | PRN

## 2022-07-28 MED ORDER — SODIUM CHLORIDE 0.9 % IV SOLN: Freq: Once | INTRAVENOUS | Status: AC

## 2022-07-28 MED ORDER — MAGNESIUM SULFATE 2 GM/50ML IV SOLN
2.0000 g | Freq: Once | INTRAVENOUS | Status: AC
  Administered 2022-07-28: 2 g via INTRAVENOUS
  Filled 2022-07-28: qty 50

## 2022-07-28 MED ORDER — IOHEXOL 300 MG/ML  SOLN
80.0000 mL | Freq: Once | INTRAMUSCULAR | Status: AC | PRN
  Administered 2022-07-28: 80 mL via INTRAVENOUS

## 2022-07-28 MED ORDER — SODIUM CHLORIDE 0.9% FLUSH: 10.0000 mL | INTRAVENOUS | Status: DC | PRN

## 2022-07-28 MED ORDER — HEPARIN SOD (PORK) LOCK FLUSH 100 UNIT/ML IV SOLN
500.0000 [IU] | Freq: Once | INTRAVENOUS | Status: AC
  Administered 2022-07-28: 500 [IU] via INTRAVENOUS

## 2022-07-28 NOTE — Patient Instructions (Signed)
[facility]   Discharge Instructions: Thank you for choosing [facility] to provide your oncology and hematology care.   If you have a lab appointment with the [facility], please go directly to the [facility] and check in at the registration area.   Wear comfortable clothing and clothing appropriate for easy access to any Portacath or PICC line.   We strive to give you quality time with your provider. You may need to reschedule your appointment if you arrive late (15 or more minutes).  Arriving late affects you and other patients whose appointments are after yours.  Also, if you miss three or more appointments without notifying the office, you may be dismissed from the clinic at the provider's discretion.      For prescription refill requests, have your pharmacy contact our office and allow 72 hours for refills to be completed.    Today you received the following chemotherapy and/or immunotherapy agents Panitumumab (VECTIBIX).      To help prevent nausea and vomiting after your treatment, we encourage you to take your nausea medication as directed.  BELOW ARE SYMPTOMS THAT SHOULD BE REPORTED IMMEDIATELY: *FEVER GREATER THAN 100.4 F (38 C) OR HIGHER *CHILLS OR SWEATING *NAUSEA AND VOMITING THAT IS NOT CONTROLLED WITH YOUR NAUSEA MEDICATION *UNUSUAL SHORTNESS OF BREATH *UNUSUAL BRUISING OR BLEEDING *URINARY PROBLEMS (pain or burning when urinating, or frequent urination) *BOWEL PROBLEMS (unusual diarrhea, constipation, pain near the anus) TENDERNESS IN MOUTH AND THROAT WITH OR WITHOUT PRESENCE OF ULCERS (sore throat, sores in mouth, or a toothache) UNUSUAL RASH, SWELLING OR PAIN  UNUSUAL VAGINAL DISCHARGE OR ITCHING   Items with * indicate a potential emergency and should be followed up as soon as possible or go to the Emergency Department if any problems should occur.  Please show the CHEMOTHERAPY ALERT CARD or IMMUNOTHERAPY ALERT CARD at  check-in to the Emergency Department and triage nurse.  Should you have questions after your visit or need to cancel or reschedule your appointment, please contact [facility]  Dept: [phone]  and follow the prompts.  Office hours are 8:00 a.m. to 4:30 p.m. Monday - Friday. Please note that voicemails left after 4:00 p.m. may not be returned until the following business day.  We are closed weekends and major holidays. You have access to a nurse at all times for urgent questions. Please call the main number to the clinic Dept: [phone] and follow the prompts.   For any non-urgent questions, you may also contact your provider using MyChart. We now offer e-Visits for anyone 76 and older to request care online for non-urgent symptoms. For details visit [email or web address].   Also download the MyChart app! Go to the app store, search "MyChart", open the app, select [facility], and log in with your MyChart username and password.  Masks are optional in the cancer centers. If you would like for your care team to wear a mask while they are taking care of you, please let them know. You may have one support person who is at least 76 years old accompany you for your appointments.  Panitumumab Injection What is this medication? PANITUMUMAB (pan i TOOM ue mab) treats colorectal cancer. It works by blocking a protein that causes cancer cells to grow and multiply. This helps to slow or stop the spread of cancer cells. It is a monoclonal antibody. This medicine may be used for other purposes; ask your health care provider or  pharmacist if you have questions. COMMON BRAND NAME(S): Vectibix What should I tell my care team before I take this medication? They need to know if you have any of these conditions: Eye disease Low levels of magnesium in the blood Lung disease An unusual or allergic reaction to panitumumab, other medications, foods, dyes, or preservatives Pregnant or  trying to get pregnant Breast-feeding How should I use this medication? This medication is injected into a vein. It is given by your care team in a hospital or clinic setting. Talk to your care team about the use of this medication in children. Special care may be needed. Overdosage: If you think you have taken too much of this medicine contact a poison control center or emergency room at once. NOTE: This medicine is only for you. Do not share this medicine with others. What if I miss a dose? Keep appointments for follow-up doses. It is important not to miss your dose. Call your care team if you are unable to keep an appointment. What may interact with this medication? Bevacizumab This list may not describe all possible interactions. Give your health care provider a list of all the medicines, herbs, non-prescription drugs, or dietary supplements you use. Also tell them if you smoke, drink alcohol, or use illegal drugs. Some items may interact with your medicine. What should I watch for while using this medication? Your condition will be monitored carefully while you are receiving this medication. This medication may make you feel generally unwell. This is not uncommon as chemotherapy can affect healthy cells as well as cancer cells. Report any side effects. Continue your course of treatment even though you feel ill unless your care team tells you to stop. This medication can make you more sensitive to the sun. Keep out of the sun while receiving this medication and for 2 months after stopping therapy. If you cannot avoid being in the sun, wear protective clothing and sunscreen. Do not use sun lamps, tanning beds, or tanning booths. Check with your care team if you have severe diarrhea, nausea, and vomiting or if you sweat a lot. The loss of too much body fluid may make it dangerous for you to take this medication. This medication may cause serious skin reactions. They can happen weeks to months  after starting the medication. Contact your care team right away if you notice fevers or flu-like symptoms with a rash. The rash may be red or purple and then turn into blisters or peeling of the skin. You may also notice a red rash with swelling of the face, lips, or lymph nodes in your neck or under your arms. Talk to your care team if you may be pregnant. Serious birth defects can occur if you take this medication during pregnancy and for 2 months after the last dose. Contraception is recommended while taking this medication and for 2 months after the last dose. Your care team can help you find the option that works for you. Do not breastfeed while taking this medication and for 2 months after the last dose. This medication may cause infertility. Talk to your care team if you are concerned about your fertility. What side effects may I notice from receiving this medication? Side effects that you should report to your care team as soon as possible: Allergic reactions--skin rash, itching, hives, swelling of the face, lips, tongue, or throat Dry cough, shortness of breath or trouble breathing Eye pain, redness, irritation, or discharge with blurry or decreased vision Infusion  reactions--chest pain, shortness of breath or trouble breathing, feeling faint or lightheaded Low magnesium level--muscle pain or cramps, unusual weakness or fatigue, fast or irregular heartbeat, tremors Low potassium level--muscle pain or cramps, unusual weakness or fatigue, fast or irregular heartbeat, constipation Redness, blistering, peeling, or loosening of the skin, including inside the mouth Skin reactions on sun-exposed areas Side effects that usually do not require medical attention (report to your care team if they continue or are bothersome): Change in nail shape, thickness, or color Diarrhea Dry skin Fatigue Nausea Vomiting This list may not describe all possible side effects. Call your doctor for medical advice  about side effects. You may report side effects to FDA at 1-800-FDA-1088. Where should I keep my medication? This medication is given in a hospital or clinic. It will not be stored at home. NOTE: This sheet is a summary. It may not cover all possible information. If you have questions about this medicine, talk to your doctor, pharmacist, or health care provider.  2023 Elsevier/Gold Standard (2021-12-14 00:00:00)  Magnesium Sulfate Injection What is this medication? MAGNESIUM SULFATE (mag NEE zee um SUL fate) prevents and treats low levels of magnesium in your body. It may also be used to prevent and treat seizures during pregnancy in people with high blood pressure disorders, such as preeclampsia or eclampsia. Magnesium plays an important role in maintaining the health of your muscles and nervous system. This medicine may be used for other purposes; ask your health care provider or pharmacist if you have questions. What should I tell my care team before I take this medication? They need to know if you have any of these conditions: Heart disease History of irregular heart beat Kidney disease An unusual or allergic reaction to magnesium sulfate, medications, foods, dyes, or preservatives Pregnant or trying to get pregnant Breast-feeding How should I use this medication? This medication is for infusion into a vein. It is given in a hospital or clinic setting. Talk to your care team about the use of this medication in children. While this medication may be prescribed for selected conditions, precautions do apply. Overdosage: If you think you have taken too much of this medicine contact a poison control center or emergency room at once. NOTE: This medicine is only for you. Do not share this medicine with others. What if I miss a dose? This does not apply. What may interact with this medication? Certain medications for anxiety or sleep Certain medications for seizures, such  phenobarbital Digoxin Medications that relax muscles for surgery Narcotic medications for pain This list may not describe all possible interactions. Give your health care provider a list of all the medicines, herbs, non-prescription drugs, or dietary supplements you use. Also tell them if you smoke, drink alcohol, or use illegal drugs. Some items may interact with your medicine. What should I watch for while using this medication? Your condition will be monitored carefully while you are receiving this medication. You may need blood work done while you are receiving this medication. What side effects may I notice from receiving this medication? Side effects that you should report to your care team as soon as possible: Allergic reactions--skin rash, itching, hives, swelling of the face, lips, tongue, or throat High magnesium level--confusion, drowsiness, facial flushing, redness, sweating, muscle weakness, fast or irregular heartbeat, trouble breathing Low blood pressure--dizziness, feeling faint or lightheaded, blurry vision Side effects that usually do not require medical attention (report to your care team if they continue or are bothersome): Headache Nausea  This list may not describe all possible side effects. Call your doctor for medical advice about side effects. You may report side effects to FDA at 1-800-FDA-1088. Where should I keep my medication? This medication is given in a hospital or clinic and will not be stored at home. NOTE: This sheet is a summary. It may not cover all possible information. If you have questions about this medicine, talk to your doctor, pharmacist, or health care provider.  2023 Elsevier/Gold Standard (2012-12-06 00:00:00)

## 2022-07-28 NOTE — Progress Notes (Signed)
[facility] OFFICE PROGRESS NOTE   Diagnosis: Colon cancer  INTERVAL HISTORY:   [name] returns as scheduled.  She continues treatment with encorafenib and panitumumab.  No diarrhea.  Dry skin, but no rash.  No bleeding or symptom of thrombosis.  She continues Lovenox.  Bruising at the forearms has improved.  She is working.  Objective:  Vital signs in last 24 hours:  Blood pressure (!) 141/57, pulse 63, temperature 98.2 F (36.8 C), temperature source Tympanic, resp. rate 16, weight 138 lb 12.8 oz (63 kg), SpO2 100 %.    HEENT: No thrush or ulcers Resp: Lungs clear bilaterally Cardio: Regular rate and rhythm GI: No hepatosplenomegaly, no mass, nontender Vascular: No leg edema Skin: Dryness over the back, ecchymoses at the dorsum of the hands and forearms  Portacath/PICC-without erythema  Lab Results:  Lab Results  Component Value Date   WBC 5.0 07/28/2022   HGB 9.0 (L) 07/28/2022   HCT 28.8 (L) 07/28/2022   MCV 91.7 07/28/2022   PLT 148 (L) 07/28/2022   NEUTROABS 3.4 07/28/2022    CMP  Lab Results  Component Value Date   NA 139 07/28/2022   K 4.1 07/28/2022   CL 108 07/28/2022   CO2 22 07/28/2022   GLUCOSE 87 07/28/2022   BUN 27 (H) 07/28/2022   CREATININE 0.64 07/28/2022   CALCIUM 9.0 07/28/2022   PROT 6.3 (L) 07/28/2022   ALBUMIN 3.6 07/28/2022   AST 15 07/28/2022   ALT 11 07/28/2022   ALKPHOS 93 07/28/2022   BILITOT 0.3 07/28/2022   GFRNONAA >60 07/28/2022   GFRAA >60 05/06/2020    Lab Results  Component Value Date   CEA1 54.57 (H) 12/30/2020   CEA 9.50 (H) 07/14/2022     Medications: I have reviewed the patient's current medications.   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  Cirrhosis.  Innumerable liver lesions. Colonoscopy 12/18/2019-ulcerated partially obstructing large mass in the mid ascending  colon.  Biopsy invasive adenocarcinoma; preserved expression major MMR proteins Upper endoscopy 12/18/2019-normal esophagus, stomach, examined duodenum CEA 12/19/2019-8,923 Biopsy liver lesion 12/23/2019-adenocarcinoma Foundation 1-K-ras wild-type; tumor mutation burden greater than or equal to 10; microsatellite stable; BRAF V600E Cycle 1 FOLFOX 01/01/2020 Cycle 2 FOLFOX 01/15/2020  Cycle 3 FOLFOX 01/29/2020, Udenyca added Cycle 4 FOLFOX 02/12/2020, Udenyca held Cycle 5 FOLFOX 02/26/2020, Udenyca Cycle 6 FOLFOX 03/11/2020, Udenyca held CT abdomen/pelvis 03/16/2020-stable diffuse liver lesions, stable strictured appearing ascending colon, possible pericolonic implant, vague left lower lobe nodule Cycle 1 FOLFIRI/Avastin 04/06/2020 Cycle 2 FOLFIRI/Avastin 04/21/2020 Cycle 3 FOLFIRI/Avastin 05/06/2020 Cycle 4 FOLFIRI/Avastin 05/20/2020 Cycle 5 FOLFIRI/Avastin 06/03/2020 CTs 06/15/2020-mild to moderate improvement in hepatic metastasis.  No new or progressive disease.  Narrowing within the proximal ascending colon with upstream mild cecal and terminal ileum dilatation, similar. Cycle 6 FOLFIRI/Avastin 06/17/2020 Cycle 7 FOLFIRI/Avastin 07/01/2020 Cycle 8 FOLFIRI/Avastin 07/15/2020 Cycle 9 FOLFIRI/Avastin 07/29/2020-5-FU bolus eliminated, 5-FU infusion and irinotecan dose reduced Cycle 10 FOLFIRI/Avastin 08/12/2020 CTs 08/23/2020- mild residual wall thickening involving the cecum/ascending colon.  Numerous liver metastases improved. Cycle 11 FOLFIRI/Avastin 08/25/2020 Cycle 12 FOLFIRI/Avastin 09/16/2020 Cycle 13 FOLFIRI/Avastin 10/07/2020 Cycle 14 FOLFIRI/Avastin 10/28/2020 Cycle 15 FOLFIRI/Avastin 11/18/2020 CT abdomen/pelvis 12/06/2020-mild improvement in multifocal hepatic metastases, wall thickening at the ascending colon with pericolonic inflammatory changes Cycle 16 FOLFIRI/Avastin 12/09/2020 Cycle 17 FOLFIRI/Avastin 12/30/2020 Cycle 18 FOLFIRI/Avastin 01/20/2021 Cycle 19 FOLFIRI/Avastin 02/10/2021 Cycle 20  FOLFIRI/Avastin 03/03/2021 Cycle 21 FOLFIRI/Avastin 03/24/2021 Cycle 22 FOLFIRI/Avastin  04/14/2021 CTs 05/04/2021-slight improvement in hepatic metastatic disease. Cycle 23 FOLFIRI/Avastin 05/05/2021 Cycle 24 FOLFIRI/Avastin 05/25/2021 Cycle 25 FOLFIRI/Avastin 06/15/2021 Cycle 26 FOLFIRI/Avastin 07/13/2021 Cycle 27 FOLFIRI/Avastin 08/02/2021 CTs 08/30/2021-majority of liver lesions are unchanged in size, 1 lesion adjacent to the gallbladder has increased, no adenopathy, persistent pericolonic stranding and peritoneal thickening at the cecum Progressive rise in CEA 08/31/2021-treatment changed to Xeloda/bevacizumab, began [place] 09/22/2021 CEA stable 11/03/2021 Xeloda/bevacizumab continued CTs 01/10/2022-increase in size of some of the liver lesions, others are stable, new tiny right lower lobe nodule, increased dilation of distal small bowel with increased wall thickening at the terminal ileum and ileocecal valve with 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, stable bilateral paracolic gutter peritoneal thickening Panitumumab/ Encorafenib 01/12/2022 (encorafenib 01/19/2022) Panitumumab 02/02/2022 Panitumumab 02/20/2022 Panitumumab 03/06/2022 Panitumumab 03/20/2022 Panitumumab 04/03/2022 CTs 04/14/2022-decrease size of liver metastases, new borderline subcarinal adenopathy, improved peritoneal thickening, cirrhosis or pseudocirrhosis, persistent dilated cecum Encorafenib and Panitumumab continued Panitumumab 04/18/2022 Panitumumab 05/05/2022 Panitumumab 05/19/2022 Panitumumab 06/02/2022 Panitumumab 06/16/2022, 4g IV mag and begin oral mag po once daily  Panitumumab 06/30/2022 Panitumumab 07/14/2022, 2 g IV magnesium, increase oral magnesium to 400 mg twice daily Panitumumab 07/28/2022, 2 g IV magnesium Anemia secondary to GI blood loss, on oral iron BID  Cirrhosis 12/26/2019-hospital admission for right lower extremity DVT and GI bleed, treated with heparin anticoagulation beginning 12/26/2019, converted to  Lovenox at discharge 12/29/2019 Lovenox reduced to 40 mg daily 02/10/2021 secondary to bruising and nosebleeding History of low-grade fever-likely "tumor" fever COVID-19 infection January 2021      Disposition: [name] appears stable.  She is tolerating the encorafenib and panitumumab well.  She will complete another cycle of panitumumab today.  She will receive IV magnesium supplementation today.  [name] will undergo a restaging CT later today.  She will return for an office visit in 2 weeks.  [name], MD  07/28/2022  8:52 AM

## 2022-07-28 NOTE — Progress Notes (Signed)
Patient seen by Dr. Benay Spice today  Vitals are within treatment parameters.  Labs reviewed by Dr. Benay Spice and are not all within treatment parameters. Mg+ 1.5  Per physician team, patient is ready for treatment. Please note that modifications are being made to the treatment plan including   Will need 2 grams IV Mg+ today

## 2022-07-31 ENCOUNTER — Telehealth: Payer: Self-pay | Admitting: *Deleted

## 2022-07-31 DIAGNOSIS — H43813 Vitreous degeneration, bilateral: Secondary | ICD-10-CM | POA: Diagnosis not present

## 2022-07-31 NOTE — Telephone Encounter (Signed)
Stacy Burns made aware of stable CT report. Continue current treatment and f/u as scheduled.

## 2022-07-31 NOTE — Telephone Encounter (Signed)
-----   Message from Ladell Pier, MD sent at 07/31/2022  6:38 AM EST ----- Please call patient, CTs show stable liver lesion and colon mass, no evidence for progressive cancer, continue current treatment, f/u as scheduled

## 2022-08-07 ENCOUNTER — Other Ambulatory Visit: Payer: Self-pay | Admitting: Oncology

## 2022-08-07 DIAGNOSIS — C182 Malignant neoplasm of ascending colon: Secondary | ICD-10-CM

## 2022-08-13 ENCOUNTER — Other Ambulatory Visit: Payer: Self-pay | Admitting: Oncology

## 2022-08-14 ENCOUNTER — Other Ambulatory Visit: Payer: Self-pay

## 2022-08-18 ENCOUNTER — Inpatient Hospital Stay: Payer: BC Managed Care – PPO

## 2022-08-18 ENCOUNTER — Encounter: Payer: Self-pay | Admitting: Oncology

## 2022-08-18 ENCOUNTER — Other Ambulatory Visit: Payer: Self-pay

## 2022-08-18 ENCOUNTER — Inpatient Hospital Stay (HOSPITAL_BASED_OUTPATIENT_CLINIC_OR_DEPARTMENT_OTHER): Payer: BC Managed Care – PPO | Admitting: Oncology

## 2022-08-18 ENCOUNTER — Encounter: Payer: Self-pay | Admitting: *Deleted

## 2022-08-18 ENCOUNTER — Inpatient Hospital Stay: Payer: BC Managed Care – PPO | Attending: Oncology

## 2022-08-18 VITALS — BP 150/70 | HR 87 | Temp 98.2°F | Resp 18 | Ht 67.0 in | Wt 143.0 lb

## 2022-08-18 DIAGNOSIS — C787 Secondary malignant neoplasm of liver and intrahepatic bile duct: Secondary | ICD-10-CM | POA: Insufficient documentation

## 2022-08-18 DIAGNOSIS — Z79899 Other long term (current) drug therapy: Secondary | ICD-10-CM | POA: Diagnosis not present

## 2022-08-18 DIAGNOSIS — D500 Iron deficiency anemia secondary to blood loss (chronic): Secondary | ICD-10-CM | POA: Diagnosis not present

## 2022-08-18 DIAGNOSIS — Z86718 Personal history of other venous thrombosis and embolism: Secondary | ICD-10-CM | POA: Insufficient documentation

## 2022-08-18 DIAGNOSIS — C182 Malignant neoplasm of ascending colon: Secondary | ICD-10-CM | POA: Diagnosis not present

## 2022-08-18 DIAGNOSIS — Z7901 Long term (current) use of anticoagulants: Secondary | ICD-10-CM | POA: Diagnosis not present

## 2022-08-18 DIAGNOSIS — Z5112 Encounter for antineoplastic immunotherapy: Secondary | ICD-10-CM | POA: Diagnosis not present

## 2022-08-18 DIAGNOSIS — K7460 Unspecified cirrhosis of liver: Secondary | ICD-10-CM | POA: Diagnosis not present

## 2022-08-18 LAB — CBC WITH DIFFERENTIAL (CANCER CENTER ONLY)
Abs Immature Granulocytes: 0.02 10*3/uL (ref 0.00–0.07)
Basophils Absolute: 0 10*3/uL (ref 0.0–0.1)
Basophils Relative: 1 %
Eosinophils Absolute: 0.2 10*3/uL (ref 0.0–0.5)
Eosinophils Relative: 3 %
HCT: 29.4 % — ABNORMAL LOW (ref 36.0–46.0)
Hemoglobin: 9.4 g/dL — ABNORMAL LOW (ref 12.0–15.0)
Immature Granulocytes: 0 %
Lymphocytes Relative: 12 %
Lymphs Abs: 0.7 10*3/uL (ref 0.7–4.0)
MCH: 28.7 pg (ref 26.0–34.0)
MCHC: 32 g/dL (ref 30.0–36.0)
MCV: 89.6 fL (ref 80.0–100.0)
Monocytes Absolute: 0.6 10*3/uL (ref 0.1–1.0)
Monocytes Relative: 10 %
Neutro Abs: 4.5 10*3/uL (ref 1.7–7.7)
Neutrophils Relative %: 74 %
Platelet Count: 157 10*3/uL (ref 150–400)
RBC: 3.28 MIL/uL — ABNORMAL LOW (ref 3.87–5.11)
RDW: 15 % (ref 11.5–15.5)
WBC Count: 6 10*3/uL (ref 4.0–10.5)
nRBC: 0 % (ref 0.0–0.2)

## 2022-08-18 LAB — CMP (CANCER CENTER ONLY)
ALT: 10 U/L (ref 0–44)
AST: 16 U/L (ref 15–41)
Albumin: 3.4 g/dL — ABNORMAL LOW (ref 3.5–5.0)
Alkaline Phosphatase: 113 U/L (ref 38–126)
Anion gap: 10 (ref 5–15)
BUN: 21 mg/dL (ref 8–23)
CO2: 23 mmol/L (ref 22–32)
Calcium: 8.7 mg/dL — ABNORMAL LOW (ref 8.9–10.3)
Chloride: 107 mmol/L (ref 98–111)
Creatinine: 0.63 mg/dL (ref 0.44–1.00)
GFR, Estimated: >60 mL/min (ref >60)
Glucose, Bld: 143 mg/dL — ABNORMAL HIGH (ref 70–99)
Potassium: 4 mmol/L (ref 3.5–5.1)
Sodium: 140 mmol/L (ref 135–145)
Total Bilirubin: 0.3 mg/dL (ref 0.3–1.2)
Total Protein: 6.1 g/dL — ABNORMAL LOW (ref 6.5–8.1)

## 2022-08-18 LAB — CEA (ACCESS): CEA (CHCC): 16.73 ng/mL — ABNORMAL HIGH (ref 0.00–5.00)

## 2022-08-18 LAB — MAGNESIUM: Magnesium: 1.4 mg/dL — ABNORMAL LOW (ref 1.7–2.4)

## 2022-08-18 MED ORDER — SODIUM CHLORIDE 0.9% FLUSH
10.0000 mL | INTRAVENOUS | Status: DC | PRN
  Administered 2022-08-18: 10 mL

## 2022-08-18 MED ORDER — OXYCODONE-ACETAMINOPHEN 5-325 MG PO TABS: 1.0000 | ORAL_TABLET | Freq: Three times a day (TID) | ORAL | 0 refills | Status: DC | PRN

## 2022-08-18 MED ORDER — MAGNESIUM SULFATE 4 GM/100ML IV SOLN
4.0000 g | Freq: Once | INTRAVENOUS | Status: AC
  Administered 2022-08-18: 4 g via INTRAVENOUS
  Filled 2022-08-18: qty 100

## 2022-08-18 MED ORDER — SODIUM CHLORIDE 0.9 % IV SOLN: Freq: Once | INTRAVENOUS | Status: AC

## 2022-08-18 MED ORDER — SODIUM CHLORIDE 0.9 % IV SOLN
6.0000 mg/kg | Freq: Once | INTRAVENOUS | Status: AC
  Administered 2022-08-18: 400 mg via INTRAVENOUS
  Filled 2022-08-18: qty 20

## 2022-08-18 MED ORDER — HEPARIN SOD (PORK) LOCK FLUSH 100 UNIT/ML IV SOLN
500.0000 [IU] | Freq: Once | INTRAVENOUS | Status: AC | PRN
  Administered 2022-08-18: 500 [IU]

## 2022-08-18 NOTE — Progress Notes (Signed)
Patient seen by Dr. Benay Spice today  Vitals are within treatment parameters.No intervention needed for BP 150/70  Labs reviewed by Dr. Benay Spice and are not all within treatment parameters. Mg+ 1.4--proceed  Per physician team, patient is ready for treatment. Please note that modifications are being made to the treatment plan including Mg+ 4 grams IV today

## 2022-08-18 NOTE — Progress Notes (Signed)
[facility] OFFICE PROGRESS NOTE   Diagnosis: Colon cancer  INTERVAL HISTORY:   Stacy Burns returns as scheduled.  She continues treatment with encorafenib and panitumumab.  No rash.  No bleeding aside from nosebleeding.  She is working.  She has mild discomfort in the right abdomen.  Objective:  Vital signs in last 24 hours:  Blood pressure (!) 150/70, pulse 87, temperature 98.2 F (36.8 C), temperature source Oral, resp. rate 18, height '5\' 7"'$  (1.702 m), weight 143 lb (64.9 kg), SpO2 100 %.    HEENT: No thrush or ulcers Resp: Lungs clear bilaterally Cardio: Regular rate and rhythm GI: No hepatosplenomegaly, no mass, mild tenderness in the right lower abdomen Vascular: No leg edema  Skin: Few acne type lesions over the face, ecchymosis at the dorsum of the hands  Portacath/PICC-without erythema  Lab Results:  Lab Results  Component Value Date   WBC 6.0 08/18/2022   HGB 9.4 (L) 08/18/2022   HCT 29.4 (L) 08/18/2022   MCV 89.6 08/18/2022   PLT 157 08/18/2022   NEUTROABS 4.5 08/18/2022    CMP  Lab Results  Component Value Date   NA 139 07/28/2022   K 4.1 07/28/2022   CL 108 07/28/2022   CO2 22 07/28/2022   GLUCOSE 87 07/28/2022   BUN 27 (H) 07/28/2022   CREATININE 0.64 07/28/2022   CALCIUM 9.0 07/28/2022   PROT 6.3 (L) 07/28/2022   ALBUMIN 3.6 07/28/2022   AST 15 07/28/2022   ALT 11 07/28/2022   ALKPHOS 93 07/28/2022   BILITOT 0.3 07/28/2022   GFRNONAA >60 07/28/2022   GFRAA >60 05/06/2020    Lab Results  Component Value Date   CEA1 54.57 (H) 12/30/2020   CEA 10.52 (H) 07/28/2022     Medications: I have reviewed the patient's current medications.   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  Cirrhosis.  Innumerable liver lesions. Colonoscopy 12/18/2019-ulcerated partially obstructing large mass in the mid ascending  colon.  Biopsy invasive adenocarcinoma; preserved expression major MMR proteins Upper endoscopy 12/18/2019-normal esophagus, stomach, examined duodenum CEA 12/19/2019-8,923 Biopsy liver lesion 12/23/2019-adenocarcinoma Foundation 1-K-ras wild-type; tumor mutation burden greater than or equal to 10; microsatellite stable; BRAF V600E Cycle 1 FOLFOX 01/01/2020 Cycle 2 FOLFOX 01/15/2020  Cycle 3 FOLFOX 01/29/2020, Udenyca added Cycle 4 FOLFOX 02/12/2020, Udenyca held Cycle 5 FOLFOX 02/26/2020, Udenyca Cycle 6 FOLFOX 03/11/2020, Udenyca held CT abdomen/pelvis 03/16/2020-stable diffuse liver lesions, stable strictured appearing ascending colon, possible pericolonic implant, vague left lower lobe nodule Cycle 1 FOLFIRI/Avastin 04/06/2020 Cycle 2 FOLFIRI/Avastin 04/21/2020 Cycle 3 FOLFIRI/Avastin 05/06/2020 Cycle 4 FOLFIRI/Avastin 05/20/2020 Cycle 5 FOLFIRI/Avastin 06/03/2020 CTs 06/15/2020-mild to moderate improvement in hepatic metastasis.  No new or progressive disease.  Narrowing within the proximal ascending colon with upstream mild cecal and terminal ileum dilatation, similar. Cycle 6 FOLFIRI/Avastin 06/17/2020 Cycle 7 FOLFIRI/Avastin 07/01/2020 Cycle 8 FOLFIRI/Avastin 07/15/2020 Cycle 9 FOLFIRI/Avastin 07/29/2020-5-FU bolus eliminated, 5-FU infusion and irinotecan dose reduced Cycle 10 FOLFIRI/Avastin 08/12/2020 CTs 08/23/2020- mild residual wall thickening involving the cecum/ascending colon.  Numerous liver metastases improved. Cycle 11 FOLFIRI/Avastin 08/25/2020 Cycle 12 FOLFIRI/Avastin 09/16/2020 Cycle 13 FOLFIRI/Avastin 10/07/2020 Cycle 14 FOLFIRI/Avastin 10/28/2020 Cycle 15 FOLFIRI/Avastin 11/18/2020 CT abdomen/pelvis 12/06/2020-mild improvement in multifocal hepatic metastases, wall thickening at the ascending colon with pericolonic inflammatory changes Cycle 16 FOLFIRI/Avastin 12/09/2020 Cycle 17 FOLFIRI/Avastin 12/30/2020 Cycle 18 FOLFIRI/Avastin 01/20/2021 Cycle 19 FOLFIRI/Avastin 02/10/2021 Cycle 20  FOLFIRI/Avastin 03/03/2021 Cycle 21 FOLFIRI/Avastin 03/24/2021 Cycle 22  FOLFIRI/Avastin 04/14/2021 CTs 05/04/2021-slight improvement in hepatic metastatic disease. Cycle 23 FOLFIRI/Avastin 05/05/2021 Cycle 24 FOLFIRI/Avastin 05/25/2021 Cycle 25 FOLFIRI/Avastin 06/15/2021 Cycle 26 FOLFIRI/Avastin 07/13/2021 Cycle 27 FOLFIRI/Avastin 08/02/2021 CTs 08/30/2021-majority of liver lesions are unchanged in size, 1 lesion adjacent to the gallbladder has increased, no adenopathy, persistent pericolonic stranding and peritoneal thickening at the cecum Progressive rise in CEA 08/31/2021-treatment changed to Xeloda/bevacizumab, began [place] 09/22/2021 CEA stable 11/03/2021 Xeloda/bevacizumab continued CTs 01/10/2022-increase in size of some of the liver lesions, others are stable, new tiny right lower lobe nodule, increased dilation of distal small bowel with increased wall thickening at the terminal ileum and ileocecal valve with 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, stable bilateral paracolic gutter peritoneal thickening Panitumumab/ Encorafenib 01/12/2022 (encorafenib 01/19/2022) Panitumumab 02/02/2022 Panitumumab 02/20/2022 Panitumumab 03/06/2022 Panitumumab 03/20/2022 Panitumumab 04/03/2022 CTs 04/14/2022-decrease size of liver metastases, new borderline subcarinal adenopathy, improved peritoneal thickening, cirrhosis or pseudocirrhosis, persistent dilated cecum Encorafenib and Panitumumab continued Panitumumab 04/18/2022 Panitumumab 05/05/2022 Panitumumab 05/19/2022 Panitumumab 06/02/2022 Panitumumab 06/16/2022, 4g IV mag and begin oral mag po once daily  Panitumumab 06/30/2022 Panitumumab 07/14/2022, 2 g IV magnesium, increase oral magnesium to 400 mg twice daily Panitumumab 07/28/2022, 2 g IV magnesium CTs 07/28/2022-no change in hepatic metastases, no new signs of metastatic disease, persistent mural thickening at the terminal ileum/cecum mild splenomegaly unchanged Panitumumab 08/18/2022 Anemia secondary  to GI blood loss, on oral iron BID  Cirrhosis 12/26/2019-hospital admission for right lower extremity DVT and GI bleed, treated with heparin anticoagulation beginning 12/26/2019, converted to Lovenox at discharge 12/29/2019 Lovenox reduced to 40 mg daily 02/10/2021 secondary to bruising and nosebleeding History of low-grade fever-likely "tumor" fever COVID-19 infection January 2021        Disposition: Ms. Rachels appears stable.  The restaging CTs last month revealed stable disease.  She is tolerating the panitumumab and encorafenib well.  She will complete another treatment with panitumumab today.  She will return for an office visit and treatment in 2 weeks.  [name], MD  08/18/2022  8:38 AM

## 2022-08-18 NOTE — Patient Instructions (Signed)

## 2022-08-18 NOTE — Patient Instructions (Signed)
[facility]   Discharge Instructions: Thank you for choosing [facility] to provide your oncology and hematology care.   If you have a lab appointment with the [facility], please go directly to the [facility] and check in at the registration area.   Wear comfortable clothing and clothing appropriate for easy access to any Portacath or PICC line.   We strive to give you quality time with your provider. You may need to reschedule your appointment if you arrive late (15 or more minutes).  Arriving late affects you and other patients whose appointments are after yours.  Also, if you miss three or more appointments without notifying the office, you may be dismissed from the clinic at the provider's discretion.      For prescription refill requests, have your pharmacy contact our office and allow 72 hours for refills to be completed.    Today you received the following chemotherapy and/or immunotherapy agents Panitumumab (VECTIBIX).      To help prevent nausea and vomiting after your treatment, we encourage you to take your nausea medication as directed.  BELOW ARE SYMPTOMS THAT SHOULD BE REPORTED IMMEDIATELY: *FEVER GREATER THAN 100.4 F (38 C) OR HIGHER *CHILLS OR SWEATING *NAUSEA AND VOMITING THAT IS NOT CONTROLLED WITH YOUR NAUSEA MEDICATION *UNUSUAL SHORTNESS OF BREATH *UNUSUAL BRUISING OR BLEEDING *URINARY PROBLEMS (pain or burning when urinating, or frequent urination) *BOWEL PROBLEMS (unusual diarrhea, constipation, pain near the anus) TENDERNESS IN MOUTH AND THROAT WITH OR WITHOUT PRESENCE OF ULCERS (sore throat, sores in mouth, or a toothache) UNUSUAL RASH, SWELLING OR PAIN  UNUSUAL VAGINAL DISCHARGE OR ITCHING   Items with * indicate a potential emergency and should be followed up as soon as possible or go to the Emergency Department if any problems should occur.  Please show the CHEMOTHERAPY ALERT CARD or IMMUNOTHERAPY ALERT CARD at  check-in to the Emergency Department and triage nurse.  Should you have questions after your visit or need to cancel or reschedule your appointment, please contact [facility]  Dept: [phone]  and follow the prompts.  Office hours are 8:00 a.m. to 4:30 p.m. Monday - Friday. Please note that voicemails left after 4:00 p.m. may not be returned until the following business day.  We are closed weekends and major holidays. You have access to a nurse at all times for urgent questions. Please call the main number to the clinic Dept: [phone] and follow the prompts.   For any non-urgent questions, you may also contact your provider using MyChart. We now offer e-Visits for anyone 62 and older to request care online for non-urgent symptoms. For details visit [email or web address].   Also download the MyChart app! Go to the app store, search "MyChart", open the app, select [facility], and log in with your MyChart username and password.  Magnesium Sulfate Injection What is this medication? MAGNESIUM SULFATE (mag NEE zee um SUL fate) prevents and treats low levels of magnesium in your body. It may also be used to prevent and treat seizures during pregnancy in people with high blood pressure disorders, such as preeclampsia or eclampsia. Magnesium plays an important role in maintaining the health of your muscles and nervous system. This medicine may be used for other purposes; ask your health care provider or pharmacist if you have questions. What should I tell my care team before I take this medication? They need to know if you have any of these conditions: Heart disease  History of irregular heart beat Kidney disease An unusual or allergic reaction to magnesium sulfate, medications, foods, dyes, or preservatives Pregnant or trying to get pregnant Breast-feeding How should I use this medication? This medication is for infusion into a vein. It is given in a hospital or  clinic setting. Talk to your care team about the use of this medication in children. While this medication may be prescribed for selected conditions, precautions do apply. Overdosage: If you think you have taken too much of this medicine contact a poison control center or emergency room at once. NOTE: This medicine is only for you. Do not share this medicine with others. What if I miss a dose? This does not apply. What may interact with this medication? Certain medications for anxiety or sleep Certain medications for seizures, such phenobarbital Digoxin Medications that relax muscles for surgery Narcotic medications for pain This list may not describe all possible interactions. Give your health care provider a list of all the medicines, herbs, non-prescription drugs, or dietary supplements you use. Also tell them if you smoke, drink alcohol, or use illegal drugs. Some items may interact with your medicine. What should I watch for while using this medication? Your condition will be monitored carefully while you are receiving this medication. You may need blood work done while you are receiving this medication. What side effects may I notice from receiving this medication? Side effects that you should report to your care team as soon as possible: Allergic reactions--skin rash, itching, hives, swelling of the face, lips, tongue, or throat High magnesium level--confusion, drowsiness, facial flushing, redness, sweating, muscle weakness, fast or irregular heartbeat, trouble breathing Low blood pressure--dizziness, feeling faint or lightheaded, blurry vision Side effects that usually do not require medical attention (report to your care team if they continue or are bothersome): Headache Nausea This list may not describe all possible side effects. Call your doctor for medical advice about side effects. You may report side effects to FDA at 1-800-FDA-1088. Where should I keep my medication? This  medication is given in a hospital or clinic and will not be stored at home. NOTE: This sheet is a summary. It may not cover all possible information. If you have questions about this medicine, talk to your doctor, pharmacist, or health care provider.  2023 Elsevier/Gold Standard (2012-12-06 00:00:00)

## 2022-08-20 ENCOUNTER — Other Ambulatory Visit: Payer: Self-pay

## 2022-08-24 ENCOUNTER — Other Ambulatory Visit: Payer: Self-pay | Admitting: Nurse Practitioner

## 2022-08-24 DIAGNOSIS — C182 Malignant neoplasm of ascending colon: Secondary | ICD-10-CM

## 2022-08-26 ENCOUNTER — Other Ambulatory Visit: Payer: Self-pay | Admitting: Oncology

## 2022-08-29 ENCOUNTER — Other Ambulatory Visit: Payer: Self-pay

## 2022-09-01 ENCOUNTER — Encounter: Payer: Self-pay | Admitting: Oncology

## 2022-09-01 ENCOUNTER — Inpatient Hospital Stay: Payer: BC Managed Care – PPO

## 2022-09-01 ENCOUNTER — Encounter: Payer: Self-pay | Admitting: *Deleted

## 2022-09-01 ENCOUNTER — Inpatient Hospital Stay: Payer: BC Managed Care – PPO | Admitting: Oncology

## 2022-09-01 VITALS — BP 130/58 | HR 71 | Temp 98.7°F | Resp 20 | Ht 67.0 in | Wt 143.0 lb

## 2022-09-01 DIAGNOSIS — Z7901 Long term (current) use of anticoagulants: Secondary | ICD-10-CM | POA: Diagnosis not present

## 2022-09-01 DIAGNOSIS — Z86718 Personal history of other venous thrombosis and embolism: Secondary | ICD-10-CM | POA: Diagnosis not present

## 2022-09-01 DIAGNOSIS — K7460 Unspecified cirrhosis of liver: Secondary | ICD-10-CM | POA: Diagnosis not present

## 2022-09-01 DIAGNOSIS — C182 Malignant neoplasm of ascending colon: Secondary | ICD-10-CM

## 2022-09-01 DIAGNOSIS — D500 Iron deficiency anemia secondary to blood loss (chronic): Secondary | ICD-10-CM | POA: Diagnosis not present

## 2022-09-01 DIAGNOSIS — Z79899 Other long term (current) drug therapy: Secondary | ICD-10-CM | POA: Diagnosis not present

## 2022-09-01 DIAGNOSIS — Z5112 Encounter for antineoplastic immunotherapy: Secondary | ICD-10-CM | POA: Diagnosis not present

## 2022-09-01 DIAGNOSIS — C787 Secondary malignant neoplasm of liver and intrahepatic bile duct: Secondary | ICD-10-CM | POA: Diagnosis not present

## 2022-09-01 LAB — CBC WITH DIFFERENTIAL (CANCER CENTER ONLY)
Abs Immature Granulocytes: 0.01 10*3/uL (ref 0.00–0.07)
Basophils Absolute: 0 10*3/uL (ref 0.0–0.1)
Basophils Relative: 0 %
Eosinophils Absolute: 0.1 10*3/uL (ref 0.0–0.5)
Eosinophils Relative: 2 %
HCT: 28.5 % — ABNORMAL LOW (ref 36.0–46.0)
Hemoglobin: 8.9 g/dL — ABNORMAL LOW (ref 12.0–15.0)
Immature Granulocytes: 0 %
Lymphocytes Relative: 12 %
Lymphs Abs: 0.7 10*3/uL (ref 0.7–4.0)
MCH: 27.9 pg (ref 26.0–34.0)
MCHC: 31.2 g/dL (ref 30.0–36.0)
MCV: 89.3 fL (ref 80.0–100.0)
Monocytes Absolute: 0.7 10*3/uL (ref 0.1–1.0)
Monocytes Relative: 13 %
Neutro Abs: 3.8 10*3/uL (ref 1.7–7.7)
Neutrophils Relative %: 73 %
Platelet Count: 171 10*3/uL (ref 150–400)
RBC: 3.19 MIL/uL — ABNORMAL LOW (ref 3.87–5.11)
RDW: 15.1 % (ref 11.5–15.5)
WBC Count: 5.3 10*3/uL (ref 4.0–10.5)
nRBC: 0 % (ref 0.0–0.2)

## 2022-09-01 LAB — CMP (CANCER CENTER ONLY)
ALT: 8 U/L (ref 0–44)
AST: 11 U/L — ABNORMAL LOW (ref 15–41)
Albumin: 3.5 g/dL (ref 3.5–5.0)
Alkaline Phosphatase: 105 U/L (ref 38–126)
Anion gap: 5 (ref 5–15)
BUN: 28 mg/dL — ABNORMAL HIGH (ref 8–23)
CO2: 22 mmol/L (ref 22–32)
Calcium: 9 mg/dL (ref 8.9–10.3)
Chloride: 110 mmol/L (ref 98–111)
Creatinine: 0.71 mg/dL (ref 0.44–1.00)
GFR, Estimated: >60 mL/min (ref >60)
Glucose, Bld: 79 mg/dL (ref 70–99)
Potassium: 4.5 mmol/L (ref 3.5–5.1)
Sodium: 137 mmol/L (ref 135–145)
Total Bilirubin: 0.3 mg/dL (ref 0.3–1.2)
Total Protein: 6.1 g/dL — ABNORMAL LOW (ref 6.5–8.1)

## 2022-09-01 LAB — CEA (ACCESS): CEA (CHCC): 10.97 ng/mL — ABNORMAL HIGH (ref 0.00–5.00)

## 2022-09-01 LAB — MAGNESIUM: Magnesium: 1.5 mg/dL — ABNORMAL LOW (ref 1.7–2.4)

## 2022-09-01 MED ORDER — SODIUM CHLORIDE 0.9% FLUSH
10.0000 mL | INTRAVENOUS | Status: DC | PRN
  Administered 2022-09-01: 10 mL

## 2022-09-01 MED ORDER — SODIUM CHLORIDE 0.9 % IV SOLN: Freq: Once | INTRAVENOUS | Status: AC

## 2022-09-01 MED ORDER — HEPARIN SOD (PORK) LOCK FLUSH 100 UNIT/ML IV SOLN
500.0000 [IU] | Freq: Once | INTRAVENOUS | Status: AC | PRN
  Administered 2022-09-01: 500 [IU]

## 2022-09-01 MED ORDER — MAGNESIUM SULFATE 2 GM/50ML IV SOLN
2.0000 g | Freq: Once | INTRAVENOUS | Status: AC
  Administered 2022-09-01: 2 g via INTRAVENOUS
  Filled 2022-09-01: qty 50

## 2022-09-01 MED ORDER — SODIUM CHLORIDE 0.9 % IV SOLN
6.0000 mg/kg | Freq: Once | INTRAVENOUS | Status: AC
  Administered 2022-09-01: 400 mg via INTRAVENOUS
  Filled 2022-09-01: qty 20

## 2022-09-01 NOTE — Progress Notes (Signed)
Patient seen by Dr. Benay Spice today  Vitals are within treatment parameters.  Labs reviewed by Dr. Benay Spice and are not all within treatment parameters. Mg+ 1.5  Per physician team, patient is ready for treatment. Please note that modifications are being made to the treatment plan including . Will receive 2 grams IV Mg+

## 2022-09-01 NOTE — Progress Notes (Signed)
[facility] OFFICE PROGRESS NOTE   Diagnosis: Colon cancer  INTERVAL HISTORY:   Stacy Burns returns as scheduled.  She continues encorafenib.  She was last treated with panitumumab on 08/18/2022.  No diarrhea.  She reports mild daily nosebleeding.  She is working.  Good appetite.  Objective:  Vital signs in last 24 hours:  Blood pressure (!) 130/58, pulse 71, temperature 98.7 F (37.1 C), temperature source Oral, resp. rate 20, height '5\' 7"'$  (1.702 m), weight 143 lb (64.9 kg), SpO2 99 %.    HEENT: Erythema and thickening at the nostril bilaterally Resp: Lungs clear bilaterally Cardio: Regular rate and rhythm GI: No mass, no hepatosplenomegaly, nontender Vascular: No leg edema  Skin: Few acne type lesions over the face  Portacath/PICC-without erythema  Lab Results:  Lab Results  Component Value Date   WBC 5.3 09/01/2022   HGB 8.9 (L) 09/01/2022   HCT 28.5 (L) 09/01/2022   MCV 89.3 09/01/2022   PLT 171 09/01/2022   NEUTROABS 3.8 09/01/2022    CMP  Lab Results  Component Value Date   NA 140 08/18/2022   K 4.0 08/18/2022   CL 107 08/18/2022   CO2 23 08/18/2022   GLUCOSE 143 (H) 08/18/2022   BUN 21 08/18/2022   CREATININE 0.63 08/18/2022   CALCIUM 8.7 (L) 08/18/2022   PROT 6.1 (L) 08/18/2022   ALBUMIN 3.4 (L) 08/18/2022   AST 16 08/18/2022   ALT 10 08/18/2022   ALKPHOS 113 08/18/2022   BILITOT 0.3 08/18/2022   GFRNONAA >60 08/18/2022   GFRAA >60 05/06/2020    Lab Results  Component Value Date   CEA1 54.57 (H) 12/30/2020   CEA 16.73 (H) 08/18/2022    Medications: I have reviewed the patient's current medications.   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  Cirrhosis.  Innumerable liver lesions. Colonoscopy 12/18/2019-ulcerated partially obstructing large mass in the mid ascending colon.  Biopsy invasive adenocarcinoma;  preserved expression major MMR proteins Upper endoscopy 12/18/2019-normal esophagus, stomach, examined duodenum CEA 12/19/2019-8,923 Biopsy liver lesion 12/23/2019-adenocarcinoma Foundation 1-K-ras wild-type; tumor mutation burden greater than or equal to 10; microsatellite stable; BRAF V600E Cycle 1 FOLFOX 01/01/2020 Cycle 2 FOLFOX 01/15/2020  Cycle 3 FOLFOX 01/29/2020, Udenyca added Cycle 4 FOLFOX 02/12/2020, Udenyca held Cycle 5 FOLFOX 02/26/2020, Udenyca Cycle 6 FOLFOX 03/11/2020, Udenyca held CT abdomen/pelvis 03/16/2020-stable diffuse liver lesions, stable strictured appearing ascending colon, possible pericolonic implant, vague left lower lobe nodule Cycle 1 FOLFIRI/Avastin 04/06/2020 Cycle 2 FOLFIRI/Avastin 04/21/2020 Cycle 3 FOLFIRI/Avastin 05/06/2020 Cycle 4 FOLFIRI/Avastin 05/20/2020 Cycle 5 FOLFIRI/Avastin 06/03/2020 CTs 06/15/2020-mild to moderate improvement in hepatic metastasis.  No new or progressive disease.  Narrowing within the proximal ascending colon with upstream mild cecal and terminal ileum dilatation, similar. Cycle 6 FOLFIRI/Avastin 06/17/2020 Cycle 7 FOLFIRI/Avastin 07/01/2020 Cycle 8 FOLFIRI/Avastin 07/15/2020 Cycle 9 FOLFIRI/Avastin 07/29/2020-5-FU bolus eliminated, 5-FU infusion and irinotecan dose reduced Cycle 10 FOLFIRI/Avastin 08/12/2020 CTs 08/23/2020- mild residual wall thickening involving the cecum/ascending colon.  Numerous liver metastases improved. Cycle 11 FOLFIRI/Avastin 08/25/2020 Cycle 12 FOLFIRI/Avastin 09/16/2020 Cycle 13 FOLFIRI/Avastin 10/07/2020 Cycle 14 FOLFIRI/Avastin 10/28/2020 Cycle 15 FOLFIRI/Avastin 11/18/2020 CT abdomen/pelvis 12/06/2020-mild improvement in multifocal hepatic metastases, wall thickening at the ascending colon with pericolonic inflammatory changes Cycle 16 FOLFIRI/Avastin 12/09/2020 Cycle 17 FOLFIRI/Avastin 12/30/2020 Cycle 18 FOLFIRI/Avastin 01/20/2021 Cycle 19 FOLFIRI/Avastin 02/10/2021 Cycle 20 FOLFIRI/Avastin 03/03/2021 Cycle 21  FOLFIRI/Avastin 03/24/2021 Cycle 22 FOLFIRI/Avastin 04/14/2021 CTs 05/04/2021-slight improvement in hepatic metastatic disease. Cycle  23 FOLFIRI/Avastin 05/05/2021 Cycle 24 FOLFIRI/Avastin 05/25/2021 Cycle 25 FOLFIRI/Avastin 06/15/2021 Cycle 26 FOLFIRI/Avastin 07/13/2021 Cycle 27 FOLFIRI/Avastin 08/02/2021 CTs 08/30/2021-majority of liver lesions are unchanged in size, 1 lesion adjacent to the gallbladder has increased, no adenopathy, persistent pericolonic stranding and peritoneal thickening at the cecum Progressive rise in CEA 08/31/2021-treatment changed to Xeloda/bevacizumab, began [place] 09/22/2021 CEA stable 11/03/2021 Xeloda/bevacizumab continued CTs 01/10/2022-increase in size of some of the liver lesions, others are stable, new tiny right lower lobe nodule, increased dilation of distal small bowel with increased wall thickening at the terminal ileum and ileocecal valve with 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, stable bilateral paracolic gutter peritoneal thickening Panitumumab/ Encorafenib 01/12/2022 (encorafenib 01/19/2022) Panitumumab 02/02/2022 Panitumumab 02/20/2022 Panitumumab 03/06/2022 Panitumumab 03/20/2022 Panitumumab 04/03/2022 CTs 04/14/2022-decrease size of liver metastases, new borderline subcarinal adenopathy, improved peritoneal thickening, cirrhosis or pseudocirrhosis, persistent dilated cecum Encorafenib and Panitumumab continued Panitumumab 04/18/2022 Panitumumab 05/05/2022 Panitumumab 05/19/2022 Panitumumab 06/02/2022 Panitumumab 06/16/2022, 4g IV mag and begin oral mag po once daily  Panitumumab 06/30/2022 Panitumumab 07/14/2022, 2 g IV magnesium, increase oral magnesium to 400 mg twice daily Panitumumab 07/28/2022, 2 g IV magnesium CTs 07/28/2022-no change in hepatic metastases, no new signs of metastatic disease, persistent mural thickening at the terminal ileum/cecum mild splenomegaly unchanged Panitumumab 08/18/2022 Panitumumab 09/01/2022 Anemia secondary to GI blood  loss, on oral iron BID  Cirrhosis 12/26/2019-hospital admission for right lower extremity DVT and GI bleed, treated with heparin anticoagulation beginning 12/26/2019, converted to Lovenox at discharge 12/29/2019 Lovenox reduced to 40 mg daily 02/10/2021 secondary to bruising and nosebleeding History of low-grade fever-likely "tumor" fever COVID-19 infection January 2021       Disposition: Stacy Burns appears stable.  She will complete another cycle of panitumumab today.  She continues encorafenib.  The nosebleeding is likely related to panitumumab skin toxicity at the nostrils.  She will try Vaseline ointment.  Stacy Burns will return for an office visit and panitumumab in 2 weeks.  [name], MD  09/01/2022  8:43 AM

## 2022-09-01 NOTE — Patient Instructions (Signed)
[facility]   Discharge Instructions: Thank you for choosing [facility] to provide your oncology and hematology care.   If you have a lab appointment with the [facility], please go directly to the [facility] and check in at the registration area.   Wear comfortable clothing and clothing appropriate for easy access to any Portacath or PICC line.   We strive to give you quality time with your provider. You may need to reschedule your appointment if you arrive late (15 or more minutes).  Arriving late affects you and other patients whose appointments are after yours.  Also, if you miss three or more appointments without notifying the office, you may be dismissed from the clinic at the provider's discretion.      For prescription refill requests, have your pharmacy contact our office and allow 72 hours for refills to be completed.    Today you received the following chemotherapy and/or immunotherapy agents Panitumumab (VECTIBIX).      To help prevent nausea and vomiting after your treatment, we encourage you to take your nausea medication as directed.  BELOW ARE SYMPTOMS THAT SHOULD BE REPORTED IMMEDIATELY: *FEVER GREATER THAN 100.4 F (38 C) OR HIGHER *CHILLS OR SWEATING *NAUSEA AND VOMITING THAT IS NOT CONTROLLED WITH YOUR NAUSEA MEDICATION *UNUSUAL SHORTNESS OF BREATH *UNUSUAL BRUISING OR BLEEDING *URINARY PROBLEMS (pain or burning when urinating, or frequent urination) *BOWEL PROBLEMS (unusual diarrhea, constipation, pain near the anus) TENDERNESS IN MOUTH AND THROAT WITH OR WITHOUT PRESENCE OF ULCERS (sore throat, sores in mouth, or a toothache) UNUSUAL RASH, SWELLING OR PAIN  UNUSUAL VAGINAL DISCHARGE OR ITCHING   Items with * indicate a potential emergency and should be followed up as soon as possible or go to the Emergency Department if any problems should occur.  Please show the CHEMOTHERAPY ALERT CARD or IMMUNOTHERAPY ALERT CARD at  check-in to the Emergency Department and triage nurse.  Should you have questions after your visit or need to cancel or reschedule your appointment, please contact [facility]  Dept: [phone]  and follow the prompts.  Office hours are 8:00 a.m. to 4:30 p.m. Monday - Friday. Please note that voicemails left after 4:00 p.m. may not be returned until the following business day.  We are closed weekends and major holidays. You have access to a nurse at all times for urgent questions. Please call the main number to the clinic Dept: [phone] and follow the prompts.   For any non-urgent questions, you may also contact your provider using MyChart. We now offer e-Visits for anyone 37 and older to request care online for non-urgent symptoms. For details visit [email or web address].   Also download the MyChart app! Go to the app store, search "MyChart", open the app, select [facility], and log in with your MyChart username and password.  Magnesium Sulfate Injection What is this medication? MAGNESIUM SULFATE (mag NEE zee um SUL fate) prevents and treats low levels of magnesium in your body. It may also be used to prevent and treat seizures during pregnancy in people with high blood pressure disorders, such as preeclampsia or eclampsia. Magnesium plays an important role in maintaining the health of your muscles and nervous system. This medicine may be used for other purposes; ask your health care provider or pharmacist if you have questions. What should I tell my care team before I take this medication? They need to know if you have any of these conditions: Heart disease  History of irregular heart beat Kidney disease An unusual or allergic reaction to magnesium sulfate, medications, foods, dyes, or preservatives Pregnant or trying to get pregnant Breast-feeding How should I use this medication? This medication is for infusion into a vein. It is given in a hospital or  clinic setting. Talk to your care team about the use of this medication in children. While this medication may be prescribed for selected conditions, precautions do apply. Overdosage: If you think you have taken too much of this medicine contact a poison control center or emergency room at once. NOTE: This medicine is only for you. Do not share this medicine with others. What if I miss a dose? This does not apply. What may interact with this medication? Certain medications for anxiety or sleep Certain medications for seizures, such phenobarbital Digoxin Medications that relax muscles for surgery Narcotic medications for pain This list may not describe all possible interactions. Give your health care provider a list of all the medicines, herbs, non-prescription drugs, or dietary supplements you use. Also tell them if you smoke, drink alcohol, or use illegal drugs. Some items may interact with your medicine. What should I watch for while using this medication? Your condition will be monitored carefully while you are receiving this medication. You may need blood work done while you are receiving this medication. What side effects may I notice from receiving this medication? Side effects that you should report to your care team as soon as possible: Allergic reactions--skin rash, itching, hives, swelling of the face, lips, tongue, or throat High magnesium level--confusion, drowsiness, facial flushing, redness, sweating, muscle weakness, fast or irregular heartbeat, trouble breathing Low blood pressure--dizziness, feeling faint or lightheaded, blurry vision Side effects that usually do not require medical attention (report to your care team if they continue or are bothersome): Headache Nausea This list may not describe all possible side effects. Call your doctor for medical advice about side effects. You may report side effects to FDA at 1-800-FDA-1088. Where should I keep my medication? This  medication is given in a hospital or clinic and will not be stored at home. NOTE: This sheet is a summary. It may not cover all possible information. If you have questions about this medicine, talk to your doctor, pharmacist, or health care provider.  2023 Elsevier/Gold Standard (2012-12-06 00:00:00)

## 2022-09-03 ENCOUNTER — Other Ambulatory Visit: Payer: Self-pay

## 2022-09-06 ENCOUNTER — Other Ambulatory Visit: Payer: Self-pay

## 2022-09-08 ENCOUNTER — Other Ambulatory Visit: Payer: Self-pay | Admitting: Oncology

## 2022-09-08 DIAGNOSIS — C182 Malignant neoplasm of ascending colon: Secondary | ICD-10-CM

## 2022-09-15 ENCOUNTER — Inpatient Hospital Stay: Payer: BC Managed Care – PPO

## 2022-09-15 ENCOUNTER — Inpatient Hospital Stay: Payer: BC Managed Care – PPO | Attending: Oncology | Admitting: Nurse Practitioner

## 2022-09-15 ENCOUNTER — Encounter: Payer: Self-pay | Admitting: Nurse Practitioner

## 2022-09-15 VITALS — BP 151/64 | HR 69 | Resp 20

## 2022-09-15 VITALS — BP 145/59 | HR 75 | Temp 98.1°F | Resp 18 | Ht 67.0 in | Wt 139.2 lb

## 2022-09-15 DIAGNOSIS — K7460 Unspecified cirrhosis of liver: Secondary | ICD-10-CM | POA: Insufficient documentation

## 2022-09-15 DIAGNOSIS — R21 Rash and other nonspecific skin eruption: Secondary | ICD-10-CM | POA: Insufficient documentation

## 2022-09-15 DIAGNOSIS — D500 Iron deficiency anemia secondary to blood loss (chronic): Secondary | ICD-10-CM | POA: Diagnosis not present

## 2022-09-15 DIAGNOSIS — C787 Secondary malignant neoplasm of liver and intrahepatic bile duct: Secondary | ICD-10-CM | POA: Insufficient documentation

## 2022-09-15 DIAGNOSIS — C182 Malignant neoplasm of ascending colon: Secondary | ICD-10-CM | POA: Diagnosis not present

## 2022-09-15 DIAGNOSIS — Z86718 Personal history of other venous thrombosis and embolism: Secondary | ICD-10-CM | POA: Diagnosis not present

## 2022-09-15 DIAGNOSIS — Z5112 Encounter for antineoplastic immunotherapy: Secondary | ICD-10-CM | POA: Diagnosis not present

## 2022-09-15 LAB — MAGNESIUM: Magnesium: 1.4 mg/dL — ABNORMAL LOW (ref 1.7–2.4)

## 2022-09-15 LAB — CMP (CANCER CENTER ONLY)
ALT: 7 U/L (ref 0–44)
AST: 11 U/L — ABNORMAL LOW (ref 15–41)
Albumin: 3.6 g/dL (ref 3.5–5.0)
Alkaline Phosphatase: 123 U/L (ref 38–126)
Anion gap: 8 (ref 5–15)
BUN: 21 mg/dL (ref 8–23)
CO2: 23 mmol/L (ref 22–32)
Calcium: 9.2 mg/dL (ref 8.9–10.3)
Chloride: 109 mmol/L (ref 98–111)
Creatinine: 0.71 mg/dL (ref 0.44–1.00)
GFR, Estimated: >60 mL/min (ref >60)
Glucose, Bld: 98 mg/dL (ref 70–99)
Potassium: 3.9 mmol/L (ref 3.5–5.1)
Sodium: 140 mmol/L (ref 135–145)
Total Bilirubin: 0.6 mg/dL (ref 0.3–1.2)
Total Protein: 6.8 g/dL (ref 6.5–8.1)

## 2022-09-15 LAB — CBC WITH DIFFERENTIAL (CANCER CENTER ONLY)
Abs Immature Granulocytes: 0.03 10*3/uL (ref 0.00–0.07)
Basophils Absolute: 0 10*3/uL (ref 0.0–0.1)
Basophils Relative: 0 %
Eosinophils Absolute: 0.2 10*3/uL (ref 0.0–0.5)
Eosinophils Relative: 3 %
HCT: 30.4 % — ABNORMAL LOW (ref 36.0–46.0)
Hemoglobin: 9.7 g/dL — ABNORMAL LOW (ref 12.0–15.0)
Immature Granulocytes: 0 %
Lymphocytes Relative: 9 %
Lymphs Abs: 0.6 10*3/uL — ABNORMAL LOW (ref 0.7–4.0)
MCH: 27.6 pg (ref 26.0–34.0)
MCHC: 31.9 g/dL (ref 30.0–36.0)
MCV: 86.6 fL (ref 80.0–100.0)
Monocytes Absolute: 0.7 10*3/uL (ref 0.1–1.0)
Monocytes Relative: 10 %
Neutro Abs: 5.4 10*3/uL (ref 1.7–7.7)
Neutrophils Relative %: 78 %
Platelet Count: 160 10*3/uL (ref 150–400)
RBC: 3.51 MIL/uL — ABNORMAL LOW (ref 3.87–5.11)
RDW: 14.7 % (ref 11.5–15.5)
WBC Count: 6.9 10*3/uL (ref 4.0–10.5)
nRBC: 0 % (ref 0.0–0.2)

## 2022-09-15 LAB — CEA (ACCESS): CEA (CHCC): 16.18 ng/mL — ABNORMAL HIGH (ref 0.00–5.00)

## 2022-09-15 MED ORDER — SODIUM CHLORIDE 0.9% FLUSH
10.0000 mL | INTRAVENOUS | Status: DC | PRN
  Administered 2022-09-15: 10 mL

## 2022-09-15 MED ORDER — HEPARIN SOD (PORK) LOCK FLUSH 100 UNIT/ML IV SOLN
500.0000 [IU] | Freq: Once | INTRAVENOUS | Status: AC | PRN
  Administered 2022-09-15: 500 [IU]

## 2022-09-15 MED ORDER — MAGNESIUM SULFATE 4 GM/100ML IV SOLN
4.0000 g | Freq: Once | INTRAVENOUS | Status: AC
  Administered 2022-09-15: 4 g via INTRAVENOUS
  Filled 2022-09-15: qty 100

## 2022-09-15 MED ORDER — SODIUM CHLORIDE 0.9 % IV SOLN: Freq: Once | INTRAVENOUS | Status: AC

## 2022-09-15 MED ORDER — SODIUM CHLORIDE 0.9 % IV SOLN
6.0000 mg/kg | Freq: Once | INTRAVENOUS | Status: AC
  Administered 2022-09-15: 400 mg via INTRAVENOUS
  Filled 2022-09-15: qty 20

## 2022-09-15 MED ORDER — NYSTATIN 100000 UNIT/GM EX POWD: 1.0000 | Freq: Three times a day (TID) | CUTANEOUS | 1 refills | Status: DC

## 2022-09-15 MED ORDER — OXYCODONE-ACETAMINOPHEN 5-325 MG PO TABS: 1.0000 | ORAL_TABLET | Freq: Three times a day (TID) | ORAL | 0 refills | Status: DC | PRN

## 2022-09-15 NOTE — Progress Notes (Signed)
[facility] OFFICE PROGRESS NOTE   Diagnosis: Colon cancer  INTERVAL HISTORY:   Stacy Burns returns as scheduled.  She continues encorafenib.  She completed another cycle of Panitumumab 09/01/2022.  She denies nausea/vomiting.  No mouth sores.  No diarrhea.  No hand or foot pain or redness.  She reports progression of the skin rash on her face, in the nasal passages, neck/hairline and scalp.  She recently noted "an inflamed" rash beneath the left breast.  Objective:  Vital signs in last 24 hours:  Blood pressure (!) 145/59, pulse 75, temperature 98.1 F (36.7 C), temperature source Oral, resp. rate 18, height '5\' 7"'$  (1.702 m), weight 139 lb 3.2 oz (63.1 kg), SpO2 100 %.    HEENT: No thrush or ulcers. Resp: Lungs clear bilaterally. Cardio: Regular rate and rhythm. GI: Abdomen soft and nontender.  No hepatosplenomegaly.  No mass. Vascular: No leg edema. Skin: Multiple acne type lesions scattered over the face, mainly nose.  A few small acne type lesions scattered on the scalp and posterior upper neck at the hairline.  Erythematous, well-demarcated rash beneath the left breast. Port-A-Cath without erythema.   Lab Results:  Lab Results  Component Value Date   WBC 6.9 09/15/2022   HGB 9.7 (L) 09/15/2022   HCT 30.4 (L) 09/15/2022   MCV 86.6 09/15/2022   PLT 160 09/15/2022   NEUTROABS 5.4 09/15/2022    Imaging:  No results found.  Medications: I have reviewed the patient's current medications.  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  Cirrhosis.  Innumerable liver lesions. Colonoscopy 12/18/2019-ulcerated partially obstructing large mass in the mid ascending colon.  Biopsy invasive adenocarcinoma; preserved expression major MMR proteins Upper endoscopy 12/18/2019-normal esophagus, stomach, examined duodenum CEA 12/19/2019-8,923 Biopsy liver  lesion 12/23/2019-adenocarcinoma Foundation 1-K-ras wild-type; tumor mutation burden greater than or equal to 10; microsatellite stable; BRAF V600E Cycle 1 FOLFOX 01/01/2020 Cycle 2 FOLFOX 01/15/2020  Cycle 3 FOLFOX 01/29/2020, Udenyca added Cycle 4 FOLFOX 02/12/2020, Udenyca held Cycle 5 FOLFOX 02/26/2020, Udenyca Cycle 6 FOLFOX 03/11/2020, Udenyca held CT abdomen/pelvis 03/16/2020-stable diffuse liver lesions, stable strictured appearing ascending colon, possible pericolonic implant, vague left lower lobe nodule Cycle 1 FOLFIRI/Avastin 04/06/2020 Cycle 2 FOLFIRI/Avastin 04/21/2020 Cycle 3 FOLFIRI/Avastin 05/06/2020 Cycle 4 FOLFIRI/Avastin 05/20/2020 Cycle 5 FOLFIRI/Avastin 06/03/2020 CTs 06/15/2020-mild to moderate improvement in hepatic metastasis.  No new or progressive disease.  Narrowing within the proximal ascending colon with upstream mild cecal and terminal ileum dilatation, similar. Cycle 6 FOLFIRI/Avastin 06/17/2020 Cycle 7 FOLFIRI/Avastin 07/01/2020 Cycle 8 FOLFIRI/Avastin 07/15/2020 Cycle 9 FOLFIRI/Avastin 07/29/2020-5-FU bolus eliminated, 5-FU infusion and irinotecan dose reduced Cycle 10 FOLFIRI/Avastin 08/12/2020 CTs 08/23/2020- mild residual wall thickening involving the cecum/ascending colon.  Numerous liver metastases improved. Cycle 11 FOLFIRI/Avastin 08/25/2020 Cycle 12 FOLFIRI/Avastin 09/16/2020 Cycle 13 FOLFIRI/Avastin 10/07/2020 Cycle 14 FOLFIRI/Avastin 10/28/2020 Cycle 15 FOLFIRI/Avastin 11/18/2020 CT abdomen/pelvis 12/06/2020-mild improvement in multifocal hepatic metastases, wall thickening at the ascending colon with pericolonic inflammatory changes Cycle 16 FOLFIRI/Avastin 12/09/2020 Cycle 17 FOLFIRI/Avastin 12/30/2020 Cycle 18 FOLFIRI/Avastin 01/20/2021 Cycle 19 FOLFIRI/Avastin 02/10/2021 Cycle 20 FOLFIRI/Avastin 03/03/2021 Cycle 21 FOLFIRI/Avastin 03/24/2021 Cycle 22 FOLFIRI/Avastin 04/14/2021 CTs 05/04/2021-slight improvement in hepatic metastatic disease. Cycle 23 FOLFIRI/Avastin  05/05/2021 Cycle 24 FOLFIRI/Avastin 05/25/2021 Cycle 25 FOLFIRI/Avastin 06/15/2021 Cycle 26 FOLFIRI/Avastin 07/13/2021 Cycle 27 FOLFIRI/Avastin 08/02/2021 CTs 08/30/2021-majority of liver lesions are unchanged in size, 1 lesion adjacent to the gallbladder has increased, no adenopathy, persistent pericolonic stranding and peritoneal thickening at the cecum  Progressive rise in CEA 08/31/2021-treatment changed to Xeloda/bevacizumab, began [place] 09/22/2021 CEA stable 11/03/2021 Xeloda/bevacizumab continued CTs 01/10/2022-increase in size of some of the liver lesions, others are stable, new tiny right lower lobe nodule, increased dilation of distal small bowel with increased wall thickening at the terminal ileum and ileocecal valve with 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, stable bilateral paracolic gutter peritoneal thickening Panitumumab/ Encorafenib 01/12/2022 (encorafenib 01/19/2022) Panitumumab 02/02/2022 Panitumumab 02/20/2022 Panitumumab 03/06/2022 Panitumumab 03/20/2022 Panitumumab 04/03/2022 CTs 04/14/2022-decrease size of liver metastases, new borderline subcarinal adenopathy, improved peritoneal thickening, cirrhosis or pseudocirrhosis, persistent dilated cecum Encorafenib and Panitumumab continued Panitumumab 04/18/2022 Panitumumab 05/05/2022 Panitumumab 05/19/2022 Panitumumab 06/02/2022 Panitumumab 06/16/2022, 4g IV mag and begin oral mag po once daily  Panitumumab 06/30/2022 Panitumumab 07/14/2022, 2 g IV magnesium, increase oral magnesium to 400 mg twice daily Panitumumab 07/28/2022, 2 g IV magnesium CTs 07/28/2022-no change in hepatic metastases, no new signs of metastatic disease, persistent mural thickening at the terminal ileum/cecum mild splenomegaly unchanged Panitumumab 08/18/2022 Panitumumab 09/01/2022 Panitumumab 09/15/2022 Anemia secondary to GI blood loss, on oral iron BID  Cirrhosis 12/26/2019-hospital admission for right lower extremity DVT and GI bleed, treated with heparin  anticoagulation beginning 12/26/2019, converted to Lovenox at discharge 12/29/2019 Lovenox reduced to 40 mg daily 02/10/2021 secondary to bruising and nosebleeding History of low-grade fever-likely "tumor" fever COVID-19 infection January 2021      Disposition: Ms. Kapler appears stable.  There is no clinical evidence of disease progression.  Plan to continue encorafenib and Panitumumab.  The skin rash may be related to Panitumumab, encorafenib or both.  She will continue doxycycline.  She will apply lotion twice daily.  She will try over-the-counter hydrocortisone cream on the facial rash, avoiding the area around the eyes.  She will apply Vaseline in the nasal passages.  She understands to contact the office if the rash worsens.  CBC and chemistry panel reviewed.  Labs adequate to proceed as above.  Magnesium level is low.  She will receive IV magnesium today.  She will return as scheduled in 2 weeks.  We are available to see her sooner if needed.    Ned Card ANP/GNP-BC   09/15/2022  8:21 AM

## 2022-09-15 NOTE — Patient Instructions (Signed)
[facility]   Discharge Instructions: Thank you for choosing [facility] to provide your oncology and hematology care.   If you have a lab appointment with the [facility], please go directly to the [facility] and check in at the registration area.   Wear comfortable clothing and clothing appropriate for easy access to any Portacath or PICC line.   We strive to give you quality time with your provider. You may need to reschedule your appointment if you arrive late (15 or more minutes).  Arriving late affects you and other patients whose appointments are after yours.  Also, if you miss three or more appointments without notifying the office, you may be dismissed from the clinic at the provider's discretion.      For prescription refill requests, have your pharmacy contact our office and allow 72 hours for refills to be completed.    Today you received the following chemotherapy and/or immunotherapy agents Panitumumab (VECTIBIX).      To help prevent nausea and vomiting after your treatment, we encourage you to take your nausea medication as directed.  BELOW ARE SYMPTOMS THAT SHOULD BE REPORTED IMMEDIATELY: *FEVER GREATER THAN 100.4 F (38 C) OR HIGHER *CHILLS OR SWEATING *NAUSEA AND VOMITING THAT IS NOT CONTROLLED WITH YOUR NAUSEA MEDICATION *UNUSUAL SHORTNESS OF BREATH *UNUSUAL BRUISING OR BLEEDING *URINARY PROBLEMS (pain or burning when urinating, or frequent urination) *BOWEL PROBLEMS (unusual diarrhea, constipation, pain near the anus) TENDERNESS IN MOUTH AND THROAT WITH OR WITHOUT PRESENCE OF ULCERS (sore throat, sores in mouth, or a toothache) UNUSUAL RASH, SWELLING OR PAIN  UNUSUAL VAGINAL DISCHARGE OR ITCHING   Items with * indicate a potential emergency and should be followed up as soon as possible or go to the Emergency Department if any problems should occur.  Please show the CHEMOTHERAPY ALERT CARD or IMMUNOTHERAPY ALERT  CARD at check-in to the Emergency Department and triage nurse.  Should you have questions after your visit or need to cancel or reschedule your appointment, please contact [facility]  Dept: [phone]  and follow the prompts.  Office hours are 8:00 a.m. to 4:30 p.m. Monday - Friday. Please note that voicemails left after 4:00 p.m. may not be returned until the following business day.  We are closed weekends and major holidays. You have access to a nurse at all times for urgent questions. Please call the main number to the clinic Dept: [phone] and follow the prompts.   For any non-urgent questions, you may also contact your provider using MyChart. We now offer e-Visits for anyone 34 and older to request care online for non-urgent symptoms. For details visit [email or web address].   Also download the MyChart app! Go to the app store, search "MyChart", open the app, select [facility], and log in with your MyChart username and password.  Magnesium Sulfate Injection What is this medication? MAGNESIUM SULFATE (mag NEE zee um SUL fate) prevents and treats low levels of magnesium in your body. It may also be used to prevent and treat seizures during pregnancy in people with high blood pressure disorders, such as preeclampsia or eclampsia. Magnesium plays an important role in maintaining the health of your muscles and nervous system. This medicine may be used for other purposes; ask your health care provider or pharmacist if you have questions. What should I tell my care team before I take this medication? They need to know if you have any of these conditions:  Heart disease History of irregular heart beat Kidney disease An unusual or allergic reaction to magnesium sulfate, medications, foods, dyes, or preservatives Pregnant or trying to get pregnant Breast-feeding How should I use this medication? This medication is for infusion into a vein. It is given in a  hospital or clinic setting. Talk to your care team about the use of this medication in children. While this medication may be prescribed for selected conditions, precautions do apply. Overdosage: If you think you have taken too much of this medicine contact a poison control center or emergency room at once. NOTE: This medicine is only for you. Do not share this medicine with others. What if I miss a dose? This does not apply. What may interact with this medication? Certain medications for anxiety or sleep Certain medications for seizures, such phenobarbital Digoxin Medications that relax muscles for surgery Narcotic medications for pain This list may not describe all possible interactions. Give your health care provider a list of all the medicines, herbs, non-prescription drugs, or dietary supplements you use. Also tell them if you smoke, drink alcohol, or use illegal drugs. Some items may interact with your medicine. What should I watch for while using this medication? Your condition will be monitored carefully while you are receiving this medication. You may need blood work done while you are receiving this medication. What side effects may I notice from receiving this medication? Side effects that you should report to your care team as soon as possible: Allergic reactions--skin rash, itching, hives, swelling of the face, lips, tongue, or throat High magnesium level--confusion, drowsiness, facial flushing, redness, sweating, muscle weakness, fast or irregular heartbeat, trouble breathing Low blood pressure--dizziness, feeling faint or lightheaded, blurry vision Side effects that usually do not require medical attention (report to your care team if they continue or are bothersome): Headache Nausea This list may not describe all possible side effects. Call your doctor for medical advice about side effects. You may report side effects to FDA at 1-800-FDA-1088. Where should I keep my  medication? This medication is given in a hospital or clinic and will not be stored at home. NOTE: This sheet is a summary. It may not cover all possible information. If you have questions about this medicine, talk to your doctor, pharmacist, or health care provider.  2023 Elsevier/Gold Standard (2012-12-06 00:00:00)

## 2022-09-15 NOTE — Patient Instructions (Signed)

## 2022-09-15 NOTE — Progress Notes (Signed)
Patient seen by Ned Card NP today  Vitals are within treatment parameters.  Labs reviewed by Ned Card NP and are within treatment parameters.  Magnesium is 1.4. Patient will receive 4g IVPB Mag  Per physician team, patient is ready for treatment and there are NO modifications to the treatment plan.

## 2022-09-17 ENCOUNTER — Other Ambulatory Visit: Payer: Self-pay

## 2022-09-22 ENCOUNTER — Other Ambulatory Visit: Payer: Self-pay

## 2022-09-23 ENCOUNTER — Other Ambulatory Visit: Payer: Self-pay

## 2022-09-23 ENCOUNTER — Other Ambulatory Visit: Payer: Self-pay | Admitting: Oncology

## 2022-09-29 ENCOUNTER — Inpatient Hospital Stay: Payer: BC Managed Care – PPO

## 2022-09-29 ENCOUNTER — Other Ambulatory Visit (HOSPITAL_COMMUNITY): Payer: Self-pay

## 2022-09-29 ENCOUNTER — Encounter: Payer: Self-pay | Admitting: *Deleted

## 2022-09-29 ENCOUNTER — Encounter: Payer: Self-pay | Admitting: Oncology

## 2022-09-29 ENCOUNTER — Telehealth: Payer: Self-pay

## 2022-09-29 ENCOUNTER — Inpatient Hospital Stay: Payer: BC Managed Care – PPO | Admitting: Oncology

## 2022-09-29 VITALS — BP 144/54 | HR 80 | Resp 18

## 2022-09-29 DIAGNOSIS — K7460 Unspecified cirrhosis of liver: Secondary | ICD-10-CM | POA: Diagnosis not present

## 2022-09-29 DIAGNOSIS — C182 Malignant neoplasm of ascending colon: Secondary | ICD-10-CM | POA: Diagnosis not present

## 2022-09-29 DIAGNOSIS — Z5112 Encounter for antineoplastic immunotherapy: Secondary | ICD-10-CM | POA: Diagnosis not present

## 2022-09-29 DIAGNOSIS — R21 Rash and other nonspecific skin eruption: Secondary | ICD-10-CM | POA: Diagnosis not present

## 2022-09-29 DIAGNOSIS — C787 Secondary malignant neoplasm of liver and intrahepatic bile duct: Secondary | ICD-10-CM | POA: Diagnosis not present

## 2022-09-29 DIAGNOSIS — D500 Iron deficiency anemia secondary to blood loss (chronic): Secondary | ICD-10-CM | POA: Diagnosis not present

## 2022-09-29 DIAGNOSIS — Z86718 Personal history of other venous thrombosis and embolism: Secondary | ICD-10-CM | POA: Diagnosis not present

## 2022-09-29 LAB — CBC WITH DIFFERENTIAL (CANCER CENTER ONLY)
Abs Immature Granulocytes: 0.02 10*3/uL (ref 0.00–0.07)
Basophils Absolute: 0.1 10*3/uL (ref 0.0–0.1)
Basophils Relative: 1 %
Eosinophils Absolute: 0.2 10*3/uL (ref 0.0–0.5)
Eosinophils Relative: 3 %
HCT: 31.4 % — ABNORMAL LOW (ref 36.0–46.0)
Hemoglobin: 10.1 g/dL — ABNORMAL LOW (ref 12.0–15.0)
Immature Granulocytes: 0 %
Lymphocytes Relative: 12 %
Lymphs Abs: 0.7 10*3/uL (ref 0.7–4.0)
MCH: 27.5 pg (ref 26.0–34.0)
MCHC: 32.2 g/dL (ref 30.0–36.0)
MCV: 85.6 fL (ref 80.0–100.0)
Monocytes Absolute: 0.7 10*3/uL (ref 0.1–1.0)
Monocytes Relative: 12 %
Neutro Abs: 4.3 10*3/uL (ref 1.7–7.7)
Neutrophils Relative %: 72 %
Platelet Count: 214 10*3/uL (ref 150–400)
RBC: 3.67 MIL/uL — ABNORMAL LOW (ref 3.87–5.11)
RDW: 15 % (ref 11.5–15.5)
WBC Count: 5.9 10*3/uL (ref 4.0–10.5)
nRBC: 0 % (ref 0.0–0.2)

## 2022-09-29 LAB — CMP (CANCER CENTER ONLY)
ALT: 9 U/L (ref 0–44)
AST: 11 U/L — ABNORMAL LOW (ref 15–41)
Albumin: 3.7 g/dL (ref 3.5–5.0)
Alkaline Phosphatase: 132 U/L — ABNORMAL HIGH (ref 38–126)
Anion gap: 9 (ref 5–15)
BUN: 27 mg/dL — ABNORMAL HIGH (ref 8–23)
CO2: 24 mmol/L (ref 22–32)
Calcium: 9.4 mg/dL (ref 8.9–10.3)
Chloride: 105 mmol/L (ref 98–111)
Creatinine: 0.8 mg/dL (ref 0.44–1.00)
GFR, Estimated: >60 mL/min (ref >60)
Glucose, Bld: 98 mg/dL (ref 70–99)
Potassium: 4.3 mmol/L (ref 3.5–5.1)
Sodium: 138 mmol/L (ref 135–145)
Total Bilirubin: 0.4 mg/dL (ref 0.3–1.2)
Total Protein: 6.7 g/dL (ref 6.5–8.1)

## 2022-09-29 LAB — MAGNESIUM: Magnesium: 1.7 mg/dL (ref 1.7–2.4)

## 2022-09-29 LAB — CEA (ACCESS): CEA (CHCC): 37.15 ng/mL — ABNORMAL HIGH (ref 0.00–5.00)

## 2022-09-29 MED ORDER — SODIUM CHLORIDE 0.9% FLUSH
10.0000 mL | INTRAVENOUS | Status: DC | PRN
  Administered 2022-09-29: 10 mL

## 2022-09-29 MED ORDER — SODIUM CHLORIDE 0.9 % IV SOLN
6.0000 mg/kg | Freq: Once | INTRAVENOUS | Status: AC
  Administered 2022-09-29: 400 mg via INTRAVENOUS
  Filled 2022-09-29: qty 20

## 2022-09-29 MED ORDER — SODIUM CHLORIDE 0.9 % IV SOLN: Freq: Once | INTRAVENOUS | Status: AC

## 2022-09-29 MED ORDER — HEPARIN SOD (PORK) LOCK FLUSH 100 UNIT/ML IV SOLN
500.0000 [IU] | Freq: Once | INTRAVENOUS | Status: AC | PRN
  Administered 2022-09-29: 500 [IU]

## 2022-09-29 NOTE — Progress Notes (Signed)
Reports receipt of bill from [facility] for [phone] for her [place]. She is concerned she will not be able to afford her chemotherapy pills. Notified oral oncology team of this. She thought she had a copay card/grant for this.

## 2022-09-29 NOTE — Progress Notes (Signed)
[name]   Diagnosis: Colon cancer  INTERVAL HISTORY:   Stacy Burns is as scheduled.  She continues encorafenib.  She was last treated with panitumumab on 09/15/2022.  No diarrhea.  She has a rash on the face.  There is a linear ulcer at the left heel.  She reports discomfort in the bilateral shoulder with certain movements.  No abdominal pain.  No bleeding.  Objective:  Vital signs in last 24 hours:  Blood pressure (!) 136/55, pulse 94, temperature 98.1 F (36.7 C), temperature source Oral, resp. rate 18, height 5' 7"$  (1.702 m), weight 138 lb 9.6 oz (62.9 kg), SpO2 99 %.    HEENT: No thrush or ulcers Resp: Lungs clear bilaterally Cardio: Regular rate and rhythm GI: Nontender, no mass, no hepatosplenomegaly Vascular: No leg edema  Skin: Acne type rash over the face, superficial linear ulcer at the left heel, dryness of the heels bilaterally  Portacath/PICC-without erythema  Lab Results:  Lab Results  Component Value Date   WBC 5.9 09/29/2022   HGB 10.1 (L) 09/29/2022   HCT 31.4 (L) 09/29/2022   MCV 85.6 09/29/2022   PLT 214 09/29/2022   NEUTROABS 4.3 09/29/2022    CMP  Lab Results  Component Value Date   NA 138 09/29/2022   K 4.3 09/29/2022   CL 105 09/29/2022   CO2 24 09/29/2022   GLUCOSE 98 09/29/2022   BUN 27 (H) 09/29/2022   CREATININE 0.80 09/29/2022   CALCIUM 9.4 09/29/2022   PROT 6.7 09/29/2022   ALBUMIN 3.7 09/29/2022   AST 11 (L) 09/29/2022   ALT 9 09/29/2022   ALKPHOS 132 (H) 09/29/2022   BILITOT 0.4 09/29/2022   GFRNONAA >60 09/29/2022   GFRAA >60 05/06/2020    Lab Results  Component Value Date   CEA1 54.57 (H) 12/30/2020   CEA 16.18 (H) 09/15/2022    Medications: I have reviewed the patient's current medications.   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  Cirrhosis.  Innumerable  liver lesions. Colonoscopy 12/18/2019-ulcerated partially obstructing large mass in the mid ascending colon.  Biopsy invasive adenocarcinoma; preserved expression major MMR proteins Upper endoscopy 12/18/2019-normal esophagus, stomach, examined duodenum CEA 12/19/2019-8,923 Biopsy liver lesion 12/23/2019-adenocarcinoma Foundation 1-K-ras wild-type; tumor mutation burden greater than or equal to 10; microsatellite stable; BRAF V600E Cycle 1 FOLFOX 01/01/2020 Cycle 2 FOLFOX 01/15/2020  Cycle 3 FOLFOX 01/29/2020, Udenyca added Cycle 4 FOLFOX 02/12/2020, Udenyca held Cycle 5 FOLFOX 02/26/2020, Udenyca Cycle 6 FOLFOX 03/11/2020, Udenyca held CT abdomen/pelvis 03/16/2020-stable diffuse liver lesions, stable strictured appearing ascending colon, possible pericolonic implant, vague left lower lobe nodule Cycle 1 FOLFIRI/Avastin 04/06/2020 Cycle 2 FOLFIRI/Avastin 04/21/2020 Cycle 3 FOLFIRI/Avastin 05/06/2020 Cycle 4 FOLFIRI/Avastin 05/20/2020 Cycle 5 FOLFIRI/Avastin 06/03/2020 CTs 06/15/2020-mild to moderate improvement in hepatic metastasis.  No new or progressive disease.  Narrowing within the proximal ascending colon with upstream mild cecal and terminal ileum dilatation, similar. Cycle 6 FOLFIRI/Avastin 06/17/2020 Cycle 7 FOLFIRI/Avastin 07/01/2020 Cycle 8 FOLFIRI/Avastin 07/15/2020 Cycle 9 FOLFIRI/Avastin 07/29/2020-5-FU bolus eliminated, 5-FU infusion and irinotecan dose reduced Cycle 10 FOLFIRI/Avastin 08/12/2020 CTs 08/23/2020- mild residual wall thickening involving the cecum/ascending colon.  Numerous liver metastases improved. Cycle 11 FOLFIRI/Avastin 08/25/2020 Cycle 12 FOLFIRI/Avastin 09/16/2020 Cycle 13 FOLFIRI/Avastin 10/07/2020 Cycle 14 FOLFIRI/Avastin 10/28/2020 Cycle 15 FOLFIRI/Avastin 11/18/2020 CT abdomen/pelvis 12/06/2020-mild improvement in multifocal hepatic metastases, wall thickening at the ascending colon with pericolonic inflammatory changes Cycle 16 FOLFIRI/Avastin 12/09/2020 Cycle  17 FOLFIRI/Avastin  12/30/2020 Cycle 18 FOLFIRI/Avastin 01/20/2021 Cycle 19 FOLFIRI/Avastin 02/10/2021 Cycle 20 FOLFIRI/Avastin 03/03/2021 Cycle 21 FOLFIRI/Avastin 03/24/2021 Cycle 22 FOLFIRI/Avastin 04/14/2021 CTs 05/04/2021-slight improvement in hepatic metastatic disease. Cycle 23 FOLFIRI/Avastin 05/05/2021 Cycle 24 FOLFIRI/Avastin 05/25/2021 Cycle 25 FOLFIRI/Avastin 06/15/2021 Cycle 26 FOLFIRI/Avastin 07/13/2021 Cycle 27 FOLFIRI/Avastin 08/02/2021 CTs 08/30/2021-majority of liver lesions are unchanged in size, 1 lesion adjacent to the gallbladder has increased, no adenopathy, persistent pericolonic stranding and peritoneal thickening at the cecum Progressive rise in CEA 08/31/2021-treatment changed to Xeloda/bevacizumab, began [place] 09/22/2021 CEA stable 11/03/2021 Xeloda/bevacizumab continued CTs 01/10/2022-increase in size of some of the liver lesions, others are stable, new tiny right lower lobe nodule, increased dilation of distal small bowel with increased wall thickening at the terminal ileum and ileocecal valve with 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, stable bilateral paracolic gutter peritoneal thickening Panitumumab/ Encorafenib 01/12/2022 (encorafenib 01/19/2022) Panitumumab 02/02/2022 Panitumumab 02/20/2022 Panitumumab 03/06/2022 Panitumumab 03/20/2022 Panitumumab 04/03/2022 CTs 04/14/2022-decrease size of liver metastases, new borderline subcarinal adenopathy, improved peritoneal thickening, cirrhosis or pseudocirrhosis, persistent dilated cecum Encorafenib and Panitumumab continued Panitumumab 04/18/2022 Panitumumab 05/05/2022 Panitumumab 05/19/2022 Panitumumab 06/02/2022 Panitumumab 06/16/2022, 4g IV mag and begin oral mag po once daily  Panitumumab 06/30/2022 Panitumumab 07/14/2022, 2 g IV magnesium, increase oral magnesium to 400 mg twice daily Panitumumab 07/28/2022, 2 g IV magnesium CTs 07/28/2022-no change in hepatic metastases, no new signs of metastatic disease, persistent mural thickening at  the terminal ileum/cecum mild splenomegaly unchanged Panitumumab 08/18/2022 Panitumumab 09/01/2022 Panitumumab 09/15/2022 Panitumumab 09/29/2022 Anemia secondary to GI blood loss, on oral iron BID  Cirrhosis 12/26/2019-hospital admission for right lower extremity DVT and GI bleed, treated with heparin anticoagulation beginning 12/26/2019, converted to Lovenox at discharge 12/29/2019 Lovenox reduced to 40 mg daily 02/10/2021 secondary to bruising and nosebleeding History of low-grade fever-likely "tumor" fever COVID-19 infection January 2021       Disposition: Stacy Burns appears stable.  She continues to tolerate the panitumumab and encorafenib well.  The plan is to continue treatment on the current regimen.  She will use moisturizers at the heels.  Stacy Burns will completed the treatment with panitumumab today.  She will return for an office visit and panitumumab in 2 weeks.  She will be scheduled for restaging CTs after the treatment on October 27, 2022.  [name], MD  09/29/2022  9:15 AM

## 2022-09-29 NOTE — Telephone Encounter (Signed)
Left VM for patient to call me back to inform them that new co-pay card had been given to [facility] and they have re-billed her $4800 to $0. Patient does NOT owe anything to [facility] per representative.   Berdine Addison, [facility] Patient [place]  [phone] (phone) [phone] (fax) 09/29/2022 9:24 AM

## 2022-09-29 NOTE — Telephone Encounter (Signed)
Oral Oncology Patient Advocate Encounter  Received notification from MD Office that patient stated they received a bill from [facility] for their medication.   Was successful in obtaining a copay card for Braftovi.  This copay card will make the patients copay as little as $0.  I have spoken with the patient.    The billing information is as follows and has been shared with [facility].   RxBin: [id number] PCN:  Member ID: [id number] Group ID: [id number]   Berdine Addison, [facility] Patient [place]  [phone] (phone) [phone] (fax) 09/29/2022 8:55 AM

## 2022-09-29 NOTE — Patient Instructions (Signed)
[facility]   Discharge Instructions: Thank you for choosing [facility] to provide your oncology and hematology care.   If you have a lab appointment with the [facility], please go directly to the [facility] and check in at the registration area.   Wear comfortable clothing and clothing appropriate for easy access to any Portacath or PICC line.   We strive to give you quality time with your provider. You may need to reschedule your appointment if you arrive late (15 or more minutes).  Arriving late affects you and other patients whose appointments are after yours.  Also, if you miss three or more appointments without notifying the office, you may be dismissed from the clinic at the provider's discretion.      For prescription refill requests, have your pharmacy contact our office and allow 72 hours for refills to be completed.    Today you received the following chemotherapy and/or immunotherapy agents Panitumumab (VECTIBIX).      To help prevent nausea and vomiting after your treatment, we encourage you to take your nausea medication as directed.  BELOW ARE SYMPTOMS THAT SHOULD BE REPORTED IMMEDIATELY: *FEVER GREATER THAN 100.4 F (38 C) OR HIGHER *CHILLS OR SWEATING *NAUSEA AND VOMITING THAT IS NOT CONTROLLED WITH YOUR NAUSEA MEDICATION *UNUSUAL SHORTNESS OF BREATH *UNUSUAL BRUISING OR BLEEDING *URINARY PROBLEMS (pain or burning when urinating, or frequent urination) *BOWEL PROBLEMS (unusual diarrhea, constipation, pain near the anus) TENDERNESS IN MOUTH AND THROAT WITH OR WITHOUT PRESENCE OF ULCERS (sore throat, sores in mouth, or a toothache) UNUSUAL RASH, SWELLING OR PAIN  UNUSUAL VAGINAL DISCHARGE OR ITCHING   Items with * indicate a potential emergency and should be followed up as soon as possible or go to the Emergency Department if any problems should occur.  Please show the CHEMOTHERAPY ALERT CARD or IMMUNOTHERAPY ALERT  CARD at check-in to the Emergency Department and triage nurse.  Should you have questions after your visit or need to cancel or reschedule your appointment, please contact [facility]  Dept: [phone]  and follow the prompts.  Office hours are 8:00 a.m. to 4:30 p.m. Monday - Friday. Please note that voicemails left after 4:00 p.m. may not be returned until the following business day.  We are closed weekends and major holidays. You have access to a nurse at all times for urgent questions. Please call the main number to the clinic Dept: [phone] and follow the prompts.   For any non-urgent questions, you may also contact your provider using MyChart. We now offer e-Visits for anyone 20 and older to request care online for non-urgent symptoms. For details visit [email or web address].   Also download the MyChart app! Go to the app store, search "MyChart", open the app, select [facility], and log in with your MyChart username and password.  Panitumumab Injection What is this medication? PANITUMUMAB (pan i TOOM ue mab) treats colorectal cancer. It works by blocking a protein that causes cancer cells to grow and multiply. This helps to slow or stop the spread of cancer cells. It is a monoclonal antibody. This medicine may be used for other purposes; ask your health care provider or pharmacist if you have questions. COMMON BRAND NAME(S): Vectibix What should I tell my care team before I take this medication? They need to know if you have any of these conditions: Eye disease Low levels of magnesium in the blood Lung disease An unusual or allergic  reaction to panitumumab, other medications, foods, dyes, or preservatives Pregnant or trying to get pregnant Breast-feeding How should I use this medication? This medication is injected into a vein. It is given by your care team in a hospital or clinic setting. Talk to your care team about the use of this medication  in children. Special care may be needed. Overdosage: If you think you have taken too much of this medicine contact a poison control center or emergency room at once. NOTE: This medicine is only for you. Do not share this medicine with others. What if I miss a dose? Keep appointments for follow-up doses. It is important not to miss your dose. Call your care team if you are unable to keep an appointment. What may interact with this medication? Bevacizumab This list may not describe all possible interactions. Give your health care provider a list of all the medicines, herbs, non-prescription drugs, or dietary supplements you use. Also tell them if you smoke, drink alcohol, or use illegal drugs. Some items may interact with your medicine. What should I watch for while using this medication? Your condition will be monitored carefully while you are receiving this medication. This medication may make you feel generally unwell. This is not uncommon as chemotherapy can affect healthy cells as well as cancer cells. Report any side effects. Continue your course of treatment even though you feel ill unless your care team tells you to stop. This medication can make you more sensitive to the sun. Keep out of the sun while receiving this medication and for 2 months after stopping therapy. If you cannot avoid being in the sun, wear protective clothing and sunscreen. Do not use sun lamps, tanning beds, or tanning booths. Check with your care team if you have severe diarrhea, nausea, and vomiting or if you sweat a lot. The loss of too much body fluid may make it dangerous for you to take this medication. This medication may cause serious skin reactions. They can happen weeks to months after starting the medication. Contact your care team right away if you notice fevers or flu-like symptoms with a rash. The rash may be red or purple and then turn into blisters or peeling of the skin. You may also notice a red rash with  swelling of the face, lips, or lymph nodes in your neck or under your arms. Talk to your care team if you may be pregnant. Serious birth defects can occur if you take this medication during pregnancy and for 2 months after the last dose. Contraception is recommended while taking this medication and for 2 months after the last dose. Your care team can help you find the option that works for you. Do not breastfeed while taking this medication and for 2 months after the last dose. This medication may cause infertility. Talk to your care team if you are concerned about your fertility. What side effects may I notice from receiving this medication? Side effects that you should report to your care team as soon as possible: Allergic reactions--skin rash, itching, hives, swelling of the face, lips, tongue, or throat Dry cough, shortness of breath or trouble breathing Eye pain, redness, irritation, or discharge with blurry or decreased vision Infusion reactions--chest pain, shortness of breath or trouble breathing, feeling faint or lightheaded Low magnesium level--muscle pain or cramps, unusual weakness or fatigue, fast or irregular heartbeat, tremors Low potassium level--muscle pain or cramps, unusual weakness or fatigue, fast or irregular heartbeat, constipation Redness, blistering, peeling, or loosening  of the skin, including inside the mouth Skin reactions on sun-exposed areas Side effects that usually do not require medical attention (report to your care team if they continue or are bothersome): Change in nail shape, thickness, or color Diarrhea Dry skin Fatigue Nausea Vomiting This list may not describe all possible side effects. Call your doctor for medical advice about side effects. You may report side effects to FDA at 1-800-FDA-1088. Where should I keep my medication? This medication is given in a hospital or clinic. It will not be stored at home. NOTE: This sheet is a summary. It may not  cover all possible information. If you have questions about this medicine, talk to your doctor, pharmacist, or health care provider.  2023 Elsevier/Gold Standard (2021-12-14 00:00:00)

## 2022-09-29 NOTE — Telephone Encounter (Signed)
Patient is at MD Office and they have let the patient know.

## 2022-09-29 NOTE — Progress Notes (Signed)
Patient seen by Dr. Sherrill today ? ?Vitals are within treatment parameters. ? ?Labs reviewed by Dr. Sherrill and are within treatment parameters. ? ?Per physician team, patient is ready for treatment and there are NO modifications to the treatment plan.  ?

## 2022-10-06 ENCOUNTER — Other Ambulatory Visit: Payer: Self-pay | Admitting: *Deleted

## 2022-10-06 DIAGNOSIS — C182 Malignant neoplasm of ascending colon: Secondary | ICD-10-CM

## 2022-10-06 MED ORDER — BRAFTOVI 75 MG PO CAPS: ORAL_CAPSULE | ORAL | 1 refills | Status: DC

## 2022-10-06 NOTE — Telephone Encounter (Signed)
Received refill request from [facility] for Braftovi.

## 2022-10-08 ENCOUNTER — Other Ambulatory Visit: Payer: Self-pay | Admitting: Oncology

## 2022-10-08 ENCOUNTER — Other Ambulatory Visit: Payer: Self-pay

## 2022-10-13 ENCOUNTER — Inpatient Hospital Stay: Payer: BC Managed Care – PPO

## 2022-10-13 ENCOUNTER — Encounter: Payer: Self-pay | Admitting: Nurse Practitioner

## 2022-10-13 ENCOUNTER — Inpatient Hospital Stay: Payer: BC Managed Care – PPO | Attending: Oncology

## 2022-10-13 ENCOUNTER — Other Ambulatory Visit: Payer: Self-pay

## 2022-10-13 ENCOUNTER — Inpatient Hospital Stay (HOSPITAL_BASED_OUTPATIENT_CLINIC_OR_DEPARTMENT_OTHER): Payer: BC Managed Care – PPO | Admitting: Nurse Practitioner

## 2022-10-13 VITALS — BP 150/63 | HR 80 | Temp 98.2°F | Resp 18 | Ht 67.0 in | Wt 140.6 lb

## 2022-10-13 VITALS — BP 149/66 | HR 75 | Resp 18

## 2022-10-13 DIAGNOSIS — K7460 Unspecified cirrhosis of liver: Secondary | ICD-10-CM | POA: Diagnosis not present

## 2022-10-13 DIAGNOSIS — C182 Malignant neoplasm of ascending colon: Secondary | ICD-10-CM | POA: Insufficient documentation

## 2022-10-13 DIAGNOSIS — Z7901 Long term (current) use of anticoagulants: Secondary | ICD-10-CM | POA: Diagnosis not present

## 2022-10-13 DIAGNOSIS — D500 Iron deficiency anemia secondary to blood loss (chronic): Secondary | ICD-10-CM | POA: Diagnosis not present

## 2022-10-13 DIAGNOSIS — Z5112 Encounter for antineoplastic immunotherapy: Secondary | ICD-10-CM | POA: Diagnosis not present

## 2022-10-13 DIAGNOSIS — C787 Secondary malignant neoplasm of liver and intrahepatic bile duct: Secondary | ICD-10-CM | POA: Insufficient documentation

## 2022-10-13 DIAGNOSIS — Z86718 Personal history of other venous thrombosis and embolism: Secondary | ICD-10-CM | POA: Diagnosis not present

## 2022-10-13 LAB — CBC WITH DIFFERENTIAL (CANCER CENTER ONLY)
Abs Immature Granulocytes: 0.02 10*3/uL (ref 0.00–0.07)
Basophils Absolute: 0 10*3/uL (ref 0.0–0.1)
Basophils Relative: 1 %
Eosinophils Absolute: 0.2 10*3/uL (ref 0.0–0.5)
Eosinophils Relative: 3 %
HCT: 31.8 % — ABNORMAL LOW (ref 36.0–46.0)
Hemoglobin: 10.1 g/dL — ABNORMAL LOW (ref 12.0–15.0)
Immature Granulocytes: 0 %
Lymphocytes Relative: 16 %
Lymphs Abs: 0.9 10*3/uL (ref 0.7–4.0)
MCH: 27.7 pg (ref 26.0–34.0)
MCHC: 31.8 g/dL (ref 30.0–36.0)
MCV: 87.1 fL (ref 80.0–100.0)
Monocytes Absolute: 0.7 10*3/uL (ref 0.1–1.0)
Monocytes Relative: 13 %
Neutro Abs: 3.6 10*3/uL (ref 1.7–7.7)
Neutrophils Relative %: 67 %
Platelet Count: 160 10*3/uL (ref 150–400)
RBC: 3.65 MIL/uL — ABNORMAL LOW (ref 3.87–5.11)
RDW: 15.8 % — ABNORMAL HIGH (ref 11.5–15.5)
WBC Count: 5.4 10*3/uL (ref 4.0–10.5)
nRBC: 0 % (ref 0.0–0.2)

## 2022-10-13 LAB — CMP (CANCER CENTER ONLY)
ALT: 16 U/L (ref 0–44)
AST: 21 U/L (ref 15–41)
Albumin: 3.6 g/dL (ref 3.5–5.0)
Alkaline Phosphatase: 99 U/L (ref 38–126)
Anion gap: 7 (ref 5–15)
BUN: 23 mg/dL (ref 8–23)
CO2: 25 mmol/L (ref 22–32)
Calcium: 9.1 mg/dL (ref 8.9–10.3)
Chloride: 107 mmol/L (ref 98–111)
Creatinine: 0.7 mg/dL (ref 0.44–1.00)
GFR, Estimated: >60 mL/min (ref >60)
Glucose, Bld: 87 mg/dL (ref 70–99)
Potassium: 4.4 mmol/L (ref 3.5–5.1)
Sodium: 139 mmol/L (ref 135–145)
Total Bilirubin: 0.4 mg/dL (ref 0.3–1.2)
Total Protein: 6.7 g/dL (ref 6.5–8.1)

## 2022-10-13 LAB — MAGNESIUM: Magnesium: 1.4 mg/dL — ABNORMAL LOW (ref 1.7–2.4)

## 2022-10-13 LAB — CEA (ACCESS): CEA (CHCC): 19.31 ng/mL — ABNORMAL HIGH (ref 0.00–5.00)

## 2022-10-13 MED ORDER — SODIUM CHLORIDE 0.9% FLUSH
10.0000 mL | INTRAVENOUS | Status: DC | PRN
  Administered 2022-10-13: 10 mL

## 2022-10-13 MED ORDER — SODIUM CHLORIDE 0.9 % IV SOLN
6.0000 mg/kg | Freq: Once | INTRAVENOUS | Status: AC
  Administered 2022-10-13: 400 mg via INTRAVENOUS
  Filled 2022-10-13: qty 20

## 2022-10-13 MED ORDER — HEPARIN SOD (PORK) LOCK FLUSH 100 UNIT/ML IV SOLN
500.0000 [IU] | Freq: Once | INTRAVENOUS | Status: AC | PRN
  Administered 2022-10-13: 500 [IU]

## 2022-10-13 MED ORDER — MAGNESIUM SULFATE 4 GM/100ML IV SOLN
4.0000 g | Freq: Once | INTRAVENOUS | Status: AC
  Administered 2022-10-13: 4 g via INTRAVENOUS
  Filled 2022-10-13: qty 100

## 2022-10-13 MED ORDER — SODIUM CHLORIDE 0.9 % IV SOLN: Freq: Once | INTRAVENOUS | Status: AC

## 2022-10-13 MED ORDER — OXYCODONE-ACETAMINOPHEN 5-325 MG PO TABS: 1.0000 | ORAL_TABLET | Freq: Three times a day (TID) | ORAL | 0 refills | Status: DC | PRN

## 2022-10-13 NOTE — Progress Notes (Signed)
[facility] OFFICE PROGRESS NOTE   Diagnosis: Colon cancer  INTERVAL HISTORY:   [name] returns as scheduled.  She continues encorafenib.  She completed another treatment with Panitumumab 09/29/2022.  Skin rash is better.  No nausea or vomiting.  No diarrhea.  No abdominal pain.  She has a good appetite.  Weight is stable.  She notes a small amount of bleeding with nose blowing in the morning time.  No other bleeding.  Objective:  Vital signs in last 24 hours:  Blood pressure (!) 150/63, pulse 80, temperature 98.2 F (36.8 C), temperature source Oral, resp. rate 18, height '5\' 7"'$  (1.702 m), weight 140 lb 9.6 oz (63.8 kg), SpO2 100 %.    HEENT: No thrush or ulcers. Resp: Lungs clear bilaterally. Cardio: Regular rate and rhythm. GI: Abdomen soft and nontender.  No hepatosplenomegaly. Vascular: No leg edema. Neuro: Alert and oriented. Skin: Mild acne type rash over the face. Port-A-Cath without erythema.  Lab Results:  Lab Results  Component Value Date   WBC 5.4 10/13/2022   HGB 10.1 (L) 10/13/2022   HCT 31.8 (L) 10/13/2022   MCV 87.1 10/13/2022   PLT 160 10/13/2022   NEUTROABS 3.6 10/13/2022    Imaging:  No results found.  Medications: I have reviewed the patient's current medications.  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  Cirrhosis.  Innumerable liver lesions. Colonoscopy 12/18/2019-ulcerated partially obstructing large mass in the mid ascending colon.  Biopsy invasive adenocarcinoma; preserved expression major MMR proteins Upper endoscopy 12/18/2019-normal esophagus, stomach, examined duodenum CEA 12/19/2019-8,923 Biopsy liver lesion 12/23/2019-adenocarcinoma Foundation 1-K-ras wild-type; tumor mutation burden greater than or equal to 10; microsatellite stable; BRAF V600E Cycle 1 FOLFOX 01/01/2020 Cycle 2 FOLFOX 01/15/2020  Cycle 3  FOLFOX 01/29/2020, Udenyca added Cycle 4 FOLFOX 02/12/2020, Udenyca held Cycle 5 FOLFOX 02/26/2020, Udenyca Cycle 6 FOLFOX 03/11/2020, Udenyca held CT abdomen/pelvis 03/16/2020-stable diffuse liver lesions, stable strictured appearing ascending colon, possible pericolonic implant, vague left lower lobe nodule Cycle 1 FOLFIRI/Avastin 04/06/2020 Cycle 2 FOLFIRI/Avastin 04/21/2020 Cycle 3 FOLFIRI/Avastin 05/06/2020 Cycle 4 FOLFIRI/Avastin 05/20/2020 Cycle 5 FOLFIRI/Avastin 06/03/2020 CTs 06/15/2020-mild to moderate improvement in hepatic metastasis.  No new or progressive disease.  Narrowing within the proximal ascending colon with upstream mild cecal and terminal ileum dilatation, similar. Cycle 6 FOLFIRI/Avastin 06/17/2020 Cycle 7 FOLFIRI/Avastin 07/01/2020 Cycle 8 FOLFIRI/Avastin 07/15/2020 Cycle 9 FOLFIRI/Avastin 07/29/2020-5-FU bolus eliminated, 5-FU infusion and irinotecan dose reduced Cycle 10 FOLFIRI/Avastin 08/12/2020 CTs 08/23/2020- mild residual wall thickening involving the cecum/ascending colon.  Numerous liver metastases improved. Cycle 11 FOLFIRI/Avastin 08/25/2020 Cycle 12 FOLFIRI/Avastin 09/16/2020 Cycle 13 FOLFIRI/Avastin 10/07/2020 Cycle 14 FOLFIRI/Avastin 10/28/2020 Cycle 15 FOLFIRI/Avastin 11/18/2020 CT abdomen/pelvis 12/06/2020-mild improvement in multifocal hepatic metastases, wall thickening at the ascending colon with pericolonic inflammatory changes Cycle 16 FOLFIRI/Avastin 12/09/2020 Cycle 17 FOLFIRI/Avastin 12/30/2020 Cycle 18 FOLFIRI/Avastin 01/20/2021 Cycle 19 FOLFIRI/Avastin 02/10/2021 Cycle 20 FOLFIRI/Avastin 03/03/2021 Cycle 21 FOLFIRI/Avastin 03/24/2021 Cycle 22 FOLFIRI/Avastin 04/14/2021 CTs 05/04/2021-slight improvement in hepatic metastatic disease. Cycle 23 FOLFIRI/Avastin 05/05/2021 Cycle 24 FOLFIRI/Avastin 05/25/2021 Cycle 25 FOLFIRI/Avastin 06/15/2021 Cycle 26 FOLFIRI/Avastin 07/13/2021 Cycle 27 FOLFIRI/Avastin 08/02/2021 CTs 08/30/2021-majority of liver lesions are unchanged in  size, 1 lesion adjacent to the gallbladder has increased, no adenopathy, persistent pericolonic stranding and peritoneal thickening at the cecum Progressive rise in CEA 08/31/2021-treatment changed to Xeloda/bevacizumab, began [place] 09/22/2021 CEA stable 11/03/2021 Xeloda/bevacizumab continued CTs 01/10/2022-increase in size of some of the liver lesions, others are stable, new tiny  right lower lobe nodule, increased dilation of distal small bowel with increased wall thickening at the terminal ileum and ileocecal valve with 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, stable bilateral paracolic gutter peritoneal thickening Panitumumab/ Encorafenib 01/12/2022 (encorafenib 01/19/2022) Panitumumab 02/02/2022 Panitumumab 02/20/2022 Panitumumab 03/06/2022 Panitumumab 03/20/2022 Panitumumab 04/03/2022 CTs 04/14/2022-decrease size of liver metastases, new borderline subcarinal adenopathy, improved peritoneal thickening, cirrhosis or pseudocirrhosis, persistent dilated cecum Encorafenib and Panitumumab continued Panitumumab 04/18/2022 Panitumumab 05/05/2022 Panitumumab 05/19/2022 Panitumumab 06/02/2022 Panitumumab 06/16/2022, 4g IV mag and begin oral mag po once daily  Panitumumab 06/30/2022 Panitumumab 07/14/2022, 2 g IV magnesium, increase oral magnesium to 400 mg twice daily Panitumumab 07/28/2022, 2 g IV magnesium CTs 07/28/2022-no change in hepatic metastases, no new signs of metastatic disease, persistent mural thickening at the terminal ileum/cecum mild splenomegaly unchanged Panitumumab 08/18/2022 Panitumumab 09/01/2022 Panitumumab 09/15/2022 Panitumumab 09/29/2022 Panitumumab 10/13/2022 Anemia secondary to GI blood loss, on oral iron BID  Cirrhosis 12/26/2019-hospital admission for right lower extremity DVT and GI bleed, treated with heparin anticoagulation beginning 12/26/2019, converted to Lovenox at discharge 12/29/2019 Lovenox reduced to 40 mg daily 02/10/2021 secondary to bruising and nosebleeding History  of low-grade fever-likely "tumor" fever COVID-19 infection January 2021      Disposition: Stacy Burns appears stable.  There is no clinical evidence of disease progression.  Plan to continue encorafenib and every 2-week Panitumumab.  CBC reviewed.  Counts adequate to proceed with treatment as above.  She will return for lab, follow-up, Panitumumab in 2 weeks.  We are available to see her sooner if needed.     Ned Card ANP/GNP-BC   10/13/2022  8:41 AM

## 2022-10-13 NOTE — Progress Notes (Signed)
Patient seen by Ned Card, NP today  Vitals are within treatment parameters.   Labs reviewed by Ned Card, NP and are NOT within treatment parameters., Mg 1.4.  Per physician team, patient is ready for treatment. Per Ned Card, NP ok to proceed with Mg 1.4, Patient to receive 4g Mg IV during infusion appt today.

## 2022-10-13 NOTE — Patient Instructions (Addendum)
[facility]   Discharge Instructions: Thank you for choosing [facility] to provide your oncology and hematology care.   If you have a lab appointment with the [facility], please go directly to the [facility] and check in at the registration area.   Wear comfortable clothing and clothing appropriate for easy access to any Portacath or PICC line.   We strive to give you quality time with your provider. You may need to reschedule your appointment if you arrive late (15 or more minutes).  Arriving late affects you and other patients whose appointments are after yours.  Also, if you miss three or more appointments without notifying the office, you may be dismissed from the clinic at the provider's discretion.      For prescription refill requests, have your pharmacy contact our office and allow 72 hours for refills to be completed.    Today you received the following chemotherapy and/or immunotherapy agents Panitumumab (VECTIBIX).      To help prevent nausea and vomiting after your treatment, we encourage you to take your nausea medication as directed.  BELOW ARE SYMPTOMS THAT SHOULD BE REPORTED IMMEDIATELY: *FEVER GREATER THAN 100.4 F (38 C) OR HIGHER *CHILLS OR SWEATING *NAUSEA AND VOMITING THAT IS NOT CONTROLLED WITH YOUR NAUSEA MEDICATION *UNUSUAL SHORTNESS OF BREATH *UNUSUAL BRUISING OR BLEEDING *URINARY PROBLEMS (pain or burning when urinating, or frequent urination) *BOWEL PROBLEMS (unusual diarrhea, constipation, pain near the anus) TENDERNESS IN MOUTH AND THROAT WITH OR WITHOUT PRESENCE OF ULCERS (sore throat, sores in mouth, or a toothache) UNUSUAL RASH, SWELLING OR PAIN  UNUSUAL VAGINAL DISCHARGE OR ITCHING   Items with * indicate a potential emergency and should be followed up as soon as possible or go to the Emergency Department if any problems should occur.  Please show the CHEMOTHERAPY ALERT CARD or IMMUNOTHERAPY ALERT  CARD at check-in to the Emergency Department and triage nurse.  Should you have questions after your visit or need to cancel or reschedule your appointment, please contact [facility]  Dept: [phone]  and follow the prompts.  Office hours are 8:00 a.m. to 4:30 p.m. Monday - Friday. Please note that voicemails left after 4:00 p.m. may not be returned until the following business day.  We are closed weekends and major holidays. You have access to a nurse at all times for urgent questions. Please call the main number to the clinic Dept: [phone] and follow the prompts.   For any non-urgent questions, you may also contact your provider using MyChart. We now offer e-Visits for anyone 53 and older to request care online for non-urgent symptoms. For details visit [email or web address].   Also download the MyChart app! Go to the app store, search "MyChart", open the app, select [facility], and log in with your MyChart username and password.  Panitumumab Injection What is this medication? PANITUMUMAB (pan i TOOM ue mab) treats colorectal cancer. It works by blocking a protein that causes cancer cells to grow and multiply. This helps to slow or stop the spread of cancer cells. It is a monoclonal antibody. This medicine may be used for other purposes; ask your health care provider or pharmacist if you have questions. COMMON BRAND NAME(S): Vectibix What should I tell my care team before I take this medication? They need to know if you have any of these conditions: Eye disease Low levels of magnesium in the blood Lung disease An unusual or allergic  reaction to panitumumab, other medications, foods, dyes, or preservatives Pregnant or trying to get pregnant Breast-feeding How should I use this medication? This medication is injected into a vein. It is given by your care team in a hospital or clinic setting. Talk to your care team about the use of this medication  in children. Special care may be needed. Overdosage: If you think you have taken too much of this medicine contact a poison control center or emergency room at once. NOTE: This medicine is only for you. Do not share this medicine with others. What if I miss a dose? Keep appointments for follow-up doses. It is important not to miss your dose. Call your care team if you are unable to keep an appointment. What may interact with this medication? Bevacizumab This list may not describe all possible interactions. Give your health care provider a list of all the medicines, herbs, non-prescription drugs, or dietary supplements you use. Also tell them if you smoke, drink alcohol, or use illegal drugs. Some items may interact with your medicine. What should I watch for while using this medication? Your condition will be monitored carefully while you are receiving this medication. This medication may make you feel generally unwell. This is not uncommon as chemotherapy can affect healthy cells as well as cancer cells. Report any side effects. Continue your course of treatment even though you feel ill unless your care team tells you to stop. This medication can make you more sensitive to the sun. Keep out of the sun while receiving this medication and for 2 months after stopping therapy. If you cannot avoid being in the sun, wear protective clothing and sunscreen. Do not use sun lamps, tanning beds, or tanning booths. Check with your care team if you have severe diarrhea, nausea, and vomiting or if you sweat a lot. The loss of too much body fluid may make it dangerous for you to take this medication. This medication may cause serious skin reactions. They can happen weeks to months after starting the medication. Contact your care team right away if you notice fevers or flu-like symptoms with a rash. The rash may be red or purple and then turn into blisters or peeling of the skin. You may also notice a red rash with  swelling of the face, lips, or lymph nodes in your neck or under your arms. Talk to your care team if you may be pregnant. Serious birth defects can occur if you take this medication during pregnancy and for 2 months after the last dose. Contraception is recommended while taking this medication and for 2 months after the last dose. Your care team can help you find the option that works for you. Do not breastfeed while taking this medication and for 2 months after the last dose. This medication may cause infertility. Talk to your care team if you are concerned about your fertility. What side effects may I notice from receiving this medication? Side effects that you should report to your care team as soon as possible: Allergic reactions--skin rash, itching, hives, swelling of the face, lips, tongue, or throat Dry cough, shortness of breath or trouble breathing Eye pain, redness, irritation, or discharge with blurry or decreased vision Infusion reactions--chest pain, shortness of breath or trouble breathing, feeling faint or lightheaded Low magnesium level--muscle pain or cramps, unusual weakness or fatigue, fast or irregular heartbeat, tremors Low potassium level--muscle pain or cramps, unusual weakness or fatigue, fast or irregular heartbeat, constipation Redness, blistering, peeling, or loosening  of the skin, including inside the mouth Skin reactions on sun-exposed areas Side effects that usually do not require medical attention (report to your care team if they continue or are bothersome): Change in nail shape, thickness, or color Diarrhea Dry skin Fatigue Nausea Vomiting This list may not describe all possible side effects. Call your doctor for medical advice about side effects. You may report side effects to FDA at 1-800-FDA-1088. Where should I keep my medication? This medication is given in a hospital or clinic. It will not be stored at home. NOTE: This sheet is a summary. It may not  cover all possible information. If you have questions about this medicine, talk to your doctor, pharmacist, or health care provider.  2023 Elsevier/Gold Standard (2021-12-12 00:00:00)  Magnesium Sulfate Injection What is this medication? MAGNESIUM SULFATE (mag NEE zee um SUL fate) prevents and treats low levels of magnesium in your body. It may also be used to prevent and treat seizures during pregnancy in people with high blood pressure disorders, such as preeclampsia or eclampsia. Magnesium plays an important role in maintaining the health of your muscles and nervous system. This medicine may be used for other purposes; ask your health care provider or pharmacist if you have questions. What should I tell my care team before I take this medication? They need to know if you have any of these conditions: Heart disease History of irregular heart beat Kidney disease An unusual or allergic reaction to magnesium sulfate, medications, foods, dyes, or preservatives Pregnant or trying to get pregnant Breast-feeding How should I use this medication? This medication is for infusion into a vein. It is given in a hospital or clinic setting. Talk to your care team about the use of this medication in children. While this medication may be prescribed for selected conditions, precautions do apply. Overdosage: If you think you have taken too much of this medicine contact a poison control center or emergency room at once. NOTE: This medicine is only for you. Do not share this medicine with others. What if I miss a dose? This does not apply. What may interact with this medication? Certain medications for anxiety or sleep Certain medications for seizures, such phenobarbital Digoxin Medications that relax muscles for surgery Narcotic medications for pain This list may not describe all possible interactions. Give your health care provider a list of all the medicines, herbs, non-prescription drugs, or dietary  supplements you use. Also tell them if you smoke, drink alcohol, or use illegal drugs. Some items may interact with your medicine. What should I watch for while using this medication? Your condition will be monitored carefully while you are receiving this medication. You may need blood work done while you are receiving this medication. What side effects may I notice from receiving this medication? Side effects that you should report to your care team as soon as possible: Allergic reactions--skin rash, itching, hives, swelling of the face, lips, tongue, or throat High magnesium level--confusion, drowsiness, facial flushing, redness, sweating, muscle weakness, fast or irregular heartbeat, trouble breathing Low blood pressure--dizziness, feeling faint or lightheaded, blurry vision Side effects that usually do not require medical attention (report to your care team if they continue or are bothersome): Headache Nausea This list may not describe all possible side effects. Call your doctor for medical advice about side effects. You may report side effects to FDA at 1-800-FDA-1088. Where should I keep my medication? This medication is given in a hospital or clinic and will not be stored  at home. NOTE: This sheet is a summary. It may not cover all possible information. If you have questions about this medicine, talk to your doctor, pharmacist, or health care provider.  2023 Elsevier/Gold Standard (2012-12-06 00:00:00)

## 2022-10-18 ENCOUNTER — Other Ambulatory Visit: Payer: Self-pay

## 2022-10-20 ENCOUNTER — Other Ambulatory Visit: Payer: Self-pay | Admitting: Oncology

## 2022-10-20 DIAGNOSIS — C182 Malignant neoplasm of ascending colon: Secondary | ICD-10-CM

## 2022-10-22 ENCOUNTER — Other Ambulatory Visit: Payer: Self-pay | Admitting: Oncology

## 2022-10-27 ENCOUNTER — Encounter: Payer: Self-pay | Admitting: *Deleted

## 2022-10-27 ENCOUNTER — Inpatient Hospital Stay: Payer: BC Managed Care – PPO

## 2022-10-27 ENCOUNTER — Inpatient Hospital Stay: Payer: BC Managed Care – PPO | Admitting: Oncology

## 2022-10-27 VITALS — BP 137/63 | HR 83 | Temp 98.2°F | Resp 18 | Ht 67.0 in | Wt 141.6 lb

## 2022-10-27 DIAGNOSIS — D649 Anemia, unspecified: Secondary | ICD-10-CM

## 2022-10-27 DIAGNOSIS — C182 Malignant neoplasm of ascending colon: Secondary | ICD-10-CM

## 2022-10-27 DIAGNOSIS — K7460 Unspecified cirrhosis of liver: Secondary | ICD-10-CM | POA: Diagnosis not present

## 2022-10-27 DIAGNOSIS — Z7901 Long term (current) use of anticoagulants: Secondary | ICD-10-CM | POA: Diagnosis not present

## 2022-10-27 DIAGNOSIS — Z5112 Encounter for antineoplastic immunotherapy: Secondary | ICD-10-CM | POA: Diagnosis not present

## 2022-10-27 DIAGNOSIS — C787 Secondary malignant neoplasm of liver and intrahepatic bile duct: Secondary | ICD-10-CM | POA: Diagnosis not present

## 2022-10-27 DIAGNOSIS — D500 Iron deficiency anemia secondary to blood loss (chronic): Secondary | ICD-10-CM | POA: Diagnosis not present

## 2022-10-27 DIAGNOSIS — Z86718 Personal history of other venous thrombosis and embolism: Secondary | ICD-10-CM | POA: Diagnosis not present

## 2022-10-27 LAB — CBC WITH DIFFERENTIAL (CANCER CENTER ONLY)
Abs Immature Granulocytes: 0.01 10*3/uL (ref 0.00–0.07)
Basophils Absolute: 0 10*3/uL (ref 0.0–0.1)
Basophils Relative: 1 %
Eosinophils Absolute: 0.2 10*3/uL (ref 0.0–0.5)
Eosinophils Relative: 3 %
HCT: 31.2 % — ABNORMAL LOW (ref 36.0–46.0)
Hemoglobin: 10.2 g/dL — ABNORMAL LOW (ref 12.0–15.0)
Immature Granulocytes: 0 %
Lymphocytes Relative: 16 %
Lymphs Abs: 0.8 10*3/uL (ref 0.7–4.0)
MCH: 28.6 pg (ref 26.0–34.0)
MCHC: 32.7 g/dL (ref 30.0–36.0)
MCV: 87.4 fL (ref 80.0–100.0)
Monocytes Absolute: 0.6 10*3/uL (ref 0.1–1.0)
Monocytes Relative: 12 %
Neutro Abs: 3.3 10*3/uL (ref 1.7–7.7)
Neutrophils Relative %: 68 %
Platelet Count: 160 10*3/uL (ref 150–400)
RBC: 3.57 MIL/uL — ABNORMAL LOW (ref 3.87–5.11)
RDW: 16.5 % — ABNORMAL HIGH (ref 11.5–15.5)
WBC Count: 4.9 10*3/uL (ref 4.0–10.5)
nRBC: 0 % (ref 0.0–0.2)

## 2022-10-27 LAB — CMP (CANCER CENTER ONLY)
ALT: 8 U/L (ref 0–44)
AST: 13 U/L — ABNORMAL LOW (ref 15–41)
Albumin: 3.6 g/dL (ref 3.5–5.0)
Alkaline Phosphatase: 77 U/L (ref 38–126)
Anion gap: 8 (ref 5–15)
BUN: 26 mg/dL — ABNORMAL HIGH (ref 8–23)
CO2: 23 mmol/L (ref 22–32)
Calcium: 9 mg/dL (ref 8.9–10.3)
Chloride: 108 mmol/L (ref 98–111)
Creatinine: 0.75 mg/dL (ref 0.44–1.00)
GFR, Estimated: >60 mL/min (ref >60)
Glucose, Bld: 83 mg/dL (ref 70–99)
Potassium: 4.3 mmol/L (ref 3.5–5.1)
Sodium: 139 mmol/L (ref 135–145)
Total Bilirubin: 0.4 mg/dL (ref 0.3–1.2)
Total Protein: 6.4 g/dL — ABNORMAL LOW (ref 6.5–8.1)

## 2022-10-27 LAB — MAGNESIUM: Magnesium: 1.5 mg/dL — ABNORMAL LOW (ref 1.7–2.4)

## 2022-10-27 LAB — CEA (ACCESS): CEA (CHCC): 13.98 ng/mL — ABNORMAL HIGH (ref 0.00–5.00)

## 2022-10-27 MED ORDER — MAGNESIUM SULFATE 2 GM/50ML IV SOLN
2.0000 g | Freq: Once | INTRAVENOUS | Status: AC
  Administered 2022-10-27: 2 g via INTRAVENOUS
  Filled 2022-10-27: qty 50

## 2022-10-27 MED ORDER — SODIUM CHLORIDE 0.9 % IV SOLN: Freq: Once | INTRAVENOUS | Status: AC

## 2022-10-27 MED ORDER — HEPARIN SOD (PORK) LOCK FLUSH 100 UNIT/ML IV SOLN
500.0000 [IU] | Freq: Once | INTRAVENOUS | Status: AC | PRN
  Administered 2022-10-27: 500 [IU]

## 2022-10-27 MED ORDER — SODIUM CHLORIDE 0.9 % IV SOLN
6.0000 mg/kg | Freq: Once | INTRAVENOUS | Status: AC
  Administered 2022-10-27: 400 mg via INTRAVENOUS
  Filled 2022-10-27: qty 20

## 2022-10-27 MED ORDER — SODIUM CHLORIDE 0.9% FLUSH
10.0000 mL | INTRAVENOUS | Status: DC | PRN
  Administered 2022-10-27: 10 mL

## 2022-10-27 MED ORDER — FERROUS SULFATE 325 (65 FE) MG PO TABS: ORAL_TABLET | ORAL | 1 refills | Status: DC

## 2022-10-27 NOTE — Progress Notes (Signed)
[facility] OFFICE PROGRESS NOTE   Diagnosis: Colon cancer  INTERVAL HISTORY:   Stacy Burns returns as scheduled.  She continues encorafenib and panitumumab.  She has mild intermittent nosebleeding.  No new complaint.  She continues to have pain at the left shoulder.  The right shoulder pain has resolved.  Objective:  Vital signs in last 24 hours:  Blood pressure 137/63, pulse 83, temperature 98.2 F (36.8 C), temperature source Oral, resp. rate 18, height 5\' 7"  (1.702 m), weight 141 lb 9.6 oz (64.2 kg), SpO2 100 %.    HEENT: No thrush or ulcers Resp: Lungs clear bilaterally Cardio: Regular rate and rhythm GI: Nontender, no mass, no hepatosplenomegaly Vascular: No leg edema  Skin: Mild acne type rash over the face, 1 area of paronychia at the base of a toenail  Portacath/PICC-without erythema  Lab Results:  Lab Results  Component Value Date   WBC 4.9 10/27/2022   HGB 10.2 (L) 10/27/2022   HCT 31.2 (L) 10/27/2022   MCV 87.4 10/27/2022   PLT 160 10/27/2022   NEUTROABS 3.3 10/27/2022    CMP  Lab Results  Component Value Date   NA 139 10/27/2022   K 4.3 10/27/2022   CL 108 10/27/2022   CO2 23 10/27/2022   GLUCOSE 83 10/27/2022   BUN 26 (H) 10/27/2022   CREATININE 0.75 10/27/2022   CALCIUM 9.0 10/27/2022   PROT 6.4 (L) 10/27/2022   ALBUMIN 3.6 10/27/2022   AST 13 (L) 10/27/2022   ALT 8 10/27/2022   ALKPHOS 77 10/27/2022   BILITOT 0.4 10/27/2022   GFRNONAA >60 10/27/2022   GFRAA >60 05/06/2020    Lab Results  Component Value Date   CEA1 54.57 (H) 12/30/2020   CEA 13.98 (H) 10/27/2022     Medications: I have reviewed the patient's current medications.   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  Cirrhosis.  Innumerable liver lesions. Colonoscopy 12/18/2019-ulcerated partially obstructing large mass in the mid ascending  colon.  Biopsy invasive adenocarcinoma; preserved expression major MMR proteins Upper endoscopy 12/18/2019-normal esophagus, stomach, examined duodenum CEA 12/19/2019-8,923 Biopsy liver lesion 12/23/2019-adenocarcinoma Foundation 1-K-ras wild-type; tumor mutation burden greater than or equal to 10; microsatellite stable; BRAF V600E Cycle 1 FOLFOX 01/01/2020 Cycle 2 FOLFOX 01/15/2020  Cycle 3 FOLFOX 01/29/2020, Udenyca added Cycle 4 FOLFOX 02/12/2020, Udenyca held Cycle 5 FOLFOX 02/26/2020, Udenyca Cycle 6 FOLFOX 03/11/2020, Udenyca held CT abdomen/pelvis 03/16/2020-stable diffuse liver lesions, stable strictured appearing ascending colon, possible pericolonic implant, vague left lower lobe nodule Cycle 1 FOLFIRI/Avastin 04/06/2020 Cycle 2 FOLFIRI/Avastin 04/21/2020 Cycle 3 FOLFIRI/Avastin 05/06/2020 Cycle 4 FOLFIRI/Avastin 05/20/2020 Cycle 5 FOLFIRI/Avastin 06/03/2020 CTs 06/15/2020-mild to moderate improvement in hepatic metastasis.  No new or progressive disease.  Narrowing within the proximal ascending colon with upstream mild cecal and terminal ileum dilatation, similar. Cycle 6 FOLFIRI/Avastin 06/17/2020 Cycle 7 FOLFIRI/Avastin 07/01/2020 Cycle 8 FOLFIRI/Avastin 07/15/2020 Cycle 9 FOLFIRI/Avastin 07/29/2020-5-FU bolus eliminated, 5-FU infusion and irinotecan dose reduced Cycle 10 FOLFIRI/Avastin 08/12/2020 CTs 08/23/2020- mild residual wall thickening involving the cecum/ascending colon.  Numerous liver metastases improved. Cycle 11 FOLFIRI/Avastin 08/25/2020 Cycle 12 FOLFIRI/Avastin 09/16/2020 Cycle 13 FOLFIRI/Avastin 10/07/2020 Cycle 14 FOLFIRI/Avastin 10/28/2020 Cycle 15 FOLFIRI/Avastin 11/18/2020 CT abdomen/pelvis 12/06/2020-mild improvement in multifocal hepatic metastases, wall thickening at the ascending colon with pericolonic inflammatory changes Cycle 16 FOLFIRI/Avastin 12/09/2020 Cycle 17 FOLFIRI/Avastin 12/30/2020 Cycle 18 FOLFIRI/Avastin 01/20/2021 Cycle 19 FOLFIRI/Avastin 02/10/2021 Cycle 20  FOLFIRI/Avastin 03/03/2021 Cycle 21 FOLFIRI/Avastin 03/24/2021  Cycle 22 FOLFIRI/Avastin 04/14/2021 CTs 05/04/2021-slight improvement in hepatic metastatic disease. Cycle 23 FOLFIRI/Avastin 05/05/2021 Cycle 24 FOLFIRI/Avastin 05/25/2021 Cycle 25 FOLFIRI/Avastin 06/15/2021 Cycle 26 FOLFIRI/Avastin 07/13/2021 Cycle 27 FOLFIRI/Avastin 08/02/2021 CTs 08/30/2021-majority of liver lesions are unchanged in size, 1 lesion adjacent to the gallbladder has increased, no adenopathy, persistent pericolonic stranding and peritoneal thickening at the cecum Progressive rise in CEA 08/31/2021-treatment changed to Xeloda/bevacizumab, began [place] 09/22/2021 CEA stable 11/03/2021 Xeloda/bevacizumab continued CTs 01/10/2022-increase in size of some of the liver lesions, others are stable, new tiny right lower lobe nodule, increased dilation of distal small bowel with increased wall thickening at the terminal ileum and ileocecal valve with 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, stable bilateral paracolic gutter peritoneal thickening Panitumumab/ Encorafenib 01/12/2022 (encorafenib 01/19/2022) Panitumumab 02/02/2022 Panitumumab 02/20/2022 Panitumumab 03/06/2022 Panitumumab 03/20/2022 Panitumumab 04/03/2022 CTs 04/14/2022-decrease size of liver metastases, new borderline subcarinal adenopathy, improved peritoneal thickening, cirrhosis or pseudocirrhosis, persistent dilated cecum Encorafenib and Panitumumab continued Panitumumab 04/18/2022 Panitumumab 05/05/2022 Panitumumab 05/19/2022 Panitumumab 06/02/2022 Panitumumab 06/16/2022, 4g IV mag and begin oral mag po once daily  Panitumumab 06/30/2022 Panitumumab 07/14/2022, 2 g IV magnesium, increase oral magnesium to 400 mg twice daily Panitumumab 07/28/2022, 2 g IV magnesium CTs 07/28/2022-no change in hepatic metastases, no new signs of metastatic disease, persistent mural thickening at the terminal ileum/cecum mild splenomegaly unchanged Panitumumab 08/18/2022 Panitumumab  09/01/2022 Panitumumab 09/15/2022 Panitumumab 09/29/2022 Panitumumab 10/13/2022 Panitumumab 10/27/2022 Anemia secondary to GI blood loss, on oral iron BID  Cirrhosis 12/26/2019-hospital admission for right lower extremity DVT and GI bleed, treated with heparin anticoagulation beginning 12/26/2019, converted to Lovenox at discharge 12/29/2019 Lovenox reduced to 40 mg daily 02/10/2021 secondary to bruising and nosebleeding History of low-grade fever-likely "tumor" fever COVID-19 infection January 2021        Disposition: Stacy Burns appears stable.  There is no clinical evidence of disease progression.  She will continue panitumumab and encorafenib.  She will undergo a restaging CT prior to an office visit in 2 weeks.  She will receive IV magnesium supplementation today.  [name], MD  10/27/2022  9:20 AM

## 2022-10-27 NOTE — Patient Instructions (Signed)
[facility]   Discharge Instructions: Thank you for choosing [facility] to provide your oncology and hematology care.   If you have a lab appointment with the Cancer Center, please go directly to the Cancer Center and check in at the registration area.   Wear comfortable clothing and clothing appropriate for easy access to any Portacath or PICC line.   We strive to give you quality time with your provider. You may need to reschedule your appointment if you arrive late (15 or more minutes).  Arriving late affects you and other patients whose appointments are after yours.  Also, if you miss three or more appointments without notifying the office, you may be dismissed from the clinic at the provider's discretion.      For prescription refill requests, have your pharmacy contact our office and allow 72 hours for refills to be completed.    Today you received the following chemotherapy and/or immunotherapy agents Panitumumab (VECTIBIX).      To help prevent nausea and vomiting after your treatment, we encourage you to take your nausea medication as directed.  BELOW ARE SYMPTOMS THAT SHOULD BE REPORTED IMMEDIATELY: *FEVER GREATER THAN 100.4 F (38 C) OR HIGHER *CHILLS OR SWEATING *NAUSEA AND VOMITING THAT IS NOT CONTROLLED WITH YOUR NAUSEA MEDICATION *UNUSUAL SHORTNESS OF BREATH *UNUSUAL BRUISING OR BLEEDING *URINARY PROBLEMS (pain or burning when urinating, or frequent urination) *BOWEL PROBLEMS (unusual diarrhea, constipation, pain near the anus) TENDERNESS IN MOUTH AND THROAT WITH OR WITHOUT PRESENCE OF ULCERS (sore throat, sores in mouth, or a toothache) UNUSUAL RASH, SWELLING OR PAIN  UNUSUAL VAGINAL DISCHARGE OR ITCHING   Items with * indicate a potential emergency and should be followed up as soon as possible or go to the Emergency Department if any problems should occur.  Please show the CHEMOTHERAPY ALERT CARD or IMMUNOTHERAPY ALERT  CARD at check-in to the Emergency Department and triage nurse.  Should you have questions after your visit or need to cancel or reschedule your appointment, please contact [facility]  Dept: [phone]  and follow the prompts.  Office hours are 8:00 a.m. to 4:30 p.m. Monday - Friday. Please note that voicemails left after 4:00 p.m. may not be returned until the following business day.  We are closed weekends and major holidays. You have access to a nurse at all times for urgent questions. Please call the main number to the clinic Dept: [phone] and follow the prompts.   For any non-urgent questions, you may also contact your provider using MyChart. We now offer e-Visits for anyone 18 and older to request care online for non-urgent symptoms. For details visit [email or web address].   Also download the MyChart app! Go to the app store, search "MyChart", open the app, select [facility], and log in with your MyChart username and password.  Panitumumab Injection What is this medication? PANITUMUMAB (pan i TOOM ue mab) treats colorectal cancer. It works by blocking a protein that causes cancer cells to grow and multiply. This helps to slow or stop the spread of cancer cells. It is a monoclonal antibody. This medicine may be used for other purposes; ask your health care provider or pharmacist if you have questions. COMMON BRAND NAME(S): Vectibix What should I tell my care team before I take this medication? They need to know if you have any of these conditions: Eye disease Low levels of magnesium in the blood Lung disease An unusual or allergic   reaction to panitumumab, other medications, foods, dyes, or preservatives Pregnant or trying to get pregnant Breast-feeding How should I use this medication? This medication is injected into a vein. It is given by your care team in a hospital or clinic setting. Talk to your care team about the use of this medication  in children. Special care may be needed. Overdosage: If you think you have taken too much of this medicine contact a poison control center or emergency room at once. NOTE: This medicine is only for you. Do not share this medicine with others. What if I miss a dose? Keep appointments for follow-up doses. It is important not to miss your dose. Call your care team if you are unable to keep an appointment. What may interact with this medication? Bevacizumab This list may not describe all possible interactions. Give your health care provider a list of all the medicines, herbs, non-prescription drugs, or dietary supplements you use. Also tell them if you smoke, drink alcohol, or use illegal drugs. Some items may interact with your medicine. What should I watch for while using this medication? Your condition will be monitored carefully while you are receiving this medication. This medication may make you feel generally unwell. This is not uncommon as chemotherapy can affect healthy cells as well as cancer cells. Report any side effects. Continue your course of treatment even though you feel ill unless your care team tells you to stop. This medication can make you more sensitive to the sun. Keep out of the sun while receiving this medication and for 2 months after stopping therapy. If you cannot avoid being in the sun, wear protective clothing and sunscreen. Do not use sun lamps, tanning beds, or tanning booths. Check with your care team if you have severe diarrhea, nausea, and vomiting or if you sweat a lot. The loss of too much body fluid may make it dangerous for you to take this medication. This medication may cause serious skin reactions. They can happen weeks to months after starting the medication. Contact your care team right away if you notice fevers or flu-like symptoms with a rash. The rash may be red or purple and then turn into blisters or peeling of the skin. You may also notice a red rash with  swelling of the face, lips, or lymph nodes in your neck or under your arms. Talk to your care team if you may be pregnant. Serious birth defects can occur if you take this medication during pregnancy and for 2 months after the last dose. Contraception is recommended while taking this medication and for 2 months after the last dose. Your care team can help you find the option that works for you. Do not breastfeed while taking this medication and for 2 months after the last dose. This medication may cause infertility. Talk to your care team if you are concerned about your fertility. What side effects may I notice from receiving this medication? Side effects that you should report to your care team as soon as possible: Allergic reactions--skin rash, itching, hives, swelling of the face, lips, tongue, or throat Dry cough, shortness of breath or trouble breathing Eye pain, redness, irritation, or discharge with blurry or decreased vision Infusion reactions--chest pain, shortness of breath or trouble breathing, feeling faint or lightheaded Low magnesium level--muscle pain or cramps, unusual weakness or fatigue, fast or irregular heartbeat, tremors Low potassium level--muscle pain or cramps, unusual weakness or fatigue, fast or irregular heartbeat, constipation Redness, blistering, peeling, or loosening   of the skin, including inside the mouth Skin reactions on sun-exposed areas Side effects that usually do not require medical attention (report to your care team if they continue or are bothersome): Change in nail shape, thickness, or color Diarrhea Dry skin Fatigue Nausea Vomiting This list may not describe all possible side effects. Call your doctor for medical advice about side effects. You may report side effects to FDA at 1-800-FDA-1088. Where should I keep my medication? This medication is given in a hospital or clinic. It will not be stored at home. NOTE: This sheet is a summary. It may not  cover all possible information. If you have questions about this medicine, talk to your doctor, pharmacist, or health care provider.  2023 Elsevier/Gold Standard (2021-12-12 00:00:00)  Magnesium Sulfate Injection What is this medication? MAGNESIUM SULFATE (mag NEE zee um SUL fate) prevents and treats low levels of magnesium in your body. It may also be used to prevent and treat seizures during pregnancy in people with high blood pressure disorders, such as preeclampsia or eclampsia. Magnesium plays an important role in maintaining the health of your muscles and nervous system. This medicine may be used for other purposes; ask your health care provider or pharmacist if you have questions. What should I tell my care team before I take this medication? They need to know if you have any of these conditions: Heart disease History of irregular heart beat Kidney disease An unusual or allergic reaction to magnesium sulfate, medications, foods, dyes, or preservatives Pregnant or trying to get pregnant Breast-feeding How should I use this medication? This medication is for infusion into a vein. It is given in a hospital or clinic setting. Talk to your care team about the use of this medication in children. While this medication may be prescribed for selected conditions, precautions do apply. Overdosage: If you think you have taken too much of this medicine contact a poison control center or emergency room at once. NOTE: This medicine is only for you. Do not share this medicine with others. What if I miss a dose? This does not apply. What may interact with this medication? Certain medications for anxiety or sleep Certain medications for seizures, such phenobarbital Digoxin Medications that relax muscles for surgery Narcotic medications for pain This list may not describe all possible interactions. Give your health care provider a list of all the medicines, herbs, non-prescription drugs, or dietary  supplements you use. Also tell them if you smoke, drink alcohol, or use illegal drugs. Some items may interact with your medicine. What should I watch for while using this medication? Your condition will be monitored carefully while you are receiving this medication. You may need blood work done while you are receiving this medication. What side effects may I notice from receiving this medication? Side effects that you should report to your care team as soon as possible: Allergic reactions--skin rash, itching, hives, swelling of the face, lips, tongue, or throat High magnesium level--confusion, drowsiness, facial flushing, redness, sweating, muscle weakness, fast or irregular heartbeat, trouble breathing Low blood pressure--dizziness, feeling faint or lightheaded, blurry vision Side effects that usually do not require medical attention (report to your care team if they continue or are bothersome): Headache Nausea This list may not describe all possible side effects. Call your doctor for medical advice about side effects. You may report side effects to FDA at 1-800-FDA-1088. Where should I keep my medication? This medication is given in a hospital or clinic and will not be stored   at home. NOTE: This sheet is a summary. It may not cover all possible information. If you have questions about this medicine, talk to your doctor, pharmacist, or health care provider.  2023 Elsevier/Gold Standard (2012-12-06 00:00:00)   

## 2022-10-27 NOTE — Progress Notes (Signed)
Patient seen by Dr. Benay Spice today  Vitals are within treatment parameters.  Labs reviewed by Dr. Benay Spice and are not all within treatment parameters. Mg+ 1.5  Per physician team, patient is ready for treatment. Please note that modifications are being made to the treatment plan including Adding 2 grams IV Mg+ today

## 2022-11-01 ENCOUNTER — Encounter: Payer: Self-pay | Admitting: Oncology

## 2022-11-01 ENCOUNTER — Other Ambulatory Visit: Payer: Self-pay | Admitting: Hematology and Oncology

## 2022-11-01 NOTE — Progress Notes (Signed)
OFF PATHWAY REGIMEN - Colorectal  No Change  Continue With Treatment as Ordered.  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

## 2022-11-02 IMAGING — CT CT ABD-PELV W/ CM
2 of 5 series · 15 of 46 positions shown, 17 images · IV contrast (APPLIED)
Comparison: 08/23/2020

CLINICAL DATA: Follow-up colon cancer, liver metastases, on
chemotherapy

EXAM:
CT ABDOMEN AND PELVIS WITH CONTRAST
TECHNIQUE: Multidetector CT imaging of the abdomen and pelvis was performed
using the standard protocol following bolus administration of
intravenous contrast.
CONTRAST:  75mL OMNIPAQUE IOHEXOL 300 MG/ML  SOLN

[Series 2: abd pel w · axial · 0.67mm/px · z∈[+1084,+1474]mm · 12 of 88 slices shown, 14 images]
[im 5/88  soft-tissue]
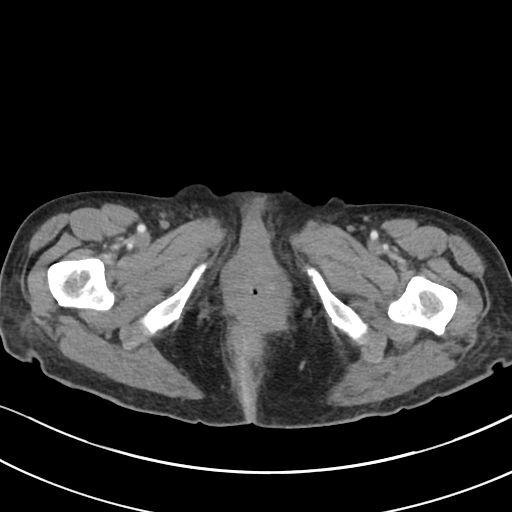
[im 5/88  bone]
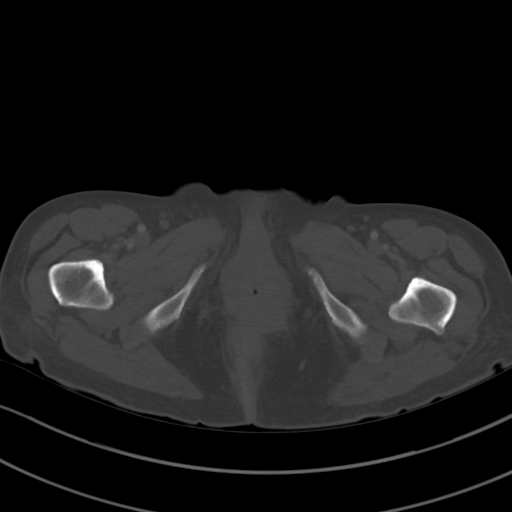
[im 14/88  soft-tissue]
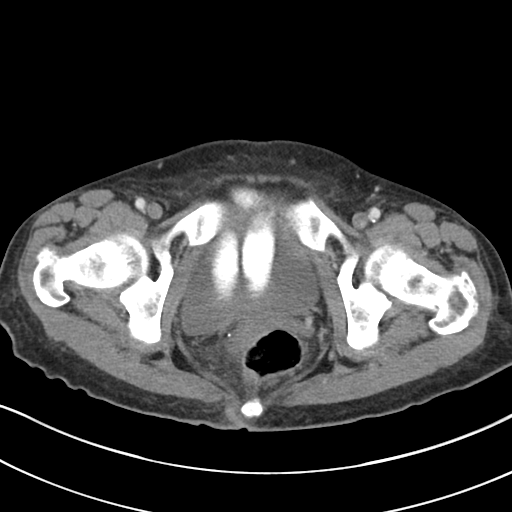
[im 19/88  soft-tissue]
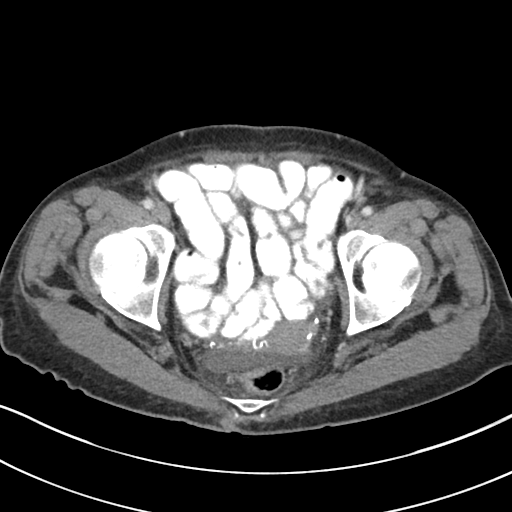
[im 28/88  soft-tissue]
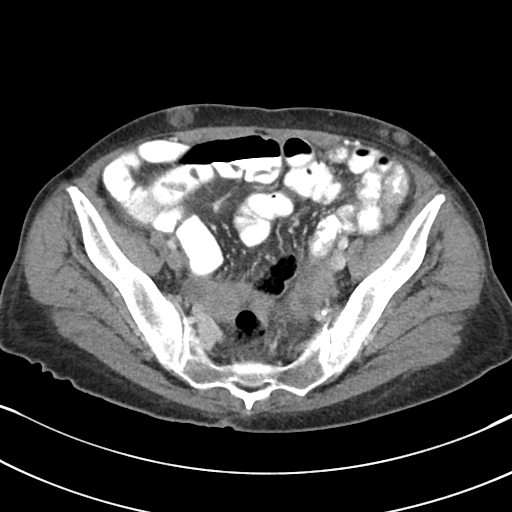
[im 33/88  soft-tissue]
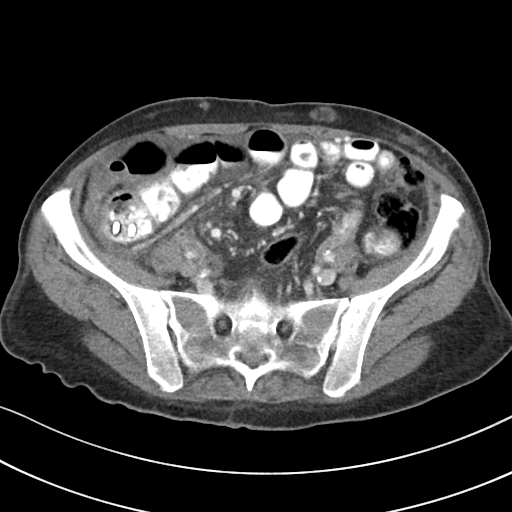
[im 42/88  soft-tissue]
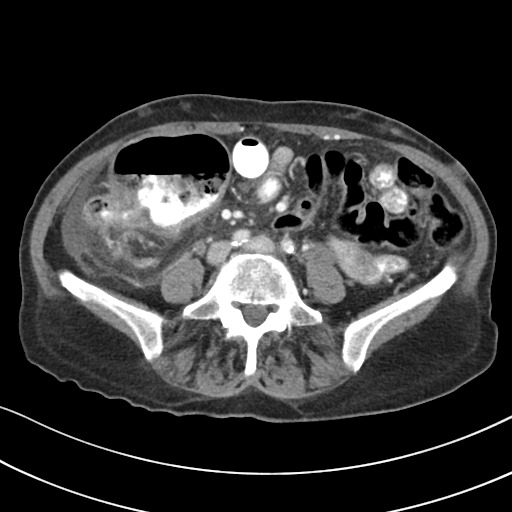
[im 46/88  soft-tissue]
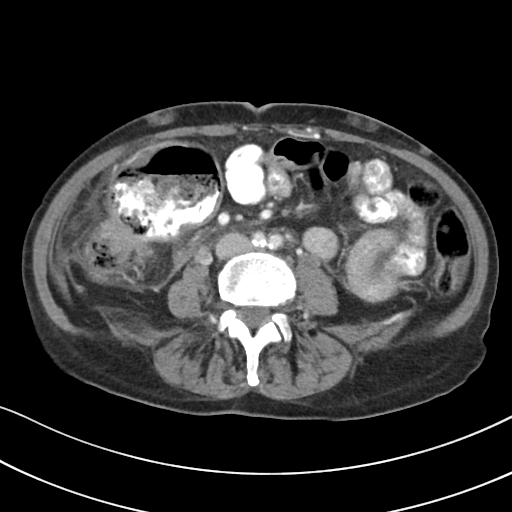
[im 55/88  soft-tissue]
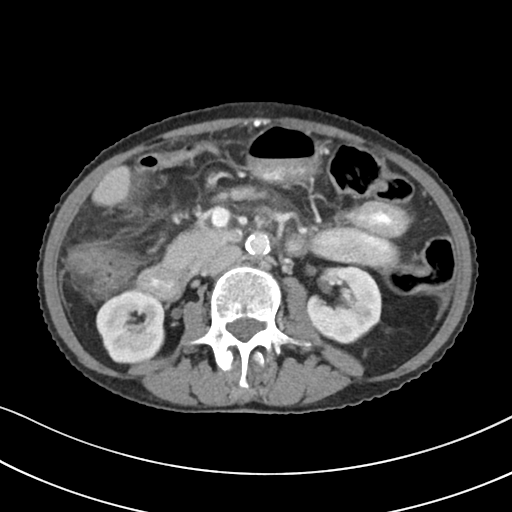
[im 60/88  soft-tissue]
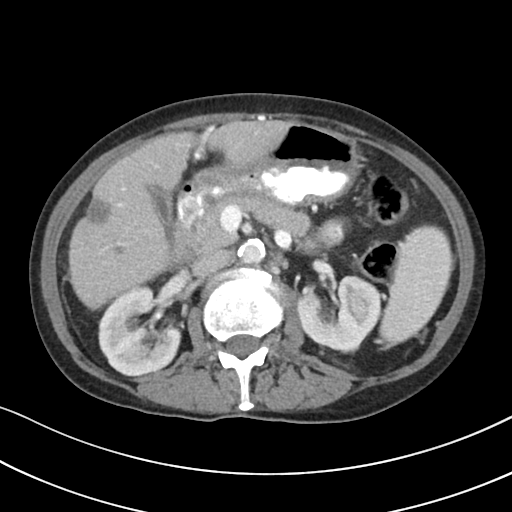
[im 60/88  bone]
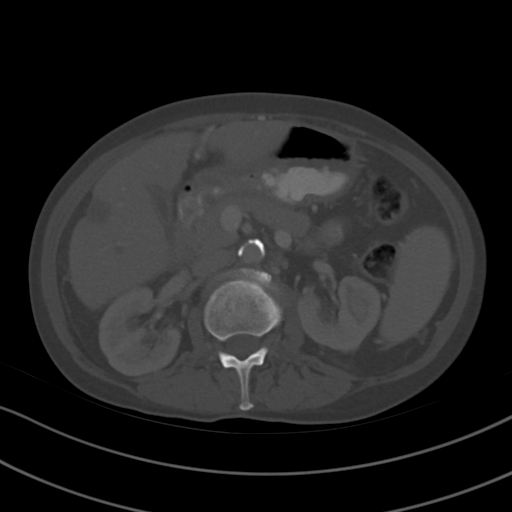
[im 69/88  soft-tissue]
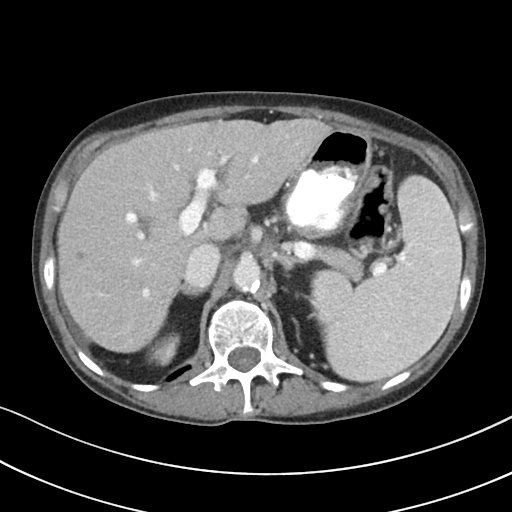
[im 74/88  soft-tissue]
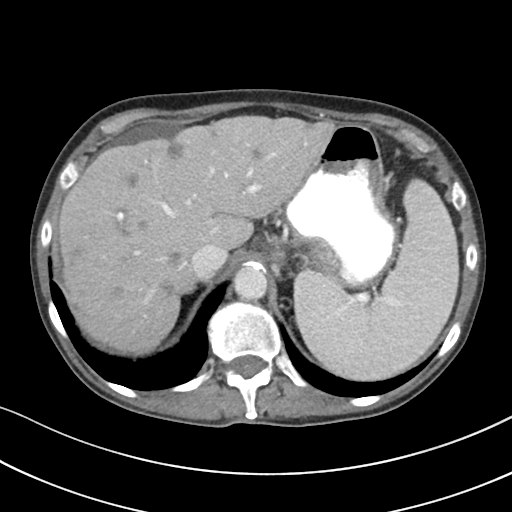
[im 83/88  soft-tissue]
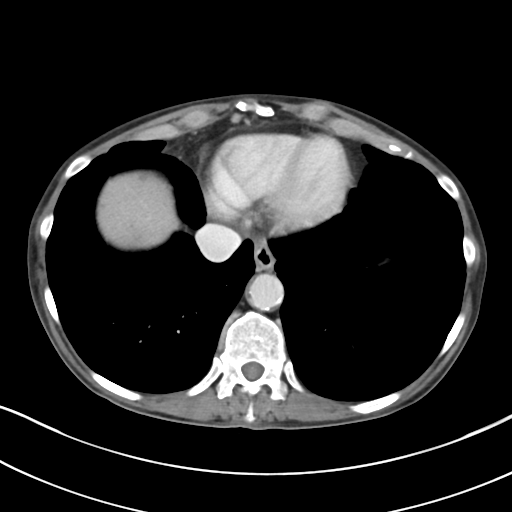

[Series 5: coronal · coronal · 0.73mm/px · 3 of 88 slices shown]
[im 30/88  soft-tissue]
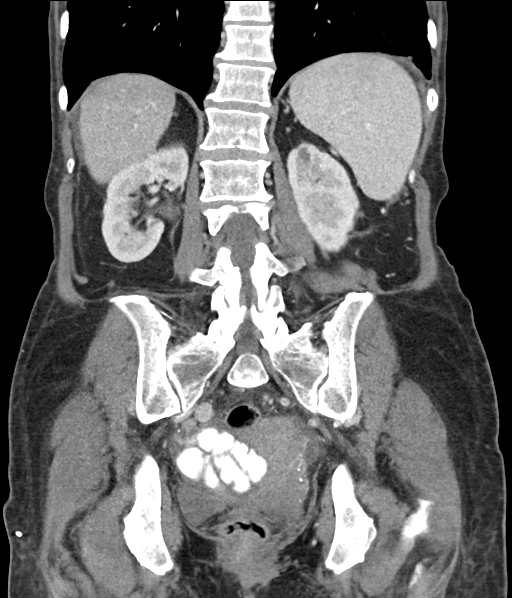
[im 39/88  soft-tissue]
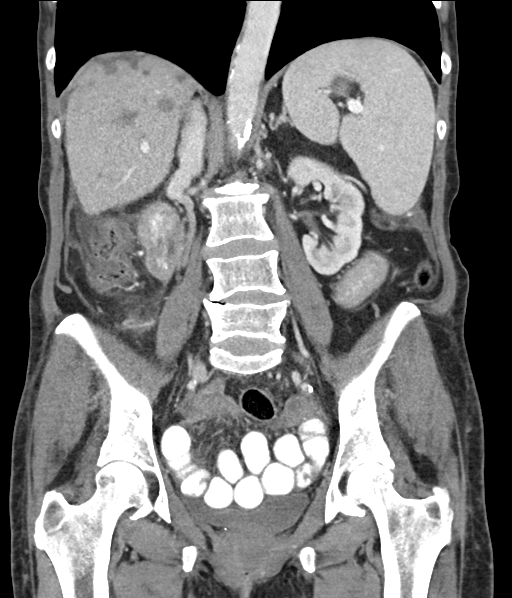
[im 49/88  soft-tissue]
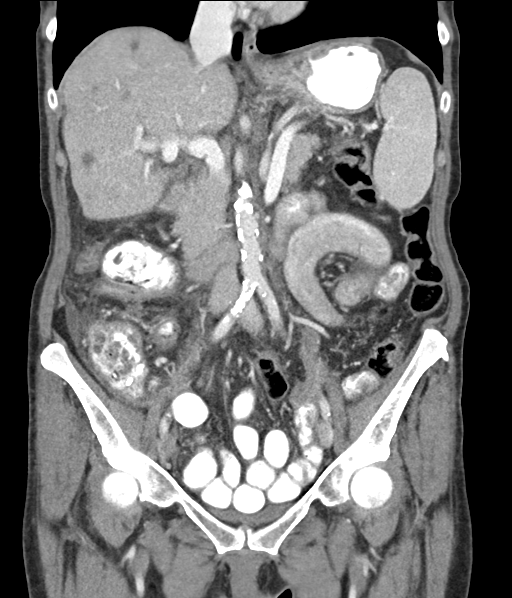

[15 of 46 positions shown; findings below may reference images not displayed]

FINDINGS: Lower chest: 8 mm calcified nodule in the left lower lobe (series
4/image 1), previously 5 mm in December 2019.

Hepatobiliary: Multifocal hepatic metastases in both lobes.
Pseudocirrhotic appearance. Index lesions include:

--1.8 cm lesion in segment 4A (series 2/image 10), previously 2.0 cm

--2.1 cm lesion inferiorly in segment 5 (series 2/image 28),
previously 2.2 cm

--1.1 cm lesion in segment 3 (series 2/image 28), previously 1.4 cm

Gallbladder is unremarkable. No intrahepatic or extrahepatic ductal
dilatation.

Pancreas: Within normal limits.

Spleen: Splenomegaly, measuring 14.2 cm in maximal axial dimension
(series 2/image 18).

Adrenals/Urinary Tract: Adrenal glands are within normal limits.

Kidneys are within normal limits.  No hydronephrosis.

Bladder is within normal limits.

Stomach/Bowel: Stomach is within normal limits.

Wall thickening with pericolonic inflammatory changes involving the
ascending colon (coronal image 41), less focal and more generalized
when compared to the prior CT. This appearance favors
infectious/inflammatory colitis or post treatment changes, although
residual underlying colonic neoplasm is possible (series 2/image
43).

Distally, the proximal transverse colon is decompressed (series
2/image 37). Proximally, the terminal ileum (series 2/image 58) and
cecum (series 2/image 49) are distended/air-filled. The base of the
appendix is are also distended and fluid-filled (series 2/image 87).
This suggests some degree of stricture with ileus or less likely
functional obstruction.

However, distally, the distal transverse colon, descending colon,
and rectosigmoid colon are not decompressed and contain gas, which
argues against colonic obstruction.

Vascular/Lymphatic: No evidence of abdominal aortic aneurysm.

Atherosclerotic calcifications of the abdominal aorta and branch
vessels.

No suspicious abdominopelvic lymphadenopathy.

Reproductive: Heterogeneous uterus with suspected uterine fibroids.

Bilateral ovaries are within normal limits.

Other: Small volume pelvic ascites.

No frank peritoneal disease/omental caking.

No free air to suggest macroscopic perforation.

Musculoskeletal: Subcutaneous nodularity along the anterior
abdominal wall, suggesting injection sites.

Mild degenerative changes at L2-3.
IMPRESSION: Multifocal hepatic metastases, favored to be mildly improved.

Wall thickening involving the ascending colon, less focal with
superimposed pericolonic inflammatory changes, favoring post
treatment changes and/or infectious/inflammatory colitis. Underlying
residual colonic neoplasm remains possible.

Secondary adynamic ileus is favored, although some degree of
stricture with functional colonic obstruction is difficult to
exclude.

## 2022-11-03 ENCOUNTER — Ambulatory Visit (HOSPITAL_BASED_OUTPATIENT_CLINIC_OR_DEPARTMENT_OTHER)
Admission: RE | Admit: 2022-11-03 | Discharge: 2022-11-03 | Disposition: A | Discharge status: Home / Self Care | Payer: BC Managed Care – PPO | Source: Ambulatory Visit | Attending: Nurse Practitioner | Admitting: Nurse Practitioner

## 2022-11-03 ENCOUNTER — Inpatient Hospital Stay: Payer: BC Managed Care – PPO

## 2022-11-03 DIAGNOSIS — K7460 Unspecified cirrhosis of liver: Secondary | ICD-10-CM | POA: Diagnosis not present

## 2022-11-03 DIAGNOSIS — J439 Emphysema, unspecified: Secondary | ICD-10-CM | POA: Diagnosis not present

## 2022-11-03 DIAGNOSIS — C182 Malignant neoplasm of ascending colon: Secondary | ICD-10-CM

## 2022-11-03 MED ORDER — IOHEXOL 300 MG/ML  SOLN
100.0000 mL | Freq: Once | INTRAMUSCULAR | Status: AC | PRN
  Administered 2022-11-03: 80 mL via INTRAVENOUS

## 2022-11-05 ENCOUNTER — Other Ambulatory Visit: Payer: Self-pay | Admitting: Oncology

## 2022-11-05 DIAGNOSIS — C182 Malignant neoplasm of ascending colon: Secondary | ICD-10-CM

## 2022-11-10 ENCOUNTER — Other Ambulatory Visit: Payer: Self-pay | Admitting: *Deleted

## 2022-11-10 ENCOUNTER — Encounter: Payer: Self-pay | Admitting: Oncology

## 2022-11-10 ENCOUNTER — Inpatient Hospital Stay: Payer: BC Managed Care – PPO

## 2022-11-10 ENCOUNTER — Inpatient Hospital Stay: Payer: BC Managed Care – PPO | Admitting: Oncology

## 2022-11-10 ENCOUNTER — Encounter: Payer: Self-pay | Admitting: *Deleted

## 2022-11-10 ENCOUNTER — Other Ambulatory Visit: Payer: Self-pay | Admitting: Oncology

## 2022-11-10 VITALS — BP 141/66 | HR 73 | Temp 98.1°F | Resp 18 | Ht 67.0 in | Wt 140.0 lb

## 2022-11-10 DIAGNOSIS — Z5112 Encounter for antineoplastic immunotherapy: Secondary | ICD-10-CM | POA: Diagnosis not present

## 2022-11-10 DIAGNOSIS — C182 Malignant neoplasm of ascending colon: Secondary | ICD-10-CM

## 2022-11-10 DIAGNOSIS — C787 Secondary malignant neoplasm of liver and intrahepatic bile duct: Secondary | ICD-10-CM | POA: Diagnosis not present

## 2022-11-10 DIAGNOSIS — K7460 Unspecified cirrhosis of liver: Secondary | ICD-10-CM | POA: Diagnosis not present

## 2022-11-10 DIAGNOSIS — D500 Iron deficiency anemia secondary to blood loss (chronic): Secondary | ICD-10-CM | POA: Diagnosis not present

## 2022-11-10 DIAGNOSIS — Z7901 Long term (current) use of anticoagulants: Secondary | ICD-10-CM | POA: Diagnosis not present

## 2022-11-10 DIAGNOSIS — Z86718 Personal history of other venous thrombosis and embolism: Secondary | ICD-10-CM | POA: Diagnosis not present

## 2022-11-10 LAB — CMP (CANCER CENTER ONLY)
ALT: 8 U/L (ref 0–44)
AST: 12 U/L — ABNORMAL LOW (ref 15–41)
Albumin: 3.7 g/dL (ref 3.5–5.0)
Alkaline Phosphatase: 91 U/L (ref 38–126)
Anion gap: 8 (ref 5–15)
BUN: 17 mg/dL (ref 8–23)
CO2: 24 mmol/L (ref 22–32)
Calcium: 9 mg/dL (ref 8.9–10.3)
Chloride: 106 mmol/L (ref 98–111)
Creatinine: 0.71 mg/dL (ref 0.44–1.00)
GFR, Estimated: >60 mL/min (ref >60)
Glucose, Bld: 85 mg/dL (ref 70–99)
Potassium: 4.2 mmol/L (ref 3.5–5.1)
Sodium: 138 mmol/L (ref 135–145)
Total Bilirubin: 0.3 mg/dL (ref 0.3–1.2)
Total Protein: 6.6 g/dL (ref 6.5–8.1)

## 2022-11-10 LAB — CBC WITH DIFFERENTIAL (CANCER CENTER ONLY)
Abs Immature Granulocytes: 0.02 10*3/uL (ref 0.00–0.07)
Basophils Absolute: 0.1 10*3/uL (ref 0.0–0.1)
Basophils Relative: 1 %
Eosinophils Absolute: 0.2 10*3/uL (ref 0.0–0.5)
Eosinophils Relative: 3 %
HCT: 31.9 % — ABNORMAL LOW (ref 36.0–46.0)
Hemoglobin: 10.4 g/dL — ABNORMAL LOW (ref 12.0–15.0)
Immature Granulocytes: 0 %
Lymphocytes Relative: 15 %
Lymphs Abs: 0.9 10*3/uL (ref 0.7–4.0)
MCH: 28.7 pg (ref 26.0–34.0)
MCHC: 32.6 g/dL (ref 30.0–36.0)
MCV: 87.9 fL (ref 80.0–100.0)
Monocytes Absolute: 0.7 10*3/uL (ref 0.1–1.0)
Monocytes Relative: 12 %
Neutro Abs: 4.1 10*3/uL (ref 1.7–7.7)
Neutrophils Relative %: 69 %
Platelet Count: 178 10*3/uL (ref 150–400)
RBC: 3.63 MIL/uL — ABNORMAL LOW (ref 3.87–5.11)
RDW: 16.2 % — ABNORMAL HIGH (ref 11.5–15.5)
WBC Count: 6 10*3/uL (ref 4.0–10.5)
nRBC: 0 % (ref 0.0–0.2)

## 2022-11-10 LAB — CEA (ACCESS): CEA (CHCC): 13.64 ng/mL — ABNORMAL HIGH (ref 0.00–5.00)

## 2022-11-10 LAB — MAGNESIUM: Magnesium: 1.4 mg/dL — ABNORMAL LOW (ref 1.7–2.4)

## 2022-11-10 MED ORDER — SODIUM CHLORIDE 0.9 % IV SOLN
6.0000 mg/kg | Freq: Once | INTRAVENOUS | Status: AC
  Administered 2022-11-10: 400 mg via INTRAVENOUS
  Filled 2022-11-10: qty 20

## 2022-11-10 MED ORDER — OXYCODONE-ACETAMINOPHEN 5-325 MG PO TABS: 1.0000 | ORAL_TABLET | Freq: Three times a day (TID) | ORAL | 0 refills | Status: DC | PRN

## 2022-11-10 MED ORDER — HEPARIN SOD (PORK) LOCK FLUSH 100 UNIT/ML IV SOLN
500.0000 [IU] | Freq: Once | INTRAVENOUS | Status: AC | PRN
  Administered 2022-11-10: 500 [IU]

## 2022-11-10 MED ORDER — MAGNESIUM SULFATE 2 GM/50ML IV SOLN
2.0000 g | Freq: Once | INTRAVENOUS | Status: AC
  Administered 2022-11-10: 2 g via INTRAVENOUS
  Filled 2022-11-10: qty 50

## 2022-11-10 MED ORDER — SODIUM CHLORIDE 0.9 % IV SOLN: Freq: Once | INTRAVENOUS | Status: AC

## 2022-11-10 MED ORDER — SODIUM CHLORIDE 0.9% FLUSH
10.0000 mL | INTRAVENOUS | Status: DC | PRN
  Administered 2022-11-10: 10 mL

## 2022-11-10 NOTE — Progress Notes (Signed)
Patient seen by Dr. Benay Spice today  Vitals are within treatment parameters.No intervention needed for BP 141/66  Labs reviewed by Dr. Benay Spice and are not all within treatment parameters. Mg+ 1.4 today-- OK to proceed  Per physician team, patient is ready for treatment. Please note that modifications are being made to the treatment plan including Will give 2 grams IV Mg+ today

## 2022-11-10 NOTE — Patient Instructions (Signed)
[facility]   Discharge Instructions: Thank you for choosing [facility] to provide your oncology and hematology care.   If you have a lab appointment with the Cancer Center, please go directly to the Cancer Center and check in at the registration area.   Wear comfortable clothing and clothing appropriate for easy access to any Portacath or PICC line.   We strive to give you quality time with your provider. You may need to reschedule your appointment if you arrive late (15 or more minutes).  Arriving late affects you and other patients whose appointments are after yours.  Also, if you miss three or more appointments without notifying the office, you may be dismissed from the clinic at the provider's discretion.      For prescription refill requests, have your pharmacy contact our office and allow 72 hours for refills to be completed.    Today you received the following chemotherapy and/or immunotherapy agents Panitumumab (VECTIBIX).      To help prevent nausea and vomiting after your treatment, we encourage you to take your nausea medication as directed.  BELOW ARE SYMPTOMS THAT SHOULD BE REPORTED IMMEDIATELY: *FEVER GREATER THAN 100.4 F (38 C) OR HIGHER *CHILLS OR SWEATING *NAUSEA AND VOMITING THAT IS NOT CONTROLLED WITH YOUR NAUSEA MEDICATION *UNUSUAL SHORTNESS OF BREATH *UNUSUAL BRUISING OR BLEEDING *URINARY PROBLEMS (pain or burning when urinating, or frequent urination) *BOWEL PROBLEMS (unusual diarrhea, constipation, pain near the anus) TENDERNESS IN MOUTH AND THROAT WITH OR WITHOUT PRESENCE OF ULCERS (sore throat, sores in mouth, or a toothache) UNUSUAL RASH, SWELLING OR PAIN  UNUSUAL VAGINAL DISCHARGE OR ITCHING   Items with * indicate a potential emergency and should be followed up as soon as possible or go to the Emergency Department if any problems should occur.  Please show the CHEMOTHERAPY ALERT CARD or IMMUNOTHERAPY ALERT  CARD at check-in to the Emergency Department and triage nurse.  Should you have questions after your visit or need to cancel or reschedule your appointment, please contact [facility]  Dept: [phone]  and follow the prompts.  Office hours are 8:00 a.m. to 4:30 p.m. Monday - Friday. Please note that voicemails left after 4:00 p.m. may not be returned until the following business day.  We are closed weekends and major holidays. You have access to a nurse at all times for urgent questions. Please call the main number to the clinic Dept: [phone] and follow the prompts.   For any non-urgent questions, you may also contact your provider using MyChart. We now offer e-Visits for anyone 18 and older to request care online for non-urgent symptoms. For details visit [email or web address].   Also download the MyChart app! Go to the app store, search "MyChart", open the app, select [facility], and log in with your MyChart username and password.  Panitumumab Injection What is this medication? PANITUMUMAB (pan i TOOM ue mab) treats colorectal cancer. It works by blocking a protein that causes cancer cells to grow and multiply. This helps to slow or stop the spread of cancer cells. It is a monoclonal antibody. This medicine may be used for other purposes; ask your health care provider or pharmacist if you have questions. COMMON BRAND NAME(S): Vectibix What should I tell my care team before I take this medication? They need to know if you have any of these conditions: Eye disease Low levels of magnesium in the blood Lung disease An unusual or allergic   reaction to panitumumab, other medications, foods, dyes, or preservatives Pregnant or trying to get pregnant Breast-feeding How should I use this medication? This medication is injected into a vein. It is given by your care team in a hospital or clinic setting. Talk to your care team about the use of this medication  in children. Special care may be needed. Overdosage: If you think you have taken too much of this medicine contact a poison control center or emergency room at once. NOTE: This medicine is only for you. Do not share this medicine with others. What if I miss a dose? Keep appointments for follow-up doses. It is important not to miss your dose. Call your care team if you are unable to keep an appointment. What may interact with this medication? Bevacizumab This list may not describe all possible interactions. Give your health care provider a list of all the medicines, herbs, non-prescription drugs, or dietary supplements you use. Also tell them if you smoke, drink alcohol, or use illegal drugs. Some items may interact with your medicine. What should I watch for while using this medication? Your condition will be monitored carefully while you are receiving this medication. This medication may make you feel generally unwell. This is not uncommon as chemotherapy can affect healthy cells as well as cancer cells. Report any side effects. Continue your course of treatment even though you feel ill unless your care team tells you to stop. This medication can make you more sensitive to the sun. Keep out of the sun while receiving this medication and for 2 months after stopping therapy. If you cannot avoid being in the sun, wear protective clothing and sunscreen. Do not use sun lamps, tanning beds, or tanning booths. Check with your care team if you have severe diarrhea, nausea, and vomiting or if you sweat a lot. The loss of too much body fluid may make it dangerous for you to take this medication. This medication may cause serious skin reactions. They can happen weeks to months after starting the medication. Contact your care team right away if you notice fevers or flu-like symptoms with a rash. The rash may be red or purple and then turn into blisters or peeling of the skin. You may also notice a red rash with  swelling of the face, lips, or lymph nodes in your neck or under your arms. Talk to your care team if you may be pregnant. Serious birth defects can occur if you take this medication during pregnancy and for 2 months after the last dose. Contraception is recommended while taking this medication and for 2 months after the last dose. Your care team can help you find the option that works for you. Do not breastfeed while taking this medication and for 2 months after the last dose. This medication may cause infertility. Talk to your care team if you are concerned about your fertility. What side effects may I notice from receiving this medication? Side effects that you should report to your care team as soon as possible: Allergic reactions--skin rash, itching, hives, swelling of the face, lips, tongue, or throat Dry cough, shortness of breath or trouble breathing Eye pain, redness, irritation, or discharge with blurry or decreased vision Infusion reactions--chest pain, shortness of breath or trouble breathing, feeling faint or lightheaded Low magnesium level--muscle pain or cramps, unusual weakness or fatigue, fast or irregular heartbeat, tremors Low potassium level--muscle pain or cramps, unusual weakness or fatigue, fast or irregular heartbeat, constipation Redness, blistering, peeling, or loosening   of the skin, including inside the mouth Skin reactions on sun-exposed areas Side effects that usually do not require medical attention (report to your care team if they continue or are bothersome): Change in nail shape, thickness, or color Diarrhea Dry skin Fatigue Nausea Vomiting This list may not describe all possible side effects. Call your doctor for medical advice about side effects. You may report side effects to FDA at 1-800-FDA-1088. Where should I keep my medication? This medication is given in a hospital or clinic. It will not be stored at home. NOTE: This sheet is a summary. It may not  cover all possible information. If you have questions about this medicine, talk to your doctor, pharmacist, or health care provider.  2023 Elsevier/Gold Standard (2021-12-12 00:00:00)  Magnesium Sulfate Injection What is this medication? MAGNESIUM SULFATE (mag NEE zee um SUL fate) prevents and treats low levels of magnesium in your body. It may also be used to prevent and treat seizures during pregnancy in people with high blood pressure disorders, such as preeclampsia or eclampsia. Magnesium plays an important role in maintaining the health of your muscles and nervous system. This medicine may be used for other purposes; ask your health care provider or pharmacist if you have questions. What should I tell my care team before I take this medication? They need to know if you have any of these conditions: Heart disease History of irregular heart beat Kidney disease An unusual or allergic reaction to magnesium sulfate, medications, foods, dyes, or preservatives Pregnant or trying to get pregnant Breast-feeding How should I use this medication? This medication is for infusion into a vein. It is given in a hospital or clinic setting. Talk to your care team about the use of this medication in children. While this medication may be prescribed for selected conditions, precautions do apply. Overdosage: If you think you have taken too much of this medicine contact a poison control center or emergency room at once. NOTE: This medicine is only for you. Do not share this medicine with others. What if I miss a dose? This does not apply. What may interact with this medication? Certain medications for anxiety or sleep Certain medications for seizures, such phenobarbital Digoxin Medications that relax muscles for surgery Narcotic medications for pain This list may not describe all possible interactions. Give your health care provider a list of all the medicines, herbs, non-prescription drugs, or dietary  supplements you use. Also tell them if you smoke, drink alcohol, or use illegal drugs. Some items may interact with your medicine. What should I watch for while using this medication? Your condition will be monitored carefully while you are receiving this medication. You may need blood work done while you are receiving this medication. What side effects may I notice from receiving this medication? Side effects that you should report to your care team as soon as possible: Allergic reactions--skin rash, itching, hives, swelling of the face, lips, tongue, or throat High magnesium level--confusion, drowsiness, facial flushing, redness, sweating, muscle weakness, fast or irregular heartbeat, trouble breathing Low blood pressure--dizziness, feeling faint or lightheaded, blurry vision Side effects that usually do not require medical attention (report to your care team if they continue or are bothersome): Headache Nausea This list may not describe all possible side effects. Call your doctor for medical advice about side effects. You may report side effects to FDA at 1-800-FDA-1088. Where should I keep my medication? This medication is given in a hospital or clinic and will not be stored   at home. NOTE: This sheet is a summary. It may not cover all possible information. If you have questions about this medicine, talk to your doctor, pharmacist, or health care provider.  2023 Elsevier/Gold Standard (2012-12-06 00:00:00)   

## 2022-11-10 NOTE — Progress Notes (Signed)
[place] OFFICE PROGRESS NOTE   Diagnosis: Colon cancer  INTERVAL HISTORY:   Stacy Burns returns as scheduled.  She was last treated with panitumumab on 10/27/2022.  She feels well.  No difficulty with bowel function.  No bleeding.  She continues encorafenib.  No abdominal pain.  She has intermittent back pain.  No hematuria.  Objective:  Vital signs in last 24 hours:  Blood pressure (!) 141/66, pulse 73, temperature 98.1 F (36.7 C), temperature source Oral, resp. rate 18, height 5\' 7"  (1.702 m), weight 140 lb (63.5 kg), SpO2 100 %.    HEENT: No thrush or ulcers Resp: Lungs clear bilaterally Cardio: Regular rate and rhythm GI: Nontender, no hepatosplenomegaly, no mass Vascular: No leg edema  Skin: Acne type rash over the face  Portacath/PICC-without erythema  Lab Results:  Lab Results  Component Value Date   WBC 6.0 11/10/2022   HGB 10.4 (L) 11/10/2022   HCT 31.9 (L) 11/10/2022   MCV 87.9 11/10/2022   PLT 178 11/10/2022   NEUTROABS 4.1 11/10/2022    CMP  Lab Results  Component Value Date   NA 138 11/10/2022   K 4.2 11/10/2022   CL 106 11/10/2022   CO2 24 11/10/2022   GLUCOSE 85 11/10/2022   BUN 17 11/10/2022   CREATININE 0.71 11/10/2022   CALCIUM 9.0 11/10/2022   PROT 6.6 11/10/2022   ALBUMIN 3.7 11/10/2022   AST 12 (L) 11/10/2022   ALT 8 11/10/2022   ALKPHOS 91 11/10/2022   BILITOT 0.3 11/10/2022   GFRNONAA >60 11/10/2022   GFRAA >60 05/06/2020    Lab Results  Component Value Date   CEA1 54.57 (H) 12/30/2020   CEA 13.64 (H) 11/10/2022    Medications: I have reviewed the patient's current medications.   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  Cirrhosis.  Innumerable liver lesions. Colonoscopy 12/18/2019-ulcerated partially obstructing large mass in the mid ascending colon.  Biopsy invasive adenocarcinoma;  preserved expression major MMR proteins Upper endoscopy 12/18/2019-normal esophagus, stomach, examined duodenum CEA 12/19/2019-8,923 Biopsy liver lesion 12/23/2019-adenocarcinoma Foundation 1-K-ras wild-type; tumor mutation burden greater than or equal to 10; microsatellite stable; BRAF V600E Cycle 1 FOLFOX 01/01/2020 Cycle 2 FOLFOX 01/15/2020  Cycle 3 FOLFOX 01/29/2020, Udenyca added Cycle 4 FOLFOX 02/12/2020, Udenyca held Cycle 5 FOLFOX 02/26/2020, Udenyca Cycle 6 FOLFOX 03/11/2020, Udenyca held CT abdomen/pelvis 03/16/2020-stable diffuse liver lesions, stable strictured appearing ascending colon, possible pericolonic implant, vague left lower lobe nodule Cycle 1 FOLFIRI/Avastin 04/06/2020 Cycle 2 FOLFIRI/Avastin 04/21/2020 Cycle 3 FOLFIRI/Avastin 05/06/2020 Cycle 4 FOLFIRI/Avastin 05/20/2020 Cycle 5 FOLFIRI/Avastin 06/03/2020 CTs 06/15/2020-mild to moderate improvement in hepatic metastasis.  No new or progressive disease.  Narrowing within the proximal ascending colon with upstream mild cecal and terminal ileum dilatation, similar. Cycle 6 FOLFIRI/Avastin 06/17/2020 Cycle 7 FOLFIRI/Avastin 07/01/2020 Cycle 8 FOLFIRI/Avastin 07/15/2020 Cycle 9 FOLFIRI/Avastin 07/29/2020-5-FU bolus eliminated, 5-FU infusion and irinotecan dose reduced Cycle 10 FOLFIRI/Avastin 08/12/2020 CTs 08/23/2020- mild residual wall thickening involving the cecum/ascending colon.  Numerous liver metastases improved. Cycle 11 FOLFIRI/Avastin 08/25/2020 Cycle 12 FOLFIRI/Avastin 09/16/2020 Cycle 13 FOLFIRI/Avastin 10/07/2020 Cycle 14 FOLFIRI/Avastin 10/28/2020 Cycle 15 FOLFIRI/Avastin 11/18/2020 CT abdomen/pelvis 12/06/2020-mild improvement in multifocal hepatic metastases, wall thickening at the ascending colon with pericolonic inflammatory changes Cycle 16 FOLFIRI/Avastin 12/09/2020 Cycle 17 FOLFIRI/Avastin 12/30/2020 Cycle 18 FOLFIRI/Avastin 01/20/2021 Cycle 19 FOLFIRI/Avastin 02/10/2021 Cycle 20 FOLFIRI/Avastin 03/03/2021 Cycle 21  FOLFIRI/Avastin 03/24/2021 Cycle 22 FOLFIRI/Avastin 04/14/2021 CTs 05/04/2021-slight improvement in hepatic  metastatic disease. Cycle 23 FOLFIRI/Avastin 05/05/2021 Cycle 24 FOLFIRI/Avastin 05/25/2021 Cycle 25 FOLFIRI/Avastin 06/15/2021 Cycle 26 FOLFIRI/Avastin 07/13/2021 Cycle 27 FOLFIRI/Avastin 08/02/2021 CTs 08/30/2021-majority of liver lesions are unchanged in size, 1 lesion adjacent to the gallbladder has increased, no adenopathy, persistent pericolonic stranding and peritoneal thickening at the cecum Progressive rise in CEA 08/31/2021-treatment changed to Xeloda/bevacizumab, began [place] 09/22/2021 CEA stable 11/03/2021 Xeloda/bevacizumab continued CTs 01/10/2022-increase in size of some of the liver lesions, others are stable, new tiny right lower lobe nodule, increased dilation of distal small bowel with increased wall thickening at the terminal ileum and ileocecal valve with 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, stable bilateral paracolic gutter peritoneal thickening Panitumumab/ Encorafenib 01/12/2022 (encorafenib 01/19/2022) Panitumumab 02/02/2022 Panitumumab 02/20/2022 Panitumumab 03/06/2022 Panitumumab 03/20/2022 Panitumumab 04/03/2022 CTs 04/14/2022-decrease size of liver metastases, new borderline subcarinal adenopathy, improved peritoneal thickening, cirrhosis or pseudocirrhosis, persistent dilated cecum Encorafenib and Panitumumab continued Panitumumab 04/18/2022 Panitumumab 05/05/2022 Panitumumab 05/19/2022 Panitumumab 06/02/2022 Panitumumab 06/16/2022, 4g IV mag and begin oral mag po once daily  Panitumumab 06/30/2022 Panitumumab 07/14/2022, 2 g IV magnesium, increase oral magnesium to 400 mg twice daily Panitumumab 07/28/2022, 2 g IV magnesium CTs 07/28/2022-no change in hepatic metastases, no new signs of metastatic disease, persistent mural thickening at the terminal ileum/cecum mild splenomegaly unchanged Panitumumab 08/18/2022 Panitumumab 09/01/2022 Panitumumab  09/15/2022 Panitumumab 09/29/2022 Panitumumab 10/13/2022 Panitumumab 10/27/2022 CTs 11/03/2022-unchanged hepatic metastases, no new lesions, 3.7 cm soft tissue density at the medial left kidney etiology unclear, improvement in wall thickening at the ileocecal junction development of appendiceal dilatation Anemia secondary to GI blood loss, on oral iron BID  Cirrhosis 12/26/2019-hospital admission for right lower extremity DVT and GI bleed, treated with heparin anticoagulation beginning 12/26/2019, converted to Lovenox at discharge 12/29/2019 Lovenox reduced to 40 mg daily 02/10/2021 secondary to bruising and nosebleeding History of low-grade fever-likely "tumor" fever COVID-19 infection January 2021        Disposition: Stacy Burns appears stable.  There is no clinical evidence for progression of the metastatic colon cancer.  She is tolerating the panitumumab and encorafenib well.  I reviewed the restaging CT findings and images with her.  The restaging CTs are consistent with stable disease.  I recommend continuing panitumumab and encorafenib.  She will receive IV magnesium supplementation today.  The etiology of the left renal soft tissue density is unclear.  I will present her case at the GI tumor conference for further review of this finding.  I doubt this is related to metastatic colon cancer.  Stacy Burns will return for an office visit and panitumumab in 2 weeks.  [name], MD  11/10/2022  9:30 AM

## 2022-11-10 NOTE — Patient Instructions (Signed)

## 2022-11-14 ENCOUNTER — Other Ambulatory Visit: Payer: Self-pay

## 2022-11-15 ENCOUNTER — Other Ambulatory Visit: Payer: Self-pay

## 2022-11-15 NOTE — Progress Notes (Signed)
The proposed treatment discussed in conference is for discussion purpose only and is not a binding recommendation.  The patients have not been physically examined, or presented with their treatment options.  Therefore, final treatment plans cannot be decided.  

## 2022-11-21 ENCOUNTER — Other Ambulatory Visit: Payer: Self-pay | Admitting: Oncology

## 2022-11-21 DIAGNOSIS — C182 Malignant neoplasm of ascending colon: Secondary | ICD-10-CM

## 2022-11-24 ENCOUNTER — Inpatient Hospital Stay: Payer: BC Managed Care – PPO | Admitting: Oncology

## 2022-11-24 ENCOUNTER — Inpatient Hospital Stay: Payer: BC Managed Care – PPO

## 2022-11-24 ENCOUNTER — Other Ambulatory Visit: Payer: Self-pay | Admitting: *Deleted

## 2022-11-24 ENCOUNTER — Encounter: Payer: Self-pay | Admitting: *Deleted

## 2022-11-24 ENCOUNTER — Inpatient Hospital Stay: Payer: BC Managed Care – PPO | Attending: Oncology

## 2022-11-24 ENCOUNTER — Other Ambulatory Visit: Payer: Self-pay | Admitting: Hematology and Oncology

## 2022-11-24 VITALS — BP 142/63 | HR 78 | Temp 98.1°F | Resp 20 | Ht 67.0 in | Wt 144.8 lb

## 2022-11-24 DIAGNOSIS — C182 Malignant neoplasm of ascending colon: Secondary | ICD-10-CM

## 2022-11-24 DIAGNOSIS — K7460 Unspecified cirrhosis of liver: Secondary | ICD-10-CM | POA: Diagnosis not present

## 2022-11-24 DIAGNOSIS — D500 Iron deficiency anemia secondary to blood loss (chronic): Secondary | ICD-10-CM | POA: Diagnosis not present

## 2022-11-24 DIAGNOSIS — C787 Secondary malignant neoplasm of liver and intrahepatic bile duct: Secondary | ICD-10-CM | POA: Insufficient documentation

## 2022-11-24 DIAGNOSIS — Z86718 Personal history of other venous thrombosis and embolism: Secondary | ICD-10-CM | POA: Diagnosis not present

## 2022-11-24 DIAGNOSIS — Z5112 Encounter for antineoplastic immunotherapy: Secondary | ICD-10-CM | POA: Diagnosis not present

## 2022-11-24 LAB — CBC WITH DIFFERENTIAL (CANCER CENTER ONLY)
Abs Immature Granulocytes: 0.02 10*3/uL (ref 0.00–0.07)
Basophils Absolute: 0 10*3/uL (ref 0.0–0.1)
Basophils Relative: 1 %
Eosinophils Absolute: 0.1 10*3/uL (ref 0.0–0.5)
Eosinophils Relative: 3 %
HCT: 31 % — ABNORMAL LOW (ref 36.0–46.0)
Hemoglobin: 10 g/dL — ABNORMAL LOW (ref 12.0–15.0)
Immature Granulocytes: 0 %
Lymphocytes Relative: 14 %
Lymphs Abs: 0.7 10*3/uL (ref 0.7–4.0)
MCH: 28.7 pg (ref 26.0–34.0)
MCHC: 32.3 g/dL (ref 30.0–36.0)
MCV: 88.8 fL (ref 80.0–100.0)
Monocytes Absolute: 0.7 10*3/uL (ref 0.1–1.0)
Monocytes Relative: 14 %
Neutro Abs: 3.2 10*3/uL (ref 1.7–7.7)
Neutrophils Relative %: 68 %
Platelet Count: 155 10*3/uL (ref 150–400)
RBC: 3.49 MIL/uL — ABNORMAL LOW (ref 3.87–5.11)
RDW: 16.3 % — ABNORMAL HIGH (ref 11.5–15.5)
WBC Count: 4.7 10*3/uL (ref 4.0–10.5)
nRBC: 0 % (ref 0.0–0.2)

## 2022-11-24 LAB — CMP (CANCER CENTER ONLY)
ALT: 10 U/L (ref 0–44)
AST: 13 U/L — ABNORMAL LOW (ref 15–41)
Albumin: 3.7 g/dL (ref 3.5–5.0)
Alkaline Phosphatase: 99 U/L (ref 38–126)
Anion gap: 7 (ref 5–15)
BUN: 19 mg/dL (ref 8–23)
CO2: 25 mmol/L (ref 22–32)
Calcium: 9.2 mg/dL (ref 8.9–10.3)
Chloride: 107 mmol/L (ref 98–111)
Creatinine: 0.64 mg/dL (ref 0.44–1.00)
GFR, Estimated: >60 mL/min (ref >60)
Glucose, Bld: 80 mg/dL (ref 70–99)
Potassium: 4.5 mmol/L (ref 3.5–5.1)
Sodium: 139 mmol/L (ref 135–145)
Total Bilirubin: 0.4 mg/dL (ref 0.3–1.2)
Total Protein: 6.2 g/dL — ABNORMAL LOW (ref 6.5–8.1)

## 2022-11-24 LAB — MAGNESIUM: Magnesium: 1.4 mg/dL — ABNORMAL LOW (ref 1.7–2.4)

## 2022-11-24 LAB — CEA (ACCESS): CEA (CHCC): 15.44 ng/mL — ABNORMAL HIGH (ref 0.00–5.00)

## 2022-11-24 MED ORDER — HEPARIN SOD (PORK) LOCK FLUSH 100 UNIT/ML IV SOLN
500.0000 [IU] | Freq: Once | INTRAVENOUS | Status: AC | PRN
  Administered 2022-11-24: 500 [IU]

## 2022-11-24 MED ORDER — SODIUM CHLORIDE 0.9% FLUSH
10.0000 mL | INTRAVENOUS | Status: DC | PRN
  Administered 2022-11-24: 10 mL

## 2022-11-24 MED ORDER — MAGNESIUM SULFATE 2 GM/50ML IV SOLN
2.0000 g | Freq: Once | INTRAVENOUS | Status: AC
  Administered 2022-11-24: 2 g via INTRAVENOUS
  Filled 2022-11-24: qty 50

## 2022-11-24 MED ORDER — SODIUM CHLORIDE 0.9 % IV SOLN
6.0000 mg/kg | Freq: Once | INTRAVENOUS | Status: AC
  Administered 2022-11-24: 400 mg via INTRAVENOUS
  Filled 2022-11-24: qty 20

## 2022-11-24 MED ORDER — SODIUM CHLORIDE 0.9 % IV SOLN: Freq: Once | INTRAVENOUS | Status: AC

## 2022-11-24 MED ORDER — BRAFTOVI 75 MG PO CAPS
ORAL_CAPSULE | ORAL | 1 refills | Status: DC
Start: 2022-11-24 — End: 2023-02-08

## 2022-11-24 NOTE — Progress Notes (Signed)
Patient seen by Dr. Truett Perna today  Vitals are within treatment parameters.  Labs reviewed by Dr. Truett Perna and are within treatment parameters.  Per physician team, patient is ready for treatment. Please note that modifications are being made to the treatment plan including Mg+ today 1.4--will administer 2 grams IV Mg+

## 2022-11-24 NOTE — Patient Instructions (Signed)
[facility]   Discharge Instructions: Thank you for choosing [facility] to provide your oncology and hematology care.   If you have a lab appointment with the Cancer Center, please go directly to the Cancer Center and check in at the registration area.   Wear comfortable clothing and clothing appropriate for easy access to any Portacath or PICC line.   We strive to give you quality time with your provider. You may need to reschedule your appointment if you arrive late (15 or more minutes).  Arriving late affects you and other patients whose appointments are after yours.  Also, if you miss three or more appointments without notifying the office, you may be dismissed from the clinic at the provider's discretion.      For prescription refill requests, have your pharmacy contact our office and allow 72 hours for refills to be completed.    Today you received the following chemotherapy and/or immunotherapy agents Panitumumab (VECTIBIX).      To help prevent nausea and vomiting after your treatment, we encourage you to take your nausea medication as directed.  BELOW ARE SYMPTOMS THAT SHOULD BE REPORTED IMMEDIATELY: *FEVER GREATER THAN 100.4 F (38 C) OR HIGHER *CHILLS OR SWEATING *NAUSEA AND VOMITING THAT IS NOT CONTROLLED WITH YOUR NAUSEA MEDICATION *UNUSUAL SHORTNESS OF BREATH *UNUSUAL BRUISING OR BLEEDING *URINARY PROBLEMS (pain or burning when urinating, or frequent urination) *BOWEL PROBLEMS (unusual diarrhea, constipation, pain near the anus) TENDERNESS IN MOUTH AND THROAT WITH OR WITHOUT PRESENCE OF ULCERS (sore throat, sores in mouth, or a toothache) UNUSUAL RASH, SWELLING OR PAIN  UNUSUAL VAGINAL DISCHARGE OR ITCHING   Items with * indicate a potential emergency and should be followed up as soon as possible or go to the Emergency Department if any problems should occur.  Please show the CHEMOTHERAPY ALERT CARD or IMMUNOTHERAPY ALERT  CARD at check-in to the Emergency Department and triage nurse.  Should you have questions after your visit or need to cancel or reschedule your appointment, please contact [facility]  Dept: [phone]  and follow the prompts.  Office hours are 8:00 a.m. to 4:30 p.m. Monday - Friday. Please note that voicemails left after 4:00 p.m. may not be returned until the following business day.  We are closed weekends and major holidays. You have access to a nurse at all times for urgent questions. Please call the main number to the clinic Dept: [phone] and follow the prompts.   For any non-urgent questions, you may also contact your provider using MyChart. We now offer e-Visits for anyone 18 and older to request care online for non-urgent symptoms. For details visit [email or web address].   Also download the MyChart app! Go to the app store, search "MyChart", open the app, select [facility], and log in with your MyChart username and password.  Panitumumab Injection What is this medication? PANITUMUMAB (pan i TOOM ue mab) treats colorectal cancer. It works by blocking a protein that causes cancer cells to grow and multiply. This helps to slow or stop the spread of cancer cells. It is a monoclonal antibody. This medicine may be used for other purposes; ask your health care provider or pharmacist if you have questions. COMMON BRAND NAME(S): Vectibix What should I tell my care team before I take this medication? They need to know if you have any of these conditions: Eye disease Low levels of magnesium in the blood Lung disease An unusual or allergic   reaction to panitumumab, other medications, foods, dyes, or preservatives Pregnant or trying to get pregnant Breast-feeding How should I use this medication? This medication is injected into a vein. It is given by your care team in a hospital or clinic setting. Talk to your care team about the use of this medication  in children. Special care may be needed. Overdosage: If you think you have taken too much of this medicine contact a poison control center or emergency room at once. NOTE: This medicine is only for you. Do not share this medicine with others. What if I miss a dose? Keep appointments for follow-up doses. It is important not to miss your dose. Call your care team if you are unable to keep an appointment. What may interact with this medication? Bevacizumab This list may not describe all possible interactions. Give your health care provider a list of all the medicines, herbs, non-prescription drugs, or dietary supplements you use. Also tell them if you smoke, drink alcohol, or use illegal drugs. Some items may interact with your medicine. What should I watch for while using this medication? Your condition will be monitored carefully while you are receiving this medication. This medication may make you feel generally unwell. This is not uncommon as chemotherapy can affect healthy cells as well as cancer cells. Report any side effects. Continue your course of treatment even though you feel ill unless your care team tells you to stop. This medication can make you more sensitive to the sun. Keep out of the sun while receiving this medication and for 2 months after stopping therapy. If you cannot avoid being in the sun, wear protective clothing and sunscreen. Do not use sun lamps, tanning beds, or tanning booths. Check with your care team if you have severe diarrhea, nausea, and vomiting or if you sweat a lot. The loss of too much body fluid may make it dangerous for you to take this medication. This medication may cause serious skin reactions. They can happen weeks to months after starting the medication. Contact your care team right away if you notice fevers or flu-like symptoms with a rash. The rash may be red or purple and then turn into blisters or peeling of the skin. You may also notice a red rash with  swelling of the face, lips, or lymph nodes in your neck or under your arms. Talk to your care team if you may be pregnant. Serious birth defects can occur if you take this medication during pregnancy and for 2 months after the last dose. Contraception is recommended while taking this medication and for 2 months after the last dose. Your care team can help you find the option that works for you. Do not breastfeed while taking this medication and for 2 months after the last dose. This medication may cause infertility. Talk to your care team if you are concerned about your fertility. What side effects may I notice from receiving this medication? Side effects that you should report to your care team as soon as possible: Allergic reactions--skin rash, itching, hives, swelling of the face, lips, tongue, or throat Dry cough, shortness of breath or trouble breathing Eye pain, redness, irritation, or discharge with blurry or decreased vision Infusion reactions--chest pain, shortness of breath or trouble breathing, feeling faint or lightheaded Low magnesium level--muscle pain or cramps, unusual weakness or fatigue, fast or irregular heartbeat, tremors Low potassium level--muscle pain or cramps, unusual weakness or fatigue, fast or irregular heartbeat, constipation Redness, blistering, peeling, or loosening   of the skin, including inside the mouth Skin reactions on sun-exposed areas Side effects that usually do not require medical attention (report to your care team if they continue or are bothersome): Change in nail shape, thickness, or color Diarrhea Dry skin Fatigue Nausea Vomiting This list may not describe all possible side effects. Call your doctor for medical advice about side effects. You may report side effects to FDA at 1-800-FDA-1088. Where should I keep my medication? This medication is given in a hospital or clinic. It will not be stored at home. NOTE: This sheet is a summary. It may not  cover all possible information. If you have questions about this medicine, talk to your doctor, pharmacist, or health care provider.  2023 Elsevier/Gold Standard (2021-12-12 00:00:00)  Magnesium Sulfate Injection What is this medication? MAGNESIUM SULFATE (mag NEE zee um SUL fate) prevents and treats low levels of magnesium in your body. It may also be used to prevent and treat seizures during pregnancy in people with high blood pressure disorders, such as preeclampsia or eclampsia. Magnesium plays an important role in maintaining the health of your muscles and nervous system. This medicine may be used for other purposes; ask your health care provider or pharmacist if you have questions. What should I tell my care team before I take this medication? They need to know if you have any of these conditions: Heart disease History of irregular heart beat Kidney disease An unusual or allergic reaction to magnesium sulfate, medications, foods, dyes, or preservatives Pregnant or trying to get pregnant Breast-feeding How should I use this medication? This medication is for infusion into a vein. It is given in a hospital or clinic setting. Talk to your care team about the use of this medication in children. While this medication may be prescribed for selected conditions, precautions do apply. Overdosage: If you think you have taken too much of this medicine contact a poison control center or emergency room at once. NOTE: This medicine is only for you. Do not share this medicine with others. What if I miss a dose? This does not apply. What may interact with this medication? Certain medications for anxiety or sleep Certain medications for seizures, such phenobarbital Digoxin Medications that relax muscles for surgery Narcotic medications for pain This list may not describe all possible interactions. Give your health care provider a list of all the medicines, herbs, non-prescription drugs, or dietary  supplements you use. Also tell them if you smoke, drink alcohol, or use illegal drugs. Some items may interact with your medicine. What should I watch for while using this medication? Your condition will be monitored carefully while you are receiving this medication. You may need blood work done while you are receiving this medication. What side effects may I notice from receiving this medication? Side effects that you should report to your care team as soon as possible: Allergic reactions--skin rash, itching, hives, swelling of the face, lips, tongue, or throat High magnesium level--confusion, drowsiness, facial flushing, redness, sweating, muscle weakness, fast or irregular heartbeat, trouble breathing Low blood pressure--dizziness, feeling faint or lightheaded, blurry vision Side effects that usually do not require medical attention (report to your care team if they continue or are bothersome): Headache Nausea This list may not describe all possible side effects. Call your doctor for medical advice about side effects. You may report side effects to FDA at 1-800-FDA-1088. Where should I keep my medication? This medication is given in a hospital or clinic and will not be stored   at home. NOTE: This sheet is a summary. It may not cover all possible information. If you have questions about this medicine, talk to your doctor, pharmacist, or health care provider.  2023 Elsevier/Gold Standard (2012-12-06 00:00:00)   

## 2022-11-24 NOTE — Progress Notes (Signed)
[facility] OFFICE PROGRESS NOTE   Diagnosis: Colon cancer  INTERVAL HISTORY:   Ms. Guzzardi returns as scheduled.  She completed another cycle of panitumumab on 11/10/2022.  She continues encorafenib.  She has developed erythema at the left great toe.  No other complaint.  Objective:  Vital signs in last 24 hours:  Blood pressure (!) 142/63, pulse 78, temperature 98.1 F (36.7 C), temperature source Oral, resp. rate 20, height 5\' 7"  (1.702 m), weight 144 lb 12.8 oz (65.7 kg), SpO2 99 %.    HEENT: No thrush or ulcers Resp: Lungs clear bilaterally Cardio: Regular rate and rhythm GI: Nontender, no mass, no hepatomegaly Vascular: No leg edema  Skin: Mild acne type rash over the face, induration surrounding the base of the left great toenail, no discharge, the soles are dry  Portacath/PICC-without erythema  Lab Results:  Lab Results  Component Value Date   WBC 4.7 11/24/2022   HGB 10.0 (L) 11/24/2022   HCT 31.0 (L) 11/24/2022   MCV 88.8 11/24/2022   PLT 155 11/24/2022   NEUTROABS 3.2 11/24/2022    CMP  Lab Results  Component Value Date   NA 139 11/24/2022   K 4.5 11/24/2022   CL 107 11/24/2022   CO2 25 11/24/2022   GLUCOSE 80 11/24/2022   BUN 19 11/24/2022   CREATININE 0.64 11/24/2022   CALCIUM 9.2 11/24/2022   PROT 6.2 (L) 11/24/2022   ALBUMIN 3.7 11/24/2022   AST 13 (L) 11/24/2022   ALT 10 11/24/2022   ALKPHOS 99 11/24/2022   BILITOT 0.4 11/24/2022   GFRNONAA >60 11/24/2022   GFRAA >60 05/06/2020    Lab Results  Component Value Date   CEA1 54.57 (H) 12/30/2020   CEA 15.44 (H) 11/24/2022     Medications: I have reviewed the patient's current medications.   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  Cirrhosis.  Innumerable liver lesions. Colonoscopy 12/18/2019-ulcerated partially obstructing large mass in the mid  ascending colon.  Biopsy invasive adenocarcinoma; preserved expression major MMR proteins Upper endoscopy 12/18/2019-normal esophagus, stomach, examined duodenum CEA 12/19/2019-8,923 Biopsy liver lesion 12/23/2019-adenocarcinoma Foundation 1-K-ras wild-type; tumor mutation burden greater than or equal to 10; microsatellite stable; BRAF V600E Cycle 1 FOLFOX 01/01/2020 Cycle 2 FOLFOX 01/15/2020  Cycle 3 FOLFOX 01/29/2020, Udenyca added Cycle 4 FOLFOX 02/12/2020, Udenyca held Cycle 5 FOLFOX 02/26/2020, Udenyca Cycle 6 FOLFOX 03/11/2020, Udenyca held CT abdomen/pelvis 03/16/2020-stable diffuse liver lesions, stable strictured appearing ascending colon, possible pericolonic implant, vague left lower lobe nodule Cycle 1 FOLFIRI/Avastin 04/06/2020 Cycle 2 FOLFIRI/Avastin 04/21/2020 Cycle 3 FOLFIRI/Avastin 05/06/2020 Cycle 4 FOLFIRI/Avastin 05/20/2020 Cycle 5 FOLFIRI/Avastin 06/03/2020 CTs 06/15/2020-mild to moderate improvement in hepatic metastasis.  No new or progressive disease.  Narrowing within the proximal ascending colon with upstream mild cecal and terminal ileum dilatation, similar. Cycle 6 FOLFIRI/Avastin 06/17/2020 Cycle 7 FOLFIRI/Avastin 07/01/2020 Cycle 8 FOLFIRI/Avastin 07/15/2020 Cycle 9 FOLFIRI/Avastin 07/29/2020-5-FU bolus eliminated, 5-FU infusion and irinotecan dose reduced Cycle 10 FOLFIRI/Avastin 08/12/2020 CTs 08/23/2020- mild residual wall thickening involving the cecum/ascending colon.  Numerous liver metastases improved. Cycle 11 FOLFIRI/Avastin 08/25/2020 Cycle 12 FOLFIRI/Avastin 09/16/2020 Cycle 13 FOLFIRI/Avastin 10/07/2020 Cycle 14 FOLFIRI/Avastin 10/28/2020 Cycle 15 FOLFIRI/Avastin 11/18/2020 CT abdomen/pelvis 12/06/2020-mild improvement in multifocal hepatic metastases, wall thickening at the ascending colon with pericolonic inflammatory changes Cycle 16 FOLFIRI/Avastin 12/09/2020 Cycle 17 FOLFIRI/Avastin 12/30/2020 Cycle 18 FOLFIRI/Avastin 01/20/2021 Cycle 19 FOLFIRI/Avastin 02/10/2021 Cycle 20  FOLFIRI/Avastin 03/03/2021 Cycle 21 FOLFIRI/Avastin 03/24/2021 Cycle  22 FOLFIRI/Avastin 04/14/2021 CTs 05/04/2021-slight improvement in hepatic metastatic disease. Cycle 23 FOLFIRI/Avastin 05/05/2021 Cycle 24 FOLFIRI/Avastin 05/25/2021 Cycle 25 FOLFIRI/Avastin 06/15/2021 Cycle 26 FOLFIRI/Avastin 07/13/2021 Cycle 27 FOLFIRI/Avastin 08/02/2021 CTs 08/30/2021-majority of liver lesions are unchanged in size, 1 lesion adjacent to the gallbladder has increased, no adenopathy, persistent pericolonic stranding and peritoneal thickening at the cecum Progressive rise in CEA 08/31/2021-treatment changed to Xeloda/bevacizumab, began [place] 09/22/2021 CEA stable 11/03/2021 Xeloda/bevacizumab continued CTs 01/10/2022-increase in size of some of the liver lesions, others are stable, new tiny right lower lobe nodule, increased dilation of distal small bowel with increased wall thickening at the terminal ileum and ileocecal valve with 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, stable bilateral paracolic gutter peritoneal thickening Panitumumab/ Encorafenib 01/12/2022 (encorafenib 01/19/2022) Panitumumab 02/02/2022 Panitumumab 02/20/2022 Panitumumab 03/06/2022 Panitumumab 03/20/2022 Panitumumab 04/03/2022 CTs 04/14/2022-decrease size of liver metastases, new borderline subcarinal adenopathy, improved peritoneal thickening, cirrhosis or pseudocirrhosis, persistent dilated cecum Encorafenib and Panitumumab continued Panitumumab 04/18/2022 Panitumumab 05/05/2022 Panitumumab 05/19/2022 Panitumumab 06/02/2022 Panitumumab 06/16/2022, 4g IV mag and begin oral mag po once daily  Panitumumab 06/30/2022 Panitumumab 07/14/2022, 2 g IV magnesium, increase oral magnesium to 400 mg twice daily Panitumumab 07/28/2022, 2 g IV magnesium CTs 07/28/2022-no change in hepatic metastases, no new signs of metastatic disease, persistent mural thickening at the terminal ileum/cecum mild splenomegaly unchanged Panitumumab 08/18/2022 Panitumumab  09/01/2022 Panitumumab 09/15/2022 Panitumumab 09/29/2022 Panitumumab 10/13/2022 Panitumumab 10/27/2022 CTs 11/03/2022-unchanged hepatic metastases, no new lesions, 3.7 cm soft tissue density at the medial left kidney etiology unclear, improvement in wall thickening at the ileocecal junction development of appendiceal dilatation Panitumumab 11/10/2022 Panitumumab 11/24/2022 Anemia secondary to GI blood loss, on oral iron BID  Cirrhosis 12/26/2019-hospital admission for right lower extremity DVT and GI bleed, treated with heparin anticoagulation beginning 12/26/2019, converted to Lovenox at discharge 12/29/2019 Lovenox reduced to 40 mg daily 02/10/2021 secondary to bruising and nosebleeding History of low-grade fever-likely "tumor" fever COVID-19 infection January 2021      Disposition: Ms. Klimas appears stable.  She will continue treatment with encorafenib and panitumumab.  She will receive magnesium supplementation today.  She will use Neosporin at the left great toenail.  She appears to have paronychia related to panitumumab.  She will return for an office visit in 2 weeks.  We discussed the left renal finding on the 11/03/2022 CT.  A CT urogram was recommended at the GI tumor conference discussion.  We decided to hold on this for now.  The kidney will be evaluated again when she has restaging CTs in June.  Thornton Papas, MD  11/24/2022  9:35 AM

## 2022-12-03 ENCOUNTER — Other Ambulatory Visit: Payer: Self-pay | Admitting: Oncology

## 2022-12-08 ENCOUNTER — Inpatient Hospital Stay: Payer: BC Managed Care – PPO

## 2022-12-08 ENCOUNTER — Inpatient Hospital Stay: Payer: BC Managed Care – PPO | Admitting: Oncology

## 2022-12-08 VITALS — BP 163/71 | HR 67 | Resp 18

## 2022-12-08 VITALS — BP 140/66 | HR 81 | Temp 98.1°F | Resp 18 | Ht 67.0 in | Wt 145.4 lb

## 2022-12-08 DIAGNOSIS — Z5112 Encounter for antineoplastic immunotherapy: Secondary | ICD-10-CM | POA: Diagnosis not present

## 2022-12-08 DIAGNOSIS — C182 Malignant neoplasm of ascending colon: Secondary | ICD-10-CM

## 2022-12-08 DIAGNOSIS — C787 Secondary malignant neoplasm of liver and intrahepatic bile duct: Secondary | ICD-10-CM | POA: Diagnosis not present

## 2022-12-08 DIAGNOSIS — Z86718 Personal history of other venous thrombosis and embolism: Secondary | ICD-10-CM | POA: Diagnosis not present

## 2022-12-08 DIAGNOSIS — K7460 Unspecified cirrhosis of liver: Secondary | ICD-10-CM | POA: Diagnosis not present

## 2022-12-08 DIAGNOSIS — D500 Iron deficiency anemia secondary to blood loss (chronic): Secondary | ICD-10-CM | POA: Diagnosis not present

## 2022-12-08 LAB — CBC WITH DIFFERENTIAL (CANCER CENTER ONLY)
Abs Immature Granulocytes: 0.03 10*3/uL (ref 0.00–0.07)
Basophils Absolute: 0.1 10*3/uL (ref 0.0–0.1)
Basophils Relative: 1 %
Eosinophils Absolute: 0.2 10*3/uL (ref 0.0–0.5)
Eosinophils Relative: 3 %
HCT: 30.6 % — ABNORMAL LOW (ref 36.0–46.0)
Hemoglobin: 10 g/dL — ABNORMAL LOW (ref 12.0–15.0)
Immature Granulocytes: 1 %
Lymphocytes Relative: 18 %
Lymphs Abs: 1 10*3/uL (ref 0.7–4.0)
MCH: 28.7 pg (ref 26.0–34.0)
MCHC: 32.7 g/dL (ref 30.0–36.0)
MCV: 87.7 fL (ref 80.0–100.0)
Monocytes Absolute: 0.6 10*3/uL (ref 0.1–1.0)
Monocytes Relative: 11 %
Neutro Abs: 3.5 10*3/uL (ref 1.7–7.7)
Neutrophils Relative %: 66 %
Platelet Count: 213 10*3/uL (ref 150–400)
RBC: 3.49 MIL/uL — ABNORMAL LOW (ref 3.87–5.11)
RDW: 15.8 % — ABNORMAL HIGH (ref 11.5–15.5)
WBC Count: 5.3 10*3/uL (ref 4.0–10.5)
nRBC: 0 % (ref 0.0–0.2)

## 2022-12-08 LAB — CMP (CANCER CENTER ONLY)
ALT: 10 U/L (ref 0–44)
AST: 15 U/L (ref 15–41)
Albumin: 3.7 g/dL (ref 3.5–5.0)
Alkaline Phosphatase: 93 U/L (ref 38–126)
Anion gap: 9 (ref 5–15)
BUN: 26 mg/dL — ABNORMAL HIGH (ref 8–23)
CO2: 21 mmol/L — ABNORMAL LOW (ref 22–32)
Calcium: 8.8 mg/dL — ABNORMAL LOW (ref 8.9–10.3)
Chloride: 110 mmol/L (ref 98–111)
Creatinine: 0.6 mg/dL (ref 0.44–1.00)
GFR, Estimated: >60 mL/min (ref >60)
Glucose, Bld: 73 mg/dL (ref 70–99)
Potassium: 4 mmol/L (ref 3.5–5.1)
Sodium: 140 mmol/L (ref 135–145)
Total Bilirubin: 0.3 mg/dL (ref 0.3–1.2)
Total Protein: 6.3 g/dL — ABNORMAL LOW (ref 6.5–8.1)

## 2022-12-08 LAB — MAGNESIUM: Magnesium: 1.3 mg/dL — ABNORMAL LOW (ref 1.7–2.4)

## 2022-12-08 LAB — CEA (ACCESS): CEA (CHCC): 17.62 ng/mL — ABNORMAL HIGH (ref 0.00–5.00)

## 2022-12-08 MED ORDER — SODIUM CHLORIDE 0.9 % IV SOLN: Freq: Once | INTRAVENOUS | Status: AC

## 2022-12-08 MED ORDER — SODIUM CHLORIDE 0.9 % IV SOLN
6.0000 mg/kg | Freq: Once | INTRAVENOUS | Status: AC
  Administered 2022-12-08: 400 mg via INTRAVENOUS
  Filled 2022-12-08: qty 20

## 2022-12-08 MED ORDER — MAGNESIUM SULFATE 2 GM/50ML IV SOLN
2.0000 g | Freq: Once | INTRAVENOUS | Status: AC
  Administered 2022-12-08: 2 g via INTRAVENOUS
  Filled 2022-12-08: qty 50

## 2022-12-08 MED ORDER — HEPARIN SOD (PORK) LOCK FLUSH 100 UNIT/ML IV SOLN
500.0000 [IU] | Freq: Once | INTRAVENOUS | Status: AC | PRN
  Administered 2022-12-08: 500 [IU]

## 2022-12-08 MED ORDER — SODIUM CHLORIDE 0.9% FLUSH
10.0000 mL | INTRAVENOUS | Status: DC | PRN
  Administered 2022-12-08: 10 mL

## 2022-12-08 NOTE — Progress Notes (Signed)
Patient seen by Dr. Truett Perna today  Vitals are within treatment parameters.  Labs reviewed by Dr. Truett Perna and are not all within treatment parameters. Magnesium 1.3 mg/dL  Per physician team, patient is ready for treatment. Please note that modifications are being made to the treatment plan including additional 2g of magnesium IV today per MD Truett Perna.

## 2022-12-08 NOTE — Progress Notes (Signed)
[facility] OFFICE PROGRESS NOTE   Diagnosis: Colon cancer  INTERVAL HISTORY:   Stacy Burns returns as scheduled.  She completed another treatment with panitumumab 11/24/2022.  She continues encorafenib.  No diarrhea.  She has skin lesions over the face.  Objective:  Vital signs in last 24 hours:  Blood pressure (!) 140/66, pulse 81, temperature 98.1 F (36.7 C), temperature source Oral, resp. rate 18, height 5\' 7"  (1.702 m), weight 145 lb 6.4 oz (66 kg), SpO2 99 %.    HEENT: No thrush or ulcers Resp: Lungs clear bilaterally Cardio: Regular rate and rhythm GI: Nontender, no mass, no hepatosplenomegaly Vascular: No leg edema  Skin: Acne type lesions over the forehead and cheeks, area of healing paronychia at the left great toe  Portacath/PICC-without erythema  Lab Results:  Lab Results  Component Value Date   WBC 5.3 12/08/2022   HGB 10.0 (L) 12/08/2022   HCT 30.6 (L) 12/08/2022   MCV 87.7 12/08/2022   PLT 213 12/08/2022   NEUTROABS 3.5 12/08/2022    CMP  Lab Results  Component Value Date   NA 140 12/08/2022   K 4.0 12/08/2022   CL 110 12/08/2022   CO2 21 (L) 12/08/2022   GLUCOSE 73 12/08/2022   BUN 26 (H) 12/08/2022   CREATININE 0.60 12/08/2022   CALCIUM 8.8 (L) 12/08/2022   PROT 6.3 (L) 12/08/2022   ALBUMIN 3.7 12/08/2022   AST 15 12/08/2022   ALT 10 12/08/2022   ALKPHOS 93 12/08/2022   BILITOT 0.3 12/08/2022   GFRNONAA >60 12/08/2022   GFRAA >60 05/06/2020    Lab Results  Component Value Date   CEA1 54.57 (H) 12/30/2020   CEA 15.44 (H) 11/24/2022   Medications: I have reviewed the patient's current medications.   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  Cirrhosis.  Innumerable liver lesions. Colonoscopy 12/18/2019-ulcerated partially obstructing large mass in the mid ascending colon.  Biopsy invasive  adenocarcinoma; preserved expression major MMR proteins Upper endoscopy 12/18/2019-normal esophagus, stomach, examined duodenum CEA 12/19/2019-8,923 Biopsy liver lesion 12/23/2019-adenocarcinoma Foundation 1-K-ras wild-type; tumor mutation burden greater than or equal to 10; microsatellite stable; BRAF V600E Cycle 1 FOLFOX 01/01/2020 Cycle 2 FOLFOX 01/15/2020  Cycle 3 FOLFOX 01/29/2020, Udenyca added Cycle 4 FOLFOX 02/12/2020, Udenyca held Cycle 5 FOLFOX 02/26/2020, Udenyca Cycle 6 FOLFOX 03/11/2020, Udenyca held CT abdomen/pelvis 03/16/2020-stable diffuse liver lesions, stable strictured appearing ascending colon, possible pericolonic implant, vague left lower lobe nodule Cycle 1 FOLFIRI/Avastin 04/06/2020 Cycle 2 FOLFIRI/Avastin 04/21/2020 Cycle 3 FOLFIRI/Avastin 05/06/2020 Cycle 4 FOLFIRI/Avastin 05/20/2020 Cycle 5 FOLFIRI/Avastin 06/03/2020 CTs 06/15/2020-mild to moderate improvement in hepatic metastasis.  No new or progressive disease.  Narrowing within the proximal ascending colon with upstream mild cecal and terminal ileum dilatation, similar. Cycle 6 FOLFIRI/Avastin 06/17/2020 Cycle 7 FOLFIRI/Avastin 07/01/2020 Cycle 8 FOLFIRI/Avastin 07/15/2020 Cycle 9 FOLFIRI/Avastin 07/29/2020-5-FU bolus eliminated, 5-FU infusion and irinotecan dose reduced Cycle 10 FOLFIRI/Avastin 08/12/2020 CTs 08/23/2020- mild residual wall thickening involving the cecum/ascending colon.  Numerous liver metastases improved. Cycle 11 FOLFIRI/Avastin 08/25/2020 Cycle 12 FOLFIRI/Avastin 09/16/2020 Cycle 13 FOLFIRI/Avastin 10/07/2020 Cycle 14 FOLFIRI/Avastin 10/28/2020 Cycle 15 FOLFIRI/Avastin 11/18/2020 CT abdomen/pelvis 12/06/2020-mild improvement in multifocal hepatic metastases, wall thickening at the ascending colon with pericolonic inflammatory changes Cycle 16 FOLFIRI/Avastin 12/09/2020 Cycle 17 FOLFIRI/Avastin 12/30/2020 Cycle 18 FOLFIRI/Avastin 01/20/2021 Cycle 19 FOLFIRI/Avastin 02/10/2021 Cycle 20 FOLFIRI/Avastin 03/03/2021 Cycle  21 FOLFIRI/Avastin 03/24/2021 Cycle 22 FOLFIRI/Avastin 04/14/2021 CTs 05/04/2021-slight improvement in hepatic metastatic  disease. Cycle 23 FOLFIRI/Avastin 05/05/2021 Cycle 24 FOLFIRI/Avastin 05/25/2021 Cycle 25 FOLFIRI/Avastin 06/15/2021 Cycle 26 FOLFIRI/Avastin 07/13/2021 Cycle 27 FOLFIRI/Avastin 08/02/2021 CTs 08/30/2021-majority of liver lesions are unchanged in size, 1 lesion adjacent to the gallbladder has increased, no adenopathy, persistent pericolonic stranding and peritoneal thickening at the cecum Progressive rise in CEA 08/31/2021-treatment changed to Xeloda/bevacizumab, began [place] 09/22/2021 CEA stable 11/03/2021 Xeloda/bevacizumab continued CTs 01/10/2022-increase in size of some of the liver lesions, others are stable, new tiny right lower lobe nodule, increased dilation of distal small bowel with increased wall thickening at the terminal ileum and ileocecal valve with 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, stable bilateral paracolic gutter peritoneal thickening Panitumumab/ Encorafenib 01/12/2022 (encorafenib 01/19/2022) Panitumumab 02/02/2022 Panitumumab 02/20/2022 Panitumumab 03/06/2022 Panitumumab 03/20/2022 Panitumumab 04/03/2022 CTs 04/14/2022-decrease size of liver metastases, new borderline subcarinal adenopathy, improved peritoneal thickening, cirrhosis or pseudocirrhosis, persistent dilated cecum Encorafenib and Panitumumab continued Panitumumab 04/18/2022 Panitumumab 05/05/2022 Panitumumab 05/19/2022 Panitumumab 06/02/2022 Panitumumab 06/16/2022, 4g IV mag and begin oral mag po once daily  Panitumumab 06/30/2022 Panitumumab 07/14/2022, 2 g IV magnesium, increase oral magnesium to 400 mg twice daily Panitumumab 07/28/2022, 2 g IV magnesium CTs 07/28/2022-no change in hepatic metastases, no new signs of metastatic disease, persistent mural thickening at the terminal ileum/cecum mild splenomegaly unchanged Panitumumab 08/18/2022 Panitumumab 09/01/2022 Panitumumab  09/15/2022 Panitumumab 09/29/2022 Panitumumab 10/13/2022 Panitumumab 10/27/2022 CTs 11/03/2022-unchanged hepatic metastases, no new lesions, 3.7 cm soft tissue density at the medial left kidney etiology unclear, improvement in wall thickening at the ileocecal junction development of appendiceal dilatation Panitumumab 11/10/2022 Panitumumab 11/24/2022 Panitumumab 12/08/2022 Anemia secondary to GI blood loss, on oral iron BID  Cirrhosis 12/26/2019-hospital admission for right lower extremity DVT and GI bleed, treated with heparin anticoagulation beginning 12/26/2019, converted to Lovenox at discharge 12/29/2019 Lovenox reduced to 40 mg daily 02/10/2021 secondary to bruising and nosebleeding History of low-grade fever-likely "tumor" fever COVID-19 infection January 2021        Disposition: Stacy Burns appears stable.  She continues to tolerate the panitumumab and encorafenib well.  She will complete another treatment with panitumumab today.  She will return for an office visit and panitumumab in 2 weeks.  She will receive IV magnesium supplementation today.  Thornton Papas, MD  12/08/2022  9:12 AM

## 2022-12-08 NOTE — Patient Instructions (Signed)
[facility]   Discharge Instructions: Thank you for choosing [facility] to provide your oncology and hematology care.   If you have a lab appointment with the Cancer Center, please go directly to the Cancer Center and check in at the registration area.   Wear comfortable clothing and clothing appropriate for easy access to any Portacath or PICC line.   We strive to give you quality time with your provider. You may need to reschedule your appointment if you arrive late (15 or more minutes).  Arriving late affects you and other patients whose appointments are after yours.  Also, if you miss three or more appointments without notifying the office, you may be dismissed from the clinic at the provider's discretion.      For prescription refill requests, have your pharmacy contact our office and allow 72 hours for refills to be completed.    Today you received the following chemotherapy and/or immunotherapy agents Panitumumab (VECTIBIX).      To help prevent nausea and vomiting after your treatment, we encourage you to take your nausea medication as directed.  BELOW ARE SYMPTOMS THAT SHOULD BE REPORTED IMMEDIATELY: *FEVER GREATER THAN 100.4 F (38 C) OR HIGHER *CHILLS OR SWEATING *NAUSEA AND VOMITING THAT IS NOT CONTROLLED WITH YOUR NAUSEA MEDICATION *UNUSUAL SHORTNESS OF BREATH *UNUSUAL BRUISING OR BLEEDING *URINARY PROBLEMS (pain or burning when urinating, or frequent urination) *BOWEL PROBLEMS (unusual diarrhea, constipation, pain near the anus) TENDERNESS IN MOUTH AND THROAT WITH OR WITHOUT PRESENCE OF ULCERS (sore throat, sores in mouth, or a toothache) UNUSUAL RASH, SWELLING OR PAIN  UNUSUAL VAGINAL DISCHARGE OR ITCHING   Items with * indicate a potential emergency and should be followed up as soon as possible or go to the Emergency Department if any problems should occur.  Please show the CHEMOTHERAPY ALERT CARD or IMMUNOTHERAPY ALERT  CARD at check-in to the Emergency Department and triage nurse.  Should you have questions after your visit or need to cancel or reschedule your appointment, please contact [facility]  Dept: [phone]  and follow the prompts.  Office hours are 8:00 a.m. to 4:30 p.m. Monday - Friday. Please note that voicemails left after 4:00 p.m. may not be returned until the following business day.  We are closed weekends and major holidays. You have access to a nurse at all times for urgent questions. Please call the main number to the clinic Dept: [phone] and follow the prompts.   For any non-urgent questions, you may also contact your provider using MyChart. We now offer e-Visits for anyone 18 and older to request care online for non-urgent symptoms. For details visit [email or web address].   Also download the MyChart app! Go to the app store, search "MyChart", open the app, select , and log in with your MyChart username and password.  Panitumumab Injection What is this medication? PANITUMUMAB (pan i TOOM ue mab) treats colorectal cancer. It works by blocking a protein that causes cancer cells to grow and multiply. This helps to slow or stop the spread of cancer cells. It is a monoclonal antibody. This medicine may be used for other purposes; ask your health care provider or pharmacist if you have questions. COMMON BRAND NAME(S): Vectibix What should I tell my care team before I take this medication? They need to know if you have any of these conditions: Eye disease Low levels of magnesium in the blood Lung disease An unusual or allergic   reaction to panitumumab, other medications, foods, dyes, or preservatives Pregnant or trying to get pregnant Breast-feeding How should I use this medication? This medication is injected into a vein. It is given by your care team in a hospital or clinic setting. Talk to your care team about the use of this medication  in children. Special care may be needed. Overdosage: If you think you have taken too much of this medicine contact a poison control center or emergency room at once. NOTE: This medicine is only for you. Do not share this medicine with others. What if I miss a dose? Keep appointments for follow-up doses. It is important not to miss your dose. Call your care team if you are unable to keep an appointment. What may interact with this medication? Bevacizumab This list may not describe all possible interactions. Give your health care provider a list of all the medicines, herbs, non-prescription drugs, or dietary supplements you use. Also tell them if you smoke, drink alcohol, or use illegal drugs. Some items may interact with your medicine. What should I watch for while using this medication? Your condition will be monitored carefully while you are receiving this medication. This medication may make you feel generally unwell. This is not uncommon as chemotherapy can affect healthy cells as well as cancer cells. Report any side effects. Continue your course of treatment even though you feel ill unless your care team tells you to stop. This medication can make you more sensitive to the sun. Keep out of the sun while receiving this medication and for 2 months after stopping therapy. If you cannot avoid being in the sun, wear protective clothing and sunscreen. Do not use sun lamps, tanning beds, or tanning booths. Check with your care team if you have severe diarrhea, nausea, and vomiting or if you sweat a lot. The loss of too much body fluid may make it dangerous for you to take this medication. This medication may cause serious skin reactions. They can happen weeks to months after starting the medication. Contact your care team right away if you notice fevers or flu-like symptoms with a rash. The rash may be red or purple and then turn into blisters or peeling of the skin. You may also notice a red rash with  swelling of the face, lips, or lymph nodes in your neck or under your arms. Talk to your care team if you may be pregnant. Serious birth defects can occur if you take this medication during pregnancy and for 2 months after the last dose. Contraception is recommended while taking this medication and for 2 months after the last dose. Your care team can help you find the option that works for you. Do not breastfeed while taking this medication and for 2 months after the last dose. This medication may cause infertility. Talk to your care team if you are concerned about your fertility. What side effects may I notice from receiving this medication? Side effects that you should report to your care team as soon as possible: Allergic reactions--skin rash, itching, hives, swelling of the face, lips, tongue, or throat Dry cough, shortness of breath or trouble breathing Eye pain, redness, irritation, or discharge with blurry or decreased vision Infusion reactions--chest pain, shortness of breath or trouble breathing, feeling faint or lightheaded Low magnesium level--muscle pain or cramps, unusual weakness or fatigue, fast or irregular heartbeat, tremors Low potassium level--muscle pain or cramps, unusual weakness or fatigue, fast or irregular heartbeat, constipation Redness, blistering, peeling, or loosening   of the skin, including inside the mouth Skin reactions on sun-exposed areas Side effects that usually do not require medical attention (report to your care team if they continue or are bothersome): Change in nail shape, thickness, or color Diarrhea Dry skin Fatigue Nausea Vomiting This list may not describe all possible side effects. Call your doctor for medical advice about side effects. You may report side effects to FDA at 1-800-FDA-1088. Where should I keep my medication? This medication is given in a hospital or clinic. It will not be stored at home. NOTE: This sheet is a summary. It may not  cover all possible information. If you have questions about this medicine, talk to your doctor, pharmacist, or health care provider.  2023 Elsevier/Gold Standard (2021-12-12 00:00:00)  Magnesium Sulfate Injection What is this medication? MAGNESIUM SULFATE (mag NEE zee um SUL fate) prevents and treats low levels of magnesium in your body. It may also be used to prevent and treat seizures during pregnancy in people with high blood pressure disorders, such as preeclampsia or eclampsia. Magnesium plays an important role in maintaining the health of your muscles and nervous system. This medicine may be used for other purposes; ask your health care provider or pharmacist if you have questions. What should I tell my care team before I take this medication? They need to know if you have any of these conditions: Heart disease History of irregular heart beat Kidney disease An unusual or allergic reaction to magnesium sulfate, medications, foods, dyes, or preservatives Pregnant or trying to get pregnant Breast-feeding How should I use this medication? This medication is for infusion into a vein. It is given in a hospital or clinic setting. Talk to your care team about the use of this medication in children. While this medication may be prescribed for selected conditions, precautions do apply. Overdosage: If you think you have taken too much of this medicine contact a poison control center or emergency room at once. NOTE: This medicine is only for you. Do not share this medicine with others. What if I miss a dose? This does not apply. What may interact with this medication? Certain medications for anxiety or sleep Certain medications for seizures, such phenobarbital Digoxin Medications that relax muscles for surgery Narcotic medications for pain This list may not describe all possible interactions. Give your health care provider a list of all the medicines, herbs, non-prescription drugs, or dietary  supplements you use. Also tell them if you smoke, drink alcohol, or use illegal drugs. Some items may interact with your medicine. What should I watch for while using this medication? Your condition will be monitored carefully while you are receiving this medication. You may need blood work done while you are receiving this medication. What side effects may I notice from receiving this medication? Side effects that you should report to your care team as soon as possible: Allergic reactions--skin rash, itching, hives, swelling of the face, lips, tongue, or throat High magnesium level--confusion, drowsiness, facial flushing, redness, sweating, muscle weakness, fast or irregular heartbeat, trouble breathing Low blood pressure--dizziness, feeling faint or lightheaded, blurry vision Side effects that usually do not require medical attention (report to your care team if they continue or are bothersome): Headache Nausea This list may not describe all possible side effects. Call your doctor for medical advice about side effects. You may report side effects to FDA at 1-800-FDA-1088. Where should I keep my medication? This medication is given in a hospital or clinic and will not be stored   at home. NOTE: This sheet is a summary. It may not cover all possible information. If you have questions about this medicine, talk to your doctor, pharmacist, or health care provider.  2023 Elsevier/Gold Standard (2012-12-06 00:00:00)   

## 2022-12-14 ENCOUNTER — Other Ambulatory Visit: Payer: Self-pay

## 2022-12-16 ENCOUNTER — Other Ambulatory Visit: Payer: Self-pay

## 2022-12-16 ENCOUNTER — Other Ambulatory Visit: Payer: Self-pay | Admitting: Oncology

## 2022-12-22 ENCOUNTER — Encounter: Payer: Self-pay | Admitting: *Deleted

## 2022-12-22 ENCOUNTER — Inpatient Hospital Stay: Payer: BC Managed Care – PPO | Admitting: Oncology

## 2022-12-22 ENCOUNTER — Other Ambulatory Visit: Payer: Self-pay | Admitting: *Deleted

## 2022-12-22 ENCOUNTER — Inpatient Hospital Stay: Payer: BC Managed Care – PPO | Attending: Oncology

## 2022-12-22 ENCOUNTER — Encounter: Payer: Self-pay | Admitting: Oncology

## 2022-12-22 ENCOUNTER — Inpatient Hospital Stay: Payer: BC Managed Care – PPO

## 2022-12-22 VITALS — BP 136/77 | HR 72 | Resp 18

## 2022-12-22 VITALS — BP 146/62 | HR 79 | Temp 98.1°F | Resp 18 | Ht 67.0 in | Wt 146.8 lb

## 2022-12-22 DIAGNOSIS — C182 Malignant neoplasm of ascending colon: Secondary | ICD-10-CM | POA: Insufficient documentation

## 2022-12-22 DIAGNOSIS — Z86718 Personal history of other venous thrombosis and embolism: Secondary | ICD-10-CM | POA: Insufficient documentation

## 2022-12-22 DIAGNOSIS — D500 Iron deficiency anemia secondary to blood loss (chronic): Secondary | ICD-10-CM | POA: Diagnosis not present

## 2022-12-22 DIAGNOSIS — Z5112 Encounter for antineoplastic immunotherapy: Secondary | ICD-10-CM | POA: Diagnosis not present

## 2022-12-22 DIAGNOSIS — C787 Secondary malignant neoplasm of liver and intrahepatic bile duct: Secondary | ICD-10-CM | POA: Insufficient documentation

## 2022-12-22 DIAGNOSIS — K7460 Unspecified cirrhosis of liver: Secondary | ICD-10-CM | POA: Insufficient documentation

## 2022-12-22 LAB — CMP (CANCER CENTER ONLY)
ALT: 12 U/L (ref 0–44)
AST: 17 U/L (ref 15–41)
Albumin: 3.8 g/dL (ref 3.5–5.0)
Alkaline Phosphatase: 118 U/L (ref 38–126)
Anion gap: 7 (ref 5–15)
BUN: 23 mg/dL (ref 8–23)
CO2: 24 mmol/L (ref 22–32)
Calcium: 9.1 mg/dL (ref 8.9–10.3)
Chloride: 107 mmol/L (ref 98–111)
Creatinine: 0.73 mg/dL (ref 0.44–1.00)
GFR, Estimated: >60 mL/min (ref >60)
Glucose, Bld: 90 mg/dL (ref 70–99)
Potassium: 5 mmol/L (ref 3.5–5.1)
Sodium: 138 mmol/L (ref 135–145)
Total Bilirubin: 0.4 mg/dL (ref 0.3–1.2)
Total Protein: 6.7 g/dL (ref 6.5–8.1)

## 2022-12-22 LAB — CBC WITH DIFFERENTIAL (CANCER CENTER ONLY)
Abs Immature Granulocytes: 0.01 10*3/uL (ref 0.00–0.07)
Basophils Absolute: 0 10*3/uL (ref 0.0–0.1)
Basophils Relative: 1 %
Eosinophils Absolute: 0.2 10*3/uL (ref 0.0–0.5)
Eosinophils Relative: 4 %
HCT: 31.2 % — ABNORMAL LOW (ref 36.0–46.0)
Hemoglobin: 10.2 g/dL — ABNORMAL LOW (ref 12.0–15.0)
Immature Granulocytes: 0 %
Lymphocytes Relative: 16 %
Lymphs Abs: 0.9 10*3/uL (ref 0.7–4.0)
MCH: 29 pg (ref 26.0–34.0)
MCHC: 32.7 g/dL (ref 30.0–36.0)
MCV: 88.6 fL (ref 80.0–100.0)
Monocytes Absolute: 0.7 10*3/uL (ref 0.1–1.0)
Monocytes Relative: 12 %
Neutro Abs: 3.6 10*3/uL (ref 1.7–7.7)
Neutrophils Relative %: 67 %
Platelet Count: 198 10*3/uL (ref 150–400)
RBC: 3.52 MIL/uL — ABNORMAL LOW (ref 3.87–5.11)
RDW: 15.6 % — ABNORMAL HIGH (ref 11.5–15.5)
WBC Count: 5.3 10*3/uL (ref 4.0–10.5)
nRBC: 0 % (ref 0.0–0.2)

## 2022-12-22 LAB — MAGNESIUM: Magnesium: 1.6 mg/dL — ABNORMAL LOW (ref 1.7–2.4)

## 2022-12-22 LAB — CEA (ACCESS): CEA (CHCC): 18.04 ng/mL — ABNORMAL HIGH (ref 0.00–5.00)

## 2022-12-22 MED ORDER — HEPARIN SOD (PORK) LOCK FLUSH 100 UNIT/ML IV SOLN
500.0000 [IU] | Freq: Once | INTRAVENOUS | Status: AC | PRN
  Administered 2022-12-22: 500 [IU]

## 2022-12-22 MED ORDER — SODIUM CHLORIDE 0.9 % IV SOLN: Freq: Once | INTRAVENOUS | Status: AC

## 2022-12-22 MED ORDER — OXYCODONE-ACETAMINOPHEN 5-325 MG PO TABS
1.0000 | ORAL_TABLET | Freq: Three times a day (TID) | ORAL | 0 refills | Status: DC | PRN
Start: 2022-12-22 — End: 2023-02-02

## 2022-12-22 MED ORDER — SODIUM CHLORIDE 0.9% FLUSH
10.0000 mL | INTRAVENOUS | Status: DC | PRN
  Administered 2022-12-22: 10 mL

## 2022-12-22 MED ORDER — PROCHLORPERAZINE MALEATE 10 MG PO TABS: 10.0000 mg | ORAL_TABLET | Freq: Four times a day (QID) | ORAL | 1 refills | Status: DC | PRN

## 2022-12-22 MED ORDER — MAGIC MOUTHWASH: 5.0000 mL | Freq: Four times a day (QID) | ORAL | 1 refills | Status: DC | PRN

## 2022-12-22 MED ORDER — DIPHENOXYLATE-ATROPINE 2.5-0.025 MG PO TABS
1.0000 | ORAL_TABLET | Freq: Four times a day (QID) | ORAL | 1 refills | Status: DC | PRN
Start: 2022-12-22 — End: 2023-01-19

## 2022-12-22 MED ORDER — SODIUM CHLORIDE 0.9 % IV SOLN
6.0000 mg/kg | Freq: Once | INTRAVENOUS | Status: AC
  Administered 2022-12-22: 400 mg via INTRAVENOUS
  Filled 2022-12-22: qty 20

## 2022-12-22 NOTE — Patient Instructions (Signed)
[facility]   Discharge Instructions: Thank you for choosing [facility] to provide your oncology and hematology care.   If you have a lab appointment with the Cancer Center, please go directly to the Cancer Center and check in at the registration area.   Wear comfortable clothing and clothing appropriate for easy access to any Portacath or PICC line.   We strive to give you quality time with your provider. You may need to reschedule your appointment if you arrive late (15 or more minutes).  Arriving late affects you and other patients whose appointments are after yours.  Also, if you miss three or more appointments without notifying the office, you may be dismissed from the clinic at the provider's discretion.      For prescription refill requests, have your pharmacy contact our office and allow 72 hours for refills to be completed.    Today you received the following chemotherapy and/or immunotherapy agents Vectibix.      To help prevent nausea and vomiting after your treatment, we encourage you to take your nausea medication as directed.  BELOW ARE SYMPTOMS THAT SHOULD BE REPORTED IMMEDIATELY: *FEVER GREATER THAN 100.4 F (38 C) OR HIGHER *CHILLS OR SWEATING *NAUSEA AND VOMITING THAT IS NOT CONTROLLED WITH YOUR NAUSEA MEDICATION *UNUSUAL SHORTNESS OF BREATH *UNUSUAL BRUISING OR BLEEDING *URINARY PROBLEMS (pain or burning when urinating, or frequent urination) *BOWEL PROBLEMS (unusual diarrhea, constipation, pain near the anus) TENDERNESS IN MOUTH AND THROAT WITH OR WITHOUT PRESENCE OF ULCERS (sore throat, sores in mouth, or a toothache) UNUSUAL RASH, SWELLING OR PAIN  UNUSUAL VAGINAL DISCHARGE OR ITCHING   Items with * indicate a potential emergency and should be followed up as soon as possible or go to the Emergency Department if any problems should occur.  Please show the CHEMOTHERAPY ALERT CARD or IMMUNOTHERAPY ALERT CARD at  check-in to the Emergency Department and triage nurse.  Should you have questions after your visit or need to cancel or reschedule your appointment, please contact [facility]  Dept: [phone]  and follow the prompts.  Office hours are 8:00 a.m. to 4:30 p.m. Monday - Friday. Please note that voicemails left after 4:00 p.m. may not be returned until the following business day.  We are closed weekends and major holidays. You have access to a nurse at all times for urgent questions. Please call the main number to the clinic Dept: [phone] and follow the prompts.   For any non-urgent questions, you may also contact your provider using MyChart. We now offer e-Visits for anyone 18 and older to request care online for non-urgent symptoms. For details visit [email or web address].   Also download the MyChart app! Go to the app store, search "MyChart", open the app, select [facility], and log in with your MyChart username and password.  Panitumumab Injection What is this medication? PANITUMUMAB (pan i TOOM ue mab) treats colorectal cancer. It works by blocking a protein that causes cancer cells to grow and multiply. This helps to slow or stop the spread of cancer cells. It is a monoclonal antibody. This medicine may be used for other purposes; ask your health care provider or pharmacist if you have questions. COMMON BRAND NAME(S): Vectibix What should I tell my care team before I take this medication? They need to know if you have any of these conditions: Eye disease Low levels of magnesium in the blood Lung disease An unusual or allergic reaction   to panitumumab, other medications, foods, dyes, or preservatives Pregnant or trying to get pregnant Breast-feeding How should I use this medication? This medication is injected into a vein. It is given by your care team in a hospital or clinic setting. Talk to your care team about the use of this medication in  children. Special care may be needed. Overdosage: If you think you have taken too much of this medicine contact a poison control center or emergency room at once. NOTE: This medicine is only for you. Do not share this medicine with others. What if I miss a dose? Keep appointments for follow-up doses. It is important not to miss your dose. Call your care team if you are unable to keep an appointment. What may interact with this medication? Bevacizumab This list may not describe all possible interactions. Give your health care provider a list of all the medicines, herbs, non-prescription drugs, or dietary supplements you use. Also tell them if you smoke, drink alcohol, or use illegal drugs. Some items may interact with your medicine. What should I watch for while using this medication? Your condition will be monitored carefully while you are receiving this medication. This medication may make you feel generally unwell. This is not uncommon as chemotherapy can affect healthy cells as well as cancer cells. Report any side effects. Continue your course of treatment even though you feel ill unless your care team tells you to stop. This medication can make you more sensitive to the sun. Keep out of the sun while receiving this medication and for 2 months after stopping therapy. If you cannot avoid being in the sun, wear protective clothing and sunscreen. Do not use sun lamps, tanning beds, or tanning booths. Check with your care team if you have severe diarrhea, nausea, and vomiting or if you sweat a lot. The loss of too much body fluid may make it dangerous for you to take this medication. This medication may cause serious skin reactions. They can happen weeks to months after starting the medication. Contact your care team right away if you notice fevers or flu-like symptoms with a rash. The rash may be red or purple and then turn into blisters or peeling of the skin. You may also notice a red rash with  swelling of the face, lips, or lymph nodes in your neck or under your arms. Talk to your care team if you may be pregnant. Serious birth defects can occur if you take this medication during pregnancy and for 2 months after the last dose. Contraception is recommended while taking this medication and for 2 months after the last dose. Your care team can help you find the option that works for you. Do not breastfeed while taking this medication and for 2 months after the last dose. This medication may cause infertility. Talk to your care team if you are concerned about your fertility. What side effects may I notice from receiving this medication? Side effects that you should report to your care team as soon as possible: Allergic reactions--skin rash, itching, hives, swelling of the face, lips, tongue, or throat Dry cough, shortness of breath or trouble breathing Eye pain, redness, irritation, or discharge with blurry or decreased vision Infusion reactions--chest pain, shortness of breath or trouble breathing, feeling faint or lightheaded Low magnesium level--muscle pain or cramps, unusual weakness or fatigue, fast or irregular heartbeat, tremors Low potassium level--muscle pain or cramps, unusual weakness or fatigue, fast or irregular heartbeat, constipation Redness, blistering, peeling, or loosening of   the skin, including inside the mouth Skin reactions on sun-exposed areas Side effects that usually do not require medical attention (report to your care team if they continue or are bothersome): Change in nail shape, thickness, or color Diarrhea Dry skin Fatigue Nausea Vomiting This list may not describe all possible side effects. Call your doctor for medical advice about side effects. You may report side effects to FDA at 1-800-FDA-1088. Where should I keep my medication? This medication is given in a hospital or clinic. It will not be stored at home. NOTE: This sheet is a summary. It may not  cover all possible information. If you have questions about this medicine, talk to your doctor, pharmacist, or health care provider.  2023 Elsevier/Gold Standard (2021-12-12 00:00:00) Hypomagnesemia Hypomagnesemia is a condition in which the level of magnesium in the blood is too low. Magnesium is a mineral that is found in many foods. It is used in many different processes in the body. Hypomagnesemia can affect every organ in the body. In severe cases, it can cause life-threatening problems. What are the causes? This condition may be caused by: Not getting enough magnesium in your diet or not having enough healthy foods to eat (malnutrition). Problems with magnesium absorption in the intestines. Dehydration. Excessive use of alcohol. Vomiting. Severe or long-term (chronic) diarrhea. Some medicines, including medicines that make you urinate more often (diuretics). Certain diseases, such as kidney disease, diabetes, celiac disease, and overactive thyroid. What are the signs or symptoms? Symptoms of this condition include: Loss of appetite, nausea, and vomiting. Involuntary shaking or trembling of a body part (tremor). Muscle weakness or tingling in the arms and legs. Sudden tightening of muscles (muscle spasms). Confusion. Psychiatric issues, such as: Depression and irritability. Psychosis. A feeling of fluttering of the heart (palpitations). Seizures. These symptoms are more severe if magnesium levels drop suddenly. How is this diagnosed? This condition may be diagnosed based on: Your symptoms and medical history. A physical exam. Blood and urine tests. How is this treated? Treatment depends on the cause and the severity of the condition. It may be treated by: Taking a magnesium supplement. This can be taken in pill form. If the condition is severe, magnesium is usually given through an IV. Making changes to your diet. You may be directed to eat foods that have a lot of magnesium,  such as green leafy vegetables, peas, beans, and nuts. Not drinking alcohol. If you are struggling not to drink, ask your health care provider for help. Follow these instructions at home: Eating and drinking     Make sure that your diet includes foods with magnesium. Foods that have a lot of magnesium in them include: Green leafy vegetables, such as spinach and broccoli. Beans and peas. Nuts and seeds, such as almonds and sunflower seeds. Whole grains, such as whole grain bread and fortified cereals. Drink fluids that contain salts and minerals (electrolytes), such as sports drinks, when you are active. Do not drink alcohol. General instructions Take over-the-counter and prescription medicines only as told by your health care provider. Take magnesium supplements as directed if your health care provider tells you to take them. Have your magnesium levels monitored as told by your health care provider. Keep all follow-up visits. This is important. Contact a health care provider if: You get worse instead of better. Your symptoms return. Get help right away if: You develop severe muscle weakness. You have trouble breathing. You feel that your heart is racing. These symptoms may represent a   serious problem that is an emergency. Do not wait to see if the symptoms will go away. Get medical help right away. Call your local emergency services (911 in the [place] Do not drive yourself to the hospital. Summary Hypomagnesemia is a condition in which the level of magnesium in the blood is too low. Hypomagnesemia can affect every organ in the body. Treatment may include eating more foods that contain magnesium, taking magnesium supplements, and not drinking alcohol. Have your magnesium levels monitored as told by your health care provider. This information is not intended to replace advice given to you by your health care provider. Make sure you discuss any questions you have with your health care  provider. Document Revised: 12/28/2020 Document Reviewed: 12/28/2020 Elsevier Patient Education  2023 Elsevier Inc.  

## 2022-12-22 NOTE — Progress Notes (Signed)
[facility] OFFICE PROGRESS NOTE   Diagnosis: Colon cancer  INTERVAL HISTORY:   Stacy Burns returns as scheduled.  She completed another treatment with panitumumab on 12/08/2022.  She continues encorafenib.  She has intermittent nosebleeding lasting 1 to 2 minutes.  The bleeding is always from the left nostril and usually occurs in the morning.  She had increased bruising over her right forearm after trauma when working on a window.  No other bleeding.  Objective:  Vital signs in last 24 hours:  Blood pressure (!) 146/62, pulse 79, temperature 98.1 F (36.7 C), temperature source Oral, resp. rate 18, height 5\' 7"  (1.702 m), weight 146 lb 12.8 oz (66.6 kg), SpO2 98 %.    HEENT: No thrush or ulcers.  No blood at the left nostril. Resp: Lungs clear bilaterally Cardio: Regular rate and rhythm GI: Nontender, no mass, no hepatosplenomegaly Vascular: No leg edema  Skin: Mild acne type rash over the face  Portacath/PICC-without erythema  Lab Results:  Lab Results  Component Value Date   WBC 5.3 12/22/2022   HGB 10.2 (L) 12/22/2022   HCT 31.2 (L) 12/22/2022   MCV 88.6 12/22/2022   PLT 198 12/22/2022   NEUTROABS 3.6 12/22/2022    CMP  Lab Results  Component Value Date   NA 140 12/08/2022   K 4.0 12/08/2022   CL 110 12/08/2022   CO2 21 (L) 12/08/2022   GLUCOSE 73 12/08/2022   BUN 26 (H) 12/08/2022   CREATININE 0.60 12/08/2022   CALCIUM 8.8 (L) 12/08/2022   PROT 6.3 (L) 12/08/2022   ALBUMIN 3.7 12/08/2022   AST 15 12/08/2022   ALT 10 12/08/2022   ALKPHOS 93 12/08/2022   BILITOT 0.3 12/08/2022   GFRNONAA >60 12/08/2022   GFRAA >60 05/06/2020    Lab Results  Component Value Date   CEA1 54.57 (H) 12/30/2020   CEA 17.62 (H) 12/08/2022     Medications: I have reviewed the patient's current medications.   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  Cirrhosis.  Innumerable liver lesions. Colonoscopy 12/18/2019-ulcerated partially obstructing large mass in the mid ascending colon.  Biopsy invasive adenocarcinoma; preserved expression major MMR proteins Upper endoscopy 12/18/2019-normal esophagus, stomach, examined duodenum CEA 12/19/2019-8,923 Biopsy liver lesion 12/23/2019-adenocarcinoma Foundation 1-K-ras wild-type; tumor mutation burden greater than or equal to 10; microsatellite stable; BRAF V600E Cycle 1 FOLFOX 01/01/2020 Cycle 2 FOLFOX 01/15/2020  Cycle 3 FOLFOX 01/29/2020, Udenyca added Cycle 4 FOLFOX 02/12/2020, Udenyca held Cycle 5 FOLFOX 02/26/2020, Udenyca Cycle 6 FOLFOX 03/11/2020, Udenyca held CT abdomen/pelvis 03/16/2020-stable diffuse liver lesions, stable strictured appearing ascending colon, possible pericolonic implant, vague left lower lobe nodule Cycle 1 FOLFIRI/Avastin 04/06/2020 Cycle 2 FOLFIRI/Avastin 04/21/2020 Cycle 3 FOLFIRI/Avastin 05/06/2020 Cycle 4 FOLFIRI/Avastin 05/20/2020 Cycle 5 FOLFIRI/Avastin 06/03/2020 CTs 06/15/2020-mild to moderate improvement in hepatic metastasis.  No new or progressive disease.  Narrowing within the proximal ascending colon with upstream mild cecal and terminal ileum dilatation, similar. Cycle 6 FOLFIRI/Avastin 06/17/2020 Cycle 7 FOLFIRI/Avastin 07/01/2020 Cycle 8 FOLFIRI/Avastin 07/15/2020 Cycle 9 FOLFIRI/Avastin 07/29/2020-5-FU bolus eliminated, 5-FU infusion and irinotecan dose reduced Cycle 10 FOLFIRI/Avastin 08/12/2020 CTs 08/23/2020- mild residual wall thickening involving the cecum/ascending colon.  Numerous liver metastases improved. Cycle 11 FOLFIRI/Avastin 08/25/2020 Cycle 12 FOLFIRI/Avastin 09/16/2020 Cycle 13 FOLFIRI/Avastin 10/07/2020 Cycle 14 FOLFIRI/Avastin 10/28/2020 Cycle 15 FOLFIRI/Avastin 11/18/2020 CT abdomen/pelvis 12/06/2020-mild improvement in multifocal hepatic metastases, wall thickening at the ascending colon with pericolonic inflammatory changes Cycle  16  FOLFIRI/Avastin 12/09/2020 Cycle 17 FOLFIRI/Avastin 12/30/2020 Cycle 18 FOLFIRI/Avastin 01/20/2021 Cycle 19 FOLFIRI/Avastin 02/10/2021 Cycle 20 FOLFIRI/Avastin 03/03/2021 Cycle 21 FOLFIRI/Avastin 03/24/2021 Cycle 22 FOLFIRI/Avastin 04/14/2021 CTs 05/04/2021-slight improvement in hepatic metastatic disease. Cycle 23 FOLFIRI/Avastin 05/05/2021 Cycle 24 FOLFIRI/Avastin 05/25/2021 Cycle 25 FOLFIRI/Avastin 06/15/2021 Cycle 26 FOLFIRI/Avastin 07/13/2021 Cycle 27 FOLFIRI/Avastin 08/02/2021 CTs 08/30/2021-majority of liver lesions are unchanged in size, 1 lesion adjacent to the gallbladder has increased, no adenopathy, persistent pericolonic stranding and peritoneal thickening at the cecum Progressive rise in CEA 08/31/2021-treatment changed to Xeloda/bevacizumab, began [place] 09/22/2021 CEA stable 11/03/2021 Xeloda/bevacizumab continued CTs 01/10/2022-increase in size of some of the liver lesions, others are stable, new tiny right lower lobe nodule, increased dilation of distal small bowel with increased wall thickening at the terminal ileum and ileocecal valve with 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, stable bilateral paracolic gutter peritoneal thickening Panitumumab/ Encorafenib 01/12/2022 (encorafenib 01/19/2022) Panitumumab 02/02/2022 Panitumumab 02/20/2022 Panitumumab 03/06/2022 Panitumumab 03/20/2022 Panitumumab 04/03/2022 CTs 04/14/2022-decrease size of liver metastases, new borderline subcarinal adenopathy, improved peritoneal thickening, cirrhosis or pseudocirrhosis, persistent dilated cecum Encorafenib and Panitumumab continued Panitumumab 04/18/2022 Panitumumab 05/05/2022 Panitumumab 05/19/2022 Panitumumab 06/02/2022 Panitumumab 06/16/2022, 4g IV mag and begin oral mag po once daily  Panitumumab 06/30/2022 Panitumumab 07/14/2022, 2 g IV magnesium, increase oral magnesium to 400 mg twice daily Panitumumab 07/28/2022, 2 g IV magnesium CTs 07/28/2022-no change in hepatic metastases, no new signs of  metastatic disease, persistent mural thickening at the terminal ileum/cecum mild splenomegaly unchanged Panitumumab 08/18/2022 Panitumumab 09/01/2022 Panitumumab 09/15/2022 Panitumumab 09/29/2022 Panitumumab 10/13/2022 Panitumumab 10/27/2022 CTs 11/03/2022-unchanged hepatic metastases, no new lesions, 3.7 cm soft tissue density at the medial left kidney etiology unclear, improvement in wall thickening at the ileocecal junction development of appendiceal dilatation Panitumumab 11/10/2022 Panitumumab 11/24/2022 Panitumumab 12/08/2022 Panitumumab 12/22/2022 Anemia secondary to GI blood loss, on oral iron BID  Cirrhosis 12/26/2019-hospital admission for right lower extremity DVT and GI bleed, treated with heparin anticoagulation beginning 12/26/2019, converted to Lovenox at discharge 12/29/2019 Lovenox reduced to 40 mg daily 02/10/2021 secondary to bruising and nosebleeding History of low-grade fever-likely "tumor" fever COVID-19 infection January 2021          Disposition: Ms. Steck appears stable.  She will continue panitumumab and encorafenib.  She will return for an office visit and panitumumab in 2 weeks.  We discussed an ENT referral for evaluation of the left nosebleeds.  She would like to wait on this for now.  Thornton Papas, MD  12/22/2022  8:20 AM

## 2022-12-22 NOTE — Progress Notes (Signed)
Patient seen by Dr. Truett Perna today  Vitals are within treatment parameters.  Labs reviewed by Dr. Truett Perna and are within treatment parameters. Does not need Mg+ today per MD  Per physician team, patient is ready for treatment and there are NO modifications to the treatment plan.

## 2022-12-22 NOTE — Progress Notes (Signed)
magi 

## 2023-01-05 ENCOUNTER — Inpatient Hospital Stay: Payer: BC Managed Care – PPO

## 2023-01-05 ENCOUNTER — Inpatient Hospital Stay: Payer: BC Managed Care – PPO | Admitting: Oncology

## 2023-01-05 ENCOUNTER — Encounter: Payer: Self-pay | Admitting: *Deleted

## 2023-01-05 DIAGNOSIS — C182 Malignant neoplasm of ascending colon: Secondary | ICD-10-CM

## 2023-01-05 DIAGNOSIS — C787 Secondary malignant neoplasm of liver and intrahepatic bile duct: Secondary | ICD-10-CM | POA: Diagnosis not present

## 2023-01-05 DIAGNOSIS — K7460 Unspecified cirrhosis of liver: Secondary | ICD-10-CM | POA: Diagnosis not present

## 2023-01-05 DIAGNOSIS — Z86718 Personal history of other venous thrombosis and embolism: Secondary | ICD-10-CM | POA: Diagnosis not present

## 2023-01-05 DIAGNOSIS — D500 Iron deficiency anemia secondary to blood loss (chronic): Secondary | ICD-10-CM | POA: Diagnosis not present

## 2023-01-05 DIAGNOSIS — Z5112 Encounter for antineoplastic immunotherapy: Secondary | ICD-10-CM | POA: Diagnosis not present

## 2023-01-05 LAB — CBC WITH DIFFERENTIAL (CANCER CENTER ONLY)
Abs Immature Granulocytes: 0.02 10*3/uL (ref 0.00–0.07)
Basophils Absolute: 0.1 10*3/uL (ref 0.0–0.1)
Basophils Relative: 1 %
Eosinophils Absolute: 0.2 10*3/uL (ref 0.0–0.5)
Eosinophils Relative: 4 %
HCT: 29.8 % — ABNORMAL LOW (ref 36.0–46.0)
Hemoglobin: 9.7 g/dL — ABNORMAL LOW (ref 12.0–15.0)
Immature Granulocytes: 0 %
Lymphocytes Relative: 10 %
Lymphs Abs: 0.5 10*3/uL — ABNORMAL LOW (ref 0.7–4.0)
MCH: 28.6 pg (ref 26.0–34.0)
MCHC: 32.6 g/dL (ref 30.0–36.0)
MCV: 87.9 fL (ref 80.0–100.0)
Monocytes Absolute: 0.6 10*3/uL (ref 0.1–1.0)
Monocytes Relative: 12 %
Neutro Abs: 4.1 10*3/uL (ref 1.7–7.7)
Neutrophils Relative %: 73 %
Platelet Count: 220 10*3/uL (ref 150–400)
RBC: 3.39 MIL/uL — ABNORMAL LOW (ref 3.87–5.11)
RDW: 14.4 % (ref 11.5–15.5)
WBC Count: 5.5 10*3/uL (ref 4.0–10.5)
nRBC: 0 % (ref 0.0–0.2)

## 2023-01-05 LAB — CMP (CANCER CENTER ONLY)
ALT: 10 U/L (ref 0–44)
AST: 14 U/L — ABNORMAL LOW (ref 15–41)
Albumin: 3.6 g/dL (ref 3.5–5.0)
Alkaline Phosphatase: 120 U/L (ref 38–126)
Anion gap: 7 (ref 5–15)
BUN: 24 mg/dL — ABNORMAL HIGH (ref 8–23)
CO2: 23 mmol/L (ref 22–32)
Calcium: 9 mg/dL (ref 8.9–10.3)
Chloride: 110 mmol/L (ref 98–111)
Creatinine: 0.75 mg/dL (ref 0.44–1.00)
GFR, Estimated: >60 mL/min (ref >60)
Glucose, Bld: 98 mg/dL (ref 70–99)
Potassium: 4 mmol/L (ref 3.5–5.1)
Sodium: 140 mmol/L (ref 135–145)
Total Bilirubin: 0.4 mg/dL (ref 0.3–1.2)
Total Protein: 6.8 g/dL (ref 6.5–8.1)

## 2023-01-05 LAB — CEA (ACCESS): CEA (CHCC): 17.65 ng/mL — ABNORMAL HIGH (ref 0.00–5.00)

## 2023-01-05 LAB — MAGNESIUM: Magnesium: 1.3 mg/dL — ABNORMAL LOW (ref 1.7–2.4)

## 2023-01-05 MED ORDER — MAGNESIUM SULFATE 2 GM/50ML IV SOLN
2.0000 g | Freq: Once | INTRAVENOUS | Status: AC
  Administered 2023-01-05: 2 g via INTRAVENOUS
  Filled 2023-01-05: qty 50

## 2023-01-05 MED ORDER — SODIUM CHLORIDE 0.9% FLUSH
10.0000 mL | INTRAVENOUS | Status: DC | PRN
  Administered 2023-01-05: 10 mL

## 2023-01-05 MED ORDER — SODIUM CHLORIDE 0.9 % IV SOLN: Freq: Once | INTRAVENOUS | Status: AC

## 2023-01-05 MED ORDER — HEPARIN SOD (PORK) LOCK FLUSH 100 UNIT/ML IV SOLN
500.0000 [IU] | Freq: Once | INTRAVENOUS | Status: AC | PRN
  Administered 2023-01-05: 500 [IU]

## 2023-01-05 MED ORDER — SODIUM CHLORIDE 0.9 % IV SOLN
6.0000 mg/kg | Freq: Once | INTRAVENOUS | Status: AC
  Administered 2023-01-05: 400 mg via INTRAVENOUS
  Filled 2023-01-05: qty 20

## 2023-01-05 NOTE — Patient Instructions (Signed)
[facility]   Discharge Instructions: Thank you for choosing [facility] to provide your oncology and hematology care.   If you have a lab appointment with the Cancer Center, please go directly to the Cancer Center and check in at the registration area.   Wear comfortable clothing and clothing appropriate for easy access to any Portacath or PICC line.   We strive to give you quality time with your provider. You may need to reschedule your appointment if you arrive late (15 or more minutes).  Arriving late affects you and other patients whose appointments are after yours.  Also, if you miss three or more appointments without notifying the office, you may be dismissed from the clinic at the provider's discretion.      For prescription refill requests, have your pharmacy contact our office and allow 72 hours for refills to be completed.    Today you received the following chemotherapy and/or immunotherapy agents Vectibix.      To help prevent nausea and vomiting after your treatment, we encourage you to take your nausea medication as directed.  BELOW ARE SYMPTOMS THAT SHOULD BE REPORTED IMMEDIATELY: *FEVER GREATER THAN 100.4 F (38 C) OR HIGHER *CHILLS OR SWEATING *NAUSEA AND VOMITING THAT IS NOT CONTROLLED WITH YOUR NAUSEA MEDICATION *UNUSUAL SHORTNESS OF BREATH *UNUSUAL BRUISING OR BLEEDING *URINARY PROBLEMS (pain or burning when urinating, or frequent urination) *BOWEL PROBLEMS (unusual diarrhea, constipation, pain near the anus) TENDERNESS IN MOUTH AND THROAT WITH OR WITHOUT PRESENCE OF ULCERS (sore throat, sores in mouth, or a toothache) UNUSUAL RASH, SWELLING OR PAIN  UNUSUAL VAGINAL DISCHARGE OR ITCHING   Items with * indicate a potential emergency and should be followed up as soon as possible or go to the Emergency Department if any problems should occur.  Please show the CHEMOTHERAPY ALERT CARD or IMMUNOTHERAPY ALERT CARD at  check-in to the Emergency Department and triage nurse.  Should you have questions after your visit or need to cancel or reschedule your appointment, please contact [facility]  Dept: [phone]  and follow the prompts.  Office hours are 8:00 a.m. to 4:30 p.m. Monday - Friday. Please note that voicemails left after 4:00 p.m. may not be returned until the following business day.  We are closed weekends and major holidays. You have access to a nurse at all times for urgent questions. Please call the main number to the clinic Dept: [phone] and follow the prompts.   For any non-urgent questions, you may also contact your provider using MyChart. We now offer e-Visits for anyone 18 and older to request care online for non-urgent symptoms. For details visit [email or web address].   Also download the MyChart app! Go to the app store, search "MyChart", open the app, select [place], and log in with your MyChart username and password.  Panitumumab Injection What is this medication? PANITUMUMAB (pan i TOOM ue mab) treats colorectal cancer. It works by blocking a protein that causes cancer cells to grow and multiply. This helps to slow or stop the spread of cancer cells. It is a monoclonal antibody. This medicine may be used for other purposes; ask your health care provider or pharmacist if you have questions. COMMON BRAND NAME(S): Vectibix What should I tell my care team before I take this medication? They need to know if you have any of these conditions: Eye disease Low levels of magnesium in the blood Lung disease An unusual or allergic reaction   to panitumumab, other medications, foods, dyes, or preservatives Pregnant or trying to get pregnant Breast-feeding How should I use this medication? This medication is injected into a vein. It is given by your care team in a hospital or clinic setting. Talk to your care team about the use of this medication in  children. Special care may be needed. Overdosage: If you think you have taken too much of this medicine contact a poison control center or emergency room at once. NOTE: This medicine is only for you. Do not share this medicine with others. What if I miss a dose? Keep appointments for follow-up doses. It is important not to miss your dose. Call your care team if you are unable to keep an appointment. What may interact with this medication? Bevacizumab This list may not describe all possible interactions. Give your health care provider a list of all the medicines, herbs, non-prescription drugs, or dietary supplements you use. Also tell them if you smoke, drink alcohol, or use illegal drugs. Some items may interact with your medicine. What should I watch for while using this medication? Your condition will be monitored carefully while you are receiving this medication. This medication may make you feel generally unwell. This is not uncommon as chemotherapy can affect healthy cells as well as cancer cells. Report any side effects. Continue your course of treatment even though you feel ill unless your care team tells you to stop. This medication can make you more sensitive to the sun. Keep out of the sun while receiving this medication and for 2 months after stopping therapy. If you cannot avoid being in the sun, wear protective clothing and sunscreen. Do not use sun lamps, tanning beds, or tanning booths. Check with your care team if you have severe diarrhea, nausea, and vomiting or if you sweat a lot. The loss of too much body fluid may make it dangerous for you to take this medication. This medication may cause serious skin reactions. They can happen weeks to months after starting the medication. Contact your care team right away if you notice fevers or flu-like symptoms with a rash. The rash may be red or purple and then turn into blisters or peeling of the skin. You may also notice a red rash with  swelling of the face, lips, or lymph nodes in your neck or under your arms. Talk to your care team if you may be pregnant. Serious birth defects can occur if you take this medication during pregnancy and for 2 months after the last dose. Contraception is recommended while taking this medication and for 2 months after the last dose. Your care team can help you find the option that works for you. Do not breastfeed while taking this medication and for 2 months after the last dose. This medication may cause infertility. Talk to your care team if you are concerned about your fertility. What side effects may I notice from receiving this medication? Side effects that you should report to your care team as soon as possible: Allergic reactions--skin rash, itching, hives, swelling of the face, lips, tongue, or throat Dry cough, shortness of breath or trouble breathing Eye pain, redness, irritation, or discharge with blurry or decreased vision Infusion reactions--chest pain, shortness of breath or trouble breathing, feeling faint or lightheaded Low magnesium level--muscle pain or cramps, unusual weakness or fatigue, fast or irregular heartbeat, tremors Low potassium level--muscle pain or cramps, unusual weakness or fatigue, fast or irregular heartbeat, constipation Redness, blistering, peeling, or loosening of   the skin, including inside the mouth Skin reactions on sun-exposed areas Side effects that usually do not require medical attention (report to your care team if they continue or are bothersome): Change in nail shape, thickness, or color Diarrhea Dry skin Fatigue Nausea Vomiting This list may not describe all possible side effects. Call your doctor for medical advice about side effects. You may report side effects to FDA at 1-800-FDA-1088. Where should I keep my medication? This medication is given in a hospital or clinic. It will not be stored at home. NOTE: This sheet is a summary. It may not  cover all possible information. If you have questions about this medicine, talk to your doctor, pharmacist, or health care provider.  2023 Elsevier/Gold Standard (2021-12-12 00:00:00) Hypomagnesemia Hypomagnesemia is a condition in which the level of magnesium in the blood is too low. Magnesium is a mineral that is found in many foods. It is used in many different processes in the body. Hypomagnesemia can affect every organ in the body. In severe cases, it can cause life-threatening problems. What are the causes? This condition may be caused by: Not getting enough magnesium in your diet or not having enough healthy foods to eat (malnutrition). Problems with magnesium absorption in the intestines. Dehydration. Excessive use of alcohol. Vomiting. Severe or long-term (chronic) diarrhea. Some medicines, including medicines that make you urinate more often (diuretics). Certain diseases, such as kidney disease, diabetes, celiac disease, and overactive thyroid. What are the signs or symptoms? Symptoms of this condition include: Loss of appetite, nausea, and vomiting. Involuntary shaking or trembling of a body part (tremor). Muscle weakness or tingling in the arms and legs. Sudden tightening of muscles (muscle spasms). Confusion. Psychiatric issues, such as: Depression and irritability. Psychosis. A feeling of fluttering of the heart (palpitations). Seizures. These symptoms are more severe if magnesium levels drop suddenly. How is this diagnosed? This condition may be diagnosed based on: Your symptoms and medical history. A physical exam. Blood and urine tests. How is this treated? Treatment depends on the cause and the severity of the condition. It may be treated by: Taking a magnesium supplement. This can be taken in pill form. If the condition is severe, magnesium is usually given through an IV. Making changes to your diet. You may be directed to eat foods that have a lot of magnesium,  such as green leafy vegetables, peas, beans, and nuts. Not drinking alcohol. If you are struggling not to drink, ask your health care provider for help. Follow these instructions at home: Eating and drinking     Make sure that your diet includes foods with magnesium. Foods that have a lot of magnesium in them include: Green leafy vegetables, such as spinach and broccoli. Beans and peas. Nuts and seeds, such as almonds and sunflower seeds. Whole grains, such as whole grain bread and fortified cereals. Drink fluids that contain salts and minerals (electrolytes), such as sports drinks, when you are active. Do not drink alcohol. General instructions Take over-the-counter and prescription medicines only as told by your health care provider. Take magnesium supplements as directed if your health care provider tells you to take them. Have your magnesium levels monitored as told by your health care provider. Keep all follow-up visits. This is important. Contact a health care provider if: You get worse instead of better. Your symptoms return. Get help right away if: You develop severe muscle weakness. You have trouble breathing. You feel that your heart is racing. These symptoms may represent a   serious problem that is an emergency. Do not wait to see if the symptoms will go away. Get medical help right away. Call your local emergency services (911 in the [place] Do not drive yourself to the hospital. Summary Hypomagnesemia is a condition in which the level of magnesium in the blood is too low. Hypomagnesemia can affect every organ in the body. Treatment may include eating more foods that contain magnesium, taking magnesium supplements, and not drinking alcohol. Have your magnesium levels monitored as told by your health care provider. This information is not intended to replace advice given to you by your health care provider. Make sure you discuss any questions you have with your health care  provider. Document Revised: 12/28/2020 Document Reviewed: 12/28/2020 Elsevier Patient Education  2023 Elsevier Inc.  

## 2023-01-05 NOTE — Patient Instructions (Signed)

## 2023-01-05 NOTE — Progress Notes (Signed)
[facility] OFFICE PROGRESS NOTE   Diagnosis: Colon cancer  INTERVAL HISTORY:   Stacy Burns completed another cycle of panitumumab on 12/22/2022.  She continues encorafenib.  No diarrhea.  She has a mild rash over the face.  No sores over the fingers or toes.  No abdominal pain.  She continues Lovenox anticoagulation.  Objective:  Vital signs in last 24 hours:  Blood pressure (!) 116/58, pulse 93, temperature 98.2 F (36.8 C), temperature source Oral, resp. rate 18, height 5\' 7"  (1.702 m), weight 144 lb 1.6 oz (65.4 kg), SpO2 99 %.    HEENT: No thrush or ulcers Resp: Lungs clear bilaterally Cardio: Regular rate and rhythm GI: Soft and nontender, no mass, no hepatosplenomegaly Vascular: No leg edema  Skin: Mild paronychia at several toenails, dryness over the back, few acne type lesions over the face, ecchymoses at the distal arms  Portacath/PICC-without erythema  Lab Results:  Lab Results  Component Value Date   WBC 5.5 01/05/2023   HGB 9.7 (L) 01/05/2023   HCT 29.8 (L) 01/05/2023   MCV 87.9 01/05/2023   PLT 220 01/05/2023   NEUTROABS 4.1 01/05/2023    CMP  Lab Results  Component Value Date   NA 138 12/22/2022   K 5.0 12/22/2022   CL 107 12/22/2022   CO2 24 12/22/2022   GLUCOSE 90 12/22/2022   BUN 23 12/22/2022   CREATININE 0.73 12/22/2022   CALCIUM 9.1 12/22/2022   PROT 6.7 12/22/2022   ALBUMIN 3.8 12/22/2022   AST 17 12/22/2022   ALT 12 12/22/2022   ALKPHOS 118 12/22/2022   BILITOT 0.4 12/22/2022   GFRNONAA >60 12/22/2022   GFRAA >60 05/06/2020    Lab Results  Component Value Date   CEA1 54.57 (H) 12/30/2020   CEA 18.04 (H) 12/22/2022    Medications: I have reviewed the patient's current medications.   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  Cirrhosis.  Innumerable liver lesions. Colonoscopy  12/18/2019-ulcerated partially obstructing large mass in the mid ascending colon.  Biopsy invasive adenocarcinoma; preserved expression major MMR proteins Upper endoscopy 12/18/2019-normal esophagus, stomach, examined duodenum CEA 12/19/2019-8,923 Biopsy liver lesion 12/23/2019-adenocarcinoma Foundation 1-K-ras wild-type; tumor mutation burden greater than or equal to 10; microsatellite stable; BRAF V600E Cycle 1 FOLFOX 01/01/2020 Cycle 2 FOLFOX 01/15/2020  Cycle 3 FOLFOX 01/29/2020, Udenyca added Cycle 4 FOLFOX 02/12/2020, Udenyca held Cycle 5 FOLFOX 02/26/2020, Udenyca Cycle 6 FOLFOX 03/11/2020, Udenyca held CT abdomen/pelvis 03/16/2020-stable diffuse liver lesions, stable strictured appearing ascending colon, possible pericolonic implant, vague left lower lobe nodule Cycle 1 FOLFIRI/Avastin 04/06/2020 Cycle 2 FOLFIRI/Avastin 04/21/2020 Cycle 3 FOLFIRI/Avastin 05/06/2020 Cycle 4 FOLFIRI/Avastin 05/20/2020 Cycle 5 FOLFIRI/Avastin 06/03/2020 CTs 06/15/2020-mild to moderate improvement in hepatic metastasis.  No new or progressive disease.  Narrowing within the proximal ascending colon with upstream mild cecal and terminal ileum dilatation, similar. Cycle 6 FOLFIRI/Avastin 06/17/2020 Cycle 7 FOLFIRI/Avastin 07/01/2020 Cycle 8 FOLFIRI/Avastin 07/15/2020 Cycle 9 FOLFIRI/Avastin 07/29/2020-5-FU bolus eliminated, 5-FU infusion and irinotecan dose reduced Cycle 10 FOLFIRI/Avastin 08/12/2020 CTs 08/23/2020- mild residual wall thickening involving the cecum/ascending colon.  Numerous liver metastases improved. Cycle 11 FOLFIRI/Avastin 08/25/2020 Cycle 12 FOLFIRI/Avastin 09/16/2020 Cycle 13 FOLFIRI/Avastin 10/07/2020 Cycle 14 FOLFIRI/Avastin 10/28/2020 Cycle 15 FOLFIRI/Avastin 11/18/2020 CT abdomen/pelvis 12/06/2020-mild improvement in multifocal hepatic metastases, wall thickening at the ascending colon with pericolonic inflammatory changes Cycle 16 FOLFIRI/Avastin 12/09/2020 Cycle 17 FOLFIRI/Avastin 12/30/2020 Cycle 18  FOLFIRI/Avastin 01/20/2021 Cycle 19 FOLFIRI/Avastin 02/10/2021 Cycle  20 FOLFIRI/Avastin 03/03/2021 Cycle 21 FOLFIRI/Avastin 03/24/2021 Cycle 22 FOLFIRI/Avastin 04/14/2021 CTs 05/04/2021-slight improvement in hepatic metastatic disease. Cycle 23 FOLFIRI/Avastin 05/05/2021 Cycle 24 FOLFIRI/Avastin 05/25/2021 Cycle 25 FOLFIRI/Avastin 06/15/2021 Cycle 26 FOLFIRI/Avastin 07/13/2021 Cycle 27 FOLFIRI/Avastin 08/02/2021 CTs 08/30/2021-majority of liver lesions are unchanged in size, 1 lesion adjacent to the gallbladder has increased, no adenopathy, persistent pericolonic stranding and peritoneal thickening at the cecum Progressive rise in CEA 08/31/2021-treatment changed to Xeloda/bevacizumab, began [place] 09/22/2021 CEA stable 11/03/2021 Xeloda/bevacizumab continued CTs 01/10/2022-increase in size of some of the liver lesions, others are stable, new tiny right lower lobe nodule, increased dilation of distal small bowel with increased wall thickening at the terminal ileum and ileocecal valve with 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, stable bilateral paracolic gutter peritoneal thickening Panitumumab/ Encorafenib 01/12/2022 (encorafenib 01/19/2022) Panitumumab 02/02/2022 Panitumumab 02/20/2022 Panitumumab 03/06/2022 Panitumumab 03/20/2022 Panitumumab 04/03/2022 CTs 04/14/2022-decrease size of liver metastases, new borderline subcarinal adenopathy, improved peritoneal thickening, cirrhosis or pseudocirrhosis, persistent dilated cecum Encorafenib and Panitumumab continued Panitumumab 04/18/2022 Panitumumab 05/05/2022 Panitumumab 05/19/2022 Panitumumab 06/02/2022 Panitumumab 06/16/2022, 4g IV mag and begin oral mag po once daily  Panitumumab 06/30/2022 Panitumumab 07/14/2022, 2 g IV magnesium, increase oral magnesium to 400 mg twice daily Panitumumab 07/28/2022, 2 g IV magnesium CTs 07/28/2022-no change in hepatic metastases, no new signs of metastatic disease, persistent mural thickening at the terminal  ileum/cecum mild splenomegaly unchanged Panitumumab 08/18/2022 Panitumumab 09/01/2022 Panitumumab 09/15/2022 Panitumumab 09/29/2022 Panitumumab 10/13/2022 Panitumumab 10/27/2022 CTs 11/03/2022-unchanged hepatic metastases, no new lesions, 3.7 cm soft tissue density at the medial left kidney etiology unclear, improvement in wall thickening at the ileocecal junction development of appendiceal dilatation Panitumumab 11/10/2022 Panitumumab 11/24/2022 Panitumumab 12/08/2022 Panitumumab 12/22/2022 Panitumumab 01/05/2023 Anemia secondary to GI blood loss, on oral iron BID  Cirrhosis 12/26/2019-hospital admission for right lower extremity DVT and GI bleed, treated with heparin anticoagulation beginning 12/26/2019, converted to Lovenox at discharge 12/29/2019 Lovenox reduced to 40 mg daily 02/10/2021 secondary to bruising and nosebleeding History of low-grade fever-likely "tumor" fever COVID-19 infection January 2021         Disposition: Ms. Kahrs appears stable.  There is no clinical evidence for progression of the metastatic colon cancer.  The plan is to obtain a restaging CT in June or July.  She will continue encorafenib and every 2-week panitumumab.  She will receive intravenous magnesium supplementation today.  Ms. Bloemer will return for an office visit and panitumumab in 2 weeks.  Thornton Papas, MD  01/05/2023  8:54 AM

## 2023-01-05 NOTE — Progress Notes (Signed)
Patient seen by Dr. Truett Perna today  Vitals are within treatment parameters.  Labs reviewed by Dr. Truett Perna and are not all within treatment parameters. Mg 1.3--OK to treat  Per physician team, patient is ready for treatment. Please note that modifications are being made to the treatment plan including Administer 2 grams IV Mg+ today.

## 2023-01-06 ENCOUNTER — Other Ambulatory Visit: Payer: Self-pay

## 2023-01-10 ENCOUNTER — Other Ambulatory Visit: Payer: Self-pay | Admitting: Nurse Practitioner

## 2023-01-10 DIAGNOSIS — C182 Malignant neoplasm of ascending colon: Secondary | ICD-10-CM

## 2023-01-11 ENCOUNTER — Other Ambulatory Visit: Payer: Self-pay | Admitting: Oncology

## 2023-01-11 DIAGNOSIS — C182 Malignant neoplasm of ascending colon: Secondary | ICD-10-CM

## 2023-01-19 ENCOUNTER — Telehealth: Payer: Self-pay

## 2023-01-19 ENCOUNTER — Inpatient Hospital Stay: Payer: BC Managed Care – PPO

## 2023-01-19 ENCOUNTER — Ambulatory Visit (HOSPITAL_BASED_OUTPATIENT_CLINIC_OR_DEPARTMENT_OTHER)
Admission: RE | Admit: 2023-01-19 | Discharge: 2023-01-19 | Disposition: A | Discharge status: Home / Self Care | Payer: BC Managed Care – PPO | Source: Ambulatory Visit | Attending: Nurse Practitioner | Admitting: Nurse Practitioner

## 2023-01-19 ENCOUNTER — Encounter: Payer: Self-pay | Admitting: Nurse Practitioner

## 2023-01-19 ENCOUNTER — Inpatient Hospital Stay: Payer: BC Managed Care – PPO | Attending: Oncology

## 2023-01-19 ENCOUNTER — Other Ambulatory Visit: Payer: Self-pay

## 2023-01-19 ENCOUNTER — Encounter (HOSPITAL_COMMUNITY): Payer: Self-pay

## 2023-01-19 ENCOUNTER — Observation Stay (HOSPITAL_COMMUNITY)
Admission: EM | Admit: 2023-01-19 | Discharge: 2023-01-22 | Disposition: A | Discharge status: Home / Self Care | Payer: BC Managed Care – PPO | Attending: Internal Medicine | Admitting: Internal Medicine

## 2023-01-19 ENCOUNTER — Emergency Department (HOSPITAL_COMMUNITY): Payer: BC Managed Care – PPO

## 2023-01-19 ENCOUNTER — Inpatient Hospital Stay: Payer: BC Managed Care – PPO | Admitting: Nurse Practitioner

## 2023-01-19 VITALS — BP 136/54 | HR 93 | Temp 98.1°F | Resp 16 | Ht 67.0 in | Wt 144.2 lb

## 2023-01-19 DIAGNOSIS — Z452 Encounter for adjustment and management of vascular access device: Secondary | ICD-10-CM | POA: Insufficient documentation

## 2023-01-19 DIAGNOSIS — C182 Malignant neoplasm of ascending colon: Secondary | ICD-10-CM

## 2023-01-19 DIAGNOSIS — D649 Anemia, unspecified: Secondary | ICD-10-CM | POA: Insufficient documentation

## 2023-01-19 DIAGNOSIS — J90 Pleural effusion, not elsewhere classified: Secondary | ICD-10-CM | POA: Diagnosis not present

## 2023-01-19 DIAGNOSIS — R0600 Dyspnea, unspecified: Secondary | ICD-10-CM | POA: Diagnosis not present

## 2023-01-19 DIAGNOSIS — Z86718 Personal history of other venous thrombosis and embolism: Secondary | ICD-10-CM | POA: Diagnosis not present

## 2023-01-19 DIAGNOSIS — R0602 Shortness of breath: Secondary | ICD-10-CM | POA: Diagnosis not present

## 2023-01-19 DIAGNOSIS — Z87891 Personal history of nicotine dependence: Secondary | ICD-10-CM | POA: Diagnosis not present

## 2023-01-19 DIAGNOSIS — R079 Chest pain, unspecified: Secondary | ICD-10-CM | POA: Diagnosis not present

## 2023-01-19 DIAGNOSIS — Z8616 Personal history of COVID-19: Secondary | ICD-10-CM | POA: Insufficient documentation

## 2023-01-19 DIAGNOSIS — D6481 Anemia due to antineoplastic chemotherapy: Secondary | ICD-10-CM | POA: Insufficient documentation

## 2023-01-19 DIAGNOSIS — Z1152 Encounter for screening for COVID-19: Secondary | ICD-10-CM | POA: Diagnosis not present

## 2023-01-19 DIAGNOSIS — R072 Precordial pain: Secondary | ICD-10-CM

## 2023-01-19 DIAGNOSIS — Z85038 Personal history of other malignant neoplasm of large intestine: Secondary | ICD-10-CM | POA: Diagnosis not present

## 2023-01-19 DIAGNOSIS — I700 Atherosclerosis of aorta: Secondary | ICD-10-CM | POA: Insufficient documentation

## 2023-01-19 DIAGNOSIS — I309 Acute pericarditis, unspecified: Secondary | ICD-10-CM

## 2023-01-19 DIAGNOSIS — I3139 Other pericardial effusion (noninflammatory): Secondary | ICD-10-CM | POA: Insufficient documentation

## 2023-01-19 DIAGNOSIS — C189 Malignant neoplasm of colon, unspecified: Secondary | ICD-10-CM

## 2023-01-19 DIAGNOSIS — D500 Iron deficiency anemia secondary to blood loss (chronic): Secondary | ICD-10-CM | POA: Insufficient documentation

## 2023-01-19 DIAGNOSIS — C787 Secondary malignant neoplasm of liver and intrahepatic bile duct: Secondary | ICD-10-CM | POA: Insufficient documentation

## 2023-01-19 DIAGNOSIS — J9811 Atelectasis: Secondary | ICD-10-CM | POA: Diagnosis not present

## 2023-01-19 DIAGNOSIS — K7460 Unspecified cirrhosis of liver: Secondary | ICD-10-CM | POA: Insufficient documentation

## 2023-01-19 DIAGNOSIS — Z5112 Encounter for antineoplastic immunotherapy: Secondary | ICD-10-CM | POA: Insufficient documentation

## 2023-01-19 LAB — CBC WITH DIFFERENTIAL (CANCER CENTER ONLY)
Abs Immature Granulocytes: 0.04 10*3/uL (ref 0.00–0.07)
Basophils Absolute: 0 10*3/uL (ref 0.0–0.1)
Basophils Relative: 1 %
Eosinophils Absolute: 0.2 10*3/uL (ref 0.0–0.5)
Eosinophils Relative: 3 %
HCT: 26.9 % — ABNORMAL LOW (ref 36.0–46.0)
Hemoglobin: 8.7 g/dL — ABNORMAL LOW (ref 12.0–15.0)
Immature Granulocytes: 1 %
Lymphocytes Relative: 13 %
Lymphs Abs: 0.9 10*3/uL (ref 0.7–4.0)
MCH: 28.2 pg (ref 26.0–34.0)
MCHC: 32.3 g/dL (ref 30.0–36.0)
MCV: 87.1 fL (ref 80.0–100.0)
Monocytes Absolute: 0.8 10*3/uL (ref 0.1–1.0)
Monocytes Relative: 11 %
Neutro Abs: 5.1 10*3/uL (ref 1.7–7.7)
Neutrophils Relative %: 71 %
Platelet Count: 256 10*3/uL (ref 150–400)
RBC: 3.09 MIL/uL — ABNORMAL LOW (ref 3.87–5.11)
RDW: 14.6 % (ref 11.5–15.5)
WBC Count: 7 10*3/uL (ref 4.0–10.5)
nRBC: 0 % (ref 0.0–0.2)

## 2023-01-19 LAB — CBC WITH DIFFERENTIAL/PLATELET
Abs Immature Granulocytes: 0.02 10*3/uL (ref 0.00–0.07)
Basophils Absolute: 0 10*3/uL (ref 0.0–0.1)
Basophils Relative: 0 %
Eosinophils Absolute: 0.2 10*3/uL (ref 0.0–0.5)
Eosinophils Relative: 3 %
HCT: 27.2 % — ABNORMAL LOW (ref 36.0–46.0)
Hemoglobin: 8.4 g/dL — ABNORMAL LOW (ref 12.0–15.0)
Immature Granulocytes: 0 %
Lymphocytes Relative: 13 %
Lymphs Abs: 0.9 10*3/uL (ref 0.7–4.0)
MCH: 27.4 pg (ref 26.0–34.0)
MCHC: 30.9 g/dL (ref 30.0–36.0)
MCV: 88.6 fL (ref 80.0–100.0)
Monocytes Absolute: 0.9 10*3/uL (ref 0.1–1.0)
Monocytes Relative: 14 %
Neutro Abs: 4.8 10*3/uL (ref 1.7–7.7)
Neutrophils Relative %: 70 %
Platelets: 231 10*3/uL (ref 150–400)
RBC: 3.07 MIL/uL — ABNORMAL LOW (ref 3.87–5.11)
RDW: 14.5 % (ref 11.5–15.5)
WBC: 6.9 10*3/uL (ref 4.0–10.5)
nRBC: 0 % (ref 0.0–0.2)

## 2023-01-19 LAB — TROPONIN I (HIGH SENSITIVITY)
Troponin I (High Sensitivity): 4 ng/L (ref <18)
Troponin I (High Sensitivity): 4 ng/L (ref <18)

## 2023-01-19 LAB — COMPREHENSIVE METABOLIC PANEL
ALT: 11 U/L (ref 0–44)
AST: 12 U/L — ABNORMAL LOW (ref 15–41)
Albumin: 2.6 g/dL — ABNORMAL LOW (ref 3.5–5.0)
Alkaline Phosphatase: 101 U/L (ref 38–126)
Anion gap: 11 (ref 5–15)
BUN: 22 mg/dL (ref 8–23)
CO2: 21 mmol/L — ABNORMAL LOW (ref 22–32)
Calcium: 8.8 mg/dL — ABNORMAL LOW (ref 8.9–10.3)
Chloride: 108 mmol/L (ref 98–111)
Creatinine, Ser: 0.8 mg/dL (ref 0.44–1.00)
GFR, Estimated: >60 mL/min (ref >60)
Glucose, Bld: 91 mg/dL (ref 70–99)
Potassium: 4.2 mmol/L (ref 3.5–5.1)
Sodium: 140 mmol/L (ref 135–145)
Total Bilirubin: 0.6 mg/dL (ref 0.3–1.2)
Total Protein: 6 g/dL — ABNORMAL LOW (ref 6.5–8.1)

## 2023-01-19 LAB — MAGNESIUM: Magnesium: 1.7 mg/dL (ref 1.7–2.4)

## 2023-01-19 LAB — CMP (CANCER CENTER ONLY)
ALT: 7 U/L (ref 0–44)
AST: 10 U/L — ABNORMAL LOW (ref 15–41)
Albumin: 3.4 g/dL — ABNORMAL LOW (ref 3.5–5.0)
Alkaline Phosphatase: 102 U/L (ref 38–126)
Anion gap: 8 (ref 5–15)
BUN: 28 mg/dL — ABNORMAL HIGH (ref 8–23)
CO2: 22 mmol/L (ref 22–32)
Calcium: 9 mg/dL (ref 8.9–10.3)
Chloride: 109 mmol/L (ref 98–111)
Creatinine: 0.76 mg/dL (ref 0.44–1.00)
GFR, Estimated: >60 mL/min (ref >60)
Glucose, Bld: 89 mg/dL (ref 70–99)
Potassium: 4.6 mmol/L (ref 3.5–5.1)
Sodium: 139 mmol/L (ref 135–145)
Total Bilirubin: 0.4 mg/dL (ref 0.3–1.2)
Total Protein: 6.5 g/dL (ref 6.5–8.1)

## 2023-01-19 LAB — RESP PANEL BY RT-PCR (RSV, FLU A&B, COVID)  RVPGX2
Influenza A by PCR: NEGATIVE
Influenza B by PCR: NEGATIVE
Resp Syncytial Virus by PCR: NEGATIVE
SARS Coronavirus 2 by RT PCR: NEGATIVE

## 2023-01-19 LAB — CEA (ACCESS): CEA (CHCC): 18.27 ng/mL — ABNORMAL HIGH (ref 0.00–5.00)

## 2023-01-19 LAB — POC OCCULT BLOOD, ED: Fecal Occult Bld: POSITIVE — AB

## 2023-01-19 LAB — BRAIN NATRIURETIC PEPTIDE: B Natriuretic Peptide: 114.9 pg/mL — ABNORMAL HIGH (ref 0.0–100.0)

## 2023-01-19 MED ORDER — OXYCODONE-ACETAMINOPHEN 5-325 MG PO TABS: 1.0000 | ORAL_TABLET | Freq: Three times a day (TID) | ORAL | Status: DC | PRN

## 2023-01-19 MED ORDER — POTASSIUM CHLORIDE CRYS ER 10 MEQ PO TBCR
10.0000 meq | EXTENDED_RELEASE_TABLET | Freq: Two times a day (BID) | ORAL | Status: DC
  Administered 2023-01-20 – 2023-01-22 (×5): 10 meq via ORAL
  Filled 2023-01-19 (×5): qty 1

## 2023-01-19 MED ORDER — LIDOCAINE-PRILOCAINE 2.5-2.5 % EX CREA
1.0000 | TOPICAL_CREAM | CUTANEOUS | 0 refills | Status: DC | PRN
Start: 2023-01-19 — End: 2023-11-07

## 2023-01-19 MED ORDER — CHLORHEXIDINE GLUCONATE CLOTH 2 % EX PADS
6.0000 | MEDICATED_PAD | Freq: Every day | CUTANEOUS | Status: DC
  Administered 2023-01-20 – 2023-01-22 (×3): 6 via TOPICAL

## 2023-01-19 MED ORDER — COLCHICINE 0.6 MG PO TABS
0.6000 mg | ORAL_TABLET | Freq: Every day | ORAL | Status: DC
  Administered 2023-01-20 (×2): 0.6 mg via ORAL
  Filled 2023-01-19 (×2): qty 1

## 2023-01-19 MED ORDER — ALBUTEROL SULFATE (2.5 MG/3ML) 0.083% IN NEBU: 3.0000 mL | INHALATION_SOLUTION | RESPIRATORY_TRACT | Status: DC | PRN

## 2023-01-19 MED ORDER — MAGNESIUM OXIDE -MG SUPPLEMENT 400 (240 MG) MG PO TABS
400.0000 mg | ORAL_TABLET | Freq: Two times a day (BID) | ORAL | 2 refills | Status: DC
Start: 2023-01-19 — End: 2023-04-30

## 2023-01-19 MED ORDER — COLCHICINE 0.6 MG PO TABS: 0.6000 mg | ORAL_TABLET | Freq: Two times a day (BID) | ORAL | Status: DC

## 2023-01-19 MED ORDER — IOHEXOL 350 MG/ML SOLN
100.0000 mL | Freq: Once | INTRAVENOUS | Status: AC | PRN
  Administered 2023-01-19: 80 mL via INTRAVENOUS

## 2023-01-19 MED ORDER — MAGNESIUM OXIDE -MG SUPPLEMENT 400 (240 MG) MG PO TABS
400.0000 mg | ORAL_TABLET | Freq: Two times a day (BID) | ORAL | Status: DC
  Administered 2023-01-20 – 2023-01-22 (×6): 400 mg via ORAL
  Filled 2023-01-19 (×6): qty 1

## 2023-01-19 MED ORDER — ACETAMINOPHEN 325 MG PO TABS
650.0000 mg | ORAL_TABLET | Freq: Four times a day (QID) | ORAL | Status: DC | PRN
  Filled 2023-01-19: qty 2

## 2023-01-19 MED ORDER — ONDANSETRON HCL 4 MG PO TABS: 4.0000 mg | ORAL_TABLET | Freq: Four times a day (QID) | ORAL | Status: DC | PRN

## 2023-01-19 MED ORDER — ACETAMINOPHEN 650 MG RE SUPP: 650.0000 mg | Freq: Four times a day (QID) | RECTAL | Status: DC | PRN

## 2023-01-19 MED ORDER — FERROUS SULFATE 325 (65 FE) MG PO TABS
325.0000 mg | ORAL_TABLET | Freq: Every day | ORAL | Status: DC
  Administered 2023-01-20 – 2023-01-22 (×3): 325 mg via ORAL
  Filled 2023-01-19 (×3): qty 1

## 2023-01-19 MED ORDER — ONDANSETRON HCL 4 MG/2ML IJ SOLN: 4.0000 mg | Freq: Four times a day (QID) | INTRAMUSCULAR | Status: DC | PRN

## 2023-01-19 NOTE — Assessment & Plan Note (Addendum)
Pt with chronic anemia, hemoccult positive, likely in setting of her known colon cancer not having achieved remission. Last took lovenox this AM  Takes lovenox chronically for h/o DVT back around 2021 when she was first diagnosed with colon CA. Ill hold off on re-ordering lovenox for the moment, at least until heme/onc + day team can decide if further GIB work up is needed (or if this just represents ongoing bleeding from active colon cancer as I suspect). Not due for next lovenox dose until tomorrow AM anyhow. Getting repeat CBC in AM

## 2023-01-19 NOTE — ED Notes (Signed)
ED TO INPATIENT HANDOFF REPORT  ED Nurse Name and Phone #: Grant Fontana PM [phone]  S Name/Age/Gender Earney Navy 77 y.o. female Room/Bed: 035C/035C  Code Status   Code Status: Prior  Home/SNF/Other Home Patient oriented to: self, place, time, and situation Is this baseline? Yes   Triage Complete: Triage complete  Chief Complaint Pericardial effusion [I31.39]  Triage Note PT BIB [facility] from [facility], for SOB and fluid around her heart.  Talking in full sentences and vitals are stable. [facility] scanned her there. Port is accessed. AOX4.  Respirations 22 HR 80's Hgb 8.8   Allergies Allergies  Allergen Reactions   Milk-Related Compounds Nausea Only and Other (See Comments)    Delayed reaction if too much consumed    Penicillins Anaphylaxis and Other (See Comments)    Both parents had anaphylactic reactions to this   Pineapple Swelling and Other (See Comments)    Tongue swells and causes acid bumps there, also   Chocolate Other (See Comments)    Nasal congestion   Hydrocodone Nausea And Vomiting   Other Itching, Rash and Other (See Comments)    "Most all antibiotics and OTC cold meds break me out" Allergic to ALL NUTS, also = Itching   Sulfa Antibiotics Rash and Other (See Comments)    Severe rash    Level of Care/Admitting Diagnosis ED Disposition     ED Disposition  Admit   Condition  --   Comment  Hospital Area: [facility] [[id number]]  Level of Care: Telemetry Cardiac [[id number]]  May place patient in observation at [facility] or [facility] if equivalent level of care is available:: No  Covid Evaluation: Asymptomatic - no recent exposure (last 10 days) testing not required  Diagnosis: Pericardial effusion [203045]  Admitting Physician: Hillary Bow [[id number]]  Attending Physician: Hillary Bow [[id number]]          B Medical/Surgery History Past Medical History:  Diagnosis Date   colon ca dx'd 12/2019   COVID-19  virus infection 08/2019   not hospitalized.     Severe anemia 12/17/2019   Past Surgical History:  Procedure Laterality Date   BIOPSY  12/18/2019   Procedure: BIOPSY;  Surgeon: Tressia Danas, MD;  Location: [facility] ENDOSCOPY;  Service: Gastroenterology;;   COLONOSCOPY WITH PROPOFOL N/A 12/18/2019   Procedure: COLONOSCOPY WITH PROPOFOL;  Surgeon: Tressia Danas, MD;  Location: [facility] ENDOSCOPY;  Service: Gastroenterology;  Laterality: N/A;   ESOPHAGOGASTRODUODENOSCOPY (EGD) WITH PROPOFOL N/A 12/18/2019   Procedure: ESOPHAGOGASTRODUODENOSCOPY (EGD) WITH PROPOFOL;  Surgeon: Tressia Danas, MD;  Location: [facility] ENDOSCOPY;  Service: Gastroenterology;  Laterality: N/A;   PORTACATH PLACEMENT N/A 12/19/2019   Procedure: INSERTION PORT-A-CATH WITH ULTRASOUND;  Surgeon: Emelia Loron, MD;  Location: [facility] OR;  Service: General;  Laterality: N/A;   SUBMUCOSAL TATTOO INJECTION  12/18/2019   Procedure: SUBMUCOSAL TATTOO INJECTION;  Surgeon: Tressia Danas, MD;  Location: [facility] ENDOSCOPY;  Service: Gastroenterology;;   TUBAL LIGATION       A IV Location/Drains/Wounds Patient Lines/Drains/Airways Status     Active Line/Drains/Airways     Name Placement date Placement time Site Days   Implanted Port 12/19/19 Right Chest 12/19/19  1311  Chest  1127            Intake/Output Last 24 hours No intake or output data in the 24 hours ending 01/19/23 2215  Labs/Imaging Results for orders placed or performed during the hospital encounter of 01/19/23 (from the past 48 hour(s))  CBC with  Differential     Status: Abnormal   Collection Time: 01/19/23  7:22 PM  Result Value Ref Range   WBC 6.9 4.0 - 10.5 K/uL   RBC 3.07 (L) 3.87 - 5.11 MIL/uL   Hemoglobin 8.4 (L) 12.0 - 15.0 g/dL   HCT 16.1 (L) 09.6 - 04.5 %   MCV 88.6 80.0 - 100.0 fL   MCH 27.4 26.0 - 34.0 pg   MCHC 30.9 30.0 - 36.0 g/dL   RDW 40.9 81.1 - 91.4 %   Platelets 231 150 - 400 K/uL   nRBC 0.0 0.0 - 0.2 %   Neutrophils Relative % 70 %   Neutro  Abs 4.8 1.7 - 7.7 K/uL   Lymphocytes Relative 13 %   Lymphs Abs 0.9 0.7 - 4.0 K/uL   Monocytes Relative 14 %   Monocytes Absolute 0.9 0.1 - 1.0 K/uL   Eosinophils Relative 3 %   Eosinophils Absolute 0.2 0.0 - 0.5 K/uL   Basophils Relative 0 %   Basophils Absolute 0.0 0.0 - 0.1 K/uL   Immature Granulocytes 0 %   Abs Immature Granulocytes 0.02 0.00 - 0.07 K/uL    Comment: Performed at [facility], [address] [place], [place] [address]  Comprehensive metabolic panel     Status: Abnormal   Collection Time: 01/19/23  7:22 PM  Result Value Ref Range   Sodium 140 135 - 145 mmol/L   Potassium 4.2 3.5 - 5.1 mmol/L   Chloride 108 98 - 111 mmol/L   CO2 21 (L) 22 - 32 mmol/L   Glucose, Bld 91 70 - 99 mg/dL    Comment: Glucose reference range applies only to samples taken after fasting for at least 8 hours.   BUN 22 8 - 23 mg/dL   Creatinine, Ser 6.21 0.44 - 1.00 mg/dL   Calcium 8.8 (L) 8.9 - 10.3 mg/dL   Total Protein 6.0 (L) 6.5 - 8.1 g/dL   Albumin 2.6 (L) 3.5 - 5.0 g/dL   AST 12 (L) 15 - 41 U/L   ALT 11 0 - 44 U/L   Alkaline Phosphatase 101 38 - 126 U/L   Total Bilirubin 0.6 0.3 - 1.2 mg/dL   GFR, Estimated >30 >86 mL/min    Comment: (NOTE) Calculated using the CKD-EPI Creatinine Equation (2021)    Anion gap 11 5 - 15    Comment: Performed at [facility], [address] [place], [place] [address]  Troponin I (High Sensitivity)     Status: None   Collection Time: 01/19/23  7:22 PM  Result Value Ref Range   Troponin I (High Sensitivity) 4 <18 ng/L    Comment: (NOTE) Elevated high sensitivity troponin I (hsTnI) values and significant  changes across serial measurements may suggest ACS but many other  chronic and acute conditions are known to elevate hsTnI results.  Refer to the "Links" section for chest pain algorithms and additional  guidance. Performed at [facility], [address], [place], [place] [address]   Brain natriuretic peptide     Status:  Abnormal   Collection Time: 01/19/23  7:23 PM  Result Value Ref Range   B Natriuretic Peptide 114.9 (H) 0.0 - 100.0 pg/mL    Comment: Performed at [facility], [address], [place], [place] [address]  Resp panel by RT-PCR (RSV, Flu A&B, Covid) Anterior Nasal Swab     Status: None   Collection Time: 01/19/23  7:23 PM   Specimen:  Anterior Nasal Swab  Result Value Ref Range   SARS Coronavirus 2 by RT PCR NEGATIVE NEGATIVE   Influenza A by PCR NEGATIVE NEGATIVE   Influenza B by PCR NEGATIVE NEGATIVE    Comment: (NOTE) The Xpert Xpress SARS-CoV-2/FLU/RSV plus assay is intended as an aid in the diagnosis of influenza from Nasopharyngeal swab specimens and should not be used as a sole basis for treatment. Nasal washings and aspirates are unacceptable for Xpert Xpress SARS-CoV-2/FLU/RSV testing.  Fact Sheet for Patients: BloggerCourse.com  Fact Sheet for Healthcare Providers: SeriousBroker.it  This test is not yet approved or cleared by the [place] FDA and has been authorized for detection and/or diagnosis of SARS-CoV-2 by FDA under an Emergency Use Authorization (EUA). This EUA will remain in effect (meaning this test can be used) for the duration of the COVID-19 declaration under Section 564(b)(1) of the Act, 21 U.S.C. section 360bbb-3(b)(1), unless the authorization is terminated or revoked.     Resp Syncytial Virus by PCR NEGATIVE NEGATIVE    Comment: (NOTE) Fact Sheet for Patients: BloggerCourse.com  Fact Sheet for Healthcare Providers: SeriousBroker.it  This test is not yet approved or cleared by the [place] FDA and has been authorized for detection and/or diagnosis of SARS-CoV-2 by FDA under an Emergency Use Authorization (EUA). This EUA will remain in effect (meaning this test can be used) for the duration of the COVID-19 declaration under Section  564(b)(1) of the Act, 21 U.S.C. section 360bbb-3(b)(1), unless the authorization is terminated or revoked.  Performed at [facility], [address] [place], [place] [address]   POC occult blood, ED     Status: Abnormal   Collection Time: 01/19/23  9:23 PM  Result Value Ref Range   Fecal Occult Bld POSITIVE (A) NEGATIVE  Troponin I (High Sensitivity)     Status: None   Collection Time: 01/19/23  9:27 PM  Result Value Ref Range   Troponin I (High Sensitivity) 4 <18 ng/L    Comment: (NOTE) Elevated high sensitivity troponin I (hsTnI) values and significant  changes across serial measurements may suggest ACS but many other  chronic and acute conditions are known to elevate hsTnI results.  Refer to the "Links" section for chest pain algorithms and additional  guidance. Performed at [facility], [address], [place], [place] [address]    DG Chest 2 View  Result Date: 01/19/2023 CLINICAL DATA:  SOB, ?pericardial effusion EXAM: CHEST - 2 VIEW COMPARISON:  Same day chest CT. FINDINGS: Globular appearance of the heart. No consolidation. Trace left pleural effusion. Streaky overlying left basilar opacities. No visible pneumothorax. Right IJ approach central venous catheter with the tip projecting at the mid SVC. IMPRESSION: 1. No consolidation. 2. Globular appearance of the heart, compatible with known pericardial effusion. 3. Trace left pleural effusion with overlying atelectasis. Electronically Signed   By: Feliberto Harts M.D.   On: 01/19/2023 20:08   CT Angio Chest Pulmonary Embolism (PE) W or WO Contrast  Result Date: 01/19/2023 CLINICAL DATA:  History of metastatic colon cancer with cough, dyspnea, chest pain and fever. EXAM: CT ANGIOGRAPHY CHEST WITH CONTRAST TECHNIQUE: Multidetector CT imaging of the chest was performed using the standard protocol during bolus administration of intravenous contrast. Multiplanar CT image reconstructions and MIPs were obtained to evaluate  the vascular anatomy. RADIATION DOSE REDUCTION: This exam was performed according to the departmental dose-optimization program which includes automated exposure control, adjustment of the mA and/or kV according to patient size and/or  use of iterative reconstruction technique. CONTRAST:  80mL OMNIPAQUE IOHEXOL 350 MG/ML SOLN COMPARISON:  11/03/2022 FINDINGS: Cardiovascular: New moderate size pericardial effusion. Thoracic aorta is normal in caliber without aneurysm or dissection. There is calcified plaque throughout the thoracic aorta. Pulmonary arterial system is well opacified without evidence of emboli. Remaining vascular structures are unremarkable. Mediastinum/Nodes: A few small subcentimeter shotty mediastinal lymph nodes. No significant mediastinal or hilar adenopathy. Remaining mediastinal structures are unchanged. Lungs/Pleura: Lungs are adequately inflated. Mild biapical pleural thickening unchanged. Several calcified granulomas are present bilaterally with the largest measuring 8 mm over the posterior left lower lobe. No acute airspace process. Airways are within normal. Mild scarring left base with small amount of left pleural fluid. Upper Abdomen: Nodular contour to the liver suggesting cirrhosis. Previously seen liver hypodensities not well demonstrated on today's exam. Calcified plaque over the abdominal aorta. Remaining visualized images over the upper abdomen are unchanged. Musculoskeletal: No focal abnormality. Review of the MIP images confirms the above findings. IMPRESSION: 1. No evidence of pulmonary embolism. 2. New moderate size pericardial effusion. 3. Mild scarring left base with small amount of left pleural fluid. 4. Evidence of prior granulomatous disease. 5. Nodular contour to the liver suggesting cirrhosis. Previously seen liver hypodensities not well demonstrated on today's exam. 6. Aortic atherosclerosis. Aortic Atherosclerosis (ICD10-I70.0). Electronically Signed   By: Elberta Fortis  M.D.   On: 01/19/2023 14:21    Pending Labs Unresulted Labs (From admission, onward)     Start     Ordered   01/19/23 2145  C-reactive protein  Once,   R        01/19/23 2145   01/19/23 2145  Sedimentation rate  Once,   R        01/19/23 2145            Vitals/Pain Today's Vitals   01/19/23 1800 01/19/23 2030 01/19/23 2100 01/19/23 2130  BP:  (!) 142/63 128/65 (!) 135/57  Pulse:  96 94 93  Resp:  16 18 16   Temp:      TempSrc:      SpO2:  98% 97% 97%  Weight:      Height:      PainSc: 0-No pain       Isolation Precautions No active isolations  Medications Medications - No data to display  Mobility walks     Focused Assessments Pulmonary Assessment Handoff:  Lung sounds:   O2 Device: Room Air      R Recommendations: See Admitting Provider Note  Report given to:   Additional Notes:

## 2023-01-19 NOTE — Assessment & Plan Note (Addendum)
New onset mod pericardial effusion with DOE, asymptomatic at rest. Cards consult (see Dr. Odis Hollingshead note) ESR, CRP pending BNP slightly elevated at 114 Hold off on NSAIDs given anemia + already on lovenox Get 2d echo Start colchicine

## 2023-01-19 NOTE — Patient Instructions (Signed)

## 2023-01-19 NOTE — Progress Notes (Signed)
[facility] ER physician requests direct transport to [facility]

## 2023-01-19 NOTE — ED Triage Notes (Signed)
PT BIB [facility] from [facility], for SOB and fluid around her heart.  Talking in full sentences and vitals are stable. [facility] scanned her there. Port is accessed. AOX4.  Respirations 22 HR 80's Hgb 8.8

## 2023-01-19 NOTE — Progress Notes (Signed)
ON-CALL CARDIOLOGY 01/19/23  Patient's name: Stacy Burns.   MRN: [id number].    DOB: [birth year] Primary care provider: Lance Bosch, NP.   Interaction regarding this patient's care today: Consult requested by Dr. Derrek Monaco for pericardial effusion.  Patient has a history of colon cancer currently on medical therapy and had a CT scan earlier today which noted pericardial effusion.  Patient was sent to the ED for further evaluation.  Symptomatically patient has been experiencing positional chest pain over the last few days according to the ED physician.  Troponins are pending.  Initial EKG shows sinus rhythm with a right bundle branch block, not consistent with ACS.  Impression: Pericardial effusion, asymptomatic. Colon cancer  Recommendations: Troponins are currently pending. Check BNP, ESR, CRP. Patient already taking Lovenox as per ED physician and given her anemia will hold off on high intensity NSAIDs for now until anemia workup is complete and echocardiogram was performed. As for the last vital signs SBP 148 mmHg and pulse 88 bpm. Hemodynamically stable per ED physician. Echo will be ordered to evaluate for structural heart disease and left ventricular systolic function. Will start colchicine Admit to medicine -recommend workup for anemia as well. Cardiology will consult in the morning, call if any questions arise.  Telephone encounter total time: 10 minutes  Delilah Shan [facility]  Pager: [phone] Office: [phone]

## 2023-01-19 NOTE — H&P (Addendum)
History and Physical    Patient: Stacy Burns:[id number] DOB: 1945/12/02 DOA: 01/19/2023 DOS: the patient was seen and examined on 01/19/2023 PCP: Lance Bosch, NP  Patient coming from: Home  Chief Complaint:  Chief Complaint  Patient presents with   Shortness of Breath   HPI: Stacy Burns is a 77 y.o. 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  Pt in to ED with c/o SOB and CP when sitting forward.  SOB is present primarily with exertion.  Pt currently on Vectibix + Braftovi.  Saw onc today for routine immunotherapy treatment with Vectibix, found to have pericardial effusion on CT scan.  Sent in to ED.  Didn't get Vectibix in office today.  No CP at this time.  No melena nor grossly bloody stool.    Review of Systems: As mentioned in the history of present illness. All other systems reviewed and are negative. Past Medical History:  Diagnosis Date   colon ca dx'd 12/2019   COVID-19 virus infection 08/2019   not hospitalized.     Severe anemia 12/17/2019   Past Surgical History:  Procedure Laterality Date   BIOPSY  12/18/2019   Procedure: BIOPSY;  Surgeon: Tressia Danas, MD;  Location: [facility] ENDOSCOPY;  Service: Gastroenterology;;   COLONOSCOPY WITH PROPOFOL N/A 12/18/2019   Procedure: COLONOSCOPY WITH PROPOFOL;  Surgeon: Tressia Danas, MD;  Location: [facility] ENDOSCOPY;  Service: Gastroenterology;  Laterality: N/A;   ESOPHAGOGASTRODUODENOSCOPY (EGD) WITH PROPOFOL N/A 12/18/2019   Procedure: ESOPHAGOGASTRODUODENOSCOPY (EGD) WITH PROPOFOL;  Surgeon: Tressia Danas, MD;  Location: [facility] ENDOSCOPY;  Service: Gastroenterology;  Laterality: N/A;   PORTACATH PLACEMENT N/A 12/19/2019   Procedure: INSERTION PORT-A-CATH WITH ULTRASOUND;  Surgeon: Emelia Loron, MD;  Location: [facility] OR;  Service: General;  Laterality: N/A;   SUBMUCOSAL TATTOO INJECTION  12/18/2019   Procedure: SUBMUCOSAL TATTOO INJECTION;   Surgeon: Tressia Danas, MD;  Location: [facility] ENDOSCOPY;  Service: Gastroenterology;;   TUBAL LIGATION     Social History:  reports that she quit smoking about 38 years ago. Her smoking use included cigarettes. She has never used smokeless tobacco. She reports that she does not currently use alcohol. She reports that she does not use drugs.  Allergies  Allergen Reactions   Milk-Related Compounds Nausea Only and Other (See Comments)    Delayed reaction if too much consumed    Penicillins Anaphylaxis and Other (See Comments)    Both parents had anaphylactic reactions to this   Pineapple Swelling and Other (See Comments)    Tongue swells and causes acid bumps there, also   Chocolate Other (See Comments)    Nasal congestion   Hydrocodone Nausea And Vomiting   Other Itching, Rash and Other (See Comments)    "Most all antibiotics and OTC cold meds break me out" Allergic to ALL NUTS, also = Itching   Sulfa Antibiotics Rash and Other (See Comments)    Severe rash    Family History  Problem Relation Age of Onset   Multiple myeloma Mother    Diabetes Mellitus II Mother    Leukemia Father    Muscular dystrophy Brother     Prior to Admission medications   Medication Sig Start Date End Date Taking? Authorizing Provider  Cholecalciferol (VITAMIN D3) 25 MCG (1000 UT) CAPS Take 1,000 Units by mouth daily.   Yes [provider]  doxycycline (VIBRA-TABS) 100 MG tablet TAKE 1 TABLET(100 MG) BY MOUTH TWICE DAILY Patient taking differently: Take  100 mg by mouth 2 (two) times daily. 01/10/23  Yes Ladene Artist, MD  encorafenib (BRAFTOVI) 75 MG capsule TAKE 4 CAPSULES ONCE DAILY 11/24/22  Yes Ladene Artist, MD  enoxaparin (LOVENOX) 40 MG/0.4ML injection INJECT 0.4MLS INTO THE SKIN DAILY 07/14/22  Yes Rana Snare, NP  ferrous sulfate (FEROSUL) 325 (65 FE) MG tablet TAKE 1 TABLET(325 MG) BY MOUTH DAILY WITH BREAKFAST 10/27/22  Yes Ladene Artist, MD  lidocaine-prilocaine (EMLA) cream  Apply 1 Application topically as needed. 01/19/23  Yes Rana Snare, NP  magnesium oxide (MAG-OX) 400 (240 Mg) MG tablet Take 1 tablet (400 mg total) by mouth 2 (two) times daily. 01/19/23  Yes Rana Snare, NP  nystatin (MYCOSTATIN/NYSTOP) powder Apply 1 Application topically 3 (three) times daily. 09/15/22  Yes Rana Snare, NP  oxyCODONE-acetaminophen (PERCOCET/ROXICET) 5-325 MG tablet Take 1 tablet by mouth every 8 (eight) hours as needed for severe pain. 12/22/22  Yes Ladene Artist, MD  potassium chloride (KLOR-CON) 10 MEQ tablet TAKE 1 TABLET(10 MEQ) BY MOUTH TWICE DAILY Patient taking differently: Take 10 mEq by mouth 2 (two) times daily. TAKE 1 TABLET(10 MEQ) BY MOUTH TWICE DAILY 11/21/22  Yes Ladene Artist, MD  vitamin B-12 (CYANOCOBALAMIN) 1000 MCG tablet Take 5,000 mcg by mouth daily.   Yes [provider]  albuterol (VENTOLIN HFA) 108 (90 Base) MCG/ACT inhaler INHALE 2 PUFFS INTO THE LUNGS FOUR TIMES DAILY AS NEEDED Patient taking differently: Inhale 2 puffs into the lungs every 4 (four) hours as needed for shortness of breath or wheezing. INHALE 2 PUFFS INTO THE LUNGS FOUR TIMES DAILY AS NEEDED 01/11/23   Ladene Artist, MD  magic mouthwash SOLN Take 5-10 mLs by mouth 4 (four) times daily as needed for mouth pain. Swish and spit 12/22/22   Ladene Artist, MD    Physical Exam: Vitals:   01/19/23 2130 01/19/23 2200 01/19/23 2222 01/19/23 2230  BP: (!) 135/57 133/66  131/60  Pulse: 93 94  91  Resp: 16 18  (!) 27  Temp:   97.9 F (36.6 C)   TempSrc:   Oral   SpO2: 97% 97%  98%  Weight:      Height:       Constitutional: NAD, calm, comfortable Respiratory: clear to auscultation bilaterally, no wheezing, no crackles. Normal respiratory effort. No accessory muscle use.  Cardiovascular: Regular rate and rhythm, no murmurs / rubs / gallops. No extremity edema. 2+ pedal pulses. No carotid bruits.  Abdomen: no tenderness, no masses palpated. No hepatosplenomegaly. Bowel  sounds positive.  Neurologic: CN 2-12 grossly intact. Sensation intact, DTR normal. Strength 5/5 in all 4.  Psychiatric: Normal judgment and insight. Alert and oriented x 3. Normal mood.   Data Reviewed:    Labs on Admission: I have personally reviewed following labs and imaging studies  CBC: Recent Labs  Lab 01/19/23 1145 01/19/23 1922  WBC 7.0 6.9  NEUTROABS 5.1 4.8  HGB 8.7* 8.4*  HCT 26.9* 27.2*  MCV 87.1 88.6  PLT 256 231   Basic Metabolic Panel: Recent Labs  Lab 01/19/23 1145 01/19/23 1922  NA 139 140  K 4.6 4.2  CL 109 108  CO2 22 21*  GLUCOSE 89 91  BUN 28* 22  CREATININE 0.76 0.80  CALCIUM 9.0 8.8*  MG 1.7  --    GFR: Estimated Creatinine Clearance: 57.3 mL/min (by C-G formula based on SCr of 0.8 mg/dL). Liver Function Tests: Recent Labs  Lab 01/19/23 1145  01/19/23 1922  AST 10* 12*  ALT 7 11  ALKPHOS 102 101  BILITOT 0.4 0.6  PROT 6.5 6.0*  ALBUMIN 3.4* 2.6*    Urine analysis:    Component Value Date/Time   COLORURINE YELLOW 12/22/2021 1027   APPEARANCEUR CLEAR 12/22/2021 1027   LABSPEC 1.029 12/22/2021 1027   PHURINE 6.0 12/22/2021 1027   GLUCOSEU NEGATIVE 12/22/2021 1027   HGBUR TRACE (A) 12/22/2021 1027   BILIRUBINUR NEGATIVE 12/22/2021 1027   KETONESUR NEGATIVE 12/22/2021 1027   PROTEINUR >300 (A) 01/10/2022 1020   NITRITE NEGATIVE 12/22/2021 1027   LEUKOCYTESUR NEGATIVE 12/22/2021 1027    Radiological Exams on Admission: DG Chest 2 View  Result Date: 01/19/2023 CLINICAL DATA:  SOB, ?pericardial effusion EXAM: CHEST - 2 VIEW COMPARISON:  Same day chest CT. FINDINGS: Globular appearance of the heart. No consolidation. Trace left pleural effusion. Streaky overlying left basilar opacities. No visible pneumothorax. Right IJ approach central venous catheter with the tip projecting at the mid SVC. IMPRESSION: 1. No consolidation. 2. Globular appearance of the heart, compatible with known pericardial effusion. 3. Trace left pleural effusion  with overlying atelectasis. Electronically Signed   By: Feliberto Harts M.D.   On: 01/19/2023 20:08   CT Angio Chest Pulmonary Embolism (PE) W or WO Contrast  Result Date: 01/19/2023 CLINICAL DATA:  History of metastatic colon cancer with cough, dyspnea, chest pain and fever. EXAM: CT ANGIOGRAPHY CHEST WITH CONTRAST TECHNIQUE: Multidetector CT imaging of the chest was performed using the standard protocol during bolus administration of intravenous contrast. Multiplanar CT image reconstructions and MIPs were obtained to evaluate the vascular anatomy. RADIATION DOSE REDUCTION: This exam was performed according to the departmental dose-optimization program which includes automated exposure control, adjustment of the mA and/or kV according to patient size and/or use of iterative reconstruction technique. CONTRAST:  80mL OMNIPAQUE IOHEXOL 350 MG/ML SOLN COMPARISON:  11/03/2022 FINDINGS: Cardiovascular: New moderate size pericardial effusion. Thoracic aorta is normal in caliber without aneurysm or dissection. There is calcified plaque throughout the thoracic aorta. Pulmonary arterial system is well opacified without evidence of emboli. Remaining vascular structures are unremarkable. Mediastinum/Nodes: A few small subcentimeter shotty mediastinal lymph nodes. No significant mediastinal or hilar adenopathy. Remaining mediastinal structures are unchanged. Lungs/Pleura: Lungs are adequately inflated. Mild biapical pleural thickening unchanged. Several calcified granulomas are present bilaterally with the largest measuring 8 mm over the posterior left lower lobe. No acute airspace process. Airways are within normal. Mild scarring left base with small amount of left pleural fluid. Upper Abdomen: Nodular contour to the liver suggesting cirrhosis. Previously seen liver hypodensities not well demonstrated on today's exam. Calcified plaque over the abdominal aorta. Remaining visualized images over the upper abdomen are  unchanged. Musculoskeletal: No focal abnormality. Review of the MIP images confirms the above findings. IMPRESSION: 1. No evidence of pulmonary embolism. 2. New moderate size pericardial effusion. 3. Mild scarring left base with small amount of left pleural fluid. 4. Evidence of prior granulomatous disease. 5. Nodular contour to the liver suggesting cirrhosis. Previously seen liver hypodensities not well demonstrated on today's exam. 6. Aortic atherosclerosis. Aortic Atherosclerosis (ICD10-I70.0). Electronically Signed   By: Elberta Fortis M.D.   On: 01/19/2023 14:21    EKG: Independently reviewed.   Assessment and Plan: * Pericardial effusion New onset mod pericardial effusion with DOE, asymptomatic at rest. Cards consult (see Dr. Odis Hollingshead note) ESR, CRP pending BNP slightly elevated at 114 Hold off on NSAIDs given anemia + already on lovenox Get 2d echo Start colchicine  Cancer of ascending colon (HCC) On review of UTD known adverse reactions of chemo meds she's on: I dont see where braftovi can cause pericardial effusion specifically, but I see that it can cause new onset of CHF. I dont see where Vectibix can cause any sort of cardiac toxicity, pericardial effusion, etc.  Hold braftovi for the moment pending cardiac work up for effusion. Vectibix infusion was held today by oncology in office.  Anemia Pt with chronic anemia, hemoccult positive, likely in setting of her known colon cancer not having achieved remission. Last took lovenox this AM  Takes lovenox chronically for h/o DVT back around 2021 when she was first diagnosed with colon CA. Ill hold off on re-ordering lovenox for the moment, at least until heme/onc + day team can decide if further GIB work up is needed (or if this just represents ongoing bleeding from active colon cancer as I suspect). Not due for next lovenox dose until tomorrow AM anyhow. Getting repeat CBC in AM      Advance Care Planning:   Code Status: DNR  Confirmed with patient, do everything up until CPR, but no CPR.  Consults: Cards Dr. Odis Hollingshead Also sent secure chat to Dr. Myna Hidalgo and Dr. Truett Perna  Family Communication: No family in room  Severity of Illness: The appropriate patient status for this patient is OBSERVATION. Observation status is judged to be reasonable and necessary in order to provide the required intensity of service to ensure the patient's safety. The patient's presenting symptoms, physical exam findings, and initial radiographic and laboratory data in the context of their medical condition is felt to place them at decreased risk for further clinical deterioration. Furthermore, it is anticipated that the patient will be medically stable for discharge from the hospital within 2 midnights of admission.   Author: Hillary Bow., DO 01/19/2023 10:46 PM  For on call review [email or web address].

## 2023-01-19 NOTE — ED Provider Notes (Signed)
[place] EMERGENCY DEPARTMENT AT [facility] Provider Note   CSN: [id number] Arrival date & time: 01/19/23  1730     History {Add pertinent medical, surgical, social history, OB history to HPI:1} Chief Complaint  Patient presents with  . Shortness of Breath    Clifford VERNIE LAMBERSON is a 77 y.o. female with colon cancer on panitumumab, hepatic cirrhosis, h/o GIB, h/o RLE DVT on lovenox who presents with SOB and CP.   PT BIB [facility] from [facility], for SOB and fluid around her heart. Had an episode of "grabbing" chest pain the other night that kept her awake, was so painful to lie down but not as painful to sit up. Pain comes and goes during the night mostly when she is just lying down, doesn't have any chest pain now. She doesn't feel SOB at rest but has DOE after just 5 steps. Denies N/V/D/C, abdominal pain, pleuritic chest pain, leg swelling. Currently takes lovenox 40 mg daily.  Was seen by oncologist today and had CT scan which showed new pericardial effusion. Was sent to ED for admission.     Shortness of Breath      Home Medications Prior to Admission medications   Medication Sig Start Date End Date Taking? Authorizing Provider  albuterol (VENTOLIN HFA) 108 (90 Base) MCG/ACT inhaler INHALE 2 PUFFS INTO THE LUNGS FOUR TIMES DAILY AS NEEDED 01/11/23   Ladene Artist, MD  Cholecalciferol (VITAMIN D3) 25 MCG (1000 UT) CAPS Take 1,000 Units by mouth daily.    [provider]  diphenoxylate-atropine (LOMOTIL) 2.5-0.025 MG tablet Take 1-2 tablets by mouth 4 (four) times daily as needed for diarrhea or loose stools. 12/22/22   Ladene Artist, MD  doxycycline (VIBRA-TABS) 100 MG tablet TAKE 1 TABLET(100 MG) BY MOUTH TWICE DAILY 01/10/23   Ladene Artist, MD  encorafenib (BRAFTOVI) 75 MG capsule TAKE 4 CAPSULES ONCE DAILY 11/24/22   Ladene Artist, MD  enoxaparin (LOVENOX) 40 MG/0.4ML injection INJECT 0.4MLS INTO THE SKIN DAILY 07/14/22   Rana Snare, NP   ferrous sulfate (FEROSUL) 325 (65 FE) MG tablet TAKE 1 TABLET(325 MG) BY MOUTH DAILY WITH BREAKFAST 10/27/22   Ladene Artist, MD  lidocaine-prilocaine (EMLA) cream Apply 1 Application topically as needed. 01/19/23   Rana Snare, NP  loperamide (IMODIUM) 2 MG capsule Take 2 mg by mouth as needed for diarrhea or loose stools.    [provider]  magic mouthwash SOLN Take 5-10 mLs by mouth 4 (four) times daily as needed for mouth pain. Swish and spit Patient not taking: Reported on 01/19/2023 12/22/22   Ladene Artist, MD  magnesium oxide (MAG-OX) 400 (240 Mg) MG tablet Take 1 tablet (400 mg total) by mouth 2 (two) times daily. 01/19/23   Rana Snare, NP  nystatin (MYCOSTATIN/NYSTOP) powder Apply 1 Application topically 3 (three) times daily. Patient not taking: Reported on 11/24/2022 09/15/22   Rana Snare, NP  ondansetron (ZOFRAN) 8 MG tablet Take 1 tablet (8 mg total) by mouth every 8 (eight) hours as needed for nausea or vomiting. Patient not taking: Reported on 10/13/2021 12/30/19   Ladene Artist, MD  oxyCODONE-acetaminophen (PERCOCET/ROXICET) 5-325 MG tablet Take 1 tablet by mouth every 8 (eight) hours as needed for severe pain. 12/22/22   Ladene Artist, MD  potassium chloride (KLOR-CON) 10 MEQ tablet TAKE 1 TABLET(10 MEQ) BY MOUTH TWICE DAILY 11/21/22   Ladene Artist, MD  prochlorperazine (COMPAZINE) 10 MG tablet  Take 1 tablet (10 mg total) by mouth every 6 (six) hours as needed for nausea or vomiting. 12/22/22   Rana Snare, NP  vitamin B-12 (CYANOCOBALAMIN) 1000 MCG tablet Take 5,000 mcg by mouth daily.    [provider]      Allergies    Milk-related compounds, Penicillins, Pineapple, Chocolate, Hydrocodone, Other, and Sulfa antibiotics    Review of Systems   Review of Systems  Respiratory:  Positive for shortness of breath.    Review of systems Negative for f/c.  A 10 point review of systems was performed and is negative unless otherwise reported in  HPI.  Physical Exam Updated Vital Signs BP (!) 148/62 (BP Location: Right Arm)   Pulse 88   Temp 98.7 F (37.1 C) (Oral)   Resp 20   Ht 5\' 7"  (1.702 m)   Wt 65.3 kg   SpO2 100%   BMI 22.55 kg/m  Physical Exam General: Normal appearing female, lying in bed.  HEENT: PERRLA, Sclera anicteric, MMM, trachea midline.  Cardiology: RRR, no murmurs/rubs/gallops. BL radial and DP pulses equal bilaterally.  Resp: Normal respiratory rate and effort. CTAB, no wheezes, rhonchi, crackles.  Abd: Soft, non-tender, non-distended. No rebound tenderness or guarding.  GU: Deferred. MSK: No peripheral edema or signs of trauma. Extremities without deformity or TTP. No cyanosis or clubbing. Skin: warm, dry. No rashes or lesions. Back: No CVA tenderness Neuro: A&Ox4, CNs II-XII grossly intact. MAEs. Sensation grossly intact.  Psych: Normal mood and affect.   ED Results / Procedures / Treatments   Labs (all labs ordered are listed, but only abnormal results are displayed) Labs Reviewed - No data to display  EKG None  Radiology CT Angio Chest Pulmonary Embolism (PE) W or WO Contrast  Result Date: 01/19/2023 CLINICAL DATA:  History of metastatic colon cancer with cough, dyspnea, chest pain and fever. EXAM: CT ANGIOGRAPHY CHEST WITH CONTRAST TECHNIQUE: Multidetector CT imaging of the chest was performed using the standard protocol during bolus administration of intravenous contrast. Multiplanar CT image reconstructions and MIPs were obtained to evaluate the vascular anatomy. RADIATION DOSE REDUCTION: This exam was performed according to the departmental dose-optimization program which includes automated exposure control, adjustment of the mA and/or kV according to patient size and/or use of iterative reconstruction technique. CONTRAST:  80mL OMNIPAQUE IOHEXOL 350 MG/ML SOLN COMPARISON:  11/03/2022 FINDINGS: Cardiovascular: New moderate size pericardial effusion. Thoracic aorta is normal in caliber without  aneurysm or dissection. There is calcified plaque throughout the thoracic aorta. Pulmonary arterial system is well opacified without evidence of emboli. Remaining vascular structures are unremarkable. Mediastinum/Nodes: A few small subcentimeter shotty mediastinal lymph nodes. No significant mediastinal or hilar adenopathy. Remaining mediastinal structures are unchanged. Lungs/Pleura: Lungs are adequately inflated. Mild biapical pleural thickening unchanged. Several calcified granulomas are present bilaterally with the largest measuring 8 mm over the posterior left lower lobe. No acute airspace process. Airways are within normal. Mild scarring left base with small amount of left pleural fluid. Upper Abdomen: Nodular contour to the liver suggesting cirrhosis. Previously seen liver hypodensities not well demonstrated on today's exam. Calcified plaque over the abdominal aorta. Remaining visualized images over the upper abdomen are unchanged. Musculoskeletal: No focal abnormality. Review of the MIP images confirms the above findings. IMPRESSION: 1. No evidence of pulmonary embolism. 2. New moderate size pericardial effusion. 3. Mild scarring left base with small amount of left pleural fluid. 4. Evidence of prior granulomatous disease. 5. Nodular contour to the liver suggesting cirrhosis. Previously seen  liver hypodensities not well demonstrated on today's exam. 6. Aortic atherosclerosis. Aortic Atherosclerosis (ICD10-I70.0). Electronically Signed   By: Elberta Fortis M.D.   On: 01/19/2023 14:21    Procedures Procedures  {Document cardiac monitor, telemetry assessment procedure when appropriate:1}  Medications Ordered in ED Medications - No data to display  ED Course/ Medical Decision Making/ A&P                          Medical Decision Making Amount and/or Complexity of Data Reviewed Labs: ordered. Radiology: ordered.    This patient presents to the ED for concern of chest pain, SOB, new pericardial  effusion; this involves an extensive number of treatment options, and is a complaint that carries with it a high risk of complications and morbidity.  I considered the following differential and admission for this acute, potentially life threatening condition.   MDM:    DDX for chest pain includes but is not limited to:  Pt w/ SOB/DOE and new positional CP over the last few days presents from cancer center w/ new pericardial effusion. Consider malignant pericardial effusion d/t known cancer w/ mets, pericarditis especially w/ chest pain improved w/ sitting up, or myocarditis.  No c/f dissection, PE on CT scan. Will obtain troponin to r/o ACS as well. Her BP is stable in 130s-140s systolic, no c/f acute cardiac tamponade/obstructive shock.   EKG demonstrates new diffuse but very mild STEs. High degree of suspicion for pericarditis.   Clinical Course as of 01/19/23 1957  Fri Jan 19, 2023  1957 Consulted to cardiology [HN]    Clinical Course User Index [HN] Loetta Rough, MD    Labs: I Ordered, and personally interpreted labs.  The pertinent results include:  those listed above  Imaging Studies ordered: I ordered imaging studies including CXR I independently visualized and interpreted imaging. I agree with the radiologist interpretation  Additional history obtained from chart review.   Cardiac Monitoring: .The patient was maintained on a cardiac monitor.  I personally viewed and interpreted the cardiac monitored which showed an underlying rhythm of: NSR  Reevaluation: After the interventions noted above, I reevaluated the patient and found that they have :improved  Social Determinants of Health: .Patient lives independently    Disposition:  Admit  Co morbidities that complicate the patient evaluation . Past Medical History:  Diagnosis Date  . colon ca dx'd 12/2019  . COVID-19 virus infection 08/2019   not hospitalized.    . Severe anemia 12/17/2019     Medicines No  orders of the defined types were placed in this encounter.   I have reviewed the patients home medicines and have made adjustments as needed  Problem List / ED Course: Problem List Items Addressed This Visit   None        {Document critical care time when appropriate:1} {Document review of labs and clinical decision tools ie heart score, Chads2Vasc2 etc:1}  {Document your independent review of radiology images, and any outside records:1} {Document your discussion with family members, caretakers, and with consultants:1} {Document social determinants of health affecting pt's care:1} {Document your decision making why or why not admission, treatments were needed:1}  This note was created using dictation software, which may contain spelling or grammatical errors.

## 2023-01-19 NOTE — Telephone Encounter (Signed)
I had a discussion with Stacy Burns, a registered nurse, and provided a detailed report. The patient has a history of colon cancer and is experiencing shortness of breath, chest pain, and difficulty breathing. A recent CT scan revealed the presence of fluid around the heart. The patient was transported by [facility]. The patient's family members have been notified of the current situation.

## 2023-01-19 NOTE — Progress Notes (Signed)
[facility] OFFICE PROGRESS NOTE   Diagnosis:  Colon cancer  INTERVAL HISTORY:   Stacy Burns returns as scheduled. She continues every 2 week Panitumumab.  She continues encorafenib.  She denies nausea or vomiting.  No diarrhea.  No mouth sores.  10 to 14-day history of cough, chest congestion, chest tightness/pain with deep inspiration, shortness of breath, fever 102.3 two days ago, 101 about 10 days ago.  She is short of breath with minimal exertion and if she lays flat.  No leg swelling or calf pain.  Does report "foot cramps".  She is not aware of any bleeding.  She is currently on Lovenox 40 mg daily.  Several pruritic lesions on the legs.  Objective:  Vital signs in last 24 hours:  Blood pressure (!) 126/58, pulse 97, temperature 98.2 F (36.8 C), temperature source Oral, resp. rate 18, height 5\' 7"  (1.702 m), weight 144 lb 3.2 oz (65.4 kg), SpO2 100 %.    HEENT: No thrush or ulcers. Resp: Distant breath sounds.  She appears dyspneic. Cardio: Distant heart sounds.  Question murmur.   GI: No hepatosplenomegaly. Vascular: No leg edema.  Calves soft and nontender. Neuro: Alert and oriented. Skin: Ecchymoses scattered over forearms.  3 erythematous lesions on the legs that appear consistent with insect bites (1 on the left upper leg, 2 on the right leg). Port-A-Cath without erythema.  Lab Results:  Lab Results  Component Value Date   WBC 7.0 01/19/2023   HGB 8.7 (L) 01/19/2023   HCT 26.9 (L) 01/19/2023   MCV 87.1 01/19/2023   PLT 256 01/19/2023   NEUTROABS 5.1 01/19/2023    Imaging:  No results found.  Medications: I have reviewed the patient's current medications.  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  Cirrhosis.  Innumerable liver lesions. Colonoscopy 12/18/2019-ulcerated partially obstructing large mass in the mid ascending  colon.  Biopsy invasive adenocarcinoma; preserved expression major MMR proteins Upper endoscopy 12/18/2019-normal esophagus, stomach, examined duodenum CEA 12/19/2019-8,923 Biopsy liver lesion 12/23/2019-adenocarcinoma Foundation 1-K-ras wild-type; tumor mutation burden greater than or equal to 10; microsatellite stable; BRAF V600E Cycle 1 FOLFOX 01/01/2020 Cycle 2 FOLFOX 01/15/2020  Cycle 3 FOLFOX 01/29/2020, Udenyca added Cycle 4 FOLFOX 02/12/2020, Udenyca held Cycle 5 FOLFOX 02/26/2020, Udenyca Cycle 6 FOLFOX 03/11/2020, Udenyca held CT abdomen/pelvis 03/16/2020-stable diffuse liver lesions, stable strictured appearing ascending colon, possible pericolonic implant, vague left lower lobe nodule Cycle 1 FOLFIRI/Avastin 04/06/2020 Cycle 2 FOLFIRI/Avastin 04/21/2020 Cycle 3 FOLFIRI/Avastin 05/06/2020 Cycle 4 FOLFIRI/Avastin 05/20/2020 Cycle 5 FOLFIRI/Avastin 06/03/2020 CTs 06/15/2020-mild to moderate improvement in hepatic metastasis.  No new or progressive disease.  Narrowing within the proximal ascending colon with upstream mild cecal and terminal ileum dilatation, similar. Cycle 6 FOLFIRI/Avastin 06/17/2020 Cycle 7 FOLFIRI/Avastin 07/01/2020 Cycle 8 FOLFIRI/Avastin 07/15/2020 Cycle 9 FOLFIRI/Avastin 07/29/2020-5-FU bolus eliminated, 5-FU infusion and irinotecan dose reduced Cycle 10 FOLFIRI/Avastin 08/12/2020 CTs 08/23/2020- mild residual wall thickening involving the cecum/ascending colon.  Numerous liver metastases improved. Cycle 11 FOLFIRI/Avastin 08/25/2020 Cycle 12 FOLFIRI/Avastin 09/16/2020 Cycle 13 FOLFIRI/Avastin 10/07/2020 Cycle 14 FOLFIRI/Avastin 10/28/2020 Cycle 15 FOLFIRI/Avastin 11/18/2020 CT abdomen/pelvis 12/06/2020-mild improvement in multifocal hepatic metastases, wall thickening at the ascending colon with pericolonic inflammatory changes Cycle 16 FOLFIRI/Avastin 12/09/2020 Cycle 17 FOLFIRI/Avastin 12/30/2020 Cycle 18 FOLFIRI/Avastin 01/20/2021 Cycle 19 FOLFIRI/Avastin 02/10/2021 Cycle 20  FOLFIRI/Avastin 03/03/2021 Cycle 21 FOLFIRI/Avastin 03/24/2021 Cycle 22 FOLFIRI/Avastin 04/14/2021 CTs 05/04/2021-slight improvement in hepatic metastatic disease. Cycle 23 FOLFIRI/Avastin 05/05/2021 Cycle 24 FOLFIRI/Avastin  05/25/2021 Cycle 25 FOLFIRI/Avastin 06/15/2021 Cycle 26 FOLFIRI/Avastin 07/13/2021 Cycle 27 FOLFIRI/Avastin 08/02/2021 CTs 08/30/2021-majority of liver lesions are unchanged in size, 1 lesion adjacent to the gallbladder has increased, no adenopathy, persistent pericolonic stranding and peritoneal thickening at the cecum Progressive rise in CEA 08/31/2021-treatment changed to Xeloda/bevacizumab, began [place] 09/22/2021 CEA stable 11/03/2021 Xeloda/bevacizumab continued CTs 01/10/2022-increase in size of some of the liver lesions, others are stable, new tiny right lower lobe nodule, increased dilation of distal small bowel with increased wall thickening at the terminal ileum and ileocecal valve with 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, stable bilateral paracolic gutter peritoneal thickening Panitumumab/ Encorafenib 01/12/2022 (encorafenib 01/19/2022) Panitumumab 02/02/2022 Panitumumab 02/20/2022 Panitumumab 03/06/2022 Panitumumab 03/20/2022 Panitumumab 04/03/2022 CTs 04/14/2022-decrease size of liver metastases, new borderline subcarinal adenopathy, improved peritoneal thickening, cirrhosis or pseudocirrhosis, persistent dilated cecum Encorafenib and Panitumumab continued Panitumumab 04/18/2022 Panitumumab 05/05/2022 Panitumumab 05/19/2022 Panitumumab 06/02/2022 Panitumumab 06/16/2022, 4g IV mag and begin oral mag po once daily  Panitumumab 06/30/2022 Panitumumab 07/14/2022, 2 g IV magnesium, increase oral magnesium to 400 mg twice daily Panitumumab 07/28/2022, 2 g IV magnesium CTs 07/28/2022-no change in hepatic metastases, no new signs of metastatic disease, persistent mural thickening at the terminal ileum/cecum mild splenomegaly unchanged Panitumumab 08/18/2022 Panitumumab  09/01/2022 Panitumumab 09/15/2022 Panitumumab 09/29/2022 Panitumumab 10/13/2022 Panitumumab 10/27/2022 CTs 11/03/2022-unchanged hepatic metastases, no new lesions, 3.7 cm soft tissue density at the medial left kidney etiology unclear, improvement in wall thickening at the ileocecal junction development of appendiceal dilatation Panitumumab 11/10/2022 Panitumumab 11/24/2022 Panitumumab 12/08/2022 Panitumumab 12/22/2022 Panitumumab 01/05/2023 Anemia secondary to GI blood loss, on oral iron BID  Cirrhosis 12/26/2019-hospital admission for right lower extremity DVT and GI bleed, treated with heparin anticoagulation beginning 12/26/2019, converted to Lovenox at discharge 12/29/2019 Lovenox reduced to 40 mg daily 02/10/2021 secondary to bruising and nosebleeding History of low-grade fever-likely "tumor" fever COVID-19 infection January 2021  Disposition: Ms. Lebrun has metastatic colon cancer currently on active treatment with Encorafenib and Panitumumab.  She presents today for routine follow-up, reports approximate 10-day history of dyspnea, cough, chest discomfort, intermittent fever.  CT chest shows a new moderate size pericardial effusion, no evidence of pulmonary embolism.  Dr. Truett Perna reviewed her case with ER physician at [facility].  We are making arrangements for transport to [facility] emergency department for admission.  Patient seen with Dr. Truett Perna.  CT report/images reviewed with Ms. Mueck in the office today.   Lonna Cobb ANP/GNP-BC   01/19/2023  1:16 PM  This was a shared visit with Lonna Cobb.  Ms. Sprung was interviewed and examined.  We reviewed the CT images and results with her.  She presents today with a cough, dyspnea, and orthopnea for the past week.  She has a new pericardial effusion.  We suspect she is symptomatic from the pericardial effusion.  Ms. Ivers will be referred to the [facility] emergency room for hospital admission and cardiology consultation.  She has a remote  history of a right leg DVT.  She is maintained on chronic Lovenox anticoagulation.  The Lovenox dose has been reduced secondary to bleeding.  She has experienced significant improvement with systemic therapy after being diagnosed with metastatic colon cancer 3 years ago.  She is currently maintained on encorafenib and panitumumab.  Encorafenib can be placed on hold during this hospital admission.  I will check on Ms. Kueck in the hospital over the weekend.  I was present for greater than 50% of today's visit.  I performed medical decision making.  Nida Boatman  Truett Perna, MD

## 2023-01-19 NOTE — Progress Notes (Signed)
Patient seen by Lisa Thomas NP today  Vitals are within treatment parameters.  Labs reviewed by Lisa Thomas NP and are within treatment parameters.  Per physician team, patient will not be receiving treatment today.  

## 2023-01-19 NOTE — Assessment & Plan Note (Addendum)
On review of UTD known adverse reactions of chemo meds she's on: I dont see where braftovi can cause pericardial effusion specifically, but I see that it can cause new onset of CHF. I dont see where Vectibix can cause any sort of cardiac toxicity, pericardial effusion, etc.  Hold braftovi for the moment pending cardiac work up for effusion. Vectibix infusion was held today by oncology in office.

## 2023-01-20 ENCOUNTER — Observation Stay (HOSPITAL_BASED_OUTPATIENT_CLINIC_OR_DEPARTMENT_OTHER): Payer: BC Managed Care – PPO

## 2023-01-20 DIAGNOSIS — R0602 Shortness of breath: Secondary | ICD-10-CM | POA: Diagnosis not present

## 2023-01-20 DIAGNOSIS — C189 Malignant neoplasm of colon, unspecified: Secondary | ICD-10-CM

## 2023-01-20 DIAGNOSIS — Z86718 Personal history of other venous thrombosis and embolism: Secondary | ICD-10-CM

## 2023-01-20 DIAGNOSIS — I3139 Other pericardial effusion (noninflammatory): Secondary | ICD-10-CM

## 2023-01-20 DIAGNOSIS — Z87891 Personal history of nicotine dependence: Secondary | ICD-10-CM

## 2023-01-20 DIAGNOSIS — I700 Atherosclerosis of aorta: Secondary | ICD-10-CM

## 2023-01-20 DIAGNOSIS — R072 Precordial pain: Secondary | ICD-10-CM

## 2023-01-20 DIAGNOSIS — I309 Acute pericarditis, unspecified: Secondary | ICD-10-CM

## 2023-01-20 LAB — CBC
HCT: 18.7 % — ABNORMAL LOW (ref 36.0–46.0)
HCT: 21.6 % — ABNORMAL LOW (ref 36.0–46.0)
HCT: 24.8 % — ABNORMAL LOW (ref 36.0–46.0)
Hemoglobin: 5.9 g/dL — CL (ref 12.0–15.0)
Hemoglobin: 6.7 g/dL — CL (ref 12.0–15.0)
Hemoglobin: 7.9 g/dL — ABNORMAL LOW (ref 12.0–15.0)
MCH: 28.1 pg (ref 26.0–34.0)
MCH: 27.3 pg (ref 26.0–34.0)
MCH: 27.9 pg (ref 26.0–34.0)
MCHC: 31.6 g/dL (ref 30.0–36.0)
MCHC: 31 g/dL (ref 30.0–36.0)
MCHC: 31.9 g/dL (ref 30.0–36.0)
MCV: 89 fL (ref 80.0–100.0)
MCV: 87.6 fL (ref 80.0–100.0)
MCV: 88.2 fL (ref 80.0–100.0)
Platelets: 246 10*3/uL (ref 150–400)
Platelets: 192 10*3/uL (ref 150–400)
Platelets: 226 10*3/uL (ref 150–400)
RBC: 2.1 MIL/uL — ABNORMAL LOW (ref 3.87–5.11)
RBC: 2.45 MIL/uL — ABNORMAL LOW (ref 3.87–5.11)
RBC: 2.83 MIL/uL — ABNORMAL LOW (ref 3.87–5.11)
RDW: 14.3 % (ref 11.5–15.5)
RDW: 14.4 % (ref 11.5–15.5)
RDW: 14.6 % (ref 11.5–15.5)
WBC: 7.5 10*3/uL (ref 4.0–10.5)
WBC: 5.4 10*3/uL (ref 4.0–10.5)
WBC: 6.3 10*3/uL (ref 4.0–10.5)
nRBC: 0 % (ref 0.0–0.2)
nRBC: 0 % (ref 0.0–0.2)
nRBC: 0 % (ref 0.0–0.2)

## 2023-01-20 LAB — BASIC METABOLIC PANEL
Anion gap: 8 (ref 5–15)
BUN: 18 mg/dL (ref 8–23)
CO2: 19 mmol/L — ABNORMAL LOW (ref 22–32)
Calcium: 6.9 mg/dL — ABNORMAL LOW (ref 8.9–10.3)
Chloride: 113 mmol/L — ABNORMAL HIGH (ref 98–111)
Creatinine, Ser: 0.73 mg/dL (ref 0.44–1.00)
GFR, Estimated: >60 mL/min (ref >60)
Glucose, Bld: 84 mg/dL (ref 70–99)
Potassium: 3.4 mmol/L — ABNORMAL LOW (ref 3.5–5.1)
Sodium: 140 mmol/L (ref 135–145)

## 2023-01-20 LAB — ECHOCARDIOGRAM COMPLETE
AR max vel: 2.36 cm2
AV Area VTI: 2.14 cm2
AV Area mean vel: 2.45 cm2
AV Mean grad: 4 mmHg
AV Peak grad: 8.6 mmHg
Ao pk vel: 1.47 m/s
Area-P 1/2: 3.42 cm2
Height: 67 in
S' Lateral: 2.9 cm
Weight: 2310.4 oz

## 2023-01-20 LAB — C-REACTIVE PROTEIN: CRP: 5.6 mg/dL — ABNORMAL HIGH (ref <1.0)

## 2023-01-20 LAB — SEDIMENTATION RATE: Sed Rate: 75 mm/hr — ABNORMAL HIGH (ref 0–22)

## 2023-01-20 MED ORDER — FUROSEMIDE 10 MG/ML IJ SOLN
20.0000 mg | Freq: Every day | INTRAMUSCULAR | Status: DC
  Administered 2023-01-20 – 2023-01-21 (×2): 20 mg via INTRAVENOUS
  Filled 2023-01-20 (×2): qty 2

## 2023-01-20 MED ORDER — PANTOPRAZOLE SODIUM 40 MG PO TBEC
40.0000 mg | DELAYED_RELEASE_TABLET | Freq: Two times a day (BID) | ORAL | Status: DC
  Administered 2023-01-20 – 2023-01-21 (×3): 40 mg via ORAL
  Filled 2023-01-20 (×3): qty 1

## 2023-01-20 MED ORDER — COLCHICINE 0.6 MG PO TABS
0.6000 mg | ORAL_TABLET | Freq: Every day | ORAL | Status: DC
  Administered 2023-01-21 – 2023-01-22 (×2): 0.6 mg via ORAL
  Filled 2023-01-20 (×2): qty 1

## 2023-01-20 MED ORDER — INDOMETHACIN 25 MG PO CAPS
25.0000 mg | ORAL_CAPSULE | Freq: Three times a day (TID) | ORAL | Status: AC
  Administered 2023-01-20 – 2023-01-21 (×3): 25 mg via ORAL
  Filled 2023-01-20 (×4): qty 1

## 2023-01-20 NOTE — Progress Notes (Signed)
Patient was able to ambulate about 400 ft in the hallway. Oxygen saturations were 100% during the walk. Patient did not complain of any shortness of breath or pain. Stable gait.

## 2023-01-20 NOTE — Progress Notes (Signed)
  Received report of critical Hgb lab at 0345 vastly different from previous(evening)> pt assessed to be asymptomatic This RN expressed to the charge nurse concern for possible sample dilution and/or contamination, two successive samples were sent for retest and improved results obtained at 0408 and 0416 were communicated to MD on-call Dr Margo Aye. Most recent result indicated a Hgb of 7.9.

## 2023-01-20 NOTE — Progress Notes (Signed)
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  Patient was admitted with shortness of breath and chest pain.  EKG revealed mild diffuse ST segment elevation.  Echocardiogram revealed small to moderate circumferential pericardial effusion.  CRP is elevated at 5.6.  Patient is currently being treated for presumed pericarditis.  Input from the oncology team is appreciated.  Malignant pericardial effusion remains a possibility.  Patient is currently on colchicine 0.6 Mg p.o. once daily.  Cardiology team is gradually introducing indomethacin (25 Mg p.o. 3 times daily), as Lovenox is currently on hold.  Echocardiogram revealed: 1. Left ventricular ejection fraction, by estimation, is 60 to 65%. The  left ventricle has normal function. The left ventricle has no regional  wall motion abnormalities. Left ventricular diastolic parameters are  consistent with Grade I diastolic  dysfunction (impaired relaxation).   2. Right ventricular systolic function is normal. The right ventricular  size is normal. Tricuspid regurgitation signal is inadequate for assessing  PA pressure.   3. Left atrial size was mildly dilated.   4. Small to moderate circumferential pericardial effusion without  evidence of tamponade physiology. The IVC is dilated however no sigificant  respiratory variation or diastolic chamber collapse. Conider repeating  echo 48 hours to reassess for any  progression of effusion. . small to moderate. The pericardial effusion is  circumferential. There is no evidence of cardiac tamponade.   5. The mitral valve is normal in structure. No evidence of mitral valve  regurgitation. No evidence of mitral stenosis.   6. The aortic  valve is tricuspid. Aortic valve regurgitation is not  visualized. No aortic stenosis is present.   7. The inferior vena cava is dilated in size with <50% respiratory  variability, suggesting right atrial pressure of 15 mmHg.    01/20/2023: Patient seen.  No chest pain.  No shortness of breath.  No constitutional symptoms endorsed.   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 -Echocardiogram revealed small to moderate circumferential pericardial effusion with tamponade physiology. -CRP is elevated.  ESR is pending. -EKG revealed mild diffuse ST segment elevation. -Concerns for possible pericarditis.  Please see above documentation. -Patient is currently on colchicine. -Indomethacin is being gradually introduced (Lovenox is on hold. -Input from the cardiology team and oncology team is appreciated.  History of colon cancer. -Metastatic pericardial effusion remains an option. -Further workup and management will depend on above, including, but not limited to possible tapping of the pericardial fluid (we defer to oncology and cardiology teams). -Patient is currently chest pain-free. -Query need to repeat echocardiogram in the next 3 to 5 days (as suggested in the echo report)..  Cancer of ascending colon [facility]): -Patient is on immunotherapy. -Oncology team is directing care.     Anemia Pt with chronic anemia, hemoccult positive, likely in setting of her known colon cancer not having  achieved remission. -Hemoglobin of 5.9 g/dL earlier this morning, but improved to 7.9 g/dL. -Anemia is likely multifactorial. -Lovenox is  on hold. -Continue to monitor closely.   DVT prophylaxis: Lovenox is on hold. Code Status: DO NOT RESUSCITATE. Family Communication:  Disposition Plan: This will depend on hospital course.   Consultants:  Cardiology. Oncology.  Procedures:  Echocardiogram.  Please see report above.  Antimicrobials:  None.   Subjective: No chest pain. No shortness of breath.  Objective: Vitals:   01/20/23 0540 01/20/23 0843 01/20/23 1301 01/20/23 1643  BP: 118/60 134/67 (!) 141/67 132/62  Pulse: 91 86 86 86  Resp: 15 16 16 17   Temp: 99 F (37.2 C) 98.7 F (37.1 C) 98.2 F (36.8 C) 98.5 F (36.9 C)  TempSrc: Oral Oral Oral Oral  SpO2: 97% 99%  96%  Weight:      Height:        Intake/Output Summary (Last 24 hours) at 01/20/2023 1658 Last data filed at 01/20/2023 0600 Gross per 24 hour  Intake --  Output 300 ml  Net -300 ml   Filed Weights   01/19/23 1738 01/19/23 2314  Weight: 65.3 kg 65.5 kg    Examination:  General exam: Appears calm and comfortable.  Patient is pale. Respiratory system: Clear to auscultation.  Cardiovascular system: S1 & S2 heard Gastrointestinal system: Abdomen is soft and nontender.  Central nervous system: Alert and oriented.  Patient moves all extremities.   Extremities: No leg edema.  Data Reviewed: I have personally reviewed following labs and imaging studies  CBC: Recent Labs  Lab 01/19/23 1145 01/19/23 1922 01/20/23 0300 01/20/23 0340 01/20/23 0400  WBC 7.0 6.9 7.5 5.4 6.3  NEUTROABS 5.1 4.8  --   --   --   HGB 8.7* 8.4* 5.9* 6.7* 7.9*  HCT 26.9* 27.2* 18.7* 21.6* 24.8*  MCV 87.1 88.6 89.0 88.2 87.6  PLT 256 231 246 192 226   Basic Metabolic Panel: Recent Labs  Lab 01/19/23 1145 01/19/23 1922 01/20/23 0300  NA 139 140 140  K 4.6 4.2 3.4*  CL 109 108 113*  CO2 22 21* 19*  GLUCOSE 89 91 84  BUN 28* 22 18  CREATININE 0.76 0.80 0.73  CALCIUM 9.0 8.8* 6.9*  MG 1.7  --   --    GFR: Estimated Creatinine Clearance: 57.3  mL/min (by C-G formula based on SCr of 0.73 mg/dL). Liver Function Tests: Recent Labs  Lab 01/19/23 1145 01/19/23 1922  AST 10* 12*  ALT 7 11  ALKPHOS 102 101  BILITOT 0.4 0.6  PROT 6.5 6.0*  ALBUMIN 3.4* 2.6*   No results for input(s): "LIPASE", "AMYLASE" in the last 168 hours. No results for input(s): "AMMONIA" in the last 168 hours. Coagulation Profile: No results for input(s): "INR", "PROTIME" in the last 168 hours. Cardiac Enzymes: No results for input(s): "CKTOTAL", "CKMB", "CKMBINDEX", "TROPONINI" in the last 168 hours. BNP (last 3 results) No results for input(s): "PROBNP" in the last 8760 hours. HbA1C: No results for input(s): "HGBA1C" in the last 72 hours. CBG: No results for input(s): "GLUCAP" in the last 168 hours. Lipid Profile: No results for input(s): "CHOL", "HDL", "LDLCALC", "TRIG", "CHOLHDL", "LDLDIRECT" in the last 72 hours. Thyroid Function Tests: No results for input(s): "TSH", "T4TOTAL", "FREET4", "T3FREE", "THYROIDAB" in the last 72 hours. Anemia Panel: No results for input(s): "VITAMINB12", "FOLATE", "FERRITIN", "TIBC", "IRON", "RETICCTPCT" in the last 72 hours. Urine analysis:    Component Value Date/Time   COLORURINE YELLOW 12/22/2021 1027  APPEARANCEUR CLEAR 12/22/2021 1027   LABSPEC 1.029 12/22/2021 1027   PHURINE 6.0 12/22/2021 1027   GLUCOSEU NEGATIVE 12/22/2021 1027   HGBUR TRACE (A) 12/22/2021 1027   BILIRUBINUR NEGATIVE 12/22/2021 1027   KETONESUR NEGATIVE 12/22/2021 1027   PROTEINUR >300 (A) 01/10/2022 1020   NITRITE NEGATIVE 12/22/2021 1027   LEUKOCYTESUR NEGATIVE 12/22/2021 1027   Sepsis Labs: @LABRCNTIP (procalcitonin:4,lacticidven:4)  ) Recent Results (from the past 240 hour(s))  Resp panel by RT-PCR (RSV, Flu A&B, Covid) Anterior Nasal Swab     Status: None   Collection Time: 01/19/23  7:23 PM   Specimen: Anterior Nasal Swab  Result Value Ref Range Status   SARS Coronavirus 2 by RT PCR NEGATIVE NEGATIVE Final   Influenza A  by PCR NEGATIVE NEGATIVE Final   Influenza B by PCR NEGATIVE NEGATIVE Final    Comment: (NOTE) The Xpert Xpress SARS-CoV-2/FLU/RSV plus assay is intended as an aid in the diagnosis of influenza from Nasopharyngeal swab specimens and should not be used as a sole basis for treatment. Nasal washings and aspirates are unacceptable for Xpert Xpress SARS-CoV-2/FLU/RSV testing.  Fact Sheet for Patients: BloggerCourse.com  Fact Sheet for Healthcare Providers: SeriousBroker.it  This test is not yet approved or cleared by the [place] FDA and has been authorized for detection and/or diagnosis of SARS-CoV-2 by FDA under an Emergency Use Authorization (EUA). This EUA will remain in effect (meaning this test can be used) for the duration of the COVID-19 declaration under Section 564(b)(1) of the Act, 21 U.S.C. section 360bbb-3(b)(1), unless the authorization is terminated or revoked.     Resp Syncytial Virus by PCR NEGATIVE NEGATIVE Final    Comment: (NOTE) Fact Sheet for Patients: BloggerCourse.com  Fact Sheet for Healthcare Providers: SeriousBroker.it  This test is not yet approved or cleared by the [place] FDA and has been authorized for detection and/or diagnosis of SARS-CoV-2 by FDA under an Emergency Use Authorization (EUA). This EUA will remain in effect (meaning this test can be used) for the duration of the COVID-19 declaration under Section 564(b)(1) of the Act, 21 U.S.C. section 360bbb-3(b)(1), unless the authorization is terminated or revoked.  Performed at [facility], [address] [place], [place] [address]          Radiology Studies: ECHOCARDIOGRAM COMPLETE  Result Date: 01/20/2023    ECHOCARDIOGRAM REPORT   Patient Name:   Stacy Burns Date of Exam: 01/20/2023 Medical Rec #:  [id number]    Height:       67.0 in Accession #:    [id number]   Weight:        144.4 lb Date of Birth:  January 16, 1946     BSA:          1.761 m Patient Age:    77 years     BP:           134/67 mmHg Patient Gender: F            HR:           80 bpm. Exam Location:  Inpatient Procedure: 2D Echo, Cardiac Doppler and Color Doppler Indications:    Pericardial effusion I31.3  History:        Patient has prior history of Echocardiogram examinations, most                 recent 12/16/2019. Pericardial Disease.  Sonographer:    Lucendia Herrlich Referring Phys: [phone] JARED M GARDNER IMPRESSIONS  1. Left ventricular ejection fraction, by  estimation, is 60 to 65%. The left ventricle has normal function. The left ventricle has no regional wall motion abnormalities. Left ventricular diastolic parameters are consistent with Grade I diastolic dysfunction (impaired relaxation).  2. Right ventricular systolic function is normal. The right ventricular size is normal. Tricuspid regurgitation signal is inadequate for assessing PA pressure.  3. Left atrial size was mildly dilated.  4. Small to moderate circumferential pericardial effusion without evidence of tamponade physiology. The IVC is dilated however no sigificant respiratory variation or diastolic chamber collapse. Conider repeating echo 48 hours to reassess for any progression of effusion. . small to moderate. The pericardial effusion is circumferential. There is no evidence of cardiac tamponade.  5. The mitral valve is normal in structure. No evidence of mitral valve regurgitation. No evidence of mitral stenosis.  6. The aortic valve is tricuspid. Aortic valve regurgitation is not visualized. No aortic stenosis is present.  7. The inferior vena cava is dilated in size with <50% respiratory variability, suggesting right atrial pressure of 15 mmHg. FINDINGS  Left Ventricle: Left ventricular ejection fraction, by estimation, is 60 to 65%. The left ventricle has normal function. The left ventricle has no regional wall motion abnormalities. The left ventricular  internal cavity size was normal in size. There is  no left ventricular hypertrophy. Left ventricular diastolic parameters are consistent with Grade I diastolic dysfunction (impaired relaxation). Normal left ventricular filling pressure. Right Ventricle: The right ventricular size is normal. Right vetricular wall thickness was not well visualized. Right ventricular systolic function is normal. Tricuspid regurgitation signal is inadequate for assessing PA pressure. Left Atrium: Left atrial size was mildly dilated. Right Atrium: Right atrial size was normal in size. Pericardium: Small to moderate circumferential pericardial effusion without evidence of tamponade physiology. The IVC is dilated however no sigificant respiratory variation or diastolic chamber collapse. Conider repeating echo 48 hours to reassess for any progression of effusion. Small to moderate. The pericardial effusion is circumferential. There is no evidence of cardiac tamponade. Mitral Valve: The mitral valve is normal in structure. No evidence of mitral valve regurgitation. No evidence of mitral valve stenosis. Tricuspid Valve: The tricuspid valve is normal in structure. Tricuspid valve regurgitation is not demonstrated. No evidence of tricuspid stenosis. Aortic Valve: The aortic valve is tricuspid. Aortic valve regurgitation is not visualized. No aortic stenosis is present. Aortic valve mean gradient measures 4.0 mmHg. Aortic valve peak gradient measures 8.6 mmHg. Aortic valve area, by VTI measures 2.14 cm. Pulmonic Valve: The pulmonic valve was not well visualized. Pulmonic valve regurgitation is not visualized. No evidence of pulmonic stenosis. Aorta: The aortic root is normal in size and structure. Venous: The inferior vena cava is dilated in size with less than 50% respiratory variability, suggesting right atrial pressure of 15 mmHg. IAS/Shunts: No atrial level shunt detected by color flow Doppler.  LEFT VENTRICLE PLAX 2D LVIDd:         4.40 cm    Diastology LVIDs:         2.90 cm   LV e' medial:    7.62 cm/s LV PW:         0.80 cm   LV E/e' medial:  12.8 LV IVS:        0.90 cm   LV e' lateral:   7.07 cm/s LVOT diam:     2.00 cm   LV E/e' lateral: 13.8 LV SV:         59 LV SV Index:   34 LVOT Area:  3.14 cm  RIGHT VENTRICLE             IVC RV S prime:     13.10 cm/s  IVC diam: 2.60 cm TAPSE (M-mode): 1.7 cm LEFT ATRIUM             Index        RIGHT ATRIUM          Index LA diam:        2.70 cm 1.53 cm/m   RA Area:     9.33 cm LA Vol (A2C):   71.3 ml 40.49 ml/m  RA Volume:   17.40 ml 9.88 ml/m LA Vol (A4C):   69.1 ml 39.24 ml/m LA Biplane Vol: 73.1 ml 41.52 ml/m  AORTIC VALVE AV Area (Vmax):    2.36 cm AV Area (Vmean):   2.45 cm AV Area (VTI):     2.14 cm AV Vmax:           146.66 cm/s AV Vmean:          89.787 cm/s AV VTI:            0.278 m AV Peak Grad:      8.6 mmHg AV Mean Grad:      4.0 mmHg LVOT Vmax:         110.00 cm/s LVOT Vmean:        69.900 cm/s LVOT VTI:          0.189 m LVOT/AV VTI ratio: 0.68  AORTA Ao Root diam: 3.30 cm Ao Asc diam:  2.80 cm MITRAL VALVE MV Area (PHT): 3.42 cm     SHUNTS MV Decel Time: 222 msec     Systemic VTI:  0.19 m MV E velocity: 97.40 cm/s   Systemic Diam: 2.00 cm MV A velocity: 111.00 cm/s MV E/A ratio:  0.88 Dina Rich MD Electronically signed by Dina Rich MD Signature Date/Time: 01/20/2023/1:01:14 PM    Final    DG Chest 2 View  Result Date: 01/19/2023 CLINICAL DATA:  SOB, ?pericardial effusion EXAM: CHEST - 2 VIEW COMPARISON:  Same day chest CT. FINDINGS: Globular appearance of the heart. No consolidation. Trace left pleural effusion. Streaky overlying left basilar opacities. No visible pneumothorax. Right IJ approach central venous catheter with the tip projecting at the mid SVC. IMPRESSION: 1. No consolidation. 2. Globular appearance of the heart, compatible with known pericardial effusion. 3. Trace left pleural effusion with overlying atelectasis. Electronically Signed   By: Feliberto Harts M.D.   On: 01/19/2023 20:08   CT Angio Chest Pulmonary Embolism (PE) W or WO Contrast  Result Date: 01/19/2023 CLINICAL DATA:  History of metastatic colon cancer with cough, dyspnea, chest pain and fever. EXAM: CT ANGIOGRAPHY CHEST WITH CONTRAST TECHNIQUE: Multidetector CT imaging of the chest was performed using the standard protocol during bolus administration of intravenous contrast. Multiplanar CT image reconstructions and MIPs were obtained to evaluate the vascular anatomy. RADIATION DOSE REDUCTION: This exam was performed according to the departmental dose-optimization program which includes automated exposure control, adjustment of the mA and/or kV according to patient size and/or use of iterative reconstruction technique. CONTRAST:  80mL OMNIPAQUE IOHEXOL 350 MG/ML SOLN COMPARISON:  11/03/2022 FINDINGS: Cardiovascular: New moderate size pericardial effusion. Thoracic aorta is normal in caliber without aneurysm or dissection. There is calcified plaque throughout the thoracic aorta. Pulmonary arterial system is well opacified without evidence of emboli. Remaining vascular structures are unremarkable. Mediastinum/Nodes: A few small subcentimeter shotty mediastinal lymph nodes. No significant mediastinal or  hilar adenopathy. Remaining mediastinal structures are unchanged. Lungs/Pleura: Lungs are adequately inflated. Mild biapical pleural thickening unchanged. Several calcified granulomas are present bilaterally with the largest measuring 8 mm over the posterior left lower lobe. No acute airspace process. Airways are within normal. Mild scarring left base with small amount of left pleural fluid. Upper Abdomen: Nodular contour to the liver suggesting cirrhosis. Previously seen liver hypodensities not well demonstrated on today's exam. Calcified plaque over the abdominal aorta. Remaining visualized images over the upper abdomen are unchanged. Musculoskeletal: No focal abnormality. Review of the MIP  images confirms the above findings. IMPRESSION: 1. No evidence of pulmonary embolism. 2. New moderate size pericardial effusion. 3. Mild scarring left base with small amount of left pleural fluid. 4. Evidence of prior granulomatous disease. 5. Nodular contour to the liver suggesting cirrhosis. Previously seen liver hypodensities not well demonstrated on today's exam. 6. Aortic atherosclerosis. Aortic Atherosclerosis (ICD10-I70.0). Electronically Signed   By: Elberta Fortis M.D.   On: 01/19/2023 14:21        Scheduled Meds:  Chlorhexidine Gluconate Cloth  6 each Topical Q0600   [START ON 01/21/2023] colchicine  0.6 mg Oral Daily   ferrous sulfate  325 mg Oral Q breakfast   furosemide  20 mg Intravenous Daily   indomethacin  25 mg Oral TID WC   magnesium oxide  400 mg Oral BID   pantoprazole  40 mg Oral BID   potassium chloride  10 mEq Oral BID   Continuous Infusions:   LOS: 0 days    Time spent: 35 minutes.    Berton Mount, MD  [facility] Pager #: [phone] 7PM-7AM contact night coverage as above

## 2023-01-20 NOTE — Progress Notes (Signed)
  Echocardiogram 2D Echocardiogram has been performed.  Lucendia Herrlich 01/20/2023, 12:39 PM

## 2023-01-20 NOTE — Progress Notes (Signed)
IP PROGRESS NOTE  Subjective:   Ms. Huschka was admitted yesterday with dyspnea/orthopnea.  She continues to have orthopnea.  She no longer has chest discomfort.  The emergency room provider and cardiology feel she has pericarditis.  Ms. Snawder has history of dark stool since beginning iron therapy.  She has not noted bleeding.  Objective: Vital signs in last 24 hours: Blood pressure 134/67, pulse 86, temperature 98.7 F (37.1 C), temperature source Oral, resp. rate 16, height 5\' 7"  (1.702 m), weight 144 lb 6.4 oz (65.5 kg), SpO2 99 %.  Intake/Output from previous day: 06/07 0701 - 06/08 0700 In: -  Out: 300 [Urine:300]  Physical Exam:  HEENT: No thrush or ulcers Lungs: Clear bilaterally Cardiac: Regular rate and rhythm Abdomen: No hepatosplenomegaly Extremities: No leg edema  Portacath/PICC-without erythema  Lab Results: Recent Labs    01/20/23 0340 01/20/23 0400  WBC 5.4 6.3  HGB 6.7* 7.9*  HCT 21.6* 24.8*  PLT 192 226    BMET Recent Labs    01/19/23 1922 01/20/23 0300  NA 140 140  K 4.2 3.4*  CL 108 113*  CO2 21* 19*  GLUCOSE 91 84  BUN 22 18  CREATININE 0.80 0.73  CALCIUM 8.8* 6.9*    Lab Results  Component Value Date   CEA1 54.57 (H) 12/30/2020   CEA 18.27 (H) 01/19/2023    Studies/Results: DG Chest 2 View  Result Date: 01/19/2023 CLINICAL DATA:  SOB, ?pericardial effusion EXAM: CHEST - 2 VIEW COMPARISON:  Same day chest CT. FINDINGS: Globular appearance of the heart. No consolidation. Trace left pleural effusion. Streaky overlying left basilar opacities. No visible pneumothorax. Right IJ approach central venous catheter with the tip projecting at the mid SVC. IMPRESSION: 1. No consolidation. 2. Globular appearance of the heart, compatible with known pericardial effusion. 3. Trace left pleural effusion with overlying atelectasis. Electronically Signed   By: Feliberto Harts M.D.   On: 01/19/2023 20:08   CT Angio Chest Pulmonary Embolism (PE) W or WO  Contrast  Result Date: 01/19/2023 CLINICAL DATA:  History of metastatic colon cancer with cough, dyspnea, chest pain and fever. EXAM: CT ANGIOGRAPHY CHEST WITH CONTRAST TECHNIQUE: Multidetector CT imaging of the chest was performed using the standard protocol during bolus administration of intravenous contrast. Multiplanar CT image reconstructions and MIPs were obtained to evaluate the vascular anatomy. RADIATION DOSE REDUCTION: This exam was performed according to the departmental dose-optimization program which includes automated exposure control, adjustment of the mA and/or kV according to patient size and/or use of iterative reconstruction technique. CONTRAST:  80mL OMNIPAQUE IOHEXOL 350 MG/ML SOLN COMPARISON:  11/03/2022 FINDINGS: Cardiovascular: New moderate size pericardial effusion. Thoracic aorta is normal in caliber without aneurysm or dissection. There is calcified plaque throughout the thoracic aorta. Pulmonary arterial system is well opacified without evidence of emboli. Remaining vascular structures are unremarkable. Mediastinum/Nodes: A few small subcentimeter shotty mediastinal lymph nodes. No significant mediastinal or hilar adenopathy. Remaining mediastinal structures are unchanged. Lungs/Pleura: Lungs are adequately inflated. Mild biapical pleural thickening unchanged. Several calcified granulomas are present bilaterally with the largest measuring 8 mm over the posterior left lower lobe. No acute airspace process. Airways are within normal. Mild scarring left base with small amount of left pleural fluid. Upper Abdomen: Nodular contour to the liver suggesting cirrhosis. Previously seen liver hypodensities not well demonstrated on today's exam. Calcified plaque over the abdominal aorta. Remaining visualized images over the upper abdomen are unchanged. Musculoskeletal: No focal abnormality. Review of the MIP images confirms the above  findings. IMPRESSION: 1. No evidence of pulmonary embolism. 2. New  moderate size pericardial effusion. 3. Mild scarring left base with small amount of left pleural fluid. 4. Evidence of prior granulomatous disease. 5. Nodular contour to the liver suggesting cirrhosis. Previously seen liver hypodensities not well demonstrated on today's exam. 6. Aortic atherosclerosis. Aortic Atherosclerosis (ICD10-I70.0). Electronically Signed   By: Elberta Fortis M.D.   On: 01/19/2023 14:21    Medications: I have reviewed the patient's current medications.  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  Cirrhosis.  Innumerable liver lesions. Colonoscopy 12/18/2019-ulcerated partially obstructing large mass in the mid ascending colon.  Biopsy invasive adenocarcinoma; preserved expression major MMR proteins Upper endoscopy 12/18/2019-normal esophagus, stomach, examined duodenum CEA 12/19/2019-8,923 Biopsy liver lesion 12/23/2019-adenocarcinoma Foundation 1-K-ras wild-type; tumor mutation burden greater than or equal to 10; microsatellite stable; BRAF V600E Cycle 1 FOLFOX 01/01/2020 Cycle 2 FOLFOX 01/15/2020  Cycle 3 FOLFOX 01/29/2020, Udenyca added Cycle 4 FOLFOX 02/12/2020, Udenyca held Cycle 5 FOLFOX 02/26/2020, Udenyca Cycle 6 FOLFOX 03/11/2020, Udenyca held CT abdomen/pelvis 03/16/2020-stable diffuse liver lesions, stable strictured appearing ascending colon, possible pericolonic implant, vague left lower lobe nodule Cycle 1 FOLFIRI/Avastin 04/06/2020 Cycle 2 FOLFIRI/Avastin 04/21/2020 Cycle 3 FOLFIRI/Avastin 05/06/2020 Cycle 4 FOLFIRI/Avastin 05/20/2020 Cycle 5 FOLFIRI/Avastin 06/03/2020 CTs 06/15/2020-mild to moderate improvement in hepatic metastasis.  No new or progressive disease.  Narrowing within the proximal ascending colon with upstream mild cecal and terminal ileum dilatation, similar. Cycle 6 FOLFIRI/Avastin 06/17/2020 Cycle 7 FOLFIRI/Avastin 07/01/2020 Cycle 8  FOLFIRI/Avastin 07/15/2020 Cycle 9 FOLFIRI/Avastin 07/29/2020-5-FU bolus eliminated, 5-FU infusion and irinotecan dose reduced Cycle 10 FOLFIRI/Avastin 08/12/2020 CTs 08/23/2020- mild residual wall thickening involving the cecum/ascending colon.  Numerous liver metastases improved. Cycle 11 FOLFIRI/Avastin 08/25/2020 Cycle 12 FOLFIRI/Avastin 09/16/2020 Cycle 13 FOLFIRI/Avastin 10/07/2020 Cycle 14 FOLFIRI/Avastin 10/28/2020 Cycle 15 FOLFIRI/Avastin 11/18/2020 CT abdomen/pelvis 12/06/2020-mild improvement in multifocal hepatic metastases, wall thickening at the ascending colon with pericolonic inflammatory changes Cycle 16 FOLFIRI/Avastin 12/09/2020 Cycle 17 FOLFIRI/Avastin 12/30/2020 Cycle 18 FOLFIRI/Avastin 01/20/2021 Cycle 19 FOLFIRI/Avastin 02/10/2021 Cycle 20 FOLFIRI/Avastin 03/03/2021 Cycle 21 FOLFIRI/Avastin 03/24/2021 Cycle 22 FOLFIRI/Avastin 04/14/2021 CTs 05/04/2021-slight improvement in hepatic metastatic disease. Cycle 23 FOLFIRI/Avastin 05/05/2021 Cycle 24 FOLFIRI/Avastin 05/25/2021 Cycle 25 FOLFIRI/Avastin 06/15/2021 Cycle 26 FOLFIRI/Avastin 07/13/2021 Cycle 27 FOLFIRI/Avastin 08/02/2021 CTs 08/30/2021-majority of liver lesions are unchanged in size, 1 lesion adjacent to the gallbladder has increased, no adenopathy, persistent pericolonic stranding and peritoneal thickening at the cecum Progressive rise in CEA 08/31/2021-treatment changed to Xeloda/bevacizumab, began [place] 09/22/2021 CEA stable 11/03/2021 Xeloda/bevacizumab continued CTs 01/10/2022-increase in size of some of the liver lesions, others are stable, new tiny right lower lobe nodule, increased dilation of distal small bowel with increased wall thickening at the terminal ileum and ileocecal valve with 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, stable bilateral paracolic gutter peritoneal thickening Panitumumab/ Encorafenib 01/12/2022 (encorafenib 01/19/2022) Panitumumab 02/02/2022 Panitumumab 02/20/2022 Panitumumab  03/06/2022 Panitumumab 03/20/2022 Panitumumab 04/03/2022 CTs 04/14/2022-decrease size of liver metastases, new borderline subcarinal adenopathy, improved peritoneal thickening, cirrhosis or pseudocirrhosis, persistent dilated cecum Encorafenib and Panitumumab continued Panitumumab 04/18/2022 Panitumumab 05/05/2022 Panitumumab 05/19/2022 Panitumumab 06/02/2022 Panitumumab 06/16/2022, 4g IV mag and begin oral mag po once daily  Panitumumab 06/30/2022 Panitumumab 07/14/2022, 2 g IV magnesium, increase oral magnesium to 400 mg twice daily Panitumumab 07/28/2022, 2 g IV magnesium CTs 07/28/2022-no change in hepatic metastases, no new signs of metastatic disease, persistent mural thickening at the terminal ileum/cecum mild splenomegaly unchanged Panitumumab 08/18/2022 Panitumumab 09/01/2022 Panitumumab  09/15/2022 Panitumumab 09/29/2022 Panitumumab 10/13/2022 Panitumumab 10/27/2022 CTs 11/03/2022-unchanged hepatic metastases, no new lesions, 3.7 cm soft tissue density at the medial left kidney etiology unclear, improvement in wall thickening at the ileocecal junction development of appendiceal dilatation Panitumumab 11/10/2022 Panitumumab 11/24/2022 Panitumumab 12/08/2022 Panitumumab 12/22/2022 Panitumumab 01/05/2023 Anemia secondary to GI blood loss, on oral iron BID  Cirrhosis 12/26/2019-hospital admission for right lower extremity DVT and GI bleed, treated with heparin anticoagulation beginning 12/26/2019, converted to Lovenox at discharge 12/29/2019 Lovenox reduced to 40 mg daily 02/10/2021 secondary to bruising and nosebleeding History of low-grade fever-likely "tumor" fever COVID-19 infection January 2021 Admission 01/19/2023 with dyspnea/orthopnea, pericardial effusion, EKG changes suggestive of pericarditis Anemia secondary to chemotherapy, chronic disease, and phlebotomy   Ms. Courser has metastatic colon cancer.  Her clinical status has been stable and there has been no evidence of disease progression while on  treatment with encorafenib and panitumumab.  She presents new onset dyspnea, orthopnea, and she reports 2 days of chest pain.  There is a new moderate pericardial effusion on chest CT.  The effusion could be related to pericarditis or she may have a malignant effusion.  Cardiology has been consulted for an echocardiogram and to consider a pericardiocentesis.  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  The hemoglobin has not changed significantly over the past several months.  The hemoglobin of 5.9 this morning was likely secondary to error from a Port-A-Cath blood draw.  Recommendations: 1.  Hold encorafenib 2.  Echocardiogram 3.  Treatment for pericarditis per cardiology 4.  Okay to hold Lovenox for a procedure 5.  Plan to resume encorafenib and panitumumab as an outpatient     LOS: 0 days   Thornton Papas, MD   01/20/2023, 9:19 AM

## 2023-01-20 NOTE — Consult Note (Signed)
CARDIOLOGY CONSULT NOTE  Patient ID: Stacy Burns MRN: [id number] DOB/AGE: 02-15-1946 77 y.o.  Admit date: 01/19/2023 Attending physician: Barnetta Chapel, MD Primary Physician:  Lance Bosch, NP Outpatient Cardiology Provider: NA Inpatient Cardiologist: Tessa Lerner, DO, [facility]  Reason of consultation: Pericardial effusion w/ chest pain and shortness of breath Referring physician: Dr. Jearld Burns  Chief complaint: shortness of breath and chest pain.   HPI:  Stacy Burns is a 77 y.o. Caucasian female who presents with a chief complaint of "shortness of breath and chest pain." Her past medical history and cardiovascular risk factors include: Hx of DVT 2021, Colon cancer with metastasis dx 2021 on treatment and no prior surgery, anemia, former smoker.   Patient was at her oncologist office visit and voiced concerns for shortness of breath and chest pain CT PE protocol study was performed which was negative for pulmonary embolism but newly detected pericardial effusion.  Patient was transferred to the emergency room department for further evaluation and management.  Shortness of breath: Chronic according to the patient. Occurs intermittently. Has noticed symptoms to be more prominent when her hemoglobin trends low. Has been on iron supplementation and since then has had black stools. Last transfusion was 3 years ago. Planning anemia is likely multifactorial.  Chest pain: Initially occurred on May 28/29th 2024. Across the anterior precordium. Positional -noticeable when turning side-to-side. Short-lived. Self-limited. Attributed to heartburn initially. No change with effort related activities. Did not improve with resting. No active discomfort  Patient denies any fevers or chills. Patient has not been hypotensive. Has been on prophylactic dose of Lovenox given her history of DVT and colon cancer.  ALLERGIES: Allergies  Allergen Reactions   Milk-Related Compounds Nausea Only and  Other (See Comments)    Delayed reaction if too much consumed    Penicillins Anaphylaxis and Other (See Comments)    Both parents had anaphylactic reactions to this   Pineapple Swelling and Other (See Comments)    Tongue swells and causes acid bumps there, also   Chocolate Other (See Comments)    Nasal congestion   Hydrocodone Nausea And Vomiting   Other Itching, Rash and Other (See Comments)    "Most all antibiotics and OTC cold meds break me out" Allergic to ALL NUTS, also = Itching   Sulfa Antibiotics Rash and Other (See Comments)    Severe rash    PAST MEDICAL HISTORY: Past Medical History:  Diagnosis Date   colon ca dx'd 12/2019   COVID-19 virus infection 08/2019   not hospitalized.     Severe anemia 12/17/2019    PAST SURGICAL HISTORY: Past Surgical History:  Procedure Laterality Date   BIOPSY  12/18/2019   Procedure: BIOPSY;  Surgeon: Tressia Danas, MD;  Location: [facility] ENDOSCOPY;  Service: Gastroenterology;;   COLONOSCOPY WITH PROPOFOL N/A 12/18/2019   Procedure: COLONOSCOPY WITH PROPOFOL;  Surgeon: Tressia Danas, MD;  Location: [facility] ENDOSCOPY;  Service: Gastroenterology;  Laterality: N/A;   ESOPHAGOGASTRODUODENOSCOPY (EGD) WITH PROPOFOL N/A 12/18/2019   Procedure: ESOPHAGOGASTRODUODENOSCOPY (EGD) WITH PROPOFOL;  Surgeon: Tressia Danas, MD;  Location: [facility] ENDOSCOPY;  Service: Gastroenterology;  Laterality: N/A;   PORTACATH PLACEMENT N/A 12/19/2019   Procedure: INSERTION PORT-A-CATH WITH ULTRASOUND;  Surgeon: Stacy Loron, MD;  Location: [facility] OR;  Service: General;  Laterality: N/A;   SUBMUCOSAL TATTOO INJECTION  12/18/2019   Procedure: SUBMUCOSAL TATTOO INJECTION;  Surgeon: Tressia Danas, MD;  Location: [facility] ENDOSCOPY;  Service: Gastroenterology;;   TUBAL LIGATION      FAMILY HISTORY: The patient's family  history includes Diabetes Mellitus II in her mother; Leukemia in her father; Multiple myeloma in her mother; Muscular dystrophy in her brother.   SOCIAL HISTORY:   The patient  reports that she quit smoking about 38 years ago. Her smoking use included cigarettes. She has never used smokeless tobacco. She reports that she does not currently use alcohol. She reports that she does not use drugs.  MEDICATIONS: Current Outpatient Medications  Medication Instructions   albuterol (VENTOLIN HFA) 108 (90 Base) MCG/ACT inhaler INHALE 2 PUFFS INTO THE LUNGS FOUR TIMES DAILY AS NEEDED   cyanocobalamin (VITAMIN B12) 5,000 mcg, Oral, Daily   doxycycline (VIBRA-TABS) 100 MG tablet TAKE 1 TABLET(100 MG) BY MOUTH TWICE DAILY   encorafenib (BRAFTOVI) 75 MG capsule TAKE 4 CAPSULES ONCE DAILY   enoxaparin (LOVENOX) 40 MG/0.4ML injection INJECT 0.4MLS INTO THE SKIN DAILY   ferrous sulfate (FEROSUL) 325 (65 FE) MG tablet TAKE 1 TABLET(325 MG) BY MOUTH DAILY WITH BREAKFAST   lidocaine-prilocaine (EMLA) cream 1 Application, Topical, As needed   magic mouthwash SOLN 5-10 mLs, Oral, 4 times daily PRN, Swish and spit   magnesium oxide (MAG-OX) 400 mg, Oral, 2 times daily   nystatin (MYCOSTATIN/NYSTOP) powder 1 Application, Topical, 3 times daily   oxyCODONE-acetaminophen (PERCOCET/ROXICET) 5-325 MG tablet 1 tablet, Oral, Every 8 hours PRN   potassium chloride (KLOR-CON) 10 MEQ tablet TAKE 1 TABLET(10 MEQ) BY MOUTH TWICE DAILY   Vitamin D3 1,000 Units, Oral, Daily    REVIEW OF SYSTEMS: Review of Systems  Cardiovascular:  Positive for chest pain (Last episode this past Wednesday 01/18/2023.  No active pain). Negative for claudication, dyspnea on exertion, irregular heartbeat, leg swelling, near-syncope, orthopnea, palpitations, paroxysmal nocturnal dyspnea and syncope.  Respiratory:  Positive for shortness of breath (Intermittent).   Hematologic/Lymphatic: Bleeding problem: Known history of anemia.  Musculoskeletal:  Negative for muscle cramps and myalgias.  Neurological:  Negative for dizziness and light-headedness.    PHYSICAL EXAMINATION: PHYSICAL EXAM: Temp:  [97.9 F  (36.6 C)-99 F (37.2 C)] 98.2 F (36.8 C) (06/08 1301) Pulse Rate:  [86-96] 86 (06/08 1301) Resp:  [15-27] 16 (06/08 1301) BP: (118-148)/(54-67) 141/67 (06/08 1301) SpO2:  [94 %-100 %] 99 % (06/08 0843) Weight:  [65.3 kg-65.5 kg] 65.5 kg (06/07 2314)  Intake/Output:  Intake/Output Summary (Last 24 hours) at 01/20/2023 1510 Last data filed at 01/20/2023 0600 Gross per 24 hour  Intake --  Output 300 ml  Net -300 ml     Net IO Since Admission: -300 mL [01/20/23 1510]  Weights:     01/19/2023   11:14 PM 01/19/2023    5:38 PM 01/19/2023   12:51 PM  Last 3 Weights  Weight (lbs) 144 lb 6.4 oz 144 lb 144 lb 3.2 oz  Weight (kg) 65.499 kg 65.318 kg 65.409 kg     Physical Exam  Constitutional: No distress. She appears chronically ill.  hemodynamically stable.   Neck: No JVD present.  Cardiovascular: Normal rate, regular rhythm, S1 normal, S2 normal, intact distal pulses and normal pulses. Exam reveals no gallop, no S3 and no S4.  No murmur heard. Pulses:      Carotid pulses are  on the right side with bruit. Pulmonary/Chest: Effort normal and breath sounds normal. No stridor. She has no wheezes. She has no rales.  Port right anterior chest wall  Abdominal: Soft. Bowel sounds are normal. She exhibits no distension. There is no abdominal tenderness.  Musculoskeletal:        General: No edema.  Cervical back: Neck supple.  Neurological: She is alert and oriented to person, place, and time. She has intact cranial nerves (2-12).  Skin: Skin is warm and moist.    LAB RESULTS: Chemistry Recent Labs  Lab 01/19/23 1145 01/19/23 1922 01/20/23 0300  NA 139 140 140  K 4.6 4.2 3.4*  CL 109 108 113*  CO2 22 21* 19*  GLUCOSE 89 91 84  BUN 28* 22 18  CREATININE 0.76 0.80 0.73  CALCIUM 9.0 8.8* 6.9*  PROT 6.5 6.0*  --   ALBUMIN 3.4* 2.6*  --   AST 10* 12*  --   ALT 7 11  --   ALKPHOS 102 101  --   BILITOT 0.4 0.6  --   GFRNONAA >60 >60 >60  ANIONGAP 8 11 8      Hematology Recent Labs  Lab 01/20/23 0300 01/20/23 0340 01/20/23 0400  WBC 7.5 5.4 6.3  RBC 2.10* 2.45* 2.83*  HGB 5.9* 6.7* 7.9*  HCT 18.7* 21.6* 24.8*  MCV 89.0 88.2 87.6  MCH 28.1 27.3 27.9  MCHC 31.6 31.0 31.9  RDW 14.3 14.6 14.4  PLT 246 192 226   High Sensitivity Troponin:   Recent Labs  Lab 01/19/23 1922 01/19/23 2127  TROPONINIHS 4 4     Cardiac EnzymesNo results for input(s): "TROPONINI" in the last 168 hours. No results for input(s): "TROPIPOC" in the last 168 hours.  BNP Recent Labs  Lab 01/19/23 1923  BNP 114.9*   No results found for: "ESRSEDRATE", "POCTSEDRATE"  No results found for: "CRP"   DDimer No results for input(s): "DDIMER" in the last 168 hours.  Hemoglobin A1c: No results found for: "HGBA1C", "MPG" TSH No results for input(s): "TSH" in the last 8760 hours. Lipid Panel No results found for: "CHOL", "HDL", "LDLCALC", "LDLDIRECT", "TRIG", "CHOLHDL" Drugs of Abuse  No results found for: "LABOPIA", "COCAINSCRNUR", "LABBENZ", "AMPHETMU", "THCU", "LABBARB"    CARDIAC DATABASE: EKG: January 19, 2023: Sinus rhythm, 86 bpm, right bundle branch block, left anterior fascicular block, PR elevation in aVR, PR depressions inferior limb leads.  Echocardiogram: 01/20/2023:  1. Left ventricular ejection fraction, by estimation, is 60 to 65%. The  left ventricle has normal function. The left ventricle has no regional  wall motion abnormalities. Left ventricular diastolic parameters are  consistent with Grade I diastolic  dysfunction (impaired relaxation).   2. Right ventricular systolic function is normal. The right ventricular  size is normal. Tricuspid regurgitation signal is inadequate for assessing  PA pressure.   3. Left atrial size was mildly dilated.   4. Small to moderate circumferential pericardial effusion without  evidence of tamponade physiology. The IVC is dilated however no sigificant  respiratory variation or diastolic chamber collapse.  Conider repeating  echo 48 hours to reassess for any  progression of effusion. . small to moderate. The pericardial effusion is  circumferential. There is no evidence of cardiac tamponade.   5. The mitral valve is normal in structure. No evidence of mitral valve  regurgitation. No evidence of mitral stenosis.   6. The aortic valve is tricuspid. Aortic valve regurgitation is not  visualized. No aortic stenosis is present.   7. The inferior vena cava is dilated in size with <50% respiratory  variability, suggesting right atrial pressure of 15 mmHg.    Scheduled Meds:  Chlorhexidine Gluconate Cloth  6 each Topical Q0600   [START ON 01/21/2023] colchicine  0.6 mg Oral Daily   ferrous sulfate  325 mg Oral Q breakfast  indomethacin  25 mg Oral TID WC   magnesium oxide  400 mg Oral BID   pantoprazole  40 mg Oral BID   potassium chloride  10 mEq Oral BID    Continuous Infusions:   PRN Meds: acetaminophen **OR** acetaminophen, albuterol, ondansetron **OR** ondansetron (ZOFRAN) IV, oxyCODONE-acetaminophen  IMPRESSION & RECOMMENDATIONS: Stacy Burns is a 77 y.o. Caucasian female whose past medical history and cardiovascular risk factors include: Hx of DVT 2021, Colon cancer with metastasis dx 2021 on treatment and no prior surgery, anemia, former smoker.  Impression:  New pericardial effusion. Precordial discomfort -secondary to acute pericarditis Shortness of breath-multifactorial Anemia Aortic atherosclerosis on nongated CT study Colon cancer with metastasis History of DVT Former smoker  Plan:  Pericardial effusion: Newly detected ESR and CRP pending Small to moderate based on echo 01/20/2023 without tamponade physiology.  Images independently reviewed.  Agree IVC is plethoric.  However, no obvious RA or RV collapse and no significant mitral valve inflow variation to suggest tamponade physiology. Will discuss with oncology if pericardiocentesis is warranted for diagnostic purposes.   If not we will repeat a limited echocardiogram in 48 hours as recommended.  Precordial discomfort. Noncardiac based on symptoms Symptoms likely suggestive of acute pericarditis in the setting of new onset of pericardial effusion, positional chest pain which was an first noticed approximately a week ago, PR elevation on avR and PR depression in limib leads.  No active chest pain. No fevers. Newly detected pericardial effusion. Since Lovenox is currently being held by oncology will start indomethacin cautiously given her anemia.  Day 1: Indomethacin 25mg  po tid. If able to tolerate would recommend:  Week 1: indomethacin 50 mg p.o. 3 times daily  Week 2&3: Indomethacin 25 mg p.o. 3 times daily  Will start PPI during this period and she will be asked to take indomethacin right after meals.  Monitor H&H.   Shortness of breath:  Chronic and stable. Patient states that she notices the symptoms resurface when her hemoglobin trends low. Not in overt heart failure. Start Lasix 10 mg IV push twice daily  Strict I's and O's Monitor blood pressures vital signs Monitor for now.  Anemia: Multifactorial (secondary to chemotherapy, chronic disease, anticoagulation) Hemoglobin of 5.9 earlier this morning was a lab error due to labs being drawn from Port-A-Cath Will defer anemia workup to primary team and oncology.  Aortic atherosclerosis: Noted on recent CT scan-I personally reviewed quite extensive. Will check fasting lipid profile  Colon cancer with metastasis: Oncology following, consultation note reviewed, appreciate the recommendations  History of DVT: At the time of her colon cancer diagnosis Has been on Lovenox at home.  Total encounter time 72 minutes. *Total Encounter Time as defined by the Centers for Medicare and Medicaid Services includes, in addition to the face-to-face time of a patient visit (documented in the note above) non-face-to-face time: obtaining and reviewing outside  history, ordering and reviewing medications, tests or procedures, care coordination (communications with other health care professionals or caregivers) and documentation in the medical record.  Patient's questions and concerns were addressed to her satisfaction. She voices understanding of the instructions provided during this encounter.   This note was created using a voice recognition software as a result there may be grammatical errors inadvertently enclosed that do not reflect the nature of this encounter. Every attempt is made to correct such errors.  Delilah Shan [facility]  Pager:  [phone] Office: [phone] 01/20/2023, 3:10 PM

## 2023-01-21 DIAGNOSIS — I3139 Other pericardial effusion (noninflammatory): Secondary | ICD-10-CM | POA: Diagnosis not present

## 2023-01-21 DIAGNOSIS — R072 Precordial pain: Secondary | ICD-10-CM | POA: Diagnosis not present

## 2023-01-21 LAB — LIPID PANEL
Cholesterol: 151 mg/dL (ref 0–200)
HDL: 34 mg/dL — ABNORMAL LOW (ref >40)
LDL Cholesterol: 97 mg/dL (ref 0–99)
Total CHOL/HDL Ratio: 4.4 RATIO
Triglycerides: 99 mg/dL (ref <150)
VLDL: 20 mg/dL (ref 0–40)

## 2023-01-21 LAB — RENAL FUNCTION PANEL
Albumin: 2.5 g/dL — ABNORMAL LOW (ref 3.5–5.0)
Anion gap: 10 (ref 5–15)
BUN: 21 mg/dL (ref 8–23)
CO2: 23 mmol/L (ref 22–32)
Calcium: 8.6 mg/dL — ABNORMAL LOW (ref 8.9–10.3)
Chloride: 104 mmol/L (ref 98–111)
Creatinine, Ser: 0.96 mg/dL (ref 0.44–1.00)
GFR, Estimated: >60 mL/min (ref >60)
Glucose, Bld: 105 mg/dL — ABNORMAL HIGH (ref 70–99)
Phosphorus: 3.9 mg/dL (ref 2.5–4.6)
Potassium: 4.4 mmol/L (ref 3.5–5.1)
Sodium: 137 mmol/L (ref 135–145)

## 2023-01-21 LAB — CBC WITH DIFFERENTIAL/PLATELET
Abs Immature Granulocytes: 0.01 10*3/uL (ref 0.00–0.07)
Basophils Absolute: 0 10*3/uL (ref 0.0–0.1)
Basophils Relative: 0 %
Eosinophils Absolute: 0.2 10*3/uL (ref 0.0–0.5)
Eosinophils Relative: 4 %
HCT: 25.6 % — ABNORMAL LOW (ref 36.0–46.0)
Hemoglobin: 8 g/dL — ABNORMAL LOW (ref 12.0–15.0)
Immature Granulocytes: 0 %
Lymphocytes Relative: 19 %
Lymphs Abs: 1 10*3/uL (ref 0.7–4.0)
MCH: 27.3 pg (ref 26.0–34.0)
MCHC: 31.3 g/dL (ref 30.0–36.0)
MCV: 87.4 fL (ref 80.0–100.0)
Monocytes Absolute: 0.9 10*3/uL (ref 0.1–1.0)
Monocytes Relative: 16 %
Neutro Abs: 3.1 10*3/uL (ref 1.7–7.7)
Neutrophils Relative %: 61 %
Platelets: 231 10*3/uL (ref 150–400)
RBC: 2.93 MIL/uL — ABNORMAL LOW (ref 3.87–5.11)
RDW: 14.1 % (ref 11.5–15.5)
WBC: 5.2 10*3/uL (ref 4.0–10.5)
nRBC: 0 % (ref 0.0–0.2)

## 2023-01-21 LAB — LDL CHOLESTEROL, DIRECT: Direct LDL: 96 mg/dL (ref 0–99)

## 2023-01-21 MED ORDER — SODIUM CHLORIDE 0.9% FLUSH
10.0000 mL | Freq: Two times a day (BID) | INTRAVENOUS | Status: DC
  Administered 2023-01-21: 10 mL
  Administered 2023-01-21: 20 mL
  Administered 2023-01-22: 10 mL

## 2023-01-21 MED ORDER — INDOMETHACIN 25 MG PO CAPS
50.0000 mg | ORAL_CAPSULE | Freq: Three times a day (TID) | ORAL | Status: DC
  Administered 2023-01-21 – 2023-01-22 (×3): 50 mg via ORAL
  Filled 2023-01-21 (×4): qty 2

## 2023-01-21 MED ORDER — ATORVASTATIN CALCIUM 10 MG PO TABS
20.0000 mg | ORAL_TABLET | Freq: Every day | ORAL | Status: DC
  Administered 2023-01-21: 20 mg via ORAL
  Filled 2023-01-21: qty 2

## 2023-01-21 MED ORDER — INDOMETHACIN 25 MG PO CAPS: 25.0000 mg | ORAL_CAPSULE | Freq: Three times a day (TID) | ORAL | Status: DC

## 2023-01-21 MED ORDER — SODIUM CHLORIDE 0.9% FLUSH: 10.0000 mL | INTRAVENOUS | Status: DC | PRN

## 2023-01-21 MED ORDER — PANTOPRAZOLE SODIUM 40 MG PO TBEC
40.0000 mg | DELAYED_RELEASE_TABLET | Freq: Two times a day (BID) | ORAL | Status: DC
  Administered 2023-01-21 – 2023-01-22 (×2): 40 mg via ORAL
  Filled 2023-01-21 (×2): qty 1

## 2023-01-21 NOTE — Progress Notes (Signed)
IP PROGRESS NOTE  Subjective:   Stacy Burns reports feeling better.  She no longer has orthopnea or chest pain.  She was able to ambulate in the hall.  Objective: Vital signs in last 24 hours: Blood pressure (!) 113/45, pulse 81, temperature 97.7 F (36.5 C), temperature source Oral, resp. rate 18, height 5\' 7"  (1.702 m), weight 141 lb 12.8 oz (64.3 kg), SpO2 96 %.  Intake/Output from previous day: No intake/output data recorded.  Physical Exam:  HEENT: No thrush or ulcers Lungs: Clear bilaterally Cardiac: Regular rate and rhythm Abdomen: No hepatosplenomegaly Extremities: No leg edema  Portacath/PICC-without erythema  Lab Results: Recent Labs    01/20/23 0400 01/21/23 0457  WBC 6.3 5.2  HGB 7.9* 8.0*  HCT 24.8* 25.6*  PLT 226 231    BMET Recent Labs    01/20/23 0300 01/21/23 0456  NA 140 137  K 3.4* 4.4  CL 113* 104  CO2 19* 23  GLUCOSE 84 105*  BUN 18 21  CREATININE 0.73 0.96  CALCIUM 6.9* 8.6*    Lab Results  Component Value Date   CEA1 54.57 (H) 12/30/2020   CEA 18.27 (H) 01/19/2023    Studies/Results: ECHOCARDIOGRAM COMPLETE  Result Date: 01/20/2023    ECHOCARDIOGRAM REPORT   Patient Name:   Stacy Burns Date of Exam: 01/20/2023 Medical Rec #:  [id number]    Height:       67.0 in Accession #:    [id number]   Weight:       144.4 lb Date of Birth:  07/15/1946     BSA:          1.761 m Patient Age:    77 years     BP:           134/67 mmHg Patient Gender: F            HR:           80 bpm. Exam Location:  Inpatient Procedure: 2D Echo, Cardiac Doppler and Color Doppler Indications:    Pericardial effusion I31.3  History:        Patient has prior history of Echocardiogram examinations, most                 recent 12/16/2019. Pericardial Disease.  Sonographer:    Lucendia Herrlich Referring Phys: [phone] JARED M GARDNER IMPRESSIONS  1. Left ventricular ejection fraction, by estimation, is 60 to 65%. The left ventricle has normal function. The left ventricle has no  regional wall motion abnormalities. Left ventricular diastolic parameters are consistent with Grade I diastolic dysfunction (impaired relaxation).  2. Right ventricular systolic function is normal. The right ventricular size is normal. Tricuspid regurgitation signal is inadequate for assessing PA pressure.  3. Left atrial size was mildly dilated.  4. Small to moderate circumferential pericardial effusion without evidence of tamponade physiology. The IVC is dilated however no sigificant respiratory variation or diastolic chamber collapse. Conider repeating echo 48 hours to reassess for any progression of effusion. . small to moderate. The pericardial effusion is circumferential. There is no evidence of cardiac tamponade.  5. The mitral valve is normal in structure. No evidence of mitral valve regurgitation. No evidence of mitral stenosis.  6. The aortic valve is tricuspid. Aortic valve regurgitation is not visualized. No aortic stenosis is present.  7. The inferior vena cava is dilated in size with <50% respiratory variability, suggesting right atrial pressure of 15 mmHg. FINDINGS  Left Ventricle: Left ventricular ejection fraction, by estimation, is  60 to 65%. The left ventricle has normal function. The left ventricle has no regional wall motion abnormalities. The left ventricular internal cavity size was normal in size. There is  no left ventricular hypertrophy. Left ventricular diastolic parameters are consistent with Grade I diastolic dysfunction (impaired relaxation). Normal left ventricular filling pressure. Right Ventricle: The right ventricular size is normal. Right vetricular wall thickness was not well visualized. Right ventricular systolic function is normal. Tricuspid regurgitation signal is inadequate for assessing PA pressure. Left Atrium: Left atrial size was mildly dilated. Right Atrium: Right atrial size was normal in size. Pericardium: Small to moderate circumferential pericardial effusion without  evidence of tamponade physiology. The IVC is dilated however no sigificant respiratory variation or diastolic chamber collapse. Conider repeating echo 48 hours to reassess for any progression of effusion. Small to moderate. The pericardial effusion is circumferential. There is no evidence of cardiac tamponade. Mitral Valve: The mitral valve is normal in structure. No evidence of mitral valve regurgitation. No evidence of mitral valve stenosis. Tricuspid Valve: The tricuspid valve is normal in structure. Tricuspid valve regurgitation is not demonstrated. No evidence of tricuspid stenosis. Aortic Valve: The aortic valve is tricuspid. Aortic valve regurgitation is not visualized. No aortic stenosis is present. Aortic valve mean gradient measures 4.0 mmHg. Aortic valve peak gradient measures 8.6 mmHg. Aortic valve area, by VTI measures 2.14 cm. Pulmonic Valve: The pulmonic valve was not well visualized. Pulmonic valve regurgitation is not visualized. No evidence of pulmonic stenosis. Aorta: The aortic root is normal in size and structure. Venous: The inferior vena cava is dilated in size with less than 50% respiratory variability, suggesting right atrial pressure of 15 mmHg. IAS/Shunts: No atrial level shunt detected by color flow Doppler.  LEFT VENTRICLE PLAX 2D LVIDd:         4.40 cm   Diastology LVIDs:         2.90 cm   LV e' medial:    7.62 cm/s LV PW:         0.80 cm   LV E/e' medial:  12.8 LV IVS:        0.90 cm   LV e' lateral:   7.07 cm/s LVOT diam:     2.00 cm   LV E/e' lateral: 13.8 LV SV:         59 LV SV Index:   34 LVOT Area:     3.14 cm  RIGHT VENTRICLE             IVC RV S prime:     13.10 cm/s  IVC diam: 2.60 cm TAPSE (M-mode): 1.7 cm LEFT ATRIUM             Index        RIGHT ATRIUM          Index LA diam:        2.70 cm 1.53 cm/m   RA Area:     9.33 cm LA Vol (A2C):   71.3 ml 40.49 ml/m  RA Volume:   17.40 ml 9.88 ml/m LA Vol (A4C):   69.1 ml 39.24 ml/m LA Biplane Vol: 73.1 ml 41.52 ml/m   AORTIC VALVE AV Area (Vmax):    2.36 cm AV Area (Vmean):   2.45 cm AV Area (VTI):     2.14 cm AV Vmax:           146.66 cm/s AV Vmean:          89.787 cm/s AV VTI:  0.278 m AV Peak Grad:      8.6 mmHg AV Mean Grad:      4.0 mmHg LVOT Vmax:         110.00 cm/s LVOT Vmean:        69.900 cm/s LVOT VTI:          0.189 m LVOT/AV VTI ratio: 0.68  AORTA Ao Root diam: 3.30 cm Ao Asc diam:  2.80 cm MITRAL VALVE MV Area (PHT): 3.42 cm     SHUNTS MV Decel Time: 222 msec     Systemic VTI:  0.19 m MV E velocity: 97.40 cm/s   Systemic Diam: 2.00 cm MV A velocity: 111.00 cm/s MV E/A ratio:  0.88 Dina Rich MD Electronically signed by Dina Rich MD Signature Date/Time: 01/20/2023/1:01:14 PM    Final    DG Chest 2 View  Result Date: 01/19/2023 CLINICAL DATA:  SOB, ?pericardial effusion EXAM: CHEST - 2 VIEW COMPARISON:  Same day chest CT. FINDINGS: Globular appearance of the heart. No consolidation. Trace left pleural effusion. Streaky overlying left basilar opacities. No visible pneumothorax. Right IJ approach central venous catheter with the tip projecting at the mid SVC. IMPRESSION: 1. No consolidation. 2. Globular appearance of the heart, compatible with known pericardial effusion. 3. Trace left pleural effusion with overlying atelectasis. Electronically Signed   By: Feliberto Harts M.D.   On: 01/19/2023 20:08   CT Angio Chest Pulmonary Embolism (PE) W or WO Contrast  Result Date: 01/19/2023 CLINICAL DATA:  History of metastatic colon cancer with cough, dyspnea, chest pain and fever. EXAM: CT ANGIOGRAPHY CHEST WITH CONTRAST TECHNIQUE: Multidetector CT imaging of the chest was performed using the standard protocol during bolus administration of intravenous contrast. Multiplanar CT image reconstructions and MIPs were obtained to evaluate the vascular anatomy. RADIATION DOSE REDUCTION: This exam was performed according to the departmental dose-optimization program which includes automated exposure  control, adjustment of the mA and/or kV according to patient size and/or use of iterative reconstruction technique. CONTRAST:  80mL OMNIPAQUE IOHEXOL 350 MG/ML SOLN COMPARISON:  11/03/2022 FINDINGS: Cardiovascular: New moderate size pericardial effusion. Thoracic aorta is normal in caliber without aneurysm or dissection. There is calcified plaque throughout the thoracic aorta. Pulmonary arterial system is well opacified without evidence of emboli. Remaining vascular structures are unremarkable. Mediastinum/Nodes: A few small subcentimeter shotty mediastinal lymph nodes. No significant mediastinal or hilar adenopathy. Remaining mediastinal structures are unchanged. Lungs/Pleura: Lungs are adequately inflated. Mild biapical pleural thickening unchanged. Several calcified granulomas are present bilaterally with the largest measuring 8 mm over the posterior left lower lobe. No acute airspace process. Airways are within normal. Mild scarring left base with small amount of left pleural fluid. Upper Abdomen: Nodular contour to the liver suggesting cirrhosis. Previously seen liver hypodensities not well demonstrated on today's exam. Calcified plaque over the abdominal aorta. Remaining visualized images over the upper abdomen are unchanged. Musculoskeletal: No focal abnormality. Review of the MIP images confirms the above findings. IMPRESSION: 1. No evidence of pulmonary embolism. 2. New moderate size pericardial effusion. 3. Mild scarring left base with small amount of left pleural fluid. 4. Evidence of prior granulomatous disease. 5. Nodular contour to the liver suggesting cirrhosis. Previously seen liver hypodensities not well demonstrated on today's exam. 6. Aortic atherosclerosis. Aortic Atherosclerosis (ICD10-I70.0). Electronically Signed   By: Elberta Fortis M.D.   On: 01/19/2023 14:21    Medications: I have reviewed the patient's current medications.  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  Cirrhosis.  Innumerable liver lesions. Colonoscopy 12/18/2019-ulcerated partially obstructing large mass in the mid ascending colon.  Biopsy invasive adenocarcinoma; preserved expression major MMR proteins Upper endoscopy 12/18/2019-normal esophagus, stomach, examined duodenum CEA 12/19/2019-8,923 Biopsy liver lesion 12/23/2019-adenocarcinoma Foundation 1-K-ras wild-type; tumor mutation burden greater than or equal to 10; microsatellite stable; BRAF V600E Cycle 1 FOLFOX 01/01/2020 Cycle 2 FOLFOX 01/15/2020  Cycle 3 FOLFOX 01/29/2020, Udenyca added Cycle 4 FOLFOX 02/12/2020, Udenyca held Cycle 5 FOLFOX 02/26/2020, Udenyca Cycle 6 FOLFOX 03/11/2020, Udenyca held CT abdomen/pelvis 03/16/2020-stable diffuse liver lesions, stable strictured appearing ascending colon, possible pericolonic implant, vague left lower lobe nodule Cycle 1 FOLFIRI/Avastin 04/06/2020 Cycle 2 FOLFIRI/Avastin 04/21/2020 Cycle 3 FOLFIRI/Avastin 05/06/2020 Cycle 4 FOLFIRI/Avastin 05/20/2020 Cycle 5 FOLFIRI/Avastin 06/03/2020 CTs 06/15/2020-mild to moderate improvement in hepatic metastasis.  No new or progressive disease.  Narrowing within the proximal ascending colon with upstream mild cecal and terminal ileum dilatation, similar. Cycle 6 FOLFIRI/Avastin 06/17/2020 Cycle 7 FOLFIRI/Avastin 07/01/2020 Cycle 8 FOLFIRI/Avastin 07/15/2020 Cycle 9 FOLFIRI/Avastin 07/29/2020-5-FU bolus eliminated, 5-FU infusion and irinotecan dose reduced Cycle 10 FOLFIRI/Avastin 08/12/2020 CTs 08/23/2020- mild residual wall thickening involving the cecum/ascending colon.  Numerous liver metastases improved. Cycle 11 FOLFIRI/Avastin 08/25/2020 Cycle 12 FOLFIRI/Avastin 09/16/2020 Cycle 13 FOLFIRI/Avastin 10/07/2020 Cycle 14 FOLFIRI/Avastin 10/28/2020 Cycle 15 FOLFIRI/Avastin 11/18/2020 CT abdomen/pelvis 12/06/2020-mild improvement in multifocal hepatic metastases,  wall thickening at the ascending colon with pericolonic inflammatory changes Cycle 16 FOLFIRI/Avastin 12/09/2020 Cycle 17 FOLFIRI/Avastin 12/30/2020 Cycle 18 FOLFIRI/Avastin 01/20/2021 Cycle 19 FOLFIRI/Avastin 02/10/2021 Cycle 20 FOLFIRI/Avastin 03/03/2021 Cycle 21 FOLFIRI/Avastin 03/24/2021 Cycle 22 FOLFIRI/Avastin 04/14/2021 CTs 05/04/2021-slight improvement in hepatic metastatic disease. Cycle 23 FOLFIRI/Avastin 05/05/2021 Cycle 24 FOLFIRI/Avastin 05/25/2021 Cycle 25 FOLFIRI/Avastin 06/15/2021 Cycle 26 FOLFIRI/Avastin 07/13/2021 Cycle 27 FOLFIRI/Avastin 08/02/2021 CTs 08/30/2021-majority of liver lesions are unchanged in size, 1 lesion adjacent to the gallbladder has increased, no adenopathy, persistent pericolonic stranding and peritoneal thickening at the cecum Progressive rise in CEA 08/31/2021-treatment changed to Xeloda/bevacizumab, began [place] 09/22/2021 CEA stable 11/03/2021 Xeloda/bevacizumab continued CTs 01/10/2022-increase in size of some of the liver lesions, others are stable, new tiny right lower lobe nodule, increased dilation of distal small bowel with increased wall thickening at the terminal ileum and ileocecal valve with 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, stable bilateral paracolic gutter peritoneal thickening Panitumumab/ Encorafenib 01/12/2022 (encorafenib 01/19/2022) Panitumumab 02/02/2022 Panitumumab 02/20/2022 Panitumumab 03/06/2022 Panitumumab 03/20/2022 Panitumumab 04/03/2022 CTs 04/14/2022-decrease size of liver metastases, new borderline subcarinal adenopathy, improved peritoneal thickening, cirrhosis or pseudocirrhosis, persistent dilated cecum Encorafenib and Panitumumab continued Panitumumab 04/18/2022 Panitumumab 05/05/2022 Panitumumab 05/19/2022 Panitumumab 06/02/2022 Panitumumab 06/16/2022, 4g IV mag and begin oral mag po once daily  Panitumumab 06/30/2022 Panitumumab 07/14/2022, 2 g IV magnesium, increase oral magnesium to 400 mg twice daily Panitumumab  07/28/2022, 2 g IV magnesium CTs 07/28/2022-no change in hepatic metastases, no new signs of metastatic disease, persistent mural thickening at the terminal ileum/cecum mild splenomegaly unchanged Panitumumab 08/18/2022 Panitumumab 09/01/2022 Panitumumab 09/15/2022 Panitumumab 09/29/2022 Panitumumab 10/13/2022 Panitumumab 10/27/2022 CTs 11/03/2022-unchanged hepatic metastases, no new lesions, 3.7 cm soft tissue density at the medial left kidney etiology unclear, improvement in wall thickening at the ileocecal junction development of appendiceal dilatation Panitumumab 11/10/2022 Panitumumab 11/24/2022 Panitumumab 12/08/2022 Panitumumab 12/22/2022 Panitumumab 01/05/2023 Anemia secondary to GI blood loss, on oral iron BID  Cirrhosis 12/26/2019-hospital admission for right lower extremity DVT and GI bleed, treated with heparin anticoagulation beginning 12/26/2019, converted to Lovenox at discharge 12/29/2019 Lovenox reduced to 40 mg daily 02/10/2021 secondary to bruising and nosebleeding  History of low-grade fever-likely "tumor" fever COVID-19 infection January 2021 Admission 01/19/2023 with dyspnea/orthopnea, pericardial effusion, EKG changes suggestive of pericarditis Echocardiogram 01/20/2023-LVEF 60-65%, small to moderate circumferential pericardial effusion without tamponade physiology Anemia secondary to chemotherapy, chronic disease, and phlebotomy   Stacy Burns has metastatic colon cancer.  Her clinical status has been stable and there has been no evidence of disease progression while on treatment with encorafenib and panitumumab.  She was admitted with dyspnea, orthopnea, and chest pain.  A CT of the chest was negative for pulmonary embolism and revealed a new pericardial effusion.  An echocardiogram reveals a small to moderate pericardial effusion.   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  The hemoglobin has not changed significantly over the  past several months.   Recommendations: 1.  Resume encorafenib at discharge 2.  Management of pericarditis per cardiology 3.  Repeat echocardiogram versus diagnostic pericardiocentesis 4.  Outpatient follow-up as scheduled with Cancer center     LOS: 0 days   Thornton Papas, MD   01/21/2023, 7:41 AM

## 2023-01-21 NOTE — Progress Notes (Signed)
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  Patient was admitted with shortness of breath and chest pain.  EKG revealed mild diffuse ST segment elevation.  Echocardiogram revealed small to moderate circumferential pericardial effusion.  CRP is elevated at 5.6.  Patient is currently being treated for presumed pericarditis.  Input from the oncology team is appreciated.  Malignant pericardial effusion remains a possibility.  Patient is currently on colchicine 0.6 Mg p.o. once daily.  Cardiology team is gradually introducing indomethacin (25 Mg p.o. 3 times daily), as Lovenox is currently on hold.  Echocardiogram revealed: 1. Left ventricular ejection fraction, by estimation, is 60 to 65%. The  left ventricle has normal function. The left ventricle has no regional  wall motion abnormalities. Left ventricular diastolic parameters are  consistent with Grade I diastolic  dysfunction (impaired relaxation).   2. Right ventricular systolic function is normal. The right ventricular  size is normal. Tricuspid regurgitation signal is inadequate for assessing  PA pressure.   3. Left atrial size was mildly dilated.   4. Small to moderate circumferential pericardial effusion without  evidence of tamponade physiology. The IVC is dilated however no sigificant  respiratory variation or diastolic chamber collapse. Conider repeating  echo 48 hours to reassess for any  progression of effusion. . small to moderate. The pericardial effusion is  circumferential. There is no evidence of cardiac tamponade.   5. The mitral valve is normal in structure. No evidence of mitral valve  regurgitation. No evidence of mitral stenosis.   6. The aortic  valve is tricuspid. Aortic valve regurgitation is not  visualized. No aortic stenosis is present.   7. The inferior vena cava is dilated in size with <50% respiratory  variability, suggesting right atrial pressure of 15 mmHg.    01/20/2023: Patient seen.  No chest pain.  No shortness of breath.  No constitutional symptoms endorsed.  01/21/2023: Patient seen.  No chest pain.  No shortness of breath.  Input from the cardiology and oncology team is highly appreciated.  The plan is to repeat echocardiogram tomorrow and if stable, patient will be discharged back on.  Patient will follow-up with oncology on discharge.  Patient will restart immunotherapy on discharge.   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 -Echocardiogram revealed small to moderate circumferential pericardial effusion with tamponade physiology. -CRP is elevated.  ESR is pending. -EKG revealed mild diffuse ST segment elevation. -Concerns for possible pericarditis.  Please see above documentation. -Patient is currently on colchicine. -Indomethacin is being gradually introduced (Lovenox is on hold. -Input from the cardiology team and oncology team is appreciated.  History of colon cancer. -Metastatic pericardial effusion remains an option. -Further workup and management will depend on above, including, but not limited to possible tapping of the pericardial fluid (we defer to oncology and cardiology teams). -Patient is currently  chest pain-free. -Query need to repeat echocardiogram in the next 3 to 5 days (as suggested in the echo report). 01/21/2023: See above  documentation.  Cancer of ascending colon [facility]): -Patient is on immunotherapy. -Oncology team is directing care.   01/21/2023: Patient may restart immunotherapy on discharge if no significant findings on echocardiogram.   Anemia Pt with chronic anemia, hemoccult positive, likely in setting of her known colon cancer not having achieved remission. -Hemoglobin of 5.9 g/dL earlier this morning, but improved to 7.9 g/dL. -Anemia is likely multifactorial. -Lovenox is on hold. -Continue to monitor closely.   DVT prophylaxis: Lovenox is on hold. Code Status: DO NOT RESUSCITATE. Family Communication:  Disposition Plan: This will depend on hospital course.   Consultants:  Cardiology. Oncology.  Procedures:  Echocardiogram.  Please see report above.  Antimicrobials:  None.   Subjective: No chest pain. No shortness of breath.  Objective: Vitals:   01/20/23 2014 01/20/23 2108 01/21/23 0315 01/21/23 1423  BP: (!) 113/45   (!) 140/69  Pulse: 77 81    Resp: 18  18 18   Temp: 98.5 F (36.9 C)  97.7 F (36.5 C) 98 F (36.7 C)  TempSrc: Oral  Oral Oral  SpO2: 94% 96%    Weight:   64.3 kg   Height:        Intake/Output Summary (Last 24 hours) at 01/21/2023 1858 Last data filed at 01/21/2023 0919 Gross per 24 hour  Intake 417 ml  Output --  Net 417 ml    Filed Weights   01/19/23 1738 01/19/23 2314 01/21/23 0315  Weight: 65.3 kg 65.5 kg 64.3 kg    Examination:  General exam: Appears calm and comfortable.  Patient is pale. Respiratory system: Clear to auscultation.  Cardiovascular system: S1 & S2 heard Gastrointestinal system: Abdomen is soft and nontender.  Central nervous system: Alert and oriented.  Patient moves all extremities.   Extremities: No leg edema.  Data Reviewed: I have personally reviewed following labs and imaging studies  CBC: Recent Labs  Lab 01/19/23 1145 01/19/23 1922 01/20/23 0300 01/20/23 0340 01/20/23 0400 01/21/23 0457  WBC 7.0 6.9 7.5 5.4  6.3 5.2  NEUTROABS 5.1 4.8  --   --   --  3.1  HGB 8.7* 8.4* 5.9* 6.7* 7.9* 8.0*  HCT 26.9* 27.2* 18.7* 21.6* 24.8* 25.6*  MCV 87.1 88.6 89.0 88.2 87.6 87.4  PLT 256 231 246 192 226 231    Basic Metabolic Panel: Recent Labs  Lab 01/19/23 1145 01/19/23 1922 01/20/23 0300 01/21/23 0456  NA 139 140 140 137  K 4.6 4.2 3.4* 4.4  CL 109 108 113* 104  CO2 22 21* 19* 23  GLUCOSE 89 91 84 105*  BUN 28* 22 18 21   CREATININE 0.76 0.80 0.73 0.96  CALCIUM 9.0 8.8* 6.9* 8.6*  MG 1.7  --   --   --   PHOS  --   --   --  3.9    GFR: Estimated Creatinine Clearance: 47.7 mL/min (by C-G formula based on SCr of 0.96 mg/dL). Liver Function Tests: Recent Labs  Lab 01/19/23 1145 01/19/23 1922 01/21/23 0456  AST 10* 12*  --   ALT 7 11  --   ALKPHOS 102 101  --   BILITOT 0.4 0.6  --   PROT 6.5 6.0*  --   ALBUMIN 3.4* 2.6* 2.5*    No results for input(s): "LIPASE", "AMYLASE" in the last 168 hours. No results for input(s): "AMMONIA" in the last 168  hours. Coagulation Profile: No results for input(s): "INR", "PROTIME" in the last 168 hours. Cardiac Enzymes: No results for input(s): "CKTOTAL", "CKMB", "CKMBINDEX", "TROPONINI" in the last 168 hours. BNP (last 3 results) No results for input(s): "PROBNP" in the last 8760 hours. HbA1C: No results for input(s): "HGBA1C" in the last 72 hours. CBG: No results for input(s): "GLUCAP" in the last 168 hours. Lipid Profile: Recent Labs    01/21/23 0456 01/21/23 0457  CHOL 151  --   HDL 34*  --   LDLCALC 97  --   TRIG 99  --   CHOLHDL 4.4  --   LDLDIRECT  --  96   Thyroid Function Tests: No results for input(s): "TSH", "T4TOTAL", "FREET4", "T3FREE", "THYROIDAB" in the last 72 hours. Anemia Panel: No results for input(s): "VITAMINB12", "FOLATE", "FERRITIN", "TIBC", "IRON", "RETICCTPCT" in the last 72 hours. Urine analysis:    Component Value Date/Time   COLORURINE YELLOW 12/22/2021 1027   APPEARANCEUR CLEAR 12/22/2021 1027   LABSPEC  1.029 12/22/2021 1027   PHURINE 6.0 12/22/2021 1027   GLUCOSEU NEGATIVE 12/22/2021 1027   HGBUR TRACE (A) 12/22/2021 1027   BILIRUBINUR NEGATIVE 12/22/2021 1027   KETONESUR NEGATIVE 12/22/2021 1027   PROTEINUR >300 (A) 01/10/2022 1020   NITRITE NEGATIVE 12/22/2021 1027   LEUKOCYTESUR NEGATIVE 12/22/2021 1027   Sepsis Labs: @LABRCNTIP (procalcitonin:4,lacticidven:4)  ) Recent Results (from the past 240 hour(s))  Resp panel by RT-PCR (RSV, Flu A&B, Covid) Anterior Nasal Swab     Status: None   Collection Time: 01/19/23  7:23 PM   Specimen: Anterior Nasal Swab  Result Value Ref Range Status   SARS Coronavirus 2 by RT PCR NEGATIVE NEGATIVE Final   Influenza A by PCR NEGATIVE NEGATIVE Final   Influenza B by PCR NEGATIVE NEGATIVE Final    Comment: (NOTE) The Xpert Xpress SARS-CoV-2/FLU/RSV plus assay is intended as an aid in the diagnosis of influenza from Nasopharyngeal swab specimens and should not be used as a sole basis for treatment. Nasal washings and aspirates are unacceptable for Xpert Xpress SARS-CoV-2/FLU/RSV testing.  Fact Sheet for Patients: BloggerCourse.com  Fact Sheet for Healthcare Providers: SeriousBroker.it  This test is not yet approved or cleared by the [place] FDA and has been authorized for detection and/or diagnosis of SARS-CoV-2 by FDA under an Emergency Use Authorization (EUA). This EUA will remain in effect (meaning this test can be used) for the duration of the COVID-19 declaration under Section 564(b)(1) of the Act, 21 U.S.C. section 360bbb-3(b)(1), unless the authorization is terminated or revoked.     Resp Syncytial Virus by PCR NEGATIVE NEGATIVE Final    Comment: (NOTE) Fact Sheet for Patients: BloggerCourse.com  Fact Sheet for Healthcare Providers: SeriousBroker.it  This test is not yet approved or cleared by the [place] FDA  and has been authorized for detection and/or diagnosis of SARS-CoV-2 by FDA under an Emergency Use Authorization (EUA). This EUA will remain in effect (meaning this test can be used) for the duration of the COVID-19 declaration under Section 564(b)(1) of the Act, 21 U.S.C. section 360bbb-3(b)(1), unless the authorization is terminated or revoked.  Performed at [facility], [address] [place], [place] [address]          Radiology Studies: ECHOCARDIOGRAM COMPLETE  Result Date: 01/20/2023    ECHOCARDIOGRAM REPORT   Patient Name:   Stacy Burns Date of Exam: 01/20/2023 Medical Rec #:  [id number]    Height:       67.0 in Accession #:  [id number]   Weight:       144.4 lb Date of Birth:  09-Apr-1946     BSA:          1.761 m Patient Age:    77 years     BP:           134/67 mmHg Patient Gender: F            HR:           80 bpm. Exam Location:  Inpatient Procedure: 2D Echo, Cardiac Doppler and Color Doppler Indications:    Pericardial effusion I31.3  History:        Patient has prior history of Echocardiogram examinations, most                 recent 12/16/2019. Pericardial Disease.  Sonographer:    Lucendia Herrlich Referring Phys: [phone] JARED M GARDNER IMPRESSIONS  1. Left ventricular ejection fraction, by estimation, is 60 to 65%. The left ventricle has normal function. The left ventricle has no regional wall motion abnormalities. Left ventricular diastolic parameters are consistent with Grade I diastolic dysfunction (impaired relaxation).  2. Right ventricular systolic function is normal. The right ventricular size is normal. Tricuspid regurgitation signal is inadequate for assessing PA pressure.  3. Left atrial size was mildly dilated.  4. Small to moderate circumferential pericardial effusion without evidence of tamponade physiology. The IVC is dilated however no sigificant respiratory variation or diastolic chamber collapse. Conider repeating echo 48 hours to reassess for any progression of  effusion. . small to moderate. The pericardial effusion is circumferential. There is no evidence of cardiac tamponade.  5. The mitral valve is normal in structure. No evidence of mitral valve regurgitation. No evidence of mitral stenosis.  6. The aortic valve is tricuspid. Aortic valve regurgitation is not visualized. No aortic stenosis is present.  7. The inferior vena cava is dilated in size with <50% respiratory variability, suggesting right atrial pressure of 15 mmHg. FINDINGS  Left Ventricle: Left ventricular ejection fraction, by estimation, is 60 to 65%. The left ventricle has normal function. The left ventricle has no regional wall motion abnormalities. The left ventricular internal cavity size was normal in size. There is  no left ventricular hypertrophy. Left ventricular diastolic parameters are consistent with Grade I diastolic dysfunction (impaired relaxation). Normal left ventricular filling pressure. Right Ventricle: The right ventricular size is normal. Right vetricular wall thickness was not well visualized. Right ventricular systolic function is normal. Tricuspid regurgitation signal is inadequate for assessing PA pressure. Left Atrium: Left atrial size was mildly dilated. Right Atrium: Right atrial size was normal in size. Pericardium: Small to moderate circumferential pericardial effusion without evidence of tamponade physiology. The IVC is dilated however no sigificant respiratory variation or diastolic chamber collapse. Conider repeating echo 48 hours to reassess for any progression of effusion. Small to moderate. The pericardial effusion is circumferential. There is no evidence of cardiac tamponade. Mitral Valve: The mitral valve is normal in structure. No evidence of mitral valve regurgitation. No evidence of mitral valve stenosis. Tricuspid Valve: The tricuspid valve is normal in structure. Tricuspid valve regurgitation is not demonstrated. No evidence of tricuspid stenosis. Aortic Valve: The  aortic valve is tricuspid. Aortic valve regurgitation is not visualized. No aortic stenosis is present. Aortic valve mean gradient measures 4.0 mmHg. Aortic valve peak gradient measures 8.6 mmHg. Aortic valve area, by VTI measures 2.14 cm. Pulmonic Valve: The pulmonic valve was not well visualized. Pulmonic valve regurgitation is not visualized.  No evidence of pulmonic stenosis. Aorta: The aortic root is normal in size and structure. Venous: The inferior vena cava is dilated in size with less than 50% respiratory variability, suggesting right atrial pressure of 15 mmHg. IAS/Shunts: No atrial level shunt detected by color flow Doppler.  LEFT VENTRICLE PLAX 2D LVIDd:         4.40 cm   Diastology LVIDs:         2.90 cm   LV e' medial:    7.62 cm/s LV PW:         0.80 cm   LV E/e' medial:  12.8 LV IVS:        0.90 cm   LV e' lateral:   7.07 cm/s LVOT diam:     2.00 cm   LV E/e' lateral: 13.8 LV SV:         59 LV SV Index:   34 LVOT Area:     3.14 cm  RIGHT VENTRICLE             IVC RV S prime:     13.10 cm/s  IVC diam: 2.60 cm TAPSE (M-mode): 1.7 cm LEFT ATRIUM             Index        RIGHT ATRIUM          Index LA diam:        2.70 cm 1.53 cm/m   RA Area:     9.33 cm LA Vol (A2C):   71.3 ml 40.49 ml/m  RA Volume:   17.40 ml 9.88 ml/m LA Vol (A4C):   69.1 ml 39.24 ml/m LA Biplane Vol: 73.1 ml 41.52 ml/m  AORTIC VALVE AV Area (Vmax):    2.36 cm AV Area (Vmean):   2.45 cm AV Area (VTI):     2.14 cm AV Vmax:           146.66 cm/s AV Vmean:          89.787 cm/s AV VTI:            0.278 m AV Peak Grad:      8.6 mmHg AV Mean Grad:      4.0 mmHg LVOT Vmax:         110.00 cm/s LVOT Vmean:        69.900 cm/s LVOT VTI:          0.189 m LVOT/AV VTI ratio: 0.68  AORTA Ao Root diam: 3.30 cm Ao Asc diam:  2.80 cm MITRAL VALVE MV Area (PHT): 3.42 cm     SHUNTS MV Decel Time: 222 msec     Systemic VTI:  0.19 m MV E velocity: 97.40 cm/s   Systemic Diam: 2.00 cm MV A velocity: 111.00 cm/s MV E/A ratio:  0.88 Dina Rich  MD Electronically signed by Dina Rich MD Signature Date/Time: 01/20/2023/1:01:14 PM    Final    DG Chest 2 View  Result Date: 01/19/2023 CLINICAL DATA:  SOB, ?pericardial effusion EXAM: CHEST - 2 VIEW COMPARISON:  Same day chest CT. FINDINGS: Globular appearance of the heart. No consolidation. Trace left pleural effusion. Streaky overlying left basilar opacities. No visible pneumothorax. Right IJ approach central venous catheter with the tip projecting at the mid SVC. IMPRESSION: 1. No consolidation. 2. Globular appearance of the heart, compatible with known pericardial effusion. 3. Trace left pleural effusion with overlying atelectasis. Electronically Signed   By: Feliberto Harts M.D.   On: 01/19/2023 20:08  Scheduled Meds:  atorvastatin  20 mg Oral QHS   Chlorhexidine Gluconate Cloth  6 each Topical Q0600   colchicine  0.6 mg Oral Daily   ferrous sulfate  325 mg Oral Q breakfast   indomethacin  50 mg Oral TID WC   Followed by   Melene Muller ON 01/29/2023] indomethacin  25 mg Oral TID WC   magnesium oxide  400 mg Oral BID   pantoprazole  40 mg Oral BID   potassium chloride  10 mEq Oral BID   sodium chloride flush  10-40 mL Intracatheter Q12H   Continuous Infusions:   LOS: 0 days    Time spent: 35 minutes.    Berton Mount, MD  [facility] Pager #: [phone] 7PM-7AM contact night coverage as above

## 2023-01-21 NOTE — Progress Notes (Signed)
Progress Note  Patient Name: Stacy Burns MRN: [id number] DOB: Mar 18, 1946 Date of Encounter: 01/21/2023  Attending physician: Barnetta Chapel, MD Primary care provider: Lance Bosch, NP  Subjective: Stacy Burns is a 77 y.o. Caucasian female who was seen and examined at bedside  Denies chest pain. Shortness of breath is also improved. Able to lay flat. Case discussed and reviewed with her nurse.  Objective: Vital Signs in the last 24 hours: Temp:  [97.7 F (36.5 C)-98.5 F (36.9 C)] 97.7 F (36.5 C) (06/09 0315) Pulse Rate:  [77-86] 81 (06/08 2108) Resp:  [17-18] 18 (06/09 0315) BP: (113-132)/(45-62) 113/45 (06/08 2014) SpO2:  [94 %-96 %] 96 % (06/08 2108) Weight:  [64.3 kg] 64.3 kg (06/09 0315)  Intake/Output:  Intake/Output Summary (Last 24 hours) at 01/21/2023 1310 Last data filed at 01/21/2023 0919 Gross per 24 hour  Intake 417 ml  Output --  Net 417 ml    Net IO Since Admission: 117 mL [01/21/23 1310]  Weights:     01/21/2023    3:15 AM 01/19/2023   11:14 PM 01/19/2023    5:38 PM  Last 3 Weights  Weight (lbs) 141 lb 12.8 oz 144 lb 6.4 oz 144 lb  Weight (kg) 64.32 kg 65.499 kg 65.318 kg      Telemetry:  Overnight telemetry shows sinus, which I personally reviewed.   Physical examination: PHYSICAL EXAM: Vitals:   01/20/23 1643 01/20/23 2014 01/20/23 2108 01/21/23 0315  BP: 132/62 (!) 113/45    Pulse: 86 77 81   Resp: 17 18  18   Temp: 98.5 F (36.9 C) 98.5 F (36.9 C)  97.7 F (36.5 C)  TempSrc: Oral Oral  Oral  SpO2: 96% 94% 96%   Weight:    64.3 kg  Height:        Physical Exam  Constitutional: No distress. She appears chronically ill.  hemodynamically stable.   Neck: No JVD present.  Cardiovascular: Normal rate, regular rhythm, S1 normal, S2 normal, intact distal pulses and normal pulses. Exam reveals no gallop, no S3 and no S4.  No murmur heard. Pulses:      Carotid pulses are  on the right side with bruit. Pulmonary/Chest: Effort normal  and breath sounds normal. No stridor. She has no wheezes. She has no rales.  Port right anterior chest wall  Abdominal: Soft. Bowel sounds are normal. She exhibits no distension. There is no abdominal tenderness.  Musculoskeletal:        General: No edema.     Cervical back: Neck supple.  Neurological: She is alert and oriented to person, place, and time. She has intact cranial nerves (2-12).  Skin: Skin is warm and moist.    Lab Results: Chemistry Recent Labs  Lab 01/19/23 1145 01/19/23 1922 01/20/23 0300 01/21/23 0456  NA 139 140 140 137  K 4.6 4.2 3.4* 4.4  CL 109 108 113* 104  CO2 22 21* 19* 23  GLUCOSE 89 91 84 105*  BUN 28* 22 18 21   CREATININE 0.76 0.80 0.73 0.96  CALCIUM 9.0 8.8* 6.9* 8.6*  PROT 6.5 6.0*  --   --   ALBUMIN 3.4* 2.6*  --  2.5*  AST 10* 12*  --   --   ALT 7 11  --   --   ALKPHOS 102 101  --   --   BILITOT 0.4 0.6  --   --   GFRNONAA >60 >60 >60 >60  ANIONGAP 8 11 8  10  Hematology Recent Labs  Lab 01/20/23 0340 01/20/23 0400 01/21/23 0457  WBC 5.4 6.3 5.2  RBC 2.45* 2.83* 2.93*  HGB 6.7* 7.9* 8.0*  HCT 21.6* 24.8* 25.6*  MCV 88.2 87.6 87.4  MCH 27.3 27.9 27.3  MCHC 31.0 31.9 31.3  RDW 14.6 14.4 14.1  PLT 192 226 231   High Sensitivity Troponin:   Recent Labs  Lab 01/19/23 1922 01/19/23 2127  TROPONINIHS 4 4     Cardiac EnzymesNo results for input(s): "TROPONINI" in the last 168 hours. No results for input(s): "TROPIPOC" in the last 168 hours.  BNP Recent Labs  Lab 01/19/23 1923  BNP 114.9*   Lab Results  Component Value Date   ESRSEDRATE 75 (H) 01/20/2023   Lab Results  Component Value Date   CRP 5.6 (H) 01/20/2023   DDimer No results for input(s): "DDIMER" in the last 168 hours.  Hemoglobin A1c: No results found for: "HGBA1C", "MPG" TSH No results for input(s): "TSH" in the last 8760 hours. Lipid Panel  Lab Results  Component Value Date   CHOL 151 01/21/2023   HDL 34 (L) 01/21/2023   LDLCALC 97 01/21/2023    LDLDIRECT 96 01/21/2023   TRIG 99 01/21/2023   CHOLHDL 4.4 01/21/2023   Drugs of Abuse  No results found for: "LABOPIA", "COCAINSCRNUR", "LABBENZ", "AMPHETMU", "THCU", "LABBARB"    Imaging: ECHOCARDIOGRAM COMPLETE  Result Date: 01/20/2023    ECHOCARDIOGRAM REPORT   Patient Name:   Stacy Burns Date of Exam: 01/20/2023 Medical Rec #:  [id number]    Height:       67.0 in Accession #:    [id number]   Weight:       144.4 lb Date of Birth:  [birth year]     BSA:          1.761 m Patient Age:    77 years     BP:           134/67 mmHg Patient Gender: F            HR:           80 bpm. Exam Location:  Inpatient Procedure: 2D Echo, Cardiac Doppler and Color Doppler Indications:    Pericardial effusion I31.3  History:        Patient has prior history of Echocardiogram examinations, most                 recent 12/16/2019. Pericardial Disease.  Sonographer:    Lucendia Herrlich Referring Phys: [phone] JARED M GARDNER IMPRESSIONS  1. Left ventricular ejection fraction, by estimation, is 60 to 65%. The left ventricle has normal function. The left ventricle has no regional wall motion abnormalities. Left ventricular diastolic parameters are consistent with Grade I diastolic dysfunction (impaired relaxation).  2. Right ventricular systolic function is normal. The right ventricular size is normal. Tricuspid regurgitation signal is inadequate for assessing PA pressure.  3. Left atrial size was mildly dilated.  4. Small to moderate circumferential pericardial effusion without evidence of tamponade physiology. The IVC is dilated however no sigificant respiratory variation or diastolic chamber collapse. Conider repeating echo 48 hours to reassess for any progression of effusion. . small to moderate. The pericardial effusion is circumferential. There is no evidence of cardiac tamponade.  5. The mitral valve is normal in structure. No evidence of mitral valve regurgitation. No evidence of mitral stenosis.  6. The aortic valve is tricuspid.  Aortic valve regurgitation is not visualized. No aortic stenosis is present.  7. The  inferior vena cava is dilated in size with <50% respiratory variability, suggesting right atrial pressure of 15 mmHg. FINDINGS  Left Ventricle: Left ventricular ejection fraction, by estimation, is 60 to 65%. The left ventricle has normal function. The left ventricle has no regional wall motion abnormalities. The left ventricular internal cavity size was normal in size. There is  no left ventricular hypertrophy. Left ventricular diastolic parameters are consistent with Grade I diastolic dysfunction (impaired relaxation). Normal left ventricular filling pressure. Right Ventricle: The right ventricular size is normal. Right vetricular wall thickness was not well visualized. Right ventricular systolic function is normal. Tricuspid regurgitation signal is inadequate for assessing PA pressure. Left Atrium: Left atrial size was mildly dilated. Right Atrium: Right atrial size was normal in size. Pericardium: Small to moderate circumferential pericardial effusion without evidence of tamponade physiology. The IVC is dilated however no sigificant respiratory variation or diastolic chamber collapse. Conider repeating echo 48 hours to reassess for any progression of effusion. Small to moderate. The pericardial effusion is circumferential. There is no evidence of cardiac tamponade. Mitral Valve: The mitral valve is normal in structure. No evidence of mitral valve regurgitation. No evidence of mitral valve stenosis. Tricuspid Valve: The tricuspid valve is normal in structure. Tricuspid valve regurgitation is not demonstrated. No evidence of tricuspid stenosis. Aortic Valve: The aortic valve is tricuspid. Aortic valve regurgitation is not visualized. No aortic stenosis is present. Aortic valve mean gradient measures 4.0 mmHg. Aortic valve peak gradient measures 8.6 mmHg. Aortic valve area, by VTI measures 2.14 cm. Pulmonic Valve: The pulmonic  valve was not well visualized. Pulmonic valve regurgitation is not visualized. No evidence of pulmonic stenosis. Aorta: The aortic root is normal in size and structure. Venous: The inferior vena cava is dilated in size with less than 50% respiratory variability, suggesting right atrial pressure of 15 mmHg. IAS/Shunts: No atrial level shunt detected by color flow Doppler.  LEFT VENTRICLE PLAX 2D LVIDd:         4.40 cm   Diastology LVIDs:         2.90 cm   LV e' medial:    7.62 cm/s LV PW:         0.80 cm   LV E/e' medial:  12.8 LV IVS:        0.90 cm   LV e' lateral:   7.07 cm/s LVOT diam:     2.00 cm   LV E/e' lateral: 13.8 LV SV:         59 LV SV Index:   34 LVOT Area:     3.14 cm  RIGHT VENTRICLE             IVC RV S prime:     13.10 cm/s  IVC diam: 2.60 cm TAPSE (M-mode): 1.7 cm LEFT ATRIUM             Index        RIGHT ATRIUM          Index LA diam:        2.70 cm 1.53 cm/m   RA Area:     9.33 cm LA Vol (A2C):   71.3 ml 40.49 ml/m  RA Volume:   17.40 ml 9.88 ml/m LA Vol (A4C):   69.1 ml 39.24 ml/m LA Biplane Vol: 73.1 ml 41.52 ml/m  AORTIC VALVE AV Area (Vmax):    2.36 cm AV Area (Vmean):   2.45 cm AV Area (VTI):     2.14 cm AV Vmax:  146.66 cm/s AV Vmean:          89.787 cm/s AV VTI:            0.278 m AV Peak Grad:      8.6 mmHg AV Mean Grad:      4.0 mmHg LVOT Vmax:         110.00 cm/s LVOT Vmean:        69.900 cm/s LVOT VTI:          0.189 m LVOT/AV VTI ratio: 0.68  AORTA Ao Root diam: 3.30 cm Ao Asc diam:  2.80 cm MITRAL VALVE MV Area (PHT): 3.42 cm     SHUNTS MV Decel Time: 222 msec     Systemic VTI:  0.19 m MV E velocity: 97.40 cm/s   Systemic Diam: 2.00 cm MV A velocity: 111.00 cm/s MV E/A ratio:  0.88 Dina Rich MD Electronically signed by Dina Rich MD Signature Date/Time: 01/20/2023/1:01:14 PM    Final    DG Chest 2 View  Result Date: 01/19/2023 CLINICAL DATA:  SOB, ?pericardial effusion EXAM: CHEST - 2 VIEW COMPARISON:  Same day chest CT. FINDINGS: Globular appearance  of the heart. No consolidation. Trace left pleural effusion. Streaky overlying left basilar opacities. No visible pneumothorax. Right IJ approach central venous catheter with the tip projecting at the mid SVC. IMPRESSION: 1. No consolidation. 2. Globular appearance of the heart, compatible with known pericardial effusion. 3. Trace left pleural effusion with overlying atelectasis. Electronically Signed   By: Feliberto Harts M.D.   On: 01/19/2023 20:08   CT Angio Chest Pulmonary Embolism (PE) W or WO Contrast  Result Date: 01/19/2023 CLINICAL DATA:  History of metastatic colon cancer with cough, dyspnea, chest pain and fever. EXAM: CT ANGIOGRAPHY CHEST WITH CONTRAST TECHNIQUE: Multidetector CT imaging of the chest was performed using the standard protocol during bolus administration of intravenous contrast. Multiplanar CT image reconstructions and MIPs were obtained to evaluate the vascular anatomy. RADIATION DOSE REDUCTION: This exam was performed according to the departmental dose-optimization program which includes automated exposure control, adjustment of the mA and/or kV according to patient size and/or use of iterative reconstruction technique. CONTRAST:  80mL OMNIPAQUE IOHEXOL 350 MG/ML SOLN COMPARISON:  11/03/2022 FINDINGS: Cardiovascular: New moderate size pericardial effusion. Thoracic aorta is normal in caliber without aneurysm or dissection. There is calcified plaque throughout the thoracic aorta. Pulmonary arterial system is well opacified without evidence of emboli. Remaining vascular structures are unremarkable. Mediastinum/Nodes: A few small subcentimeter shotty mediastinal lymph nodes. No significant mediastinal or hilar adenopathy. Remaining mediastinal structures are unchanged. Lungs/Pleura: Lungs are adequately inflated. Mild biapical pleural thickening unchanged. Several calcified granulomas are present bilaterally with the largest measuring 8 mm over the posterior left lower lobe. No acute  airspace process. Airways are within normal. Mild scarring left base with small amount of left pleural fluid. Upper Abdomen: Nodular contour to the liver suggesting cirrhosis. Previously seen liver hypodensities not well demonstrated on today's exam. Calcified plaque over the abdominal aorta. Remaining visualized images over the upper abdomen are unchanged. Musculoskeletal: No focal abnormality. Review of the MIP images confirms the above findings. IMPRESSION: 1. No evidence of pulmonary embolism. 2. New moderate size pericardial effusion. 3. Mild scarring left base with small amount of left pleural fluid. 4. Evidence of prior granulomatous disease. 5. Nodular contour to the liver suggesting cirrhosis. Previously seen liver hypodensities not well demonstrated on today's exam. 6. Aortic atherosclerosis. Aortic Atherosclerosis (ICD10-I70.0). Electronically Signed   By: Elberta Fortis M.D.  On: 01/19/2023 14:21    CARDIAC DATABASE: EKG: January 19, 2023: Sinus rhythm, 86 bpm, right bundle branch block, left anterior fascicular block, PR elevation in aVR, PR depressions inferior limb leads.   Echocardiogram: 01/20/2023:  1. Left ventricular ejection fraction, by estimation, is 60 to 65%. The  left ventricle has normal function. The left ventricle has no regional  wall motion abnormalities. Left ventricular diastolic parameters are  consistent with Grade I diastolic  dysfunction (impaired relaxation).   2. Right ventricular systolic function is normal. The right ventricular  size is normal. Tricuspid regurgitation signal is inadequate for assessing  PA pressure.   3. Left atrial size was mildly dilated.   4. Small to moderate circumferential pericardial effusion without  evidence of tamponade physiology. The IVC is dilated however no sigificant  respiratory variation or diastolic chamber collapse. Conider repeating  echo 48 hours to reassess for any  progression of effusion. . small to moderate. The  pericardial effusion is  circumferential. There is no evidence of cardiac tamponade.   5. The mitral valve is normal in structure. No evidence of mitral valve  regurgitation. No evidence of mitral stenosis.   6. The aortic valve is tricuspid. Aortic valve regurgitation is not  visualized. No aortic stenosis is present.   7. The inferior vena cava is dilated in size with <50% respiratory  variability, suggesting right atrial pressure of 15 mmHg.   Scheduled Meds:  atorvastatin  20 mg Oral QHS   Chlorhexidine Gluconate Cloth  6 each Topical Q0600   colchicine  0.6 mg Oral Daily   ferrous sulfate  325 mg Oral Q breakfast   indomethacin  25 mg Oral TID WC   indomethacin  50 mg Oral TID WC   Followed by   Melene Muller ON 01/29/2023] indomethacin  25 mg Oral TID WC   magnesium oxide  400 mg Oral BID   pantoprazole  40 mg Oral BID   potassium chloride  10 mEq Oral BID   sodium chloride flush  10-40 mL Intracatheter Q12H    Continuous Infusions:   PRN Meds: acetaminophen **OR** acetaminophen, albuterol, ondansetron **OR** ondansetron (ZOFRAN) IV, oxyCODONE-acetaminophen, sodium chloride flush   IMPRESSION & RECOMMENDATIONS: TERSEA MOSHER is a 77 y.o. Caucasian female whose past medical history and cardiac risk factors include: Hx of DVT 2021, Colon cancer with metastasis dx 2021 on treatment and no prior surgery, anemia, former smoker.   Impression: Pericardial effusion, newly discovered Acute pericarditis Shortness of breath-multifactorial. Anemia. Aortic atherosclerosis on nongated CT study. Colon cancer with metastasis. History of DVT. Former smoker.  Recommendations: Pericardial effusion Newly discovered. Echo on 01/20/2023 negative for cardiac tamponade. Clinically she is stable-normotensive, no persistent tachycardia, no syncope Repeat a limited echocardiogram tomorrow 01/22/2023 to evaluate severity of pericardial effusion and hemodynamic significance. If no tamponade physiology  pericardiocentesis will not be needed.  Dr. Thornton Papas does not need pericardial fluid for diagnostic purposes and ongoing treatment. Post discharge will arrange a repeat limited echo in 2-3 weeks to reevaluate pericardial effusion and no hemodynamic significance.  Acute pericarditis:  in the setting of new onset of pericardial effusion, positional chest pain which was an first noticed approximately a week ago, PR elevation on avR and PR depression in limib leads.  No fevers. High sensitive troponin is negative x 2. As above January 20, 2023: ESR 75, CRP 5.6 Chest pain has improved with initiation of indomethacin. Week 1: Indomethacin 50 mg p.o. 3 times daily Week 2 and 3: Indomethacin  25 mg p.o. 3 times daily While she is on indomethacin will continue PPI prophylactically and hold Lovenox therapy (Dr. Thornton Papas agreeable). 3 months for colchicine  Shortness of breath: Improved. Multifactorial Currently at baseline. Give small dose of Lasix given her symptoms, estimated RAP based on last echo, mildly elevated BNP levels. Also secondary to underlying anemia.  Chronic anemia: Multifactorial-chemotherapy/chronic disease/on anticoagulation history of DVT Morning hemoglobin is stable. Fecal occult blood positive - likely secondary to colon cancer per oncology.   Colon cancer with metastasis: Oncology following Plan is to restart therapy as outpatient.  History of DVT: At the time of her colon cancer diagnoses. Has been on Lovenox at home. Shared decision was to hold off Lovenox until she is done with the course of indomethacin treatment to decrease the risk of bleeding. Patient is agreeable with the plan of care  Aortic atherosclerosis: Most recent lipid profile reviewed, direct LDL 96 mg/dL. Start Lipitor 20 mg p.o. nightly  Spoke to Dr. Truett Perna  Will keep n.p.o. after midnight. Risks, benefits, alternatives to pericardiocentesis discussed. Will await the results of the  limited echocardiogram tomorrow to decide if pericardiocentesis is needed for therapeutic reasons.  Otherwise close outpatient follow-up.  Patient is aware of symptoms to look out for given her pericardial effusion.  Patient's questions and concerns were addressed to her satisfaction. She voices understanding of the instructions provided during this encounter.   This note was created using a voice recognition software as a result there may be grammatical errors inadvertently enclosed that do not reflect the nature of this encounter. Every attempt is made to correct such errors.  Delilah Shan [facility]  Pager:  [phone] Office: [phone] 01/21/2023, 1:10 PM

## 2023-01-22 ENCOUNTER — Telehealth: Payer: Self-pay

## 2023-01-22 ENCOUNTER — Observation Stay (HOSPITAL_COMMUNITY): Payer: BC Managed Care – PPO

## 2023-01-22 ENCOUNTER — Encounter: Payer: Self-pay | Admitting: Oncology

## 2023-01-22 DIAGNOSIS — R072 Precordial pain: Secondary | ICD-10-CM | POA: Diagnosis not present

## 2023-01-22 DIAGNOSIS — R0602 Shortness of breath: Secondary | ICD-10-CM | POA: Diagnosis not present

## 2023-01-22 DIAGNOSIS — I3139 Other pericardial effusion (noninflammatory): Secondary | ICD-10-CM | POA: Diagnosis not present

## 2023-01-22 LAB — ECHOCARDIOGRAM LIMITED
Height: 67 in
Weight: 2262.4 oz

## 2023-01-22 LAB — CALCIUM, IONIZED: Calcium, Ionized, Serum: 4.9 mg/dL (ref 4.5–5.6)

## 2023-01-22 SURGERY — PERICARDIOCENTESIS
Anesthesia: LOCAL

## 2023-01-22 MED ORDER — COLCHICINE 0.6 MG PO TABS: 0.6000 mg | ORAL_TABLET | Freq: Every day | ORAL | 0 refills | Status: DC

## 2023-01-22 MED ORDER — PANTOPRAZOLE SODIUM 40 MG PO TBEC: 40.0000 mg | DELAYED_RELEASE_TABLET | Freq: Two times a day (BID) | ORAL | 0 refills | Status: DC

## 2023-01-22 MED ORDER — INDOMETHACIN 50 MG PO CAPS: 50.0000 mg | ORAL_CAPSULE | Freq: Three times a day (TID) | ORAL | 0 refills | Status: AC

## 2023-01-22 MED ORDER — ATORVASTATIN CALCIUM 20 MG PO TABS: 20.0000 mg | ORAL_TABLET | Freq: Every day | ORAL | 0 refills | Status: DC

## 2023-01-22 MED ORDER — FUROSEMIDE 20 MG PO TABS: 10.0000 mg | ORAL_TABLET | Freq: Every day | ORAL | 0 refills | Status: DC

## 2023-01-22 MED ORDER — HEPARIN SOD (PORK) LOCK FLUSH 100 UNIT/ML IV SOLN
500.0000 [IU] | INTRAVENOUS | Status: AC | PRN
  Administered 2023-01-22: 500 [IU]

## 2023-01-22 MED ORDER — VITAMIN B-12 1000 MCG PO TABS: 1000.0000 ug | ORAL_TABLET | Freq: Every day | ORAL | 0 refills | Status: AC

## 2023-01-22 MED ORDER — FUROSEMIDE 20 MG PO TABS
10.0000 mg | ORAL_TABLET | Freq: Every day | ORAL | Status: DC
  Administered 2023-01-22: 10 mg via ORAL
  Filled 2023-01-22: qty 1

## 2023-01-22 MED ORDER — INDOMETHACIN 25 MG PO CAPS: 25.0000 mg | ORAL_CAPSULE | Freq: Three times a day (TID) | ORAL | 0 refills | Status: AC

## 2023-01-22 NOTE — Progress Notes (Signed)
Progress Note  Patient Name: Stacy Burns MRN: [id number] DOB: 05/30/1946 Date of Encounter: 01/22/2023  Attending physician: Barnetta Chapel, MD Primary care provider: Lance Bosch, NP  Subjective: Stacy Burns is a 77 y.o. Caucasian female who was seen and examined at bedside  Denies chest pain. Shortness of breath and orthopnea significantly improved Case discussed and reviewed with her nurse.  Objective: Vital Signs in the last 24 hours: Temp:  [97.6 F (36.4 C)-98.1 F (36.7 C)] 98.1 F (36.7 C) (06/10 1155) Pulse Rate:  [76-80] 79 (06/10 0751) Resp:  [16-19] 16 (06/10 1155) BP: (137-159)/(64-73) 156/73 (06/10 1155) SpO2:  [97 %] 97 % (06/10 0751) Weight:  [64.1 kg] 64.1 kg (06/10 0433)  Intake/Output:  Intake/Output Summary (Last 24 hours) at 01/22/2023 1423 Last data filed at 01/22/2023 1000 Gross per 24 hour  Intake 13 ml  Output --  Net 13 ml    Net IO Since Admission: 130 mL [01/22/23 1423]  Weights:     01/22/2023    4:33 AM 01/21/2023    3:15 AM 01/19/2023   11:14 PM  Last 3 Weights  Weight (lbs) 141 lb 6.4 oz 141 lb 12.8 oz 144 lb 6.4 oz  Weight (kg) 64.139 kg 64.32 kg 65.499 kg      Telemetry:  Overnight telemetry shows sinus, which I personally reviewed.   Physical examination: PHYSICAL EXAM: Vitals:   01/22/23 0433 01/22/23 0707 01/22/23 0751 01/22/23 1155  BP: (!) 146/69 (!) 148/68 137/64 (!) 156/73  Pulse: 80  79   Resp: 19 19  16   Temp: 98 F (36.7 C) 97.6 F (36.4 C)  98.1 F (36.7 C)  TempSrc: Oral Oral  Oral  SpO2: 97%  97%   Weight: 64.1 kg     Height:        Physical Exam  Constitutional: No distress.  hemodynamically stable.   Neck: No JVD present.  Cardiovascular: Normal rate, regular rhythm, S1 normal, S2 normal, intact distal pulses and normal pulses. Exam reveals no gallop, no S3 and no S4.  No murmur heard. Pulses:      Carotid pulses are  on the right side with bruit. Pulmonary/Chest: Effort normal and breath  sounds normal. No stridor. She has no wheezes. She has no rales.  Port right anterior chest wall  Abdominal: Soft. Bowel sounds are normal. She exhibits no distension. There is no abdominal tenderness.  Musculoskeletal:        General: No edema.     Cervical back: Neck supple.  Neurological: She is alert and oriented to person, place, and time. She has intact cranial nerves (2-12).  Skin: Skin is warm and moist.    Lab Results: Chemistry Recent Labs  Lab 01/19/23 1145 01/19/23 1922 01/20/23 0300 01/21/23 0456  NA 139 140 140 137  K 4.6 4.2 3.4* 4.4  CL 109 108 113* 104  CO2 22 21* 19* 23  GLUCOSE 89 91 84 105*  BUN 28* 22 18 21   CREATININE 0.76 0.80 0.73 0.96  CALCIUM 9.0 8.8* 6.9* 8.6*  PROT 6.5 6.0*  --   --   ALBUMIN 3.4* 2.6*  --  2.5*  AST 10* 12*  --   --   ALT 7 11  --   --   ALKPHOS 102 101  --   --   BILITOT 0.4 0.6  --   --   GFRNONAA >60 >60 >60 >60  ANIONGAP 8 11 8 10     Hematology Recent Labs  Lab 01/20/23 0340 01/20/23 0400 01/21/23 0457  WBC 5.4 6.3 5.2  RBC 2.45* 2.83* 2.93*  HGB 6.7* 7.9* 8.0*  HCT 21.6* 24.8* 25.6*  MCV 88.2 87.6 87.4  MCH 27.3 27.9 27.3  MCHC 31.0 31.9 31.3  RDW 14.6 14.4 14.1  PLT 192 226 231   High Sensitivity Troponin:   Recent Labs  Lab 01/19/23 1922 01/19/23 2127  TROPONINIHS 4 4     Cardiac EnzymesNo results for input(s): "TROPONINI" in the last 168 hours. No results for input(s): "TROPIPOC" in the last 168 hours.  BNP Recent Labs  Lab 01/19/23 1923  BNP 114.9*   Lab Results  Component Value Date   ESRSEDRATE 75 (H) 01/20/2023   Lab Results  Component Value Date   CRP 5.6 (H) 01/20/2023   DDimer No results for input(s): "DDIMER" in the last 168 hours.  Hemoglobin A1c: No results found for: "HGBA1C", "MPG" TSH No results for input(s): "TSH" in the last 8760 hours. Lipid Panel  Lab Results  Component Value Date   CHOL 151 01/21/2023   HDL 34 (L) 01/21/2023   LDLCALC 97 01/21/2023   LDLDIRECT 96  01/21/2023   TRIG 99 01/21/2023   CHOLHDL 4.4 01/21/2023   Drugs of Abuse  No results found for: "LABOPIA", "COCAINSCRNUR", "LABBENZ", "AMPHETMU", "THCU", "LABBARB"    Imaging: ECHOCARDIOGRAM LIMITED  Result Date: 01/22/2023    ECHOCARDIOGRAM LIMITED REPORT   Patient Name:   Stacy Burns Date of Exam: 01/22/2023 Medical Rec #:  [id number]    Height:       67.0 in Accession #:    [id number]   Weight:       141.4 lb Date of Birth:  1946-08-14     BSA:          1.745 m Patient Age:    77 years     BP:           137/64 mmHg Patient Gender: F            HR:           74 bpm. Exam Location:  Inpatient Procedure: Color Doppler, Cardiac Doppler and Limited Echo Indications:    pericardial effusion  History:        Patient has prior history of Echocardiogram examinations, most                 recent 01/20/2023. Cancer, Signs/Symptoms:Shortness of Breath;                 Risk Factors:Former Smoker.  Sonographer:    [name] RDCS Referring Phys: [id number] Stacy Burns IMPRESSIONS  1. LIMITED ECHO (full report see study done on 01/20/2023).  2. Left ventricular ejection fraction, by estimation, is 60 to 65%. The left ventricle has normal function.  3. Small to moderate. The pericardial effusion is circumferential. There is no evidence of cardiac tamponade.  4. The mitral valve is degenerative. No evidence of mitral valve regurgitation. No evidence of mitral stenosis.  5. The aortic valve is grossly normal.  6. The inferior vena cava is dilated in size with >50% respiratory variability, suggesting right atrial pressure of 8 mmHg. Comparison(s): A prior study was performed on 01/20/2023. IVC is now compressible otherwise no significant change. Conclusion(s)/Recommendation(s): Repeat echo in 2-3 week as per clinical progression. Sooner if needed. FINDINGS  Left Ventricle: Left ventricular ejection fraction, by estimation, is 60 to 65%. The left ventricle has normal function. Pericardium: Small to moderate. The  pericardial effusion  is circumferential. There is no evidence of cardiac tamponade. Mitral Valve: The mitral valve is degenerative in appearance. No evidence of mitral valve stenosis. Aortic Valve: The aortic valve is grossly normal. Venous: The inferior vena cava is dilated in size with greater than 50% respiratory variability, suggesting right atrial pressure of 8 mmHg. Additional Comments: Spectral Doppler performed. Color Doppler performed.  IVC IVC diam: 2.30 cm Jenesis Martin DO Electronically signed by Tessa Lerner DO Signature Date/Time: 01/22/2023/9:57:59 AM    Final     CARDIAC DATABASE: EKG: January 19, 2023: Sinus rhythm, 86 bpm, right bundle branch block, left anterior fascicular block, PR elevation in aVR, PR depressions inferior limb leads.   Echocardiogram: 01/20/2023:  1. Left ventricular ejection fraction, by estimation, is 60 to 65%. The  left ventricle has normal function. The left ventricle has no regional  wall motion abnormalities. Left ventricular diastolic parameters are  consistent with Grade I diastolic  dysfunction (impaired relaxation).   2. Right ventricular systolic function is normal. The right ventricular  size is normal. Tricuspid regurgitation signal is inadequate for assessing  PA pressure.   3. Left atrial size was mildly dilated.   4. Small to moderate circumferential pericardial effusion without  evidence of tamponade physiology. The IVC is dilated however no sigificant  respiratory variation or diastolic chamber collapse. Conider repeating  echo 48 hours to reassess for any  progression of effusion. . small to moderate. The pericardial effusion is  circumferential. There is no evidence of cardiac tamponade.   5. The mitral valve is normal in structure. No evidence of mitral valve  regurgitation. No evidence of mitral stenosis.   6. The aortic valve is tricuspid. Aortic valve regurgitation is not  visualized. No aortic stenosis is present.   7. The inferior  vena cava is dilated in size with <50% respiratory  variability, suggesting right atrial pressure of 15 mmHg.   01/22/2023: Limited echocardiography. LVEF 60-65%. Small to moderate pericardial effusion-no evidence of cardiac tamponade. IVC dilated but compressible, estimated RAP 8 mmHg.  Scheduled Meds:  atorvastatin  20 mg Oral QHS   Chlorhexidine Gluconate Cloth  6 each Topical Q0600   colchicine  0.6 mg Oral Daily   ferrous sulfate  325 mg Oral Q breakfast   furosemide  10 mg Oral Daily   indomethacin  50 mg Oral TID WC   Followed by   Melene Muller ON 01/29/2023] indomethacin  25 mg Oral TID WC   magnesium oxide  400 mg Oral BID   pantoprazole  40 mg Oral BID   potassium chloride  10 mEq Oral BID   sodium chloride flush  10-40 mL Intracatheter Q12H    Continuous Infusions:   PRN Meds: acetaminophen **OR** acetaminophen, albuterol, ondansetron **OR** ondansetron (ZOFRAN) IV, oxyCODONE-acetaminophen, sodium chloride flush   IMPRESSION & RECOMMENDATIONS: Stacy Burns is a 77 y.o. Caucasian female whose past medical history and cardiac risk factors include: Hx of DVT 2021, Colon cancer with metastasis dx 2021 on treatment and no prior surgery, anemia, former smoker.   Impression: Pericardial effusion, newly discovered Acute pericarditis Shortness of breath-multifactorial. Anemia. Aortic atherosclerosis on nongated CT study. Colon cancer with metastasis. History of DVT. Former smoker.  Recommendations: Pericardial effusion Newly discovered. Echo on 01/20/2023 negative for cardiac tamponade. Echo today 01/22/2023 negative for cardiac tamponade, estimated RAP has improved. Clinically she is stable-normotensive, no persistent tachycardia, no syncope Post discharge will arrange a repeat limited echo in 2-3 weeks to reevaluate pericardial effusion and no hemodynamic significance.  Acute pericarditis: New onset of pericardial effusion, positional chest pain which was an first  noticed approximately a week ago, PR elevation on avR and PR depression in limib leads.  No fevers. High sensitive troponin is negative x 2. As above January 20, 2023: ESR 75, CRP 5.6 Chest pain has improved with initiation of indomethacin. Week 1: Indomethacin 50 mg p.o. 3 times daily Week 2 and 3: Indomethacin 25 mg p.o. 3 times daily While she is on indomethacin will continue PPI prophylactically and hold Lovenox therapy (Dr. Thornton Papas agreeable). 3 months for colchicine  Shortness of breath: Improved. Multifactorial Currently at baseline - no orthopnea / PND Give small dose of Lasix given her symptoms, estimated RAP based on last echo, mildly elevated BNP levels. Also secondary to underlying anemia. Continue lasix 10mg  po qday and labs in 2 week. Will have labs w/ oncology next week.   Chronic anemia: Multifactorial-chemotherapy/chronic disease/on anticoagulation history of DVT Morning hemoglobin is stable. Fecal occult blood positive - likely secondary to colon cancer per oncology.   Colon cancer with metastasis: Oncology following Plan is to restart therapy as outpatient.  History of DVT: At the time of her colon cancer diagnoses. Has been on Lovenox at home. Shared decision was to hold off Lovenox until she is done with the course of indomethacin treatment to decrease the risk of bleeding. Patient is agreeable with the plan of care  Aortic atherosclerosis: Most recent lipid profile reviewed, direct LDL 96 mg/dL. Continue Lipitor 20 mg p.o. nightly  Patient's questions and concerns were addressed to her satisfaction. She voices understanding of the instructions provided during this encounter.   This note was created using a voice recognition software as a result there may be grammatical errors inadvertently enclosed that do not reflect the nature of this encounter. Every attempt is made to correct such errors.  Total time spent: 27 minutes.  Delilah Shan  [facility]  Pager:  [phone] Office: [phone] 01/22/2023, 2:23 PM

## 2023-01-22 NOTE — Telephone Encounter (Signed)
TOC call : Called patient, NA, LMAM

## 2023-01-22 NOTE — Progress Notes (Signed)
  2D Echocardiogram has been performed.  [name] 01/22/2023, 9:04 AM

## 2023-01-22 NOTE — TOC Initial Note (Signed)
Transition of Care [facility]) - Initial/Assessment Note    Patient Details  Name: Stacy Burns MRN: [id number] Date of Birth: [birth year]  [facility]) CM/SW Contact:    Ronny Bacon, RN Phone Number: 01/22/2023, 12:30 PM  Clinical Narrative: Spoke with patient at bedside. Patient reports, lives in a house with her daughter and son. Confirms that she has RW and shower rails. PCP is Dr. Burnice Logan in [place] and uses [facility] on [address] for her pharmacy. Son will transport patient home.                  Expected Discharge Plan: Home/Self Care Barriers to Discharge: Continued Medical Work up   Patient Goals and CMS Choice Patient states their goals for this hospitalization and ongoing recovery are:: To go home          Expected Discharge Plan and Services       Living arrangements for the past 2 months: Single Family Home                                      Prior Living Arrangements/Services Living arrangements for the past 2 months: Single Family Home Lives with:: Adult Children Patient language and need for interpreter reviewed:: Yes Do you feel safe going back to the place where you live?: Yes      Need for Family Participation in Patient Care: No (Comment) Care giver support system in place?: Yes (comment) Current home services: DME (RW, Shower rails) Criminal Activity/Legal Involvement Pertinent to Current Situation/Hospitalization: No - Comment as needed  Activities of Daily Living Home Assistive Devices/Equipment: Environmental consultant (specify type), Cane (specify quad or straight) ADL Screening (condition at time of admission) Patient's cognitive ability adequate to safely complete daily activities?: Yes Is the patient deaf or have difficulty hearing?: No Does the patient have difficulty seeing, even when wearing glasses/contacts?: No Does the patient have difficulty concentrating, remembering, or making decisions?: No Patient able to express  need for assistance with ADLs?: Yes Does the patient have difficulty dressing or bathing?: No Independently performs ADLs?: Yes (appropriate for developmental age) Does the patient have difficulty walking or climbing stairs?: No Weakness of Legs: None Weakness of Arms/Hands: None  Permission Sought/Granted                  Emotional Assessment Appearance:: Appears stated age Attitude/Demeanor/Rapport: Engaged Affect (typically observed): Appropriate Orientation: : Oriented to Self, Oriented to Place, Oriented to  Time, Oriented to Situation Alcohol / Substance Use: Not Applicable Psych Involvement: No (comment)  Admission diagnosis:  Pericardial effusion [I31.39] Patient Active Problem List   Diagnosis Date Noted   Precordial pain 01/20/2023   Acute pericarditis 01/20/2023   Shortness of breath 01/20/2023   Atherosclerosis of aorta (HCC) 01/20/2023   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) 01/20/2023   History of DVT (deep vein thrombosis) 01/20/2023   Former smoker 01/20/2023   Pericardial effusion 01/19/2023   Port-A-Cath in place 02/12/2020   Goals of care, counseling/discussion 12/29/2019   Cancer of ascending colon (HCC) 12/29/2019   Acute DVT (deep venous thrombosis) (HCC) 12/26/2019   Colonic mass    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   Metastatic disease (HCC) 12/17/2019   GI bleed 12/17/2019   Anemia 12/17/2019   Symptomatic anemia 12/15/2019   Hypokalemia 12/15/2019   Positive fecal occult blood test 12/15/2019   Leukocytosis 12/15/2019   Bilateral  lower extremity edema 12/15/2019   Abnormal EKG 12/15/2019   Elevated blood pressure reading 12/15/2019   PCP:  Lance Bosch, NP Pharmacy:   [facility] [phone] Ginette Otto, [place] - [address] AT [facility] [address] [place] [address] Phone: [phone] Fax: [phone]  [facility] [place] [place] - [address] [facility] [address] [place] [address] Phone:  [phone] Fax: [phone]     Social Determinants of Health (SDOH) Social History: SDOH Screenings   Food Insecurity: No Food Insecurity (01/19/2023)  Housing: Patient Declined (01/19/2023)  Transportation Needs: No Transportation Needs (01/19/2023)  Utilities: Not At Risk (01/19/2023)  Tobacco Use: Medium Risk (01/19/2023)   SDOH Interventions:     Readmission Risk Interventions     No data to display

## 2023-01-22 NOTE — Discharge Summary (Signed)
Physician Discharge Summary  Patient ID: Stacy Burns MRN: [id number] DOB/AGE: 1946-02-11 77 y.o.  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 Patient was admitted with shortness of breath and chest pain. EKG revealed mild diffuse ST segment elevation. Echocardiogram revealed small to moderate circumferential pericardial effusion. CRP was elevated at 5.6, and ESR 75. Patient was treated for presumed pericarditis. Input from the oncology team is appreciated. Malignant pericardial effusion was considered as a possibility, however, repeat echocardiogram did not reveal worsening pericardial effusion.  Cardiology team plans to repeat echocardiogram in the next 2 to 3 weeks.  There will be need to continue monitoring for possible tamponade.  Patient was managed with colchicine and indomethacin.  Patient will continue colchicine for 3 months.  Patient will be discharged on indomethacin 50 Mg p.o. 3 times daily for the first week and then 25 Mg p.o. 3 times daily for week 2 and further.  Patient is stable.  Cardiology and oncology team have both cleared patient for discharge.  Patient will follow-up with primary care provider, oncology and cardiology team on discharge.  Patient's immunotherapy will be resumed on discharge.    Pericardial effusion: New onset mod pericardial effusion with DOE, asymptomatic at rest. -Echocardiogram revealed small  to moderate circumferential pericardial effusion with tamponade physiology. -CRP and ESR elevated. -EKG revealed mild diffuse ST segment elevation. -Concerns for possible pericarditis.    -Patient was managed with Colchicine and Indomethacin. -Lovenox will be held when patient is on indomethacin.   -Input from the cardiology team and oncology team is appreciated.  History of colon cancer. -Metastatic pericardial effusion remain an option. -Repeat limited echocardiogram did not reveal worsening pericardial effusion. -Patient is currently chest pain-free. -Repeat echocardiogram 2 to 3 weeks after discharge (as per cardiology team) -Continue colchicine for 3 months. -Continue indomethacin 50 Mg p.o. 3 times daily for week 1, then 25 Mg p.o. 3 times daily for weeks 4 weeks 2 and 3.  Hold Lovenox while on indomethacin.  Cancer of ascending colon [facility]): -Patient is on immunotherapy. -Oncology team is directing care.  Patient may restart immunotherapy on discharge.   Anemia Pt with chronic anemia, hemoccult positive, likely in setting of her known colon cancer not having achieved remission. -Anemia is likely multifactorial. -Lovenox is on hold. -Continue to monitor closely.  Consults: cardiology and Medical Oncology  Significant Diagnostic Studies:  CT angio chest revealed: 1. No evidence of pulmonary embolism. 2. New moderate size pericardial effusion. 3. Mild scarring left base with small amount of left pleural fluid. 4. Evidence of prior granulomatous disease. 5. Nodular contour to the liver suggesting cirrhosis. Previously seen liver hypodensities not well demonstrated on today's exam. 6. Aortic atherosclerosis.  Echocardiogram done on 01/20/2023 revealed: 1. Left ventricular ejection fraction, by estimation, is 60 to 65%. The  left ventricle has normal function. The left ventricle has no regional  wall motion abnormalities. Left ventricular diastolic parameters are  consistent  with Grade  I diastolic  dysfunction (impaired relaxation).   2. Right ventricular systolic function is normal. The right ventricular  size is normal. Tricuspid regurgitation signal is inadequate for assessing  PA pressure.   3. Left atrial size was mildly dilated.   4. Small to moderate circumferential pericardial effusion without  evidence of tamponade physiology. The IVC is dilated however no sigificant  respiratory variation or diastolic chamber collapse. Conider repeating  echo 48 hours to reassess for any  progression of effusion. . small to moderate. The pericardial effusion is  circumferential. There is no evidence of cardiac tamponade.   5. The mitral valve is normal in structure. No evidence of mitral valve  regurgitation. No evidence of mitral stenosis.   6. The aortic valve is tricuspid. Aortic valve regurgitation is not  visualized. No aortic stenosis is present.   7. The inferior vena cava is dilated in size with <50% respiratory  variability, suggesting right atrial pressure of 15 mmHg.   Limited echocardiogram done on 01/22/2023 revealed: 1. LIMITED ECHO (full report see study done on 01/20/2023).   2. Left ventricular ejection fraction, by estimation, is 60 to 65%. The  left ventricle has normal function.   3. Small to moderate. The pericardial effusion is circumferential. There  is no evidence of cardiac tamponade.   4. The mitral valve is degenerative. No evidence of mitral valve  regurgitation. No evidence of mitral stenosis.   5. The aortic valve is grossly normal.   6. The inferior vena cava is dilated in size with >50% respiratory  variability, suggesting right atrial pressure of 8 mmHg.   Sed Rate (ESR): 75 CRP: 5.6    Treatments: Patient was treated with colchicine and indomethacin. As per Cardiology recommendation: "Week 1: Indomethacin 50 mg p.o. 3 times daily Week 2 and 3: Indomethacin 25 mg p.o. 3 times daily While she is on indomethacin will  continue PPI prophylactically and hold Lovenox therapy (Dr. Thornton Papas agreeable). 3 months for colchicine"  "Post discharge will arrange a repeat limited echo in 2-3 weeks to reevaluate pericardial effusion and no hemodynamic significance".   Discharge Exam: Blood pressure (!) 156/73, pulse 79, temperature 98.1 F (36.7 C), temperature source Oral, resp. rate 16, height 5\' 7"  (1.702 m), weight 64.1 kg, SpO2 97 %.   Disposition: Discharge disposition: 01-Home or Self Care       Discharge Instructions     Diet - low sodium heart healthy   Complete by: As directed    Increase activity slowly   Complete by: As directed       Allergies as of 01/22/2023       Reactions   Milk-related Compounds Nausea Only, Other (See Comments)   Delayed reaction if too much consumed    Penicillins Anaphylaxis, Other (See Comments)   Both parents had anaphylactic reactions to this   Pineapple Swelling, Other (See Comments)   Tongue swells and causes acid bumps there, also   Chocolate Other (See Comments)   Nasal congestion   Hydrocodone Nausea And Vomiting   Other Itching, Rash, Other (See Comments)   "Most all antibiotics and OTC cold meds break me out" Allergic to ALL NUTS, also = Itching   Sulfa Antibiotics Rash, Other (See Comments)   Severe rash        Medication List     STOP taking these medications    doxycycline 100 MG tablet Commonly known as: VIBRA-TABS   enoxaparin 40 MG/0.4ML injection Commonly known as: LOVENOX  nystatin powder Commonly known as: MYCOSTATIN/NYSTOP       TAKE these medications    albuterol 108 (90 Base) MCG/ACT inhaler Commonly known as: VENTOLIN HFA INHALE 2 PUFFS INTO THE LUNGS FOUR TIMES DAILY AS NEEDED What changed: See the new instructions.   atorvastatin 20 MG tablet Commonly known as: LIPITOR Take 1 tablet (20 mg total) by mouth at bedtime.   Braftovi 75 MG capsule Generic drug: encorafenib TAKE 4 CAPSULES ONCE DAILY    colchicine 0.6 MG tablet Take 1 tablet (0.6 mg total) by mouth daily. Start taking on: January 23, 2023   cyanocobalamin 1000 MCG tablet Commonly known as: VITAMIN B12 Take 1 tablet (1,000 mcg total) by mouth daily. What changed: how much to take   ferrous sulfate 325 (65 FE) MG tablet Commonly known as: FeroSul TAKE 1 TABLET(325 MG) BY MOUTH DAILY WITH BREAKFAST   furosemide 20 MG tablet Commonly known as: LASIX Take 0.5 tablets (10 mg total) by mouth daily. Start taking on: January 23, 2023   indomethacin 50 MG capsule Commonly known as: INDOCIN Take 1 capsule (50 mg total) by mouth 3 (three) times daily with meals for 7 days.   indomethacin 25 MG capsule Commonly known as: INDOCIN Take 1 capsule (25 mg total) by mouth 3 (three) times daily with meals for 14 days. Start taking on: January 29, 2023   lidocaine-prilocaine cream Commonly known as: EMLA Apply 1 Application topically as needed.   magic mouthwash Soln Take 5-10 mLs by mouth 4 (four) times daily as needed for mouth pain. Swish and spit   magnesium oxide 400 (240 Mg) MG tablet Commonly known as: MAG-OX Take 1 tablet (400 mg total) by mouth 2 (two) times daily.   oxyCODONE-acetaminophen 5-325 MG tablet Commonly known as: PERCOCET/ROXICET Take 1 tablet by mouth every 8 (eight) hours as needed for severe pain.   pantoprazole 40 MG tablet Commonly known as: PROTONIX Take 1 tablet (40 mg total) by mouth 2 (two) times daily.   potassium chloride 10 MEQ tablet Commonly known as: KLOR-CON TAKE 1 TABLET(10 MEQ) BY MOUTH TWICE DAILY What changed: See the new instructions.   Vitamin D3 25 MCG (1000 UT) Caps Take 1,000 Units by mouth daily.        Follow-up Information     Tessa Lerner, DO Follow up on 02/05/2023.   Specialties: Cardiology, Vascular Surgery Why: 3:15pm Contact information: [address] [place] [address] [phone]                 Signed: Barnetta Chapel 01/22/2023, 2:50 PM

## 2023-01-23 NOTE — Telephone Encounter (Signed)
2nd attempt to contact patient for [facility] call.

## 2023-01-24 ENCOUNTER — Other Ambulatory Visit: Payer: Self-pay | Admitting: Internal Medicine

## 2023-01-26 NOTE — Telephone Encounter (Signed)
Location of hospitalization: [place] Reason for hospitalization: Pericardial effusion  Date of discharge: 01/22/2023 Date of first communication with patient: today Person contacting patient: Me Current symptoms: n/a Do you understand why you were in the Hospital: Yes Questions regarding discharge instructions: None Where were you discharged to: Home Medications reviewed: Yes Allergies reviewed: Yes Dietary changes reviewed: Yes. Discussed low fat and low salt diet.  Referals reviewed: NA Activities of Daily Living: Able to with mild limitations Any transportation issues/concerns: None Any patient concerns: None Confirmed importance & date/time of Follow up appt: Yes Confirmed with patient if condition begins to worsen call. Pt was given the office number and encouraged to call back with questions or concerns: Yes

## 2023-01-28 ENCOUNTER — Other Ambulatory Visit: Payer: Self-pay | Admitting: Oncology

## 2023-02-02 ENCOUNTER — Inpatient Hospital Stay: Payer: BC Managed Care – PPO

## 2023-02-02 ENCOUNTER — Other Ambulatory Visit: Payer: Self-pay | Admitting: *Deleted

## 2023-02-02 ENCOUNTER — Encounter: Payer: Self-pay | Admitting: *Deleted

## 2023-02-02 ENCOUNTER — Other Ambulatory Visit: Payer: Self-pay

## 2023-02-02 ENCOUNTER — Inpatient Hospital Stay: Payer: BC Managed Care – PPO | Admitting: Oncology

## 2023-02-02 ENCOUNTER — Encounter: Payer: Self-pay | Admitting: Oncology

## 2023-02-02 VITALS — BP 154/64 | HR 84

## 2023-02-02 VITALS — BP 132/58 | HR 96 | Temp 98.2°F | Resp 18 | Ht 67.0 in | Wt 147.0 lb

## 2023-02-02 DIAGNOSIS — C787 Secondary malignant neoplasm of liver and intrahepatic bile duct: Secondary | ICD-10-CM | POA: Diagnosis not present

## 2023-02-02 DIAGNOSIS — C182 Malignant neoplasm of ascending colon: Secondary | ICD-10-CM

## 2023-02-02 DIAGNOSIS — D500 Iron deficiency anemia secondary to blood loss (chronic): Secondary | ICD-10-CM | POA: Diagnosis not present

## 2023-02-02 DIAGNOSIS — Z452 Encounter for adjustment and management of vascular access device: Secondary | ICD-10-CM | POA: Diagnosis not present

## 2023-02-02 DIAGNOSIS — Z5112 Encounter for antineoplastic immunotherapy: Secondary | ICD-10-CM | POA: Diagnosis present

## 2023-02-02 DIAGNOSIS — Z86718 Personal history of other venous thrombosis and embolism: Secondary | ICD-10-CM | POA: Diagnosis not present

## 2023-02-02 DIAGNOSIS — D649 Anemia, unspecified: Secondary | ICD-10-CM

## 2023-02-02 DIAGNOSIS — K7460 Unspecified cirrhosis of liver: Secondary | ICD-10-CM | POA: Diagnosis not present

## 2023-02-02 DIAGNOSIS — I3139 Other pericardial effusion (noninflammatory): Secondary | ICD-10-CM | POA: Diagnosis not present

## 2023-02-02 DIAGNOSIS — D6481 Anemia due to antineoplastic chemotherapy: Secondary | ICD-10-CM | POA: Diagnosis not present

## 2023-02-02 LAB — CMP (CANCER CENTER ONLY)
ALT: <5 U/L (ref 0–44)
AST: 10 U/L — ABNORMAL LOW (ref 15–41)
Albumin: 3.1 g/dL — ABNORMAL LOW (ref 3.5–5.0)
Alkaline Phosphatase: 91 U/L (ref 38–126)
Anion gap: 6 (ref 5–15)
BUN: 32 mg/dL — ABNORMAL HIGH (ref 8–23)
CO2: 22 mmol/L (ref 22–32)
Calcium: 8.5 mg/dL — ABNORMAL LOW (ref 8.9–10.3)
Chloride: 112 mmol/L — ABNORMAL HIGH (ref 98–111)
Creatinine: 1.08 mg/dL — ABNORMAL HIGH (ref 0.44–1.00)
GFR, Estimated: 53 mL/min — ABNORMAL LOW (ref >60)
Glucose, Bld: 92 mg/dL (ref 70–99)
Potassium: 5.4 mmol/L — ABNORMAL HIGH (ref 3.5–5.1)
Sodium: 140 mmol/L (ref 135–145)
Total Bilirubin: 0.3 mg/dL (ref 0.3–1.2)
Total Protein: 6.1 g/dL — ABNORMAL LOW (ref 6.5–8.1)

## 2023-02-02 LAB — CBC WITH DIFFERENTIAL (CANCER CENTER ONLY)
Abs Immature Granulocytes: 0.07 10*3/uL (ref 0.00–0.07)
Basophils Absolute: 0 10*3/uL (ref 0.0–0.1)
Basophils Relative: 1 %
Eosinophils Absolute: 0.2 10*3/uL (ref 0.0–0.5)
Eosinophils Relative: 3 %
HCT: 22.3 % — ABNORMAL LOW (ref 36.0–46.0)
Hemoglobin: 6.9 g/dL — CL (ref 12.0–15.0)
Immature Granulocytes: 1 %
Lymphocytes Relative: 10 %
Lymphs Abs: 0.7 10*3/uL (ref 0.7–4.0)
MCH: 27 pg (ref 26.0–34.0)
MCHC: 30.9 g/dL (ref 30.0–36.0)
MCV: 87.1 fL (ref 80.0–100.0)
Monocytes Absolute: 1 10*3/uL (ref 0.1–1.0)
Monocytes Relative: 14 %
Neutro Abs: 5 10*3/uL (ref 1.7–7.7)
Neutrophils Relative %: 71 %
Platelet Count: 250 10*3/uL (ref 150–400)
RBC: 2.56 MIL/uL — ABNORMAL LOW (ref 3.87–5.11)
RDW: 15.3 % (ref 11.5–15.5)
WBC Count: 6.9 10*3/uL (ref 4.0–10.5)
nRBC: 0 % (ref 0.0–0.2)

## 2023-02-02 LAB — TYPE AND SCREEN: Antibody Screen: NEGATIVE

## 2023-02-02 LAB — MAGNESIUM: Magnesium: 1.8 mg/dL (ref 1.7–2.4)

## 2023-02-02 LAB — CEA (ACCESS): CEA (CHCC): 18.16 ng/mL — ABNORMAL HIGH (ref 0.00–5.00)

## 2023-02-02 LAB — BPAM RBC: Blood Product Expiration Date: 202407112359

## 2023-02-02 LAB — ABO/RH: ABO/RH(D): A POS

## 2023-02-02 MED ORDER — HEPARIN SOD (PORK) LOCK FLUSH 100 UNIT/ML IV SOLN
500.0000 [IU] | Freq: Once | INTRAVENOUS | Status: AC | PRN
  Administered 2023-02-02: 500 [IU]

## 2023-02-02 MED ORDER — OXYCODONE-ACETAMINOPHEN 5-325 MG PO TABS
1.0000 | ORAL_TABLET | Freq: Three times a day (TID) | ORAL | 0 refills | Status: DC | PRN
Start: 2023-02-02 — End: 2023-03-30

## 2023-02-02 MED ORDER — SODIUM CHLORIDE 0.9% FLUSH
10.0000 mL | INTRAVENOUS | Status: DC | PRN
  Administered 2023-02-02: 10 mL

## 2023-02-02 MED ORDER — SODIUM CHLORIDE 0.9 % IV SOLN
6.0000 mg/kg | Freq: Once | INTRAVENOUS | Status: AC
  Administered 2023-02-02: 400 mg via INTRAVENOUS
  Filled 2023-02-02: qty 20

## 2023-02-02 MED ORDER — SODIUM CHLORIDE 0.9 % IV SOLN: Freq: Once | INTRAVENOUS | Status: AC

## 2023-02-02 NOTE — Patient Instructions (Signed)
[facility]   Discharge Instructions: Thank you for choosing [facility] to provide your oncology and hematology care.   If you have a lab appointment with the Cancer Center, please go directly to the Cancer Center and check in at the registration area.   Wear comfortable clothing and clothing appropriate for easy access to any Portacath or PICC line.   We strive to give you quality time with your provider. You may need to reschedule your appointment if you arrive late (15 or more minutes).  Arriving late affects you and other patients whose appointments are after yours.  Also, if you miss three or more appointments without notifying the office, you may be dismissed from the clinic at the provider's discretion.      For prescription refill requests, have your pharmacy contact our office and allow 72 hours for refills to be completed.    Today you received the following chemotherapy and/or immunotherapy agents Vectibix      To help prevent nausea and vomiting after your treatment, we encourage you to take your nausea medication as directed.  BELOW ARE SYMPTOMS THAT SHOULD BE REPORTED IMMEDIATELY: *FEVER GREATER THAN 100.4 F (38 C) OR HIGHER *CHILLS OR SWEATING *NAUSEA AND VOMITING THAT IS NOT CONTROLLED WITH YOUR NAUSEA MEDICATION *UNUSUAL SHORTNESS OF BREATH *UNUSUAL BRUISING OR BLEEDING *URINARY PROBLEMS (pain or burning when urinating, or frequent urination) *BOWEL PROBLEMS (unusual diarrhea, constipation, pain near the anus) TENDERNESS IN MOUTH AND THROAT WITH OR WITHOUT PRESENCE OF ULCERS (sore throat, sores in mouth, or a toothache) UNUSUAL RASH, SWELLING OR PAIN  UNUSUAL VAGINAL DISCHARGE OR ITCHING   Items with * indicate a potential emergency and should be followed up as soon as possible or go to the Emergency Department if any problems should occur.  Please show the CHEMOTHERAPY ALERT CARD or IMMUNOTHERAPY ALERT CARD at  check-in to the Emergency Department and triage nurse.  Should you have questions after your visit or need to cancel or reschedule your appointment, please contact [facility]  Dept: [phone]  and follow the prompts.  Office hours are 8:00 a.m. to 4:30 p.m. Monday - Friday. Please note that voicemails left after 4:00 p.m. may not be returned until the following business day.  We are closed weekends and major holidays. You have access to a nurse at all times for urgent questions. Please call the main number to the clinic Dept: [phone] and follow the prompts.   For any non-urgent questions, you may also contact your provider using MyChart. We now offer e-Visits for anyone 42 and older to request care online for non-urgent symptoms. For details visit [email or web address].   Also download the MyChart app! Go to the app store, search "MyChart", open the app, select [facility], and log in with your MyChart username and password.  Panitumumab Injection What is this medication? PANITUMUMAB (pan i TOOM ue mab) treats colorectal cancer. It works by blocking a protein that causes cancer cells to grow and multiply. This helps to slow or stop the spread of cancer cells. It is a monoclonal antibody. This medicine may be used for other purposes; ask your health care provider or pharmacist if you have questions. COMMON BRAND NAME(S): Vectibix What should I tell my care team before I take this medication? They need to know if you have any of these conditions: Eye disease Low levels of magnesium in the blood Lung disease An unusual or allergic reaction  to panitumumab, other medications, foods, dyes, or preservatives Pregnant or trying to get pregnant Breast-feeding How should I use this medication? This medication is injected into a vein. It is given by your care team in a hospital or clinic setting. Talk to your care team about the use of this medication in  children. Special care may be needed. Overdosage: If you think you have taken too much of this medicine contact a poison control center or emergency room at once. NOTE: This medicine is only for you. Do not share this medicine with others. What if I miss a dose? Keep appointments for follow-up doses. It is important not to miss your dose. Call your care team if you are unable to keep an appointment. What may interact with this medication? Bevacizumab This list may not describe all possible interactions. Give your health care provider a list of all the medicines, herbs, non-prescription drugs, or dietary supplements you use. Also tell them if you smoke, drink alcohol, or use illegal drugs. Some items may interact with your medicine. What should I watch for while using this medication? Your condition will be monitored carefully while you are receiving this medication. This medication may make you feel generally unwell. This is not uncommon as chemotherapy can affect healthy cells as well as cancer cells. Report any side effects. Continue your course of treatment even though you feel ill unless your care team tells you to stop. This medication can make you more sensitive to the sun. Keep out of the sun while receiving this medication and for 2 months after stopping therapy. If you cannot avoid being in the sun, wear protective clothing and sunscreen. Do not use sun lamps, tanning beds, or tanning booths. Check with your care team if you have severe diarrhea, nausea, and vomiting or if you sweat a lot. The loss of too much body fluid may make it dangerous for you to take this medication. This medication may cause serious skin reactions. They can happen weeks to months after starting the medication. Contact your care team right away if you notice fevers or flu-like symptoms with a rash. The rash may be red or purple and then turn into blisters or peeling of the skin. You may also notice a red rash with  swelling of the face, lips, or lymph nodes in your neck or under your arms. Talk to your care team if you may be pregnant. Serious birth defects can occur if you take this medication during pregnancy and for 2 months after the last dose. Contraception is recommended while taking this medication and for 2 months after the last dose. Your care team can help you find the option that works for you. Do not breastfeed while taking this medication and for 2 months after the last dose. This medication may cause infertility. Talk to your care team if you are concerned about your fertility. What side effects may I notice from receiving this medication? Side effects that you should report to your care team as soon as possible: Allergic reactions--skin rash, itching, hives, swelling of the face, lips, tongue, or throat Dry cough, shortness of breath or trouble breathing Eye pain, redness, irritation, or discharge with blurry or decreased vision Infusion reactions--chest pain, shortness of breath or trouble breathing, feeling faint or lightheaded Low magnesium level--muscle pain or cramps, unusual weakness or fatigue, fast or irregular heartbeat, tremors Low potassium level--muscle pain or cramps, unusual weakness or fatigue, fast or irregular heartbeat, constipation Redness, blistering, peeling, or loosening of   the skin, including inside the mouth Skin reactions on sun-exposed areas Side effects that usually do not require medical attention (report to your care team if they continue or are bothersome): Change in nail shape, thickness, or color Diarrhea Dry skin Fatigue Nausea Vomiting This list may not describe all possible side effects. Call your doctor for medical advice about side effects. You may report side effects to FDA at 1-800-FDA-1088. Where should I keep my medication? This medication is given in a hospital or clinic. It will not be stored at home. NOTE: This sheet is a summary. It may not  cover all possible information. If you have questions about this medicine, talk to your doctor, pharmacist, or health care provider.  2024 Elsevier/Gold Standard (2021-12-14 00:00:00)

## 2023-02-02 NOTE — Progress Notes (Signed)
Stacy Burns OFFICE PROGRESS NOTE   Diagnosis: Colon cancer  INTERVAL HISTORY:   Stacy Burns was admitted on 01/19/2023 with dyspnea.  She was found to have a pericardial effusion.  She was evaluated by cardiology and diagnosed with pericarditis.  She is continuing treatment for pericarditis.  The dyspnea has improved.  She is scheduled for a follow-up echocardiogram next week. Stacy Burns continues encorafenib.  She had a 10-minute nosebleed.  No other bleeding.  The stools are chronically "black ".  Objective:  Vital signs in last 24 hours:  Blood pressure (!) 132/58, pulse 96, temperature 98.2 F (36.8 C), temperature source Oral, resp. rate 18, height 5\' 7"  (1.702 m), weight 147 lb (66.7 kg), SpO2 100 %.    HEENT: No thrush or ulcers, pustular lesion at the right nostril Resp: Lungs clear bilaterally Cardio: Regular rate and rhythm GI: No hepatosplenomegaly, nontender Vascular: No leg edema  Skin: Acne type rash over the face, dried blood/induration at the right fourth toenail with a small amount of serous discharge  Portacath/PICC-without erythema  Lab Results:  Lab Results  Component Value Date   WBC 6.9 02/02/2023   HGB 6.9 (LL) 02/02/2023   HCT 22.3 (L) 02/02/2023   MCV 87.1 02/02/2023   PLT 250 02/02/2023   NEUTROABS 5.0 02/02/2023    CMP  Lab Results  Component Value Date   NA 137 01/21/2023   K 4.4 01/21/2023   CL 104 01/21/2023   CO2 23 01/21/2023   GLUCOSE 105 (H) 01/21/2023   BUN 21 01/21/2023   CREATININE 0.96 01/21/2023   CALCIUM 8.6 (L) 01/21/2023   PROT 6.0 (L) 01/19/2023   ALBUMIN 2.5 (L) 01/21/2023   AST 12 (L) 01/19/2023   ALT 11 01/19/2023   ALKPHOS 101 01/19/2023   BILITOT 0.6 01/19/2023   GFRNONAA >60 01/21/2023   GFRAA >60 05/06/2020    Lab Results  Component Value Date   CEA1 54.57 (H) 12/30/2020   CEA 18.27 (H) 01/19/2023    Medications: I have reviewed the patient's current  medications.   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  Cirrhosis.  Innumerable liver lesions. Colonoscopy 12/18/2019-ulcerated partially obstructing large mass in the mid ascending colon.  Biopsy invasive adenocarcinoma; preserved expression major MMR proteins Upper endoscopy 12/18/2019-normal esophagus, stomach, examined duodenum CEA 12/19/2019-8,923 Biopsy liver lesion 12/23/2019-adenocarcinoma Foundation 1-K-ras wild-type; tumor mutation burden greater than or equal to 10; microsatellite stable; BRAF V600E Cycle 1 FOLFOX 01/01/2020 Cycle 2 FOLFOX 01/15/2020  Cycle 3 FOLFOX 01/29/2020, Udenyca added Cycle 4 FOLFOX 02/12/2020, Udenyca held Cycle 5 FOLFOX 02/26/2020, Udenyca Cycle 6 FOLFOX 03/11/2020, Udenyca held CT abdomen/pelvis 03/16/2020-stable diffuse liver lesions, stable strictured appearing ascending colon, possible pericolonic implant, vague left lower lobe nodule Cycle 1 FOLFIRI/Avastin 04/06/2020 Cycle 2 FOLFIRI/Avastin 04/21/2020 Cycle 3 FOLFIRI/Avastin 05/06/2020 Cycle 4 FOLFIRI/Avastin 05/20/2020 Cycle 5 FOLFIRI/Avastin 06/03/2020 CTs 06/15/2020-mild to moderate improvement in hepatic metastasis.  No new or progressive disease.  Narrowing within the proximal ascending colon with upstream mild cecal and terminal ileum dilatation, similar. Cycle 6 FOLFIRI/Avastin 06/17/2020 Cycle 7 FOLFIRI/Avastin 07/01/2020 Cycle 8 FOLFIRI/Avastin 07/15/2020 Cycle 9 FOLFIRI/Avastin 07/29/2020-5-FU bolus eliminated, 5-FU infusion and irinotecan dose reduced Cycle 10 FOLFIRI/Avastin 08/12/2020 CTs 08/23/2020- mild residual wall thickening involving the cecum/ascending colon.  Numerous liver metastases improved. Cycle 11 FOLFIRI/Avastin 08/25/2020 Cycle 12 FOLFIRI/Avastin 09/16/2020 Cycle 13 FOLFIRI/Avastin 10/07/2020 Cycle 14 FOLFIRI/Avastin 10/28/2020 Cycle 15 FOLFIRI/Avastin 11/18/2020 CT  abdomen/pelvis 12/06/2020-mild  improvement in multifocal hepatic metastases, wall thickening at the ascending colon with pericolonic inflammatory changes Cycle 16 FOLFIRI/Avastin 12/09/2020 Cycle 17 FOLFIRI/Avastin 12/30/2020 Cycle 18 FOLFIRI/Avastin 01/20/2021 Cycle 19 FOLFIRI/Avastin 02/10/2021 Cycle 20 FOLFIRI/Avastin 03/03/2021 Cycle 21 FOLFIRI/Avastin 03/24/2021 Cycle 22 FOLFIRI/Avastin 04/14/2021 CTs 05/04/2021-slight improvement in hepatic metastatic disease. Cycle 23 FOLFIRI/Avastin 05/05/2021 Cycle 24 FOLFIRI/Avastin 05/25/2021 Cycle 25 FOLFIRI/Avastin 06/15/2021 Cycle 26 FOLFIRI/Avastin 07/13/2021 Cycle 27 FOLFIRI/Avastin 08/02/2021 CTs 08/30/2021-majority of liver lesions are unchanged in size, 1 lesion adjacent to the gallbladder has increased, no adenopathy, persistent pericolonic stranding and peritoneal thickening at the cecum Progressive rise in CEA 08/31/2021-treatment changed to Xeloda/bevacizumab, began [place] 09/22/2021 CEA stable 11/03/2021 Xeloda/bevacizumab continued CTs 01/10/2022-increase in size of some of the liver lesions, others are stable, new tiny right lower lobe nodule, increased dilation of distal small bowel with increased wall thickening at the terminal ileum and ileocecal valve with 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, stable bilateral paracolic gutter peritoneal thickening Panitumumab/ Encorafenib 01/12/2022 (encorafenib 01/19/2022) Panitumumab 02/02/2022 Panitumumab 02/20/2022 Panitumumab 03/06/2022 Panitumumab 03/20/2022 Panitumumab 04/03/2022 CTs 04/14/2022-decrease size of liver metastases, new borderline subcarinal adenopathy, improved peritoneal thickening, cirrhosis or pseudocirrhosis, persistent dilated cecum Encorafenib and Panitumumab continued Panitumumab 04/18/2022 Panitumumab 05/05/2022 Panitumumab 05/19/2022 Panitumumab 06/02/2022 Panitumumab 06/16/2022, 4g IV mag and begin oral mag po once daily  Panitumumab 06/30/2022 Panitumumab 07/14/2022, 2 g IV  magnesium, increase oral magnesium to 400 mg twice daily Panitumumab 07/28/2022, 2 g IV magnesium CTs 07/28/2022-no change in hepatic metastases, no new signs of metastatic disease, persistent mural thickening at the terminal ileum/cecum mild splenomegaly unchanged Panitumumab 08/18/2022 Panitumumab 09/01/2022 Panitumumab 09/15/2022 Panitumumab 09/29/2022 Panitumumab 10/13/2022 Panitumumab 10/27/2022 CTs 11/03/2022-unchanged hepatic metastases, no new lesions, 3.7 cm soft tissue density at the medial left kidney etiology unclear, improvement in wall thickening at the ileocecal junction development of appendiceal dilatation Panitumumab 11/10/2022 Panitumumab 11/24/2022 Panitumumab 12/08/2022 Panitumumab 12/22/2022 Panitumumab 01/05/2023 Anemia secondary to GI blood loss, and chemotherapy, on oral iron BID  Red cell transfusion 02/02/2023 Cirrhosis 12/26/2019-hospital admission for right lower extremity DVT and GI bleed, treated with heparin anticoagulation beginning 12/26/2019, converted to Lovenox at discharge 12/29/2019 Lovenox reduced to 40 mg daily 02/10/2021 secondary to bruising and nosebleeding History of low-grade fever-likely "tumor" fever COVID-19 infection January 2021 Admission 01/19/2023 with dyspnea/orthopnea, pericardial effusion, EKG changes suggestive of pericarditis Echocardiogram 01/20/2023-LVEF 60-65%, small to moderate circumferential pericardial effusion without tamponade physiology Anemia secondary to chemotherapy, chronic disease, and phlebotomy     Disposition: Stacy Burns has metastatic colon cancer.  There is no clinical evidence of disease progression.  She will continue panitumumab and encorafenib. She was admitted 01/19/2023 with a pericardial effusion.  She was diagnosed with pericarditis.  She continues treatment for pericarditis.  She will follow-up with cardiology for repeat echocardiogram next week.  Stacy Burns has severe anemia, likely secondary to chronic GI blood loss,  chemotherapy, and chronic disease.  She agrees to a Red cell transfusion today.  She has paronychia at the right fourth toe.  She will keep the area clean and use Neosporin.  She will call for increased erythema at the right toe.  Stacy Burns will return for an office visit and panitumumab in 2 weeks.  She will undergo a restaging CT evaluation in 4 weeks.  Thornton Papas, MD  02/02/2023  9:00 AM

## 2023-02-02 NOTE — Progress Notes (Signed)
CRITICAL VALUE STICKER  CRITICAL VALUE: Hgb 6.9  RECEIVER (on-site recipient of call):Danicia Terhaar,RN  DATE & TIME NOTIFIED: 02/02/23 @ 0859  MESSENGER (representative from lab):Norva Karvonen  MD NOTIFIED: Dr. Truett Perna  TIME OF NOTIFICATION:0902  RESPONSE:  Order type and screen to transfuse

## 2023-02-02 NOTE — Progress Notes (Signed)
Patient seen by Dr. Truett Perna today  Vitals are within treatment parameters.  Labs reviewed by Dr. Truett Perna and are not all within treatment parameters. Hgb 6.9--OK to proceed. K+  5.4--she will hold her oral K+ at home  Per physician team, patient is ready for treatment and there are NO modifications to the treatment plan. Arranging for transfusion at [facility] tomorrow. Tubes/labels to treatment room.

## 2023-02-02 NOTE — Progress Notes (Signed)
Placed transfusion orders for tomorrow at [facility]. Stacy Burns made aware of transfusion at [facility] tomorrow at 0800 and to keep her blood bank bracelet on.

## 2023-02-03 ENCOUNTER — Other Ambulatory Visit: Payer: Self-pay

## 2023-02-03 ENCOUNTER — Inpatient Hospital Stay: Payer: BC Managed Care – PPO

## 2023-02-03 DIAGNOSIS — C787 Secondary malignant neoplasm of liver and intrahepatic bile duct: Secondary | ICD-10-CM | POA: Diagnosis not present

## 2023-02-03 DIAGNOSIS — Z452 Encounter for adjustment and management of vascular access device: Secondary | ICD-10-CM | POA: Diagnosis not present

## 2023-02-03 DIAGNOSIS — C182 Malignant neoplasm of ascending colon: Secondary | ICD-10-CM | POA: Diagnosis not present

## 2023-02-03 DIAGNOSIS — Z5112 Encounter for antineoplastic immunotherapy: Secondary | ICD-10-CM | POA: Diagnosis not present

## 2023-02-03 DIAGNOSIS — D6481 Anemia due to antineoplastic chemotherapy: Secondary | ICD-10-CM | POA: Diagnosis not present

## 2023-02-03 DIAGNOSIS — Z86718 Personal history of other venous thrombosis and embolism: Secondary | ICD-10-CM | POA: Diagnosis not present

## 2023-02-03 DIAGNOSIS — I3139 Other pericardial effusion (noninflammatory): Secondary | ICD-10-CM | POA: Diagnosis not present

## 2023-02-03 DIAGNOSIS — D649 Anemia, unspecified: Secondary | ICD-10-CM

## 2023-02-03 DIAGNOSIS — K7460 Unspecified cirrhosis of liver: Secondary | ICD-10-CM | POA: Diagnosis not present

## 2023-02-03 DIAGNOSIS — D500 Iron deficiency anemia secondary to blood loss (chronic): Secondary | ICD-10-CM | POA: Diagnosis not present

## 2023-02-03 LAB — TYPE AND SCREEN: ABO/RH(D): A POS

## 2023-02-03 LAB — BPAM RBC

## 2023-02-03 MED ORDER — SODIUM CHLORIDE 0.9% IV SOLUTION
250.0000 mL | Freq: Once | INTRAVENOUS | Status: AC
  Administered 2023-02-03: 250 mL via INTRAVENOUS

## 2023-02-03 MED ORDER — HEPARIN SOD (PORK) LOCK FLUSH 100 UNIT/ML IV SOLN
500.0000 [IU] | Freq: Every day | INTRAVENOUS | Status: AC | PRN
  Administered 2023-02-03: 500 [IU]

## 2023-02-03 MED ORDER — SODIUM CHLORIDE 0.9% FLUSH
10.0000 mL | INTRAVENOUS | Status: AC | PRN
  Administered 2023-02-03: 10 mL

## 2023-02-03 NOTE — Patient Instructions (Signed)

## 2023-02-04 ENCOUNTER — Other Ambulatory Visit: Payer: Self-pay | Admitting: Internal Medicine

## 2023-02-05 ENCOUNTER — Ambulatory Visit: Payer: BC Managed Care – PPO | Admitting: Cardiology

## 2023-02-05 ENCOUNTER — Encounter: Payer: Self-pay | Admitting: Cardiology

## 2023-02-05 ENCOUNTER — Other Ambulatory Visit: Payer: Self-pay | Admitting: Nurse Practitioner

## 2023-02-05 VITALS — BP 131/66 | HR 83 | Ht 67.0 in | Wt 142.0 lb

## 2023-02-05 DIAGNOSIS — I319 Disease of pericardium, unspecified: Secondary | ICD-10-CM

## 2023-02-05 DIAGNOSIS — Z87891 Personal history of nicotine dependence: Secondary | ICD-10-CM

## 2023-02-05 DIAGNOSIS — I3139 Other pericardial effusion (noninflammatory): Secondary | ICD-10-CM | POA: Diagnosis not present

## 2023-02-05 DIAGNOSIS — R0989 Other specified symptoms and signs involving the circulatory and respiratory systems: Secondary | ICD-10-CM | POA: Diagnosis not present

## 2023-02-05 DIAGNOSIS — C787 Secondary malignant neoplasm of liver and intrahepatic bile duct: Secondary | ICD-10-CM

## 2023-02-05 DIAGNOSIS — Z86718 Personal history of other venous thrombosis and embolism: Secondary | ICD-10-CM

## 2023-02-05 DIAGNOSIS — I700 Atherosclerosis of aorta: Secondary | ICD-10-CM | POA: Diagnosis not present

## 2023-02-05 LAB — BPAM RBC
Blood Product Expiration Date: 202407112359
Blood Product Expiration Date: 202407112359
ISSUE DATE / TIME: 202406220752
ISSUE DATE / TIME: 202406220752
Unit Type and Rh: 6200
Unit Type and Rh: 6200

## 2023-02-05 LAB — TYPE AND SCREEN
Unit division: 0
Unit division: 0

## 2023-02-05 NOTE — Progress Notes (Signed)
ID:  [name], [name] 1946/05/07, MRN [id number]  PCP:  Lance Bosch, NP  Cardiologist:  Tessa Lerner, DO, [facility] (established care 01/20/2023)   Chief Complaint  Patient presents with   Pericardial Effusion   Hospitalization Follow-up    HPI  Stacy Burns is a 77 y.o. Caucasian female whose past medical history and cardiovascular risk factors include: Pericardial effusion/acute pericarditis, aortic atherosclerosis (nongated CT study) Hx of DVT 2021, Colon cancer with metastasis dx 2021 on treatment and no prior surgery, anemia, former smoker.   Recently was seen in the hospital for pericardial effusion and symptoms consistent with acute pericarditis.  Patient was started on a combination of indomethacin and colchicine for 3 weeks and 3 months respectively.  She has completed 1 week of indomethacin 50 mg 3 times daily.  She is not sure when she completes the 25 mg dose regimen.  She has followed up with her oncologist Dr. Thornton Papas since discharge most recent labs independently reviewed.  She continues to be anemic and received 2 units of PRBCs last week.  Clinically she denies chest pain or shortness of breath.  Most recent labs also noted hyperkalemia.  Patient has follow-up labs scheduled later this week to have it rechecked.  CARDIAC DATABASE: EKG: February 05, 2023: Sinus rhythm, 80 bpm, right bundle branch block, left axis, left anterior fascicular block, biatrial enlargement, without underlying ischemia or injury pattern.  Echocardiogram: 01/20/2023:  1. Left ventricular ejection fraction, by estimation, is 60 to 65%. The  left ventricle has normal function. The left ventricle has no regional  wall motion abnormalities. Left ventricular diastolic parameters are  consistent with Grade I diastolic  dysfunction (impaired relaxation).   2. Right ventricular systolic function is normal. The right ventricular  size is normal. Tricuspid regurgitation signal is inadequate for  assessing  PA pressure.   3. Left atrial size was mildly dilated.   4. Small to moderate circumferential pericardial effusion without  evidence of tamponade physiology. The IVC is dilated however no sigificant  respiratory variation or diastolic chamber collapse. Conider repeating  echo 48 hours to reassess for any  progression of effusion. . small to moderate. The pericardial effusion is  circumferential. There is no evidence of cardiac tamponade.   5. The mitral valve is normal in structure. No evidence of mitral valve  regurgitation. No evidence of mitral stenosis.   6. The aortic valve is tricuspid. Aortic valve regurgitation is not  visualized. No aortic stenosis is present.   7. The inferior vena cava is dilated in size with <50% respiratory  variability, suggesting right atrial pressure of 15 mmHg.    01/22/2023: Limited echocardiography. LVEF 60-65%. Small to moderate pericardial effusion-no evidence of cardiac tamponade. IVC dilated but compressible, estimated RAP 8 mmHg.    ALLERGIES: Allergies  Allergen Reactions   Milk-Related Compounds Nausea Only and Other (See Comments)    Delayed reaction if too much consumed    Penicillins Anaphylaxis and Other (See Comments)    Both parents had anaphylactic reactions to this   Pineapple Swelling and Other (See Comments)    Tongue swells and causes acid bumps there, also   Chocolate Other (See Comments)    Nasal congestion   Hydrocodone Nausea And Vomiting   Other Itching, Rash and Other (See Comments)    "Most all antibiotics and OTC cold meds break me out" Allergic to ALL NUTS, also = Itching   Sulfa Antibiotics Rash and Other (See Comments)  Severe rash    MEDICATION LIST PRIOR TO VISIT: Current Meds  Medication Sig   albuterol (VENTOLIN HFA) 108 (90 Base) MCG/ACT inhaler INHALE 2 PUFFS INTO THE LUNGS FOUR TIMES DAILY AS NEEDED (Patient taking differently: Inhale 2 puffs into the lungs every 4 (four) hours as needed  for shortness of breath or wheezing. INHALE 2 PUFFS INTO THE LUNGS FOUR TIMES DAILY AS NEEDED)   atorvastatin (LIPITOR) 20 MG tablet Take 1 tablet (20 mg total) by mouth at bedtime.   Cholecalciferol (VITAMIN D3) 25 MCG (1000 UT) CAPS Take 1,000 Units by mouth daily.   colchicine 0.6 MG tablet Take 1 tablet (0.6 mg total) by mouth daily.   cyanocobalamin (VITAMIN B12) 1000 MCG tablet Take 1 tablet (1,000 mcg total) by mouth daily.   ferrous sulfate (FEROSUL) 325 (65 FE) MG tablet TAKE 1 TABLET(325 MG) BY MOUTH DAILY WITH BREAKFAST   furosemide (LASIX) 20 MG tablet Take 0.5 tablets (10 mg total) by mouth daily.   indomethacin (INDOCIN) 25 MG capsule Take 1 capsule (25 mg total) by mouth 3 (three) times daily with meals for 14 days.   lidocaine-prilocaine (EMLA) cream Apply 1 Application topically as needed.   magnesium oxide (MAG-OX) 400 (240 Mg) MG tablet Take 1 tablet (400 mg total) by mouth 2 (two) times daily.   oxyCODONE-acetaminophen (PERCOCET/ROXICET) 5-325 MG tablet Take 1 tablet by mouth every 8 (eight) hours as needed for severe pain.   pantoprazole (PROTONIX) 40 MG tablet Take 1 tablet (40 mg total) by mouth 2 (two) times daily.   [DISCONTINUED] encorafenib (BRAFTOVI) 75 MG capsule TAKE 4 CAPSULES ONCE DAILY     PAST MEDICAL HISTORY: Past Medical History:  Diagnosis Date   colon ca dx'd 12/2019   COVID-19 virus infection 08/2019   not hospitalized.     Severe anemia 12/17/2019    PAST SURGICAL HISTORY: Past Surgical History:  Procedure Laterality Date   BIOPSY  12/18/2019   Procedure: BIOPSY;  Surgeon: Tressia Danas, MD;  Location[facility] ENDOSCOPY;  Service: Gastroenterology;;   COLONOSCOPY WITH PROPOFOL N/A 12/18/2019   Procedure: COLONOSCOPY WITH PROPOFOL;  Surgeon: Tressia Danas, MD;  Location: [facility] ENDOSCOPY;  Service: Gastroenterology;  Laterality: N/A;   ESOPHAGOGASTRODUODENOSCOPY (EGD) WITH PROPOFOL N/A 12/18/2019   Procedure: ESOPHAGOGASTRODUODENOSCOPY (EGD) WITH  PROPOFOL;  Surgeon: Tressia Danas, MD;  Location: [facility] ENDOSCOPY;  Service: Gastroenterology;  Laterality: N/A;   PORTACATH PLACEMENT N/A 12/19/2019   Procedure: INSERTION PORT-A-CATH WITH ULTRASOUND;  Surgeon: Emelia Loron, MD;  Location[facility] OR;  Service: General;  Laterality: N/A;   SUBMUCOSAL TATTOO INJECTION  12/18/2019   Procedure: SUBMUCOSAL TATTOO INJECTION;  Surgeon: Tressia Danas, MD;  Location: [facility] ENDOSCOPY;  Service: Gastroenterology;;   TUBAL LIGATION      FAMILY HISTORY: The patient family history includes Diabetes Mellitus II in her mother; Leukemia in her father; Multiple myeloma in her mother; Muscular dystrophy in her brother.  SOCIAL HISTORY:  The patient  reports that she quit smoking about 38 years ago. Her smoking use included cigarettes. She has never used smokeless tobacco. She reports that she does not currently use alcohol. She reports that she does not use drugs.  REVIEW OF SYSTEMS: Review of Systems  Cardiovascular:  Negative for chest pain, claudication, dyspnea on exertion, irregular heartbeat, leg swelling, near-syncope, orthopnea, palpitations, paroxysmal nocturnal dyspnea and syncope.  Respiratory:  Negative for shortness of breath.   Hematologic/Lymphatic: Negative for bleeding problem.  Musculoskeletal:  Negative for muscle cramps and myalgias.  Neurological:  Negative for  dizziness and light-headedness.    PHYSICAL EXAM:    02/05/2023    3:33 PM 02/03/2023   11:49 AM 02/03/2023   10:19 AM  Vitals with BMI  Height 5\' 7"     Weight 142 lbs    BMI 22.24    Systolic 131 170 161  Diastolic 66 71 76  Pulse 83 78 76    Physical Exam  Constitutional: No distress.  Age appropriate, hemodynamically stable.   Neck: No JVD present.  Cardiovascular: Normal rate, regular rhythm, S1 normal, S2 normal, intact distal pulses and normal pulses. Exam reveals no gallop, no S3 and no S4.  No murmur heard. Pulses:      Carotid pulses are  on the right side  with bruit. Pulmonary/Chest: Effort normal and breath sounds normal. No stridor. She has no wheezes. She has no rales.  Port right anterior chest wall  Abdominal: Soft. Bowel sounds are normal. She exhibits no distension. There is no abdominal tenderness.  Musculoskeletal:        General: No edema.     Cervical back: Neck supple.  Neurological: She is alert and oriented to person, place, and time. She has intact cranial nerves (2-12).  Skin: Skin is warm and moist.   LABORATORY DATA:    Latest Ref Rng & Units 02/02/2023    7:50 AM 01/21/2023    4:57 AM 01/20/2023    4:00 AM  CBC  WBC 4.0 - 10.5 K/uL 6.9  5.2  6.3   Hemoglobin 12.0 - 15.0 g/dL 6.9  8.0  7.9   Hematocrit 36.0 - 46.0 % 22.3  25.6  24.8   Platelets 150 - 400 K/uL 250  231  226        Latest Ref Rng & Units 02/02/2023    7:50 AM 01/21/2023    4:56 AM 01/20/2023    3:00 AM  CMP  Glucose 70 - 99 mg/dL 92  096  84   BUN 8 - 23 mg/dL 32  21  18   Creatinine 0.44 - 1.00 mg/dL 0.45  4.09  8.11   Sodium 135 - 145 mmol/L 140  137  140   Potassium 3.5 - 5.1 mmol/L 5.4  4.4  3.4   Chloride 98 - 111 mmol/L 112  104  113   CO2 22 - 32 mmol/L 22  23  19    Calcium 8.9 - 10.3 mg/dL 8.5  8.6  6.9   Total Protein 6.5 - 8.1 g/dL 6.1     Total Bilirubin 0.3 - 1.2 mg/dL 0.3     Alkaline Phos 38 - 126 U/L 91     AST 15 - 41 U/L 10     ALT 0 - 44 U/L <5       Lab Results  Component Value Date   CHOL 151 01/21/2023   HDL 34 (L) 01/21/2023   LDLCALC 97 01/21/2023   LDLDIRECT 96 01/21/2023   TRIG 99 01/21/2023   CHOLHDL 4.4 01/21/2023   No components found for: "NTPROBNP" No results for input(s): "PROBNP" in the last 8760 hours. No results for input(s): "TSH" in the last 8760 hours.  BMP Recent Labs    01/20/23 0300 01/21/23 0456 02/02/23 0750  NA 140 137 140  K 3.4* 4.4 5.4*  CL 113* 104 112*  CO2 19* 23 22  GLUCOSE 84 105* 92  BUN 18 21 32*  CREATININE 0.73 0.96 1.08*  CALCIUM 6.9* 8.6* 8.5*  GFRNONAA >60 >60 53*  HEMOGLOBIN A1C No results found for: "HGBA1C", "MPG"  IMPRESSION:    ICD-10-CM   1. Pericardial effusion  I31.39 EKG 12-Lead    ECHOCARDIOGRAM LIMITED    2. Pericarditis, unspecified chronicity, unspecified type  I31.9 ECHOCARDIOGRAM LIMITED    3. Atherosclerosis of aorta (HCC)  I70.0     4. Bruit of right carotid artery  R09.89 PCV CAROTID DUPLEX (BILATERAL)    5. 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)  C18.9    C78.7     6. History of DVT (deep vein thrombosis)  Z86.718     7. Former smoker  Z87.891        RECOMMENDATIONS: Stacy Burns is a 77 y.o. Caucasian female whose past medical history and cardiac risk factors include: Pericardial effusion/acute pericarditis, aortic atherosclerosis (nongated CT study) Hx of DVT 2021, Colon cancer with metastasis dx 2021 on treatment and no prior surgery, anemia, former smoker.   Pericardial effusion Pericarditis, unspecified chronicity, unspecified type Diagnosed with pericardial effusion/acute pericarditis in June 2024. Has completed indomethacin 50 mg p.o. 3 times daily for 1 week. Currently taking indomethacin 25 mg p.o. 3 times daily for the remaining 2 weeks. Once she completes indomethacin course would like her to recheck ESR and CRP (she will call us and will place orders than).  Also when she completes indomethacin would like to repeat echocardiogram to reevaluate pericardial effusion and tamponade physiology-check limited echo.  Once she has completed the course of indomethacin she can resume Lovenox that she was taking chronically given her history of DVT. Will defer reinitiation of Lovenox to Dr. Thornton Papas.  Continue colchicine for total of 3 months.  Atherosclerosis of aorta (HCC) Most recent lipid profile reviewed. Continue Lipitor.  Bruit of right carotid artery Right carotid bruit noted on physical examination. Check carotid duplex.  Colon cancer metastasized to liver [facility]) Follows with oncology  regularly. She has known chronic anemia given her history of cancer/on anticoagulation given her history of DVT/chemotherapy. Status was discharged from the hospital she has already received 2 units of packed red blood cells.  History of DVT (deep vein thrombosis) Restart Lovenox therapy after the completion of indomethacin  FINAL MEDICATION LIST END OF ENCOUNTER: No orders of the defined types were placed in this encounter.   Medications Discontinued During This Encounter  Medication Reason   potassium chloride (KLOR-CON) 10 MEQ tablet      Current Outpatient Medications:    albuterol (VENTOLIN HFA) 108 (90 Base) MCG/ACT inhaler, INHALE 2 PUFFS INTO THE LUNGS FOUR TIMES DAILY AS NEEDED (Patient taking differently: Inhale 2 puffs into the lungs every 4 (four) hours as needed for shortness of breath or wheezing. INHALE 2 PUFFS INTO THE LUNGS FOUR TIMES DAILY AS NEEDED), Disp: 6.7 g, Rfl: 2   atorvastatin (LIPITOR) 20 MG tablet, Take 1 tablet (20 mg total) by mouth at bedtime., Disp: 30 tablet, Rfl: 0   Cholecalciferol (VITAMIN D3) 25 MCG (1000 UT) CAPS, Take 1,000 Units by mouth daily., Disp: , Rfl:    colchicine 0.6 MG tablet, Take 1 tablet (0.6 mg total) by mouth daily., Disp: 90 tablet, Rfl: 0   cyanocobalamin (VITAMIN B12) 1000 MCG tablet, Take 1 tablet (1,000 mcg total) by mouth daily., Disp: 30 tablet, Rfl: 0   ferrous sulfate (FEROSUL) 325 (65 FE) MG tablet, TAKE 1 TABLET(325 MG) BY MOUTH DAILY WITH BREAKFAST, Disp: 90 tablet, Rfl: 1   furosemide (LASIX) 20 MG tablet, Take 0.5 tablets (10 mg total) by mouth daily., Disp: 30  tablet, Rfl: 0   indomethacin (INDOCIN) 25 MG capsule, Take 1 capsule (25 mg total) by mouth 3 (three) times daily with meals for 14 days., Disp: 42 capsule, Rfl: 0   lidocaine-prilocaine (EMLA) cream, Apply 1 Application topically as needed., Disp: 30 g, Rfl: 0   magnesium oxide (MAG-OX) 400 (240 Mg) MG tablet, Take 1 tablet (400 mg total) by mouth 2 (two) times  daily., Disp: 60 tablet, Rfl: 2   oxyCODONE-acetaminophen (PERCOCET/ROXICET) 5-325 MG tablet, Take 1 tablet by mouth every 8 (eight) hours as needed for severe pain., Disp: 60 tablet, Rfl: 0   pantoprazole (PROTONIX) 40 MG tablet, Take 1 tablet (40 mg total) by mouth 2 (two) times daily., Disp: 60 tablet, Rfl: 0   BRAFTOVI 75 MG capsule, TAKE 4 CAPSULES ONCE DAILY., Disp: 360 capsule, Rfl: 1   magic mouthwash SOLN, Take 5-10 mLs by mouth 4 (four) times daily as needed for mouth pain. Swish and spit (Patient not taking: Reported on 02/02/2023), Disp: 240 mL, Rfl: 1 No current facility-administered medications for this visit.  Facility-Administered Medications Ordered in Other Visits:    pegfilgrastim-cbqv (UDENYCA) injection 6 mg, 6 mg, Subcutaneous, Once, Thornton Papas B, MD   sodium chloride flush (NS) 0.9 % injection 10 mL, 10 mL, Intravenous, PRN, Ladene Artist, MD, 10 mL at 12/22/21 1100  Orders Placed This Encounter  Procedures   EKG 12-Lead   ECHOCARDIOGRAM LIMITED   PCV CAROTID DUPLEX (BILATERAL)    There are no Patient Instructions on file for this visit.   --Continue cardiac medications as reconciled in final medication list. --Return in about 4 weeks (around 03/05/2023) for Follow up pericarditis, pericardial effusion, review test results. or sooner if needed. --Continue follow-up with your primary care physician regarding the management of your other chronic comorbid conditions.  Patient's questions and concerns were addressed to her satisfaction. She voices understanding of the instructions provided during this encounter.   This note was created using a voice recognition software as a result there may be grammatical errors inadvertently enclosed that do not reflect the nature of this encounter. Every attempt is made to correct such errors.  Tessa Lerner, [place], [facility]  Pager:  [phone] Office: [phone]

## 2023-02-06 ENCOUNTER — Other Ambulatory Visit: Payer: Self-pay

## 2023-02-06 ENCOUNTER — Other Ambulatory Visit: Payer: Self-pay | Admitting: Oncology

## 2023-02-06 DIAGNOSIS — C182 Malignant neoplasm of ascending colon: Secondary | ICD-10-CM

## 2023-02-09 ENCOUNTER — Telehealth: Payer: Self-pay

## 2023-02-09 ENCOUNTER — Inpatient Hospital Stay: Payer: BC Managed Care – PPO

## 2023-02-09 VITALS — BP 141/69 | HR 88 | Temp 98.0°F | Resp 18

## 2023-02-09 DIAGNOSIS — C787 Secondary malignant neoplasm of liver and intrahepatic bile duct: Secondary | ICD-10-CM | POA: Diagnosis not present

## 2023-02-09 DIAGNOSIS — D6481 Anemia due to antineoplastic chemotherapy: Secondary | ICD-10-CM | POA: Diagnosis not present

## 2023-02-09 DIAGNOSIS — Z95828 Presence of other vascular implants and grafts: Secondary | ICD-10-CM

## 2023-02-09 DIAGNOSIS — Z452 Encounter for adjustment and management of vascular access device: Secondary | ICD-10-CM | POA: Diagnosis not present

## 2023-02-09 DIAGNOSIS — Z86718 Personal history of other venous thrombosis and embolism: Secondary | ICD-10-CM | POA: Diagnosis not present

## 2023-02-09 DIAGNOSIS — C182 Malignant neoplasm of ascending colon: Secondary | ICD-10-CM | POA: Diagnosis not present

## 2023-02-09 DIAGNOSIS — I3139 Other pericardial effusion (noninflammatory): Secondary | ICD-10-CM | POA: Diagnosis not present

## 2023-02-09 DIAGNOSIS — Z5112 Encounter for antineoplastic immunotherapy: Secondary | ICD-10-CM | POA: Diagnosis not present

## 2023-02-09 DIAGNOSIS — K7460 Unspecified cirrhosis of liver: Secondary | ICD-10-CM | POA: Diagnosis not present

## 2023-02-09 DIAGNOSIS — D500 Iron deficiency anemia secondary to blood loss (chronic): Secondary | ICD-10-CM | POA: Diagnosis not present

## 2023-02-09 LAB — CBC WITH DIFFERENTIAL (CANCER CENTER ONLY)
Abs Immature Granulocytes: 0.05 10*3/uL (ref 0.00–0.07)
Basophils Absolute: 0.1 10*3/uL (ref 0.0–0.1)
Basophils Relative: 1 %
Eosinophils Absolute: 0.2 10*3/uL (ref 0.0–0.5)
Eosinophils Relative: 2 %
HCT: 29.3 % — ABNORMAL LOW (ref 36.0–46.0)
Hemoglobin: 9.3 g/dL — ABNORMAL LOW (ref 12.0–15.0)
Immature Granulocytes: 1 %
Lymphocytes Relative: 9 %
Lymphs Abs: 0.7 10*3/uL (ref 0.7–4.0)
MCH: 28 pg (ref 26.0–34.0)
MCHC: 31.7 g/dL (ref 30.0–36.0)
MCV: 88.3 fL (ref 80.0–100.0)
Monocytes Absolute: 0.7 10*3/uL (ref 0.1–1.0)
Monocytes Relative: 8 %
Neutro Abs: 6.5 10*3/uL (ref 1.7–7.7)
Neutrophils Relative %: 79 %
Platelet Count: 219 10*3/uL (ref 150–400)
RBC: 3.32 MIL/uL — ABNORMAL LOW (ref 3.87–5.11)
RDW: 16 % — ABNORMAL HIGH (ref 11.5–15.5)
WBC Count: 8.2 10*3/uL (ref 4.0–10.5)
nRBC: 0 % (ref 0.0–0.2)

## 2023-02-09 LAB — BASIC METABOLIC PANEL - CANCER CENTER ONLY
Anion gap: 8 (ref 5–15)
BUN: 35 mg/dL — ABNORMAL HIGH (ref 8–23)
CO2: 21 mmol/L — ABNORMAL LOW (ref 22–32)
Calcium: 8.8 mg/dL — ABNORMAL LOW (ref 8.9–10.3)
Chloride: 110 mmol/L (ref 98–111)
Creatinine: 0.93 mg/dL (ref 0.44–1.00)
GFR, Estimated: >60 mL/min (ref >60)
Glucose, Bld: 82 mg/dL (ref 70–99)
Potassium: 4.1 mmol/L (ref 3.5–5.1)
Sodium: 139 mmol/L (ref 135–145)

## 2023-02-09 MED ORDER — HEPARIN SOD (PORK) LOCK FLUSH 100 UNIT/ML IV SOLN
500.0000 [IU] | Freq: Once | INTRAVENOUS | Status: AC
  Administered 2023-02-09: 500 [IU]

## 2023-02-09 MED ORDER — SODIUM CHLORIDE 0.9% FLUSH
10.0000 mL | Freq: Once | INTRAVENOUS | Status: AC
  Administered 2023-02-09: 10 mL

## 2023-02-09 NOTE — Telephone Encounter (Signed)
-----   Message from Ladene Artist, MD sent at 02/09/2023  3:36 PM EDT ----- Please call patient, the hemoglobin and potassium are better, continue holding potassium, follow-up as scheduled

## 2023-02-09 NOTE — Telephone Encounter (Signed)
Patient gave verbal understanding and had no further questions or concerns  

## 2023-02-16 ENCOUNTER — Encounter: Payer: Self-pay | Admitting: *Deleted

## 2023-02-16 ENCOUNTER — Inpatient Hospital Stay: Payer: BC Managed Care – PPO

## 2023-02-16 ENCOUNTER — Telehealth: Payer: Self-pay | Admitting: Oncology

## 2023-02-16 ENCOUNTER — Inpatient Hospital Stay: Payer: BC Managed Care – PPO | Attending: Oncology | Admitting: Oncology

## 2023-02-16 VITALS — BP 139/60 | HR 75 | Temp 98.1°F | Resp 18 | Ht 67.0 in | Wt 146.7 lb

## 2023-02-16 DIAGNOSIS — K7460 Unspecified cirrhosis of liver: Secondary | ICD-10-CM | POA: Diagnosis not present

## 2023-02-16 DIAGNOSIS — C182 Malignant neoplasm of ascending colon: Secondary | ICD-10-CM

## 2023-02-16 DIAGNOSIS — Z5112 Encounter for antineoplastic immunotherapy: Secondary | ICD-10-CM | POA: Insufficient documentation

## 2023-02-16 DIAGNOSIS — Z86718 Personal history of other venous thrombosis and embolism: Secondary | ICD-10-CM | POA: Diagnosis not present

## 2023-02-16 DIAGNOSIS — D500 Iron deficiency anemia secondary to blood loss (chronic): Secondary | ICD-10-CM | POA: Insufficient documentation

## 2023-02-16 DIAGNOSIS — C787 Secondary malignant neoplasm of liver and intrahepatic bile duct: Secondary | ICD-10-CM | POA: Diagnosis not present

## 2023-02-16 DIAGNOSIS — Z7901 Long term (current) use of anticoagulants: Secondary | ICD-10-CM | POA: Diagnosis not present

## 2023-02-16 DIAGNOSIS — R161 Splenomegaly, not elsewhere classified: Secondary | ICD-10-CM | POA: Insufficient documentation

## 2023-02-16 LAB — CBC WITH DIFFERENTIAL (CANCER CENTER ONLY)
Abs Immature Granulocytes: 0.09 10*3/uL — ABNORMAL HIGH (ref 0.00–0.07)
Basophils Absolute: 0.1 10*3/uL (ref 0.0–0.1)
Basophils Relative: 1 %
Eosinophils Absolute: 0.2 10*3/uL (ref 0.0–0.5)
Eosinophils Relative: 3 %
HCT: 27.6 % — ABNORMAL LOW (ref 36.0–46.0)
Hemoglobin: 8.5 g/dL — ABNORMAL LOW (ref 12.0–15.0)
Immature Granulocytes: 1 %
Lymphocytes Relative: 10 %
Lymphs Abs: 0.7 10*3/uL (ref 0.7–4.0)
MCH: 27.7 pg (ref 26.0–34.0)
MCHC: 30.8 g/dL (ref 30.0–36.0)
MCV: 89.9 fL (ref 80.0–100.0)
Monocytes Absolute: 0.9 10*3/uL (ref 0.1–1.0)
Monocytes Relative: 12 %
Neutro Abs: 5.3 10*3/uL (ref 1.7–7.7)
Neutrophils Relative %: 73 %
Platelet Count: 222 10*3/uL (ref 150–400)
RBC: 3.07 MIL/uL — ABNORMAL LOW (ref 3.87–5.11)
RDW: 17 % — ABNORMAL HIGH (ref 11.5–15.5)
WBC Count: 7.3 10*3/uL (ref 4.0–10.5)
nRBC: 0 % (ref 0.0–0.2)

## 2023-02-16 LAB — CEA (ACCESS): CEA (CHCC): 25.32 ng/mL — ABNORMAL HIGH (ref 0.00–5.00)

## 2023-02-16 LAB — CMP (CANCER CENTER ONLY)
ALT: 8 U/L (ref 0–44)
AST: 13 U/L — ABNORMAL LOW (ref 15–41)
Albumin: 3.4 g/dL — ABNORMAL LOW (ref 3.5–5.0)
Alkaline Phosphatase: 94 U/L (ref 38–126)
Anion gap: 7 (ref 5–15)
BUN: 28 mg/dL — ABNORMAL HIGH (ref 8–23)
CO2: 21 mmol/L — ABNORMAL LOW (ref 22–32)
Calcium: 8.9 mg/dL (ref 8.9–10.3)
Chloride: 110 mmol/L (ref 98–111)
Creatinine: 0.78 mg/dL (ref 0.44–1.00)
GFR, Estimated: >60 mL/min (ref >60)
Glucose, Bld: 74 mg/dL (ref 70–99)
Potassium: 4.2 mmol/L (ref 3.5–5.1)
Sodium: 138 mmol/L (ref 135–145)
Total Bilirubin: 0.2 mg/dL — ABNORMAL LOW (ref 0.3–1.2)
Total Protein: 6.2 g/dL — ABNORMAL LOW (ref 6.5–8.1)

## 2023-02-16 LAB — MAGNESIUM: Magnesium: 1.5 mg/dL — ABNORMAL LOW (ref 1.7–2.4)

## 2023-02-16 MED ORDER — MAGNESIUM SULFATE 2 GM/50ML IV SOLN
2.0000 g | Freq: Once | INTRAVENOUS | Status: AC
  Administered 2023-02-16: 2 g via INTRAVENOUS
  Filled 2023-02-16: qty 50

## 2023-02-16 MED ORDER — SODIUM CHLORIDE 0.9 % IV SOLN: Freq: Once | INTRAVENOUS | Status: AC

## 2023-02-16 MED ORDER — SODIUM CHLORIDE 0.9 % IV SOLN
6.0000 mg/kg | Freq: Once | INTRAVENOUS | Status: AC
  Administered 2023-02-16: 400 mg via INTRAVENOUS
  Filled 2023-02-16: qty 20

## 2023-02-16 MED ORDER — HEPARIN SOD (PORK) LOCK FLUSH 100 UNIT/ML IV SOLN
500.0000 [IU] | Freq: Once | INTRAVENOUS | Status: AC | PRN
  Administered 2023-02-16: 500 [IU]

## 2023-02-16 MED ORDER — SODIUM CHLORIDE 0.9% FLUSH
10.0000 mL | INTRAVENOUS | Status: DC | PRN
  Administered 2023-02-16: 10 mL

## 2023-02-16 NOTE — Progress Notes (Signed)
Patient seen by Dr. Truett Perna today  Vitals are within treatment parameters.  Labs reviewed by Dr. Truett Perna and are not all within treatment parameters. Mg+ 1.5--ok to proceed  Per physician team, patient is ready for treatment. Please note that modifications are being made to the treatment plan including Please administer 2 grams IV Mg+ today as well.

## 2023-02-16 NOTE — Patient Instructions (Signed)
[facility] AT [facility]   Discharge Instructions: Thank you for choosing [facility] to provide your oncology and hematology care.   If you have a lab appointment with the Cancer Center, please go directly to the Cancer Center and check in at the registration area.   Wear comfortable clothing and clothing appropriate for easy access to any Portacath or PICC line.   We strive to give you quality time with your provider. You may need to reschedule your appointment if you arrive late (15 or more minutes).  Arriving late affects you and other patients whose appointments are after yours.  Also, if you miss three or more appointments without notifying the office, you may be dismissed from the clinic at the provider's discretion.      For prescription refill requests, have your pharmacy contact our office and allow 72 hours for refills to be completed.    Today you received the following chemotherapy and/or immunotherapy agents Panitumumab (VECTIBIX).      To help prevent nausea and vomiting after your treatment, we encourage you to take your nausea medication as directed.  BELOW ARE SYMPTOMS THAT SHOULD BE REPORTED IMMEDIATELY: *FEVER GREATER THAN 100.4 F (38 C) OR HIGHER *CHILLS OR SWEATING *NAUSEA AND VOMITING THAT IS NOT CONTROLLED WITH YOUR NAUSEA MEDICATION *UNUSUAL SHORTNESS OF BREATH *UNUSUAL BRUISING OR BLEEDING *URINARY PROBLEMS (pain or burning when urinating, or frequent urination) *BOWEL PROBLEMS (unusual diarrhea, constipation, pain near the anus) TENDERNESS IN MOUTH AND THROAT WITH OR WITHOUT PRESENCE OF ULCERS (sore throat, sores in mouth, or a toothache) UNUSUAL RASH, SWELLING OR PAIN  UNUSUAL VAGINAL DISCHARGE OR ITCHING   Items with * indicate a potential emergency and should be followed up as soon as possible or go to the Emergency Department if any problems should occur.  Please show the CHEMOTHERAPY ALERT CARD or IMMUNOTHERAPY ALERT  CARD at check-in to the Emergency Department and triage nurse.  Should you have questions after your visit or need to cancel or reschedule your appointment, please contact [name]  Dept: [phone]  and follow the prompts.  Office hours are 8:00 a.m. to 4:30 p.m. Monday - Friday. Please note that voicemails left after 4:00 p.m. may not be returned until the following business day.  We are closed weekends and major holidays. You have access to a nurse at all times for urgent questions. Please call the main number to the clinic Dept: [phone] and follow the prompts.   For any non-urgent questions, you may also contact your provider using MyChart. We now offer e-Visits for anyone 69 and older to request care online for non-urgent symptoms. For details visit [email or web address].   Also download the MyChart app! Go to the app store, search "MyChart", open the app, select [facility], and log in with your MyChart username and password.  Panitumumab Injection What is this medication? PANITUMUMAB (pan i TOOM ue mab) treats colorectal cancer. It works by blocking a protein that causes cancer cells to grow and multiply. This helps to slow or stop the spread of cancer cells. It is a monoclonal antibody. This medicine may be used for other purposes; ask your health care provider or pharmacist if you have questions. COMMON BRAND NAME(S): Vectibix What should I tell my care team before I take this medication? They need to know if you have any of these conditions: Eye disease Low levels of magnesium in the blood Lung disease An unusual or allergic  reaction to panitumumab, other medications, foods, dyes, or preservatives Pregnant or trying to get pregnant Breast-feeding How should I use this medication? This medication is injected into a vein. It is given by your care team in a hospital or clinic setting. Talk to your care team about the use of this medication  in children. Special care may be needed. Overdosage: If you think you have taken too much of this medicine contact a poison control center or emergency room at once. NOTE: This medicine is only for you. Do not share this medicine with others. What if I miss a dose? Keep appointments for follow-up doses. It is important not to miss your dose. Call your care team if you are unable to keep an appointment. What may interact with this medication? Bevacizumab This list may not describe all possible interactions. Give your health care provider a list of all the medicines, herbs, non-prescription drugs, or dietary supplements you use. Also tell them if you smoke, drink alcohol, or use illegal drugs. Some items may interact with your medicine. What should I watch for while using this medication? Your condition will be monitored carefully while you are receiving this medication. This medication may make you feel generally unwell. This is not uncommon as chemotherapy can affect healthy cells as well as cancer cells. Report any side effects. Continue your course of treatment even though you feel ill unless your care team tells you to stop. This medication can make you more sensitive to the sun. Keep out of the sun while receiving this medication and for 2 months after stopping therapy. If you cannot avoid being in the sun, wear protective clothing and sunscreen. Do not use sun lamps, tanning beds, or tanning booths. Check with your care team if you have severe diarrhea, nausea, and vomiting or if you sweat a lot. The loss of too much body fluid may make it dangerous for you to take this medication. This medication may cause serious skin reactions. They can happen weeks to months after starting the medication. Contact your care team right away if you notice fevers or flu-like symptoms with a rash. The rash may be red or purple and then turn into blisters or peeling of the skin. You may also notice a red rash with  swelling of the face, lips, or lymph nodes in your neck or under your arms. Talk to your care team if you may be pregnant. Serious birth defects can occur if you take this medication during pregnancy and for 2 months after the last dose. Contraception is recommended while taking this medication and for 2 months after the last dose. Your care team can help you find the option that works for you. Do not breastfeed while taking this medication and for 2 months after the last dose. This medication may cause infertility. Talk to your care team if you are concerned about your fertility. What side effects may I notice from receiving this medication? Side effects that you should report to your care team as soon as possible: Allergic reactions--skin rash, itching, hives, swelling of the face, lips, tongue, or throat Dry cough, shortness of breath or trouble breathing Eye pain, redness, irritation, or discharge with blurry or decreased vision Infusion reactions--chest pain, shortness of breath or trouble breathing, feeling faint or lightheaded Low magnesium level--muscle pain or cramps, unusual weakness or fatigue, fast or irregular heartbeat, tremors Low potassium level--muscle pain or cramps, unusual weakness or fatigue, fast or irregular heartbeat, constipation Redness, blistering, peeling, or loosening  of the skin, including inside the mouth Skin reactions on sun-exposed areas Side effects that usually do not require medical attention (report to your care team if they continue or are bothersome): Change in nail shape, thickness, or color Diarrhea Dry skin Fatigue Nausea Vomiting This list may not describe all possible side effects. Call your doctor for medical advice about side effects. You may report side effects to FDA at 1-800-FDA-1088. Where should I keep my medication? This medication is given in a hospital or clinic. It will not be stored at home. NOTE: This sheet is a summary. It may not  cover all possible information. If you have questions about this medicine, talk to your doctor, pharmacist, or health care provider.  2024 Elsevier/Gold Standard (2021-12-14 00:00:00)  Magnesium Sulfate Injection What is this medication? MAGNESIUM SULFATE (mag NEE zee um SUL fate) prevents and treats low levels of magnesium in your body. It may also be used to prevent and treat seizures during pregnancy in people with high blood pressure disorders, such as preeclampsia or eclampsia. Magnesium plays an important role in maintaining the health of your muscles and nervous system. This medicine may be used for other purposes; ask your health care provider or pharmacist if you have questions. What should I tell my care team before I take this medication? They need to know if you have any of these conditions: Heart disease History of irregular heart beat Kidney disease An unusual or allergic reaction to magnesium sulfate, medications, foods, dyes, or preservatives Pregnant or trying to get pregnant Breast-feeding How should I use this medication? This medication is for infusion into a vein. It is given in a hospital or clinic setting. Talk to your care team about the use of this medication in children. While this medication may be prescribed for selected conditions, precautions do apply. Overdosage: If you think you have taken too much of this medicine contact a poison control center or emergency room at once. NOTE: This medicine is only for you. Do not share this medicine with others. What if I miss a dose? This does not apply. What may interact with this medication? Certain medications for anxiety or sleep Certain medications for seizures, such phenobarbital Digoxin Medications that relax muscles for surgery Narcotic medications for pain This list may not describe all possible interactions. Give your health care provider a list of all the medicines, herbs, non-prescription drugs, or dietary  supplements you use. Also tell them if you smoke, drink alcohol, or use illegal drugs. Some items may interact with your medicine. What should I watch for while using this medication? Your condition will be monitored carefully while you are receiving this medication. You may need blood work done while you are receiving this medication. What side effects may I notice from receiving this medication? Side effects that you should report to your care team as soon as possible: Allergic reactions--skin rash, itching, hives, swelling of the face, lips, tongue, or throat High magnesium level--confusion, drowsiness, facial flushing, redness, sweating, muscle weakness, fast or irregular heartbeat, trouble breathing Low blood pressure--dizziness, feeling faint or lightheaded, blurry vision Side effects that usually do not require medical attention (report to your care team if they continue or are bothersome): Headache Nausea This list may not describe all possible side effects. Call your doctor for medical advice about side effects. You may report side effects to FDA at 1-800-FDA-1088. Where should I keep my medication? This medication is given in a hospital or clinic and will not be stored  at home. NOTE: This sheet is a summary. It may not cover all possible information. If you have questions about this medicine, talk to your doctor, pharmacist, or health care provider.  2024 Elsevier/Gold Standard (2021-04-13 00:00:00)

## 2023-02-16 NOTE — Progress Notes (Signed)
[facility] OFFICE PROGRESS NOTE   Diagnosis: Colon cancer  INTERVAL HISTORY:   Stacy Burns returns as scheduled.  She continues encorafenib and panitumumab.  No nosebleeds.  No chest pain.  She received 2 units of packed red blood cells 02/03/2023.  She reports feeling better after the Red cell transfusions.  Objective:  Vital signs in last 24 hours:  Blood pressure 139/60, pulse 75, temperature 98.1 F (36.7 C), temperature source Oral, resp. rate 18, height 5\' 7"  (1.702 m), weight 146 lb 11.2 oz (66.5 kg), SpO2 100 %.    HEENT: No thrush or ulcers  Resp: Lungs clear bilaterally Cardio: Regular rate and rhythm GI: Nontender, no mass, no hepatosplenomegaly Vascular: No leg edema  Skin: Mild acne type rash over the face, the area of paronychia at the right fourth toe has improved  Portacath/PICC-without erythema  Lab Results:  Lab Results  Component Value Date   WBC 7.3 02/16/2023   HGB 8.5 (L) 02/16/2023   HCT 27.6 (L) 02/16/2023   MCV 89.9 02/16/2023   PLT 222 02/16/2023   NEUTROABS 5.3 02/16/2023    CMP  Lab Results  Component Value Date   NA 138 02/16/2023   K 4.2 02/16/2023   CL 110 02/16/2023   CO2 21 (L) 02/16/2023   GLUCOSE 74 02/16/2023   BUN 28 (H) 02/16/2023   CREATININE 0.78 02/16/2023   CALCIUM 8.9 02/16/2023   PROT 6.2 (L) 02/16/2023   ALBUMIN 3.4 (L) 02/16/2023   AST 13 (L) 02/16/2023   ALT 8 02/16/2023   ALKPHOS 94 02/16/2023   BILITOT 0.2 (L) 02/16/2023   GFRNONAA >60 02/16/2023   GFRAA >60 05/06/2020    Lab Results  Component Value Date   CEA1 54.57 (H) 12/30/2020   CEA 25.32 (H) 02/16/2023     Medications: I have reviewed the patient's current medications.   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  Cirrhosis.  Innumerable liver lesions. Colonoscopy 12/18/2019-ulcerated partially obstructing  large mass in the mid ascending colon.  Biopsy invasive adenocarcinoma; preserved expression major MMR proteins Upper endoscopy 12/18/2019-normal esophagus, stomach, examined duodenum CEA 12/19/2019-8,923 Biopsy liver lesion 12/23/2019-adenocarcinoma Foundation 1-K-ras wild-type; tumor mutation burden greater than or equal to 10; microsatellite stable; BRAF V600E Cycle 1 FOLFOX 01/01/2020 Cycle 2 FOLFOX 01/15/2020  Cycle 3 FOLFOX 01/29/2020, Udenyca added Cycle 4 FOLFOX 02/12/2020, Udenyca held Cycle 5 FOLFOX 02/26/2020, Udenyca Cycle 6 FOLFOX 03/11/2020, Udenyca held CT abdomen/pelvis 03/16/2020-stable diffuse liver lesions, stable strictured appearing ascending colon, possible pericolonic implant, vague left lower lobe nodule Cycle 1 FOLFIRI/Avastin 04/06/2020 Cycle 2 FOLFIRI/Avastin 04/21/2020 Cycle 3 FOLFIRI/Avastin 05/06/2020 Cycle 4 FOLFIRI/Avastin 05/20/2020 Cycle 5 FOLFIRI/Avastin 06/03/2020 CTs 06/15/2020-mild to moderate improvement in hepatic metastasis.  No new or progressive disease.  Narrowing within the proximal ascending colon with upstream mild cecal and terminal ileum dilatation, similar. Cycle 6 FOLFIRI/Avastin 06/17/2020 Cycle 7 FOLFIRI/Avastin 07/01/2020 Cycle 8 FOLFIRI/Avastin 07/15/2020 Cycle 9 FOLFIRI/Avastin 07/29/2020-5-FU bolus eliminated, 5-FU infusion and irinotecan dose reduced Cycle 10 FOLFIRI/Avastin 08/12/2020 CTs 08/23/2020- mild residual wall thickening involving the cecum/ascending colon.  Numerous liver metastases improved. Cycle 11 FOLFIRI/Avastin 08/25/2020 Cycle 12 FOLFIRI/Avastin 09/16/2020 Cycle 13 FOLFIRI/Avastin 10/07/2020 Cycle 14 FOLFIRI/Avastin 10/28/2020 Cycle 15 FOLFIRI/Avastin 11/18/2020 CT abdomen/pelvis 12/06/2020-mild improvement in multifocal hepatic metastases, wall thickening at the ascending colon with pericolonic inflammatory changes Cycle 16 FOLFIRI/Avastin 12/09/2020 Cycle 17 FOLFIRI/Avastin 12/30/2020 Cycle 18 FOLFIRI/Avastin 01/20/2021 Cycle 19  FOLFIRI/Avastin 02/10/2021 Cycle 20  FOLFIRI/Avastin 03/03/2021 Cycle 21 FOLFIRI/Avastin 03/24/2021 Cycle 22 FOLFIRI/Avastin 04/14/2021 CTs 05/04/2021-slight improvement in hepatic metastatic disease. Cycle 23 FOLFIRI/Avastin 05/05/2021 Cycle 24 FOLFIRI/Avastin 05/25/2021 Cycle 25 FOLFIRI/Avastin 06/15/2021 Cycle 26 FOLFIRI/Avastin 07/13/2021 Cycle 27 FOLFIRI/Avastin 08/02/2021 CTs 08/30/2021-majority of liver lesions are unchanged in size, 1 lesion adjacent to the gallbladder has increased, no adenopathy, persistent pericolonic stranding and peritoneal thickening at the cecum Progressive rise in CEA 08/31/2021-treatment changed to Xeloda/bevacizumab, began [place] 09/22/2021 CEA stable 11/03/2021 Xeloda/bevacizumab continued CTs 01/10/2022-increase in size of some of the liver lesions, others are stable, new tiny right lower lobe nodule, increased dilation of distal small bowel with increased wall thickening at the terminal ileum and ileocecal valve with 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, stable bilateral paracolic gutter peritoneal thickening Panitumumab/ Encorafenib 01/12/2022 (encorafenib 01/19/2022) Panitumumab 02/02/2022 Panitumumab 02/20/2022 Panitumumab 03/06/2022 Panitumumab 03/20/2022 Panitumumab 04/03/2022 CTs 04/14/2022-decrease size of liver metastases, new borderline subcarinal adenopathy, improved peritoneal thickening, cirrhosis or pseudocirrhosis, persistent dilated cecum Encorafenib and Panitumumab continued Panitumumab 04/18/2022 Panitumumab 05/05/2022 Panitumumab 05/19/2022 Panitumumab 06/02/2022 Panitumumab 06/16/2022, 4g IV mag and begin oral mag po once daily  Panitumumab 06/30/2022 Panitumumab 07/14/2022, 2 g IV magnesium, increase oral magnesium to 400 mg twice daily Panitumumab 07/28/2022, 2 g IV magnesium CTs 07/28/2022-no change in hepatic metastases, no new signs of metastatic disease, persistent mural thickening at the terminal ileum/cecum mild splenomegaly  unchanged Panitumumab 08/18/2022 Panitumumab 09/01/2022 Panitumumab 09/15/2022 Panitumumab 09/29/2022 Panitumumab 10/13/2022 Panitumumab 10/27/2022 CTs 11/03/2022-unchanged hepatic metastases, no new lesions, 3.7 cm soft tissue density at the medial left kidney etiology unclear, improvement in wall thickening at the ileocecal junction development of appendiceal dilatation Panitumumab 11/10/2022 Panitumumab 11/24/2022 Panitumumab 12/08/2022 Panitumumab 12/22/2022 Panitumumab 01/05/2023 Panitumumab 02/02/2023 Panitumumab 02/16/2023 Anemia secondary to GI blood loss, and chemotherapy, on oral iron BID  Red cell transfusion 02/02/2023 Cirrhosis 12/26/2019-hospital admission for right lower extremity DVT and GI bleed, treated with heparin anticoagulation beginning 12/26/2019, converted to Lovenox at discharge 12/29/2019 Lovenox reduced to 40 mg daily 02/10/2021 secondary to bruising and nosebleeding History of low-grade fever-likely "tumor" fever COVID-19 infection January 2021 Admission 01/19/2023 with dyspnea/orthopnea, pericardial effusion, EKG changes suggestive of pericarditis Echocardiogram 01/20/2023-LVEF 60-65%, small to moderate circumferential pericardial effusion without tamponade physiology Anemia secondary to chemotherapy, chronic disease, and phlebotomy 2 units of packed red blood cells 02/03/2023      Disposition: Stacy Burns appears unchanged.  She continues to tolerate the encorafenib and panitumumab well.  She will complete another cycle today.  She is scheduled to follow-up with a repeat echocardiogram next week.  She will return for an office visit and panitumumab in 2 weeks.  She will undergo a restaging CT evaluation prior to the office visit in 2 weeks.  She will receive magnesium supplementation today.  Thornton Papas, MD  02/16/2023  8:59 AM

## 2023-02-16 NOTE — Telephone Encounter (Signed)
Scheduled appointments per 7/5 secure chat. Patient is aware of the made appointments.

## 2023-02-19 ENCOUNTER — Telehealth: Payer: Self-pay

## 2023-02-19 NOTE — Telephone Encounter (Signed)
Pt took last dose of indomethacin on 02/17/2023

## 2023-02-20 ENCOUNTER — Ambulatory Visit (HOSPITAL_COMMUNITY)
Admission: RE | Admit: 2023-02-20 | Discharge: 2023-02-20 | Disposition: A | Discharge status: Home / Self Care | Payer: BC Managed Care – PPO | Source: Ambulatory Visit | Attending: Cardiology | Admitting: Cardiology

## 2023-02-20 DIAGNOSIS — I3139 Other pericardial effusion (noninflammatory): Secondary | ICD-10-CM

## 2023-02-20 DIAGNOSIS — I517 Cardiomegaly: Secondary | ICD-10-CM | POA: Insufficient documentation

## 2023-02-20 DIAGNOSIS — Z87891 Personal history of nicotine dependence: Secondary | ICD-10-CM | POA: Diagnosis not present

## 2023-02-20 DIAGNOSIS — I319 Disease of pericardium, unspecified: Secondary | ICD-10-CM

## 2023-02-20 LAB — ECHOCARDIOGRAM LIMITED
Calc EF: 75.2 %
S' Lateral: 2.9 cm
Single Plane A2C EF: 75.9 %
Single Plane A4C EF: 76.6 %

## 2023-02-20 NOTE — Telephone Encounter (Signed)
Please order and release Crp (not high sensitive), ESR, cmp.  Have her get an echo as scheduled.   She can start her lovenox as filled by her heme/onc.  Dr. Odis Hollingshead

## 2023-02-21 ENCOUNTER — Other Ambulatory Visit: Payer: Self-pay

## 2023-02-21 DIAGNOSIS — I319 Disease of pericardium, unspecified: Secondary | ICD-10-CM

## 2023-02-21 DIAGNOSIS — I3139 Other pericardial effusion (noninflammatory): Secondary | ICD-10-CM

## 2023-02-21 NOTE — Progress Notes (Signed)
LVM to call office back.

## 2023-02-21 NOTE — Telephone Encounter (Signed)
Pt aware.

## 2023-02-21 NOTE — Telephone Encounter (Signed)
Labs ordered. lvm for pt

## 2023-02-24 ENCOUNTER — Other Ambulatory Visit: Payer: Self-pay | Admitting: Oncology

## 2023-02-28 ENCOUNTER — Ambulatory Visit: Payer: BC Managed Care – PPO

## 2023-02-28 ENCOUNTER — Other Ambulatory Visit: Payer: Self-pay

## 2023-02-28 DIAGNOSIS — I3139 Other pericardial effusion (noninflammatory): Secondary | ICD-10-CM | POA: Diagnosis not present

## 2023-02-28 DIAGNOSIS — I319 Disease of pericardium, unspecified: Secondary | ICD-10-CM | POA: Diagnosis not present

## 2023-02-28 DIAGNOSIS — R0989 Other specified symptoms and signs involving the circulatory and respiratory systems: Secondary | ICD-10-CM

## 2023-03-01 ENCOUNTER — Encounter: Payer: Self-pay | Admitting: Oncology

## 2023-03-01 ENCOUNTER — Encounter (HOSPITAL_BASED_OUTPATIENT_CLINIC_OR_DEPARTMENT_OTHER): Payer: Self-pay

## 2023-03-01 ENCOUNTER — Ambulatory Visit (HOSPITAL_BASED_OUTPATIENT_CLINIC_OR_DEPARTMENT_OTHER)
Admission: RE | Admit: 2023-03-01 | Discharge: 2023-03-01 | Disposition: A | Discharge status: Home / Self Care | Payer: BC Managed Care – PPO | Source: Ambulatory Visit | Attending: Oncology | Admitting: Oncology

## 2023-03-01 ENCOUNTER — Inpatient Hospital Stay: Payer: BC Managed Care – PPO

## 2023-03-01 DIAGNOSIS — C182 Malignant neoplasm of ascending colon: Secondary | ICD-10-CM

## 2023-03-01 DIAGNOSIS — R162 Hepatomegaly with splenomegaly, not elsewhere classified: Secondary | ICD-10-CM | POA: Diagnosis not present

## 2023-03-01 DIAGNOSIS — C189 Malignant neoplasm of colon, unspecified: Secondary | ICD-10-CM | POA: Diagnosis not present

## 2023-03-01 DIAGNOSIS — Z95828 Presence of other vascular implants and grafts: Secondary | ICD-10-CM

## 2023-03-01 LAB — CBC WITH DIFFERENTIAL (CANCER CENTER ONLY)
Abs Immature Granulocytes: 0.02 10*3/uL (ref 0.00–0.07)
Basophils Absolute: 0.1 10*3/uL (ref 0.0–0.1)
Basophils Relative: 1 %
Eosinophils Absolute: 0.2 10*3/uL (ref 0.0–0.5)
Eosinophils Relative: 5 %
HCT: 30.8 % — ABNORMAL LOW (ref 36.0–46.0)
Hemoglobin: 9.4 g/dL — ABNORMAL LOW (ref 12.0–15.0)
Immature Granulocytes: 0 %
Lymphocytes Relative: 19 %
Lymphs Abs: 1 10*3/uL (ref 0.7–4.0)
MCH: 27.2 pg (ref 26.0–34.0)
MCHC: 30.5 g/dL (ref 30.0–36.0)
MCV: 89 fL (ref 80.0–100.0)
Monocytes Absolute: 0.6 10*3/uL (ref 0.1–1.0)
Monocytes Relative: 11 %
Neutro Abs: 3.4 10*3/uL (ref 1.7–7.7)
Neutrophils Relative %: 64 %
Platelet Count: 237 10*3/uL (ref 150–400)
RBC: 3.46 MIL/uL — ABNORMAL LOW (ref 3.87–5.11)
RDW: 16.6 % — ABNORMAL HIGH (ref 11.5–15.5)
WBC Count: 5.4 10*3/uL (ref 4.0–10.5)
nRBC: 0 % (ref 0.0–0.2)

## 2023-03-01 LAB — COMPREHENSIVE METABOLIC PANEL
ALT: 11 IU/L (ref 0–32)
AST: 16 IU/L (ref 0–40)
Albumin: 3.7 g/dL — ABNORMAL LOW (ref 3.8–4.8)
Alkaline Phosphatase: 171 IU/L — ABNORMAL HIGH (ref 44–121)
BUN/Creatinine Ratio: 28 (ref 12–28)
BUN: 25 mg/dL (ref 8–27)
Bilirubin Total: <0.2 mg/dL (ref 0.0–1.2)
CO2: 23 mmol/L (ref 20–29)
Calcium: 9 mg/dL (ref 8.7–10.3)
Chloride: 104 mmol/L (ref 96–106)
Creatinine, Ser: 0.89 mg/dL (ref 0.57–1.00)
Globulin, Total: 2.9 g/dL (ref 1.5–4.5)
Glucose: 103 mg/dL — ABNORMAL HIGH (ref 70–99)
Potassium: 4.4 mmol/L (ref 3.5–5.2)
Sodium: 140 mmol/L (ref 134–144)
Total Protein: 6.6 g/dL (ref 6.0–8.5)
eGFR: 67 mL/min/{1.73_m2} (ref >59)

## 2023-03-01 LAB — CMP (CANCER CENTER ONLY)
ALT: 11 U/L (ref 0–44)
AST: 15 U/L (ref 15–41)
Albumin: 3.6 g/dL (ref 3.5–5.0)
Alkaline Phosphatase: 127 U/L — ABNORMAL HIGH (ref 38–126)
Anion gap: 8 (ref 5–15)
BUN: 24 mg/dL — ABNORMAL HIGH (ref 8–23)
CO2: 24 mmol/L (ref 22–32)
Calcium: 9 mg/dL (ref 8.9–10.3)
Chloride: 107 mmol/L (ref 98–111)
Creatinine: 0.74 mg/dL (ref 0.44–1.00)
GFR, Estimated: >60 mL/min (ref >60)
Glucose, Bld: 83 mg/dL (ref 70–99)
Potassium: 4.4 mmol/L (ref 3.5–5.1)
Sodium: 139 mmol/L (ref 135–145)
Total Bilirubin: 0.3 mg/dL (ref 0.3–1.2)
Total Protein: 6.9 g/dL (ref 6.5–8.1)

## 2023-03-01 LAB — SEDIMENTATION RATE: Sed Rate: 30 mm/hr (ref 0–40)

## 2023-03-01 LAB — MAGNESIUM: Magnesium: 2 mg/dL (ref 1.7–2.4)

## 2023-03-01 LAB — C-REACTIVE PROTEIN: CRP: 4 mg/L (ref 0–10)

## 2023-03-01 LAB — CEA (ACCESS): CEA (CHCC): 43.39 ng/mL — ABNORMAL HIGH (ref 0.00–5.00)

## 2023-03-01 MED ORDER — HEPARIN SOD (PORK) LOCK FLUSH 100 UNIT/ML IV SOLN
500.0000 [IU] | Freq: Once | INTRAVENOUS | Status: AC
  Administered 2023-03-01: 500 [IU] via INTRAVENOUS

## 2023-03-01 MED ORDER — SODIUM CHLORIDE 0.9% FLUSH
10.0000 mL | Freq: Once | INTRAVENOUS | Status: AC
  Administered 2023-03-01: 10 mL

## 2023-03-01 MED ORDER — IOHEXOL 300 MG/ML  SOLN
100.0000 mL | Freq: Once | INTRAMUSCULAR | Status: AC | PRN
  Administered 2023-03-01: 85 mL via INTRAVENOUS

## 2023-03-02 ENCOUNTER — Ambulatory Visit (HOSPITAL_BASED_OUTPATIENT_CLINIC_OR_DEPARTMENT_OTHER): Payer: BC Managed Care – PPO

## 2023-03-02 ENCOUNTER — Inpatient Hospital Stay: Payer: BC Managed Care – PPO

## 2023-03-02 ENCOUNTER — Inpatient Hospital Stay: Payer: BC Managed Care – PPO | Admitting: Nurse Practitioner

## 2023-03-02 ENCOUNTER — Encounter: Payer: Self-pay | Admitting: Nurse Practitioner

## 2023-03-02 VITALS — BP 141/59 | HR 70

## 2023-03-02 DIAGNOSIS — C787 Secondary malignant neoplasm of liver and intrahepatic bile duct: Secondary | ICD-10-CM | POA: Diagnosis not present

## 2023-03-02 DIAGNOSIS — Z5112 Encounter for antineoplastic immunotherapy: Secondary | ICD-10-CM | POA: Diagnosis not present

## 2023-03-02 DIAGNOSIS — K7460 Unspecified cirrhosis of liver: Secondary | ICD-10-CM | POA: Diagnosis not present

## 2023-03-02 DIAGNOSIS — Z7901 Long term (current) use of anticoagulants: Secondary | ICD-10-CM | POA: Diagnosis not present

## 2023-03-02 DIAGNOSIS — Z86718 Personal history of other venous thrombosis and embolism: Secondary | ICD-10-CM | POA: Diagnosis not present

## 2023-03-02 DIAGNOSIS — C182 Malignant neoplasm of ascending colon: Secondary | ICD-10-CM

## 2023-03-02 DIAGNOSIS — D500 Iron deficiency anemia secondary to blood loss (chronic): Secondary | ICD-10-CM | POA: Diagnosis not present

## 2023-03-02 DIAGNOSIS — R161 Splenomegaly, not elsewhere classified: Secondary | ICD-10-CM | POA: Diagnosis not present

## 2023-03-02 MED ORDER — HEPARIN SOD (PORK) LOCK FLUSH 100 UNIT/ML IV SOLN
500.0000 [IU] | Freq: Once | INTRAVENOUS | Status: AC | PRN
  Administered 2023-03-02: 500 [IU]

## 2023-03-02 MED ORDER — SODIUM CHLORIDE 0.9% FLUSH
10.0000 mL | INTRAVENOUS | Status: DC | PRN
  Administered 2023-03-02: 10 mL

## 2023-03-02 MED ORDER — SODIUM CHLORIDE 0.9 % IV SOLN: Freq: Once | INTRAVENOUS | Status: AC

## 2023-03-02 MED ORDER — SODIUM CHLORIDE 0.9 % IV SOLN
6.0000 mg/kg | Freq: Once | INTRAVENOUS | Status: AC
  Administered 2023-03-02: 400 mg via INTRAVENOUS
  Filled 2023-03-02: qty 20

## 2023-03-02 NOTE — Progress Notes (Unsigned)
Patient seen by Lisa Thomas NP today  Vitals are within treatment parameters.  Labs reviewed by Lisa Thomas NP and are within treatment parameters.  Per physician team, patient is ready for treatment and there are NO modifications to the treatment plan.     

## 2023-03-02 NOTE — Patient Instructions (Signed)
[facility] AT [facility]   Discharge Instructions: Thank you for choosing [facility] to provide your oncology and hematology care.   If you have a lab appointment with the Cancer Center, please go directly to the Cancer Center and check in at the registration area.   Wear comfortable clothing and clothing appropriate for easy access to any Portacath or PICC line.   We strive to give you quality time with your provider. You may need to reschedule your appointment if you arrive late (15 or more minutes).  Arriving late affects you and other patients whose appointments are after yours.  Also, if you miss three or more appointments without notifying the office, you may be dismissed from the clinic at the provider's discretion.      For prescription refill requests, have your pharmacy contact our office and allow 72 hours for refills to be completed.    Today you received the following chemotherapy and/or immunotherapy agents Panitumumab (VECTIBIX).      To help prevent nausea and vomiting after your treatment, we encourage you to take your nausea medication as directed.  BELOW ARE SYMPTOMS THAT SHOULD BE REPORTED IMMEDIATELY: *FEVER GREATER THAN 100.4 F (38 C) OR HIGHER *CHILLS OR SWEATING *NAUSEA AND VOMITING THAT IS NOT CONTROLLED WITH YOUR NAUSEA MEDICATION *UNUSUAL SHORTNESS OF BREATH *UNUSUAL BRUISING OR BLEEDING *URINARY PROBLEMS (pain or burning when urinating, or frequent urination) *BOWEL PROBLEMS (unusual diarrhea, constipation, pain near the anus) TENDERNESS IN MOUTH AND THROAT WITH OR WITHOUT PRESENCE OF ULCERS (sore throat, sores in mouth, or a toothache) UNUSUAL RASH, SWELLING OR PAIN  UNUSUAL VAGINAL DISCHARGE OR ITCHING   Items with * indicate a potential emergency and should be followed up as soon as possible or go to the Emergency Department if any problems should occur.  Please show the CHEMOTHERAPY ALERT CARD or IMMUNOTHERAPY ALERT  CARD at check-in to the Emergency Department and triage nurse.  Should you have questions after your visit or need to cancel or reschedule your appointment, please contact [name]  Dept: [phone]  and follow the prompts.  Office hours are 8:00 a.m. to 4:30 p.m. Monday - Friday. Please note that voicemails left after 4:00 p.m. may not be returned until the following business day.  We are closed weekends and major holidays. You have access to a nurse at all times for urgent questions. Please call the main number to the clinic Dept: [phone] and follow the prompts.   For any non-urgent questions, you may also contact your provider using MyChart. We now offer e-Visits for anyone 69 and older to request care online for non-urgent symptoms. For details visit [email or web address].   Also download the MyChart app! Go to the app store, search "MyChart", open the app, select [facility], and log in with your MyChart username and password.  Panitumumab Injection What is this medication? PANITUMUMAB (pan i TOOM ue mab) treats colorectal cancer. It works by blocking a protein that causes cancer cells to grow and multiply. This helps to slow or stop the spread of cancer cells. It is a monoclonal antibody. This medicine may be used for other purposes; ask your health care provider or pharmacist if you have questions. COMMON BRAND NAME(S): Vectibix What should I tell my care team before I take this medication? They need to know if you have any of these conditions: Eye disease Low levels of magnesium in the blood Lung disease An unusual or allergic  reaction to panitumumab, other medications, foods, dyes, or preservatives Pregnant or trying to get pregnant Breast-feeding How should I use this medication? This medication is injected into a vein. It is given by your care team in a hospital or clinic setting. Talk to your care team about the use of this medication  in children. Special care may be needed. Overdosage: If you think you have taken too much of this medicine contact a poison control center or emergency room at once. NOTE: This medicine is only for you. Do not share this medicine with others. What if I miss a dose? Keep appointments for follow-up doses. It is important not to miss your dose. Call your care team if you are unable to keep an appointment. What may interact with this medication? Bevacizumab This list may not describe all possible interactions. Give your health care provider a list of all the medicines, herbs, non-prescription drugs, or dietary supplements you use. Also tell them if you smoke, drink alcohol, or use illegal drugs. Some items may interact with your medicine. What should I watch for while using this medication? Your condition will be monitored carefully while you are receiving this medication. This medication may make you feel generally unwell. This is not uncommon as chemotherapy can affect healthy cells as well as cancer cells. Report any side effects. Continue your course of treatment even though you feel ill unless your care team tells you to stop. This medication can make you more sensitive to the sun. Keep out of the sun while receiving this medication and for 2 months after stopping therapy. If you cannot avoid being in the sun, wear protective clothing and sunscreen. Do not use sun lamps, tanning beds, or tanning booths. Check with your care team if you have severe diarrhea, nausea, and vomiting or if you sweat a lot. The loss of too much body fluid may make it dangerous for you to take this medication. This medication may cause serious skin reactions. They can happen weeks to months after starting the medication. Contact your care team right away if you notice fevers or flu-like symptoms with a rash. The rash may be red or purple and then turn into blisters or peeling of the skin. You may also notice a red rash with  swelling of the face, lips, or lymph nodes in your neck or under your arms. Talk to your care team if you may be pregnant. Serious birth defects can occur if you take this medication during pregnancy and for 2 months after the last dose. Contraception is recommended while taking this medication and for 2 months after the last dose. Your care team can help you find the option that works for you. Do not breastfeed while taking this medication and for 2 months after the last dose. This medication may cause infertility. Talk to your care team if you are concerned about your fertility. What side effects may I notice from receiving this medication? Side effects that you should report to your care team as soon as possible: Allergic reactions--skin rash, itching, hives, swelling of the face, lips, tongue, or throat Dry cough, shortness of breath or trouble breathing Eye pain, redness, irritation, or discharge with blurry or decreased vision Infusion reactions--chest pain, shortness of breath or trouble breathing, feeling faint or lightheaded Low magnesium level--muscle pain or cramps, unusual weakness or fatigue, fast or irregular heartbeat, tremors Low potassium level--muscle pain or cramps, unusual weakness or fatigue, fast or irregular heartbeat, constipation Redness, blistering, peeling, or loosening  of the skin, including inside the mouth Skin reactions on sun-exposed areas Side effects that usually do not require medical attention (report to your care team if they continue or are bothersome): Change in nail shape, thickness, or color Diarrhea Dry skin Fatigue Nausea Vomiting This list may not describe all possible side effects. Call your doctor for medical advice about side effects. You may report side effects to FDA at 1-800-FDA-1088. Where should I keep my medication? This medication is given in a hospital or clinic. It will not be stored at home. NOTE: This sheet is a summary. It may not  cover all possible information. If you have questions about this medicine, talk to your doctor, pharmacist, or health care provider.  2024 Elsevier/Gold Standard (2021-12-14 00:00:00)  Magnesium Sulfate Injection What is this medication? MAGNESIUM SULFATE (mag NEE zee um SUL fate) prevents and treats low levels of magnesium in your body. It may also be used to prevent and treat seizures during pregnancy in people with high blood pressure disorders, such as preeclampsia or eclampsia. Magnesium plays an important role in maintaining the health of your muscles and nervous system. This medicine may be used for other purposes; ask your health care provider or pharmacist if you have questions. What should I tell my care team before I take this medication? They need to know if you have any of these conditions: Heart disease History of irregular heart beat Kidney disease An unusual or allergic reaction to magnesium sulfate, medications, foods, dyes, or preservatives Pregnant or trying to get pregnant Breast-feeding How should I use this medication? This medication is for infusion into a vein. It is given in a hospital or clinic setting. Talk to your care team about the use of this medication in children. While this medication may be prescribed for selected conditions, precautions do apply. Overdosage: If you think you have taken too much of this medicine contact a poison control center or emergency room at once. NOTE: This medicine is only for you. Do not share this medicine with others. What if I miss a dose? This does not apply. What may interact with this medication? Certain medications for anxiety or sleep Certain medications for seizures, such phenobarbital Digoxin Medications that relax muscles for surgery Narcotic medications for pain This list may not describe all possible interactions. Give your health care provider a list of all the medicines, herbs, non-prescription drugs, or dietary  supplements you use. Also tell them if you smoke, drink alcohol, or use illegal drugs. Some items may interact with your medicine. What should I watch for while using this medication? Your condition will be monitored carefully while you are receiving this medication. You may need blood work done while you are receiving this medication. What side effects may I notice from receiving this medication? Side effects that you should report to your care team as soon as possible: Allergic reactions--skin rash, itching, hives, swelling of the face, lips, tongue, or throat High magnesium level--confusion, drowsiness, facial flushing, redness, sweating, muscle weakness, fast or irregular heartbeat, trouble breathing Low blood pressure--dizziness, feeling faint or lightheaded, blurry vision Side effects that usually do not require medical attention (report to your care team if they continue or are bothersome): Headache Nausea This list may not describe all possible side effects. Call your doctor for medical advice about side effects. You may report side effects to FDA at 1-800-FDA-1088. Where should I keep my medication? This medication is given in a hospital or clinic and will not be stored  at home. NOTE: This sheet is a summary. It may not cover all possible information. If you have questions about this medicine, talk to your doctor, pharmacist, or health care provider.  2024 Elsevier/Gold Standard (2021-04-13 00:00:00)

## 2023-03-02 NOTE — Progress Notes (Unsigned)
[facility] OFFICE PROGRESS NOTE   Diagnosis:  Colon cancer  INTERVAL HISTORY:   Ms. Waskey returns as scheduled. She continues encorafenib and panitumumab. No n/v, mouth sores, diarrhea. No fever, cough, shortness of breath. Periodic nose bleeds.   Objective:  Vital signs in last 24 hours:  Blood pressure (!) 117/55, pulse 75, temperature 98.1 F (36.7 C), temperature source Oral, resp. rate 18, height 5\' 7"  (1.702 m), weight 142 lb 4.8 oz (64.5 kg), SpO2 100%.    HEENT: No thrush or ulcers Resp: Lungs clear bilaterally Cardio: Regular GI: Abdomen soft and nontender, no hepatosplenomegaly Vascular: No leg edema Portacath without erythema  Lab Results:  Lab Results  Component Value Date   WBC 5.4 03/01/2023   HGB 9.4 (L) 03/01/2023   HCT 30.8 (L) 03/01/2023   MCV 89.0 03/01/2023   PLT 237 03/01/2023   NEUTROABS 3.4 03/01/2023    Imaging:  CT CHEST ABDOMEN PELVIS W CONTRAST  Result Date: 03/01/2023 CLINICAL DATA:  Metastatic colon cancer restaging, ongoing chemotherapy * Tracking Code: BO * EXAM: CT CHEST, ABDOMEN, AND PELVIS WITH CONTRAST TECHNIQUE: Multidetector CT imaging of the chest, abdomen and pelvis was performed following the standard protocol during bolus administration of intravenous contrast. RADIATION DOSE REDUCTION: This exam was performed according to the departmental dose-optimization program which includes automated exposure control, adjustment of the mA and/or kV according to patient size and/or use of iterative reconstruction technique. CONTRAST:  85mL OMNIPAQUE IOHEXOL 300 MG/ML SOLN additional oral enteric contrast COMPARISON:  CT chest angiogram, 01/19/2023, CT chest abdomen pelvis, 11/03/2022 FINDINGS: CT CHEST FINDINGS Cardiovascular: Right chest port catheter. Aortic atherosclerosis. Normal heart size. Left and right coronary artery calcifications. No pericardial effusion. Mediastinum/Nodes: No enlarged mediastinal, hilar, or axillary  lymph nodes. Thyroid gland, trachea, and esophagus demonstrate no significant findings. Lungs/Pleura: Mild predominantly paraseptal emphysema. Occasional small, calcified, benign granulomatous nodules scattered throughout the lungs. No pleural effusion or pneumothorax. Musculoskeletal: No chest wall abnormality. No acute osseous findings. CT ABDOMEN PELVIS FINDINGS Hepatobiliary: Multiple hypodense, treated metastatic liver lesions not significantly changed, for example in the right lobe of the liver, posterior hepatic segment VIII, measuring 1.5 x 0.8 cm (series 2, image 54) and in the subcapsular anterior anterior liver, hepatic segment IV B, measuring 1.3 x 0.7 cm with overlying capsular retraction (series 2, image 64). There has however been significant interval enlargement of a hypodense lesion of the anterior inferior left lobe of the liver, hepatic segment III, measuring 4.3 x 2.5 cm, previously 1.7 x 0.9 cm when measured similarly (series 2, image 71). No gallstones, gallbladder wall thickening, or biliary dilatation. Pancreas: Unremarkable. No pancreatic ductal dilatation or surrounding inflammatory changes. Spleen: Splenomegaly, maximum span 14.2 cm. Adrenals/Urinary Tract: Adrenal glands are unremarkable. Near complete interval resolution of previously seen soft tissue in the left renal pelvis, now measuring no greater than 1.0 x 0.8 cm (series 2, image 70). Kidneys are otherwise normal, without renal calculi, solid lesion, or hydronephrosis. Bladder is unremarkable. Stomach/Bowel: Stomach is within normal limits. Appendix appears normal. Unchanged circumferential mass of the cecum (series 2, image 91, series 5, image 58). Vascular/Lymphatic: Severe aortic atherosclerosis. 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 No enlarged abdominal or pelvic  lymph nodes. Reproductive: No mass or other abnormality. Other: No abdominal wall hernia or abnormality. No ascites. Musculoskeletal: No acute osseous  findings. IMPRESSION: 1. Unchanged circumferential mass of the cecum. 2. Significant interval enlargement of a metastatic lesion of the anterior inferior left lobe of the liver, consistent with worsened metastatic disease. Other small treated liver lesions unchanged. 3. Near complete interval resolution of previously seen soft tissue in the left renal pelvis, of uncertain significance, possibly reflecting treatment response of an unusual metastasis. Attention on follow-up. 4. No evidence of lymphadenopathy or metastatic disease in the chest. 5. Splenomegaly. 6. Coronary artery disease. Aortic Atherosclerosis (ICD10-I70.0) and Emphysema (ICD10-J43.9). Electronically Signed   By: Jearld Lesch M.D.   On: 03/01/2023 17:25    Medications: I have reviewed the patient's current medications.  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  Cirrhosis.  Innumerable liver lesions. Colonoscopy 12/18/2019-ulcerated partially obstructing large mass in the mid ascending colon.  Biopsy invasive adenocarcinoma; preserved expression major MMR proteins Upper endoscopy 12/18/2019-normal esophagus, stomach, examined duodenum CEA 12/19/2019-8,923 Biopsy liver lesion 12/23/2019-adenocarcinoma Foundation 1-K-ras wild-type; tumor mutation burden greater than or equal to 10; microsatellite stable; BRAF V600E Cycle 1 FOLFOX 01/01/2020 Cycle 2 FOLFOX 01/15/2020  Cycle 3 FOLFOX 01/29/2020, Udenyca added Cycle 4 FOLFOX 02/12/2020, Udenyca held Cycle 5 FOLFOX 02/26/2020, Udenyca Cycle 6 FOLFOX 03/11/2020, Udenyca held CT abdomen/pelvis 03/16/2020-stable diffuse liver lesions, stable strictured appearing ascending colon, possible pericolonic implant, vague left lower lobe  nodule Cycle 1 FOLFIRI/Avastin 04/06/2020 Cycle 2 FOLFIRI/Avastin 04/21/2020 Cycle 3 FOLFIRI/Avastin 05/06/2020 Cycle 4 FOLFIRI/Avastin 05/20/2020 Cycle 5 FOLFIRI/Avastin 06/03/2020 CTs 06/15/2020-mild to moderate improvement in hepatic metastasis.  No new or progressive disease.  Narrowing within the proximal ascending colon with upstream mild cecal and terminal ileum dilatation, similar. Cycle 6 FOLFIRI/Avastin 06/17/2020 Cycle 7 FOLFIRI/Avastin 07/01/2020 Cycle 8 FOLFIRI/Avastin 07/15/2020 Cycle 9 FOLFIRI/Avastin 07/29/2020-5-FU bolus eliminated, 5-FU infusion and irinotecan dose reduced Cycle 10 FOLFIRI/Avastin 08/12/2020 CTs 08/23/2020- mild residual wall thickening involving the cecum/ascending colon.  Numerous liver metastases improved. Cycle 11 FOLFIRI/Avastin 08/25/2020 Cycle 12 FOLFIRI/Avastin 09/16/2020 Cycle 13 FOLFIRI/Avastin 10/07/2020 Cycle 14 FOLFIRI/Avastin 10/28/2020 Cycle 15 FOLFIRI/Avastin 11/18/2020 CT abdomen/pelvis 12/06/2020-mild improvement in multifocal hepatic metastases, wall thickening at the ascending colon with pericolonic inflammatory changes Cycle 16 FOLFIRI/Avastin 12/09/2020 Cycle 17 FOLFIRI/Avastin 12/30/2020 Cycle 18 FOLFIRI/Avastin 01/20/2021 Cycle 19 FOLFIRI/Avastin 02/10/2021 Cycle 20 FOLFIRI/Avastin 03/03/2021 Cycle 21 FOLFIRI/Avastin 03/24/2021 Cycle 22 FOLFIRI/Avastin 04/14/2021 CTs 05/04/2021-slight improvement in hepatic metastatic disease. Cycle 23 FOLFIRI/Avastin 05/05/2021 Cycle 24 FOLFIRI/Avastin 05/25/2021 Cycle 25 FOLFIRI/Avastin 06/15/2021 Cycle 26 FOLFIRI/Avastin 07/13/2021 Cycle 27 FOLFIRI/Avastin 08/02/2021 CTs 08/30/2021-majority of liver lesions are unchanged in size, 1 lesion adjacent to the gallbladder has increased, no adenopathy, persistent pericolonic stranding and peritoneal thickening at the cecum Progressive rise in CEA 08/31/2021-treatment changed to Xeloda/bevacizumab, began [place] 09/22/2021 CEA stable 11/03/2021 Xeloda/bevacizumab continued CTs  01/10/2022-increase in size of some of the liver lesions, others are stable, new tiny right lower lobe nodule, increased dilation of distal small bowel with increased wall thickening at the terminal ileum and ileocecal valve with 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, stable bilateral paracolic gutter peritoneal thickening Panitumumab/ Encorafenib 01/12/2022 (encorafenib 01/19/2022) Panitumumab 02/02/2022 Panitumumab 02/20/2022 Panitumumab 03/06/2022 Panitumumab 03/20/2022 Panitumumab 04/03/2022 CTs 04/14/2022-decrease size of liver metastases, new borderline subcarinal adenopathy, improved peritoneal thickening, cirrhosis or pseudocirrhosis, persistent dilated cecum Encorafenib and Panitumumab continued Panitumumab 04/18/2022 Panitumumab 05/05/2022 Panitumumab 05/19/2022 Panitumumab 06/02/2022 Panitumumab 06/16/2022, 4g IV mag and begin oral mag po once daily  Panitumumab 06/30/2022 Panitumumab 07/14/2022, 2 g IV magnesium, increase oral magnesium to 400 mg twice daily Panitumumab  07/28/2022, 2 g IV magnesium CTs 07/28/2022-no change in hepatic metastases, no new signs of metastatic disease, persistent mural thickening at the terminal ileum/cecum mild splenomegaly unchanged Panitumumab 08/18/2022 Panitumumab 09/01/2022 Panitumumab 09/15/2022 Panitumumab 09/29/2022 Panitumumab 10/13/2022 Panitumumab 10/27/2022 CTs 11/03/2022-unchanged hepatic metastases, no new lesions, 3.7 cm soft tissue density at the medial left kidney etiology unclear, improvement in wall thickening at the ileocecal junction development of appendiceal dilatation Panitumumab 11/10/2022 Panitumumab 11/24/2022 Panitumumab 12/08/2022 Panitumumab 12/22/2022 Panitumumab 01/05/2023 Panitumumab 02/02/2023 Panitumumab 02/16/2023 CTs 03/01/2023-unchanged cecal mass. Enlargement of left liver lesion, other liver lesions unchanged. Near complete resolution of soft tissue left renal pelvis. No pericardial effusion. Panitumumab 03/02/2023 Anemia  secondary to GI blood loss, and chemotherapy, on oral iron BID  Red cell transfusion 02/02/2023 Cirrhosis 12/26/2019-hospital admission for right lower extremity DVT and GI bleed, treated with heparin anticoagulation beginning 12/26/2019, converted to Lovenox at discharge 12/29/2019 Lovenox reduced to 40 mg daily 02/10/2021 secondary to bruising and nosebleeding History of low-grade fever-likely "tumor" fever COVID-19 infection January 2021 Admission 01/19/2023 with dyspnea/orthopnea, pericardial effusion, EKG changes suggestive of pericarditis Echocardiogram 01/20/2023-LVEF 60-65%, small to moderate circumferential pericardial effusion without tamponade physiology Echocardiogram 02/19/2022-LVEF 70-75%; trivial pericardial effusion. Anemia secondary to chemotherapy, chronic disease, and phlebotomy 2 units of packed red blood cells 02/03/2023    Disposition: Stacy Burns appears stable. Restaging CTs stable except for an enlarging liver lesion. Results/images reviewed with her at today's visit. Plan to continue current treatment and restage at a 2 month interval. She agrees with this plan.  Labs from 03/01/2023 reviewed, adequate to proceed with treatment.   She will return for f/u as scheduled in 2 weeks.  Patient seen with Dr. Truett Perna.  This was a shared visit with Lonna Cobb.  We reviewed the restaging CT findings and images with Ms Lykens.  The overall findings are consistent with stable disease. She agrees to continue the current therapy with a short interval f/u CT.  I was present for greater than 50% of todays visit.  I performed medical decision making,  Mancel Bale, MD  Lonna Cobb ANP/GNP-BC   03/02/2023  9:09 AM

## 2023-03-03 ENCOUNTER — Encounter: Payer: Self-pay | Admitting: Oncology

## 2023-03-05 ENCOUNTER — Other Ambulatory Visit: Payer: Self-pay

## 2023-03-08 ENCOUNTER — Inpatient Hospital Stay: Payer: BC Managed Care – PPO | Admitting: Oncology

## 2023-03-09 ENCOUNTER — Ambulatory Visit: Payer: BC Managed Care – PPO | Admitting: Cardiology

## 2023-03-09 ENCOUNTER — Other Ambulatory Visit: Payer: Self-pay | Admitting: Cardiology

## 2023-03-09 ENCOUNTER — Encounter: Payer: Self-pay | Admitting: Cardiology

## 2023-03-09 VITALS — BP 128/58 | HR 75 | Resp 16 | Ht 67.0 in | Wt 143.0 lb

## 2023-03-09 DIAGNOSIS — I700 Atherosclerosis of aorta: Secondary | ICD-10-CM | POA: Diagnosis not present

## 2023-03-09 DIAGNOSIS — C189 Malignant neoplasm of colon, unspecified: Secondary | ICD-10-CM

## 2023-03-09 DIAGNOSIS — Z86718 Personal history of other venous thrombosis and embolism: Secondary | ICD-10-CM | POA: Diagnosis not present

## 2023-03-09 DIAGNOSIS — I3139 Other pericardial effusion (noninflammatory): Secondary | ICD-10-CM

## 2023-03-09 DIAGNOSIS — Z87891 Personal history of nicotine dependence: Secondary | ICD-10-CM

## 2023-03-09 MED ORDER — ROSUVASTATIN CALCIUM 5 MG PO TABS
5.0000 mg | ORAL_TABLET | Freq: Every day | ORAL | 0 refills | Status: DC
Start: 2023-03-09 — End: 2023-04-11

## 2023-03-09 NOTE — Progress Notes (Signed)
ID:  Stacy Burns, Stacy Burns [birth year], MRN [id number]  PCP:  Lance Bosch, NP  Cardiologist:  Tessa Lerner, DO, [facility] (established care 01/20/2023)   Chief Complaint  Patient presents with   Pericarditis, unspecified chronicity, unspecified type   Follow-up    HPI  Stacy Burns is a 77 y.o. Caucasian female whose past medical history and cardiovascular risk factors include: Pericardial effusion/acute pericarditis, aortic atherosclerosis (nongated CT study) Hx of DVT 2021, Colon cancer with metastasis dx 2021 on treatment and no prior surgery, anemia, former smoker.   During her hospitalization in June 2024 she was complaining of chest pain and echocardiogram noted pericardial effusion and symptoms concerning for acute pericarditis.  She was started on a combination of indomethacin and colchicine and to reduce risk of bleeding her Lovenox injections were held until she completes the course of indomethacin.  After completing the course of indomethacin limited echocardiogram was performed.  The most recent echocardiogram notes improvement in the severity of her pericardial effusion.  It is now trivial in size, right atrial pressures are also normal, and inflammatory markers back to baseline.  Clinically she is no longer having chest discomfort.  She continues to follow with her oncologist Dr. Thornton Papas and is back on Lovenox for DVT prophylaxis.  Since her last hospitalization she has had received additional 2 units of PRBCs.  Reviewed the results of the carotid duplex which notes no significant ICA stenosis.  The carotid bruit likely secondary from ECA.  She also endorses bilateral shoulder pains and endorses that she does not want to be on Lipitor.  Otherwise denies anginal chest pain or heart failure symptoms.  CARDIAC DATABASE: EKG: February 05, 2023: Sinus rhythm, 80 bpm, right bundle branch block, left axis, left anterior fascicular block, biatrial enlargement, without underlying ischemia  or injury pattern.  Echocardiogram: 01/20/2023:  1. Left ventricular ejection fraction, by estimation, is 60 to 65%. The  left ventricle has normal function. The left ventricle has no regional  wall motion abnormalities. Left ventricular diastolic parameters are  consistent with Grade I diastolic  dysfunction (impaired relaxation).   2. Right ventricular systolic function is normal. The right ventricular  size is normal. Tricuspid regurgitation signal is inadequate for assessing  PA pressure.   3. Left atrial size was mildly dilated.   4. Small to moderate circumferential pericardial effusion without  evidence of tamponade physiology. The IVC is dilated however no sigificant  respiratory variation or diastolic chamber collapse. Conider repeating  echo 48 hours to reassess for any  progression of effusion. . small to moderate. The pericardial effusion is  circumferential. There is no evidence of cardiac tamponade.   5. The mitral valve is normal in structure. No evidence of mitral valve  regurgitation. No evidence of mitral stenosis.   6. The aortic valve is tricuspid. Aortic valve regurgitation is not  visualized. No aortic stenosis is present.   7. The inferior vena cava is dilated in size with <50% respiratory  variability, suggesting right atrial pressure of 15 mmHg.    01/22/2023: Limited echocardiography. LVEF 60-65%. Small to moderate pericardial effusion-no evidence of cardiac tamponade. IVC dilated but compressible, estimated RAP 8 mmHg.  02/20/2023:  1. Left ventricular ejection fraction, by estimation, is 70 to 75%. The  left ventricle has hyperdynamic function. The left ventricle has no  regional wall motion abnormalities. There is mild concentric left  ventricular hypertrophy. Left ventricular  diastolic function could not be evaluated.   2. Right ventricular systolic  function is normal. The right ventricular  size is normal.   3. The mitral valve is normal in  structure.   4. The aortic valve is tricuspid.   5. The inferior vena cava is normal in size with greater than 50%  respiratory variability, suggesting right atrial pressure of 3 mmHg.   6. Trivial pericardial effusion is present. The pericardial effusion is  anterior to the right ventricle.    Carotid artery duplex 02/28/2023:  Duplex suggests stenosis in the right internal carotid artery (1-15%).  Right carotid bulb stenosis of <50% with heterogeneous plaque. <50% stenosis in the right external carotid artery.  Duplex suggests stenosis in the left internal carotid artery (1-15%) with mild homogeneous plaque.  Antegrade right vertebral artery flow. Antegrade left vertebral artery flow.  Follow-up studies if clinically indicated.  External carotid artery stenosis on the right may be the source of bruit.     ALLERGIES: Allergies  Allergen Reactions   Milk-Related Compounds Nausea Only and Other (See Comments)    Delayed reaction if too much consumed    Penicillins Anaphylaxis and Other (See Comments)    Both parents had anaphylactic reactions to this   Pineapple Swelling and Other (See Comments)    Tongue swells and causes acid bumps there, also   Chocolate Other (See Comments)    Nasal congestion   Hydrocodone Nausea And Vomiting   Lipitor [Atorvastatin] Other (See Comments)    Bilateral shoulder aches.   Other Itching, Rash and Other (See Comments)    "Most all antibiotics and OTC cold meds break me out" Allergic to ALL NUTS, also = Itching   Sulfa Antibiotics Rash and Other (See Comments)    Severe rash    MEDICATION LIST PRIOR TO VISIT: Current Meds  Medication Sig   albuterol (VENTOLIN HFA) 108 (90 Base) MCG/ACT inhaler INHALE 2 PUFFS INTO THE LUNGS FOUR TIMES DAILY AS NEEDED   BRAFTOVI 75 MG capsule TAKE 4 CAPSULES ONCE DAILY.   Cholecalciferol (VITAMIN D3) 25 MCG (1000 UT) CAPS Take 1,000 Units by mouth daily.   colchicine 0.6 MG tablet Take 1 tablet (0.6 mg total)  by mouth daily.   enoxaparin (LOVENOX) 40 MG/0.4ML injection Inject 40 mg into the skin daily.   ferrous sulfate (FEROSUL) 325 (65 FE) MG tablet TAKE 1 TABLET(325 MG) BY MOUTH DAILY WITH BREAKFAST   furosemide (LASIX) 20 MG tablet Take 0.5 tablets (10 mg total) by mouth daily.   lidocaine-prilocaine (EMLA) cream Apply 1 Application topically as needed.   magnesium oxide (MAG-OX) 400 (240 Mg) MG tablet Take 1 tablet (400 mg total) by mouth 2 (two) times daily.   oxyCODONE-acetaminophen (PERCOCET/ROXICET) 5-325 MG tablet Take 1 tablet by mouth every 8 (eight) hours as needed for severe pain.   pantoprazole (PROTONIX) 40 MG tablet Take 1 tablet (40 mg total) by mouth 2 (two) times daily.   rosuvastatin (CRESTOR) 5 MG tablet Take 1 tablet (5 mg total) by mouth at bedtime.   [DISCONTINUED] atorvastatin (LIPITOR) 20 MG tablet Take 1 tablet (20 mg total) by mouth at bedtime.     PAST MEDICAL HISTORY: Past Medical History:  Diagnosis Date   colon ca dx'd 12/2019   COVID-19 virus infection 08/2019   not hospitalized.     Severe anemia 12/17/2019    PAST SURGICAL HISTORY: Past Surgical History:  Procedure Laterality Date   BIOPSY  12/18/2019   Procedure: BIOPSY;  Surgeon: Tressia Danas, MD;  Location: [facility] ENDOSCOPY;  Service: Gastroenterology;;   COLONOSCOPY  WITH PROPOFOL N/A 12/18/2019   Procedure: COLONOSCOPY WITH PROPOFOL;  Surgeon: Tressia Danas, MD;  Location: [facility] ENDOSCOPY;  Service: Gastroenterology;  Laterality: N/A;   ESOPHAGOGASTRODUODENOSCOPY (EGD) WITH PROPOFOL N/A 12/18/2019   Procedure: ESOPHAGOGASTRODUODENOSCOPY (EGD) WITH PROPOFOL;  Surgeon: Tressia Danas, MD;  Location: [facility] ENDOSCOPY;  Service: Gastroenterology;  Laterality: N/A;   PORTACATH PLACEMENT N/A 12/19/2019   Procedure: INSERTION PORT-A-CATH WITH ULTRASOUND;  Surgeon: Emelia Loron, MD;  Location: [facility] OR;  Service: General;  Laterality: N/A;   SUBMUCOSAL TATTOO INJECTION  12/18/2019   Procedure: SUBMUCOSAL TATTOO  INJECTION;  Surgeon: Tressia Danas, MD;  Location: [facility] ENDOSCOPY;  Service: Gastroenterology;;   TUBAL LIGATION      FAMILY HISTORY: The patient family history includes Diabetes Mellitus II in her mother; Leukemia in her father; Multiple myeloma in her mother; Muscular dystrophy in her brother.  SOCIAL HISTORY:  The patient  reports that she quit smoking about 38 years ago. Her smoking use included cigarettes. She started smoking about 63 years ago. She has never used smokeless tobacco. She reports that she does not currently use alcohol. She reports that she does not use drugs.  REVIEW OF SYSTEMS: Review of Systems  Cardiovascular:  Negative for chest pain, claudication, dyspnea on exertion, irregular heartbeat, leg swelling, near-syncope, orthopnea, palpitations, paroxysmal nocturnal dyspnea and syncope.  Respiratory:  Negative for shortness of breath.   Hematologic/Lymphatic: Negative for bleeding problem.  Musculoskeletal:  Negative for muscle cramps and myalgias.  Neurological:  Negative for dizziness and light-headedness.    PHYSICAL EXAM:    03/09/2023    3:05 PM 03/02/2023   11:42 AM 03/02/2023    8:59 AM  Vitals with BMI  Height 5\' 7"   5\' 7"   Weight 143 lbs  142 lbs 5 oz  BMI 22.39  22.28  Systolic 128 141 644  Diastolic 58 59 55  Pulse 75 70 75    Physical Exam  Constitutional: No distress.  Age appropriate, hemodynamically stable.   Neck: No JVD present.  Cardiovascular: Normal rate, regular rhythm, S1 normal, S2 normal, intact distal pulses and normal pulses. Exam reveals no gallop, no S3 and no S4.  No murmur heard. Pulses:      Carotid pulses are  on the right side with bruit. Pulmonary/Chest: Effort normal and breath sounds normal. No stridor. She has no wheezes. She has no rales.  Port right anterior chest wall  Abdominal: Soft. Bowel sounds are normal. She exhibits no distension. There is no abdominal tenderness.  Musculoskeletal:        General: No  edema.     Cervical back: Neck supple.  Neurological: She is alert and oriented to person, place, and time. She has intact cranial nerves (2-12).  Skin: Skin is warm and moist.   LABORATORY DATA:    Latest Ref Rng & Units 03/01/2023    2:37 PM 02/16/2023    7:43 AM 02/09/2023   11:41 AM  CBC  WBC 4.0 - 10.5 K/uL 5.4  7.3  8.2   Hemoglobin 12.0 - 15.0 g/dL 9.4  8.5  9.3   Hematocrit 36.0 - 46.0 % 30.8  27.6  29.3   Platelets 150 - 400 K/uL 237  222  219        Latest Ref Rng & Units 03/01/2023    2:37 PM 02/28/2023    3:24 PM 02/16/2023    7:43 AM  CMP  Glucose 70 - 99 mg/dL 83  034  74   BUN 8 - 23  mg/dL 24  25  28    Creatinine 0.44 - 1.00 mg/dL 5.62  1.30  8.65   Sodium 135 - 145 mmol/L 139  140  138   Potassium 3.5 - 5.1 mmol/L 4.4  4.4  4.2   Chloride 98 - 111 mmol/L 107  104  110   CO2 22 - 32 mmol/L 24  23  21    Calcium 8.9 - 10.3 mg/dL 9.0  9.0  8.9   Total Protein 6.5 - 8.1 g/dL 6.9  6.6  6.2   Total Bilirubin 0.3 - 1.2 mg/dL 0.3  <7.8  0.2   Alkaline Phos 38 - 126 U/L 127  171  94   AST 15 - 41 U/L 15  16  13    ALT 0 - 44 U/L 11  11  8      Lab Results  Component Value Date   CHOL 151 01/21/2023   HDL 34 (L) 01/21/2023   LDLCALC 97 01/21/2023   LDLDIRECT 96 01/21/2023   TRIG 99 01/21/2023   CHOLHDL 4.4 01/21/2023   No components found for: "NTPROBNP" No results for input(s): "PROBNP" in the last 8760 hours. No results for input(s): "TSH" in the last 8760 hours.  BMP Recent Labs    02/09/23 1141 02/16/23 0743 02/28/23 1524 03/01/23 1437  NA 139 138 140 139  K 4.1 4.2 4.4 4.4  CL 110 110 104 107  CO2 21* 21* 23 24  GLUCOSE 82 74 103* 83  BUN 35* 28* 25 24*  CREATININE 0.93 0.78 0.89 0.74  CALCIUM 8.8* 8.9 9.0 9.0  GFRNONAA >60 >60  --  >60    HEMOGLOBIN A1C No results found for: "HGBA1C", "MPG"  IMPRESSION:    ICD-10-CM   1. Pericardial effusion  I31.39     2. Atherosclerosis of aorta (HCC)  I70.0 rosuvastatin (CRESTOR) 5 MG tablet    3.  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)  C18.9    C78.7     4. History of DVT (deep vein thrombosis)  Z86.718     5. Former smoker  Z87.891         RECOMMENDATIONS: Stacy Burns is a 77 y.o. Caucasian female whose past medical history and cardiac risk factors include: Pericardial effusion/acute pericarditis, aortic atherosclerosis (nongated CT study) Hx of DVT 2021, Colon cancer with metastasis dx 2021 on treatment and no prior surgery, anemia, former smoker.   Pericardial effusion Pericarditis, unspecified chronicity, unspecified type Diagnosed with small to moderate pericardial effusion/acute pericarditis in June 2024. Completed the dose of indomethacin for 3 weeks. Repeat limited echo notes improvement in pericardial effusion from small / moderate to trivial Inflammatory markers ESR and CRP are now within normal limits. Recommend completing colchicine for total of 3 months -could stop April 25, 2023 Clinically now back to baseline.  No chest pain.  Atherosclerosis of aorta (HCC) Most recent lipid profile reviewed. Patient would like to discontinue Lipitor due to bilateral shoulder aches and pains. Will start on low-dose rosuvastatin 5 mg p.o. nightly.  Bruit of right carotid artery Right carotid bruit noted on physical examination. Carotid duplex does not illustrate any significant ICA disease.  Bruits likely secondary from ECA.  Colon cancer metastasized to liver [facility]) Follows with oncology regularly. She has known chronic anemia given her history of cancer/on anticoagulation given her history of DVT/chemotherapy. Status was discharged from the hospital she has already received 2 units of packed red blood cells.  History of DVT (deep vein thrombosis) Has restarted  Lovenox for DVT prophylaxis.  FINAL MEDICATION LIST END OF ENCOUNTER: Meds ordered this encounter  Medications   rosuvastatin (CRESTOR) 5 MG tablet    Sig: Take 1 tablet (5 mg total) by mouth at bedtime.     Dispense:  30 tablet    Refill:  0    Medications Discontinued During This Encounter  Medication Reason   magic mouthwash SOLN    atorvastatin (LIPITOR) 20 MG tablet      Current Outpatient Medications:    albuterol (VENTOLIN HFA) 108 (90 Base) MCG/ACT inhaler, INHALE 2 PUFFS INTO THE LUNGS FOUR TIMES DAILY AS NEEDED, Disp: 6.7 g, Rfl: 2   BRAFTOVI 75 MG capsule, TAKE 4 CAPSULES ONCE DAILY., Disp: 360 capsule, Rfl: 1   Cholecalciferol (VITAMIN D3) 25 MCG (1000 UT) CAPS, Take 1,000 Units by mouth daily., Disp: , Rfl:    colchicine 0.6 MG tablet, Take 1 tablet (0.6 mg total) by mouth daily., Disp: 90 tablet, Rfl: 0   enoxaparin (LOVENOX) 40 MG/0.4ML injection, Inject 40 mg into the skin daily., Disp: , Rfl:    ferrous sulfate (FEROSUL) 325 (65 FE) MG tablet, TAKE 1 TABLET(325 MG) BY MOUTH DAILY WITH BREAKFAST, Disp: 90 tablet, Rfl: 1   furosemide (LASIX) 20 MG tablet, Take 0.5 tablets (10 mg total) by mouth daily., Disp: 30 tablet, Rfl: 0   lidocaine-prilocaine (EMLA) cream, Apply 1 Application topically as needed., Disp: 30 g, Rfl: 0   magnesium oxide (MAG-OX) 400 (240 Mg) MG tablet, Take 1 tablet (400 mg total) by mouth 2 (two) times daily., Disp: 60 tablet, Rfl: 2   oxyCODONE-acetaminophen (PERCOCET/ROXICET) 5-325 MG tablet, Take 1 tablet by mouth every 8 (eight) hours as needed for severe pain., Disp: 60 tablet, Rfl: 0   pantoprazole (PROTONIX) 40 MG tablet, Take 1 tablet (40 mg total) by mouth 2 (two) times daily., Disp: 60 tablet, Rfl: 0   rosuvastatin (CRESTOR) 5 MG tablet, Take 1 tablet (5 mg total) by mouth at bedtime., Disp: 30 tablet, Rfl: 0 No current facility-administered medications for this visit.  Facility-Administered Medications Ordered in Other Visits:    pegfilgrastim-cbqv (UDENYCA) injection 6 mg, 6 mg, Subcutaneous, Once, Thornton Papas B, MD   sodium chloride flush (NS) 0.9 % injection 10 mL, 10 mL, Intravenous, PRN, Ladene Artist, MD, 10 mL at 12/22/21  1100  No orders of the defined types were placed in this encounter.   There are no Patient Instructions on file for this visit.   --Continue cardiac medications as reconciled in final medication list. --Return in about 6 months (around 09/09/2023) for hx of pericarditis and aortic atherosclerosis. or sooner if needed. --Continue follow-up with your primary care physician regarding the management of your other chronic comorbid conditions.  Patient's questions and concerns were addressed to her satisfaction. She voices understanding of the instructions provided during this encounter.   This note was created using a voice recognition software as a result there may be grammatical errors inadvertently enclosed that do not reflect the nature of this encounter. Every attempt is made to correct such errors.  Tessa Lerner, [place], [facility]  Pager:  [phone] Office: [phone]

## 2023-03-11 ENCOUNTER — Other Ambulatory Visit: Payer: Self-pay

## 2023-03-13 ENCOUNTER — Other Ambulatory Visit: Payer: BC Managed Care – PPO

## 2023-03-13 ENCOUNTER — Ambulatory Visit: Payer: BC Managed Care – PPO | Admitting: Oncology

## 2023-03-13 ENCOUNTER — Ambulatory Visit: Payer: BC Managed Care – PPO

## 2023-03-16 ENCOUNTER — Inpatient Hospital Stay: Payer: BC Managed Care – PPO | Admitting: Nurse Practitioner

## 2023-03-16 ENCOUNTER — Inpatient Hospital Stay: Payer: BC Managed Care – PPO

## 2023-03-16 ENCOUNTER — Encounter: Payer: Self-pay | Admitting: Nurse Practitioner

## 2023-03-16 ENCOUNTER — Inpatient Hospital Stay: Payer: BC Managed Care – PPO | Attending: Oncology

## 2023-03-16 VITALS — BP 129/56 | HR 76

## 2023-03-16 DIAGNOSIS — K7460 Unspecified cirrhosis of liver: Secondary | ICD-10-CM | POA: Insufficient documentation

## 2023-03-16 DIAGNOSIS — Z5112 Encounter for antineoplastic immunotherapy: Secondary | ICD-10-CM | POA: Insufficient documentation

## 2023-03-16 DIAGNOSIS — C182 Malignant neoplasm of ascending colon: Secondary | ICD-10-CM

## 2023-03-16 DIAGNOSIS — C787 Secondary malignant neoplasm of liver and intrahepatic bile duct: Secondary | ICD-10-CM | POA: Insufficient documentation

## 2023-03-16 DIAGNOSIS — D6481 Anemia due to antineoplastic chemotherapy: Secondary | ICD-10-CM | POA: Diagnosis not present

## 2023-03-16 LAB — CMP (CANCER CENTER ONLY)
ALT: 7 U/L (ref 0–44)
AST: 13 U/L — ABNORMAL LOW (ref 15–41)
Albumin: 3.4 g/dL — ABNORMAL LOW (ref 3.5–5.0)
Alkaline Phosphatase: 99 U/L (ref 38–126)
Anion gap: 7 (ref 5–15)
BUN: 30 mg/dL — ABNORMAL HIGH (ref 8–23)
CO2: 24 mmol/L (ref 22–32)
Calcium: 8.6 mg/dL — ABNORMAL LOW (ref 8.9–10.3)
Chloride: 108 mmol/L (ref 98–111)
Creatinine: 0.78 mg/dL (ref 0.44–1.00)
GFR, Estimated: >60 mL/min (ref >60)
Glucose, Bld: 107 mg/dL — ABNORMAL HIGH (ref 70–99)
Potassium: 4.3 mmol/L (ref 3.5–5.1)
Sodium: 139 mmol/L (ref 135–145)
Total Bilirubin: 0.3 mg/dL (ref 0.3–1.2)
Total Protein: 6.1 g/dL — ABNORMAL LOW (ref 6.5–8.1)

## 2023-03-16 LAB — CBC WITH DIFFERENTIAL (CANCER CENTER ONLY)
Abs Immature Granulocytes: 0.03 10*3/uL (ref 0.00–0.07)
Basophils Absolute: 0 10*3/uL (ref 0.0–0.1)
Basophils Relative: 1 %
Eosinophils Absolute: 0.2 10*3/uL (ref 0.0–0.5)
Eosinophils Relative: 3 %
HCT: 30.3 % — ABNORMAL LOW (ref 36.0–46.0)
Hemoglobin: 9.4 g/dL — ABNORMAL LOW (ref 12.0–15.0)
Immature Granulocytes: 1 %
Lymphocytes Relative: 17 %
Lymphs Abs: 1 10*3/uL (ref 0.7–4.0)
MCH: 27 pg (ref 26.0–34.0)
MCHC: 31 g/dL (ref 30.0–36.0)
MCV: 87.1 fL (ref 80.0–100.0)
Monocytes Absolute: 0.7 10*3/uL (ref 0.1–1.0)
Monocytes Relative: 11 %
Neutro Abs: 3.9 10*3/uL (ref 1.7–7.7)
Neutrophils Relative %: 67 %
Platelet Count: 175 10*3/uL (ref 150–400)
RBC: 3.48 MIL/uL — ABNORMAL LOW (ref 3.87–5.11)
RDW: 16.7 % — ABNORMAL HIGH (ref 11.5–15.5)
WBC Count: 5.8 10*3/uL (ref 4.0–10.5)
nRBC: 0 % (ref 0.0–0.2)

## 2023-03-16 LAB — CEA (ACCESS): CEA (CHCC): 73.1 ng/mL — ABNORMAL HIGH (ref 0.00–5.00)

## 2023-03-16 LAB — MAGNESIUM: Magnesium: 1.7 mg/dL (ref 1.7–2.4)

## 2023-03-16 MED ORDER — SODIUM CHLORIDE 0.9% FLUSH
10.0000 mL | INTRAVENOUS | Status: DC | PRN
  Administered 2023-03-16: 10 mL

## 2023-03-16 MED ORDER — SODIUM CHLORIDE 0.9 % IV SOLN: Freq: Once | INTRAVENOUS | Status: AC

## 2023-03-16 MED ORDER — SODIUM CHLORIDE 0.9 % IV SOLN
6.0000 mg/kg | Freq: Once | INTRAVENOUS | Status: AC
  Administered 2023-03-16: 400 mg via INTRAVENOUS
  Filled 2023-03-16: qty 20

## 2023-03-16 MED ORDER — HEPARIN SOD (PORK) LOCK FLUSH 100 UNIT/ML IV SOLN
500.0000 [IU] | Freq: Once | INTRAVENOUS | Status: AC | PRN
  Administered 2023-03-16: 500 [IU]

## 2023-03-16 NOTE — Progress Notes (Signed)
Stacy Burns OFFICE PROGRESS NOTE   Diagnosis: Colon cancer  INTERVAL HISTORY:   Stacy Burns returns as scheduled.  She continues encorafenib and Panitumumab.  Overall feeling well.  No nausea or vomiting.  No mouth sores.  No diarrhea.  No rash.  She denies abdominal pain.  No shortness of breath or chest pain.  Appetite is good.  Occasional nosebleed.  Objective:  Vital signs in last 24 hours:  Blood pressure (!) 116/56, pulse 81, temperature 98.2 F (36.8 C), temperature source Oral, resp. rate 18, height 5\' 7"  (1.702 m), weight 142 lb (64.4 kg), SpO2 100%.    HEENT: No thrush or ulcers. Resp: Lungs clear bilaterally. Cardio: Regular rate and rhythm. GI: No hepatosplenomegaly. Vascular: No leg edema. Neuro: Alert and oriented. Skin: Ecchymoses scattered over forearms. Port-A-Cath without erythema.  Lab Results:  Lab Results  Component Value Date   WBC 5.8 03/16/2023   HGB 9.4 (L) 03/16/2023   HCT 30.3 (L) 03/16/2023   MCV 87.1 03/16/2023   PLT 175 03/16/2023   NEUTROABS 3.9 03/16/2023    Imaging:  No results found.  Medications: I have reviewed the patient's current medications.  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  Cirrhosis.  Innumerable liver lesions. Colonoscopy 12/18/2019-ulcerated partially obstructing large mass in the mid ascending colon.  Biopsy invasive adenocarcinoma; preserved expression major MMR proteins Upper endoscopy 12/18/2019-normal esophagus, stomach, examined duodenum CEA 12/19/2019-8,923 Biopsy liver lesion 12/23/2019-adenocarcinoma Foundation 1-K-ras wild-type; tumor mutation burden greater than or equal to 10; microsatellite stable; BRAF V600E Cycle 1 FOLFOX 01/01/2020 Cycle 2 FOLFOX 01/15/2020  Cycle 3 FOLFOX 01/29/2020, Udenyca added Cycle 4 FOLFOX 02/12/2020, Udenyca held Cycle 5 FOLFOX 02/26/2020,  Udenyca Cycle 6 FOLFOX 03/11/2020, Udenyca held CT abdomen/pelvis 03/16/2020-stable diffuse liver lesions, stable strictured appearing ascending colon, possible pericolonic implant, vague left lower lobe nodule Cycle 1 FOLFIRI/Avastin 04/06/2020 Cycle 2 FOLFIRI/Avastin 04/21/2020 Cycle 3 FOLFIRI/Avastin 05/06/2020 Cycle 4 FOLFIRI/Avastin 05/20/2020 Cycle 5 FOLFIRI/Avastin 06/03/2020 CTs 06/15/2020-mild to moderate improvement in hepatic metastasis.  No new or progressive disease.  Narrowing within the proximal ascending colon with upstream mild cecal and terminal ileum dilatation, similar. Cycle 6 FOLFIRI/Avastin 06/17/2020 Cycle 7 FOLFIRI/Avastin 07/01/2020 Cycle 8 FOLFIRI/Avastin 07/15/2020 Cycle 9 FOLFIRI/Avastin 07/29/2020-5-FU bolus eliminated, 5-FU infusion and irinotecan dose reduced Cycle 10 FOLFIRI/Avastin 08/12/2020 CTs 08/23/2020- mild residual wall thickening involving the cecum/ascending colon.  Numerous liver metastases improved. Cycle 11 FOLFIRI/Avastin 08/25/2020 Cycle 12 FOLFIRI/Avastin 09/16/2020 Cycle 13 FOLFIRI/Avastin 10/07/2020 Cycle 14 FOLFIRI/Avastin 10/28/2020 Cycle 15 FOLFIRI/Avastin 11/18/2020 CT abdomen/pelvis 12/06/2020-mild improvement in multifocal hepatic metastases, wall thickening at the ascending colon with pericolonic inflammatory changes Cycle 16 FOLFIRI/Avastin 12/09/2020 Cycle 17 FOLFIRI/Avastin 12/30/2020 Cycle 18 FOLFIRI/Avastin 01/20/2021 Cycle 19 FOLFIRI/Avastin 02/10/2021 Cycle 20 FOLFIRI/Avastin 03/03/2021 Cycle 21 FOLFIRI/Avastin 03/24/2021 Cycle 22 FOLFIRI/Avastin 04/14/2021 CTs 05/04/2021-slight improvement in hepatic metastatic disease. Cycle 23 FOLFIRI/Avastin 05/05/2021 Cycle 24 FOLFIRI/Avastin 05/25/2021 Cycle 25 FOLFIRI/Avastin 06/15/2021 Cycle 26 FOLFIRI/Avastin 07/13/2021 Cycle 27 FOLFIRI/Avastin 08/02/2021 CTs 08/30/2021-majority of liver lesions are unchanged in size, 1 lesion adjacent to the gallbladder has increased, no adenopathy, persistent pericolonic  stranding and peritoneal thickening at the cecum Progressive rise in CEA 08/31/2021-treatment changed to Xeloda/bevacizumab, began [place] 09/22/2021 CEA stable 11/03/2021 Xeloda/bevacizumab continued CTs 01/10/2022-increase in size of some of the liver lesions, others are stable, new tiny right lower lobe nodule, increased dilation of distal small bowel with increased wall thickening at the terminal ileum and ileocecal valve with chronic stricture  in the proximal ascending colon, stable bilateral paracolic gutter peritoneal thickening Panitumumab/ Encorafenib 01/12/2022 (encorafenib 01/19/2022) Panitumumab 02/02/2022 Panitumumab 02/20/2022 Panitumumab 03/06/2022 Panitumumab 03/20/2022 Panitumumab 04/03/2022 CTs 04/14/2022-decrease size of liver metastases, new borderline subcarinal adenopathy, improved peritoneal thickening, cirrhosis or pseudocirrhosis, persistent dilated cecum Encorafenib and Panitumumab continued Panitumumab 04/18/2022 Panitumumab 05/05/2022 Panitumumab 05/19/2022 Panitumumab 06/02/2022 Panitumumab 06/16/2022, 4g IV mag and begin oral mag po once daily  Panitumumab 06/30/2022 Panitumumab 07/14/2022, 2 g IV magnesium, increase oral magnesium to 400 mg twice daily Panitumumab 07/28/2022, 2 g IV magnesium CTs 07/28/2022-no change in hepatic metastases, no new signs of metastatic disease, persistent mural thickening at the terminal ileum/cecum mild splenomegaly unchanged Panitumumab 08/18/2022 Panitumumab 09/01/2022 Panitumumab 09/15/2022 Panitumumab 09/29/2022 Panitumumab 10/13/2022 Panitumumab 10/27/2022 CTs 11/03/2022-unchanged hepatic metastases, no new lesions, 3.7 cm soft tissue density at the medial left kidney etiology unclear, improvement in wall thickening at the ileocecal junction development of appendiceal dilatation Panitumumab 11/10/2022 Panitumumab 11/24/2022 Panitumumab 12/08/2022 Panitumumab 12/22/2022 Panitumumab 01/05/2023 Panitumumab 02/02/2023 Panitumumab 02/16/2023 CTs  03/01/2023-unchanged cecal mass. Enlargement of left liver lesion, other liver lesions unchanged. Near complete resolution of soft tissue left renal pelvis. No pericardial effusion. Panitumumab 03/02/2023 Panitumumab 03/16/2023 Anemia secondary to GI blood loss, and chemotherapy, on oral iron BID  Red cell transfusion 02/02/2023 Cirrhosis 12/26/2019-hospital admission for right lower extremity DVT and GI bleed, treated with heparin anticoagulation beginning 12/26/2019, converted to Lovenox at discharge 12/29/2019 Lovenox reduced to 40 mg daily 02/10/2021 secondary to bruising and nosebleeding History of low-grade fever-likely "tumor" fever COVID-19 infection January 2021 Admission 01/19/2023 with dyspnea/orthopnea, pericardial effusion, EKG changes suggestive of pericarditis Echocardiogram 01/20/2023-LVEF 60-65%, small to moderate circumferential pericardial effusion without tamponade physiology Echocardiogram 02/19/2022-LVEF 70-75%; trivial pericardial effusion. Anemia secondary to chemotherapy, chronic disease, and phlebotomy 2 units of packed red blood cells 02/03/2023  Disposition: Stacy Burns appears stable.  There is no clinical evidence of disease progression.  Plan to continue encorafenib and Panitumumab as she is currently taking.  CBC and chemistry panel reviewed.  Labs adequate to proceed with Panitumumab today as scheduled.  She will return for follow-up and treatment in 2 weeks.    Lonna Cobb ANP/GNP-BC   03/16/2023  9:54 AM

## 2023-03-16 NOTE — Patient Instructions (Signed)
[facility]   Discharge Instructions: Thank you for choosing [facility] to provide your oncology and hematology care.   If you have a lab appointment with the Cancer Center, please go directly to the Cancer Center and check in at the registration area.   Wear comfortable clothing and clothing appropriate for easy access to any Portacath or PICC line.   We strive to give you quality time with your provider. You may need to reschedule your appointment if you arrive late (15 or more minutes).  Arriving late affects you and other patients whose appointments are after yours.  Also, if you miss three or more appointments without notifying the office, you may be dismissed from the clinic at the provider's discretion.      For prescription refill requests, have your pharmacy contact our office and allow 72 hours for refills to be completed.    Today you received the following chemotherapy and/or immunotherapy agents Vectibix      To help prevent nausea and vomiting after your treatment, we encourage you to take your nausea medication as directed.  BELOW ARE SYMPTOMS THAT SHOULD BE REPORTED IMMEDIATELY: *FEVER GREATER THAN 100.4 F (38 C) OR HIGHER *CHILLS OR SWEATING *NAUSEA AND VOMITING THAT IS NOT CONTROLLED WITH YOUR NAUSEA MEDICATION *UNUSUAL SHORTNESS OF BREATH *UNUSUAL BRUISING OR BLEEDING *URINARY PROBLEMS (pain or burning when urinating, or frequent urination) *BOWEL PROBLEMS (unusual diarrhea, constipation, pain near the anus) TENDERNESS IN MOUTH AND THROAT WITH OR WITHOUT PRESENCE OF ULCERS (sore throat, sores in mouth, or a toothache) UNUSUAL RASH, SWELLING OR PAIN  UNUSUAL VAGINAL DISCHARGE OR ITCHING   Items with * indicate a potential emergency and should be followed up as soon as possible or go to the Emergency Department if any problems should occur.  Please show the CHEMOTHERAPY ALERT CARD or IMMUNOTHERAPY ALERT CARD at  check-in to the Emergency Department and triage nurse.  Should you have questions after your visit or need to cancel or reschedule your appointment, please contact [facility]  Dept: [phone]  and follow the prompts.  Office hours are 8:00 a.m. to 4:30 p.m. Monday - Friday. Please note that voicemails left after 4:00 p.m. may not be returned until the following business day.  We are closed weekends and major holidays. You have access to a nurse at all times for urgent questions. Please call the main number to the clinic Dept: [phone] and follow the prompts.   For any non-urgent questions, you may also contact your provider using MyChart. We now offer e-Visits for anyone 42 and older to request care online for non-urgent symptoms. For details visit [email or web address].   Also download the MyChart app! Go to the app store, search "MyChart", open the app, select [facility], and log in with your MyChart username and password.  Panitumumab Injection What is this medication? PANITUMUMAB (pan i TOOM ue mab) treats colorectal cancer. It works by blocking a protein that causes cancer cells to grow and multiply. This helps to slow or stop the spread of cancer cells. It is a monoclonal antibody. This medicine may be used for other purposes; ask your health care provider or pharmacist if you have questions. COMMON BRAND NAME(S): Vectibix What should I tell my care team before I take this medication? They need to know if you have any of these conditions: Eye disease Low levels of magnesium in the blood Lung disease An unusual or allergic reaction  to panitumumab, other medications, foods, dyes, or preservatives Pregnant or trying to get pregnant Breast-feeding How should I use this medication? This medication is injected into a vein. It is given by your care team in a hospital or clinic setting. Talk to your care team about the use of this medication in  children. Special care may be needed. Overdosage: If you think you have taken too much of this medicine contact a poison control center or emergency room at once. NOTE: This medicine is only for you. Do not share this medicine with others. What if I miss a dose? Keep appointments for follow-up doses. It is important not to miss your dose. Call your care team if you are unable to keep an appointment. What may interact with this medication? Bevacizumab This list may not describe all possible interactions. Give your health care provider a list of all the medicines, herbs, non-prescription drugs, or dietary supplements you use. Also tell them if you smoke, drink alcohol, or use illegal drugs. Some items may interact with your medicine. What should I watch for while using this medication? Your condition will be monitored carefully while you are receiving this medication. This medication may make you feel generally unwell. This is not uncommon as chemotherapy can affect healthy cells as well as cancer cells. Report any side effects. Continue your course of treatment even though you feel ill unless your care team tells you to stop. This medication can make you more sensitive to the sun. Keep out of the sun while receiving this medication and for 2 months after stopping therapy. If you cannot avoid being in the sun, wear protective clothing and sunscreen. Do not use sun lamps, tanning beds, or tanning booths. Check with your care team if you have severe diarrhea, nausea, and vomiting or if you sweat a lot. The loss of too much body fluid may make it dangerous for you to take this medication. This medication may cause serious skin reactions. They can happen weeks to months after starting the medication. Contact your care team right away if you notice fevers or flu-like symptoms with a rash. The rash may be red or purple and then turn into blisters or peeling of the skin. You may also notice a red rash with  swelling of the face, lips, or lymph nodes in your neck or under your arms. Talk to your care team if you may be pregnant. Serious birth defects can occur if you take this medication during pregnancy and for 2 months after the last dose. Contraception is recommended while taking this medication and for 2 months after the last dose. Your care team can help you find the option that works for you. Do not breastfeed while taking this medication and for 2 months after the last dose. This medication may cause infertility. Talk to your care team if you are concerned about your fertility. What side effects may I notice from receiving this medication? Side effects that you should report to your care team as soon as possible: Allergic reactions--skin rash, itching, hives, swelling of the face, lips, tongue, or throat Dry cough, shortness of breath or trouble breathing Eye pain, redness, irritation, or discharge with blurry or decreased vision Infusion reactions--chest pain, shortness of breath or trouble breathing, feeling faint or lightheaded Low magnesium level--muscle pain or cramps, unusual weakness or fatigue, fast or irregular heartbeat, tremors Low potassium level--muscle pain or cramps, unusual weakness or fatigue, fast or irregular heartbeat, constipation Redness, blistering, peeling, or loosening of   the skin, including inside the mouth Skin reactions on sun-exposed areas Side effects that usually do not require medical attention (report to your care team if they continue or are bothersome): Change in nail shape, thickness, or color Diarrhea Dry skin Fatigue Nausea Vomiting This list may not describe all possible side effects. Call your doctor for medical advice about side effects. You may report side effects to FDA at 1-800-FDA-1088. Where should I keep my medication? This medication is given in a hospital or clinic. It will not be stored at home. NOTE: This sheet is a summary. It may not  cover all possible information. If you have questions about this medicine, talk to your doctor, pharmacist, or health care provider.  2024 Elsevier/Gold Standard (2021-12-14 00:00:00)

## 2023-03-16 NOTE — Progress Notes (Signed)
Patient seen by Lisa Thomas NP today  Vitals are within treatment parameters.  Labs reviewed by Lisa Thomas NP and are within treatment parameters.  Per physician team, patient is ready for treatment and there are NO modifications to the treatment plan.     

## 2023-03-22 ENCOUNTER — Other Ambulatory Visit: Payer: Self-pay | Admitting: Oncology

## 2023-03-22 DIAGNOSIS — C182 Malignant neoplasm of ascending colon: Secondary | ICD-10-CM

## 2023-03-30 ENCOUNTER — Encounter: Payer: Self-pay | Admitting: *Deleted

## 2023-03-30 ENCOUNTER — Inpatient Hospital Stay: Payer: BC Managed Care – PPO

## 2023-03-30 ENCOUNTER — Encounter: Payer: Self-pay | Admitting: Oncology

## 2023-03-30 ENCOUNTER — Inpatient Hospital Stay: Payer: BC Managed Care – PPO | Admitting: Oncology

## 2023-03-30 VITALS — BP 131/64 | HR 76 | Temp 97.9°F | Resp 18 | Ht 67.0 in | Wt 143.6 lb

## 2023-03-30 VITALS — BP 137/56 | HR 67

## 2023-03-30 DIAGNOSIS — C182 Malignant neoplasm of ascending colon: Secondary | ICD-10-CM

## 2023-03-30 DIAGNOSIS — K7460 Unspecified cirrhosis of liver: Secondary | ICD-10-CM | POA: Diagnosis not present

## 2023-03-30 DIAGNOSIS — D6481 Anemia due to antineoplastic chemotherapy: Secondary | ICD-10-CM | POA: Diagnosis not present

## 2023-03-30 DIAGNOSIS — Z5112 Encounter for antineoplastic immunotherapy: Secondary | ICD-10-CM | POA: Diagnosis not present

## 2023-03-30 DIAGNOSIS — C787 Secondary malignant neoplasm of liver and intrahepatic bile duct: Secondary | ICD-10-CM | POA: Diagnosis not present

## 2023-03-30 LAB — CMP (CANCER CENTER ONLY)
ALT: 9 U/L (ref 0–44)
AST: 15 U/L (ref 15–41)
Albumin: 3.5 g/dL (ref 3.5–5.0)
Alkaline Phosphatase: 101 U/L (ref 38–126)
Anion gap: 7 (ref 5–15)
BUN: 20 mg/dL (ref 8–23)
CO2: 25 mmol/L (ref 22–32)
Calcium: 8.8 mg/dL — ABNORMAL LOW (ref 8.9–10.3)
Chloride: 107 mmol/L (ref 98–111)
Creatinine: 0.75 mg/dL (ref 0.44–1.00)
GFR, Estimated: >60 mL/min (ref >60)
Glucose, Bld: 85 mg/dL (ref 70–99)
Potassium: 4.2 mmol/L (ref 3.5–5.1)
Sodium: 139 mmol/L (ref 135–145)
Total Bilirubin: 0.4 mg/dL (ref 0.3–1.2)
Total Protein: 6.5 g/dL (ref 6.5–8.1)

## 2023-03-30 LAB — MAGNESIUM: Magnesium: 1.6 mg/dL — ABNORMAL LOW (ref 1.7–2.4)

## 2023-03-30 LAB — CBC WITH DIFFERENTIAL (CANCER CENTER ONLY)
Abs Immature Granulocytes: 0.01 10*3/uL (ref 0.00–0.07)
Basophils Absolute: 0 10*3/uL (ref 0.0–0.1)
Basophils Relative: 1 %
Eosinophils Absolute: 0.2 10*3/uL (ref 0.0–0.5)
Eosinophils Relative: 3 %
HCT: 32.7 % — ABNORMAL LOW (ref 36.0–46.0)
Hemoglobin: 10.3 g/dL — ABNORMAL LOW (ref 12.0–15.0)
Immature Granulocytes: 0 %
Lymphocytes Relative: 19 %
Lymphs Abs: 1 10*3/uL (ref 0.7–4.0)
MCH: 27.4 pg (ref 26.0–34.0)
MCHC: 31.5 g/dL (ref 30.0–36.0)
MCV: 87 fL (ref 80.0–100.0)
Monocytes Absolute: 0.6 10*3/uL (ref 0.1–1.0)
Monocytes Relative: 13 %
Neutro Abs: 3.2 10*3/uL (ref 1.7–7.7)
Neutrophils Relative %: 64 %
Platelet Count: 183 10*3/uL (ref 150–400)
RBC: 3.76 MIL/uL — ABNORMAL LOW (ref 3.87–5.11)
RDW: 16.8 % — ABNORMAL HIGH (ref 11.5–15.5)
WBC Count: 5 10*3/uL (ref 4.0–10.5)
nRBC: 0 % (ref 0.0–0.2)

## 2023-03-30 LAB — CEA (ACCESS): CEA (CHCC): 100.15 ng/mL — ABNORMAL HIGH (ref 0.00–5.00)

## 2023-03-30 MED ORDER — SODIUM CHLORIDE 0.9 % IV SOLN: Freq: Once | INTRAVENOUS | Status: AC

## 2023-03-30 MED ORDER — SODIUM CHLORIDE 0.9 % IV SOLN
6.0000 mg/kg | Freq: Once | INTRAVENOUS | Status: AC
  Administered 2023-03-30: 400 mg via INTRAVENOUS
  Filled 2023-03-30: qty 20

## 2023-03-30 MED ORDER — OXYCODONE-ACETAMINOPHEN 5-325 MG PO TABS: 1.0000 | ORAL_TABLET | Freq: Three times a day (TID) | ORAL | 0 refills | Status: DC | PRN

## 2023-03-30 MED ORDER — SODIUM CHLORIDE 0.9% FLUSH
10.0000 mL | INTRAVENOUS | Status: DC | PRN
  Administered 2023-03-30: 10 mL

## 2023-03-30 MED ORDER — HEPARIN SOD (PORK) LOCK FLUSH 100 UNIT/ML IV SOLN
500.0000 [IU] | Freq: Once | INTRAVENOUS | Status: AC | PRN
  Administered 2023-03-30: 500 [IU]

## 2023-03-30 NOTE — Progress Notes (Signed)
[facility] OFFICE PROGRESS NOTE   Diagnosis: Colon cancer  INTERVAL HISTORY:   Stacy Burns returns as scheduled.  She continues encorafenib and panitumumab.  No new complaint.  No diarrhea or bleeding.  Good appetite. She saw cardiology 03/09/2023.  A repeat echocardiogram revealed a trivial pericardial effusion.  She continues colchicine until September.  She denies chest pain.  Objective:  Vital signs in last 24 hours:  Blood pressure 131/64, pulse 76, temperature 97.9 F (36.6 C), resp. rate 18, height 5\' 7"  (1.702 m), weight 143 lb 9.6 oz (65.1 kg), SpO2 100%.    HEENT: No thrush or ulcers Resp: Lungs clear bilaterally Cardio: Regular rate and rhythm GI: Nontender, no hepatomegaly, firm fullness on deep palpation in the lateral right lower abdomen Vascular: No leg edema  Skin: Mild acne type rash over the forehead  Portacath/PICC-without erythema  Lab Results:  Lab Results  Component Value Date   WBC 5.0 03/30/2023   HGB 10.3 (L) 03/30/2023   HCT 32.7 (L) 03/30/2023   MCV 87.0 03/30/2023   PLT 183 03/30/2023   NEUTROABS 3.2 03/30/2023    CMP  Lab Results  Component Value Date   NA 139 03/16/2023   K 4.3 03/16/2023   CL 108 03/16/2023   CO2 24 03/16/2023   GLUCOSE 107 (H) 03/16/2023   BUN 30 (H) 03/16/2023   CREATININE 0.78 03/16/2023   CALCIUM 8.6 (L) 03/16/2023   PROT 6.1 (L) 03/16/2023   ALBUMIN 3.4 (L) 03/16/2023   AST 13 (L) 03/16/2023   ALT 7 03/16/2023   ALKPHOS 99 03/16/2023   BILITOT 0.3 03/16/2023   GFRNONAA >60 03/16/2023   GFRAA >60 05/06/2020    Lab Results  Component Value Date   CEA1 54.57 (H) 12/30/2020   CEA 73.10 (H) 03/16/2023     Medications: I have reviewed the patient's current medications.   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  Cirrhosis.  Innumerable liver lesions. Colonoscopy  12/18/2019-ulcerated partially obstructing large mass in the mid ascending colon.  Biopsy invasive adenocarcinoma; preserved expression major MMR proteins Upper endoscopy 12/18/2019-normal esophagus, stomach, examined duodenum CEA 12/19/2019-8,923 Biopsy liver lesion 12/23/2019-adenocarcinoma Foundation 1-K-ras wild-type; tumor mutation burden greater than or equal to 10; microsatellite stable; BRAF V600E Cycle 1 FOLFOX 01/01/2020 Cycle 2 FOLFOX 01/15/2020  Cycle 3 FOLFOX 01/29/2020, Udenyca added Cycle 4 FOLFOX 02/12/2020, Udenyca held Cycle 5 FOLFOX 02/26/2020, Udenyca Cycle 6 FOLFOX 03/11/2020, Udenyca held CT abdomen/pelvis 03/16/2020-stable diffuse liver lesions, stable strictured appearing ascending colon, possible pericolonic implant, vague left lower lobe nodule Cycle 1 FOLFIRI/Avastin 04/06/2020 Cycle 2 FOLFIRI/Avastin 04/21/2020 Cycle 3 FOLFIRI/Avastin 05/06/2020 Cycle 4 FOLFIRI/Avastin 05/20/2020 Cycle 5 FOLFIRI/Avastin 06/03/2020 CTs 06/15/2020-mild to moderate improvement in hepatic metastasis.  No new or progressive disease.  Narrowing within the proximal ascending colon with upstream mild cecal and terminal ileum dilatation, similar. Cycle 6 FOLFIRI/Avastin 06/17/2020 Cycle 7 FOLFIRI/Avastin 07/01/2020 Cycle 8 FOLFIRI/Avastin 07/15/2020 Cycle 9 FOLFIRI/Avastin 07/29/2020-5-FU bolus eliminated, 5-FU infusion and irinotecan dose reduced Cycle 10 FOLFIRI/Avastin 08/12/2020 CTs 08/23/2020- mild residual wall thickening involving the cecum/ascending colon.  Numerous liver metastases improved. Cycle 11 FOLFIRI/Avastin 08/25/2020 Cycle 12 FOLFIRI/Avastin 09/16/2020 Cycle 13 FOLFIRI/Avastin 10/07/2020 Cycle 14 FOLFIRI/Avastin 10/28/2020 Cycle 15 FOLFIRI/Avastin 11/18/2020 CT abdomen/pelvis 12/06/2020-mild improvement in multifocal hepatic metastases, wall thickening at the ascending colon with pericolonic inflammatory changes Cycle 16 FOLFIRI/Avastin 12/09/2020 Cycle 17 FOLFIRI/Avastin 12/30/2020 Cycle 18  FOLFIRI/Avastin 01/20/2021 Cycle 19 FOLFIRI/Avastin 02/10/2021 Cycle  20 FOLFIRI/Avastin 03/03/2021 Cycle 21 FOLFIRI/Avastin 03/24/2021 Cycle 22 FOLFIRI/Avastin 04/14/2021 CTs 05/04/2021-slight improvement in hepatic metastatic disease. Cycle 23 FOLFIRI/Avastin 05/05/2021 Cycle 24 FOLFIRI/Avastin 05/25/2021 Cycle 25 FOLFIRI/Avastin 06/15/2021 Cycle 26 FOLFIRI/Avastin 07/13/2021 Cycle 27 FOLFIRI/Avastin 08/02/2021 CTs 08/30/2021-majority of liver lesions are unchanged in size, 1 lesion adjacent to the gallbladder has increased, no adenopathy, persistent pericolonic stranding and peritoneal thickening at the cecum Progressive rise in CEA 08/31/2021-treatment changed to Xeloda/bevacizumab, began [place] 09/22/2021 CEA stable 11/03/2021 Xeloda/bevacizumab continued CTs 01/10/2022-increase in size of some of the liver lesions, others are stable, new tiny right lower lobe nodule, increased dilation of distal small bowel with increased wall thickening at the terminal ileum and ileocecal valve with 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, stable bilateral paracolic gutter peritoneal thickening Panitumumab/ Encorafenib 01/12/2022 (encorafenib 01/19/2022) Panitumumab 02/02/2022 Panitumumab 02/20/2022 Panitumumab 03/06/2022 Panitumumab 03/20/2022 Panitumumab 04/03/2022 CTs 04/14/2022-decrease size of liver metastases, new borderline subcarinal adenopathy, improved peritoneal thickening, cirrhosis or pseudocirrhosis, persistent dilated cecum Encorafenib and Panitumumab continued Panitumumab 04/18/2022 Panitumumab 05/05/2022 Panitumumab 05/19/2022 Panitumumab 06/02/2022 Panitumumab 06/16/2022, 4g IV mag and begin oral mag po once daily  Panitumumab 06/30/2022 Panitumumab 07/14/2022, 2 g IV magnesium, increase oral magnesium to 400 mg twice daily Panitumumab 07/28/2022, 2 g IV magnesium CTs 07/28/2022-no change in hepatic metastases, no new signs of metastatic disease, persistent mural thickening at the terminal  ileum/cecum mild splenomegaly unchanged Panitumumab 08/18/2022 Panitumumab 09/01/2022 Panitumumab 09/15/2022 Panitumumab 09/29/2022 Panitumumab 10/13/2022 Panitumumab 10/27/2022 CTs 11/03/2022-unchanged hepatic metastases, no new lesions, 3.7 cm soft tissue density at the medial left kidney etiology unclear, improvement in wall thickening at the ileocecal junction development of appendiceal dilatation Panitumumab 11/10/2022 Panitumumab 11/24/2022 Panitumumab 12/08/2022 Panitumumab 12/22/2022 Panitumumab 01/05/2023 Panitumumab 02/02/2023 Panitumumab 02/16/2023 CTs 03/01/2023-unchanged cecal mass. Enlargement of left liver lesion, other liver lesions unchanged. Near complete resolution of soft tissue left renal pelvis. No pericardial effusion. Panitumumab 03/02/2023 Panitumumab 03/16/2023 Panitumumab 03/30/2023 Anemia secondary to GI blood loss, and chemotherapy, on oral iron BID  Red cell transfusion 02/02/2023 Cirrhosis 12/26/2019-hospital admission for right lower extremity DVT and GI bleed, treated with heparin anticoagulation beginning 12/26/2019, converted to Lovenox at discharge 12/29/2019 Lovenox reduced to 40 mg daily 02/10/2021 secondary to bruising and nosebleeding History of low-grade fever-likely "tumor" fever COVID-19 infection January 2021 Admission 01/19/2023 with dyspnea/orthopnea, pericardial effusion, EKG changes suggestive of pericarditis Echocardiogram 01/20/2023-LVEF 60-65%, small to moderate circumferential pericardial effusion without tamponade physiology Echocardiogram 02/19/2022-LVEF 70-75%; trivial pericardial effusion. Anemia secondary to chemotherapy, chronic disease, and phlebotomy 2 units of packed red blood cells 02/03/2023    Disposition: Ms. Leverington appears stable.  There is no clinical evidence for progression of the metastatic colon cancer, but the CEA was higher 2 weeks ago.  We will follow-up on the CEA from today.  She will continue encorafenib and panitumumab.  She will be  scheduled for a restaging CT within the next 2 months.  The pericardial effusion was improved on the echocardiogram 02/20/2023.  She continues colchicine.  Ms. Ferring will return for an office visit and panitumumab in 2 weeks.  Thornton Papas, MD  03/30/2023  8:39 AM

## 2023-03-30 NOTE — Progress Notes (Signed)
Patient seen by Dr. Truett Perna today  Vitals are within treatment parameters.  Labs reviewed by Dr. Truett Perna and are not all within treatment parameters. Mg+ 1.6 -OK to proceed Per physician team, patient is ready for treatment and there are NO modifications to the treatment plan.

## 2023-03-30 NOTE — Patient Instructions (Signed)
[place] [facility]   Discharge Instructions: Thank you for choosing [facility] to provide your oncology and hematology care.   If you have a lab appointment with the Cancer Center, please go directly to the Cancer Center and check in at the registration area.   Wear comfortable clothing and clothing appropriate for easy access to any Portacath or PICC line.   We strive to give you quality time with your provider. You may need to reschedule your appointment if you arrive late (15 or more minutes).  Arriving late affects you and other patients whose appointments are after yours.  Also, if you miss three or more appointments without notifying the office, you may be dismissed from the clinic at the provider's discretion.      For prescription refill requests, have your pharmacy contact our office and allow 72 hours for refills to be completed.    Today you received the following chemotherapy and/or immunotherapy agents Vectibix      To help prevent nausea and vomiting after your treatment, we encourage you to take your nausea medication as directed.  BELOW ARE SYMPTOMS THAT SHOULD BE REPORTED IMMEDIATELY: *FEVER GREATER THAN 100.4 F (38 C) OR HIGHER *CHILLS OR SWEATING *NAUSEA AND VOMITING THAT IS NOT CONTROLLED WITH YOUR NAUSEA MEDICATION *UNUSUAL SHORTNESS OF BREATH *UNUSUAL BRUISING OR BLEEDING *URINARY PROBLEMS (pain or burning when urinating, or frequent urination) *BOWEL PROBLEMS (unusual diarrhea, constipation, pain near the anus) TENDERNESS IN MOUTH AND THROAT WITH OR WITHOUT PRESENCE OF ULCERS (sore throat, sores in mouth, or a toothache) UNUSUAL RASH, SWELLINGPanitumumab Injection What is this medication? PANITUMUMAB (pan i TOOM ue mab) treats colorectal cancer. It works by blocking a protein that causes cancer cells to grow and multiply. This helps to slow or stop the spread of cancer cells. It is a monoclonal antibody. This medicine may  be used for other purposes; ask your health care provider or pharmacist if you have questions. COMMON BRAND NAME(S): Vectibix What should I tell my care team before I take this medication? They need to know if you have any of these conditions: Eye disease Low levels of magnesium in the blood Lung disease An unusual or allergic reaction to panitumumab, other medications, foods, dyes, or preservatives Pregnant or trying to get pregnant Breast-feeding How should I use this medication? This medication is injected into a vein. It is given by your care team in a hospital or clinic setting. Talk to your care team about the use of this medication in children. Special care may be needed. Overdosage: If you think you have taken too much of this medicine contact a poison control center or emergency room at once. NOTE: This medicine is only for you. Do not share this medicine with others. What if I miss a dose? Keep appointments for follow-up doses. It is important not to miss your dose. Call your care team if you are unable to keep an appointment. What may interact with this medication? Bevacizumab This list may not describe all possible interactions. Give your health care provider a list of all the medicines, herbs, non-prescription drugs, or dietary supplements you use. Also tell them if you smoke, drink alcohol, or use illegal drugs. Some items may interact with your medicine. What should I watch for while using this medication? Your condition will be monitored carefully while you are receiving this medication. This medication may make you feel generally unwell. This is not uncommon as chemotherapy can affect healthy cells  as well as cancer cells. Report any side effects. Continue your course of treatment even though you feel ill unless your care team tells you to stop. This medication can make you more sensitive to the sun. Keep out of the sun while receiving this medication and for 2 months after  stopping therapy. If you cannot avoid being in the sun, wear protective clothing and sunscreen. Do not use sun lamps, tanning beds, or tanning booths. Check with your care team if you have severe diarrhea, nausea, and vomiting or if you sweat a lot. The loss of too much body fluid may make it dangerous for you to take this medication. This medication may cause serious skin reactions. They can happen weeks to months after starting the medication. Contact your care team right away if you notice fevers or flu-like symptoms with a rash. The rash may be red or purple and then turn into blisters or peeling of the skin. You may also notice a red rash with swelling of the face, lips, or lymph nodes in your neck or under your arms. Talk to your care team if you may be pregnant. Serious birth defects can occur if you take this medication during pregnancy and for 2 months after the last dose. Contraception is recommended while taking this medication and for 2 months after the last dose. Your care team can help you find the option that works for you. Do not breastfeed while taking this medication and for 2 months after the last dose. This medication may cause infertility. Talk to your care team if you are concerned about your fertility. What side effects may I notice from receiving this medication? Side effects that you should report to your care team as soon as possible: Allergic reactions--skin rash, itching, hives, swelling of the face, lips, tongue, or throat Dry cough, shortness of breath or trouble breathing Eye pain, redness, irritation, or discharge with blurry or decreased vision Infusion reactions--chest pain, shortness of breath or trouble breathing, feeling faint or lightheaded Low magnesium level--muscle pain or cramps, unusual weakness or fatigue, fast or irregular heartbeat, tremors Low potassium level--muscle pain or cramps, unusual weakness or fatigue, fast or irregular heartbeat,  constipation Redness, blistering, peeling, or loosening of the skin, including inside the mouth Skin reactions on sun-exposed areas Side effects that usually do not require medical attention (report to your care team if they continue or are bothersome): Change in nail shape, thickness, or color Diarrhea Dry skin Fatigue Nausea Vomiting This list may not describe all possible side effects. Call your doctor for medical advice about side effects. You may report side effects to FDA at 1-800-FDA-1088. Where should I keep my medication? This medication is given in a hospital or clinic. It will not be stored at home. NOTE: This sheet is a summary. It may not cover all possible information. If you have questions about this medicine, talk to your doctor, pharmacist, or health care provider.  2024 Elsevier/Gold Standard (2021-12-14 00:00:00)  OR PAIN  UNUSUAL VAGINAL DISCHARGE OR ITCHING   Items with * indicate a potential emergency and should be followed up as soon as possible or go to the Emergency Department if any problems should occur.  Please show the CHEMOTHERAPY ALERT CARD or IMMUNOTHERAPY ALERT CARD at check-in to the Emergency Department and triage nurse.  Should you have questions after your visit or need to cancel or reschedule your appointment, please contact [facility]  Dept: [phone]  and follow the  prompts.  Office hours are 8:00 a.m. to 4:30 p.m. Monday - Friday. Please note that voicemails left after 4:00 p.m. may not be returned until the following business day.  We are closed weekends and major holidays. You have access to a nurse at all times for urgent questions. Please call the main number to the clinic Dept: [phone] and follow the prompts.   For any non-urgent questions, you may also contact your provider using MyChart. We now offer e-Visits for anyone 15 and older to request care online for non-urgent symptoms. For details visit  [email or web address].   Also download the MyChart app! Go to the app store, search "MyChart", open the app, select [place], and log in with your MyChart username and password.

## 2023-04-08 ENCOUNTER — Other Ambulatory Visit: Payer: Self-pay | Admitting: Oncology

## 2023-04-08 DIAGNOSIS — C182 Malignant neoplasm of ascending colon: Secondary | ICD-10-CM

## 2023-04-10 ENCOUNTER — Other Ambulatory Visit: Payer: Self-pay | Admitting: Cardiology

## 2023-04-10 DIAGNOSIS — I700 Atherosclerosis of aorta: Secondary | ICD-10-CM

## 2023-04-11 ENCOUNTER — Other Ambulatory Visit: Payer: Self-pay | Admitting: *Deleted

## 2023-04-11 DIAGNOSIS — C182 Malignant neoplasm of ascending colon: Secondary | ICD-10-CM

## 2023-04-12 ENCOUNTER — Other Ambulatory Visit: Payer: BC Managed Care – PPO

## 2023-04-12 ENCOUNTER — Inpatient Hospital Stay: Payer: BC Managed Care – PPO

## 2023-04-12 ENCOUNTER — Ambulatory Visit: Payer: BC Managed Care – PPO

## 2023-04-12 ENCOUNTER — Inpatient Hospital Stay: Payer: BC Managed Care – PPO | Admitting: Nurse Practitioner

## 2023-04-12 ENCOUNTER — Ambulatory Visit: Payer: BC Managed Care – PPO | Admitting: Nurse Practitioner

## 2023-04-12 NOTE — Progress Notes (Signed)
[facility] OFFICE PROGRESS NOTE   Diagnosis: Colon cancer  INTERVAL HISTORY:   Ms. Hell returns as scheduled.  She continues panitumumab and encorafenib.  No new complaint.  No chest pain.  No change in the skin rash.  She is no longer taking doxycycline.  Objective:  Vital signs in last 24 hours:  Blood pressure 139/61, pulse 78, temperature 98.2 F (36.8 C), temperature source Oral, resp. rate 18, height 5\' 7"  (1.702 m), weight 144 lb 12.8 oz (65.7 kg), SpO2 100%.    HEENT: No thrush or ulcers Resp: Lungs clear bilaterally Cardio: Regular rate and rhythm GI: No hepatosplenomegaly, nontender, no mass Vascular: No leg edema  Skin: Ecchymoses over the forearms  Portacath/PICC-without erythema  Lab Results:  Lab Results  Component Value Date   WBC 3.8 (L) 04/13/2023   HGB 9.4 (L) 04/13/2023   HCT 29.7 (L) 04/13/2023   MCV 86.8 04/13/2023   PLT 157 04/13/2023   NEUTROABS 2.3 04/13/2023    CMP  Lab Results  Component Value Date   NA 139 03/30/2023   K 4.2 03/30/2023   CL 107 03/30/2023   CO2 25 03/30/2023   GLUCOSE 85 03/30/2023   BUN 20 03/30/2023   CREATININE 0.75 03/30/2023   CALCIUM 8.8 (L) 03/30/2023   PROT 6.5 03/30/2023   ALBUMIN 3.5 03/30/2023   AST 15 03/30/2023   ALT 9 03/30/2023   ALKPHOS 101 03/30/2023   BILITOT 0.4 03/30/2023   GFRNONAA >60 03/30/2023   GFRAA >60 05/06/2020    Lab Results  Component Value Date   CEA1 54.57 (H) 12/30/2020   CEA 100.15 (H) 03/30/2023    Medications: I have reviewed the patient's current medications.   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  Cirrhosis.  Innumerable liver lesions. Colonoscopy 12/18/2019-ulcerated partially obstructing large mass in the mid ascending colon.  Biopsy invasive adenocarcinoma; preserved expression major MMR proteins Upper endoscopy  12/18/2019-normal esophagus, stomach, examined duodenum CEA 12/19/2019-8,923 Biopsy liver lesion 12/23/2019-adenocarcinoma Foundation 1-K-ras wild-type; tumor mutation burden greater than or equal to 10; microsatellite stable; BRAF V600E Cycle 1 FOLFOX 01/01/2020 Cycle 2 FOLFOX 01/15/2020  Cycle 3 FOLFOX 01/29/2020, Udenyca added Cycle 4 FOLFOX 02/12/2020, Udenyca held Cycle 5 FOLFOX 02/26/2020, Udenyca Cycle 6 FOLFOX 03/11/2020, Udenyca held CT abdomen/pelvis 03/16/2020-stable diffuse liver lesions, stable strictured appearing ascending colon, possible pericolonic implant, vague left lower lobe nodule Cycle 1 FOLFIRI/Avastin 04/06/2020 Cycle 2 FOLFIRI/Avastin 04/21/2020 Cycle 3 FOLFIRI/Avastin 05/06/2020 Cycle 4 FOLFIRI/Avastin 05/20/2020 Cycle 5 FOLFIRI/Avastin 06/03/2020 CTs 06/15/2020-mild to moderate improvement in hepatic metastasis.  No new or progressive disease.  Narrowing within the proximal ascending colon with upstream mild cecal and terminal ileum dilatation, similar. Cycle 6 FOLFIRI/Avastin 06/17/2020 Cycle 7 FOLFIRI/Avastin 07/01/2020 Cycle 8 FOLFIRI/Avastin 07/15/2020 Cycle 9 FOLFIRI/Avastin 07/29/2020-5-FU bolus eliminated, 5-FU infusion and irinotecan dose reduced Cycle 10 FOLFIRI/Avastin 08/12/2020 CTs 08/23/2020- mild residual wall thickening involving the cecum/ascending colon.  Numerous liver metastases improved. Cycle 11 FOLFIRI/Avastin 08/25/2020 Cycle 12 FOLFIRI/Avastin 09/16/2020 Cycle 13 FOLFIRI/Avastin 10/07/2020 Cycle 14 FOLFIRI/Avastin 10/28/2020 Cycle 15 FOLFIRI/Avastin 11/18/2020 CT abdomen/pelvis 12/06/2020-mild improvement in multifocal hepatic metastases, wall thickening at the ascending colon with pericolonic inflammatory changes Cycle 16 FOLFIRI/Avastin 12/09/2020 Cycle 17 FOLFIRI/Avastin 12/30/2020 Cycle 18 FOLFIRI/Avastin 01/20/2021 Cycle 19 FOLFIRI/Avastin 02/10/2021 Cycle 20 FOLFIRI/Avastin 03/03/2021 Cycle 21 FOLFIRI/Avastin 03/24/2021 Cycle 22 FOLFIRI/Avastin 04/14/2021 CTs  05/04/2021-slight improvement in hepatic metastatic disease. Cycle 23 FOLFIRI/Avastin 05/05/2021 Cycle 24 FOLFIRI/Avastin 05/25/2021 Cycle  25 FOLFIRI/Avastin 06/15/2021 Cycle 26 FOLFIRI/Avastin 07/13/2021 Cycle 27 FOLFIRI/Avastin 08/02/2021 CTs 08/30/2021-majority of liver lesions are unchanged in size, 1 lesion adjacent to the gallbladder has increased, no adenopathy, persistent pericolonic stranding and peritoneal thickening at the cecum Progressive rise in CEA 08/31/2021-treatment changed to Xeloda/bevacizumab, began [place] 09/22/2021 CEA stable 11/03/2021 Xeloda/bevacizumab continued CTs 01/10/2022-increase in size of some of the liver lesions, others are stable, new tiny right lower lobe nodule, increased dilation of distal small bowel with increased wall thickening at the terminal ileum and ileocecal valve with 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, stable bilateral paracolic gutter peritoneal thickening Panitumumab/ Encorafenib 01/12/2022 (encorafenib 01/19/2022) Panitumumab 02/02/2022 Panitumumab 02/20/2022 Panitumumab 03/06/2022 Panitumumab 03/20/2022 Panitumumab 04/03/2022 CTs 04/14/2022-decrease size of liver metastases, new borderline subcarinal adenopathy, improved peritoneal thickening, cirrhosis or pseudocirrhosis, persistent dilated cecum Encorafenib and Panitumumab continued Panitumumab 04/18/2022 Panitumumab 05/05/2022 Panitumumab 05/19/2022 Panitumumab 06/02/2022 Panitumumab 06/16/2022, 4g IV mag and begin oral mag po once daily  Panitumumab 06/30/2022 Panitumumab 07/14/2022, 2 g IV magnesium, increase oral magnesium to 400 mg twice daily Panitumumab 07/28/2022, 2 g IV magnesium CTs 07/28/2022-no change in hepatic metastases, no new signs of metastatic disease, persistent mural thickening at the terminal ileum/cecum mild splenomegaly unchanged Panitumumab 08/18/2022 Panitumumab 09/01/2022 Panitumumab 09/15/2022 Panitumumab 09/29/2022 Panitumumab 10/13/2022 Panitumumab 10/27/2022 CTs  11/03/2022-unchanged hepatic metastases, no new lesions, 3.7 cm soft tissue density at the medial left kidney etiology unclear, improvement in wall thickening at the ileocecal junction development of appendiceal dilatation Panitumumab 11/10/2022 Panitumumab 11/24/2022 Panitumumab 12/08/2022 Panitumumab 12/22/2022 Panitumumab 01/05/2023 Panitumumab 02/02/2023 Panitumumab 02/16/2023 CTs 03/01/2023-unchanged cecal mass. Enlargement of left liver lesion, other liver lesions unchanged. Near complete resolution of soft tissue left renal pelvis. No pericardial effusion. Panitumumab 03/02/2023 Panitumumab 03/16/2023 Panitumumab 03/30/2023 Panitumumab 04/13/2023 Anemia secondary to GI blood loss, and chemotherapy, on oral iron BID  Red cell transfusion 02/02/2023 Cirrhosis 12/26/2019-hospital admission for right lower extremity DVT and GI bleed, treated with heparin anticoagulation beginning 12/26/2019, converted to Lovenox at discharge 12/29/2019 Lovenox reduced to 40 mg daily 02/10/2021 secondary to bruising and nosebleeding History of low-grade fever-likely "tumor" fever COVID-19 infection January 2021 Admission 01/19/2023 with dyspnea/orthopnea, pericardial effusion, EKG changes suggestive of pericarditis Echocardiogram 01/20/2023-LVEF 60-65%, small to moderate circumferential pericardial effusion without tamponade physiology Echocardiogram 02/19/2022-LVEF 70-75%; trivial pericardial effusion. Anemia secondary to chemotherapy, chronic disease, and phlebotomy 2 units of packed red blood cells 02/03/2023     Disposition: Ms. Olinski appears unchanged.  She continues to tolerate the panitumumab and encorafenib well.  She will receive IV magnesium supplementation today.  She will complete another treatment with panitumumab today.  The CEA has been higher over the past month.  We will follow-up on the CEA from today.  She will be scheduled for a restaging CT evaluation within the next 4-6 weeks.  Ms. Stoke will return  for an office visit and panitumumab in 2 weeks.  Thornton Papas, MD  04/13/2023  9:58 AM

## 2023-04-13 ENCOUNTER — Other Ambulatory Visit: Payer: BC Managed Care – PPO

## 2023-04-13 ENCOUNTER — Inpatient Hospital Stay: Payer: BC Managed Care – PPO

## 2023-04-13 ENCOUNTER — Inpatient Hospital Stay: Payer: BC Managed Care – PPO | Admitting: Oncology

## 2023-04-13 ENCOUNTER — Other Ambulatory Visit: Payer: Self-pay | Admitting: *Deleted

## 2023-04-13 ENCOUNTER — Ambulatory Visit: Payer: BC Managed Care – PPO | Admitting: Nurse Practitioner

## 2023-04-13 ENCOUNTER — Ambulatory Visit: Payer: BC Managed Care – PPO

## 2023-04-13 ENCOUNTER — Encounter: Payer: Self-pay | Admitting: *Deleted

## 2023-04-13 VITALS — BP 159/67 | HR 66

## 2023-04-13 VITALS — BP 139/61 | HR 78 | Temp 98.2°F | Resp 18 | Ht 67.0 in | Wt 144.8 lb

## 2023-04-13 DIAGNOSIS — Z5112 Encounter for antineoplastic immunotherapy: Secondary | ICD-10-CM | POA: Diagnosis not present

## 2023-04-13 DIAGNOSIS — C182 Malignant neoplasm of ascending colon: Secondary | ICD-10-CM

## 2023-04-13 DIAGNOSIS — C787 Secondary malignant neoplasm of liver and intrahepatic bile duct: Secondary | ICD-10-CM | POA: Diagnosis not present

## 2023-04-13 DIAGNOSIS — D6481 Anemia due to antineoplastic chemotherapy: Secondary | ICD-10-CM | POA: Diagnosis not present

## 2023-04-13 DIAGNOSIS — K7460 Unspecified cirrhosis of liver: Secondary | ICD-10-CM | POA: Diagnosis not present

## 2023-04-13 LAB — CBC WITH DIFFERENTIAL (CANCER CENTER ONLY)
Abs Immature Granulocytes: 0.01 10*3/uL (ref 0.00–0.07)
Basophils Absolute: 0 10*3/uL (ref 0.0–0.1)
Basophils Relative: 1 %
Eosinophils Absolute: 0.1 10*3/uL (ref 0.0–0.5)
Eosinophils Relative: 4 %
HCT: 29.7 % — ABNORMAL LOW (ref 36.0–46.0)
Hemoglobin: 9.4 g/dL — ABNORMAL LOW (ref 12.0–15.0)
Immature Granulocytes: 0 %
Lymphocytes Relative: 21 %
Lymphs Abs: 0.8 10*3/uL (ref 0.7–4.0)
MCH: 27.5 pg (ref 26.0–34.0)
MCHC: 31.6 g/dL (ref 30.0–36.0)
MCV: 86.8 fL (ref 80.0–100.0)
Monocytes Absolute: 0.5 10*3/uL (ref 0.1–1.0)
Monocytes Relative: 13 %
Neutro Abs: 2.3 10*3/uL (ref 1.7–7.7)
Neutrophils Relative %: 61 %
Platelet Count: 157 10*3/uL (ref 150–400)
RBC: 3.42 MIL/uL — ABNORMAL LOW (ref 3.87–5.11)
RDW: 16.4 % — ABNORMAL HIGH (ref 11.5–15.5)
WBC Count: 3.8 10*3/uL — ABNORMAL LOW (ref 4.0–10.5)
nRBC: 0 % (ref 0.0–0.2)

## 2023-04-13 LAB — MAGNESIUM: Magnesium: 1.4 mg/dL — ABNORMAL LOW (ref 1.7–2.4)

## 2023-04-13 LAB — CEA (ACCESS): CEA (CHCC): 98.56 ng/mL — ABNORMAL HIGH (ref 0.00–5.00)

## 2023-04-13 MED ORDER — SODIUM CHLORIDE 0.9% FLUSH
10.0000 mL | INTRAVENOUS | Status: DC | PRN
  Administered 2023-04-13: 10 mL

## 2023-04-13 MED ORDER — SODIUM CHLORIDE 0.9 % IV SOLN
6.0000 mg/kg | Freq: Once | INTRAVENOUS | Status: AC
  Administered 2023-04-13: 400 mg via INTRAVENOUS
  Filled 2023-04-13: qty 20

## 2023-04-13 MED ORDER — HEPARIN SOD (PORK) LOCK FLUSH 100 UNIT/ML IV SOLN
500.0000 [IU] | Freq: Once | INTRAVENOUS | Status: AC | PRN
  Administered 2023-04-13: 500 [IU]

## 2023-04-13 MED ORDER — SODIUM CHLORIDE 0.9 % IV SOLN: Freq: Once | INTRAVENOUS | Status: AC

## 2023-04-13 MED ORDER — MAGNESIUM SULFATE 2 GM/50ML IV SOLN
2.0000 g | Freq: Once | INTRAVENOUS | Status: AC
  Administered 2023-04-13: 2 g via INTRAVENOUS
  Filled 2023-04-13: qty 50

## 2023-04-13 NOTE — Patient Instructions (Signed)
 [place] [facility]   Discharge Instructions: Thank you for choosing [facility] to provide your oncology and hematology care.   If you have a lab appointment with the Cancer Center, please go directly to the Cancer Center and check in at the registration area.   Wear comfortable clothing and clothing appropriate for easy access to any Portacath or PICC line.   We strive to give you quality time with your provider. You may need to reschedule your appointment if you arrive late (15 or more minutes).  Arriving late affects you and other patients whose appointments are after yours.  Also, if you miss three or more appointments without notifying the office, you may be dismissed from the clinic at the provider's discretion.      For prescription refill requests, have your pharmacy contact our office and allow 72 hours for refills to be completed.    Today you received the following chemotherapy and/or immunotherapy agents Vectibix      To help prevent nausea and vomiting after your treatment, we encourage you to take your nausea medication as directed.  BELOW ARE SYMPTOMS THAT SHOULD BE REPORTED IMMEDIATELY: *FEVER GREATER THAN 100.4 F (38 C) OR HIGHER *CHILLS OR SWEATING *NAUSEA AND VOMITING THAT IS NOT CONTROLLED WITH YOUR NAUSEA MEDICATION *UNUSUAL SHORTNESS OF BREATH *UNUSUAL BRUISING OR BLEEDING *URINARY PROBLEMS (pain or burning when urinating, or frequent urination) *BOWEL PROBLEMS (unusual diarrhea, constipation, pain near the anus) TENDERNESS IN MOUTH AND THROAT WITH OR WITHOUT PRESENCE OF ULCERS (sore throat, sores in mouth, or a toothache) UNUSUAL RASH, SWELLINGPanitumumab Injection What is this medication? PANITUMUMAB (pan i TOOM ue mab) treats colorectal cancer. It works by blocking a protein that causes cancer cells to grow and multiply. This helps to slow or stop the spread of cancer cells. It is a monoclonal antibody. This medicine may  be used for other purposes; ask your health care provider or pharmacist if you have questions. COMMON BRAND NAME(S): Vectibix What should I tell my care team before I take this medication? They need to know if you have any of these conditions: Eye disease Low levels of magnesium in the blood Lung disease An unusual or allergic reaction to panitumumab, other medications, foods, dyes, or preservatives Pregnant or trying to get pregnant Breast-feeding How should I use this medication? This medication is injected into a vein. It is given by your care team in a hospital or clinic setting. Talk to your care team about the use of this medication in children. Special care may be needed. Overdosage: If you think you have taken too much of this medicine contact a poison control center or emergency room at once. NOTE: This medicine is only for you. Do not share this medicine with others. What if I miss a dose? Keep appointments for follow-up doses. It is important not to miss your dose. Call your care team if you are unable to keep an appointment. What may interact with this medication? Bevacizumab This list may not describe all possible interactions. Give your health care provider a list of all the medicines, herbs, non-prescription drugs, or dietary supplements you use. Also tell them if you smoke, drink alcohol, or use illegal drugs. Some items may interact with your medicine. What should I watch for while using this medication? Your condition will be monitored carefully while you are receiving this medication. This medication may make you feel generally unwell. This is not uncommon as chemotherapy can affect healthy cells  as well as cancer cells. Report any side effects. Continue your course of treatment even though you feel ill unless your care team tells you to stop. This medication can make you more sensitive to the sun. Keep out of the sun while receiving this medication and for 2 months after  stopping therapy. If you cannot avoid being in the sun, wear protective clothing and sunscreen. Do not use sun lamps, tanning beds, or tanning booths. Check with your care team if you have severe diarrhea, nausea, and vomiting or if you sweat a lot. The loss of too much body fluid may make it dangerous for you to take this medication. This medication may cause serious skin reactions. They can happen weeks to months after starting the medication. Contact your care team right away if you notice fevers or flu-like symptoms with a rash. The rash may be red or purple and then turn into blisters or peeling of the skin. You may also notice a red rash with swelling of the face, lips, or lymph nodes in your neck or under your arms. Talk to your care team if you may be pregnant. Serious birth defects can occur if you take this medication during pregnancy and for 2 months after the last dose. Contraception is recommended while taking this medication and for 2 months after the last dose. Your care team can help you find the option that works for you. Do not breastfeed while taking this medication and for 2 months after the last dose. This medication may cause infertility. Talk to your care team if you are concerned about your fertility. What side effects may I notice from receiving this medication? Side effects that you should report to your care team as soon as possible: Allergic reactions--skin rash, itching, hives, swelling of the face, lips, tongue, or throat Dry cough, shortness of breath or trouble breathing Eye pain, redness, irritation, or discharge with blurry or decreased vision Infusion reactions--chest pain, shortness of breath or trouble breathing, feeling faint or lightheaded Low magnesium level--muscle pain or cramps, unusual weakness or fatigue, fast or irregular heartbeat, tremors Low potassium level--muscle pain or cramps, unusual weakness or fatigue, fast or irregular heartbeat,  constipation Redness, blistering, peeling, or loosening of the skin, including inside the mouth Skin reactions on sun-exposed areas Side effects that usually do not require medical attention (report to your care team if they continue or are bothersome): Change in nail shape, thickness, or color Diarrhea Dry skin Fatigue Nausea Vomiting This list may not describe all possible side effects. Call your doctor for medical advice about side effects. You may report side effects to FDA at 1-800-FDA-1088. Where should I keep my medication? This medication is given in a hospital or clinic. It will not be stored at home. NOTE: This sheet is a summary. It may not cover all possible information. If you have questions about this medicine, talk to your doctor, pharmacist, or health care provider.  2024 Elsevier/Gold Standard (2021-12-14 00:00:00)  OR PAIN  UNUSUAL VAGINAL DISCHARGE OR ITCHING   Items with * indicate a potential emergency and should be followed up as soon as possible or go to the Emergency Department if any problems should occur.  Please show the CHEMOTHERAPY ALERT CARD or IMMUNOTHERAPY ALERT CARD at check-in to the Emergency Department and triage nurse.  Should you have questions after your visit or need to cancel or reschedule your appointment, please contact [facility]  Dept: [phone]  and follow the  prompts.  Office hours are 8:00 a.m. to 4:30 p.m. Monday - Friday. Please note that voicemails left after 4:00 p.m. may not be returned until the following business day.  We are closed weekends and major holidays. You have access to a nurse at all times for urgent questions. Please call the main number to the clinic Dept: [phone] and follow the prompts.   For any non-urgent questions, you may also contact your provider using MyChart. We now offer e-Visits for anyone 15 and older to request care online for non-urgent symptoms. For details visit  [email or web address].   Also download the MyChart app! Go to the app store, search "MyChart", open the app, select [facility], and log in with your MyChart username and password.

## 2023-04-13 NOTE — Patient Instructions (Signed)

## 2023-04-13 NOTE — Progress Notes (Signed)
Patient seen by Dr. Truett Perna today  Vitals are within treatment parameters.  Labs reviewed by Dr. Truett Perna and are not all within treatment parameters. Mg+ 1.4--proceed w/tx  Per physician team, patient is ready for treatment. Please note that modifications are being made to the treatment plan including Will administer 2 grams IV Mg+ today

## 2023-04-26 ENCOUNTER — Encounter: Payer: Self-pay | Admitting: Oncology

## 2023-04-27 ENCOUNTER — Telehealth: Payer: Self-pay

## 2023-04-27 ENCOUNTER — Inpatient Hospital Stay: Payer: BC Managed Care – PPO

## 2023-04-27 ENCOUNTER — Encounter: Payer: Self-pay | Admitting: Nurse Practitioner

## 2023-04-27 ENCOUNTER — Inpatient Hospital Stay: Payer: BC Managed Care – PPO | Attending: Oncology

## 2023-04-27 ENCOUNTER — Inpatient Hospital Stay (HOSPITAL_BASED_OUTPATIENT_CLINIC_OR_DEPARTMENT_OTHER): Payer: BC Managed Care – PPO | Admitting: Nurse Practitioner

## 2023-04-27 VITALS — BP 143/68 | HR 72

## 2023-04-27 VITALS — BP 134/60 | HR 73 | Resp 18 | Ht 67.0 in | Wt 146.7 lb

## 2023-04-27 DIAGNOSIS — C182 Malignant neoplasm of ascending colon: Secondary | ICD-10-CM

## 2023-04-27 DIAGNOSIS — D500 Iron deficiency anemia secondary to blood loss (chronic): Secondary | ICD-10-CM | POA: Insufficient documentation

## 2023-04-27 DIAGNOSIS — Z5112 Encounter for antineoplastic immunotherapy: Secondary | ICD-10-CM | POA: Diagnosis not present

## 2023-04-27 DIAGNOSIS — Z86718 Personal history of other venous thrombosis and embolism: Secondary | ICD-10-CM | POA: Diagnosis not present

## 2023-04-27 DIAGNOSIS — C787 Secondary malignant neoplasm of liver and intrahepatic bile duct: Secondary | ICD-10-CM | POA: Insufficient documentation

## 2023-04-27 DIAGNOSIS — D6481 Anemia due to antineoplastic chemotherapy: Secondary | ICD-10-CM | POA: Diagnosis not present

## 2023-04-27 DIAGNOSIS — T451X5A Adverse effect of antineoplastic and immunosuppressive drugs, initial encounter: Secondary | ICD-10-CM | POA: Diagnosis not present

## 2023-04-27 LAB — CMP (CANCER CENTER ONLY)
ALT: 11 U/L (ref 0–44)
AST: 19 U/L (ref 15–41)
Albumin: 3.6 g/dL (ref 3.5–5.0)
Alkaline Phosphatase: 103 U/L (ref 38–126)
Anion gap: 5 (ref 5–15)
BUN: 22 mg/dL (ref 8–23)
CO2: 26 mmol/L (ref 22–32)
Calcium: 8.8 mg/dL — ABNORMAL LOW (ref 8.9–10.3)
Chloride: 106 mmol/L (ref 98–111)
Creatinine: 0.66 mg/dL (ref 0.44–1.00)
GFR, Estimated: >60 mL/min (ref >60)
Glucose, Bld: 85 mg/dL (ref 70–99)
Potassium: 4.1 mmol/L (ref 3.5–5.1)
Sodium: 137 mmol/L (ref 135–145)
Total Bilirubin: 0.4 mg/dL (ref 0.3–1.2)
Total Protein: 6.5 g/dL (ref 6.5–8.1)

## 2023-04-27 LAB — CBC WITH DIFFERENTIAL (CANCER CENTER ONLY)
Abs Immature Granulocytes: 0.01 10*3/uL (ref 0.00–0.07)
Basophils Absolute: 0.1 10*3/uL (ref 0.0–0.1)
Basophils Relative: 1 %
Eosinophils Absolute: 0.2 10*3/uL (ref 0.0–0.5)
Eosinophils Relative: 4 %
HCT: 31.4 % — ABNORMAL LOW (ref 36.0–46.0)
Hemoglobin: 10.2 g/dL — ABNORMAL LOW (ref 12.0–15.0)
Immature Granulocytes: 0 %
Lymphocytes Relative: 21 %
Lymphs Abs: 1.1 10*3/uL (ref 0.7–4.0)
MCH: 28 pg (ref 26.0–34.0)
MCHC: 32.5 g/dL (ref 30.0–36.0)
MCV: 86.3 fL (ref 80.0–100.0)
Monocytes Absolute: 0.7 10*3/uL (ref 0.1–1.0)
Monocytes Relative: 13 %
Neutro Abs: 3.2 10*3/uL (ref 1.7–7.7)
Neutrophils Relative %: 61 %
Platelet Count: 183 10*3/uL (ref 150–400)
RBC: 3.64 MIL/uL — ABNORMAL LOW (ref 3.87–5.11)
RDW: 15.9 % — ABNORMAL HIGH (ref 11.5–15.5)
WBC Count: 5.2 10*3/uL (ref 4.0–10.5)
nRBC: 0 % (ref 0.0–0.2)

## 2023-04-27 LAB — MAGNESIUM: Magnesium: 1.7 mg/dL (ref 1.7–2.4)

## 2023-04-27 LAB — CEA (ACCESS): CEA (CHCC): 107.65 ng/mL — ABNORMAL HIGH (ref 0.00–5.00)

## 2023-04-27 MED ORDER — HEPARIN SOD (PORK) LOCK FLUSH 100 UNIT/ML IV SOLN
500.0000 [IU] | Freq: Once | INTRAVENOUS | Status: AC | PRN
  Administered 2023-04-27: 500 [IU]

## 2023-04-27 MED ORDER — SODIUM CHLORIDE 0.9 % IV SOLN: Freq: Once | INTRAVENOUS | Status: AC

## 2023-04-27 MED ORDER — SODIUM CHLORIDE 0.9 % IV SOLN
6.0000 mg/kg | Freq: Once | INTRAVENOUS | Status: AC
  Administered 2023-04-27: 400 mg via INTRAVENOUS
  Filled 2023-04-27: qty 20

## 2023-04-27 MED ORDER — SODIUM CHLORIDE 0.9% FLUSH
10.0000 mL | INTRAVENOUS | Status: DC | PRN
  Administered 2023-04-27: 10 mL

## 2023-04-27 NOTE — Patient Instructions (Signed)
Implanted [facility] Ed Guide An implanted port is a device that is placed under the skin. It is usually placed in the chest. The device may vary based on the need. Implanted ports can be used to give IV medicine, to take blood, or to give fluids. You may have an implanted port if: You need IV medicine that would be irritating to the small veins in your hands or arms. You need IV medicines, such as chemotherapy, for a long period of time. You need IV nutrition for a long period of time. You may have fewer limitations when using a port than you would if you used other types of long-term IVs. You will also likely be able to return to normal activities after your incision heals. An implanted port has two main parts: Reservoir. The reservoir is the part where a needle is inserted to give medicines or draw blood. The reservoir is round. After the port is placed, it appears as a small, raised area under your skin. Catheter. The catheter is a small, thin tube that connects the reservoir to a vein. Medicine that is inserted into the reservoir goes into the catheter and then into the vein. How is my port accessed? To access your port: A numbing cream may be placed on the skin over the port site. Your health care provider will put on a mask and sterile gloves. The skin over your port will be cleaned carefully with a germ-killing soap and allowed to dry. Your health care provider will gently pinch the port and insert a needle into it. Your health care provider will check for a blood return to make sure the port is in the vein and is still working (patent). If your port needs to remain accessed to get medicine continuously (constant infusion), your health care provider will place a clear bandage (dressing) over the needle site. The dressing and needle will need to be changed every week, or as told by your health care provider. What is flushing? Flushing helps keep the port working. Follow instructions from your  health care provider about how and when to flush the port. Ports are usually flushed with saline solution or a medicine called heparin. The need for flushing will depend on how the port is used: If the port is only used from time to time to give medicines or draw blood, the port may need to be flushed: Before and after medicines have been given. Before and after blood has been drawn. As part of routine maintenance. Flushing may be recommended every 4-6 weeks. If a constant infusion is running, the port may not need to be flushed. Throw away any syringes in a disposal container that is meant for sharp items (sharps container). You can buy a sharps container from a pharmacy, or you can make one by using an empty hard plastic bottle with a cover. How long will my port stay implanted? The port can stay in for as long as your health care provider thinks it is needed. When it is time for the port to come out, a surgery will be done to remove it. The surgery will be similar to the procedure that was done to put the port in. Follow these instructions at home: Caring for your port and port site Flush your port as told by your health care provider. If you need an infusion over several days, follow instructions from your health care provider about how to take care of your port site. Make sure you: Change your  dressing as told by your health care provider. Wash your hands with soap and water for at least 20 seconds before and after you change your dressing. If soap and water are not available, use alcohol-based hand sanitizer. Place any used dressings or infusion bags into a plastic bag. Throw that bag in the trash. Keep the dressing that covers the needle clean and dry. Do not get it wet. Do not use scissors or sharp objects near the infusion tubing. Keep any external tubes clamped, unless they are being used. Check your port site every day for signs of infection. Check for: Redness, swelling, or  pain. Fluid or blood. Warmth. Pus or a bad smell. Protect the skin around the port site. Avoid wearing bra straps that rub or irritate the site. Protect the skin around your port from seat belts. Place a soft pad over your chest if needed. Bathe or shower as told by your health care provider. The site may get wet as long as you are not actively receiving an infusion. General instructions  Return to your normal activities as told by your health care provider. Ask your health care provider what activities are safe for you. Carry a medical alert card or wear a medical alert bracelet at all times. This will let health care providers know that you have an implanted port in case of an emergency. Where to find more information American Cancer Society: www.cancer.org American Society of Clinical Oncology: www.cancer.net Contact a health care provider if: You have a fever or chills. You have redness, swelling, or pain at the port site. You have fluid or blood coming from your port site. Your incision feels warm to the touch. You have pus or a bad smell coming from the port site. Summary Implanted ports are usually placed in the chest for long-term IV access. Follow instructions from your health care provider about flushing the port and changing bandages (dressings). Take care of the area around your port by avoiding clothing that puts pressure on the area, and by watching for signs of infection. Protect the skin around your port from seat belts. Place a soft pad over your chest if needed. Contact a health care provider if you have a fever or you have redness, swelling, pain, fluid, or a bad smell at the port site. This information is not intended to replace advice given to you by your health care provider. Make sure you discuss any questions you have with your health care provider. Document Revised: 02/01/2021 Document Reviewed: 02/01/2021 Elsevier Patient Education  2024 [employer].

## 2023-04-27 NOTE — Telephone Encounter (Signed)
Patient stopped taking crestor 5mg 

## 2023-04-27 NOTE — Progress Notes (Signed)
[facility] OFFICE PROGRESS NOTE   Diagnosis: Colon cancer  INTERVAL HISTORY:   Stacy Burns returns as scheduled.  She continues Panitumumab and encorafenib.  She denies nausea vomiting.  No mouth sores.  No diarrhea.  No rash.  She denies abdominal pain.  No urinary symptoms.  No shortness of breath.  Occasional cough.  Occasional nosebleed.  She discontinued Crestor due to aching mainly in her arms.  Objective:  Vital signs in last 24 hours:  Blood pressure 134/60, pulse 73, resp. rate 18, height 5\' 7"  (1.702 m), weight 146 lb 11.2 oz (66.5 kg), SpO2 100%.    HEENT: No thrush or ulcers. Resp: Lungs clear bilaterally. Cardio: Regular rate and rhythm. GI: No hepatosplenomegaly.  No mass. Vascular: No leg edema. Skin: Ecchymoses over forearms. Port-A-Cath without erythema.  Lab Results:  Lab Results  Component Value Date   WBC 5.2 04/27/2023   HGB 10.2 (L) 04/27/2023   HCT 31.4 (L) 04/27/2023   MCV 86.3 04/27/2023   PLT 183 04/27/2023   NEUTROABS 3.2 04/27/2023    Imaging:  No results found.  Medications: I have reviewed the patient's current medications.  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  Cirrhosis.  Innumerable liver lesions. Colonoscopy 12/18/2019-ulcerated partially obstructing large mass in the mid ascending colon.  Biopsy invasive adenocarcinoma; preserved expression major MMR proteins Upper endoscopy 12/18/2019-normal esophagus, stomach, examined duodenum CEA 12/19/2019-8,923 Biopsy liver lesion 12/23/2019-adenocarcinoma Foundation 1-K-ras wild-type; tumor mutation burden greater than or equal to 10; microsatellite stable; BRAF V600E Cycle 1 FOLFOX 01/01/2020 Cycle 2 FOLFOX 01/15/2020  Cycle 3 FOLFOX 01/29/2020, Udenyca added Cycle 4 FOLFOX 02/12/2020, Udenyca held Cycle 5 FOLFOX 02/26/2020, Udenyca Cycle 6 FOLFOX 03/11/2020,  Udenyca held CT abdomen/pelvis 03/16/2020-stable diffuse liver lesions, stable strictured appearing ascending colon, possible pericolonic implant, vague left lower lobe nodule Cycle 1 FOLFIRI/Avastin 04/06/2020 Cycle 2 FOLFIRI/Avastin 04/21/2020 Cycle 3 FOLFIRI/Avastin 05/06/2020 Cycle 4 FOLFIRI/Avastin 05/20/2020 Cycle 5 FOLFIRI/Avastin 06/03/2020 CTs 06/15/2020-mild to moderate improvement in hepatic metastasis.  No new or progressive disease.  Narrowing within the proximal ascending colon with upstream mild cecal and terminal ileum dilatation, similar. Cycle 6 FOLFIRI/Avastin 06/17/2020 Cycle 7 FOLFIRI/Avastin 07/01/2020 Cycle 8 FOLFIRI/Avastin 07/15/2020 Cycle 9 FOLFIRI/Avastin 07/29/2020-5-FU bolus eliminated, 5-FU infusion and irinotecan dose reduced Cycle 10 FOLFIRI/Avastin 08/12/2020 CTs 08/23/2020- mild residual wall thickening involving the cecum/ascending colon.  Numerous liver metastases improved. Cycle 11 FOLFIRI/Avastin 08/25/2020 Cycle 12 FOLFIRI/Avastin 09/16/2020 Cycle 13 FOLFIRI/Avastin 10/07/2020 Cycle 14 FOLFIRI/Avastin 10/28/2020 Cycle 15 FOLFIRI/Avastin 11/18/2020 CT abdomen/pelvis 12/06/2020-mild improvement in multifocal hepatic metastases, wall thickening at the ascending colon with pericolonic inflammatory changes Cycle 16 FOLFIRI/Avastin 12/09/2020 Cycle 17 FOLFIRI/Avastin 12/30/2020 Cycle 18 FOLFIRI/Avastin 01/20/2021 Cycle 19 FOLFIRI/Avastin 02/10/2021 Cycle 20 FOLFIRI/Avastin 03/03/2021 Cycle 21 FOLFIRI/Avastin 03/24/2021 Cycle 22 FOLFIRI/Avastin 04/14/2021 CTs 05/04/2021-slight improvement in hepatic metastatic disease. Cycle 23 FOLFIRI/Avastin 05/05/2021 Cycle 24 FOLFIRI/Avastin 05/25/2021 Cycle 25 FOLFIRI/Avastin 06/15/2021 Cycle 26 FOLFIRI/Avastin 07/13/2021 Cycle 27 FOLFIRI/Avastin 08/02/2021 CTs 08/30/2021-majority of liver lesions are unchanged in size, 1 lesion adjacent to the gallbladder has increased, no adenopathy, persistent pericolonic stranding and peritoneal thickening  at the cecum Progressive rise in CEA 08/31/2021-treatment changed to Xeloda/bevacizumab, began [place] 09/22/2021 CEA stable 11/03/2021 Xeloda/bevacizumab continued CTs 01/10/2022-increase in size of some of the liver lesions, others are stable, new tiny right lower lobe nodule, increased dilation of distal small bowel with increased wall thickening at the terminal ileum and ileocecal valve with chronic stricture in the  proximal ascending colon, stable bilateral paracolic gutter peritoneal thickening Panitumumab/ Encorafenib 01/12/2022 (encorafenib 01/19/2022) Panitumumab 02/02/2022 Panitumumab 02/20/2022 Panitumumab 03/06/2022 Panitumumab 03/20/2022 Panitumumab 04/03/2022 CTs 04/14/2022-decrease size of liver metastases, new borderline subcarinal adenopathy, improved peritoneal thickening, cirrhosis or pseudocirrhosis, persistent dilated cecum Encorafenib and Panitumumab continued Panitumumab 04/18/2022 Panitumumab 05/05/2022 Panitumumab 05/19/2022 Panitumumab 06/02/2022 Panitumumab 06/16/2022, 4g IV mag and begin oral mag po once daily  Panitumumab 06/30/2022 Panitumumab 07/14/2022, 2 g IV magnesium, increase oral magnesium to 400 mg twice daily Panitumumab 07/28/2022, 2 g IV magnesium CTs 07/28/2022-no change in hepatic metastases, no new signs of metastatic disease, persistent mural thickening at the terminal ileum/cecum mild splenomegaly unchanged Panitumumab 08/18/2022 Panitumumab 09/01/2022 Panitumumab 09/15/2022 Panitumumab 09/29/2022 Panitumumab 10/13/2022 Panitumumab 10/27/2022 CTs 11/03/2022-unchanged hepatic metastases, no new lesions, 3.7 cm soft tissue density at the medial left kidney etiology unclear, improvement in wall thickening at the ileocecal junction development of appendiceal dilatation Panitumumab 11/10/2022 Panitumumab 11/24/2022 Panitumumab 12/08/2022 Panitumumab 12/22/2022 Panitumumab 01/05/2023 Panitumumab 02/02/2023 Panitumumab 02/16/2023 CTs 03/01/2023-unchanged cecal mass. Enlargement of  left liver lesion, other liver lesions unchanged. Near complete resolution of soft tissue left renal pelvis. No pericardial effusion. Panitumumab 03/02/2023 Panitumumab 03/16/2023 Panitumumab 03/30/2023 Panitumumab 04/13/2023 Panitumumab 04/27/2023 Anemia secondary to GI blood loss, and chemotherapy, on oral iron BID  Red cell transfusion 02/02/2023 Cirrhosis 12/26/2019-hospital admission for right lower extremity DVT and GI bleed, treated with heparin anticoagulation beginning 12/26/2019, converted to Lovenox at discharge 12/29/2019 Lovenox reduced to 40 mg daily 02/10/2021 secondary to bruising and nosebleeding History of low-grade fever-likely "tumor" fever COVID-19 infection January 2021 Admission 01/19/2023 with dyspnea/orthopnea, pericardial effusion, EKG changes suggestive of pericarditis Echocardiogram 01/20/2023-LVEF 60-65%, small to moderate circumferential pericardial effusion without tamponade physiology Echocardiogram 02/19/2022-LVEF 70-75%; trivial pericardial effusion. Anemia secondary to chemotherapy, chronic disease, and phlebotomy 2 units of packed red blood cells 02/03/2023      Disposition: Stacy Burns appears stable.  There is no clinical evidence of disease progression.  Plan to continue Panitumumab and encorafenib as she is currently taking.  We will follow-up on the CEA from today.  Plan for restaging CTs in approximately 1 month.  CBC and chemistry panel reviewed.  Labs adequate for treatment.  Magnesium is normal today.  She will return for follow-up and treatment in 2 weeks.  We are available to see her sooner if needed.  Lonna Cobb ANP/GNP-BC   04/27/2023  8:54 AM

## 2023-04-27 NOTE — Patient Instructions (Signed)
[facility]   Discharge Instructions: Thank you for choosing [facility] to provide your oncology and hematology care.   If you have a lab appointment with the Cancer Center, please go directly to the Cancer Center and check in at the registration area.   Wear comfortable clothing and clothing appropriate for easy access to any Portacath or PICC line.   We strive to give you quality time with your provider. You may need to reschedule your appointment if you arrive late (15 or more minutes).  Arriving late affects you and other patients whose appointments are after yours.  Also, if you miss three or more appointments without notifying the office, you may be dismissed from the clinic at the provider's discretion.      For prescription refill requests, have your pharmacy contact our office and allow 72 hours for refills to be completed.    Today you received the following chemotherapy and/or immunotherapy agents Vectibix      To help prevent nausea and vomiting after your treatment, we encourage you to take your nausea medication as directed.  BELOW ARE SYMPTOMS THAT SHOULD BE REPORTED IMMEDIATELY: *FEVER GREATER THAN 100.4 F (38 C) OR HIGHER *CHILLS OR SWEATING *NAUSEA AND VOMITING THAT IS NOT CONTROLLED WITH YOUR NAUSEA MEDICATION *UNUSUAL SHORTNESS OF BREATH *UNUSUAL BRUISING OR BLEEDING *URINARY PROBLEMS (pain or burning when urinating, or frequent urination) *BOWEL PROBLEMS (unusual diarrhea, constipation, pain near the anus) TENDERNESS IN MOUTH AND THROAT WITH OR WITHOUT PRESENCE OF ULCERS (sore throat, sores in mouth, or a toothache) UNUSUAL RASH, SWELLINGPanitumumab Injection What is this medication? PANITUMUMAB (pan i TOOM ue mab) treats colorectal cancer. It works by blocking a protein that causes cancer cells to grow and multiply. This helps to slow or stop the spread of cancer cells. It is a monoclonal antibody. This medicine  may be used for other purposes; ask your health care provider or pharmacist if you have questions. COMMON BRAND NAME(S): Vectibix What should I tell my care team before I take this medication? They need to know if you have any of these conditions: Eye disease Low levels of magnesium in the blood Lung disease An unusual or allergic reaction to panitumumab, other medications, foods, dyes, or preservatives Pregnant or trying to get pregnant Breast-feeding How should I use this medication? This medication is injected into a vein. It is given by your care team in a hospital or clinic setting. Talk to your care team about the use of this medication in children. Special care may be needed. Overdosage: If you think you have taken too much of this medicine contact a poison control center or emergency room at once. NOTE: This medicine is only for you. Do not share this medicine with others. What if I miss a dose? Keep appointments for follow-up doses. It is important not to miss your dose. Call your care team if you are unable to keep an appointment. What may interact with this medication? Bevacizumab This list may not describe all possible interactions. Give your health care provider a list of all the medicines, herbs, non-prescription drugs, or dietary supplements you use. Also tell them if you smoke, drink alcohol, or use illegal drugs. Some items may interact with your medicine. What should I watch for while using this medication? Your condition will be monitored carefully while you are receiving this medication. This medication may make you feel generally unwell. This is not uncommon as chemotherapy can affect healthy cells  as well as cancer cells. Report any side effects. Continue your course of treatment even though you feel ill unless your care team tells you to stop. This medication can make you more sensitive to the sun. Keep out of the sun while receiving this medication and for 2 months  after stopping therapy. If you cannot avoid being in the sun, wear protective clothing and sunscreen. Do not use sun lamps, tanning beds, or tanning booths. Check with your care team if you have severe diarrhea, nausea, and vomiting or if you sweat a lot. The loss of too much body fluid may make it dangerous for you to take this medication. This medication may cause serious skin reactions. They can happen weeks to months after starting the medication. Contact your care team right away if you notice fevers or flu-like symptoms with a rash. The rash may be red or purple and then turn into blisters or peeling of the skin. You may also notice a red rash with swelling of the face, lips, or lymph nodes in your neck or under your arms. Talk to your care team if you may be pregnant. Serious birth defects can occur if you take this medication during pregnancy and for 2 months after the last dose. Contraception is recommended while taking this medication and for 2 months after the last dose. Your care team can help you find the option that works for you. Do not breastfeed while taking this medication and for 2 months after the last dose. This medication may cause infertility. Talk to your care team if you are concerned about your fertility. What side effects may I notice from receiving this medication? Side effects that you should report to your care team as soon as possible: Allergic reactions--skin rash, itching, hives, swelling of the face, lips, tongue, or throat Dry cough, shortness of breath or trouble breathing Eye pain, redness, irritation, or discharge with blurry or decreased vision Infusion reactions--chest pain, shortness of breath or trouble breathing, feeling faint or lightheaded Low magnesium level--muscle pain or cramps, unusual weakness or fatigue, fast or irregular heartbeat, tremors Low potassium level--muscle pain or cramps, unusual weakness or fatigue, fast or irregular heartbeat,  constipation Redness, blistering, peeling, or loosening of the skin, including inside the mouth Skin reactions on sun-exposed areas Side effects that usually do not require medical attention (report to your care team if they continue or are bothersome): Change in nail shape, thickness, or color Diarrhea Dry skin Fatigue Nausea Vomiting This list may not describe all possible side effects. Call your doctor for medical advice about side effects. You may report side effects to FDA at 1-800-FDA-1088. Where should I keep my medication? This medication is given in a hospital or clinic. It will not be stored at home. NOTE: This sheet is a summary. It may not cover all possible information. If you have questions about this medicine, talk to your doctor, pharmacist, or health care provider.  2024 Elsevier/Gold Standard (2021-12-14 00:00:00)  OR PAIN  UNUSUAL VAGINAL DISCHARGE OR ITCHING   Items with * indicate a potential emergency and should be followed up as soon as possible or go to the Emergency Department if any problems should occur.  Please show the CHEMOTHERAPY ALERT CARD or IMMUNOTHERAPY ALERT CARD at check-in to the Emergency Department and triage nurse.  Should you have questions after your visit or need to cancel or reschedule your appointment, please contact [facility]  Dept: [phone]  and follow the  prompts.  Office hours are 8:00 a.m. to 4:30 p.m. Monday - Friday. Please note that voicemails left after 4:00 p.m. may not be returned until the following business day.  We are closed weekends and major holidays. You have access to a nurse at all times for urgent questions. Please call the main number to the clinic Dept: [phone] and follow the prompts.   For any non-urgent questions, you may also contact your provider using MyChart. We now offer e-Visits for anyone 70 and older to request care online for non-urgent symptoms. For details visit  [email or web address].   Also download the MyChart app! Go to the app store, search "MyChart", open the app, select [facility], and log in with your MyChart username and password.

## 2023-04-27 NOTE — Telephone Encounter (Signed)
I called Dr. Emelda Brothers office and spoke with Victorino Dike, the Certified Medical Assistant. I informed her that the patient has discontinued Crestor due to intolerance of the medication. I requested that they reach out to the patient to provide guidance.

## 2023-04-27 NOTE — Progress Notes (Signed)
Patient seen by Lisa Thomas NP today  Vitals are within treatment parameters.  Labs reviewed by Lisa Thomas NP and are within treatment parameters.  Per physician team, patient is ready for treatment and there are NO modifications to the treatment plan.     

## 2023-04-27 NOTE — Telephone Encounter (Signed)
Okay.   ST

## 2023-04-30 ENCOUNTER — Other Ambulatory Visit: Payer: Self-pay | Admitting: Nurse Practitioner

## 2023-04-30 DIAGNOSIS — C182 Malignant neoplasm of ascending colon: Secondary | ICD-10-CM

## 2023-05-05 ENCOUNTER — Other Ambulatory Visit: Payer: Self-pay | Admitting: Oncology

## 2023-05-09 ENCOUNTER — Other Ambulatory Visit: Payer: BC Managed Care – PPO

## 2023-05-09 ENCOUNTER — Ambulatory Visit: Payer: BC Managed Care – PPO | Admitting: Nurse Practitioner

## 2023-05-09 ENCOUNTER — Ambulatory Visit: Payer: BC Managed Care – PPO

## 2023-05-11 ENCOUNTER — Inpatient Hospital Stay: Payer: BC Managed Care – PPO

## 2023-05-11 ENCOUNTER — Ambulatory Visit: Payer: BC Managed Care – PPO

## 2023-05-11 ENCOUNTER — Ambulatory Visit: Payer: BC Managed Care – PPO | Admitting: Nurse Practitioner

## 2023-05-11 ENCOUNTER — Other Ambulatory Visit: Payer: BC Managed Care – PPO

## 2023-05-11 ENCOUNTER — Encounter: Payer: Self-pay | Admitting: Nurse Practitioner

## 2023-05-11 ENCOUNTER — Inpatient Hospital Stay: Payer: BC Managed Care – PPO | Admitting: Nurse Practitioner

## 2023-05-11 VITALS — BP 125/51 | HR 75 | Resp 18

## 2023-05-11 VITALS — BP 111/51 | HR 76 | Temp 98.2°F | Resp 18 | Ht 67.0 in | Wt 143.6 lb

## 2023-05-11 DIAGNOSIS — D500 Iron deficiency anemia secondary to blood loss (chronic): Secondary | ICD-10-CM | POA: Diagnosis not present

## 2023-05-11 DIAGNOSIS — Z86718 Personal history of other venous thrombosis and embolism: Secondary | ICD-10-CM | POA: Diagnosis not present

## 2023-05-11 DIAGNOSIS — D6481 Anemia due to antineoplastic chemotherapy: Secondary | ICD-10-CM | POA: Diagnosis not present

## 2023-05-11 DIAGNOSIS — C182 Malignant neoplasm of ascending colon: Secondary | ICD-10-CM

## 2023-05-11 DIAGNOSIS — T451X5A Adverse effect of antineoplastic and immunosuppressive drugs, initial encounter: Secondary | ICD-10-CM | POA: Diagnosis not present

## 2023-05-11 DIAGNOSIS — Z5112 Encounter for antineoplastic immunotherapy: Secondary | ICD-10-CM | POA: Diagnosis not present

## 2023-05-11 DIAGNOSIS — C787 Secondary malignant neoplasm of liver and intrahepatic bile duct: Secondary | ICD-10-CM | POA: Diagnosis not present

## 2023-05-11 LAB — CBC WITH DIFFERENTIAL (CANCER CENTER ONLY)
Abs Immature Granulocytes: 0.02 10*3/uL (ref 0.00–0.07)
Basophils Absolute: 0 10*3/uL (ref 0.0–0.1)
Basophils Relative: 1 %
Eosinophils Absolute: 0.2 10*3/uL (ref 0.0–0.5)
Eosinophils Relative: 4 %
HCT: 31.4 % — ABNORMAL LOW (ref 36.0–46.0)
Hemoglobin: 10.2 g/dL — ABNORMAL LOW (ref 12.0–15.0)
Immature Granulocytes: 0 %
Lymphocytes Relative: 16 %
Lymphs Abs: 1 10*3/uL (ref 0.7–4.0)
MCH: 28 pg (ref 26.0–34.0)
MCHC: 32.5 g/dL (ref 30.0–36.0)
MCV: 86.3 fL (ref 80.0–100.0)
Monocytes Absolute: 0.7 10*3/uL (ref 0.1–1.0)
Monocytes Relative: 12 %
Neutro Abs: 4 10*3/uL (ref 1.7–7.7)
Neutrophils Relative %: 67 %
Platelet Count: 198 10*3/uL (ref 150–400)
RBC: 3.64 MIL/uL — ABNORMAL LOW (ref 3.87–5.11)
RDW: 15.6 % — ABNORMAL HIGH (ref 11.5–15.5)
WBC Count: 5.9 10*3/uL (ref 4.0–10.5)
nRBC: 0 % (ref 0.0–0.2)

## 2023-05-11 LAB — CMP (CANCER CENTER ONLY)
ALT: 9 U/L (ref 0–44)
AST: 14 U/L — ABNORMAL LOW (ref 15–41)
Albumin: 3.7 g/dL (ref 3.5–5.0)
Alkaline Phosphatase: 102 U/L (ref 38–126)
Anion gap: 7 (ref 5–15)
BUN: 32 mg/dL — ABNORMAL HIGH (ref 8–23)
CO2: 24 mmol/L (ref 22–32)
Calcium: 9.2 mg/dL (ref 8.9–10.3)
Chloride: 110 mmol/L (ref 98–111)
Creatinine: 0.7 mg/dL (ref 0.44–1.00)
GFR, Estimated: >60 mL/min (ref >60)
Glucose, Bld: 114 mg/dL — ABNORMAL HIGH (ref 70–99)
Potassium: 3.8 mmol/L (ref 3.5–5.1)
Sodium: 141 mmol/L (ref 135–145)
Total Bilirubin: 0.3 mg/dL (ref 0.3–1.2)
Total Protein: 6.4 g/dL — ABNORMAL LOW (ref 6.5–8.1)

## 2023-05-11 LAB — MAGNESIUM: Magnesium: 1.7 mg/dL (ref 1.7–2.4)

## 2023-05-11 LAB — CEA (ACCESS): CEA (CHCC): 116.07 ng/mL — ABNORMAL HIGH (ref 0.00–5.00)

## 2023-05-11 MED ORDER — HEPARIN SOD (PORK) LOCK FLUSH 100 UNIT/ML IV SOLN
500.0000 [IU] | Freq: Once | INTRAVENOUS | Status: AC | PRN
  Administered 2023-05-11: 500 [IU]

## 2023-05-11 MED ORDER — SODIUM CHLORIDE 0.9 % IV SOLN
6.0000 mg/kg | Freq: Once | INTRAVENOUS | Status: AC
  Administered 2023-05-11: 400 mg via INTRAVENOUS
  Filled 2023-05-11: qty 20

## 2023-05-11 MED ORDER — SODIUM CHLORIDE 0.9 % IV SOLN: Freq: Once | INTRAVENOUS | Status: AC

## 2023-05-11 MED ORDER — SODIUM CHLORIDE 0.9% FLUSH
10.0000 mL | INTRAVENOUS | Status: DC | PRN
  Administered 2023-05-11: 10 mL

## 2023-05-11 MED ORDER — OXYCODONE-ACETAMINOPHEN 5-325 MG PO TABS
1.0000 | ORAL_TABLET | Freq: Three times a day (TID) | ORAL | 0 refills | Status: DC | PRN
Start: 2023-05-11 — End: 2023-07-06

## 2023-05-11 NOTE — Progress Notes (Signed)
[facility] OFFICE PROGRESS NOTE   Diagnosis: Colon cancer  INTERVAL HISTORY:   Stacy Burns returns as scheduled.  She continues Panitumumab and encorafenib.  She feels well.  No nausea or vomiting.  No mouth sores.  No diarrhea.  No spontaneous bleeding.  Stable bruising over the forearms.  No shortness of breath.  No cough or fever.  She has a good appetite.  No abdominal pain.  Objective:  Vital signs in last 24 hours:  Blood pressure (!) 111/51, pulse 76, temperature 98.2 F (36.8 C), temperature source Oral, resp. rate 18, height 5\' 7"  (1.702 m), weight 143 lb 9.6 oz (65.1 kg), SpO2 100%.    HEENT: No thrush or ulcers. Resp: Lungs clear bilaterally. Cardio: Regular rate and rhythm. GI: No hepatosplenomegaly.  Nontender.  No mass. Vascular: No leg edema. Neuro: Alert and oriented. Skin: Stable ecchymoses over the forearms. Port-A-Cath without erythema.  Lab Results:  Lab Results  Component Value Date   WBC 5.9 05/11/2023   HGB 10.2 (L) 05/11/2023   HCT 31.4 (L) 05/11/2023   MCV 86.3 05/11/2023   PLT 198 05/11/2023   NEUTROABS 4.0 05/11/2023    Imaging:  No results found.  Medications: I have reviewed the patient's current medications.  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  Cirrhosis.  Innumerable liver lesions. Colonoscopy 12/18/2019-ulcerated partially obstructing large mass in the mid ascending colon.  Biopsy invasive adenocarcinoma; preserved expression major MMR proteins Upper endoscopy 12/18/2019-normal esophagus, stomach, examined duodenum CEA 12/19/2019-8,923 Biopsy liver lesion 12/23/2019-adenocarcinoma Foundation 1-K-ras wild-type; tumor mutation burden greater than or equal to 10; microsatellite stable; BRAF V600E Cycle 1 FOLFOX 01/01/2020 Cycle 2 FOLFOX 01/15/2020  Cycle 3 FOLFOX 01/29/2020, Udenyca added Cycle 4 FOLFOX  02/12/2020, Udenyca held Cycle 5 FOLFOX 02/26/2020, Udenyca Cycle 6 FOLFOX 03/11/2020, Udenyca held CT abdomen/pelvis 03/16/2020-stable diffuse liver lesions, stable strictured appearing ascending colon, possible pericolonic implant, vague left lower lobe nodule Cycle 1 FOLFIRI/Avastin 04/06/2020 Cycle 2 FOLFIRI/Avastin 04/21/2020 Cycle 3 FOLFIRI/Avastin 05/06/2020 Cycle 4 FOLFIRI/Avastin 05/20/2020 Cycle 5 FOLFIRI/Avastin 06/03/2020 CTs 06/15/2020-mild to moderate improvement in hepatic metastasis.  No new or progressive disease.  Narrowing within the proximal ascending colon with upstream mild cecal and terminal ileum dilatation, similar. Cycle 6 FOLFIRI/Avastin 06/17/2020 Cycle 7 FOLFIRI/Avastin 07/01/2020 Cycle 8 FOLFIRI/Avastin 07/15/2020 Cycle 9 FOLFIRI/Avastin 07/29/2020-5-FU bolus eliminated, 5-FU infusion and irinotecan dose reduced Cycle 10 FOLFIRI/Avastin 08/12/2020 CTs 08/23/2020- mild residual wall thickening involving the cecum/ascending colon.  Numerous liver metastases improved. Cycle 11 FOLFIRI/Avastin 08/25/2020 Cycle 12 FOLFIRI/Avastin 09/16/2020 Cycle 13 FOLFIRI/Avastin 10/07/2020 Cycle 14 FOLFIRI/Avastin 10/28/2020 Cycle 15 FOLFIRI/Avastin 11/18/2020 CT abdomen/pelvis 12/06/2020-mild improvement in multifocal hepatic metastases, wall thickening at the ascending colon with pericolonic inflammatory changes Cycle 16 FOLFIRI/Avastin 12/09/2020 Cycle 17 FOLFIRI/Avastin 12/30/2020 Cycle 18 FOLFIRI/Avastin 01/20/2021 Cycle 19 FOLFIRI/Avastin 02/10/2021 Cycle 20 FOLFIRI/Avastin 03/03/2021 Cycle 21 FOLFIRI/Avastin 03/24/2021 Cycle 22 FOLFIRI/Avastin 04/14/2021 CTs 05/04/2021-slight improvement in hepatic metastatic disease. Cycle 23 FOLFIRI/Avastin 05/05/2021 Cycle 24 FOLFIRI/Avastin 05/25/2021 Cycle 25 FOLFIRI/Avastin 06/15/2021 Cycle 26 FOLFIRI/Avastin 07/13/2021 Cycle 27 FOLFIRI/Avastin 08/02/2021 CTs 08/30/2021-majority of liver lesions are unchanged in size, 1 lesion adjacent to the gallbladder has  increased, no adenopathy, persistent pericolonic stranding and peritoneal thickening at the cecum Progressive rise in CEA 08/31/2021-treatment changed to Xeloda/bevacizumab, began [place] 09/22/2021 CEA stable 11/03/2021 Xeloda/bevacizumab continued CTs 01/10/2022-increase in size of some of the liver lesions, others are stable, new tiny right lower lobe nodule, increased dilation of distal small  bowel with increased wall thickening at the terminal ileum and ileocecal valve with 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, stable bilateral paracolic gutter peritoneal thickening Panitumumab/ Encorafenib 01/12/2022 (encorafenib 01/19/2022) Panitumumab 02/02/2022 Panitumumab 02/20/2022 Panitumumab 03/06/2022 Panitumumab 03/20/2022 Panitumumab 04/03/2022 CTs 04/14/2022-decrease size of liver metastases, new borderline subcarinal adenopathy, improved peritoneal thickening, cirrhosis or pseudocirrhosis, persistent dilated cecum Encorafenib and Panitumumab continued Panitumumab 04/18/2022 Panitumumab 05/05/2022 Panitumumab 05/19/2022 Panitumumab 06/02/2022 Panitumumab 06/16/2022, 4g IV mag and begin oral mag po once daily  Panitumumab 06/30/2022 Panitumumab 07/14/2022, 2 g IV magnesium, increase oral magnesium to 400 mg twice daily Panitumumab 07/28/2022, 2 g IV magnesium CTs 07/28/2022-no change in hepatic metastases, no new signs of metastatic disease, persistent mural thickening at the terminal ileum/cecum mild splenomegaly unchanged Panitumumab 08/18/2022 Panitumumab 09/01/2022 Panitumumab 09/15/2022 Panitumumab 09/29/2022 Panitumumab 10/13/2022 Panitumumab 10/27/2022 CTs 11/03/2022-unchanged hepatic metastases, no new lesions, 3.7 cm soft tissue density at the medial left kidney etiology unclear, improvement in wall thickening at the ileocecal junction development of appendiceal dilatation Panitumumab 11/10/2022 Panitumumab 11/24/2022 Panitumumab 12/08/2022 Panitumumab 12/22/2022 Panitumumab 01/05/2023 Panitumumab  02/02/2023 Panitumumab 02/16/2023 CTs 03/01/2023-unchanged cecal mass. Enlargement of left liver lesion, other liver lesions unchanged. Near complete resolution of soft tissue left renal pelvis. No pericardial effusion. Panitumumab 03/02/2023 Panitumumab 03/16/2023 Panitumumab 03/30/2023 Panitumumab 04/13/2023 Panitumumab 04/27/2023 Panitumumab 05/11/2023 Anemia secondary to GI blood loss, and chemotherapy, on oral iron BID  Red cell transfusion 02/02/2023 Cirrhosis 12/26/2019-hospital admission for right lower extremity DVT and GI bleed, treated with heparin anticoagulation beginning 12/26/2019, converted to Lovenox at discharge 12/29/2019 Lovenox reduced to 40 mg daily 02/10/2021 secondary to bruising and nosebleeding History of low-grade fever-likely "tumor" fever COVID-19 infection January 2021 Admission 01/19/2023 with dyspnea/orthopnea, pericardial effusion, EKG changes suggestive of pericarditis Echocardiogram 01/20/2023-LVEF 60-65%, small to moderate circumferential pericardial effusion without tamponade physiology Echocardiogram 02/19/2022-LVEF 70-75%; trivial pericardial effusion. Anemia secondary to chemotherapy, chronic disease, and phlebotomy 2 units of packed red blood cells 02/03/2023    Disposition: Ms. Hulett appears well.  There is no clinical evidence of disease progression.  Plan to continue Panitumumab and encorafenib.  We will follow-up on the CEA from today.  Restaging CTs prior to next office visit.  CBC and chemistry panel reviewed.  Labs adequate to proceed as above.  She will return for follow-up and treatment in 2 weeks.    Lonna Cobb ANP/GNP-BC   05/11/2023  9:00 AM

## 2023-05-11 NOTE — Progress Notes (Signed)
Patient seen by Lonna Cobb NP today  Vitals are within treatment parameters:Yes   Labs are within treatment parameters: Yes   Treatment plan has been signed: Yes   Per physician team, Patient is ready for treatment and there are NO modifications to the treatment plan.

## 2023-05-11 NOTE — Patient Instructions (Signed)
[place] [facility] AT [facility]   Discharge Instructions: Thank you for choosing [facility] to provide your oncology and hematology care.   If you have a lab appointment with the Cancer Center, please go directly to the Cancer Center and check in at the registration area.   Wear comfortable clothing and clothing appropriate for easy access to any Portacath or PICC line.   We strive to give you quality time with your provider. You may need to reschedule your appointment if you arrive late (15 or more minutes).  Arriving late affects you and other patients whose appointments are after yours.  Also, if you miss three or more appointments without notifying the office, you may be dismissed from the clinic at the provider's discretion.      For prescription refill requests, have your pharmacy contact our office and allow 72 hours for refills to be completed.    Today you received the following chemotherapy and/or immunotherapy agents Panitumumab (VECTIBIX).      To help prevent nausea and vomiting after your treatment, we encourage you to take your nausea medication as directed.  BELOW ARE SYMPTOMS THAT SHOULD BE REPORTED IMMEDIATELY: *FEVER GREATER THAN 100.4 F (38 C) OR HIGHER *CHILLS OR SWEATING *NAUSEA AND VOMITING THAT IS NOT CONTROLLED WITH YOUR NAUSEA MEDICATION *UNUSUAL SHORTNESS OF BREATH *UNUSUAL BRUISING OR BLEEDING *URINARY PROBLEMS (pain or burning when urinating, or frequent urination) *BOWEL PROBLEMS (unusual diarrhea, constipation, pain near the anus) TENDERNESS IN MOUTH AND THROAT WITH OR WITHOUT PRESENCE OF ULCERS (sore throat, sores in mouth, or a toothache) UNUSUAL RASH, SWELLING OR PAIN  UNUSUAL VAGINAL DISCHARGE OR ITCHING   Items with * indicate a potential emergency and should be followed up as soon as possible or go to the Emergency Department if any problems should occur.  Please show the CHEMOTHERAPY ALERT CARD or IMMUNOTHERAPY ALERT  CARD at check-in to the Emergency Department and triage nurse.  Should you have questions after your visit or need to cancel or reschedule your appointment, please contact  CANCER CENTER AT [facility]  Dept: [phone]  and follow the prompts.  Office hours are 8:00 a.m. to 4:30 p.m. Monday - Friday. Please note that voicemails left after 4:00 p.m. may not be returned until the following business day.  We are closed weekends and major holidays. You have access to a nurse at all times for urgent questions. Please call the main number to the clinic Dept: [phone] and follow the prompts.   For any non-urgent questions, you may also contact your provider using MyChart. We now offer e-Visits for anyone 34 and older to request care online for non-urgent symptoms. For details visit [email or web address].   Also download the MyChart app! Go to the app store, search "MyChart", open the app, select , and log in with your MyChart username and password.  Panitumumab Injection What is this medication? PANITUMUMAB (pan i TOOM ue mab) treats colorectal cancer. It works by blocking a protein that causes cancer cells to grow and multiply. This helps to slow or stop the spread of cancer cells. It is a monoclonal antibody. This medicine may be used for other purposes; ask your health care provider or pharmacist if you have questions. COMMON BRAND NAME(S): Vectibix What should I tell my care team before I take this medication? They need to know if you have any of these conditions: Eye disease Low levels of magnesium in the blood Lung disease An unusual or allergic  reaction to panitumumab, other medications, foods, dyes, or preservatives Pregnant or trying to get pregnant Breast-feeding How should I use this medication? This medication is injected into a vein. It is given by your care team in a hospital or clinic setting. Talk to your care team about the use of this medication  in children. Special care may be needed. Overdosage: If you think you have taken too much of this medicine contact a poison control center or emergency room at once. NOTE: This medicine is only for you. Do not share this medicine with others. What if I miss a dose? Keep appointments for follow-up doses. It is important not to miss your dose. Call your care team if you are unable to keep an appointment. What may interact with this medication? Bevacizumab This list may not describe all possible interactions. Give your health care provider a list of all the medicines, herbs, non-prescription drugs, or dietary supplements you use. Also tell them if you smoke, drink alcohol, or use illegal drugs. Some items may interact with your medicine. What should I watch for while using this medication? Your condition will be monitored carefully while you are receiving this medication. This medication may make you feel generally unwell. This is not uncommon as chemotherapy can affect healthy cells as well as cancer cells. Report any side effects. Continue your course of treatment even though you feel ill unless your care team tells you to stop. This medication can make you more sensitive to the sun. Keep out of the sun while receiving this medication and for 2 months after stopping therapy. If you cannot avoid being in the sun, wear protective clothing and sunscreen. Do not use sun lamps, tanning beds, or tanning booths. Check with your care team if you have severe diarrhea, nausea, and vomiting or if you sweat a lot. The loss of too much body fluid may make it dangerous for you to take this medication. This medication may cause serious skin reactions. They can happen weeks to months after starting the medication. Contact your care team right away if you notice fevers or flu-like symptoms with a rash. The rash may be red or purple and then turn into blisters or peeling of the skin. You may also notice a red rash with  swelling of the face, lips, or lymph nodes in your neck or under your arms. Talk to your care team if you may be pregnant. Serious birth defects can occur if you take this medication during pregnancy and for 2 months after the last dose. Contraception is recommended while taking this medication and for 2 months after the last dose. Your care team can help you find the option that works for you. Do not breastfeed while taking this medication and for 2 months after the last dose. This medication may cause infertility. Talk to your care team if you are concerned about your fertility. What side effects may I notice from receiving this medication? Side effects that you should report to your care team as soon as possible: Allergic reactions--skin rash, itching, hives, swelling of the face, lips, tongue, or throat Dry cough, shortness of breath or trouble breathing Eye pain, redness, irritation, or discharge with blurry or decreased vision Infusion reactions--chest pain, shortness of breath or trouble breathing, feeling faint or lightheaded Low magnesium level--muscle pain or cramps, unusual weakness or fatigue, fast or irregular heartbeat, tremors Low potassium level--muscle pain or cramps, unusual weakness or fatigue, fast or irregular heartbeat, constipation Redness, blistering, peeling, or loosening  of the skin, including inside the mouth Skin reactions on sun-exposed areas Side effects that usually do not require medical attention (report to your care team if they continue or are bothersome): Change in nail shape, thickness, or color Diarrhea Dry skin Fatigue Nausea Vomiting This list may not describe all possible side effects. Call your doctor for medical advice about side effects. You may report side effects to FDA at 1-800-FDA-1088. Where should I keep my medication? This medication is given in a hospital or clinic. It will not be stored at home. NOTE: This sheet is a summary. It may not  cover all possible information. If you have questions about this medicine, talk to your doctor, pharmacist, or health care provider.  2024 Elsevier/Gold Standard (2021-12-14 00:00:00)

## 2023-05-18 ENCOUNTER — Other Ambulatory Visit: Payer: Self-pay | Admitting: Oncology

## 2023-05-19 ENCOUNTER — Ambulatory Visit (HOSPITAL_BASED_OUTPATIENT_CLINIC_OR_DEPARTMENT_OTHER)
Admission: RE | Admit: 2023-05-19 | Discharge: 2023-05-19 | Disposition: A | Discharge status: Home / Self Care | Payer: BC Managed Care – PPO | Source: Ambulatory Visit | Attending: Nurse Practitioner | Admitting: Nurse Practitioner

## 2023-05-19 DIAGNOSIS — C182 Malignant neoplasm of ascending colon: Secondary | ICD-10-CM | POA: Insufficient documentation

## 2023-05-19 MED ORDER — IOHEXOL 300 MG/ML  SOLN
100.0000 mL | Freq: Once | INTRAMUSCULAR | Status: AC | PRN
  Administered 2023-05-19: 100 mL via INTRAVENOUS

## 2023-05-25 ENCOUNTER — Inpatient Hospital Stay: Payer: BC Managed Care – PPO

## 2023-05-25 ENCOUNTER — Inpatient Hospital Stay: Payer: BC Managed Care – PPO | Admitting: Oncology

## 2023-05-25 ENCOUNTER — Encounter: Payer: Self-pay | Admitting: Oncology

## 2023-05-25 ENCOUNTER — Encounter: Payer: Self-pay | Admitting: *Deleted

## 2023-05-25 ENCOUNTER — Inpatient Hospital Stay: Payer: BC Managed Care – PPO | Attending: Oncology

## 2023-05-25 VITALS — BP 138/60 | HR 72 | Resp 18

## 2023-05-25 DIAGNOSIS — C787 Secondary malignant neoplasm of liver and intrahepatic bile duct: Secondary | ICD-10-CM | POA: Diagnosis not present

## 2023-05-25 DIAGNOSIS — K7460 Unspecified cirrhosis of liver: Secondary | ICD-10-CM | POA: Diagnosis not present

## 2023-05-25 DIAGNOSIS — Z5112 Encounter for antineoplastic immunotherapy: Secondary | ICD-10-CM | POA: Insufficient documentation

## 2023-05-25 DIAGNOSIS — C182 Malignant neoplasm of ascending colon: Secondary | ICD-10-CM

## 2023-05-25 DIAGNOSIS — D500 Iron deficiency anemia secondary to blood loss (chronic): Secondary | ICD-10-CM | POA: Diagnosis not present

## 2023-05-25 DIAGNOSIS — Z86718 Personal history of other venous thrombosis and embolism: Secondary | ICD-10-CM | POA: Diagnosis not present

## 2023-05-25 DIAGNOSIS — D6481 Anemia due to antineoplastic chemotherapy: Secondary | ICD-10-CM | POA: Insufficient documentation

## 2023-05-25 LAB — CBC WITH DIFFERENTIAL (CANCER CENTER ONLY)
Abs Immature Granulocytes: 0.02 10*3/uL (ref 0.00–0.07)
Basophils Absolute: 0 10*3/uL (ref 0.0–0.1)
Basophils Relative: 1 %
Eosinophils Absolute: 0.2 10*3/uL (ref 0.0–0.5)
Eosinophils Relative: 4 %
HCT: 32.5 % — ABNORMAL LOW (ref 36.0–46.0)
Hemoglobin: 10.3 g/dL — ABNORMAL LOW (ref 12.0–15.0)
Immature Granulocytes: 0 %
Lymphocytes Relative: 13 %
Lymphs Abs: 0.8 10*3/uL (ref 0.7–4.0)
MCH: 27.2 pg (ref 26.0–34.0)
MCHC: 31.7 g/dL (ref 30.0–36.0)
MCV: 86 fL (ref 80.0–100.0)
Monocytes Absolute: 0.7 10*3/uL (ref 0.1–1.0)
Monocytes Relative: 12 %
Neutro Abs: 4 10*3/uL (ref 1.7–7.7)
Neutrophils Relative %: 70 %
Platelet Count: 186 10*3/uL (ref 150–400)
RBC: 3.78 MIL/uL — ABNORMAL LOW (ref 3.87–5.11)
RDW: 15.2 % (ref 11.5–15.5)
WBC Count: 5.7 10*3/uL (ref 4.0–10.5)
nRBC: 0 % (ref 0.0–0.2)

## 2023-05-25 LAB — CMP (CANCER CENTER ONLY)
ALT: 9 U/L (ref 0–44)
AST: 15 U/L (ref 15–41)
Albumin: 3.8 g/dL (ref 3.5–5.0)
Alkaline Phosphatase: 98 U/L (ref 38–126)
Anion gap: 8 (ref 5–15)
BUN: 20 mg/dL (ref 8–23)
CO2: 25 mmol/L (ref 22–32)
Calcium: 9.3 mg/dL (ref 8.9–10.3)
Chloride: 106 mmol/L (ref 98–111)
Creatinine: 0.75 mg/dL (ref 0.44–1.00)
GFR, Estimated: >60 mL/min (ref >60)
Glucose, Bld: 96 mg/dL (ref 70–99)
Potassium: 4.4 mmol/L (ref 3.5–5.1)
Sodium: 139 mmol/L (ref 135–145)
Total Bilirubin: 0.3 mg/dL (ref 0.3–1.2)
Total Protein: 6.5 g/dL (ref 6.5–8.1)

## 2023-05-25 LAB — MAGNESIUM: Magnesium: 1.6 mg/dL — ABNORMAL LOW (ref 1.7–2.4)

## 2023-05-25 LAB — CEA (ACCESS): CEA (CHCC): 132.92 ng/mL — ABNORMAL HIGH (ref 0.00–5.00)

## 2023-05-25 MED ORDER — SODIUM CHLORIDE 0.9% FLUSH
10.0000 mL | INTRAVENOUS | Status: DC | PRN
  Administered 2023-05-25: 10 mL

## 2023-05-25 MED ORDER — SODIUM CHLORIDE 0.9 % IV SOLN: Freq: Once | INTRAVENOUS | Status: AC

## 2023-05-25 MED ORDER — SODIUM CHLORIDE 0.9 % IV SOLN
6.0000 mg/kg | Freq: Once | INTRAVENOUS | Status: AC
  Administered 2023-05-25: 400 mg via INTRAVENOUS
  Filled 2023-05-25: qty 20

## 2023-05-25 MED ORDER — HEPARIN SOD (PORK) LOCK FLUSH 100 UNIT/ML IV SOLN
500.0000 [IU] | Freq: Once | INTRAVENOUS | Status: AC | PRN
  Administered 2023-05-25: 500 [IU]

## 2023-05-25 NOTE — Patient Instructions (Signed)
[place] [facility] AT [facility]   Discharge Instructions: Thank you for choosing [facility] to provide your oncology and hematology care.   If you have a lab appointment with the Cancer Center, please go directly to the Cancer Center and check in at the registration area.   Wear comfortable clothing and clothing appropriate for easy access to any Portacath or PICC line.   We strive to give you quality time with your provider. You may need to reschedule your appointment if you arrive late (15 or more minutes).  Arriving late affects you and other patients whose appointments are after yours.  Also, if you miss three or more appointments without notifying the office, you may be dismissed from the clinic at the provider's discretion.      For prescription refill requests, have your pharmacy contact our office and allow 72 hours for refills to be completed.    Today you received the following chemotherapy and/or immunotherapy agents Panitumumab (VECTIBIX).      To help prevent nausea and vomiting after your treatment, we encourage you to take your nausea medication as directed.  BELOW ARE SYMPTOMS THAT SHOULD BE REPORTED IMMEDIATELY: *FEVER GREATER THAN 100.4 F (38 C) OR HIGHER *CHILLS OR SWEATING *NAUSEA AND VOMITING THAT IS NOT CONTROLLED WITH YOUR NAUSEA MEDICATION *UNUSUAL SHORTNESS OF BREATH *UNUSUAL BRUISING OR BLEEDING *URINARY PROBLEMS (pain or burning when urinating, or frequent urination) *BOWEL PROBLEMS (unusual diarrhea, constipation, pain near the anus) TENDERNESS IN MOUTH AND THROAT WITH OR WITHOUT PRESENCE OF ULCERS (sore throat, sores in mouth, or a toothache) UNUSUAL RASH, SWELLING OR PAIN  UNUSUAL VAGINAL DISCHARGE OR ITCHING   Items with * indicate a potential emergency and should be followed up as soon as possible or go to the Emergency Department if any problems should occur.  Please show the CHEMOTHERAPY ALERT CARD or IMMUNOTHERAPY ALERT  CARD at check-in to the Emergency Department and triage nurse.  Should you have questions after your visit or need to cancel or reschedule your appointment, please contact [place] [facility] AT [facility]  Dept: [phone]  and follow the prompts.  Office hours are 8:00 a.m. to 4:30 p.m. Monday - Friday. Please note that voicemails left after 4:00 p.m. may not be returned until the following business day.  We are closed weekends and major holidays. You have access to a nurse at all times for urgent questions. Please call the main number to the clinic Dept: [phone] and follow the prompts.   For any non-urgent questions, you may also contact your provider using MyChart. We now offer e-Visits for anyone 61 and older to request care online for non-urgent symptoms. For details visit [email or web address].   Also download the MyChart app! Go to the app store, search "MyChart", open the app, select [place], and log in with your MyChart username and password.  Panitumumab Injection What is this medication? PANITUMUMAB (pan i TOOM ue mab) treats colorectal cancer. It works by blocking a protein that causes cancer cells to grow and multiply. This helps to slow or stop the spread of cancer cells. It is a monoclonal antibody. This medicine may be used for other purposes; ask your health care provider or pharmacist if you have questions. COMMON BRAND NAME(S): Vectibix What should I tell my care team before I take this medication? They need to know if you have any of these conditions: Eye disease Low levels of magnesium in the blood Lung disease An unusual or allergic  reaction to panitumumab, other medications, foods, dyes, or preservatives Pregnant or trying to get pregnant Breast-feeding How should I use this medication? This medication is injected into a vein. It is given by your care team in a hospital or clinic setting. Talk to your care team about the use of this medication  in children. Special care may be needed. Overdosage: If you think you have taken too much of this medicine contact a poison control center or emergency room at once. NOTE: This medicine is only for you. Do not share this medicine with others. What if I miss a dose? Keep appointments for follow-up doses. It is important not to miss your dose. Call your care team if you are unable to keep an appointment. What may interact with this medication? Bevacizumab This list may not describe all possible interactions. Give your health care provider a list of all the medicines, herbs, non-prescription drugs, or dietary supplements you use. Also tell them if you smoke, drink alcohol, or use illegal drugs. Some items may interact with your medicine. What should I watch for while using this medication? Your condition will be monitored carefully while you are receiving this medication. This medication may make you feel generally unwell. This is not uncommon as chemotherapy can affect healthy cells as well as cancer cells. Report any side effects. Continue your course of treatment even though you feel ill unless your care team tells you to stop. This medication can make you more sensitive to the sun. Keep out of the sun while receiving this medication and for 2 months after stopping therapy. If you cannot avoid being in the sun, wear protective clothing and sunscreen. Do not use sun lamps, tanning beds, or tanning booths. Check with your care team if you have severe diarrhea, nausea, and vomiting or if you sweat a lot. The loss of too much body fluid may make it dangerous for you to take this medication. This medication may cause serious skin reactions. They can happen weeks to months after starting the medication. Contact your care team right away if you notice fevers or flu-like symptoms with a rash. The rash may be red or purple and then turn into blisters or peeling of the skin. You may also notice a red rash with  swelling of the face, lips, or lymph nodes in your neck or under your arms. Talk to your care team if you may be pregnant. Serious birth defects can occur if you take this medication during pregnancy and for 2 months after the last dose. Contraception is recommended while taking this medication and for 2 months after the last dose. Your care team can help you find the option that works for you. Do not breastfeed while taking this medication and for 2 months after the last dose. This medication may cause infertility. Talk to your care team if you are concerned about your fertility. What side effects may I notice from receiving this medication? Side effects that you should report to your care team as soon as possible: Allergic reactions--skin rash, itching, hives, swelling of the face, lips, tongue, or throat Dry cough, shortness of breath or trouble breathing Eye pain, redness, irritation, or discharge with blurry or decreased vision Infusion reactions--chest pain, shortness of breath or trouble breathing, feeling faint or lightheaded Low magnesium level--muscle pain or cramps, unusual weakness or fatigue, fast or irregular heartbeat, tremors Low potassium level--muscle pain or cramps, unusual weakness or fatigue, fast or irregular heartbeat, constipation Redness, blistering, peeling, or loosening  of the skin, including inside the mouth Skin reactions on sun-exposed areas Side effects that usually do not require medical attention (report to your care team if they continue or are bothersome): Change in nail shape, thickness, or color Diarrhea Dry skin Fatigue Nausea Vomiting This list may not describe all possible side effects. Call your doctor for medical advice about side effects. You may report side effects to FDA at 1-800-FDA-1088. Where should I keep my medication? This medication is given in a hospital or clinic. It will not be stored at home. NOTE: This sheet is a summary. It may not  cover all possible information. If you have questions about this medicine, talk to your doctor, pharmacist, or health care provider.  2024 Elsevier/Gold Standard (2021-12-14 00:00:00)

## 2023-05-25 NOTE — Progress Notes (Signed)
[facility] OFFICE PROGRESS NOTE   Diagnosis: Colon cancer  INTERVAL HISTORY:   Stacy Burns returns as scheduled.  She completed another treatment of panitumumab on 05/11/2023.  She continues encorafenib.  She feels well.  No diarrhea.  No chest pain.  She is working.  Objective:  Vital signs in last 24 hours:  Blood pressure 116/60, pulse 76, temperature 98.1 F (36.7 C), temperature source Temporal, resp. rate 18, height 5\' 7"  (1.702 m), weight 144 lb (65.3 kg), SpO2 100%.    HEENT: No thrush or ulcers Resp: Lungs clear bilaterally Cardio: Regular rate and rhythm GI: No hepatosplenomegaly, slight fullness in the right lower abdomen with mild tenderness Vascular: No leg edema  Skin: Mild acne type rash over the face, ecchymoses over the arms  Portacath/PICC-without erythema  Lab Results:  Lab Results  Component Value Date   WBC 5.7 05/25/2023   HGB 10.3 (L) 05/25/2023   HCT 32.5 (L) 05/25/2023   MCV 86.0 05/25/2023   PLT 186 05/25/2023   NEUTROABS 4.0 05/25/2023    CMP  Lab Results  Component Value Date   NA 141 05/11/2023   K 3.8 05/11/2023   CL 110 05/11/2023   CO2 24 05/11/2023   GLUCOSE 114 (H) 05/11/2023   BUN 32 (H) 05/11/2023   CREATININE 0.70 05/11/2023   CALCIUM 9.2 05/11/2023   PROT 6.4 (L) 05/11/2023   ALBUMIN 3.7 05/11/2023   AST 14 (L) 05/11/2023   ALT 9 05/11/2023   ALKPHOS 102 05/11/2023   BILITOT 0.3 05/11/2023   GFRNONAA >60 05/11/2023   GFRAA >60 05/06/2020    Lab Results  Component Value Date   CEA1 54.57 (H) 12/30/2020   CEA 116.07 (H) 05/11/2023    Medications: I have reviewed the patient's current medications.   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  Cirrhosis.  Innumerable liver lesions. Colonoscopy 12/18/2019-ulcerated partially obstructing large mass in the mid ascending colon.  Biopsy  invasive adenocarcinoma; preserved expression major MMR proteins Upper endoscopy 12/18/2019-normal esophagus, stomach, examined duodenum CEA 12/19/2019-8,923 Biopsy liver lesion 12/23/2019-adenocarcinoma Foundation 1-K-ras wild-type; tumor mutation burden greater than or equal to 10; microsatellite stable; BRAF V600E Cycle 1 FOLFOX 01/01/2020 Cycle 2 FOLFOX 01/15/2020  Cycle 3 FOLFOX 01/29/2020, Udenyca added Cycle 4 FOLFOX 02/12/2020, Udenyca held Cycle 5 FOLFOX 02/26/2020, Udenyca Cycle 6 FOLFOX 03/11/2020, Udenyca held CT abdomen/pelvis 03/16/2020-stable diffuse liver lesions, stable strictured appearing ascending colon, possible pericolonic implant, vague left lower lobe nodule Cycle 1 FOLFIRI/Avastin 04/06/2020 Cycle 2 FOLFIRI/Avastin 04/21/2020 Cycle 3 FOLFIRI/Avastin 05/06/2020 Cycle 4 FOLFIRI/Avastin 05/20/2020 Cycle 5 FOLFIRI/Avastin 06/03/2020 CTs 06/15/2020-mild to moderate improvement in hepatic metastasis.  No new or progressive disease.  Narrowing within the proximal ascending colon with upstream mild cecal and terminal ileum dilatation, similar. Cycle 6 FOLFIRI/Avastin 06/17/2020 Cycle 7 FOLFIRI/Avastin 07/01/2020 Cycle 8 FOLFIRI/Avastin 07/15/2020 Cycle 9 FOLFIRI/Avastin 07/29/2020-5-FU bolus eliminated, 5-FU infusion and irinotecan dose reduced Cycle 10 FOLFIRI/Avastin 08/12/2020 CTs 08/23/2020- mild residual wall thickening involving the cecum/ascending colon.  Numerous liver metastases improved. Cycle 11 FOLFIRI/Avastin 08/25/2020 Cycle 12 FOLFIRI/Avastin 09/16/2020 Cycle 13 FOLFIRI/Avastin 10/07/2020 Cycle 14 FOLFIRI/Avastin 10/28/2020 Cycle 15 FOLFIRI/Avastin 11/18/2020 CT abdomen/pelvis 12/06/2020-mild improvement in multifocal hepatic metastases, wall thickening at the ascending colon with pericolonic inflammatory changes Cycle 16 FOLFIRI/Avastin 12/09/2020 Cycle 17 FOLFIRI/Avastin 12/30/2020 Cycle 18 FOLFIRI/Avastin 01/20/2021 Cycle 19 FOLFIRI/Avastin 02/10/2021 Cycle 20 FOLFIRI/Avastin  03/03/2021 Cycle 21 FOLFIRI/Avastin 03/24/2021 Cycle 22 FOLFIRI/Avastin 04/14/2021 CTs 05/04/2021-slight improvement  in hepatic metastatic disease. Cycle 23 FOLFIRI/Avastin 05/05/2021 Cycle 24 FOLFIRI/Avastin 05/25/2021 Cycle 25 FOLFIRI/Avastin 06/15/2021 Cycle 26 FOLFIRI/Avastin 07/13/2021 Cycle 27 FOLFIRI/Avastin 08/02/2021 CTs 08/30/2021-majority of liver lesions are unchanged in size, 1 lesion adjacent to the gallbladder has increased, no adenopathy, persistent pericolonic stranding and peritoneal thickening at the cecum Progressive rise in CEA 08/31/2021-treatment changed to Xeloda/bevacizumab, began [place] 09/22/2021 CEA stable 11/03/2021 Xeloda/bevacizumab continued CTs 01/10/2022-increase in size of some of the liver lesions, others are stable, new tiny right lower lobe nodule, increased dilation of distal small bowel with increased wall thickening at the terminal ileum and ileocecal valve with 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, stable bilateral paracolic gutter peritoneal thickening Panitumumab/ Encorafenib 01/12/2022 (encorafenib 01/19/2022) Panitumumab 02/02/2022 Panitumumab 02/20/2022 Panitumumab 03/06/2022 Panitumumab 03/20/2022 Panitumumab 04/03/2022 CTs 04/14/2022-decrease size of liver metastases, new borderline subcarinal adenopathy, improved peritoneal thickening, cirrhosis or pseudocirrhosis, persistent dilated cecum Encorafenib and Panitumumab continued Panitumumab 04/18/2022 Panitumumab 05/05/2022 Panitumumab 05/19/2022 Panitumumab 06/02/2022 Panitumumab 06/16/2022, 4g IV mag and begin oral mag po once daily  Panitumumab 06/30/2022 Panitumumab 07/14/2022, 2 g IV magnesium, increase oral magnesium to 400 mg twice daily Panitumumab 07/28/2022, 2 g IV magnesium CTs 07/28/2022-no change in hepatic metastases, no new signs of metastatic disease, persistent mural thickening at the terminal ileum/cecum mild splenomegaly unchanged Panitumumab 08/18/2022 Panitumumab  09/01/2022 Panitumumab 09/15/2022 Panitumumab 09/29/2022 Panitumumab 10/13/2022 Panitumumab 10/27/2022 CTs 11/03/2022-unchanged hepatic metastases, no new lesions, 3.7 cm soft tissue density at the medial left kidney etiology unclear, improvement in wall thickening at the ileocecal junction development of appendiceal dilatation Panitumumab 11/10/2022 Panitumumab 11/24/2022 Panitumumab 12/08/2022 Panitumumab 12/22/2022 Panitumumab 01/05/2023 Panitumumab 02/02/2023 Panitumumab 02/16/2023 CTs 03/01/2023-unchanged cecal mass. Enlargement of left liver lesion, other liver lesions unchanged. Near complete resolution of soft tissue left renal pelvis. No pericardial effusion. Panitumumab 03/02/2023 Panitumumab 03/16/2023 Panitumumab 03/30/2023 Panitumumab 04/13/2023 Panitumumab 04/27/2023 Panitumumab 05/11/2023 CTs 05/19/2023-no evidence of metastatic disease in the chest, progressive porta hepatis adenopathy and enlargement of a coalescent left liver lesion, possible mildly enlarging left liver lesion, right colon mass less obvious-slightly improved? Panitumumab 05/25/2023 Anemia secondary to GI blood loss, and chemotherapy, on oral iron BID  Red cell transfusion 02/02/2023 Cirrhosis 12/26/2019-hospital admission for right lower extremity DVT and GI bleed, treated with heparin anticoagulation beginning 12/26/2019, converted to Lovenox at discharge 12/29/2019 Lovenox reduced to 40 mg daily 02/10/2021 secondary to bruising and nosebleeding History of low-grade fever-likely "tumor" fever COVID-19 infection January 2021 Admission 01/19/2023 with dyspnea/orthopnea, pericardial effusion, EKG changes suggestive of pericarditis Echocardiogram 01/20/2023-LVEF 60-65%, small to moderate circumferential pericardial effusion without tamponade physiology Echocardiogram 02/19/2022-LVEF 70-75%; trivial pericardial effusion. Anemia secondary to chemotherapy, chronic disease, and phlebotomy 2 units of packed red blood cells 02/03/2023      Disposition: Ms. Nissley appears stable.  The CEA has been slightly higher over the past month.  I reviewed the CT findings and images with Ms. Manson Passey.  She appears to have slow and mild progression of the metastatic colon cancer.  We discussed treatment options including continuing panitumumab/encorafenib, going back to FOLFOX or FOLFIRI based therapy, pembrolizumab, and referral for a clinical trial.  Ms. Weins is comfortable continuing the panitumumab/encorafenib for now.  We will plan for a restaging CT in 2 months.  Thornton Papas, MD  05/25/2023  8:08 AM

## 2023-05-25 NOTE — Progress Notes (Signed)
Patient seen by Dr. Thornton Papas today  Vitals are within treatment parameters:Yes   Labs are within treatment parameters: Yes No intervention needed for Mg+ 1.6  Treatment plan has been signed: Yes   Per physician team, Patient is ready for treatment and there are NO modifications to the treatment plan.

## 2023-06-08 ENCOUNTER — Inpatient Hospital Stay: Payer: BC Managed Care – PPO

## 2023-06-08 ENCOUNTER — Encounter: Payer: Self-pay | Admitting: Nurse Practitioner

## 2023-06-08 ENCOUNTER — Inpatient Hospital Stay: Payer: BC Managed Care – PPO | Admitting: Nurse Practitioner

## 2023-06-08 ENCOUNTER — Other Ambulatory Visit: Payer: Self-pay

## 2023-06-08 VITALS — BP 123/55 | HR 65 | Temp 97.6°F | Resp 18 | Wt 141.9 lb

## 2023-06-08 VITALS — BP 142/58 | HR 82

## 2023-06-08 DIAGNOSIS — C182 Malignant neoplasm of ascending colon: Secondary | ICD-10-CM | POA: Diagnosis not present

## 2023-06-08 DIAGNOSIS — Z5112 Encounter for antineoplastic immunotherapy: Secondary | ICD-10-CM | POA: Diagnosis not present

## 2023-06-08 LAB — CMP (CANCER CENTER ONLY)
ALT: 8 U/L (ref 0–44)
AST: 12 U/L — ABNORMAL LOW (ref 15–41)
Albumin: 3.5 g/dL (ref 3.5–5.0)
Alkaline Phosphatase: 100 U/L (ref 38–126)
Anion gap: 7 (ref 5–15)
BUN: 17 mg/dL (ref 8–23)
CO2: 26 mmol/L (ref 22–32)
Calcium: 8.5 mg/dL — ABNORMAL LOW (ref 8.9–10.3)
Chloride: 106 mmol/L (ref 98–111)
Creatinine: 0.64 mg/dL (ref 0.44–1.00)
GFR, Estimated: >60 mL/min (ref >60)
Glucose, Bld: 82 mg/dL (ref 70–99)
Potassium: 3.9 mmol/L (ref 3.5–5.1)
Sodium: 139 mmol/L (ref 135–145)
Total Bilirubin: 0.4 mg/dL (ref 0.3–1.2)
Total Protein: 6.6 g/dL (ref 6.5–8.1)

## 2023-06-08 LAB — CBC WITH DIFFERENTIAL (CANCER CENTER ONLY)
Abs Immature Granulocytes: 0.02 10*3/uL (ref 0.00–0.07)
Basophils Absolute: 0 10*3/uL (ref 0.0–0.1)
Basophils Relative: 1 %
Eosinophils Absolute: 0.2 10*3/uL (ref 0.0–0.5)
Eosinophils Relative: 3 %
HCT: 30.9 % — ABNORMAL LOW (ref 36.0–46.0)
Hemoglobin: 10 g/dL — ABNORMAL LOW (ref 12.0–15.0)
Immature Granulocytes: 0 %
Lymphocytes Relative: 15 %
Lymphs Abs: 0.8 10*3/uL (ref 0.7–4.0)
MCH: 27.7 pg (ref 26.0–34.0)
MCHC: 32.4 g/dL (ref 30.0–36.0)
MCV: 85.6 fL (ref 80.0–100.0)
Monocytes Absolute: 0.7 10*3/uL (ref 0.1–1.0)
Monocytes Relative: 13 %
Neutro Abs: 3.8 10*3/uL (ref 1.7–7.7)
Neutrophils Relative %: 68 %
Platelet Count: 202 10*3/uL (ref 150–400)
RBC: 3.61 MIL/uL — ABNORMAL LOW (ref 3.87–5.11)
RDW: 14.6 % (ref 11.5–15.5)
WBC Count: 5.6 10*3/uL (ref 4.0–10.5)
nRBC: 0 % (ref 0.0–0.2)

## 2023-06-08 LAB — MAGNESIUM: Magnesium: 1.4 mg/dL — ABNORMAL LOW (ref 1.7–2.4)

## 2023-06-08 LAB — CEA (ACCESS): CEA (CHCC): 97.95 ng/mL — ABNORMAL HIGH (ref 0.00–5.00)

## 2023-06-08 MED ORDER — SODIUM CHLORIDE 0.9% FLUSH
10.0000 mL | INTRAVENOUS | Status: DC | PRN
  Administered 2023-06-08: 10 mL

## 2023-06-08 MED ORDER — MAGNESIUM SULFATE 2 GM/50ML IV SOLN
2.0000 g | Freq: Once | INTRAVENOUS | Status: AC
Start: 2023-06-08 — End: 2023-06-08
  Administered 2023-06-08: 2 g via INTRAVENOUS
  Filled 2023-06-08: qty 50

## 2023-06-08 MED ORDER — HEPARIN SOD (PORK) LOCK FLUSH 100 UNIT/ML IV SOLN
500.0000 [IU] | Freq: Once | INTRAVENOUS | Status: AC | PRN
  Administered 2023-06-08: 500 [IU]

## 2023-06-08 MED ORDER — SODIUM CHLORIDE 0.9 % IV SOLN
6.0000 mg/kg | Freq: Once | INTRAVENOUS | Status: AC
  Administered 2023-06-08: 400 mg via INTRAVENOUS
  Filled 2023-06-08: qty 20

## 2023-06-08 MED ORDER — SODIUM CHLORIDE 0.9 % IV SOLN: Freq: Once | INTRAVENOUS | Status: AC

## 2023-06-08 NOTE — Progress Notes (Signed)
[facility] OFFICE PROGRESS NOTE   Diagnosis: Colon cancer  INTERVAL HISTORY:   Ms. Pilgreen returns as scheduled.  She continues Panitumumab/encorafenib.  He denies nausea/vomiting.  No mouth sores.  No diarrhea.  No rash.  She notes skin is dry.  No rash.   Objective:  Vital signs in last 24 hours:  Blood pressure (!) 123/55, pulse 65, temperature 97.6 F (36.4 C), temperature source Temporal, resp. rate 18, weight 141 lb 14.4 oz (64.4 kg), SpO2 100%.    HEENT: No thrush or ulcers. Resp: Lungs clear bilaterally. Cardio: Regular rate and rhythm. GI: No hepatosplenomegaly. Vascular: No leg edema. Skin: Mild acne type rash on the face.  Ecchymoses over the forearms. Port-A-Cath without erythema.  Lab Results:  Lab Results  Component Value Date   WBC 5.6 06/08/2023   HGB 10.0 (L) 06/08/2023   HCT 30.9 (L) 06/08/2023   MCV 85.6 06/08/2023   PLT 202 06/08/2023   NEUTROABS 3.8 06/08/2023    Imaging:  No results found.  Medications: I have reviewed the patient's current medications.  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  Cirrhosis.  Innumerable liver lesions. Colonoscopy 12/18/2019-ulcerated partially obstructing large mass in the mid ascending colon.  Biopsy invasive adenocarcinoma; preserved expression major MMR proteins Upper endoscopy 12/18/2019-normal esophagus, stomach, examined duodenum CEA 12/19/2019-8,923 Biopsy liver lesion 12/23/2019-adenocarcinoma Foundation 1-K-ras wild-type; tumor mutation burden greater than or equal to 10; microsatellite stable; BRAF V600E Cycle 1 FOLFOX 01/01/2020 Cycle 2 FOLFOX 01/15/2020  Cycle 3 FOLFOX 01/29/2020, Udenyca added Cycle 4 FOLFOX 02/12/2020, Udenyca held Cycle 5 FOLFOX 02/26/2020, Udenyca Cycle 6 FOLFOX 03/11/2020, Udenyca held CT abdomen/pelvis 03/16/2020-stable diffuse liver lesions, stable  strictured appearing ascending colon, possible pericolonic implant, vague left lower lobe nodule Cycle 1 FOLFIRI/Avastin 04/06/2020 Cycle 2 FOLFIRI/Avastin 04/21/2020 Cycle 3 FOLFIRI/Avastin 05/06/2020 Cycle 4 FOLFIRI/Avastin 05/20/2020 Cycle 5 FOLFIRI/Avastin 06/03/2020 CTs 06/15/2020-mild to moderate improvement in hepatic metastasis.  No new or progressive disease.  Narrowing within the proximal ascending colon with upstream mild cecal and terminal ileum dilatation, similar. Cycle 6 FOLFIRI/Avastin 06/17/2020 Cycle 7 FOLFIRI/Avastin 07/01/2020 Cycle 8 FOLFIRI/Avastin 07/15/2020 Cycle 9 FOLFIRI/Avastin 07/29/2020-5-FU bolus eliminated, 5-FU infusion and irinotecan dose reduced Cycle 10 FOLFIRI/Avastin 08/12/2020 CTs 08/23/2020- mild residual wall thickening involving the cecum/ascending colon.  Numerous liver metastases improved. Cycle 11 FOLFIRI/Avastin 08/25/2020 Cycle 12 FOLFIRI/Avastin 09/16/2020 Cycle 13 FOLFIRI/Avastin 10/07/2020 Cycle 14 FOLFIRI/Avastin 10/28/2020 Cycle 15 FOLFIRI/Avastin 11/18/2020 CT abdomen/pelvis 12/06/2020-mild improvement in multifocal hepatic metastases, wall thickening at the ascending colon with pericolonic inflammatory changes Cycle 16 FOLFIRI/Avastin 12/09/2020 Cycle 17 FOLFIRI/Avastin 12/30/2020 Cycle 18 FOLFIRI/Avastin 01/20/2021 Cycle 19 FOLFIRI/Avastin 02/10/2021 Cycle 20 FOLFIRI/Avastin 03/03/2021 Cycle 21 FOLFIRI/Avastin 03/24/2021 Cycle 22 FOLFIRI/Avastin 04/14/2021 CTs 05/04/2021-slight improvement in hepatic metastatic disease. Cycle 23 FOLFIRI/Avastin 05/05/2021 Cycle 24 FOLFIRI/Avastin 05/25/2021 Cycle 25 FOLFIRI/Avastin 06/15/2021 Cycle 26 FOLFIRI/Avastin 07/13/2021 Cycle 27 FOLFIRI/Avastin 08/02/2021 CTs 08/30/2021-majority of liver lesions are unchanged in size, 1 lesion adjacent to the gallbladder has increased, no adenopathy, persistent pericolonic stranding and peritoneal thickening at the cecum Progressive rise in CEA 08/31/2021-treatment changed to  Xeloda/bevacizumab, began [place] 09/22/2021 CEA stable 11/03/2021 Xeloda/bevacizumab continued CTs 01/10/2022-increase in size of some of the liver lesions, others are stable, new tiny right lower lobe nodule, increased dilation of distal small bowel with increased wall thickening at the terminal ileum and ileocecal valve with 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, stable bilateral paracolic gutter peritoneal thickening Panitumumab/ Encorafenib 01/12/2022 (encorafenib 01/19/2022)  Panitumumab 02/02/2022 Panitumumab 02/20/2022 Panitumumab 03/06/2022 Panitumumab 03/20/2022 Panitumumab 04/03/2022 CTs 04/14/2022-decrease size of liver metastases, new borderline subcarinal adenopathy, improved peritoneal thickening, cirrhosis or pseudocirrhosis, persistent dilated cecum Encorafenib and Panitumumab continued Panitumumab 04/18/2022 Panitumumab 05/05/2022 Panitumumab 05/19/2022 Panitumumab 06/02/2022 Panitumumab 06/16/2022, 4g IV mag and begin oral mag po once daily  Panitumumab 06/30/2022 Panitumumab 07/14/2022, 2 g IV magnesium, increase oral magnesium to 400 mg twice daily Panitumumab 07/28/2022, 2 g IV magnesium CTs 07/28/2022-no change in hepatic metastases, no new signs of metastatic disease, persistent mural thickening at the terminal ileum/cecum mild splenomegaly unchanged Panitumumab 08/18/2022 Panitumumab 09/01/2022 Panitumumab 09/15/2022 Panitumumab 09/29/2022 Panitumumab 10/13/2022 Panitumumab 10/27/2022 CTs 11/03/2022-unchanged hepatic metastases, no new lesions, 3.7 cm soft tissue density at the medial left kidney etiology unclear, improvement in wall thickening at the ileocecal junction development of appendiceal dilatation Panitumumab 11/10/2022 Panitumumab 11/24/2022 Panitumumab 12/08/2022 Panitumumab 12/22/2022 Panitumumab 01/05/2023 Panitumumab 02/02/2023 Panitumumab 02/16/2023 CTs 03/01/2023-unchanged cecal mass. Enlargement of left liver lesion, other liver lesions unchanged. Near complete  resolution of soft tissue left renal pelvis. No pericardial effusion. Panitumumab 03/02/2023 Panitumumab 03/16/2023 Panitumumab 03/30/2023 Panitumumab 04/13/2023 Panitumumab 04/27/2023 Panitumumab 05/11/2023 CTs 05/19/2023-no evidence of metastatic disease in the chest, progressive porta hepatis adenopathy and enlargement of a coalescent left liver lesion, possible mildly enlarging left liver lesion, right colon mass less obvious-slightly improved? Panitumumab 05/25/2023, Encorafenib continued Panitumumab 06/08/2023 Anemia secondary to GI blood loss, and chemotherapy, on oral iron BID  Red cell transfusion 02/02/2023 Cirrhosis 12/26/2019-hospital admission for right lower extremity DVT and GI bleed, treated with heparin anticoagulation beginning 12/26/2019, converted to Lovenox at discharge 12/29/2019 Lovenox reduced to 40 mg daily 02/10/2021 secondary to bruising and nosebleeding History of low-grade fever-likely "tumor" fever COVID-19 infection January 2021 Admission 01/19/2023 with dyspnea/orthopnea, pericardial effusion, EKG changes suggestive of pericarditis Echocardiogram 01/20/2023-LVEF 60-65%, small to moderate circumferential pericardial effusion without tamponade physiology Echocardiogram 02/19/2022-LVEF 70-75%; trivial pericardial effusion. Anemia secondary to chemotherapy, chronic disease, and phlebotomy 2 units of packed red blood cells 02/03/2023    Disposition: Stacy Burns appears stable.  She continues encorafenib and bevacizumab.  She is tolerating well.  There is no clinical evidence of disease progression.  CEA is better.  CBC and chemistry panel reviewed.  Labs adequate to continue with treatment as above.  Magnesium is low.  She will receive IV magnesium 2 g today.  She will return for follow-up and treatment as scheduled in 2 weeks.  She will contact the office in the interim with any problems.    Lonna Cobb ANP/GNP-BC   06/08/2023  9:37 AM

## 2023-06-08 NOTE — Patient Instructions (Signed)
[facility]   Discharge Instructions: Thank you for choosing [facility] to provide your oncology and hematology care.   If you have a lab appointment with the Cancer Center, please go directly to the Cancer Center and check in at the registration area.   Wear comfortable clothing and clothing appropriate for easy access to any Portacath or PICC line.   We strive to give you quality time with your provider. You may need to reschedule your appointment if you arrive late (15 or more minutes).  Arriving late affects you and other patients whose appointments are after yours.  Also, if you miss three or more appointments without notifying the office, you may be dismissed from the clinic at the provider's discretion.      For prescription refill requests, have your pharmacy contact our office and allow 72 hours for refills to be completed.    Today you received the following chemotherapy and/or immunotherapy agents Vectibix/Magnesium       To help prevent nausea and vomiting after your treatment, we encourage you to take your nausea medication as directed.  BELOW ARE SYMPTOMS THAT SHOULD BE REPORTED IMMEDIATELY: *FEVER GREATER THAN 100.4 F (38 C) OR HIGHER *CHILLS OR SWEATING *NAUSEA AND VOMITING THAT IS NOT CONTROLLED WITH YOUR NAUSEA MEDICATION *UNUSUAL SHORTNESS OF BREATH *UNUSUAL BRUISING OR BLEEDING *URINARY PROBLEMS (pain or burning when urinating, or frequent urination) *BOWEL PROBLEMS (unusual diarrhea, constipation, pain near the anus) TENDERNESS IN MOUTH AND THROAT WITH OR WITHOUT PRESENCE OF ULCERS (sore throat, sores in mouth, or a toothache) UNUSUAL RASH, SWELLING OR PAIN  UNUSUAL VAGINAL DISCHARGE OR ITCHING   Items with * indicate a potential emergency and should be followed up as soon as possible or go to the Emergency Department if any problems should occur.  Please show the CHEMOTHERAPY ALERT CARD or IMMUNOTHERAPY ALERT  CARD at check-in to the Emergency Department and triage nurse.  Should you have questions after your visit or need to cancel or reschedule your appointment, please contact [facility]  Dept: [phone]  and follow the prompts.  Office hours are 8:00 a.m. to 4:30 p.m. Monday - Friday. Please note that voicemails left after 4:00 p.m. may not be returned until the following business day.  We are closed weekends and major holidays. You have access to a nurse at all times for urgent questions. Please call the main number to the clinic Dept: [phone] and follow the prompts.   For any non-urgent questions, you may also contact your provider using MyChart. We now offer e-Visits for anyone 25 and older to request care online for non-urgent symptoms. For details visit [email or web address].   Also download the MyChart app! Go to the app store, search "MyChart", open the app, select [place], and log in with your MyChart username and password.  Panitumumab Injection What is this medication? PANITUMUMAB (pan i TOOM ue mab) treats colorectal cancer. It works by blocking a protein that causes cancer cells to grow and multiply. This helps to slow or stop the spread of cancer cells. It is a monoclonal antibody. This medicine may be used for other purposes; ask your health care provider or pharmacist if you have questions. COMMON BRAND NAME(S): Vectibix What should I tell my care team before I take this medication? They need to know if you have any of these conditions: Eye disease Low levels of magnesium in the blood Lung disease An unusual or allergic  reaction to panitumumab, other medications, foods, dyes, or preservatives Pregnant or trying to get pregnant Breast-feeding How should I use this medication? This medication is injected into a vein. It is given by your care team in a hospital or clinic setting. Talk to your care team about the use of this medication  in children. Special care may be needed. Overdosage: If you think you have taken too much of this medicine contact a poison control center or emergency room at once. NOTE: This medicine is only for you. Do not share this medicine with others. What if I miss a dose? Keep appointments for follow-up doses. It is important not to miss your dose. Call your care team if you are unable to keep an appointment. What may interact with this medication? Bevacizumab This list may not describe all possible interactions. Give your health care provider a list of all the medicines, herbs, non-prescription drugs, or dietary supplements you use. Also tell them if you smoke, drink alcohol, or use illegal drugs. Some items may interact with your medicine. What should I watch for while using this medication? Your condition will be monitored carefully while you are receiving this medication. This medication may make you feel generally unwell. This is not uncommon as chemotherapy can affect healthy cells as well as cancer cells. Report any side effects. Continue your course of treatment even though you feel ill unless your care team tells you to stop. This medication can make you more sensitive to the sun. Keep out of the sun while receiving this medication and for 2 months after stopping therapy. If you cannot avoid being in the sun, wear protective clothing and sunscreen. Do not use sun lamps, tanning beds, or tanning booths. Check with your care team if you have severe diarrhea, nausea, and vomiting or if you sweat a lot. The loss of too much body fluid may make it dangerous for you to take this medication. This medication may cause serious skin reactions. They can happen weeks to months after starting the medication. Contact your care team right away if you notice fevers or flu-like symptoms with a rash. The rash may be red or purple and then turn into blisters or peeling of the skin. You may also notice a red rash with  swelling of the face, lips, or lymph nodes in your neck or under your arms. Talk to your care team if you may be pregnant. Serious birth defects can occur if you take this medication during pregnancy and for 2 months after the last dose. Contraception is recommended while taking this medication and for 2 months after the last dose. Your care team can help you find the option that works for you. Do not breastfeed while taking this medication and for 2 months after the last dose. This medication may cause infertility. Talk to your care team if you are concerned about your fertility. What side effects may I notice from receiving this medication? Side effects that you should report to your care team as soon as possible: Allergic reactions--skin rash, itching, hives, swelling of the face, lips, tongue, or throat Dry cough, shortness of breath or trouble breathing Eye pain, redness, irritation, or discharge with blurry or decreased vision Infusion reactions--chest pain, shortness of breath or trouble breathing, feeling faint or lightheaded Low magnesium level--muscle pain or cramps, unusual weakness or fatigue, fast or irregular heartbeat, tremors Low potassium level--muscle pain or cramps, unusual weakness or fatigue, fast or irregular heartbeat, constipation Redness, blistering, peeling, or loosening  of the skin, including inside the mouth Skin reactions on sun-exposed areas Side effects that usually do not require medical attention (report to your care team if they continue or are bothersome): Change in nail shape, thickness, or color Diarrhea Dry skin Fatigue Nausea Vomiting This list may not describe all possible side effects. Call your doctor for medical advice about side effects. You may report side effects to FDA at 1-800-FDA-1088. Where should I keep my medication? This medication is given in a hospital or clinic. It will not be stored at home. NOTE: This sheet is a summary. It may not  cover all possible information. If you have questions about this medicine, talk to your doctor, pharmacist, or health care provider.  2024 Elsevier/Gold Standard (2021-12-14 00:00:00) Hypomagnesemia Hypomagnesemia is a condition in which the level of magnesium in the blood is too low. Magnesium is a mineral that is found in many foods. It is used in many different processes in the body. Hypomagnesemia can affect every organ in the body. In severe cases, it can cause life-threatening problems. What are the causes? This condition may be caused by: Not getting enough magnesium in your diet or not having enough healthy foods to eat (malnutrition). Problems with magnesium absorption in the intestines. Dehydration. Excessive use of alcohol. Vomiting. Severe or long-term (chronic) diarrhea. Some medicines, including medicines that make you urinate more often (diuretics). Certain diseases, such as kidney disease, diabetes, celiac disease, and overactive thyroid. What are the signs or symptoms? Symptoms of this condition include: Loss of appetite, nausea, and vomiting. Involuntary shaking or trembling of a body part (tremor). Muscle weakness or tingling in the arms and legs. Sudden tightening of muscles (muscle spasms). Confusion. Psychiatric issues, such as: Depression and irritability. Psychosis. A feeling of fluttering of the heart (palpitations). Seizures. These symptoms are more severe if magnesium levels drop suddenly. How is this diagnosed? This condition may be diagnosed based on: Your symptoms and medical history. A physical exam. Blood and urine tests. How is this treated? Treatment depends on the cause and the severity of the condition. It may be treated by: Taking a magnesium supplement. This can be taken in pill form. If the condition is severe, magnesium is usually given through an IV. Making changes to your diet. You may be directed to eat foods that have a lot of magnesium,  such as green leafy vegetables, peas, beans, and nuts. Not drinking alcohol. If you are struggling not to drink, ask your health care provider for help. Follow these instructions at home: Eating and drinking     Make sure that your diet includes foods with magnesium. Foods that have a lot of magnesium in them include: Green leafy vegetables, such as spinach and broccoli. Beans and peas. Nuts and seeds, such as almonds and sunflower seeds. Whole grains, such as whole grain bread and fortified cereals. Drink fluids that contain salts and minerals (electrolytes), such as sports drinks, when you are active. Do not drink alcohol. General instructions Take over-the-counter and prescription medicines only as told by your health care provider. Take magnesium supplements as directed if your health care provider tells you to take them. Have your magnesium levels monitored as told by your health care provider. Keep all follow-up visits. This is important. Contact a health care provider if: You get worse instead of better. Your symptoms return. Get help right away if: You develop severe muscle weakness. You have trouble breathing. You feel that your heart is racing. These symptoms may represent  a serious problem that is an emergency. Do not wait to see if the symptoms will go away. Get medical help right away. Call your local emergency services (911 in the [place] Do not drive yourself to the hospital. Summary Hypomagnesemia is a condition in which the level of magnesium in the blood is too low. Hypomagnesemia can affect every organ in the body. Treatment may include eating more foods that contain magnesium, taking magnesium supplements, and not drinking alcohol. Have your magnesium levels monitored as told by your health care provider. This information is not intended to replace advice given to you by your health care provider. Make sure you discuss any questions you have with your health care  provider. Document Revised: 12/28/2020 Document Reviewed: 12/28/2020 Elsevier Patient Education  2024 [employer].

## 2023-06-08 NOTE — Progress Notes (Signed)
Patient seen by Lonna Cobb NP today  Vitals are within treatment parameters:Yes   Labs are within treatment parameters: No (Please specify and give further instructions.) Mag 1.4  Treatment plan has been signed: Yes   Per physician team, Patient is ready for treatment. Please note the following modifications: adding 2g of IV mag.

## 2023-06-21 ENCOUNTER — Inpatient Hospital Stay: Payer: BC Managed Care – PPO | Admitting: Nurse Practitioner

## 2023-06-21 ENCOUNTER — Inpatient Hospital Stay: Payer: BC Managed Care – PPO

## 2023-06-26 ENCOUNTER — Inpatient Hospital Stay: Payer: BC Managed Care – PPO

## 2023-06-26 ENCOUNTER — Encounter: Payer: Self-pay | Admitting: Nurse Practitioner

## 2023-06-26 ENCOUNTER — Inpatient Hospital Stay (HOSPITAL_BASED_OUTPATIENT_CLINIC_OR_DEPARTMENT_OTHER): Payer: BC Managed Care – PPO | Admitting: Nurse Practitioner

## 2023-06-26 ENCOUNTER — Inpatient Hospital Stay: Payer: BC Managed Care – PPO | Attending: Oncology

## 2023-06-26 VITALS — BP 128/64 | HR 84 | Temp 98.1°F | Resp 18 | Ht 67.0 in | Wt 140.2 lb

## 2023-06-26 DIAGNOSIS — C787 Secondary malignant neoplasm of liver and intrahepatic bile duct: Secondary | ICD-10-CM | POA: Insufficient documentation

## 2023-06-26 DIAGNOSIS — D500 Iron deficiency anemia secondary to blood loss (chronic): Secondary | ICD-10-CM | POA: Insufficient documentation

## 2023-06-26 DIAGNOSIS — K7460 Unspecified cirrhosis of liver: Secondary | ICD-10-CM | POA: Diagnosis not present

## 2023-06-26 DIAGNOSIS — D6481 Anemia due to antineoplastic chemotherapy: Secondary | ICD-10-CM | POA: Insufficient documentation

## 2023-06-26 DIAGNOSIS — C182 Malignant neoplasm of ascending colon: Secondary | ICD-10-CM

## 2023-06-26 DIAGNOSIS — Z23 Encounter for immunization: Secondary | ICD-10-CM | POA: Insufficient documentation

## 2023-06-26 DIAGNOSIS — Z5112 Encounter for antineoplastic immunotherapy: Secondary | ICD-10-CM | POA: Diagnosis present

## 2023-06-26 LAB — CMP (CANCER CENTER ONLY)
ALT: 9 U/L (ref 0–44)
AST: 16 U/L (ref 15–41)
Albumin: 4 g/dL (ref 3.5–5.0)
Alkaline Phosphatase: 126 U/L (ref 38–126)
Anion gap: 10 (ref 5–15)
BUN: 21 mg/dL (ref 8–23)
CO2: 23 mmol/L (ref 22–32)
Calcium: 8.6 mg/dL — ABNORMAL LOW (ref 8.9–10.3)
Chloride: 106 mmol/L (ref 98–111)
Creatinine: 0.7 mg/dL (ref 0.44–1.00)
GFR, Estimated: >60 mL/min (ref >60)
Glucose, Bld: 83 mg/dL (ref 70–99)
Potassium: 4 mmol/L (ref 3.5–5.1)
Sodium: 139 mmol/L (ref 135–145)
Total Bilirubin: 0.4 mg/dL (ref <1.2)
Total Protein: 6.9 g/dL (ref 6.5–8.1)

## 2023-06-26 LAB — CBC WITH DIFFERENTIAL (CANCER CENTER ONLY)
Abs Immature Granulocytes: 0.01 10*3/uL (ref 0.00–0.07)
Basophils Absolute: 0 10*3/uL (ref 0.0–0.1)
Basophils Relative: 1 %
Eosinophils Absolute: 0.2 10*3/uL (ref 0.0–0.5)
Eosinophils Relative: 4 %
HCT: 31.5 % — ABNORMAL LOW (ref 36.0–46.0)
Hemoglobin: 10 g/dL — ABNORMAL LOW (ref 12.0–15.0)
Immature Granulocytes: 0 %
Lymphocytes Relative: 16 %
Lymphs Abs: 0.9 10*3/uL (ref 0.7–4.0)
MCH: 27.3 pg (ref 26.0–34.0)
MCHC: 31.7 g/dL (ref 30.0–36.0)
MCV: 86.1 fL (ref 80.0–100.0)
Monocytes Absolute: 0.7 10*3/uL (ref 0.1–1.0)
Monocytes Relative: 12 %
Neutro Abs: 3.8 10*3/uL (ref 1.7–7.7)
Neutrophils Relative %: 67 %
Platelet Count: 185 10*3/uL (ref 150–400)
RBC: 3.66 MIL/uL — ABNORMAL LOW (ref 3.87–5.11)
RDW: 14.7 % (ref 11.5–15.5)
WBC Count: 5.6 10*3/uL (ref 4.0–10.5)
nRBC: 0 % (ref 0.0–0.2)

## 2023-06-26 LAB — CEA (ACCESS): CEA (CHCC): 186.45 ng/mL — ABNORMAL HIGH (ref 0.00–5.00)

## 2023-06-26 LAB — MAGNESIUM: Magnesium: 1.5 mg/dL — ABNORMAL LOW (ref 1.7–2.4)

## 2023-06-26 MED ORDER — SODIUM CHLORIDE 0.9 % IV SOLN
6.0000 mg/kg | Freq: Once | INTRAVENOUS | Status: AC
  Administered 2023-06-26: 400 mg via INTRAVENOUS
  Filled 2023-06-26: qty 20

## 2023-06-26 MED ORDER — SODIUM CHLORIDE 0.9 % IV SOLN: Freq: Once | INTRAVENOUS | Status: AC

## 2023-06-26 MED ORDER — MAGNESIUM SULFATE 2 GM/50ML IV SOLN
2.0000 g | Freq: Once | INTRAVENOUS | Status: AC
  Administered 2023-06-26: 2 g via INTRAVENOUS
  Filled 2023-06-26: qty 50

## 2023-06-26 MED ORDER — SODIUM CHLORIDE 0.9% FLUSH: 10.0000 mL | INTRAVENOUS | Status: DC | PRN

## 2023-06-26 MED ORDER — HEPARIN SOD (PORK) LOCK FLUSH 100 UNIT/ML IV SOLN: 500.0000 [IU] | Freq: Once | INTRAVENOUS | Status: DC | PRN

## 2023-06-26 NOTE — Progress Notes (Signed)
[facility] OFFICE PROGRESS NOTE   Diagnosis: Colon cancer  INTERVAL HISTORY:   Ms. Adolf returns as scheduled.  She continues Panitumumab/encorafenib.  Overall she is well.  No rash.  Some scattered areas of pruritus.  No nausea or vomiting.  No mouth sores.  No diarrhea.  No paronychia.  She denies bleeding except an occasional nosebleed.  Stable mild exertional dyspnea.  She has an occasional cough.  No fever.  Objective:  Vital signs in last 24 hours:  Blood pressure 128/64, pulse 84, temperature 98.1 F (36.7 C), temperature source Temporal, resp. rate 18, height 5\' 7"  (1.702 m), weight 140 lb 3.2 oz (63.6 kg), SpO2 95%.    HEENT: No thrush or ulcers. Resp: Lungs clear bilaterally. Cardio: Regular rate and rhythm. GI: No hepatosplenomegaly. Vascular: No leg edema. Skin: Ecchymoses over forearms. Port-A-Cath without erythema.  Lab Results:  Lab Results  Component Value Date   WBC 5.6 06/26/2023   HGB 10.0 (L) 06/26/2023   HCT 31.5 (L) 06/26/2023   MCV 86.1 06/26/2023   PLT 185 06/26/2023   NEUTROABS 3.8 06/26/2023    Imaging:  No results found.  Medications: I have reviewed the patient's current medications.  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  Cirrhosis.  Innumerable liver lesions. Colonoscopy 12/18/2019-ulcerated partially obstructing large mass in the mid ascending colon.  Biopsy invasive adenocarcinoma; preserved expression major MMR proteins Upper endoscopy 12/18/2019-normal esophagus, stomach, examined duodenum CEA 12/19/2019-8,923 Biopsy liver lesion 12/23/2019-adenocarcinoma Foundation 1-K-ras wild-type; tumor mutation burden greater than or equal to 10; microsatellite stable; BRAF V600E Cycle 1 FOLFOX 01/01/2020 Cycle 2 FOLFOX 01/15/2020  Cycle 3 FOLFOX 01/29/2020, Udenyca added Cycle 4 FOLFOX 02/12/2020, Udenyca  held Cycle 5 FOLFOX 02/26/2020, Udenyca Cycle 6 FOLFOX 03/11/2020, Udenyca held CT abdomen/pelvis 03/16/2020-stable diffuse liver lesions, stable strictured appearing ascending colon, possible pericolonic implant, vague left lower lobe nodule Cycle 1 FOLFIRI/Avastin 04/06/2020 Cycle 2 FOLFIRI/Avastin 04/21/2020 Cycle 3 FOLFIRI/Avastin 05/06/2020 Cycle 4 FOLFIRI/Avastin 05/20/2020 Cycle 5 FOLFIRI/Avastin 06/03/2020 CTs 06/15/2020-mild to moderate improvement in hepatic metastasis.  No new or progressive disease.  Narrowing within the proximal ascending colon with upstream mild cecal and terminal ileum dilatation, similar. Cycle 6 FOLFIRI/Avastin 06/17/2020 Cycle 7 FOLFIRI/Avastin 07/01/2020 Cycle 8 FOLFIRI/Avastin 07/15/2020 Cycle 9 FOLFIRI/Avastin 07/29/2020-5-FU bolus eliminated, 5-FU infusion and irinotecan dose reduced Cycle 10 FOLFIRI/Avastin 08/12/2020 CTs 08/23/2020- mild residual wall thickening involving the cecum/ascending colon.  Numerous liver metastases improved. Cycle 11 FOLFIRI/Avastin 08/25/2020 Cycle 12 FOLFIRI/Avastin 09/16/2020 Cycle 13 FOLFIRI/Avastin 10/07/2020 Cycle 14 FOLFIRI/Avastin 10/28/2020 Cycle 15 FOLFIRI/Avastin 11/18/2020 CT abdomen/pelvis 12/06/2020-mild improvement in multifocal hepatic metastases, wall thickening at the ascending colon with pericolonic inflammatory changes Cycle 16 FOLFIRI/Avastin 12/09/2020 Cycle 17 FOLFIRI/Avastin 12/30/2020 Cycle 18 FOLFIRI/Avastin 01/20/2021 Cycle 19 FOLFIRI/Avastin 02/10/2021 Cycle 20 FOLFIRI/Avastin 03/03/2021 Cycle 21 FOLFIRI/Avastin 03/24/2021 Cycle 22 FOLFIRI/Avastin 04/14/2021 CTs 05/04/2021-slight improvement in hepatic metastatic disease. Cycle 23 FOLFIRI/Avastin 05/05/2021 Cycle 24 FOLFIRI/Avastin 05/25/2021 Cycle 25 FOLFIRI/Avastin 06/15/2021 Cycle 26 FOLFIRI/Avastin 07/13/2021 Cycle 27 FOLFIRI/Avastin 08/02/2021 CTs 08/30/2021-majority of liver lesions are unchanged in size, 1 lesion adjacent to the gallbladder has increased, no  adenopathy, persistent pericolonic stranding and peritoneal thickening at the cecum Progressive rise in CEA 08/31/2021-treatment changed to Xeloda/bevacizumab, began [place] 09/22/2021 CEA stable 11/03/2021 Xeloda/bevacizumab continued CTs 01/10/2022-increase in size of some of the liver lesions, others are stable, new tiny right lower lobe nodule, increased dilation of distal small bowel with increased wall thickening at the terminal ileum  and ileocecal valve with 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, stable bilateral paracolic gutter peritoneal thickening Panitumumab/ Encorafenib 01/12/2022 (encorafenib 01/19/2022) Panitumumab 02/02/2022 Panitumumab 02/20/2022 Panitumumab 03/06/2022 Panitumumab 03/20/2022 Panitumumab 04/03/2022 CTs 04/14/2022-decrease size of liver metastases, new borderline subcarinal adenopathy, improved peritoneal thickening, cirrhosis or pseudocirrhosis, persistent dilated cecum Encorafenib and Panitumumab continued Panitumumab 04/18/2022 Panitumumab 05/05/2022 Panitumumab 05/19/2022 Panitumumab 06/02/2022 Panitumumab 06/16/2022, 4g IV mag and begin oral mag po once daily  Panitumumab 06/30/2022 Panitumumab 07/14/2022, 2 g IV magnesium, increase oral magnesium to 400 mg twice daily Panitumumab 07/28/2022, 2 g IV magnesium CTs 07/28/2022-no change in hepatic metastases, no new signs of metastatic disease, persistent mural thickening at the terminal ileum/cecum mild splenomegaly unchanged Panitumumab 08/18/2022 Panitumumab 09/01/2022 Panitumumab 09/15/2022 Panitumumab 09/29/2022 Panitumumab 10/13/2022 Panitumumab 10/27/2022 CTs 11/03/2022-unchanged hepatic metastases, no new lesions, 3.7 cm soft tissue density at the medial left kidney etiology unclear, improvement in wall thickening at the ileocecal junction development of appendiceal dilatation Panitumumab 11/10/2022 Panitumumab 11/24/2022 Panitumumab 12/08/2022 Panitumumab 12/22/2022 Panitumumab 01/05/2023 Panitumumab  02/02/2023 Panitumumab 02/16/2023 CTs 03/01/2023-unchanged cecal mass. Enlargement of left liver lesion, other liver lesions unchanged. Near complete resolution of soft tissue left renal pelvis. No pericardial effusion. Panitumumab 03/02/2023 Panitumumab 03/16/2023 Panitumumab 03/30/2023 Panitumumab 04/13/2023 Panitumumab 04/27/2023 Panitumumab 05/11/2023 CTs 05/19/2023-no evidence of metastatic disease in the chest, progressive porta hepatis adenopathy and enlargement of a coalescent left liver lesion, possible mildly enlarging left liver lesion, right colon mass less obvious-slightly improved? Panitumumab 05/25/2023, Encorafenib continued Panitumumab 06/08/2023 Panitumumab 06/26/2023 Anemia secondary to GI blood loss, and chemotherapy, on oral iron BID  Red cell transfusion 02/02/2023 Cirrhosis 12/26/2019-hospital admission for right lower extremity DVT and GI bleed, treated with heparin anticoagulation beginning 12/26/2019, converted to Lovenox at discharge 12/29/2019 Lovenox reduced to 40 mg daily 02/10/2021 secondary to bruising and nosebleeding History of low-grade fever-likely "tumor" fever COVID-19 infection January 2021 Admission 01/19/2023 with dyspnea/orthopnea, pericardial effusion, EKG changes suggestive of pericarditis Echocardiogram 01/20/2023-LVEF 60-65%, small to moderate circumferential pericardial effusion without tamponade physiology Echocardiogram 02/19/2022-LVEF 70-75%; trivial pericardial effusion. Anemia secondary to chemotherapy, chronic disease, and phlebotomy 2 units of packed red blood cells 02/03/2023      Disposition: Ms. Levi appears stable.  There is no clinical evidence of disease progression.  Plan to continue Panitumumab and encorafenib.  Restaging CTs in the next month or so.  CBC and chemistry panel reviewed.  Labs adequate to proceed as above.  Persistent mild low magnesium.  She will receive 2 g IV magnesium today.  She will return for follow-up and treatment in 2  weeks.  We are available to see her sooner if needed.  Lonna Cobb ANP/GNP-BC   06/26/2023  1:22 PM

## 2023-06-26 NOTE — Patient Instructions (Signed)
[facility] - A DEPT OF [facility]   Discharge Instructions: Thank you for choosing [facility] to provide your oncology and hematology care.   If you have a lab appointment with the Cancer Center, please go directly to the Cancer Center and check in at the registration area.   Wear comfortable clothing and clothing appropriate for easy access to any Portacath or PICC line.   We strive to give you quality time with your provider. You may need to reschedule your appointment if you arrive late (15 or more minutes).  Arriving late affects you and other patients whose appointments are after yours.  Also, if you miss three or more appointments without notifying the office, you may be dismissed from the clinic at the provider's discretion.      For prescription refill requests, have your pharmacy contact our office and allow 72 hours for refills to be completed.    Today you received the following chemotherapy and/or immunotherapy agents Vectibix/Magnesium       To help prevent nausea and vomiting after your treatment, we encourage you to take your nausea medication as directed.  BELOW ARE SYMPTOMS THAT SHOULD BE REPORTED IMMEDIATELY: *FEVER GREATER THAN 100.4 F (38 C) OR HIGHER *CHILLS OR SWEATING *NAUSEA AND VOMITING THAT IS NOT CONTROLLED WITH YOUR NAUSEA MEDICATION *UNUSUAL SHORTNESS OF BREATH *UNUSUAL BRUISING OR BLEEDING *URINARY PROBLEMS (pain or burning when urinating, or frequent urination) *BOWEL PROBLEMS (unusual diarrhea, constipation, pain near the anus) TENDERNESS IN MOUTH AND THROAT WITH OR WITHOUT PRESENCE OF ULCERS (sore throat, sores in mouth, or a toothache) UNUSUAL RASH, SWELLING OR PAIN  UNUSUAL VAGINAL DISCHARGE OR ITCHING   Items with * indicate a potential emergency and should be followed up as soon as possible or go to the Emergency Department if any problems should occur.  Please show the CHEMOTHERAPY ALERT CARD or  IMMUNOTHERAPY ALERT CARD at check-in to the Emergency Department and triage nurse.  Should you have questions after your visit or need to cancel or reschedule your appointment, please contact [facility] - A DEPT OF [facility]  Dept: [phone]  and follow the prompts.  Office hours are 8:00 a.m. to 4:30 p.m. Monday - Friday. Please note that voicemails left after 4:00 p.m. may not be returned until the following business day.  We are closed weekends and major holidays. You have access to a nurse at all times for urgent questions. Please call the main number to the clinic Dept: [phone] and follow the prompts.   For any non-urgent questions, you may also contact your provider using MyChart. We now offer e-Visits for anyone 34 and older to request care online for non-urgent symptoms. For details visit [email or web address].   Also download the MyChart app! Go to the app store, search "MyChart", open the app, select , and log in with your MyChart username and password.  Panitumumab Injection What is this medication? PANITUMUMAB (pan i TOOM ue mab) treats colorectal cancer. It works by blocking a protein that causes cancer cells to grow and multiply. This helps to slow or stop the spread of cancer cells. It is a monoclonal antibody. This medicine may be used for other purposes; ask your health care provider or pharmacist if you have questions. COMMON BRAND NAME(S): Vectibix What should I tell my care team before I take this medication? They need to know if you have any of these conditions: Eye disease Low  levels of magnesium in the blood Lung disease An unusual or allergic reaction to panitumumab, other medications, foods, dyes, or preservatives Pregnant or trying to get pregnant Breast-feeding How should I use this medication? This medication is injected into a vein. It is given by your care team in a hospital or clinic setting. Talk to your  care team about the use of this medication in children. Special care may be needed. Overdosage: If you think you have taken too much of this medicine contact a poison control center or emergency room at once. NOTE: This medicine is only for you. Do not share this medicine with others. What if I miss a dose? Keep appointments for follow-up doses. It is important not to miss your dose. Call your care team if you are unable to keep an appointment. What may interact with this medication? Bevacizumab This list may not describe all possible interactions. Give your health care provider a list of all the medicines, herbs, non-prescription drugs, or dietary supplements you use. Also tell them if you smoke, drink alcohol, or use illegal drugs. Some items may interact with your medicine. What should I watch for while using this medication? Your condition will be monitored carefully while you are receiving this medication. This medication may make you feel generally unwell. This is not uncommon as chemotherapy can affect healthy cells as well as cancer cells. Report any side effects. Continue your course of treatment even though you feel ill unless your care team tells you to stop. This medication can make you more sensitive to the sun. Keep out of the sun while receiving this medication and for 2 months after stopping therapy. If you cannot avoid being in the sun, wear protective clothing and sunscreen. Do not use sun lamps, tanning beds, or tanning booths. Check with your care team if you have severe diarrhea, nausea, and vomiting or if you sweat a lot. The loss of too much body fluid may make it dangerous for you to take this medication. This medication may cause serious skin reactions. They can happen weeks to months after starting the medication. Contact your care team right away if you notice fevers or flu-like symptoms with a rash. The rash may be red or purple and then turn into blisters or peeling of the  skin. You may also notice a red rash with swelling of the face, lips, or lymph nodes in your neck or under your arms. Talk to your care team if you may be pregnant. Serious birth defects can occur if you take this medication during pregnancy and for 2 months after the last dose. Contraception is recommended while taking this medication and for 2 months after the last dose. Your care team can help you find the option that works for you. Do not breastfeed while taking this medication and for 2 months after the last dose. This medication may cause infertility. Talk to your care team if you are concerned about your fertility. What side effects may I notice from receiving this medication? Side effects that you should report to your care team as soon as possible: Allergic reactions--skin rash, itching, hives, swelling of the face, lips, tongue, or throat Dry cough, shortness of breath or trouble breathing Eye pain, redness, irritation, or discharge with blurry or decreased vision Infusion reactions--chest pain, shortness of breath or trouble breathing, feeling faint or lightheaded Low magnesium level--muscle pain or cramps, unusual weakness or fatigue, fast or irregular heartbeat, tremors Low potassium level--muscle pain or cramps, unusual weakness  or fatigue, fast or irregular heartbeat, constipation Redness, blistering, peeling, or loosening of the skin, including inside the mouth Skin reactions on sun-exposed areas Side effects that usually do not require medical attention (report to your care team if they continue or are bothersome): Change in nail shape, thickness, or color Diarrhea Dry skin Fatigue Nausea Vomiting This list may not describe all possible side effects. Call your doctor for medical advice about side effects. You may report side effects to FDA at 1-800-FDA-1088. Where should I keep my medication? This medication is given in a hospital or clinic. It will not be stored at  home. NOTE: This sheet is a summary. It may not cover all possible information. If you have questions about this medicine, talk to your doctor, pharmacist, or health care provider.  2024 Elsevier/Gold Standard (2021-12-14 00:00:00) Hypomagnesemia Hypomagnesemia is a condition in which the level of magnesium in the blood is too low. Magnesium is a mineral that is found in many foods. It is used in many different processes in the body. Hypomagnesemia can affect every organ in the body. In severe cases, it can cause life-threatening problems. What are the causes? This condition may be caused by: Not getting enough magnesium in your diet or not having enough healthy foods to eat (malnutrition). Problems with magnesium absorption in the intestines. Dehydration. Excessive use of alcohol. Vomiting. Severe or long-term (chronic) diarrhea. Some medicines, including medicines that make you urinate more often (diuretics). Certain diseases, such as kidney disease, diabetes, celiac disease, and overactive thyroid. What are the signs or symptoms? Symptoms of this condition include: Loss of appetite, nausea, and vomiting. Involuntary shaking or trembling of a body part (tremor). Muscle weakness or tingling in the arms and legs. Sudden tightening of muscles (muscle spasms). Confusion. Psychiatric issues, such as: Depression and irritability. Psychosis. A feeling of fluttering of the heart (palpitations). Seizures. These symptoms are more severe if magnesium levels drop suddenly. How is this diagnosed? This condition may be diagnosed based on: Your symptoms and medical history. A physical exam. Blood and urine tests. How is this treated? Treatment depends on the cause and the severity of the condition. It may be treated by: Taking a magnesium supplement. This can be taken in pill form. If the condition is severe, magnesium is usually given through an IV. Making changes to your diet. You may be  directed to eat foods that have a lot of magnesium, such as green leafy vegetables, peas, beans, and nuts. Not drinking alcohol. If you are struggling not to drink, ask your health care provider for help. Follow these instructions at home: Eating and drinking     Make sure that your diet includes foods with magnesium. Foods that have a lot of magnesium in them include: Green leafy vegetables, such as spinach and broccoli. Beans and peas. Nuts and seeds, such as almonds and sunflower seeds. Whole grains, such as whole grain bread and fortified cereals. Drink fluids that contain salts and minerals (electrolytes), such as sports drinks, when you are active. Do not drink alcohol. General instructions Take over-the-counter and prescription medicines only as told by your health care provider. Take magnesium supplements as directed if your health care provider tells you to take them. Have your magnesium levels monitored as told by your health care provider. Keep all follow-up visits. This is important. Contact a health care provider if: You get worse instead of better. Your symptoms return. Get help right away if: You develop severe muscle weakness. You have trouble  breathing. You feel that your heart is racing. These symptoms may represent a serious problem that is an emergency. Do not wait to see if the symptoms will go away. Get medical help right away. Call your local emergency services (911 in the [place] Do not drive yourself to the hospital. Summary Hypomagnesemia is a condition in which the level of magnesium in the blood is too low. Hypomagnesemia can affect every organ in the body. Treatment may include eating more foods that contain magnesium, taking magnesium supplements, and not drinking alcohol. Have your magnesium levels monitored as told by your health care provider. This information is not intended to replace advice given to you by your health care provider. Make sure you  discuss any questions you have with your health care provider. Document Revised: 12/28/2020 Document Reviewed: 12/28/2020 Elsevier Patient Education  2024 [employer].

## 2023-06-27 ENCOUNTER — Inpatient Hospital Stay: Payer: BC Managed Care – PPO

## 2023-07-05 ENCOUNTER — Other Ambulatory Visit: Payer: Self-pay | Admitting: *Deleted

## 2023-07-05 DIAGNOSIS — C182 Malignant neoplasm of ascending colon: Secondary | ICD-10-CM

## 2023-07-06 ENCOUNTER — Inpatient Hospital Stay: Payer: BC Managed Care – PPO

## 2023-07-06 ENCOUNTER — Inpatient Hospital Stay: Payer: BC Managed Care – PPO | Admitting: Oncology

## 2023-07-06 VITALS — BP 134/49 | HR 99 | Temp 97.7°F | Resp 18 | Wt 140.9 lb

## 2023-07-06 VITALS — BP 149/63 | HR 70

## 2023-07-06 DIAGNOSIS — C182 Malignant neoplasm of ascending colon: Secondary | ICD-10-CM

## 2023-07-06 DIAGNOSIS — Z23 Encounter for immunization: Secondary | ICD-10-CM

## 2023-07-06 DIAGNOSIS — E876 Hypokalemia: Secondary | ICD-10-CM

## 2023-07-06 DIAGNOSIS — Z5112 Encounter for antineoplastic immunotherapy: Secondary | ICD-10-CM | POA: Diagnosis not present

## 2023-07-06 LAB — CMP (CANCER CENTER ONLY)
ALT: 8 U/L (ref 0–44)
AST: 14 U/L — ABNORMAL LOW (ref 15–41)
Albumin: 3.7 g/dL (ref 3.5–5.0)
Alkaline Phosphatase: 95 U/L (ref 38–126)
Anion gap: 8 (ref 5–15)
BUN: 17 mg/dL (ref 8–23)
CO2: 24 mmol/L (ref 22–32)
Calcium: 8.4 mg/dL — ABNORMAL LOW (ref 8.9–10.3)
Chloride: 108 mmol/L (ref 98–111)
Creatinine: 0.66 mg/dL (ref 0.44–1.00)
GFR, Estimated: >60 mL/min (ref >60)
Glucose, Bld: 106 mg/dL — ABNORMAL HIGH (ref 70–99)
Potassium: 3.2 mmol/L — ABNORMAL LOW (ref 3.5–5.1)
Sodium: 140 mmol/L (ref 135–145)
Total Bilirubin: 0.4 mg/dL (ref <1.2)
Total Protein: 6.5 g/dL (ref 6.5–8.1)

## 2023-07-06 LAB — CBC WITH DIFFERENTIAL (CANCER CENTER ONLY)
Abs Immature Granulocytes: 0.02 10*3/uL (ref 0.00–0.07)
Basophils Absolute: 0 10*3/uL (ref 0.0–0.1)
Basophils Relative: 1 %
Eosinophils Absolute: 0.2 10*3/uL (ref 0.0–0.5)
Eosinophils Relative: 3 %
HCT: 29.4 % — ABNORMAL LOW (ref 36.0–46.0)
Hemoglobin: 9.4 g/dL — ABNORMAL LOW (ref 12.0–15.0)
Immature Granulocytes: 0 %
Lymphocytes Relative: 11 %
Lymphs Abs: 0.6 10*3/uL — ABNORMAL LOW (ref 0.7–4.0)
MCH: 27.4 pg (ref 26.0–34.0)
MCHC: 32 g/dL (ref 30.0–36.0)
MCV: 85.7 fL (ref 80.0–100.0)
Monocytes Absolute: 0.6 10*3/uL (ref 0.1–1.0)
Monocytes Relative: 10 %
Neutro Abs: 4.4 10*3/uL (ref 1.7–7.7)
Neutrophils Relative %: 75 %
Platelet Count: 197 10*3/uL (ref 150–400)
RBC: 3.43 MIL/uL — ABNORMAL LOW (ref 3.87–5.11)
RDW: 14.6 % (ref 11.5–15.5)
WBC Count: 5.9 10*3/uL (ref 4.0–10.5)
nRBC: 0 % (ref 0.0–0.2)

## 2023-07-06 LAB — MAGNESIUM: Magnesium: 1 mg/dL — ABNORMAL LOW (ref 1.7–2.4)

## 2023-07-06 MED ORDER — SODIUM CHLORIDE 0.9 % IV SOLN: Freq: Once | INTRAVENOUS | Status: AC

## 2023-07-06 MED ORDER — SODIUM CHLORIDE 0.9 % IV SOLN
6.0000 mg/kg | Freq: Once | INTRAVENOUS | Status: AC
  Administered 2023-07-06: 400 mg via INTRAVENOUS
  Filled 2023-07-06: qty 20

## 2023-07-06 MED ORDER — OXYCODONE-ACETAMINOPHEN 5-325 MG PO TABS: 1.0000 | ORAL_TABLET | Freq: Three times a day (TID) | ORAL | 0 refills | Status: DC | PRN

## 2023-07-06 MED ORDER — POTASSIUM CHLORIDE 10 MEQ/100ML IV SOLN
10.0000 meq | INTRAVENOUS | Status: AC
Start: 2023-07-06 — End: 2023-07-06
  Administered 2023-07-06 (×2): 10 meq via INTRAVENOUS
  Filled 2023-07-06: qty 100

## 2023-07-06 MED ORDER — MAGNESIUM SULFATE 4 GM/100ML IV SOLN
4.0000 g | Freq: Once | INTRAVENOUS | Status: AC
  Administered 2023-07-06: 4 g via INTRAVENOUS
  Filled 2023-07-06: qty 100

## 2023-07-06 MED ORDER — SODIUM CHLORIDE 0.9% FLUSH
10.0000 mL | INTRAVENOUS | Status: DC | PRN
  Administered 2023-07-06: 10 mL

## 2023-07-06 MED ORDER — HEPARIN SOD (PORK) LOCK FLUSH 100 UNIT/ML IV SOLN
500.0000 [IU] | Freq: Once | INTRAVENOUS | Status: AC | PRN
  Administered 2023-07-06: 500 [IU]

## 2023-07-06 MED ORDER — INFLUENZA VAC A&B SURF ANT ADJ 0.5 ML IM SUSY
0.5000 mL | PREFILLED_SYRINGE | Freq: Once | INTRAMUSCULAR | Status: AC
  Administered 2023-07-06: 0.5 mL via INTRAMUSCULAR
  Filled 2023-07-06: qty 0.5

## 2023-07-06 NOTE — Progress Notes (Signed)
Patient seen by Dr. Thornton Papas today  Vitals are within treatment parameters:Yes   Labs are within treatment parameters: No (Please specify and give further instructions.) K+ 3.2 and Mg+1.0 --OK to proceed  Treatment plan has been signed: Yes   Per physician team, Patient is ready for treatment. Please note the following modifications: Please give 20 meq IV KCl and 4 grams IV Mg+ today. Also requests flu vaccine

## 2023-07-06 NOTE — Progress Notes (Signed)
[facility] OFFICE PROGRESS NOTE   Diagnosis: Colon cancer  INTERVAL HISTORY:   Ms. Chiriboga completed another treatment with panitumumab on 06/26/2023.  She continues encorafenib.  No mouth sores.  No change in the mild skin rash.  No paronychia.  She continues working.  Objective:  Vital signs in last 24 hours:  Blood pressure (!) 134/49, pulse 99, temperature 97.7 F (36.5 C), temperature source Temporal, resp. rate 18, weight 140 lb 14.4 oz (63.9 kg), SpO2 100%.    HEENT: Erythema at the tip of the tongue, no thrush or ulcers Resp: Lungs clear bilaterally Cardio: Regular rate and rhythm GI: Mild tenderness in the right subcostal area, no hepatosplenomegaly, no mass Vascular: No leg edema  Skin: Palms without erythema, dryness of the soles.  Mild acne type rash over the face  Portacath/PICC-without erythema  Lab Results:  Lab Results  Component Value Date   WBC 5.9 07/06/2023   HGB 9.4 (L) 07/06/2023   HCT 29.4 (L) 07/06/2023   MCV 85.7 07/06/2023   PLT 197 07/06/2023   NEUTROABS 4.4 07/06/2023    CMP  Lab Results  Component Value Date   NA 140 07/06/2023   K 3.2 (L) 07/06/2023   CL 108 07/06/2023   CO2 24 07/06/2023   GLUCOSE 106 (H) 07/06/2023   BUN 17 07/06/2023   CREATININE 0.66 07/06/2023   CALCIUM 8.4 (L) 07/06/2023   PROT 6.5 07/06/2023   ALBUMIN 3.7 07/06/2023   AST 14 (L) 07/06/2023   ALT 8 07/06/2023   ALKPHOS 95 07/06/2023   BILITOT 0.4 07/06/2023   GFRNONAA >60 07/06/2023   GFRAA >60 05/06/2020    Lab Results  Component Value Date   CEA1 54.57 (H) 12/30/2020   CEA 186.45 (H) 06/26/2023     Medications: I have reviewed the patient's current medications.   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  Cirrhosis.  Innumerable liver lesions. Colonoscopy 12/18/2019-ulcerated partially obstructing large mass  in the mid ascending colon.  Biopsy invasive adenocarcinoma; preserved expression major MMR proteins Upper endoscopy 12/18/2019-normal esophagus, stomach, examined duodenum CEA 12/19/2019-8,923 Biopsy liver lesion 12/23/2019-adenocarcinoma Foundation 1-K-ras wild-type; tumor mutation burden greater than or equal to 10; microsatellite stable; BRAF V600E Cycle 1 FOLFOX 01/01/2020 Cycle 2 FOLFOX 01/15/2020  Cycle 3 FOLFOX 01/29/2020, Udenyca added Cycle 4 FOLFOX 02/12/2020, Udenyca held Cycle 5 FOLFOX 02/26/2020, Udenyca Cycle 6 FOLFOX 03/11/2020, Udenyca held CT abdomen/pelvis 03/16/2020-stable diffuse liver lesions, stable strictured appearing ascending colon, possible pericolonic implant, vague left lower lobe nodule Cycle 1 FOLFIRI/Avastin 04/06/2020 Cycle 2 FOLFIRI/Avastin 04/21/2020 Cycle 3 FOLFIRI/Avastin 05/06/2020 Cycle 4 FOLFIRI/Avastin 05/20/2020 Cycle 5 FOLFIRI/Avastin 06/03/2020 CTs 06/15/2020-mild to moderate improvement in hepatic metastasis.  No new or progressive disease.  Narrowing within the proximal ascending colon with upstream mild cecal and terminal ileum dilatation, similar. Cycle 6 FOLFIRI/Avastin 06/17/2020 Cycle 7 FOLFIRI/Avastin 07/01/2020 Cycle 8 FOLFIRI/Avastin 07/15/2020 Cycle 9 FOLFIRI/Avastin 07/29/2020-5-FU bolus eliminated, 5-FU infusion and irinotecan dose reduced Cycle 10 FOLFIRI/Avastin 08/12/2020 CTs 08/23/2020- mild residual wall thickening involving the cecum/ascending colon.  Numerous liver metastases improved. Cycle 11 FOLFIRI/Avastin 08/25/2020 Cycle 12 FOLFIRI/Avastin 09/16/2020 Cycle 13 FOLFIRI/Avastin 10/07/2020 Cycle 14 FOLFIRI/Avastin 10/28/2020 Cycle 15 FOLFIRI/Avastin 11/18/2020 CT abdomen/pelvis 12/06/2020-mild improvement in multifocal hepatic metastases, wall thickening at the ascending colon with pericolonic inflammatory changes Cycle 16 FOLFIRI/Avastin 12/09/2020 Cycle 17 FOLFIRI/Avastin 12/30/2020 Cycle 18 FOLFIRI/Avastin 01/20/2021 Cycle 19 FOLFIRI/Avastin  02/10/2021 Cycle 20 FOLFIRI/Avastin 03/03/2021 Cycle 21 FOLFIRI/Avastin  03/24/2021 Cycle 22 FOLFIRI/Avastin 04/14/2021 CTs 05/04/2021-slight improvement in hepatic metastatic disease. Cycle 23 FOLFIRI/Avastin 05/05/2021 Cycle 24 FOLFIRI/Avastin 05/25/2021 Cycle 25 FOLFIRI/Avastin 06/15/2021 Cycle 26 FOLFIRI/Avastin 07/13/2021 Cycle 27 FOLFIRI/Avastin 08/02/2021 CTs 08/30/2021-majority of liver lesions are unchanged in size, 1 lesion adjacent to the gallbladder has increased, no adenopathy, persistent pericolonic stranding and peritoneal thickening at the cecum Progressive rise in CEA 08/31/2021-treatment changed to Xeloda/bevacizumab, began [place] 09/22/2021 CEA stable 11/03/2021 Xeloda/bevacizumab continued CTs 01/10/2022-increase in size of some of the liver lesions, others are stable, new tiny right lower lobe nodule, increased dilation of distal small bowel with increased wall thickening at the terminal ileum and ileocecal valve with 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, stable bilateral paracolic gutter peritoneal thickening Panitumumab/ Encorafenib 01/12/2022 (encorafenib 01/19/2022) Panitumumab 02/02/2022 Panitumumab 02/20/2022 Panitumumab 03/06/2022 Panitumumab 03/20/2022 Panitumumab 04/03/2022 CTs 04/14/2022-decrease size of liver metastases, new borderline subcarinal adenopathy, improved peritoneal thickening, cirrhosis or pseudocirrhosis, persistent dilated cecum Encorafenib and Panitumumab continued Panitumumab 04/18/2022 Panitumumab 05/05/2022 Panitumumab 05/19/2022 Panitumumab 06/02/2022 Panitumumab 06/16/2022, 4g IV mag and begin oral mag po once daily  Panitumumab 06/30/2022 Panitumumab 07/14/2022, 2 g IV magnesium, increase oral magnesium to 400 mg twice daily Panitumumab 07/28/2022, 2 g IV magnesium CTs 07/28/2022-no change in hepatic metastases, no new signs of metastatic disease, persistent mural thickening at the terminal ileum/cecum mild splenomegaly unchanged Panitumumab  08/18/2022 Panitumumab 09/01/2022 Panitumumab 09/15/2022 Panitumumab 09/29/2022 Panitumumab 10/13/2022 Panitumumab 10/27/2022 CTs 11/03/2022-unchanged hepatic metastases, no new lesions, 3.7 cm soft tissue density at the medial left kidney etiology unclear, improvement in wall thickening at the ileocecal junction development of appendiceal dilatation Panitumumab 11/10/2022 Panitumumab 11/24/2022 Panitumumab 12/08/2022 Panitumumab 12/22/2022 Panitumumab 01/05/2023 Panitumumab 02/02/2023 Panitumumab 02/16/2023 CTs 03/01/2023-unchanged cecal mass. Enlargement of left liver lesion, other liver lesions unchanged. Near complete resolution of soft tissue left renal pelvis. No pericardial effusion. Panitumumab 03/02/2023 Panitumumab 03/16/2023 Panitumumab 03/30/2023 Panitumumab 04/13/2023 Panitumumab 04/27/2023 Panitumumab 05/11/2023 CTs 05/19/2023-no evidence of metastatic disease in the chest, progressive porta hepatis adenopathy and enlargement of a coalescent left liver lesion, possible mildly enlarging left liver lesion, right colon mass less obvious-slightly improved? Panitumumab 05/25/2023, Encorafenib continued Panitumumab 06/08/2023 Panitumumab 06/26/2023 Panitumumab 07/06/2023 Anemia secondary to GI blood loss, and chemotherapy, on oral iron BID  Red cell transfusion 02/02/2023 Cirrhosis 12/26/2019-hospital admission for right lower extremity DVT and GI bleed, treated with heparin anticoagulation beginning 12/26/2019, converted to Lovenox at discharge 12/29/2019 Lovenox reduced to 40 mg daily 02/10/2021 secondary to bruising and nosebleeding History of low-grade fever-likely "tumor" fever COVID-19 infection January 2021 Admission 01/19/2023 with dyspnea/orthopnea, pericardial effusion, EKG changes suggestive of pericarditis Echocardiogram 01/20/2023-LVEF 60-65%, small to moderate circumferential pericardial effusion without tamponade physiology Echocardiogram 02/19/2022-LVEF 70-75%; trivial pericardial  effusion. Anemia secondary to chemotherapy, chronic disease, and phlebotomy 2 units of packed red blood cells 02/03/2023        Disposition: Stacy Burns has metastatic colon cancer.  She appears stable.  There is no clinical evidence of disease progression.  We will follow-up on the CEA from today.  She will continue encorafenib/panitumumab.  She will receive potassium and magnesium supplementation today.  She will resume an oral potassium supplement at home.  Ms. Takeda will return for an office visit and panitumumab in 2 weeks.  She will be scheduled for a restaging CT in December.  Thornton Papas, MD  07/06/2023  9:34 AM

## 2023-07-06 NOTE — Patient Instructions (Signed)
[facility] - A DEPT OF [facility]  Discharge Instructions: Thank you for choosing [facility] to provide your oncology and hematology care.   If you have a lab appointment with the [facility], please go directly to the [facility] and check in at the registration area.   Wear comfortable clothing and clothing appropriate for easy access to any Portacath or PICC line.   We strive to give you quality time with your provider. You may need to reschedule your appointment if you arrive late (15 or more minutes).  Arriving late affects you and other patients whose appointments are after yours.  Also, if you miss three or more appointments without notifying the office, you may be dismissed from the clinic at the provider's discretion.      For prescription refill requests, have your pharmacy contact our office and allow 72 hours for refills to be completed.    Today you received the following chemotherapy and/or immunotherapy agents Vectibix       To help prevent nausea and vomiting after your treatment, we encourage you to take your nausea medication as directed.  BELOW ARE SYMPTOMS THAT SHOULD BE REPORTED IMMEDIATELY: *FEVER GREATER THAN 100.4 F (38 C) OR HIGHER *CHILLS OR SWEATING *NAUSEA AND VOMITING THAT IS NOT CONTROLLED WITH YOUR NAUSEA MEDICATION *UNUSUAL SHORTNESS OF BREATH *UNUSUAL BRUISING OR BLEEDING *URINARY PROBLEMS (pain or burning when urinating, or frequent urination) *BOWEL PROBLEMS (unusual diarrhea, constipation, pain near the anus) TENDERNESS IN MOUTH AND THROAT WITH OR WITHOUT PRESENCE OF ULCERS (sore throat, sores in mouth, or a toothache) UNUSUAL RASH, SWELLING OR PAIN  UNUSUAL VAGINAL DISCHARGE OR ITCHING   Items with * indicate a potential emergency and should be followed up as soon as possible or go to the Emergency Department if any problems should occur.  Please show the CHEMOTHERAPY ALERT CARD or IMMUNOTHERAPY  ALERT CARD at check-in to the Emergency Department and triage nurse.  Should you have questions after your visit or need to cancel or reschedule your appointment, please contact [facility] - A DEPT OF [facility]  Dept: [phone]  and follow the prompts.  Office hours are 8:00 a.m. to 4:30 p.m. Monday - Friday. Please note that voicemails left after 4:00 p.m. may not be returned until the following business day.  We are closed weekends and major holidays. You have access to a nurse at all times for urgent questions. Please call the main number to the clinic Dept: [phone] and follow the prompts.   For any non-urgent questions, you may also contact your provider using MyChart. We now offer e-Visits for anyone 62 and older to request care online for non-urgent symptoms. For details visit [email or web address].   Also download the MyChart app! Go to the app store, search "MyChart", open the app, select [facility], and log in with your MyChart username and password.  Panitumumab Injection What is this medication? PANITUMUMAB (pan i TOOM ue mab) treats colorectal cancer. It works by blocking a protein that causes cancer cells to grow and multiply. This helps to slow or stop the spread of cancer cells. It is a monoclonal antibody. This medicine may be used for other purposes; ask your health care provider or pharmacist if you have questions. COMMON BRAND NAME(S): Vectibix What should I tell my care team before I take this medication? They need to know if you have any of these conditions: Eye disease Low levels  of magnesium in the blood Lung disease An unusual or allergic reaction to panitumumab, other medications, foods, dyes, or preservatives Pregnant or trying to get pregnant Breast-feeding How should I use this medication? This medication is injected into a vein. It is given by your care team in a hospital or clinic setting. Talk to your care team  about the use of this medication in children. Special care may be needed. Overdosage: If you think you have taken too much of this medicine contact a poison control [facility] or emergency room at once. NOTE: This medicine is only for you. Do not share this medicine with others. What if I miss a dose? Keep appointments for follow-up doses. It is important not to miss your dose. Call your care team if you are unable to keep an appointment. What may interact with this medication? Bevacizumab This list may not describe all possible interactions. Give your health care provider a list of all the medicines, herbs, non-prescription drugs, or dietary supplements you use. Also tell them if you smoke, drink alcohol, or use illegal drugs. Some items may interact with your medicine. What should I watch for while using this medication? Your condition will be monitored carefully while you are receiving this medication. This medication may make you feel generally unwell. This is not uncommon as chemotherapy can affect healthy cells as well as cancer cells. Report any side effects. Continue your course of treatment even though you feel ill unless your care team tells you to stop. This medication can make you more sensitive to the sun. Keep out of the sun while receiving this medication and for 2 months after stopping therapy. If you cannot avoid being in the sun, wear protective clothing and sunscreen. Do not use sun lamps, tanning beds, or tanning booths. Check with your care team if you have severe diarrhea, nausea, and vomiting or if you sweat a lot. The loss of too much body fluid may make it dangerous for you to take this medication. This medication may cause serious skin reactions. They can happen weeks to months after starting the medication. Contact your care team right away if you notice fevers or flu-like symptoms with a rash. The rash may be red or purple and then turn into blisters or peeling of the skin. You  may also notice a red rash with swelling of the face, lips, or lymph nodes in your neck or under your arms. Talk to your care team if you may be pregnant. Serious birth defects can occur if you take this medication during pregnancy and for 2 months after the last dose. Contraception is recommended while taking this medication and for 2 months after the last dose. Your care team can help you find the option that works for you. Do not breastfeed while taking this medication and for 2 months after the last dose. This medication may cause infertility. Talk to your care team if you are concerned about your fertility. What side effects may I notice from receiving this medication? Side effects that you should report to your care team as soon as possible: Allergic reactions--skin rash, itching, hives, swelling of the face, lips, tongue, or throat Dry cough, shortness of breath or trouble breathing Eye pain, redness, irritation, or discharge with blurry or decreased vision Infusion reactions--chest pain, shortness of breath or trouble breathing, feeling faint or lightheaded Low magnesium level--muscle pain or cramps, unusual weakness or fatigue, fast or irregular heartbeat, tremors Low potassium level--muscle pain or cramps, unusual weakness or  fatigue, fast or irregular heartbeat, constipation Redness, blistering, peeling, or loosening of the skin, including inside the mouth Skin reactions on sun-exposed areas Side effects that usually do not require medical attention (report to your care team if they continue or are bothersome): Change in nail shape, thickness, or color Diarrhea Dry skin Fatigue Nausea Vomiting This list may not describe all possible side effects. Call your doctor for medical advice about side effects. You may report side effects to FDA at 1-800-FDA-1088. Where should I keep my medication? This medication is given in a hospital or clinic. It will not be stored at home. NOTE: This  sheet is a summary. It may not cover all possible information. If you have questions about this medicine, talk to your doctor, pharmacist, or health care provider.  2024 Elsevier/Gold Standard (2021-12-14 00:00:00)  Hypokalemia Hypokalemia means that the amount of potassium in the blood is lower than normal. Potassium is a mineral (electrolyte) that helps regulate the amount of fluid in the body. It also stimulates muscle tightening (contraction) and helps nerves work properly. Normally, most of the body's potassium is inside cells, and only a very small amount is in the blood. Because the amount in the blood is so small, minor changes to potassium levels in the blood can be life-threatening. What are the causes? This condition may be caused by: Antibiotic medicine. Diarrhea or vomiting. Taking too much of a medicine that helps you have a bowel movement (laxative) can cause diarrhea and lead to hypokalemia. Chronic kidney disease (CKD). Medicines that help the body get rid of excess fluid (diuretics). Eating disorders, such as anorexia or bulimia. Low magnesium levels in the body. Sweating a lot. What are the signs or symptoms? Symptoms of this condition include: Weakness. Constipation. Fatigue. Muscle cramps. Mental confusion. Skipped heartbeats or irregular heartbeat (palpitations). Tingling or numbness. How is this diagnosed? This condition is diagnosed with a blood test. How is this treated? This condition may be treated by: Taking potassium supplements. Adjusting the medicines that you take. Eating more foods that contain a lot of potassium. If your potassium level is very low, you may need to get potassium through an IV and be monitored in the hospital. Follow these instructions at home: Eating and drinking  Eat a healthy diet. A healthy diet includes fresh fruits and vegetables, whole grains, healthy fats, and lean proteins. If told, eat more foods that contain a lot of  potassium. These include: Nuts, such as peanuts and pistachios. Seeds, such as sunflower seeds and pumpkin seeds. Peas, lentils, and lima beans. Whole grain and bran cereals and breads. Fresh fruits and vegetables, such as apricots, avocado, bananas, cantaloupe, kiwi, oranges, tomatoes, asparagus, and potatoes. Juices, such as orange, tomato, and prune. Lean meats, including fish. Milk and milk products, such as yogurt. General instructions Take over-the-counter and prescription medicines only as told by your health care provider. This includes vitamins, natural food products, and supplements. Keep all follow-up visits. This is important. Contact a health care provider if: You have weakness that gets worse. You feel your heart pounding or racing. You vomit. You have diarrhea. You have diabetes and you have trouble keeping your blood sugar in your target range. Get help right away if: You have chest pain. You have shortness of breath. You have vomiting or diarrhea that lasts for more than 2 days. You faint. These symptoms may be an emergency. Get help right away. Call 911. Do not wait to see if the symptoms will go away.  Do not drive yourself to the hospital. Summary Hypokalemia means that the amount of potassium in the blood is lower than normal. This condition is diagnosed with a blood test. Hypokalemia may be treated by taking potassium supplements, adjusting the medicines that you take, or eating more foods that are high in potassium. If your potassium level is very low, you may need to get potassium through an IV and be monitored in the hospital. This information is not intended to replace advice given to you by your health care provider. Make sure you discuss any questions you have with your health care provider. Document Revised: 04/14/2021 Document Reviewed: 04/14/2021 Elsevier Patient Education  2024 Elsevier Inc.  Hypomagnesemia Hypomagnesemia is a condition in which the  level of magnesium in the blood is too low. Magnesium is a mineral that is found in many foods. It is used in many different processes in the body. Hypomagnesemia can affect every organ in the body. In severe cases, it can cause life-threatening problems. What are the causes? This condition may be caused by: Not getting enough magnesium in your diet or not having enough healthy foods to eat (malnutrition). Problems with magnesium absorption in the intestines. Dehydration. Excessive use of alcohol. Vomiting. Severe or long-term (chronic) diarrhea. Some medicines, including medicines that make you urinate more often (diuretics). Certain diseases, such as kidney disease, diabetes, celiac disease, and overactive thyroid. What are the signs or symptoms? Symptoms of this condition include: Loss of appetite, nausea, and vomiting. Involuntary shaking or trembling of a body part (tremor). Muscle weakness or tingling in the arms and legs. Sudden tightening of muscles (muscle spasms). Confusion. Psychiatric issues, such as: Depression and irritability. Psychosis. A feeling of fluttering of the heart (palpitations). Seizures. These symptoms are more severe if magnesium levels drop suddenly. How is this diagnosed? This condition may be diagnosed based on: Your symptoms and medical history. A physical exam. Blood and urine tests. How is this treated? Treatment depends on the cause and the severity of the condition. It may be treated by: Taking a magnesium supplement. This can be taken in pill form. If the condition is severe, magnesium is usually given through an IV. Making changes to your diet. You may be directed to eat foods that have a lot of magnesium, such as green leafy vegetables, peas, beans, and nuts. Not drinking alcohol. If you are struggling not to drink, ask your health care provider for help. Follow these instructions at home: Eating and drinking     Make sure that your diet  includes foods with magnesium. Foods that have a lot of magnesium in them include: Green leafy vegetables, such as spinach and broccoli. Beans and peas. Nuts and seeds, such as almonds and sunflower seeds. Whole grains, such as whole grain bread and fortified cereals. Drink fluids that contain salts and minerals (electrolytes), such as sports drinks, when you are active. Do not drink alcohol. General instructions Take over-the-counter and prescription medicines only as told by your health care provider. Take magnesium supplements as directed if your health care provider tells you to take them. Have your magnesium levels monitored as told by your health care provider. Keep all follow-up visits. This is important. Contact a health care provider if: You get worse instead of better. Your symptoms return. Get help right away if: You develop severe muscle weakness. You have trouble breathing. You feel that your heart is racing. These symptoms may represent a serious problem that is an emergency. Do not wait to  see if the symptoms will go away. Get medical help right away. Call your local emergency services (911 in the [place] Do not drive yourself to the hospital. Summary Hypomagnesemia is a condition in which the level of magnesium in the blood is too low. Hypomagnesemia can affect every organ in the body. Treatment may include eating more foods that contain magnesium, taking magnesium supplements, and not drinking alcohol. Have your magnesium levels monitored as told by your health care provider. This information is not intended to replace advice given to you by your health care provider. Make sure you discuss any questions you have with your health care provider. Document Revised: 12/28/2020 Document Reviewed: 12/28/2020 Elsevier Patient Education  2024 [employer].

## 2023-07-06 NOTE — Addendum Note (Signed)
Addended by: Thornton Papas B on: 07/06/2023 09:38 AM   Modules accepted: Orders

## 2023-07-07 ENCOUNTER — Other Ambulatory Visit: Payer: Self-pay | Admitting: Oncology

## 2023-07-07 DIAGNOSIS — D649 Anemia, unspecified: Secondary | ICD-10-CM

## 2023-07-09 ENCOUNTER — Encounter: Payer: Self-pay | Admitting: Oncology

## 2023-07-09 ENCOUNTER — Other Ambulatory Visit: Payer: Self-pay | Admitting: Oncology

## 2023-07-09 DIAGNOSIS — C182 Malignant neoplasm of ascending colon: Secondary | ICD-10-CM

## 2023-07-20 ENCOUNTER — Encounter: Payer: Self-pay | Admitting: Nurse Practitioner

## 2023-07-20 ENCOUNTER — Inpatient Hospital Stay: Payer: BC Managed Care – PPO | Admitting: Nurse Practitioner

## 2023-07-20 ENCOUNTER — Inpatient Hospital Stay: Payer: BC Managed Care – PPO

## 2023-07-20 ENCOUNTER — Other Ambulatory Visit: Payer: Self-pay

## 2023-07-20 ENCOUNTER — Inpatient Hospital Stay: Payer: BC Managed Care – PPO | Attending: Oncology

## 2023-07-20 VITALS — BP 123/45 | HR 77 | Resp 18

## 2023-07-20 VITALS — BP 118/52 | HR 80 | Temp 98.1°F | Resp 18 | Ht 67.0 in | Wt 138.8 lb

## 2023-07-20 DIAGNOSIS — C182 Malignant neoplasm of ascending colon: Secondary | ICD-10-CM

## 2023-07-20 DIAGNOSIS — Z5112 Encounter for antineoplastic immunotherapy: Secondary | ICD-10-CM | POA: Insufficient documentation

## 2023-07-20 DIAGNOSIS — C787 Secondary malignant neoplasm of liver and intrahepatic bile duct: Secondary | ICD-10-CM | POA: Diagnosis not present

## 2023-07-20 DIAGNOSIS — G629 Polyneuropathy, unspecified: Secondary | ICD-10-CM | POA: Insufficient documentation

## 2023-07-20 DIAGNOSIS — K7460 Unspecified cirrhosis of liver: Secondary | ICD-10-CM | POA: Insufficient documentation

## 2023-07-20 DIAGNOSIS — D6481 Anemia due to antineoplastic chemotherapy: Secondary | ICD-10-CM | POA: Insufficient documentation

## 2023-07-20 LAB — CMP (CANCER CENTER ONLY)
ALT: 6 U/L (ref 0–44)
AST: 13 U/L — ABNORMAL LOW (ref 15–41)
Albumin: 3.4 g/dL — ABNORMAL LOW (ref 3.5–5.0)
Alkaline Phosphatase: 104 U/L (ref 38–126)
Anion gap: 6 (ref 5–15)
BUN: 18 mg/dL (ref 8–23)
CO2: 24 mmol/L (ref 22–32)
Calcium: 8.5 mg/dL — ABNORMAL LOW (ref 8.9–10.3)
Chloride: 107 mmol/L (ref 98–111)
Creatinine: 0.72 mg/dL (ref 0.44–1.00)
GFR, Estimated: >60 mL/min (ref >60)
Glucose, Bld: 85 mg/dL (ref 70–99)
Potassium: 4.5 mmol/L (ref 3.5–5.1)
Sodium: 137 mmol/L (ref 135–145)
Total Bilirubin: 0.4 mg/dL (ref <1.2)
Total Protein: 6.4 g/dL — ABNORMAL LOW (ref 6.5–8.1)

## 2023-07-20 LAB — CBC WITH DIFFERENTIAL (CANCER CENTER ONLY)
Abs Immature Granulocytes: 0.02 10*3/uL (ref 0.00–0.07)
Basophils Absolute: 0 10*3/uL (ref 0.0–0.1)
Basophils Relative: 1 %
Eosinophils Absolute: 0.2 10*3/uL (ref 0.0–0.5)
Eosinophils Relative: 3 %
HCT: 26.2 % — ABNORMAL LOW (ref 36.0–46.0)
Hemoglobin: 8.3 g/dL — ABNORMAL LOW (ref 12.0–15.0)
Immature Granulocytes: 1 %
Lymphocytes Relative: 13 %
Lymphs Abs: 0.6 10*3/uL — ABNORMAL LOW (ref 0.7–4.0)
MCH: 26.9 pg (ref 26.0–34.0)
MCHC: 31.7 g/dL (ref 30.0–36.0)
MCV: 85.1 fL (ref 80.0–100.0)
Monocytes Absolute: 0.6 10*3/uL (ref 0.1–1.0)
Monocytes Relative: 13 %
Neutro Abs: 3.1 10*3/uL (ref 1.7–7.7)
Neutrophils Relative %: 69 %
Platelet Count: 187 10*3/uL (ref 150–400)
RBC: 3.08 MIL/uL — ABNORMAL LOW (ref 3.87–5.11)
RDW: 14.7 % (ref 11.5–15.5)
WBC Count: 4.4 10*3/uL (ref 4.0–10.5)
nRBC: 0 % (ref 0.0–0.2)

## 2023-07-20 LAB — MAGNESIUM: Magnesium: 1.1 mg/dL — ABNORMAL LOW (ref 1.7–2.4)

## 2023-07-20 LAB — CEA (ACCESS): CEA (CHCC): 274.25 ng/mL — ABNORMAL HIGH (ref 0.00–5.00)

## 2023-07-20 MED ORDER — MAGNESIUM SULFATE 4 GM/100ML IV SOLN
4.0000 g | Freq: Once | INTRAVENOUS | Status: AC
  Administered 2023-07-20: 4 g via INTRAVENOUS
  Filled 2023-07-20: qty 100

## 2023-07-20 MED ORDER — SODIUM CHLORIDE 0.9 % IV SOLN
6.0000 mg/kg | Freq: Once | INTRAVENOUS | Status: AC
  Administered 2023-07-20: 400 mg via INTRAVENOUS
  Filled 2023-07-20: qty 20

## 2023-07-20 MED ORDER — SODIUM CHLORIDE 0.9% FLUSH
10.0000 mL | INTRAVENOUS | Status: DC | PRN
Start: 2023-07-20 — End: 2023-07-20
  Administered 2023-07-20: 10 mL

## 2023-07-20 MED ORDER — SODIUM CHLORIDE 0.9 % IV SOLN: Freq: Once | INTRAVENOUS | Status: AC

## 2023-07-20 MED ORDER — HEPARIN SOD (PORK) LOCK FLUSH 100 UNIT/ML IV SOLN
500.0000 [IU] | Freq: Once | INTRAVENOUS | Status: AC | PRN
  Administered 2023-07-20: 500 [IU]

## 2023-07-20 NOTE — Progress Notes (Signed)
[facility] OFFICE PROGRESS NOTE   Diagnosis:  Colon cancer  INTERVAL HISTORY:   Ms. [facility] returns as scheduled.  She completed another treatment with Panitumumab 07/06/2023.  She continues encorafenib.  She feels well.  No nausea or vomiting.  No mouth sores.  No diarrhea.  No pain.  Appetite described as "okay".  She thinks her weight is down a few pounds.  No rash.  No bleeding.  No lightheadedness.  No dizziness.  Objective:  Vital signs in last 24 hours:  Blood pressure (!) 118/52, pulse 80, temperature 98.1 F (36.7 C), temperature source Temporal, resp. rate 18, height 5\' 7"  (1.702 m), weight 138 lb 12.8 oz (63 kg), SpO2 100%.    HEENT: No thrush or ulcers. Resp: Lungs clear bilaterally. Cardio: Regular rate and rhythm. GI: No hepatosplenomegaly. Vascular: No leg edema. Skin: Ecchymoses scattered over forearms. Port-A-Cath without erythema.  Lab Results:  Lab Results  Component Value Date   WBC 4.4 07/20/2023   HGB 8.3 (L) 07/20/2023   HCT 26.2 (L) 07/20/2023   MCV 85.1 07/20/2023   PLT 187 07/20/2023   NEUTROABS 3.1 07/20/2023    Imaging:  No results found.  Medications: I have reviewed the patient's current medications.  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  Cirrhosis.  Innumerable liver lesions. Colonoscopy 12/18/2019-ulcerated partially obstructing large mass in the mid ascending colon.  Biopsy invasive adenocarcinoma; preserved expression major MMR proteins Upper endoscopy 12/18/2019-normal esophagus, stomach, examined duodenum CEA 12/19/2019-8,923 Biopsy liver lesion 12/23/2019-adenocarcinoma Foundation 1-K-ras wild-type; tumor mutation burden greater than or equal to 10; microsatellite stable; BRAF V600E Cycle 1 FOLFOX 01/01/2020 Cycle 2 FOLFOX 01/15/2020  Cycle 3 FOLFOX 01/29/2020, Udenyca added Cycle 4 FOLFOX  02/12/2020, Udenyca held Cycle 5 FOLFOX 02/26/2020, Udenyca Cycle 6 FOLFOX 03/11/2020, Udenyca held CT abdomen/pelvis 03/16/2020-stable diffuse liver lesions, stable strictured appearing ascending colon, possible pericolonic implant, vague left lower lobe nodule Cycle 1 FOLFIRI/Avastin 04/06/2020 Cycle 2 FOLFIRI/Avastin 04/21/2020 Cycle 3 FOLFIRI/Avastin 05/06/2020 Cycle 4 FOLFIRI/Avastin 05/20/2020 Cycle 5 FOLFIRI/Avastin 06/03/2020 CTs 06/15/2020-mild to moderate improvement in hepatic metastasis.  No new or progressive disease.  Narrowing within the proximal ascending colon with upstream mild cecal and terminal ileum dilatation, similar. Cycle 6 FOLFIRI/Avastin 06/17/2020 Cycle 7 FOLFIRI/Avastin 07/01/2020 Cycle 8 FOLFIRI/Avastin 07/15/2020 Cycle 9 FOLFIRI/Avastin 07/29/2020-5-FU bolus eliminated, 5-FU infusion and irinotecan dose reduced Cycle 10 FOLFIRI/Avastin 08/12/2020 CTs 08/23/2020- mild residual wall thickening involving the cecum/ascending colon.  Numerous liver metastases improved. Cycle 11 FOLFIRI/Avastin 08/25/2020 Cycle 12 FOLFIRI/Avastin 09/16/2020 Cycle 13 FOLFIRI/Avastin 10/07/2020 Cycle 14 FOLFIRI/Avastin 10/28/2020 Cycle 15 FOLFIRI/Avastin 11/18/2020 CT abdomen/pelvis 12/06/2020-mild improvement in multifocal hepatic metastases, wall thickening at the ascending colon with pericolonic inflammatory changes Cycle 16 FOLFIRI/Avastin 12/09/2020 Cycle 17 FOLFIRI/Avastin 12/30/2020 Cycle 18 FOLFIRI/Avastin 01/20/2021 Cycle 19 FOLFIRI/Avastin 02/10/2021 Cycle 20 FOLFIRI/Avastin 03/03/2021 Cycle 21 FOLFIRI/Avastin 03/24/2021 Cycle 22 FOLFIRI/Avastin 04/14/2021 CTs 05/04/2021-slight improvement in hepatic metastatic disease. Cycle 23 FOLFIRI/Avastin 05/05/2021 Cycle 24 FOLFIRI/Avastin 05/25/2021 Cycle 25 FOLFIRI/Avastin 06/15/2021 Cycle 26 FOLFIRI/Avastin 07/13/2021 Cycle 27 FOLFIRI/Avastin 08/02/2021 CTs 08/30/2021-majority of liver lesions are unchanged in size, 1 lesion adjacent to the gallbladder has  increased, no adenopathy, persistent pericolonic stranding and peritoneal thickening at the cecum Progressive rise in CEA 08/31/2021-treatment changed to Xeloda/bevacizumab, began [place] 09/22/2021 CEA stable 11/03/2021 Xeloda/bevacizumab continued CTs 01/10/2022-increase in size of some of the liver lesions, others are stable, new tiny right lower lobe nodule, increased dilation of distal small bowel with increased  wall thickening at the terminal ileum and ileocecal valve with 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, stable bilateral paracolic gutter peritoneal thickening Panitumumab/ Encorafenib 01/12/2022 (encorafenib 01/19/2022) Panitumumab 02/02/2022 Panitumumab 02/20/2022 Panitumumab 03/06/2022 Panitumumab 03/20/2022 Panitumumab 04/03/2022 CTs 04/14/2022-decrease size of liver metastases, new borderline subcarinal adenopathy, improved peritoneal thickening, cirrhosis or pseudocirrhosis, persistent dilated cecum Encorafenib and Panitumumab continued Panitumumab 04/18/2022 Panitumumab 05/05/2022 Panitumumab 05/19/2022 Panitumumab 06/02/2022 Panitumumab 06/16/2022, 4g IV mag and begin oral mag po once daily  Panitumumab 06/30/2022 Panitumumab 07/14/2022, 2 g IV magnesium, increase oral magnesium to 400 mg twice daily Panitumumab 07/28/2022, 2 g IV magnesium CTs 07/28/2022-no change in hepatic metastases, no new signs of metastatic disease, persistent mural thickening at the terminal ileum/cecum mild splenomegaly unchanged Panitumumab 08/18/2022 Panitumumab 09/01/2022 Panitumumab 09/15/2022 Panitumumab 09/29/2022 Panitumumab 10/13/2022 Panitumumab 10/27/2022 CTs 11/03/2022-unchanged hepatic metastases, no new lesions, 3.7 cm soft tissue density at the medial left kidney etiology unclear, improvement in wall thickening at the ileocecal junction development of appendiceal dilatation Panitumumab 11/10/2022 Panitumumab 11/24/2022 Panitumumab 12/08/2022 Panitumumab 12/22/2022 Panitumumab 01/05/2023 Panitumumab  02/02/2023 Panitumumab 02/16/2023 CTs 03/01/2023-unchanged cecal mass. Enlargement of left liver lesion, other liver lesions unchanged. Near complete resolution of soft tissue left renal pelvis. No pericardial effusion. Panitumumab 03/02/2023 Panitumumab 03/16/2023 Panitumumab 03/30/2023 Panitumumab 04/13/2023 Panitumumab 04/27/2023 Panitumumab 05/11/2023 CTs 05/19/2023-no evidence of metastatic disease in the chest, progressive porta hepatis adenopathy and enlargement of a coalescent left liver lesion, possible mildly enlarging left liver lesion, right colon mass less obvious-slightly improved? Panitumumab 05/25/2023, Encorafenib continued Panitumumab 06/08/2023 Panitumumab 06/26/2023 Panitumumab 07/06/2023 Panitumumab 07/20/2023 Anemia secondary to GI blood loss, and chemotherapy, on oral iron BID  Red cell transfusion 02/02/2023 Cirrhosis 12/26/2019-hospital admission for right lower extremity DVT and GI bleed, treated with heparin anticoagulation beginning 12/26/2019, converted to Lovenox at discharge 12/29/2019 Lovenox reduced to 40 mg daily 02/10/2021 secondary to bruising and nosebleeding History of low-grade fever-likely "tumor" fever COVID-19 infection January 2021 Admission 01/19/2023 with dyspnea/orthopnea, pericardial effusion, EKG changes suggestive of pericarditis Echocardiogram 01/20/2023-LVEF 60-65%, small to moderate circumferential pericardial effusion without tamponade physiology Echocardiogram 02/19/2022-LVEF 70-75%; trivial pericardial effusion. Anemia secondary to chemotherapy, chronic disease, and phlebotomy 2 units of packed red blood cells 02/03/2023    Disposition: Ms. Schmall appears stable.  She continues encorafenib and every 2-week Panitumumab.  There is no clinical evidence of disease progression.  Plan to proceed with Panitumumab today as scheduled.  Restaging CTs prior to next office visit.  CBC and chemistry panel reviewed.  Labs adequate to proceed as above.  She has  progressive anemia.  She is asymptomatic.  She will return for a follow-up CBC in 1 week, transfusion support as needed.  She will contact the office with bleeding.  Magnesium level remains low.  She will receive IV magnesium.  She will return for follow-up and treatment in 2 weeks.  We are available to see her sooner if needed.  Lonna Cobb ANP/GNP-BC   07/20/2023  8:54 AM

## 2023-07-20 NOTE — Patient Instructions (Signed)
[facility]   Discharge Instructions: Thank you for choosing [facility] to provide your oncology and hematology care.   If you have a lab appointment with the Cancer Center, please go directly to the Cancer Center and check in at the registration area.   Wear comfortable clothing and clothing appropriate for easy access to any Portacath or PICC line.   We strive to give you quality time with your provider. You may need to reschedule your appointment if you arrive late (15 or more minutes).  Arriving late affects you and other patients whose appointments are after yours.  Also, if you miss three or more appointments without notifying the office, you may be dismissed from the clinic at the provider's discretion.      For prescription refill requests, have your pharmacy contact our office and allow 72 hours for refills to be completed.    Today you received the following chemotherapy and/or immunotherapy agents Panitumumab (VECTIBIX).      To help prevent nausea and vomiting after your treatment, we encourage you to take your nausea medication as directed.  BELOW ARE SYMPTOMS THAT SHOULD BE REPORTED IMMEDIATELY: *FEVER GREATER THAN 100.4 F (38 C) OR HIGHER *CHILLS OR SWEATING *NAUSEA AND VOMITING THAT IS NOT CONTROLLED WITH YOUR NAUSEA MEDICATION *UNUSUAL SHORTNESS OF BREATH *UNUSUAL BRUISING OR BLEEDING *URINARY PROBLEMS (pain or burning when urinating, or frequent urination) *BOWEL PROBLEMS (unusual diarrhea, constipation, pain near the anus) TENDERNESS IN MOUTH AND THROAT WITH OR WITHOUT PRESENCE OF ULCERS (sore throat, sores in mouth, or a toothache) UNUSUAL RASH, SWELLING OR PAIN  UNUSUAL VAGINAL DISCHARGE OR ITCHING   Items with * indicate a potential emergency and should be followed up as soon as possible or go to the Emergency Department if any problems should occur.  Please show the CHEMOTHERAPY ALERT CARD or  IMMUNOTHERAPY ALERT CARD at check-in to the Emergency Department and triage nurse.  Should you have questions after your visit or need to cancel or reschedule your appointment, please [facility]  Dept: [phone]  and follow the prompts.  Office hours are 8:00 a.m. to 4:30 p.m. Monday - Friday. Please note that voicemails left after 4:00 p.m. may not be returned until the following business day.  We are closed weekends and major holidays. You have access to a nurse at all times for urgent questions. Please call the main number to the clinic Dept: [phone] and follow the prompts.   For any non-urgent questions, you may also contact your provider using MyChart. We now offer e-Visits for anyone 27 and older to request care online for non-urgent symptoms. For details visit [email or web address].   Also download the MyChart app! Go to the app store, search "MyChart", open the app, select [facility], and log in with your MyChart username and password.  Panitumumab Injection What is this medication? PANITUMUMAB (pan i TOOM ue mab) treats colorectal cancer. It works by blocking a protein that causes cancer cells to grow and multiply. This helps to slow or stop the spread of cancer cells. It is a monoclonal antibody. This medicine may be used for other purposes; ask your health care provider or pharmacist if you have questions. COMMON BRAND NAME(S): Vectibix What should I tell my care team before I take this medication? They need to know if you have any of these conditions: Eye disease Low  levels of magnesium in the blood Lung disease An unusual or allergic reaction to panitumumab, other medications, foods, dyes, or preservatives Pregnant or trying to get pregnant Breast-feeding How should I use this medication? This medication is injected into a vein. It is given by your care team in a hospital or clinic setting. Talk to your  care team about the use of this medication in children. Special care may be needed. Overdosage: If you think you have taken too much of this medicine contact a poison control center or emergency room at once. NOTE: This medicine is only for you. Do not share this medicine with others. What if I miss a dose? Keep appointments for follow-up doses. It is important not to miss your dose. Call your care team if you are unable to keep an appointment. What may interact with this medication? Bevacizumab This list may not describe all possible interactions. Give your health care provider a list of all the medicines, herbs, non-prescription drugs, or dietary supplements you use. Also tell them if you smoke, drink alcohol, or use illegal drugs. Some items may interact with your medicine. What should I watch for while using this medication? Your condition will be monitored carefully while you are receiving this medication. This medication may make you feel generally unwell. This is not uncommon as chemotherapy can affect healthy cells as well as cancer cells. Report any side effects. Continue your course of treatment even though you feel ill unless your care team tells you to stop. This medication can make you more sensitive to the sun. Keep out of the sun while receiving this medication and for 2 months after stopping therapy. If you cannot avoid being in the sun, wear protective clothing and sunscreen. Do not use sun lamps, tanning beds, or tanning booths. Check with your care team if you have severe diarrhea, nausea, and vomiting or if you sweat a lot. The loss of too much body fluid may make it dangerous for you to take this medication. This medication may cause serious skin reactions. They can happen weeks to months after starting the medication. Contact your care team right away if you notice fevers or flu-like symptoms with a rash. The rash may be red or purple and then turn into blisters or peeling of the  skin. You may also notice a red rash with swelling of the face, lips, or lymph nodes in your neck or under your arms. Talk to your care team if you may be pregnant. Serious birth defects can occur if you take this medication during pregnancy and for 2 months after the last dose. Contraception is recommended while taking this medication and for 2 months after the last dose. Your care team can help you find the option that works for you. Do not breastfeed while taking this medication and for 2 months after the last dose. This medication may cause infertility. Talk to your care team if you are concerned about your fertility. What side effects may I notice from receiving this medication? Side effects that you should report to your care team as soon as possible: Allergic reactions--skin rash, itching, hives, swelling of the face, lips, tongue, or throat Dry cough, shortness of breath or trouble breathing Eye pain, redness, irritation, or discharge with blurry or decreased vision Infusion reactions--chest pain, shortness of breath or trouble breathing, feeling faint or lightheaded Low magnesium level--muscle pain or cramps, unusual weakness or fatigue, fast or irregular heartbeat, tremors Low potassium level--muscle pain or cramps, unusual weakness  or fatigue, fast or irregular heartbeat, constipation Redness, blistering, peeling, or loosening of the skin, including inside the mouth Skin reactions on sun-exposed areas Side effects that usually do not require medical attention (report to your care team if they continue or are bothersome): Change in nail shape, thickness, or color Diarrhea Dry skin Fatigue Nausea Vomiting This list may not describe all possible side effects. Call your doctor for medical advice about side effects. You may report side effects to FDA at 1-800-FDA-1088. Where should I keep my medication? This medication is given in a hospital or clinic. It will not be stored at  home. NOTE: This sheet is a summary. It may not cover all possible information. If you have questions about this medicine, talk to your doctor, pharmacist, or health care provider.  2024 Elsevier/Gold Standard (2021-12-14 00:00:00)   Magnesium Sulfate Injection What is this medication? MAGNESIUM SULFATE (mag NEE zee um SUL fate) prevents and treats low levels of magnesium in your body. It may also be used to prevent and treat seizures during pregnancy in people with high blood pressure disorders, such as preeclampsia or eclampsia. Magnesium plays an important role in maintaining the health of your muscles and nervous system. This medicine may be used for other purposes; ask your health care provider or pharmacist if you have questions. What should I tell my care team before I take this medication? They need to know if you have any of these conditions: Heart disease History of irregular heart beat Kidney disease An unusual or allergic reaction to magnesium sulfate, medications, foods, dyes, or preservatives Pregnant or trying to get pregnant Breast-feeding How should I use this medication? This medication is for infusion into a vein. It is given in a hospital or clinic setting. Talk to your care team about the use of this medication in children. While this medication may be prescribed for selected conditions, precautions do apply. Overdosage: If you think you have taken too much of this medicine contact a poison control center or emergency room at once. NOTE: This medicine is only for you. Do not share this medicine with others. What if I miss a dose? This does not apply. What may interact with this medication? Certain medications for anxiety or sleep Certain medications for seizures, such phenobarbital Digoxin Medications that relax muscles for surgery Narcotic medications for pain This list may not describe all possible interactions. Give your health care provider a list of all the  medicines, herbs, non-prescription drugs, or dietary supplements you use. Also tell them if you smoke, drink alcohol, or use illegal drugs. Some items may interact with your medicine. What should I watch for while using this medication? Your condition will be monitored carefully while you are receiving this medication. You may need blood work done while you are receiving this medication. What side effects may I notice from receiving this medication? Side effects that you should report to your care team as soon as possible: Allergic reactions--skin rash, itching, hives, swelling of the face, lips, tongue, or throat High magnesium level--confusion, drowsiness, facial flushing, redness, sweating, muscle weakness, fast or irregular heartbeat, trouble breathing Low blood pressure--dizziness, feeling faint or lightheaded, blurry vision Side effects that usually do not require medical attention (report to your care team if they continue or are bothersome): Headache Nausea This list may not describe all possible side effects. Call your doctor for medical advice about side effects. You may report side effects to FDA at 1-800-FDA-1088. Where should I keep my medication? This  medication is given in a hospital or clinic and will not be stored at home. NOTE: This sheet is a summary. It may not cover all possible information. If you have questions about this medicine, talk to your doctor, pharmacist, or health care provider.  2024 Elsevier/Gold Standard (2021-04-13 00:00:00)

## 2023-07-20 NOTE — Progress Notes (Signed)
Patient seen by Lonna Cobb NP today  Vitals are within treatment parameters:Yes   Labs are within treatment parameters: No (Please specify and give further instructions.) Mag 1.1   Treatment plan has been signed: Yes   Per physician team, Patient is ready for treatment and there are NO modifications to the treatment plan. Patient will received 4 grams of mag.

## 2023-07-22 ENCOUNTER — Ambulatory Visit (HOSPITAL_BASED_OUTPATIENT_CLINIC_OR_DEPARTMENT_OTHER)
Admission: RE | Admit: 2023-07-22 | Discharge: 2023-07-22 | Disposition: A | Discharge status: Home / Self Care | Payer: BC Managed Care – PPO | Source: Ambulatory Visit | Attending: Nurse Practitioner | Admitting: Nurse Practitioner

## 2023-07-22 DIAGNOSIS — C182 Malignant neoplasm of ascending colon: Secondary | ICD-10-CM | POA: Diagnosis present

## 2023-07-22 MED ORDER — IOHEXOL 300 MG/ML  SOLN
80.0000 mL | Freq: Once | INTRAMUSCULAR | Status: AC | PRN
  Administered 2023-07-22: 80 mL via INTRAVENOUS

## 2023-07-24 ENCOUNTER — Ambulatory Visit: Payer: BC Managed Care – PPO

## 2023-07-24 ENCOUNTER — Other Ambulatory Visit: Payer: BC Managed Care – PPO

## 2023-07-24 ENCOUNTER — Ambulatory Visit: Payer: BC Managed Care – PPO | Admitting: Nurse Practitioner

## 2023-07-27 ENCOUNTER — Inpatient Hospital Stay: Payer: BC Managed Care – PPO

## 2023-07-27 ENCOUNTER — Telehealth: Payer: Self-pay | Admitting: *Deleted

## 2023-07-27 DIAGNOSIS — Z5112 Encounter for antineoplastic immunotherapy: Secondary | ICD-10-CM | POA: Diagnosis not present

## 2023-07-27 DIAGNOSIS — C182 Malignant neoplasm of ascending colon: Secondary | ICD-10-CM

## 2023-07-27 LAB — CBC WITH DIFFERENTIAL (CANCER CENTER ONLY)
Abs Immature Granulocytes: 0.01 10*3/uL (ref 0.00–0.07)
Basophils Absolute: 0 10*3/uL (ref 0.0–0.1)
Basophils Relative: 1 %
Eosinophils Absolute: 0.2 10*3/uL (ref 0.0–0.5)
Eosinophils Relative: 3 %
HCT: 27.8 % — ABNORMAL LOW (ref 36.0–46.0)
Hemoglobin: 8.7 g/dL — ABNORMAL LOW (ref 12.0–15.0)
Immature Granulocytes: 0 %
Lymphocytes Relative: 10 %
Lymphs Abs: 0.6 10*3/uL — ABNORMAL LOW (ref 0.7–4.0)
MCH: 26.7 pg (ref 26.0–34.0)
MCHC: 31.3 g/dL (ref 30.0–36.0)
MCV: 85.3 fL (ref 80.0–100.0)
Monocytes Absolute: 0.7 10*3/uL (ref 0.1–1.0)
Monocytes Relative: 12 %
Neutro Abs: 4.7 10*3/uL (ref 1.7–7.7)
Neutrophils Relative %: 74 %
Platelet Count: 200 10*3/uL (ref 150–400)
RBC: 3.26 MIL/uL — ABNORMAL LOW (ref 3.87–5.11)
RDW: 14.9 % (ref 11.5–15.5)
WBC Count: 6.2 10*3/uL (ref 4.0–10.5)
nRBC: 0 % (ref 0.0–0.2)

## 2023-07-27 LAB — SAMPLE TO BLOOD BANK

## 2023-07-27 NOTE — Telephone Encounter (Signed)
Hgb today returned at 8.7 (slightly improved). Does not require transfusion. Follow up as scheduled on 12/20.  Unable to reach patient via phone. Sent above message via MyChart.

## 2023-07-28 ENCOUNTER — Other Ambulatory Visit: Payer: Self-pay | Admitting: Oncology

## 2023-07-30 ENCOUNTER — Other Ambulatory Visit: Payer: Self-pay | Admitting: Oncology

## 2023-07-30 DIAGNOSIS — C182 Malignant neoplasm of ascending colon: Secondary | ICD-10-CM

## 2023-08-03 ENCOUNTER — Telehealth: Payer: Self-pay | Admitting: Pharmacist

## 2023-08-03 ENCOUNTER — Encounter: Payer: Self-pay | Admitting: *Deleted

## 2023-08-03 ENCOUNTER — Inpatient Hospital Stay: Payer: BC Managed Care – PPO

## 2023-08-03 ENCOUNTER — Other Ambulatory Visit (HOSPITAL_COMMUNITY): Payer: Self-pay

## 2023-08-03 ENCOUNTER — Other Ambulatory Visit: Payer: Self-pay

## 2023-08-03 ENCOUNTER — Inpatient Hospital Stay: Payer: BC Managed Care – PPO | Admitting: Nurse Practitioner

## 2023-08-03 ENCOUNTER — Telehealth: Payer: Self-pay

## 2023-08-03 ENCOUNTER — Encounter: Payer: Self-pay | Admitting: Nurse Practitioner

## 2023-08-03 VITALS — BP 119/54 | HR 88 | Temp 98.1°F | Resp 18 | Ht 67.0 in | Wt 133.5 lb

## 2023-08-03 VITALS — BP 128/52 | HR 79 | Resp 18

## 2023-08-03 DIAGNOSIS — C182 Malignant neoplasm of ascending colon: Secondary | ICD-10-CM

## 2023-08-03 DIAGNOSIS — Z95828 Presence of other vascular implants and grafts: Secondary | ICD-10-CM

## 2023-08-03 DIAGNOSIS — Z5112 Encounter for antineoplastic immunotherapy: Secondary | ICD-10-CM | POA: Diagnosis not present

## 2023-08-03 LAB — CBC WITH DIFFERENTIAL (CANCER CENTER ONLY)
Abs Immature Granulocytes: 0.02 10*3/uL (ref 0.00–0.07)
Basophils Absolute: 0.1 10*3/uL (ref 0.0–0.1)
Basophils Relative: 1 %
Eosinophils Absolute: 0.2 10*3/uL (ref 0.0–0.5)
Eosinophils Relative: 3 %
HCT: 27.6 % — ABNORMAL LOW (ref 36.0–46.0)
Hemoglobin: 8.7 g/dL — ABNORMAL LOW (ref 12.0–15.0)
Immature Granulocytes: 0 %
Lymphocytes Relative: 11 %
Lymphs Abs: 0.7 10*3/uL (ref 0.7–4.0)
MCH: 26.3 pg (ref 26.0–34.0)
MCHC: 31.5 g/dL (ref 30.0–36.0)
MCV: 83.4 fL (ref 80.0–100.0)
Monocytes Absolute: 0.6 10*3/uL (ref 0.1–1.0)
Monocytes Relative: 9 %
Neutro Abs: 4.9 10*3/uL (ref 1.7–7.7)
Neutrophils Relative %: 76 %
Platelet Count: 229 10*3/uL (ref 150–400)
RBC: 3.31 MIL/uL — ABNORMAL LOW (ref 3.87–5.11)
RDW: 14.9 % (ref 11.5–15.5)
WBC Count: 6.4 10*3/uL (ref 4.0–10.5)
nRBC: 0 % (ref 0.0–0.2)

## 2023-08-03 LAB — CMP (CANCER CENTER ONLY)
ALT: 7 U/L (ref 0–44)
AST: 16 U/L (ref 15–41)
Albumin: 3.7 g/dL (ref 3.5–5.0)
Alkaline Phosphatase: 135 U/L — ABNORMAL HIGH (ref 38–126)
Anion gap: 10 (ref 5–15)
BUN: 18 mg/dL (ref 8–23)
CO2: 22 mmol/L (ref 22–32)
Calcium: 8.8 mg/dL — ABNORMAL LOW (ref 8.9–10.3)
Chloride: 107 mmol/L (ref 98–111)
Creatinine: 0.82 mg/dL (ref 0.44–1.00)
GFR, Estimated: >60 mL/min (ref >60)
Glucose, Bld: 135 mg/dL — ABNORMAL HIGH (ref 70–99)
Potassium: 4.1 mmol/L (ref 3.5–5.1)
Sodium: 139 mmol/L (ref 135–145)
Total Bilirubin: 0.5 mg/dL (ref <1.2)
Total Protein: 7 g/dL (ref 6.5–8.1)

## 2023-08-03 LAB — MAGNESIUM: Magnesium: 1.2 mg/dL — ABNORMAL LOW (ref 1.7–2.4)

## 2023-08-03 LAB — CEA (ACCESS): CEA (CHCC): 384.57 ng/mL — ABNORMAL HIGH (ref 0.00–5.00)

## 2023-08-03 MED ORDER — MAGNESIUM SULFATE 4 GM/100ML IV SOLN
4.0000 g | Freq: Once | INTRAVENOUS | Status: AC
Start: 2023-08-03 — End: 2023-08-03
  Administered 2023-08-03: 4 g via INTRAVENOUS
  Filled 2023-08-03: qty 100

## 2023-08-03 MED ORDER — OXYCODONE-ACETAMINOPHEN 5-325 MG PO TABS: 1.0000 | ORAL_TABLET | Freq: Three times a day (TID) | ORAL | 0 refills | Status: DC | PRN

## 2023-08-03 MED ORDER — LONSURF 20-8.19 MG PO TABS: ORAL_TABLET | ORAL | 0 refills | Status: DC

## 2023-08-03 MED ORDER — LONSURF 20-8.19 MG PO TABS: 40.0000 mg | ORAL_TABLET | Freq: Two times a day (BID) | ORAL | 0 refills | Status: DC

## 2023-08-03 MED ORDER — SODIUM CHLORIDE 0.9 % IV SOLN: Freq: Once | INTRAVENOUS | Status: AC

## 2023-08-03 MED ORDER — HEPARIN SOD (PORK) LOCK FLUSH 100 UNIT/ML IV SOLN
500.0000 [IU] | Freq: Once | INTRAVENOUS | Status: AC
  Administered 2023-08-03: 500 [IU] via INTRAVENOUS

## 2023-08-03 MED ORDER — ENOXAPARIN SODIUM 40 MG/0.4ML IJ SOSY: 40.0000 mg | PREFILLED_SYRINGE | Freq: Every day | INTRAMUSCULAR | 6 refills | Status: DC

## 2023-08-03 MED ORDER — SODIUM CHLORIDE 0.9% FLUSH
10.0000 mL | Freq: Once | INTRAVENOUS | Status: AC
  Administered 2023-08-03: 10 mL via INTRAVENOUS

## 2023-08-03 NOTE — Patient Instructions (Signed)
Hypomagnesemia Hypomagnesemia is a condition in which the level of magnesium in the blood is too low. Magnesium is a mineral that is found in many foods. It is used in many different processes in the body. Hypomagnesemia can affect every organ in the body. In severe cases, it can cause life-threatening problems. What are the causes? This condition may be caused by: Not getting enough magnesium in your diet or not having enough healthy foods to eat (malnutrition). Problems with magnesium absorption in the intestines. Dehydration. Excessive use of alcohol. Vomiting. Severe or long-term (chronic) diarrhea. Some medicines, including medicines that make you urinate more often (diuretics). Certain diseases, such as kidney disease, diabetes, celiac disease, and overactive thyroid. What are the signs or symptoms? Symptoms of this condition include: Loss of appetite, nausea, and vomiting. Involuntary shaking or trembling of a body part (tremor). Muscle weakness or tingling in the arms and legs. Sudden tightening of muscles (muscle spasms). Confusion. Psychiatric issues, such as: Depression and irritability. Psychosis. A feeling of fluttering of the heart (palpitations). Seizures. These symptoms are more severe if magnesium levels drop suddenly. How is this diagnosed? This condition may be diagnosed based on: Your symptoms and medical history. A physical exam. Blood and urine tests. How is this treated? Treatment depends on the cause and the severity of the condition. It may be treated by: Taking a magnesium supplement. This can be taken in pill form. If the condition is severe, magnesium is usually given through an IV. Making changes to your diet. You may be directed to eat foods that have a lot of magnesium, such as green leafy vegetables, peas, beans, and nuts. Not drinking alcohol. If you are struggling not to drink, ask your health care provider for help. Follow these instructions at  home: Eating and drinking     Make sure that your diet includes foods with magnesium. Foods that have a lot of magnesium in them include: Green leafy vegetables, such as spinach and broccoli. Beans and peas. Nuts and seeds, such as almonds and sunflower seeds. Whole grains, such as whole grain bread and fortified cereals. Drink fluids that contain salts and minerals (electrolytes), such as sports drinks, when you are active. Do not drink alcohol. General instructions Take over-the-counter and prescription medicines only as told by your health care provider. Take magnesium supplements as directed if your health care provider tells you to take them. Have your magnesium levels monitored as told by your health care provider. Keep all follow-up visits. This is important. Contact a health care provider if: You get worse instead of better. Your symptoms return. Get help right away if: You develop severe muscle weakness. You have trouble breathing. You feel that your heart is racing. These symptoms may represent a serious problem that is an emergency. Do not wait to see if the symptoms will go away. Get medical help right away. Call your local emergency services (911 in the [place] Do not drive yourself to the hospital. Summary Hypomagnesemia is a condition in which the level of magnesium in the blood is too low. Hypomagnesemia can affect every organ in the body. Treatment may include eating more foods that contain magnesium, taking magnesium supplements, and not drinking alcohol. Have your magnesium levels monitored as told by your health care provider. This information is not intended to replace advice given to you by your health care provider. Make sure you discuss any questions you have with your health care provider. Document Revised: 12/28/2020 Document Reviewed: 12/28/2020 Elsevier Patient Education    2024 Elsevier Inc.  

## 2023-08-03 NOTE — Patient Instructions (Signed)

## 2023-08-03 NOTE — Progress Notes (Signed)
Referral faxed to [facility] oncology for Dr Johny Blamer or Dr Maryruth Hancock at [phone]

## 2023-08-03 NOTE — Telephone Encounter (Signed)
Clinical Pharmacist Practitioner Encounter  Due to insurance restriction the medication could not be filled at [facility]. Prescription has been e-scribed to [facility].  Supportive information was faxed to [facility]. We will continue to follow medication access.    Remi Haggard, PharmD, BCPS, [facility] Hematology/Oncology Clinical Pharmacist [facility] [phone]  08/03/2023 5:28 PM

## 2023-08-03 NOTE — Telephone Encounter (Signed)
Clinical Pharmacist Practitioner Encounter   Received new prescription for Lonsurf (trifluridine/tipiracil) for the treatment of progressive metastatic colon cancer in conjunction with bevacizumab, planned duration until disease progression or unacceptable drug toxicity. Planned start 08/13/23.   CMP from 08/03/23 assessed, no relevant lab abnormalities. Prescription dose and frequency assessed.   Current medication list in Epic reviewed, no DDIs with Lonsurf identified.   Evaluated chart and no patient barriers to medication adherence identified.   Prescription has been e-scribed to the [facility] for benefits analysis and approval.  Oral Oncology Clinic will continue to follow for insurance authorization, copayment issues, initial counseling and start date.  Patient agreed to treatment on 08/03/23 per MD documentation.  Remi Haggard, PharmD, BCPS, BCOP, CPP Hematology/Oncology Clinical Pharmacist Practitioner [place] [phone]  08/03/2023 11:55 AM

## 2023-08-03 NOTE — Telephone Encounter (Signed)
Oral Oncology Patient Advocate Encounter  Prior Authorization for Cyndi Lennert has been approved.    PA# [id number]  Effective dates: 07/04/23 through 08/02/24  Patient must fill through [facility] per insurance.    Ardeen Fillers, CPhT Oncology Pharmacy Patient Advocate  [facility]  [phone] (phone) [phone] (fax) 08/03/2023 1:36 PM

## 2023-08-03 NOTE — Telephone Encounter (Signed)
Oral Oncology Patient Advocate Encounter  New authorization   Received notification that prior authorization for Lonsurf is required.   PA submitted on 08/03/23  Key [id number]  Status is pending     Ardeen Fillers, CPhT Oncology Pharmacy Patient Advocate  [facility]  [phone] (phone) [phone] (fax) 08/03/2023 1:04 PM

## 2023-08-03 NOTE — Progress Notes (Signed)
DISCONTINUE OFF PATHWAY REGIMEN - Colorectal   [id number]:FOLFIRI + Bevacizumab (Leucovorin IV D1 + Fluorouracil IV D1/CIV D1,2 + Irinotecan IV D1 + Bevacizumab IV D1) q14 Days:   A cycle is every 14 days:     Bevacizumab-xxxx      Irinotecan      Leucovorin      Fluorouracil      Fluorouracil   **Always confirm dose/schedule in your pharmacy ordering system**  PRIOR TREATMENT: Off Pathway: FOLFIRI + Bevacizumab (Leucovorin IV D1 + Fluorouracil IV D1/CIV D1,2 + Irinotecan IV D1 + Bevacizumab IV D1) q14 Days  START OFF PATHWAY REGIMEN - Colorectal   [id number]:Bevacizumab 5 mg/kg IV D1,15 + Trifluridine/Tipiracil 35 mg/m2 PO BID D1-5, 8-12 q28 Days:   A cycle is every 28 days:     Trifluridine and tipiracil      Bevacizumab-xxxx   **Always confirm dose/schedule in your pharmacy ordering system**  Patient Characteristics: Distant Metastases, Nonsurgical Candidate, 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

## 2023-08-03 NOTE — Progress Notes (Signed)
Patient seen by Lonna Cobb NP today  Vitals are within treatment parameters:Yes   Labs are within treatment parameters: Yes Magnesium 1.2 Treatment plan has been signed: Yes   Per physician team, Patient will not be receiving treatment today. Patient will received 4g of magnesium.

## 2023-08-03 NOTE — Progress Notes (Signed)
[facility] OFFICE PROGRESS NOTE   Diagnosis: Colon cancer  INTERVAL HISTORY:   Ms. Moessner returns as scheduled.  She continues encorafenib.  She completed another treatment with Panitumumab 07/20/2023.  She noted increase with pain at the upper abdomen and back/hips.  She thinks she feels a mass of the upper abdomen.  No nausea or vomiting.  No diarrhea.  No rash.  Neuropathy symptoms in the feet.  Objective:  Vital signs in last 24 hours:  Blood pressure (!) 119/54, pulse 88, temperature 98.1 F (36.7 C), temperature source Temporal, resp. rate 18, height 5\' 7"  (1.702 m), weight 133 lb 8 oz (60.6 kg), SpO2 100%.    HEENT: No thrush or ulcers. Resp: Lungs clear bilaterally. Cardio: Regular rate and rhythm. GI: Liver palpable right upper quadrant extending to midline. Vascular: No leg edema. Skin: Ecchymoses scattered over forearms. Port-A-Cath without erythema.  Lab Results:  Lab Results  Component Value Date   WBC 6.4 08/03/2023   HGB 8.7 (L) 08/03/2023   HCT 27.6 (L) 08/03/2023   MCV 83.4 08/03/2023   PLT 229 08/03/2023   NEUTROABS 4.9 08/03/2023    Imaging:  No results found.  Medications: I have reviewed the patient's current medications.  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  Cirrhosis.  Innumerable liver lesions. Colonoscopy 12/18/2019-ulcerated partially obstructing large mass in the mid ascending colon.  Biopsy invasive adenocarcinoma; preserved expression major MMR proteins Upper endoscopy 12/18/2019-normal esophagus, stomach, examined duodenum CEA 12/19/2019-8,923 Biopsy liver lesion 12/23/2019-adenocarcinoma Foundation 1-K-ras wild-type; tumor mutation burden greater than or equal to 10; microsatellite stable; BRAF V600E Cycle 1 FOLFOX 01/01/2020 Cycle 2 FOLFOX 01/15/2020  Cycle 3 FOLFOX 01/29/2020, Udenyca added Cycle 4  FOLFOX 02/12/2020, Udenyca held Cycle 5 FOLFOX 02/26/2020, Udenyca Cycle 6 FOLFOX 03/11/2020, Udenyca held CT abdomen/pelvis 03/16/2020-stable diffuse liver lesions, stable strictured appearing ascending colon, possible pericolonic implant, vague left lower lobe nodule Cycle 1 FOLFIRI/Avastin 04/06/2020 Cycle 2 FOLFIRI/Avastin 04/21/2020 Cycle 3 FOLFIRI/Avastin 05/06/2020 Cycle 4 FOLFIRI/Avastin 05/20/2020 Cycle 5 FOLFIRI/Avastin 06/03/2020 CTs 06/15/2020-mild to moderate improvement in hepatic metastasis.  No new or progressive disease.  Narrowing within the proximal ascending colon with upstream mild cecal and terminal ileum dilatation, similar. Cycle 6 FOLFIRI/Avastin 06/17/2020 Cycle 7 FOLFIRI/Avastin 07/01/2020 Cycle 8 FOLFIRI/Avastin 07/15/2020 Cycle 9 FOLFIRI/Avastin 07/29/2020-5-FU bolus eliminated, 5-FU infusion and irinotecan dose reduced Cycle 10 FOLFIRI/Avastin 08/12/2020 CTs 08/23/2020- mild residual wall thickening involving the cecum/ascending colon.  Numerous liver metastases improved. Cycle 11 FOLFIRI/Avastin 08/25/2020 Cycle 12 FOLFIRI/Avastin 09/16/2020 Cycle 13 FOLFIRI/Avastin 10/07/2020 Cycle 14 FOLFIRI/Avastin 10/28/2020 Cycle 15 FOLFIRI/Avastin 11/18/2020 CT abdomen/pelvis 12/06/2020-mild improvement in multifocal hepatic metastases, wall thickening at the ascending colon with pericolonic inflammatory changes Cycle 16 FOLFIRI/Avastin 12/09/2020 Cycle 17 FOLFIRI/Avastin 12/30/2020 Cycle 18 FOLFIRI/Avastin 01/20/2021 Cycle 19 FOLFIRI/Avastin 02/10/2021 Cycle 20 FOLFIRI/Avastin 03/03/2021 Cycle 21 FOLFIRI/Avastin 03/24/2021 Cycle 22 FOLFIRI/Avastin 04/14/2021 CTs 05/04/2021-slight improvement in hepatic metastatic disease. Cycle 23 FOLFIRI/Avastin 05/05/2021 Cycle 24 FOLFIRI/Avastin 05/25/2021 Cycle 25 FOLFIRI/Avastin 06/15/2021 Cycle 26 FOLFIRI/Avastin 07/13/2021 Cycle 27 FOLFIRI/Avastin 08/02/2021 CTs 08/30/2021-majority of liver lesions are unchanged in size, 1 lesion adjacent to the  gallbladder has increased, no adenopathy, persistent pericolonic stranding and peritoneal thickening at the cecum Progressive rise in CEA 08/31/2021-treatment changed to Xeloda/bevacizumab, began [place] 09/22/2021 CEA stable 11/03/2021 Xeloda/bevacizumab continued CTs 01/10/2022-increase in size of some of the liver lesions, others are stable, new tiny right lower lobe nodule, increased dilation of distal small bowel with increased wall  thickening at the terminal ileum and ileocecal valve with 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, stable bilateral paracolic gutter peritoneal thickening Panitumumab/ Encorafenib 01/12/2022 (encorafenib 01/19/2022) Panitumumab 02/02/2022 Panitumumab 02/20/2022 Panitumumab 03/06/2022 Panitumumab 03/20/2022 Panitumumab 04/03/2022 CTs 04/14/2022-decrease size of liver metastases, new borderline subcarinal adenopathy, improved peritoneal thickening, cirrhosis or pseudocirrhosis, persistent dilated cecum Encorafenib and Panitumumab continued Panitumumab 04/18/2022 Panitumumab 05/05/2022 Panitumumab 05/19/2022 Panitumumab 06/02/2022 Panitumumab 06/16/2022, 4g IV mag and begin oral mag po once daily  Panitumumab 06/30/2022 Panitumumab 07/14/2022, 2 g IV magnesium, increase oral magnesium to 400 mg twice daily Panitumumab 07/28/2022, 2 g IV magnesium CTs 07/28/2022-no change in hepatic metastases, no new signs of metastatic disease, persistent mural thickening at the terminal ileum/cecum mild splenomegaly unchanged Panitumumab 08/18/2022 Panitumumab 09/01/2022 Panitumumab 09/15/2022 Panitumumab 09/29/2022 Panitumumab 10/13/2022 Panitumumab 10/27/2022 CTs 11/03/2022-unchanged hepatic metastases, no new lesions, 3.7 cm soft tissue density at the medial left kidney etiology unclear, improvement in wall thickening at the ileocecal junction development of appendiceal dilatation Panitumumab 11/10/2022 Panitumumab 11/24/2022 Panitumumab 12/08/2022 Panitumumab 12/22/2022 Panitumumab  01/05/2023 Panitumumab 02/02/2023 Panitumumab 02/16/2023 CTs 03/01/2023-unchanged cecal mass. Enlargement of left liver lesion, other liver lesions unchanged. Near complete resolution of soft tissue left renal pelvis. No pericardial effusion. Panitumumab 03/02/2023 Panitumumab 03/16/2023 Panitumumab 03/30/2023 Panitumumab 04/13/2023 Panitumumab 04/27/2023 Panitumumab 05/11/2023 CTs 05/19/2023-no evidence of metastatic disease in the chest, progressive porta hepatis adenopathy and enlargement of a coalescent left liver lesion, possible mildly enlarging left liver lesion, right colon mass less obvious-slightly improved? Panitumumab 05/25/2023, Encorafenib continued Panitumumab 06/08/2023 Panitumumab 06/26/2023 Panitumumab 07/06/2023 Panitumumab 07/20/2023 CTs 07/22/2023-new and enlarged liver lesions.  Continued enlargement of metastatic porta hepatis and portacaval lymph nodes.  Newly enlarged retroperitoneal lymph nodes. Change in treatment to Lonsurf/Avastin-anticipate start date Lonsurf 08/13/2023, Avastin every 2 weeks beginning 08/17/2023 Anemia secondary to GI blood loss, and chemotherapy, on oral iron BID  Red cell transfusion 02/02/2023 Cirrhosis 12/26/2019-hospital admission for right lower extremity DVT and GI bleed, treated with heparin anticoagulation beginning 12/26/2019, converted to Lovenox at discharge 12/29/2019 Lovenox reduced to 40 mg daily 02/10/2021 secondary to bruising and nosebleeding History of low-grade fever-likely "tumor" fever COVID-19 infection January 2021 Admission 01/19/2023 with dyspnea/orthopnea, pericardial effusion, EKG changes suggestive of pericarditis Echocardiogram 01/20/2023-LVEF 60-65%, small to moderate circumferential pericardial effusion without tamponade physiology Echocardiogram 02/19/2022-LVEF 70-75%; trivial pericardial effusion. Anemia secondary to chemotherapy, chronic disease, and phlebotomy 2 units of packed red blood cells 02/03/2023    Disposition: Ms. Szymanski  has metastatic colon cancer.  The CEA is higher and she is having more pain.  There is evidence of disease progression on recent CTs.  Results/images reviewed with her at today's visit.  She understands and agrees with the recommendation to discontinue encorafenib and panitumumab.  Other treatment options reviewed including Lonsurf/Avastin.  We reviewed potential side effects associated with Lonsurf including hematologic toxicity, diarrhea, nausea/vomiting.  She has had Avastin in the past.  She was provided with printed information on both.  She agrees to proceed.  We also discussed a referral to [facility] for consideration of a clinical trial.  She would like to proceed with this.  Referral placed at today's visit.  Anticipate start date for [facility] 08/13/2023.  She will return for Avastin 08/17/2023.  We will see her in follow-up prior to proceeding with treatment that day.  Patient seen with Dr. Truett Perna.  Lonna Cobb ANP/GNP-BC   08/03/2023  10:11 AM  This was a shared visit with Lonna Cobb.  Ms. Wilhelmi was interviewed and examined.  We reviewed the restaging CT  findings and images with his [place].  His clinical and radiologic evidence of disease progression.  The CEA is higher.  Irinotecan/encorafenib will be discontinued.  We discussed treatment options.  The plan is to begin standard salvage treatment with Lonsurf/Avastin.  We will refer her to [facility] to consider clinical trial eligibility.  We reviewed potential toxicities associated with Lonsurf including the chance of hematologic toxicity and nausea.  A treatment plan was entered today.  I was present for greater than 50% of today's visit.  I performed medical decision making.  Mancel Bale, MD

## 2023-08-06 ENCOUNTER — Other Ambulatory Visit: Payer: BC Managed Care – PPO

## 2023-08-06 ENCOUNTER — Ambulatory Visit: Payer: BC Managed Care – PPO

## 2023-08-06 ENCOUNTER — Ambulatory Visit: Payer: BC Managed Care – PPO | Admitting: Oncology

## 2023-08-11 ENCOUNTER — Other Ambulatory Visit: Payer: Self-pay | Admitting: Oncology

## 2023-08-13 ENCOUNTER — Other Ambulatory Visit (HOSPITAL_COMMUNITY): Payer: Self-pay

## 2023-08-13 NOTE — Telephone Encounter (Signed)
Per [facility] online portal, medication scheduled to be delivered on 08/14/23.

## 2023-08-13 NOTE — Telephone Encounter (Signed)
Attempted several calls to patient and her daughter let them know rx was send out to [facility] and to provided medication education. Have not heard back from patient or daughter.   Per [facility] online portal medication is expected to be delivered tomorrow 08/14/23.

## 2023-08-17 ENCOUNTER — Inpatient Hospital Stay: Payer: BC Managed Care – PPO

## 2023-08-17 ENCOUNTER — Inpatient Hospital Stay: Payer: BC Managed Care – PPO | Attending: Oncology

## 2023-08-17 ENCOUNTER — Inpatient Hospital Stay: Payer: BC Managed Care – PPO | Admitting: Oncology

## 2023-08-17 VITALS — BP 127/68 | HR 99 | Temp 98.1°F | Resp 18 | Ht 67.0 in | Wt 131.0 lb

## 2023-08-17 VITALS — BP 131/63 | HR 86 | Temp 98.2°F | Resp 18

## 2023-08-17 DIAGNOSIS — R590 Localized enlarged lymph nodes: Secondary | ICD-10-CM | POA: Diagnosis not present

## 2023-08-17 DIAGNOSIS — D500 Iron deficiency anemia secondary to blood loss (chronic): Secondary | ICD-10-CM | POA: Diagnosis not present

## 2023-08-17 DIAGNOSIS — D6481 Anemia due to antineoplastic chemotherapy: Secondary | ICD-10-CM | POA: Diagnosis not present

## 2023-08-17 DIAGNOSIS — Z86718 Personal history of other venous thrombosis and embolism: Secondary | ICD-10-CM | POA: Diagnosis not present

## 2023-08-17 DIAGNOSIS — C182 Malignant neoplasm of ascending colon: Secondary | ICD-10-CM

## 2023-08-17 DIAGNOSIS — K7460 Unspecified cirrhosis of liver: Secondary | ICD-10-CM | POA: Diagnosis not present

## 2023-08-17 DIAGNOSIS — C787 Secondary malignant neoplasm of liver and intrahepatic bile duct: Secondary | ICD-10-CM | POA: Diagnosis not present

## 2023-08-17 DIAGNOSIS — Z452 Encounter for adjustment and management of vascular access device: Secondary | ICD-10-CM | POA: Diagnosis not present

## 2023-08-17 DIAGNOSIS — Z5111 Encounter for antineoplastic chemotherapy: Secondary | ICD-10-CM | POA: Diagnosis present

## 2023-08-17 DIAGNOSIS — Z95828 Presence of other vascular implants and grafts: Secondary | ICD-10-CM

## 2023-08-17 LAB — CMP (CANCER CENTER ONLY)
ALT: 10 U/L (ref 0–44)
AST: 22 U/L (ref 15–41)
Albumin: 3.7 g/dL (ref 3.5–5.0)
Alkaline Phosphatase: 214 U/L — ABNORMAL HIGH (ref 38–126)
Anion gap: 14 (ref 5–15)
BUN: 15 mg/dL (ref 8–23)
CO2: 23 mmol/L (ref 22–32)
Calcium: 9.1 mg/dL (ref 8.9–10.3)
Chloride: 100 mmol/L (ref 98–111)
Creatinine: 0.76 mg/dL (ref 0.44–1.00)
GFR, Estimated: >60 mL/min (ref >60)
Glucose, Bld: 118 mg/dL — ABNORMAL HIGH (ref 70–99)
Potassium: 4.1 mmol/L (ref 3.5–5.1)
Sodium: 137 mmol/L (ref 135–145)
Total Bilirubin: 0.7 mg/dL (ref 0.0–1.2)
Total Protein: 7.2 g/dL (ref 6.5–8.1)

## 2023-08-17 LAB — CBC WITH DIFFERENTIAL (CANCER CENTER ONLY)
Abs Immature Granulocytes: 0.03 10*3/uL (ref 0.00–0.07)
Basophils Absolute: 0 10*3/uL (ref 0.0–0.1)
Basophils Relative: 0 %
Eosinophils Absolute: 0.3 10*3/uL (ref 0.0–0.5)
Eosinophils Relative: 3 %
HCT: 28.2 % — ABNORMAL LOW (ref 36.0–46.0)
Hemoglobin: 8.9 g/dL — ABNORMAL LOW (ref 12.0–15.0)
Immature Granulocytes: 0 %
Lymphocytes Relative: 7 %
Lymphs Abs: 0.8 10*3/uL (ref 0.7–4.0)
MCH: 24.9 pg — ABNORMAL LOW (ref 26.0–34.0)
MCHC: 31.6 g/dL (ref 30.0–36.0)
MCV: 79 fL — ABNORMAL LOW (ref 80.0–100.0)
Monocytes Absolute: 1 10*3/uL (ref 0.1–1.0)
Monocytes Relative: 8 %
Neutro Abs: 9.6 10*3/uL — ABNORMAL HIGH (ref 1.7–7.7)
Neutrophils Relative %: 82 %
Platelet Count: 297 10*3/uL (ref 150–400)
RBC: 3.57 MIL/uL — ABNORMAL LOW (ref 3.87–5.11)
RDW: 15.1 % (ref 11.5–15.5)
WBC Count: 11.7 10*3/uL — ABNORMAL HIGH (ref 4.0–10.5)
nRBC: 0 % (ref 0.0–0.2)

## 2023-08-17 LAB — TOTAL PROTEIN, URINE DIPSTICK: Protein, ur: 100 mg/dL — AB

## 2023-08-17 LAB — CEA (ACCESS): CEA (CHCC): 747.86 ng/mL — ABNORMAL HIGH (ref 0.00–5.00)

## 2023-08-17 MED ORDER — HEPARIN SOD (PORK) LOCK FLUSH 100 UNIT/ML IV SOLN
500.0000 [IU] | Freq: Once | INTRAVENOUS | Status: AC
Start: 2023-08-17 — End: 2023-08-17
  Administered 2023-08-17: 500 [IU] via INTRAVENOUS

## 2023-08-17 MED ORDER — SODIUM CHLORIDE 0.9 % IV SOLN: INTRAVENOUS | Status: DC

## 2023-08-17 MED ORDER — MORPHINE SULFATE (PF) 2 MG/ML IV SOLN
4.0000 mg | Freq: Once | INTRAVENOUS | Status: AC
  Administered 2023-08-17: 4 mg via INTRAVENOUS
  Filled 2023-08-17: qty 2

## 2023-08-17 MED ORDER — OXYCODONE HCL 5 MG PO TABS: 5.0000 mg | ORAL_TABLET | ORAL | 0 refills | Status: DC | PRN

## 2023-08-17 MED ORDER — HYDROMORPHONE HCL 1 MG/ML IJ SOLN: 1.0000 mg | Freq: Once | INTRAMUSCULAR | Status: DC

## 2023-08-17 MED ORDER — SODIUM CHLORIDE 0.9% FLUSH
10.0000 mL | Freq: Once | INTRAVENOUS | Status: AC
Start: 2023-08-17 — End: 2023-08-17
  Administered 2023-08-17: 10 mL via INTRAVENOUS

## 2023-08-17 NOTE — Progress Notes (Signed)
 [facility] OFFICE PROGRESS NOTE   Diagnosis: Colon cancer  INTERVAL HISTORY:   Stacy Burns returns as scheduled.  She began Lonsurf  on 08/14/2023.  She reports the onset of increased abdominal pain 08/14/2023.  Oxycodone  is no longer relieving the pain for 6 hours.  She had 1 episode of nausea and vomiting.  She last had a bowel movement 08/13/2023.  She is passing flatus. The pain is in the right abdomen.  No difficulty with urination. She is scheduled begin bevacizumab  today. Objective:  Vital signs in last 24 hours:  Blood pressure 127/68, pulse 99, temperature 98.1 F (36.7 C), temperature source Temporal, resp. rate 18, height 5' 7 (1.702 m), weight 131 lb (59.4 kg), SpO2 100%.    HEENT: No buccal thrush, erythema/ulceration at the distal tongue Resp: Decreased breath sounds at the lower chest, no respiratory distress Cardio: Regular rate and rhythm GI: Liver is palpable throughout the right upper abdomen with associated tenderness, no splenomegaly, the abdomen is soft, bowel sounds are present Vascular: No leg edema   Portacath/PICC-without erythema  Lab Results:  Lab Results  Component Value Date   WBC 11.7 (H) 08/17/2023   HGB 8.9 (L) 08/17/2023   HCT 28.2 (L) 08/17/2023   MCV 79.0 (L) 08/17/2023   PLT 297 08/17/2023   NEUTROABS 9.6 (H) 08/17/2023    CMP  Lab Results  Component Value Date   NA 139 08/03/2023   K 4.1 08/03/2023   CL 107 08/03/2023   CO2 22 08/03/2023   GLUCOSE 135 (H) 08/03/2023   BUN 18 08/03/2023   CREATININE 0.82 08/03/2023   CALCIUM  8.8 (L) 08/03/2023   PROT 7.0 08/03/2023   ALBUMIN 3.7 08/03/2023   AST 16 08/03/2023   ALT 7 08/03/2023   ALKPHOS 135 (H) 08/03/2023   BILITOT 0.5 08/03/2023   GFRNONAA >60 08/03/2023   GFRAA >60 05/06/2020    Lab Results  Component Value Date   CEA1 54.57 (H) 12/30/2020   CEA 384.57 (H) 08/03/2023    Medications: I have reviewed the patient's current  medications.   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  Cirrhosis.  Innumerable liver lesions. Colonoscopy 12/18/2019-ulcerated partially obstructing large mass in the mid ascending colon.  Biopsy invasive adenocarcinoma; preserved expression major MMR proteins Upper endoscopy 12/18/2019-normal esophagus, stomach, examined duodenum CEA 12/19/2019-8,923 Biopsy liver lesion 12/23/2019-adenocarcinoma Foundation 1-K-ras wild-type; tumor mutation burden greater than or equal to 10; microsatellite stable; BRAF V600E Cycle 1 FOLFOX 01/01/2020 Cycle 2 FOLFOX 01/15/2020  Cycle 3 FOLFOX 01/29/2020, Udenyca  added Cycle 4 FOLFOX 02/12/2020, Udenyca  held Cycle 5 FOLFOX 02/26/2020, Udenyca  Cycle 6 FOLFOX 03/11/2020, Udenyca  held CT abdomen/pelvis 03/16/2020-stable diffuse liver lesions, stable strictured appearing ascending colon, possible pericolonic implant, vague left lower lobe nodule Cycle 1 FOLFIRI/Avastin  04/06/2020 Cycle 2 FOLFIRI/Avastin  04/21/2020 Cycle 3 FOLFIRI/Avastin  05/06/2020 Cycle 4 FOLFIRI/Avastin  05/20/2020 Cycle 5 FOLFIRI/Avastin  06/03/2020 CTs 06/15/2020-mild to moderate improvement in hepatic metastasis.  No new or progressive disease.  Narrowing within the proximal ascending colon with upstream mild cecal and terminal ileum dilatation, similar. Cycle 6 FOLFIRI/Avastin  06/17/2020 Cycle 7 FOLFIRI/Avastin  07/01/2020 Cycle 8 FOLFIRI/Avastin  07/15/2020 Cycle 9 FOLFIRI/Avastin  07/29/2020-5-FU bolus eliminated, 5-FU infusion and irinotecan  dose reduced Cycle 10 FOLFIRI/Avastin  08/12/2020 CTs 08/23/2020- mild residual wall thickening involving the cecum/ascending colon.  Numerous liver metastases improved. Cycle 11 FOLFIRI/Avastin  08/25/2020 Cycle 12 FOLFIRI/Avastin  09/16/2020 Cycle 13 FOLFIRI/Avastin  10/07/2020 Cycle 14 FOLFIRI/Avastin  10/28/2020 Cycle 15 FOLFIRI/Avastin  11/18/2020 CT  abdomen/pelvis 12/06/2020-mild improvement in multifocal hepatic metastases, wall thickening at the ascending colon with pericolonic inflammatory changes Cycle 16 FOLFIRI/Avastin  12/09/2020 Cycle 17 FOLFIRI/Avastin  12/30/2020 Cycle 18 FOLFIRI/Avastin  01/20/2021 Cycle 19 FOLFIRI/Avastin  02/10/2021 Cycle 20 FOLFIRI/Avastin  03/03/2021 Cycle 21 FOLFIRI/Avastin  03/24/2021 Cycle 22 FOLFIRI/Avastin  04/14/2021 CTs 05/04/2021-slight improvement in hepatic metastatic disease. Cycle 23 FOLFIRI/Avastin  05/05/2021 Cycle 24 FOLFIRI/Avastin  05/25/2021 Cycle 25 FOLFIRI/Avastin  06/15/2021 Cycle 26 FOLFIRI/Avastin  07/13/2021 Cycle 27 FOLFIRI/Avastin  08/02/2021 CTs 08/30/2021-majority of liver lesions are unchanged in size, 1 lesion adjacent to the gallbladder has increased, no adenopathy, persistent pericolonic stranding and peritoneal thickening at the cecum Progressive rise in CEA 08/31/2021-treatment changed to Xeloda /bevacizumab , began Xeoloda 09/22/2021 CEA stable 11/03/2021 Xeloda /bevacizumab  continued CTs 01/10/2022-increase in size of some of the liver lesions, others are stable, new tiny right lower lobe nodule, increased dilation of distal small bowel with increased wall thickening at the terminal ileum and ileocecal valve with 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, stable bilateral paracolic gutter peritoneal thickening Panitumumab / Encorafenib  01/12/2022 (encorafenib  01/19/2022) Panitumumab  02/02/2022 Panitumumab  02/20/2022 Panitumumab  03/06/2022 Panitumumab  03/20/2022 Panitumumab  04/03/2022 CTs 04/14/2022-decrease size of liver metastases, new borderline subcarinal adenopathy, improved peritoneal thickening, cirrhosis or pseudocirrhosis, persistent dilated cecum Encorafenib  and Panitumumab  continued Panitumumab  04/18/2022 Panitumumab  05/05/2022 Panitumumab  05/19/2022 Panitumumab  06/02/2022 Panitumumab  06/16/2022, 4g IV mag and begin oral mag po once daily  Panitumumab  06/30/2022 Panitumumab  07/14/2022, 2 g IV  magnesium , increase oral magnesium  to 400 mg twice daily Panitumumab  07/28/2022, 2 g IV magnesium  CTs 07/28/2022-no change in hepatic metastases, no new signs of metastatic disease, persistent mural thickening at the terminal ileum/cecum mild splenomegaly unchanged Panitumumab  08/18/2022 Panitumumab  09/01/2022 Panitumumab  09/15/2022 Panitumumab  09/29/2022 Panitumumab  10/13/2022 Panitumumab  10/27/2022 CTs 11/03/2022-unchanged hepatic metastases, no new lesions, 3.7 cm soft tissue density at the medial left kidney etiology unclear, improvement in wall thickening at the ileocecal junction development of appendiceal dilatation Panitumumab  11/10/2022 Panitumumab  11/24/2022 Panitumumab  12/08/2022 Panitumumab  12/22/2022 Panitumumab  01/05/2023 Panitumumab  02/02/2023 Panitumumab  02/16/2023 CTs 03/01/2023-unchanged cecal mass. Enlargement of left liver lesion, other liver lesions unchanged. Near complete resolution of soft tissue left renal pelvis. No pericardial effusion. Panitumumab  03/02/2023 Panitumumab  03/16/2023 Panitumumab  03/30/2023 Panitumumab  04/13/2023 Panitumumab  04/27/2023 Panitumumab  05/11/2023 CTs 05/19/2023-no evidence of metastatic disease in the chest, progressive porta hepatis adenopathy and enlargement of a coalescent left liver lesion, possible mildly enlarging left liver lesion, right colon mass less obvious-slightly improved? Panitumumab  05/25/2023, Encorafenib  continued Panitumumab  06/08/2023 Panitumumab  06/26/2023 Panitumumab  07/06/2023 Panitumumab  07/20/2023 CTs 07/22/2023-new and enlarged liver lesions.  Continued enlargement of metastatic porta hepatis and portacaval lymph nodes.  Newly enlarged retroperitoneal lymph nodes. Change in treatment to Lonsurf /Avastin -anticipate start date Lonsurf  08/14/2023, discontinued 08/15/2022 anemia secondary to GI blood loss, and chemotherapy, on oral iron BID  Red cell transfusion 02/02/2023 Cirrhosis 12/26/2019-hospital admission for right lower extremity DVT  and GI bleed, treated with heparin  anticoagulation beginning 12/26/2019, converted to Lovenox  at discharge 12/29/2019 Lovenox  reduced to 40 mg daily 02/10/2021 secondary to bruising and nosebleeding History of low-grade fever-likely tumor fever COVID-19 infection January 2021 Admission 01/19/2023 with dyspnea/orthopnea, pericardial effusion, EKG changes suggestive of pericarditis Echocardiogram 01/20/2023-LVEF 60-65%, small to moderate circumferential pericardial effusion without tamponade physiology Echocardiogram 02/19/2022-LVEF 70-75%; trivial pericardial effusion. Anemia secondary to chemotherapy, chronic disease, and phlebotomy 2 units of packed red blood cells 02/03/2023     Disposition: Stacy Burns has metastatic colon cancer.  She presents today with severe abdominal pain.  I suspect the pain is related to the metastatic tumor burden in the liver.  I have a low clinical suspicion for a bowel obstruction.  She appears very  uncomfortable in the clinic today.  She will hold londurf and bevacizumab  not be given today.  She will be given narcotic analgesics in the clinic today.  We will administer IV fluids and observe her for improvement in the pain.  We will contact her family to provide transportation home.  I will adjust the home narcotic regimen.  We will add a long-acting narcotic if she continues to have pain requiring frequent oxycodone .  She will be scheduled for a follow-up visit next week.  Arley Hof, MD  08/17/2023  10:12 AM

## 2023-08-17 NOTE — Patient Instructions (Addendum)
 Per Dr. Cloretta:  *STOP Lonsurf  medication and continue to hold until after your follow-up with Dr. Arron on 08/21/23.  *A prescription of oxycodone  has been sent in to your pharmacy.  Take 1-2 tablets every 4 hours for severe pain (pain score of 7-10). *Use Miralax once to twice daily.   Rehydration, Older Adult  Rehydration is the replacement of fluids, salts, and minerals in the body (electrolytes) that are lost during dehydration. Dehydration is when there is not enough water or other fluids in the body. This happens when you lose more fluids than you take in. People who are age 63 or older have a higher risk of dehydration than younger adults. This is because in older age, the body: Is less able to maintain the right amount of water. Does not respond to temperature changes as well. Does not get a sense of thirst as easily or quickly. Other causes include: Not drinking enough fluids. This can occur when you are ill, when you forget to drink, or when you are doing activities that require a lot of energy, especially in hot weather. Conditions that cause loss of water or other fluids. These include diarrhea, vomiting, sweating, or urinating a lot. Other illnesses, such as fever or infection. Certain medicines, such as those that remove excess fluid from the body (diuretics). Symptoms of mild or moderate dehydration may include thirst, dry lips and mouth, and dizziness. Symptoms of severe dehydration may include increased heart rate, confusion, fainting, and not urinating. In severe cases, you may need to get fluids through an IV at the hospital. For mild or moderate cases, you can usually rehydrate at home by drinking certain fluids as told by your health care provider. What are the risks? Rehydration is usually safe. Taking in too much fluid (overhydration) can be a problem but is rare. Overhydration can cause an imbalance of electrolytes in the body, kidney failure, fluid in the lungs,  or a decrease in salt (sodium) levels in the body. Supplies needed: You will need an oral rehydration solution (ORS) if your health care provider tells you to use one. This is a drink to treat dehydration. It can be found in pharmacies and retail stores. How to rehydrate Fluids Follow instructions from your health care provider about what to drink. The kind of fluid and the amount you should drink depend on your condition. In general, you should choose drinks that you prefer. If told by your health care provider, drink an ORS. Make an ORS by following instructions on the package. Start by drinking small amounts, about  cup (120 mL) every 5-10 minutes. Slowly increase how much you drink until you have taken in the amount recommended by your health care provider. Drink enough clear fluids to keep your urine pale yellow. If you were told to drink an ORS, finish it first, then start slowly drinking other clear fluids. Drink fluids such as: Water. This includes sparkling and flavored water. Drinking only water can lead to having too little sodium in your body (hyponatremia). Follow the advice of your health care provider. Water from ice chips you suck on. Fruit juice with water added to it(diluted). Sports drinks. Hot or cold herbal teas. Broth-based soups. Coffee. Milk or milk products. Food Follow instructions from your health care provider about what to eat while you rehydrate. Your health care provider may recommend that you slowly begin eating regular foods in small amounts. Eat foods that contain a healthy balance of electrolytes, such as bananas, oranges, potatoes,  tomatoes, and spinach. Avoid foods that are greasy or contain a lot of sugar. In some cases, you may get nutrition through a feeding tube that is passed through your nose and into your stomach (nasogastric tube, or NG tube). This may be done if you have uncontrolled vomiting or diarrhea. Drinks to avoid  Certain drinks may  make dehydration worse. While you rehydrate, avoid drinking alcohol . How to tell if you are recovering from dehydration You may be getting better if: You are urinating more often than before you started rehydrating. Your urine is pale yellow. Your energy level improves. You vomit less often. You have diarrhea less often. Your appetite improves or returns to normal. You feel less dizzy or light-headed. Your skin tone and color start to look more normal. Follow these instructions at home: Take over-the-counter and prescription medicines only as told by your health care provider. Do not take sodium tablets. Doing this can lead to having too much sodium in your body (hypernatremia). Contact a health care provider if: You continue to have symptoms of mild or moderate dehydration, such as: Thirst. Dry lips. Slightly dry mouth. Dizziness. Dark urine or less urine than usual. Muscle cramps. You continue to vomit or have diarrhea. Get help right away if: You have symptoms of dehydration that get worse. You have a fever. You have a severe headache. You have been vomiting and have problems, such as: Your vomiting gets worse. Your vomit includes blood or green matter (bile). You cannot eat or drink without vomiting. You have problems with urination or bowel movements, such as: Diarrhea that gets worse. Blood in your stool (feces). This may cause stool to look black and tarry. Not urinating, or urinating only a small amount of very dark urine, within 6-8 hours. You have trouble breathing. You have symptoms that get worse with treatment. These symptoms may be an emergency. Get help right away. Call 911. Do not wait to see if the symptoms will go away. Do not drive yourself to the hospital. This information is not intended to replace advice given to you by your health care provider. Make sure you discuss any questions you have with your health care provider. Document Revised: 12/14/2021  Document Reviewed: 12/12/2021 Elsevier Patient Education  2024 [employer].

## 2023-08-17 NOTE — Patient Instructions (Signed)

## 2023-08-17 NOTE — Progress Notes (Signed)
 During administration of second dose of morphine , patient's speech became inconsistent. Patient was able to say her name, but was unable to say her DOB.  Patient then was unable to basic questions appropriately.  Dose administration was  administration stopped due to patient change in status. Approximately 1mg  morphine  given before administration was stopped. Geofm Salt, RN witnessed waste of remaining medication. VS were checked and were stable.  Patient was able to follow commands and move arms, legs, fingers, toes appropriately.  Olam Ned, NP was made aware and came to infusion room to evaluate patient.  Per Olam Ned, NP, patient to  remain in infusion room for continued observation until patient becomes more oriented to place, person, and time.  Patient's son Yareli Carthen was called and was updated on patient's change in status. Patient's son stated that he would be driving patient home and would be with the patient throughout the night.   Patient's son arrived to infusion room around 1545.  Patient able to answer orientation questions appropriately. VSS. Dr. Cloretta and Olam Ned, NP notified and Dr. Cloretta came to infusion room to reevaluate patient.  Per Dr. Cloretta, ok for patient to be discharged home.  Patient agreed and stated she felt comfortable being discharged home.

## 2023-08-21 ENCOUNTER — Inpatient Hospital Stay (HOSPITAL_BASED_OUTPATIENT_CLINIC_OR_DEPARTMENT_OTHER): Payer: BC Managed Care – PPO | Admitting: Nurse Practitioner

## 2023-08-21 ENCOUNTER — Encounter: Payer: Self-pay | Admitting: Nurse Practitioner

## 2023-08-21 VITALS — BP 124/60 | HR 99 | Temp 98.1°F | Resp 18 | Ht 67.0 in | Wt 132.9 lb

## 2023-08-21 DIAGNOSIS — Z5111 Encounter for antineoplastic chemotherapy: Secondary | ICD-10-CM | POA: Diagnosis not present

## 2023-08-21 DIAGNOSIS — C182 Malignant neoplasm of ascending colon: Secondary | ICD-10-CM | POA: Diagnosis not present

## 2023-08-21 MED ORDER — OXYCODONE HCL ER 10 MG PO T12A: 10.0000 mg | EXTENDED_RELEASE_TABLET | Freq: Two times a day (BID) | ORAL | 0 refills | Status: DC

## 2023-08-21 NOTE — Progress Notes (Signed)
 Stacy Burns   Diagnosis: Colon cancer  INTERVAL HISTORY:   Stacy Burns returns as scheduled.  She began Lonsurf  08/14/2023.  She developed increased abdominal pain the same day.  She was seen in the office 08/17/2023, experiencing severe abdominal pain.  Lonsurf  placed on hold, bevacizumab  not given.  She received IV pain medicine with improvement.  She is seen today for follow-up.  She reports improved pain control with oxycodone  every 4 hours around-the-clock.  Pain is located in the abdomen and back.  She has not had a bowel movement since last week.  Appetite is poor.  Objective:  Vital signs in last 24 hours:  Blood pressure 124/60, pulse 99, temperature 98.1 F (36.7 C), temperature source Temporal, resp. rate 18, height 5' 7 (1.702 m), weight 132 lb 14.4 oz (60.3 kg), SpO2 100%.    HEENT: No thrush or ulcers. Resp: Lungs clear bilaterally. Cardio: Regular rate and rhythm. GI: Liver is palpable throughout the right upper abdomen, crossing midline into the left abdomen, associated tenderness.  No splenomegaly. Vascular: No leg edema. Port-A-Cath without erythema.  Lab Results:  Lab Results  Component Value Date   WBC 11.7 (H) 08/17/2023   HGB 8.9 (L) 08/17/2023   HCT 28.2 (L) 08/17/2023   MCV 79.0 (L) 08/17/2023   PLT 297 08/17/2023   NEUTROABS 9.6 (H) 08/17/2023    Imaging:  No results found.  Medications: I have reviewed the patient's current medications.  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  Cirrhosis.  Innumerable liver lesions. Colonoscopy 12/18/2019-ulcerated partially obstructing large mass in the mid ascending colon.  Biopsy invasive adenocarcinoma; preserved expression major MMR proteins Upper endoscopy 12/18/2019-normal esophagus, stomach, examined duodenum CEA 12/19/2019-8,923 Biopsy liver lesion  12/23/2019-adenocarcinoma Foundation 1-K-ras wild-type; tumor mutation burden greater than or equal to 10; microsatellite stable; BRAF V600E Cycle 1 FOLFOX 01/01/2020 Cycle 2 FOLFOX 01/15/2020  Cycle 3 FOLFOX 01/29/2020, Udenyca  added Cycle 4 FOLFOX 02/12/2020, Udenyca  held Cycle 5 FOLFOX 02/26/2020, Udenyca  Cycle 6 FOLFOX 03/11/2020, Udenyca  held CT abdomen/pelvis 03/16/2020-stable diffuse liver lesions, stable strictured appearing ascending colon, possible pericolonic implant, vague left lower lobe nodule Cycle 1 FOLFIRI/Avastin  04/06/2020 Cycle 2 FOLFIRI/Avastin  04/21/2020 Cycle 3 FOLFIRI/Avastin  05/06/2020 Cycle 4 FOLFIRI/Avastin  05/20/2020 Cycle 5 FOLFIRI/Avastin  06/03/2020 CTs 06/15/2020-mild to moderate improvement in hepatic metastasis.  No new or progressive disease.  Narrowing within the proximal ascending colon with upstream mild cecal and terminal ileum dilatation, similar. Cycle 6 FOLFIRI/Avastin  06/17/2020 Cycle 7 FOLFIRI/Avastin  07/01/2020 Cycle 8 FOLFIRI/Avastin  07/15/2020 Cycle 9 FOLFIRI/Avastin  07/29/2020-5-FU bolus eliminated, 5-FU infusion and irinotecan  dose reduced Cycle 10 FOLFIRI/Avastin  08/12/2020 CTs 08/23/2020- mild residual wall thickening involving the cecum/ascending colon.  Numerous liver metastases improved. Cycle 11 FOLFIRI/Avastin  08/25/2020 Cycle 12 FOLFIRI/Avastin  09/16/2020 Cycle 13 FOLFIRI/Avastin  10/07/2020 Cycle 14 FOLFIRI/Avastin  10/28/2020 Cycle 15 FOLFIRI/Avastin  11/18/2020 CT abdomen/pelvis 12/06/2020-mild improvement in multifocal hepatic metastases, wall thickening at the ascending colon with pericolonic inflammatory changes Cycle 16 FOLFIRI/Avastin  12/09/2020 Cycle 17 FOLFIRI/Avastin  12/30/2020 Cycle 18 FOLFIRI/Avastin  01/20/2021 Cycle 19 FOLFIRI/Avastin  02/10/2021 Cycle 20 FOLFIRI/Avastin  03/03/2021 Cycle 21 FOLFIRI/Avastin  03/24/2021 Cycle 22 FOLFIRI/Avastin  04/14/2021 CTs 05/04/2021-slight improvement in hepatic metastatic disease. Cycle 23 FOLFIRI/Avastin   05/05/2021 Cycle 24 FOLFIRI/Avastin  05/25/2021 Cycle 25 FOLFIRI/Avastin  06/15/2021 Cycle 26 FOLFIRI/Avastin  07/13/2021 Cycle 27 FOLFIRI/Avastin  08/02/2021 CTs 08/30/2021-majority of liver lesions are unchanged in size, 1 lesion adjacent to the gallbladder has increased, no adenopathy, persistent pericolonic stranding and peritoneal thickening at the cecum Progressive rise in  CEA 08/31/2021-treatment changed to Xeloda /bevacizumab , began Xeoloda 09/22/2021 CEA stable 11/03/2021 Xeloda /bevacizumab  continued CTs 01/10/2022-increase in size of some of the liver lesions, others are stable, new tiny right lower lobe nodule, increased dilation of distal small bowel with increased wall thickening at the terminal ileum and ileocecal valve with 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, stable bilateral paracolic gutter peritoneal thickening Panitumumab / Encorafenib  01/12/2022 (encorafenib  01/19/2022) Panitumumab  02/02/2022 Panitumumab  02/20/2022 Panitumumab  03/06/2022 Panitumumab  03/20/2022 Panitumumab  04/03/2022 CTs 04/14/2022-decrease size of liver metastases, new borderline subcarinal adenopathy, improved peritoneal thickening, cirrhosis or pseudocirrhosis, persistent dilated cecum Encorafenib  and Panitumumab  continued Panitumumab  04/18/2022 Panitumumab  05/05/2022 Panitumumab  05/19/2022 Panitumumab  06/02/2022 Panitumumab  06/16/2022, 4g IV mag and begin oral mag po once daily  Panitumumab  06/30/2022 Panitumumab  07/14/2022, 2 g IV magnesium , increase oral magnesium  to 400 mg twice daily Panitumumab  07/28/2022, 2 g IV magnesium  CTs 07/28/2022-no change in hepatic metastases, no new signs of metastatic disease, persistent mural thickening at the terminal ileum/cecum mild splenomegaly unchanged Panitumumab  08/18/2022 Panitumumab  09/01/2022 Panitumumab  09/15/2022 Panitumumab  09/29/2022 Panitumumab  10/13/2022 Panitumumab  10/27/2022 CTs 11/03/2022-unchanged hepatic metastases, no new lesions, 3.7 cm soft tissue density at  the medial left kidney etiology unclear, improvement in wall thickening at the ileocecal junction development of appendiceal dilatation Panitumumab  11/10/2022 Panitumumab  11/24/2022 Panitumumab  12/08/2022 Panitumumab  12/22/2022 Panitumumab  01/05/2023 Panitumumab  02/02/2023 Panitumumab  02/16/2023 CTs 03/01/2023-unchanged cecal mass. Enlargement of left liver lesion, other liver lesions unchanged. Near complete resolution of soft tissue left renal pelvis. No pericardial effusion. Panitumumab  03/02/2023 Panitumumab  03/16/2023 Panitumumab  03/30/2023 Panitumumab  04/13/2023 Panitumumab  04/27/2023 Panitumumab  05/11/2023 CTs 05/19/2023-no evidence of metastatic disease in the chest, progressive porta hepatis adenopathy and enlargement of a coalescent left liver lesion, possible mildly enlarging left liver lesion, right colon mass less obvious-slightly improved? Panitumumab  05/25/2023, Encorafenib  continued Panitumumab  06/08/2023 Panitumumab  06/26/2023 Panitumumab  07/06/2023 Panitumumab  07/20/2023 CTs 07/22/2023-new and enlarged liver lesions.  Continued enlargement of metastatic porta hepatis and portacaval lymph nodes.  Newly enlarged retroperitoneal lymph nodes. Change in treatment to Lonsurf /Avastin -anticipate start date Lonsurf  08/14/2023, discontinued 08/15/2022 Cirrhosis 12/26/2019-hospital admission for right lower extremity DVT and GI bleed, treated with heparin  anticoagulation beginning 12/26/2019, converted to Lovenox  at discharge 12/29/2019 Lovenox  reduced to 40 mg daily 02/10/2021 secondary to bruising and nosebleeding History of low-grade fever-likely tumor fever COVID-19 infection January 2021 Admission 01/19/2023 with dyspnea/orthopnea, pericardial effusion, EKG changes suggestive of pericarditis Echocardiogram 01/20/2023-LVEF 60-65%, small to moderate circumferential pericardial effusion without tamponade physiology Echocardiogram 02/19/2022-LVEF 70-75%; trivial pericardial effusion. Anemia secondary to  chemotherapy, chronic disease, and phlebotomy 2 units of packed red blood cells 02/03/2023      Disposition: Stacy Burns appears stable.  She understands the pain is most likely due to metastatic disease in the liver and retroperitoneal adenopathy.  She will continue oxycodone  as needed.  We discussed beginning OxyContin .  She is in agreement with this plan.  She understands she should not drive while taking pain medication.  She will begin a bowel regimen.  We are placing systemic therapy on hold.  She has an appointment with Dr. Brenna at [facility] next week, 08/27/2023.  She will return for follow-up 08/28/2023.  We are available to see her sooner if needed.  Patient seen with Dr. Cloretta.    Olam Ned ANP/GNP-BC   08/21/2023  9:46 AM  This was a shared visit with Olam Ned.  Stacy Burns continues to have right abdominal pain likely secondary to liver/retroperitoneal lymph node metastases.  We adjusted the narcotic pain regimen.  She would like to hold further systemic therapy until she is evaluated by Dr.  Strickler.  I was present for greater than 50% of today's visit.  I performed medical decision making.  Arvella Hof, MD

## 2023-08-23 ENCOUNTER — Telehealth: Payer: Self-pay

## 2023-08-23 ENCOUNTER — Other Ambulatory Visit: Payer: Self-pay

## 2023-08-23 ENCOUNTER — Other Ambulatory Visit: Payer: Self-pay | Admitting: Oncology

## 2023-08-23 DIAGNOSIS — C182 Malignant neoplasm of ascending colon: Secondary | ICD-10-CM

## 2023-08-23 NOTE — Telephone Encounter (Signed)
 Spoke with patient in regards to taking Mag- Oxide 400 mg BID. PT stated taking medication but missed a couple doses. Made PT aware Magnesium  level is still low- results from 08/03/23, to continue taking Mag-Oxide as prescribed. Understood, no further questions.

## 2023-08-24 ENCOUNTER — Other Ambulatory Visit: Payer: Self-pay | Admitting: Oncology

## 2023-08-24 DIAGNOSIS — C182 Malignant neoplasm of ascending colon: Secondary | ICD-10-CM

## 2023-08-24 NOTE — Telephone Encounter (Signed)
 Lonsurf on hold-will re-evaluate on 1/14

## 2023-08-27 ENCOUNTER — Telehealth: Payer: Self-pay

## 2023-08-27 NOTE — Telephone Encounter (Signed)
 Notified patient that her [employer] documents had been completed and faxed back to company. Fax confirmation received. Copy of documents mailed to patient per request. No further concerns at this time.

## 2023-08-28 ENCOUNTER — Encounter: Payer: Self-pay | Admitting: Nurse Practitioner

## 2023-08-28 ENCOUNTER — Inpatient Hospital Stay: Payer: BC Managed Care – PPO | Admitting: Nurse Practitioner

## 2023-08-28 VITALS — BP 115/58 | HR 98 | Temp 98.1°F | Resp 18 | Ht 67.0 in | Wt 131.0 lb

## 2023-08-28 DIAGNOSIS — C182 Malignant neoplasm of ascending colon: Secondary | ICD-10-CM | POA: Diagnosis not present

## 2023-08-28 DIAGNOSIS — Z5111 Encounter for antineoplastic chemotherapy: Secondary | ICD-10-CM | POA: Diagnosis not present

## 2023-08-28 MED ORDER — OXYCODONE HCL 5 MG PO TABS: 5.0000 mg | ORAL_TABLET | ORAL | 0 refills | Status: DC | PRN

## 2023-08-28 NOTE — Progress Notes (Signed)
 [facility] OFFICE PROGRESS NOTE   Diagnosis: Colon cancer  INTERVAL HISTORY:   [name] returns as scheduled.  Abdominal pain is controlled with oxycodone  as needed.  She has not started OxyContin .  Bowels are moving.  She reports numbness in the soles of both feet, states I have learned to deal with it.  No numbness in the hands.  Objective:  Vital signs in last 24 hours:  Blood pressure (!) 115/58, pulse 98, temperature 98.1 F (36.7 C), temperature source Temporal, resp. rate 18, height 5' 7 (1.702 m), weight 131 lb (59.4 kg), SpO2 100%.    HEENT: No thrush or ulcers. Resp: Lungs clear bilaterally. Cardio: Regular rate and rhythm. GI: Liver enlarged throughout the upper abdomen, crossing midline, associated tenderness. Vascular: No leg edema. Port-A-Cath without erythema.  Lab Results:  Lab Results  Component Value Date   WBC 11.7 (H) 08/17/2023   HGB 8.9 (L) 08/17/2023   HCT 28.2 (L) 08/17/2023   MCV 79.0 (L) 08/17/2023   PLT 297 08/17/2023   NEUTROABS 9.6 (H) 08/17/2023    Imaging:  No results found.  Medications: I have reviewed the patient's current medications.  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  Cirrhosis.  Innumerable liver lesions. Colonoscopy 12/18/2019-ulcerated partially obstructing large mass in the mid ascending colon.  Biopsy invasive adenocarcinoma; preserved expression major MMR proteins Upper endoscopy 12/18/2019-normal esophagus, stomach, examined duodenum CEA 12/19/2019-8,923 Biopsy liver lesion 12/23/2019-adenocarcinoma Foundation 1-K-ras wild-type; tumor mutation burden greater than or equal to 10; microsatellite stable; BRAF V600E Cycle 1 FOLFOX 01/01/2020 Cycle 2 FOLFOX 01/15/2020  Cycle 3 FOLFOX 01/29/2020, Udenyca  added Cycle 4 FOLFOX 02/12/2020, Udenyca  held Cycle 5 FOLFOX 02/26/2020, Udenyca  Cycle  6 FOLFOX 03/11/2020, Udenyca  held CT abdomen/pelvis 03/16/2020-stable diffuse liver lesions, stable strictured appearing ascending colon, possible pericolonic implant, vague left lower lobe nodule Cycle 1 FOLFIRI/Avastin  04/06/2020 Cycle 2 FOLFIRI/Avastin  04/21/2020 Cycle 3 FOLFIRI/Avastin  05/06/2020 Cycle 4 FOLFIRI/Avastin  05/20/2020 Cycle 5 FOLFIRI/Avastin  06/03/2020 CTs 06/15/2020-mild to moderate improvement in hepatic metastasis.  No new or progressive disease.  Narrowing within the proximal ascending colon with upstream mild cecal and terminal ileum dilatation, similar. Cycle 6 FOLFIRI/Avastin  06/17/2020 Cycle 7 FOLFIRI/Avastin  07/01/2020 Cycle 8 FOLFIRI/Avastin  07/15/2020 Cycle 9 FOLFIRI/Avastin  07/29/2020-5-FU bolus eliminated, 5-FU infusion and irinotecan  dose reduced Cycle 10 FOLFIRI/Avastin  08/12/2020 CTs 08/23/2020- mild residual wall thickening involving the cecum/ascending colon.  Numerous liver metastases improved. Cycle 11 FOLFIRI/Avastin  08/25/2020 Cycle 12 FOLFIRI/Avastin  09/16/2020 Cycle 13 FOLFIRI/Avastin  10/07/2020 Cycle 14 FOLFIRI/Avastin  10/28/2020 Cycle 15 FOLFIRI/Avastin  11/18/2020 CT abdomen/pelvis 12/06/2020-mild improvement in multifocal hepatic metastases, wall thickening at the ascending colon with pericolonic inflammatory changes Cycle 16 FOLFIRI/Avastin  12/09/2020 Cycle 17 FOLFIRI/Avastin  12/30/2020 Cycle 18 FOLFIRI/Avastin  01/20/2021 Cycle 19 FOLFIRI/Avastin  02/10/2021 Cycle 20 FOLFIRI/Avastin  03/03/2021 Cycle 21 FOLFIRI/Avastin  03/24/2021 Cycle 22 FOLFIRI/Avastin  04/14/2021 CTs 05/04/2021-slight improvement in hepatic metastatic disease. Cycle 23 FOLFIRI/Avastin  05/05/2021 Cycle 24 FOLFIRI/Avastin  05/25/2021 Cycle 25 FOLFIRI/Avastin  06/15/2021 Cycle 26 FOLFIRI/Avastin  07/13/2021 Cycle 27 FOLFIRI/Avastin  08/02/2021 CTs 08/30/2021-majority of liver lesions are unchanged in size, 1 lesion adjacent to the gallbladder has increased, no adenopathy, persistent pericolonic stranding and  peritoneal thickening at the cecum Progressive rise in CEA 08/31/2021-treatment changed to Xeloda /bevacizumab , began Xeoloda 09/22/2021 CEA stable 11/03/2021 Xeloda /bevacizumab  continued CTs 01/10/2022-increase in size of some of the liver lesions, others are stable, new tiny right lower lobe nodule, increased dilation of distal small bowel with increased wall thickening at the terminal ileum and ileocecal valve with chronic stricture  in the proximal ascending colon, stable bilateral paracolic gutter peritoneal thickening Panitumumab / Encorafenib  01/12/2022 (encorafenib  01/19/2022) Panitumumab  02/02/2022 Panitumumab  02/20/2022 Panitumumab  03/06/2022 Panitumumab  03/20/2022 Panitumumab  04/03/2022 CTs 04/14/2022-decrease size of liver metastases, new borderline subcarinal adenopathy, improved peritoneal thickening, cirrhosis or pseudocirrhosis, persistent dilated cecum Encorafenib  and Panitumumab  continued Panitumumab  04/18/2022 Panitumumab  05/05/2022 Panitumumab  05/19/2022 Panitumumab  06/02/2022 Panitumumab  06/16/2022, 4g IV mag and begin oral mag po once daily  Panitumumab  06/30/2022 Panitumumab  07/14/2022, 2 g IV magnesium , increase oral magnesium  to 400 mg twice daily Panitumumab  07/28/2022, 2 g IV magnesium  CTs 07/28/2022-no change in hepatic metastases, no new signs of metastatic disease, persistent mural thickening at the terminal ileum/cecum mild splenomegaly unchanged Panitumumab  08/18/2022 Panitumumab  09/01/2022 Panitumumab  09/15/2022 Panitumumab  09/29/2022 Panitumumab  10/13/2022 Panitumumab  10/27/2022 CTs 11/03/2022-unchanged hepatic metastases, no new lesions, 3.7 cm soft tissue density at the medial left kidney etiology unclear, improvement in wall thickening at the ileocecal junction development of appendiceal dilatation Panitumumab  11/10/2022 Panitumumab  11/24/2022 Panitumumab  12/08/2022 Panitumumab  12/22/2022 Panitumumab  01/05/2023 Panitumumab  02/02/2023 Panitumumab  02/16/2023 CTs 03/01/2023-unchanged cecal  mass. Enlargement of left liver lesion, other liver lesions unchanged. Near complete resolution of soft tissue left renal pelvis. No pericardial effusion. Panitumumab  03/02/2023 Panitumumab  03/16/2023 Panitumumab  03/30/2023 Panitumumab  04/13/2023 Panitumumab  04/27/2023 Panitumumab  05/11/2023 CTs 05/19/2023-no evidence of metastatic disease in the chest, progressive porta hepatis adenopathy and enlargement of a coalescent left liver lesion, possible mildly enlarging left liver lesion, right colon mass less obvious-slightly improved? Panitumumab  05/25/2023, Encorafenib  continued Panitumumab  06/08/2023 Panitumumab  06/26/2023 Panitumumab  07/06/2023 Panitumumab  07/20/2023 CTs 07/22/2023-new and enlarged liver lesions.  Continued enlargement of metastatic porta hepatis and portacaval lymph nodes.  Newly enlarged retroperitoneal lymph nodes. Change in treatment to Lonsurf /Avastin -anticipate start date Lonsurf  08/14/2023, discontinued 08/15/2022 Cycle 1 FOLFOX/bevacizumab  09/04/2023 Cirrhosis 12/26/2019-hospital admission for right lower extremity DVT and GI bleed, treated with heparin  anticoagulation beginning 12/26/2019, converted to Lovenox  at discharge 12/29/2019 Lovenox  reduced to 40 mg daily 02/10/2021 secondary to bruising and nosebleeding History of low-grade fever-likely tumor fever COVID-19 infection January 2021 Admission 01/19/2023 with dyspnea/orthopnea, pericardial effusion, EKG changes suggestive of pericarditis Echocardiogram 01/20/2023-LVEF 60-65%, small to moderate circumferential pericardial effusion without tamponade physiology Echocardiogram 02/19/2022-LVEF 70-75%; trivial pericardial effusion. Anemia secondary to chemotherapy, chronic disease, and phlebotomy 2 units of packed red blood cells 02/03/2023    Disposition: Ms. Guardado appears stable.  Systemic therapy currently on hold.  She has seen Dr. Brenna.  Recommendation is for recycled FOLFOX/bevacizumab .  She has been added to a clinical  trial wait list.  We reviewed potential side effects associated with FOLFOX/bevacizumab .  She understands the potential for neurotoxicity with oxaliplatin  including worsening of current symptoms.  We discussed the increased chance of an allergic reaction when retreated with oxaliplatin .  We reviewed potential side effects associated with 5-fluorouracil  including mouth sores, diarrhea, cardiotoxicity, hand-foot syndrome, skin rash, increased sensitivity to sun, skin hyperpigmentation.  We reviewed potential side effects associated with bevacizumab  including bleeding, delayed wound healing, proteinuria, hypertension, bowel perforation, allergic reaction, increased risk of blood clot/stroke.  She agrees to proceed.  For the abdominal pain she will continue oxycodone  as needed.  She will return for cycle 1 FOLFOX/bevacizumab  in 1 week.  We will see her prior to cycle 2 on 09/19/2023.  She will contact the office in the interim with any problems.  Patient seen with Dr. Cloretta.  Olam Ned ANP/GNP-BC   08/28/2023  11:21 AM  This was a shared visit with Olam Ned.  Ms. Facemire has experienced improvement in pain over the past 2 weeks.  She relates this to relief  of constipation.  She continues oxycodone  as needed.  We reviewed the consult note from Dr. Brenna.  He recommends repeat treatment with FOLFOX with the addition of bevacizumab .  We reviewed potential toxicities associated with this regimen including the chance of progressive/long-lasting neuropathy.  She agrees to proceed.  She did not have progressive disease when treated with FOLFOX in 2021.  A treatment plan was entered today.  I was present for greater than 50% of today's visit.  I performed medical decision making.  Arvella Hof, MD

## 2023-08-28 NOTE — Progress Notes (Signed)
 DISCONTINUE OFF PATHWAY REGIMEN - Colorectal   [id number]:Bevacizumab  5 mg/kg IV D1,15 + Trifluridine /Tipiracil  35 mg/m2 PO BID D1-5, 8-12 q28 Days:   A cycle is every 28 days:     Trifluridine  and tipiracil       Bevacizumab -xxxx   **Always confirm dose/schedule in your pharmacy ordering system**  PRIOR TREATMENT: Off Pathway: Bevacizumab  5 mg/kg IV D1,15 + Trifluridine /Tipiracil  35 mg/m2 PO BID D1-5, 8-12 q28 Days  START ON PATHWAY REGIMEN - Colorectal     A cycle is every 14 days:     Bevacizumab -xxxx      Oxaliplatin       Leucovorin       Fluorouracil       Fluorouracil    **Always confirm dose/schedule in your pharmacy ordering system**  Patient Characteristics: Distant Metastases, Nonsurgical Candidate, 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

## 2023-08-29 ENCOUNTER — Ambulatory Visit: Payer: BC Managed Care – PPO

## 2023-08-29 ENCOUNTER — Encounter: Payer: Self-pay | Admitting: Oncology

## 2023-08-29 ENCOUNTER — Ambulatory Visit: Payer: BC Managed Care – PPO | Admitting: Nurse Practitioner

## 2023-08-29 ENCOUNTER — Other Ambulatory Visit: Payer: Self-pay

## 2023-08-29 ENCOUNTER — Other Ambulatory Visit: Payer: BC Managed Care – PPO

## 2023-08-30 ENCOUNTER — Encounter: Payer: Self-pay | Admitting: Oncology

## 2023-08-30 ENCOUNTER — Other Ambulatory Visit: Payer: Self-pay

## 2023-08-31 ENCOUNTER — Other Ambulatory Visit: Payer: BC Managed Care – PPO

## 2023-08-31 ENCOUNTER — Ambulatory Visit: Payer: BC Managed Care – PPO | Admitting: Nurse Practitioner

## 2023-08-31 ENCOUNTER — Ambulatory Visit: Payer: BC Managed Care – PPO

## 2023-08-31 NOTE — Addendum Note (Signed)
Addended by: Wandalee Ferdinand on: 08/31/2023 04:54 PM   Modules accepted: Orders

## 2023-09-01 ENCOUNTER — Other Ambulatory Visit: Payer: Self-pay | Admitting: Oncology

## 2023-09-04 ENCOUNTER — Inpatient Hospital Stay: Payer: BC Managed Care – PPO

## 2023-09-04 ENCOUNTER — Other Ambulatory Visit: Payer: Self-pay

## 2023-09-04 ENCOUNTER — Other Ambulatory Visit: Payer: Self-pay | Admitting: Nurse Practitioner

## 2023-09-04 DIAGNOSIS — D649 Anemia, unspecified: Secondary | ICD-10-CM

## 2023-09-04 DIAGNOSIS — C182 Malignant neoplasm of ascending colon: Secondary | ICD-10-CM

## 2023-09-04 DIAGNOSIS — Z5111 Encounter for antineoplastic chemotherapy: Secondary | ICD-10-CM | POA: Diagnosis not present

## 2023-09-04 LAB — CBC WITH DIFFERENTIAL (CANCER CENTER ONLY)
Abs Immature Granulocytes: 0.03 10*3/uL (ref 0.00–0.07)
Basophils Absolute: 0 10*3/uL (ref 0.0–0.1)
Basophils Relative: 0 %
Eosinophils Absolute: 0.2 10*3/uL (ref 0.0–0.5)
Eosinophils Relative: 2 %
HCT: 23.3 % — ABNORMAL LOW (ref 36.0–46.0)
Hemoglobin: 7.1 g/dL — ABNORMAL LOW (ref 12.0–15.0)
Immature Granulocytes: 0 %
Lymphocytes Relative: 6 %
Lymphs Abs: 0.4 10*3/uL — ABNORMAL LOW (ref 0.7–4.0)
MCH: 24.1 pg — ABNORMAL LOW (ref 26.0–34.0)
MCHC: 30.5 g/dL (ref 30.0–36.0)
MCV: 79.3 fL — ABNORMAL LOW (ref 80.0–100.0)
Monocytes Absolute: 0.9 10*3/uL (ref 0.1–1.0)
Monocytes Relative: 12 %
Neutro Abs: 6.1 10*3/uL (ref 1.7–7.7)
Neutrophils Relative %: 80 %
Platelet Count: 268 10*3/uL (ref 150–400)
RBC: 2.94 MIL/uL — ABNORMAL LOW (ref 3.87–5.11)
RDW: 17.2 % — ABNORMAL HIGH (ref 11.5–15.5)
WBC Count: 7.6 10*3/uL (ref 4.0–10.5)
nRBC: 0 % (ref 0.0–0.2)

## 2023-09-04 LAB — CMP (CANCER CENTER ONLY)
ALT: 5 U/L (ref 0–44)
AST: 18 U/L (ref 15–41)
Albumin: 3 g/dL — ABNORMAL LOW (ref 3.5–5.0)
Alkaline Phosphatase: 174 U/L — ABNORMAL HIGH (ref 38–126)
Anion gap: 9 (ref 5–15)
BUN: 16 mg/dL (ref 8–23)
CO2: 26 mmol/L (ref 22–32)
Calcium: 8.4 mg/dL — ABNORMAL LOW (ref 8.9–10.3)
Chloride: 103 mmol/L (ref 98–111)
Creatinine: 0.83 mg/dL (ref 0.44–1.00)
GFR, Estimated: >60 mL/min (ref >60)
Glucose, Bld: 128 mg/dL — ABNORMAL HIGH (ref 70–99)
Potassium: 3.8 mmol/L (ref 3.5–5.1)
Sodium: 138 mmol/L (ref 135–145)
Total Bilirubin: 0.6 mg/dL (ref 0.0–1.2)
Total Protein: 5.8 g/dL — ABNORMAL LOW (ref 6.5–8.1)

## 2023-09-04 LAB — FERRITIN: Ferritin: 75 ng/mL (ref 11–307)

## 2023-09-04 LAB — MAGNESIUM: Magnesium: 1.4 mg/dL — ABNORMAL LOW (ref 1.7–2.4)

## 2023-09-04 LAB — TOTAL PROTEIN, URINE DIPSTICK: Protein, ur: 100 mg/dL — AB

## 2023-09-04 LAB — CEA (ACCESS): CEA (CHCC): 1106.65 ng/mL — ABNORMAL HIGH (ref 0.00–5.00)

## 2023-09-04 MED ORDER — PALONOSETRON HCL INJECTION 0.25 MG/5ML
0.2500 mg | Freq: Once | INTRAVENOUS | Status: AC
  Administered 2023-09-04: 0.25 mg via INTRAVENOUS
  Filled 2023-09-04: qty 5

## 2023-09-04 MED ORDER — MAGNESIUM SULFATE 2 GM/50ML IV SOLN
2.0000 g | Freq: Once | INTRAVENOUS | Status: AC
  Administered 2023-09-04: 2 g via INTRAVENOUS
  Filled 2023-09-04: qty 50

## 2023-09-04 MED ORDER — DEXTROSE 5 % IV SOLN: INTRAVENOUS | Status: DC

## 2023-09-04 MED ORDER — DEXAMETHASONE SODIUM PHOSPHATE 10 MG/ML IJ SOLN
10.0000 mg | Freq: Once | INTRAMUSCULAR | Status: AC
  Administered 2023-09-04: 10 mg via INTRAVENOUS
  Filled 2023-09-04: qty 1

## 2023-09-04 MED ORDER — ALTEPLASE 2 MG IJ SOLR
2.0000 mg | Freq: Once | INTRAMUSCULAR | Status: AC
  Administered 2023-09-04: 2 mg
  Filled 2023-09-04: qty 2

## 2023-09-04 MED ORDER — FLUOROURACIL CHEMO INJECTION 5 GM/100ML
1600.0000 mg/m2 | INTRAVENOUS | Status: DC
  Administered 2023-09-04: 2500 mg via INTRAVENOUS
  Filled 2023-09-04: qty 50

## 2023-09-04 MED ORDER — OXALIPLATIN CHEMO INJECTION 100 MG/20ML
85.0000 mg/m2 | Freq: Once | INTRAVENOUS | Status: AC
  Administered 2023-09-04: 150 mg via INTRAVENOUS
  Filled 2023-09-04: qty 20

## 2023-09-04 MED ORDER — DIPHENHYDRAMINE HCL 25 MG PO CAPS
25.0000 mg | ORAL_CAPSULE | Freq: Once | ORAL | Status: AC
Start: 2023-09-04 — End: 2023-09-04
  Administered 2023-09-04: 25 mg via ORAL
  Filled 2023-09-04: qty 1

## 2023-09-04 MED ORDER — FAMOTIDINE IN NACL 20-0.9 MG/50ML-% IV SOLN
20.0000 mg | Freq: Once | INTRAVENOUS | Status: AC
  Administered 2023-09-04: 20 mg via INTRAVENOUS
  Filled 2023-09-04: qty 50

## 2023-09-04 MED ORDER — LEUCOVORIN CALCIUM INJECTION 350 MG
200.0000 mg/m2 | Freq: Once | INTRAMUSCULAR | Status: AC
  Administered 2023-09-04: 336 mg via INTRAVENOUS
  Filled 2023-09-04: qty 16.8

## 2023-09-04 MED ORDER — FLUOROURACIL CHEMO INJECTION 500 MG/10ML
200.0000 mg/m2 | Freq: Once | INTRAVENOUS | Status: AC
  Administered 2023-09-04: 350 mg via INTRAVENOUS
  Filled 2023-09-04: qty 7

## 2023-09-04 NOTE — Progress Notes (Signed)
PER LISA Meoshia Billing,NP GIVE 2 GRAMS OF MAGNESIUM TODAY FOR MAG LEVEL OF 1.4. OK TO TREAT BUT HOLD AVASTIN TODAY. PATIENT TO BE SET UP FOR BLOOD TRANSFUSION DUE TO HGB OF 7.1

## 2023-09-04 NOTE — Patient Instructions (Signed)
[facility]   Discharge Instructions:  The chemotherapy medication bag should finish at 46 hours, 96 hours, or 7 days. For example, if your pump is scheduled for 46 hours and it was put on at 4:00 p.m., it should finish at 2:00 p.m. the day it is scheduled to come off regardless of your appointment time.     Estimated time to finish at 1:30, Thursday, September 06, 2023.   If the display on your pump reads "Low Volume" and it is beeping, take the batteries out of the pump and come to the cancer center for it to be taken off.   If the pump alarms go off prior to the pump reading "Low Volume" then call [phone] and someone can assist you.  If the plunger comes out and the chemotherapy medication is leaking out, please use your home chemo spill kit to clean up the spill. Do NOT use paper towels or other household products.  If you have problems or questions regarding your pump, please call either [phone] (24 hours a day) or the cancer center Monday-Friday 8:00 a.m.- 4:30 p.m. at the clinic number and we will assist you. If you are unable to get assistance, then go to the nearest Emergency Department and ask the staff to contact the IV team for assistance.   Thank you for choosing [facility] to provide your oncology and hematology care.   If you have a lab appointment with the Cancer Center, please go directly to the Cancer Center and check in at the registration area.   Wear comfortable clothing and clothing appropriate for easy access to any Portacath or PICC line.   We strive to give you quality time with your provider. You may need to reschedule your appointment if you arrive late (15 or more minutes).  Arriving late affects you and other patients whose appointments are after yours.  Also, if you miss three or more appointments without notifying the office, you may be dismissed from the clinic at the provider's  discretion.      For prescription refill requests, have your pharmacy contact our office and allow 72 hours for refills to be completed.    Today you received the following chemotherapy and/or immunotherapy agents OXALIPLATIN/LEUCOVORIN/FLUOROURACIL/MAGNESIUM      To help prevent nausea and vomiting after your treatment, we encourage you to take your nausea medication as directed.  BELOW ARE SYMPTOMS THAT SHOULD BE REPORTED IMMEDIATELY: *FEVER GREATER THAN 100.4 F (38 C) OR HIGHER *CHILLS OR SWEATING *NAUSEA AND VOMITING THAT IS NOT CONTROLLED WITH YOUR NAUSEA MEDICATION *UNUSUAL SHORTNESS OF BREATH *UNUSUAL BRUISING OR BLEEDING *URINARY PROBLEMS (pain or burning when urinating, or frequent urination) *BOWEL PROBLEMS (unusual diarrhea, constipation, pain near the anus) TENDERNESS IN MOUTH AND THROAT WITH OR WITHOUT PRESENCE OF ULCERS (sore throat, sores in mouth, or a toothache) UNUSUAL RASH, SWELLING OR PAIN  UNUSUAL VAGINAL DISCHARGE OR ITCHING   Items with * indicate a potential emergency and should be followed up as soon as possible or go to the Emergency Department if any problems should occur.  Please show the CHEMOTHERAPY ALERT CARD or IMMUNOTHERAPY ALERT CARD at check-in to the Emergency Department and triage nurse.  Should you have questions after your visit or need to cancel or reschedule your appointment, please contact [facility]  Dept: [phone]  and follow the prompts.  Office  hours are 8:00 a.m. to 4:30 p.m. Monday - Friday. Please note that voicemails left after 4:00 p.m. may not be returned until the following business day.  We are closed weekends and major holidays. You have access to a nurse at all times for urgent questions. Please call the main number to the clinic Dept: [phone] and follow the prompts.   For any non-urgent questions, you may also contact your provider using MyChart. We now offer  e-Visits for anyone 43 and older to request care online for non-urgent symptoms. For details visit [email or web address].   Also download the MyChart app! Go to the app store, search "MyChart", open the app, select [place], and log in with your MyChart username and password.  Oxaliplatin Injection What is this medication? OXALIPLATIN (ox AL i PLA tin) treats colorectal cancer. It works by slowing down the growth of cancer cells. This medicine may be used for other purposes; ask your health care provider or pharmacist if you have questions. COMMON BRAND NAME(S): Eloxatin What should I tell my care team before I take this medication? They need to know if you have any of these conditions: Heart disease History of irregular heartbeat or rhythm Liver disease Low blood cell levels (white cells, red cells, and platelets) Lung or breathing disease, such as asthma Take medications that treat or prevent blood clots Tingling of the fingers, toes, or other nerve disorder An unusual or allergic reaction to oxaliplatin, other medications, foods, dyes, or preservatives If you or your partner are pregnant or trying to get pregnant Breast-feeding How should I use this medication? This medication is injected into a vein. It is given by your care team in a hospital or clinic setting. Talk to your care team about the use of this medication in children. Special care may be needed. Overdosage: If you think you have taken too much of this medicine contact a poison control center or emergency room at once. NOTE: This medicine is only for you. Do not share this medicine with others. What if I miss a dose? Keep appointments for follow-up doses. It is important not to miss a dose. Call your care team if you are unable to keep an appointment. What may interact with this medication? Do not take this medication with any of the following: Cisapride Dronedarone Pimozide Thioridazine This medication may also  interact with the following: Aspirin and aspirin-like medications Certain medications that treat or prevent blood clots, such as warfarin, apixaban, dabigatran, and rivaroxaban Cisplatin Cyclosporine Diuretics Medications for infection, such as acyclovir, adefovir, amphotericin B, bacitracin, cidofovir, foscarnet, ganciclovir, gentamicin, pentamidine, vancomycin NSAIDs, medications for pain and inflammation, such as ibuprofen or naproxen Other medications that cause heart rhythm changes Pamidronate Zoledronic acid This list may not describe all possible interactions. Give your health care provider a list of all the medicines, herbs, non-prescription drugs, or dietary supplements you use. Also tell them if you smoke, drink alcohol, or use illegal drugs. Some items may interact with your medicine. What should I watch for while using this medication? Your condition will be monitored carefully while you are receiving this medication. You may need blood work while taking this medication. This medication may make you feel generally unwell. This is not uncommon as chemotherapy can affect healthy cells as well as cancer cells. Report any side effects. Continue your course of treatment even though you feel ill unless your care team tells you to stop. This medication may increase your risk of getting an infection. Call your care  team for advice if you get a fever, chills, sore throat, or other symptoms of a cold or flu. Do not treat yourself. Try to avoid being around people who are sick. Avoid taking medications that contain aspirin, acetaminophen, ibuprofen, naproxen, or ketoprofen unless instructed by your care team. These medications may hide a fever. Be careful brushing or flossing your teeth or using a toothpick because you may get an infection or bleed more easily. If you have any dental work done, tell your dentist you are receiving this medication. This medication can make you more sensitive to  cold. Do not drink cold drinks or use ice. Cover exposed skin before coming in contact with cold temperatures or cold objects. When out in cold weather wear warm clothing and cover your mouth and nose to warm the air that goes into your lungs. Tell your care team if you get sensitive to the cold. Talk to your care team if you or your partner are pregnant or think either of you might be pregnant. This medication can cause serious birth defects if taken during pregnancy and for 9 months after the last dose. A negative pregnancy test is required before starting this medication. A reliable form of contraception is recommended while taking this medication and for 9 months after the last dose. Talk to your care team about effective forms of contraception. Do not father a child while taking this medication and for 6 months after the last dose. Use a condom while having sex during this time period. Do not breastfeed while taking this medication and for 3 months after the last dose. This medication may cause infertility. Talk to your care team if you are concerned about your fertility. What side effects may I notice from receiving this medication? Side effects that you should report to your care team as soon as possible: Allergic reactions--skin rash, itching, hives, swelling of the face, lips, tongue, or throat Bleeding--bloody or black, tar-like stools, vomiting blood or [place] material that looks like coffee grounds, red or dark [place] urine, small red or purple spots on skin, unusual bruising or bleeding Dry cough, shortness of breath or trouble breathing Heart rhythm changes--fast or irregular heartbeat, dizziness, feeling faint or lightheaded, chest pain, trouble breathing Infection--fever, chills, cough, sore throat, wounds that don't heal, pain or trouble when passing urine, general feeling of discomfort or being unwell Liver injury--right upper belly pain, loss of appetite, nausea, light-colored stool, dark  yellow or [place] urine, yellowing skin or eyes, unusual weakness or fatigue Low red blood cell level--unusual weakness or fatigue, dizziness, headache, trouble breathing Muscle injury--unusual weakness or fatigue, muscle pain, dark yellow or [place] urine, decrease in amount of urine Pain, tingling, or numbness in the hands or feet Sudden and severe headache, confusion, change in vision, seizures, which may be signs of posterior reversible encephalopathy syndrome (PRES) Unusual bruising or bleeding Side effects that usually do not require medical attention (report to your care team if they continue or are bothersome): Diarrhea Nausea Pain, redness, or swelling with sores inside the mouth or throat Unusual weakness or fatigue Vomiting This list may not describe all possible side effects. Call your doctor for medical advice about side effects. You may report side effects to FDA at 1-800-FDA-1088. Where should I keep my medication? This medication is given in a hospital or clinic. It will not be stored at home. NOTE: This sheet is a summary. It may not cover all possible information. If you have questions about this medicine, talk  to your doctor, pharmacist, or health care provider.  2024 Elsevier/Gold Standard (2023-07-13 00:00:00) Leucovorin Injection What is this medication? LEUCOVORIN (loo koe VOR in) prevents side effects from certain medications, such as methotrexate. It works by increasing folate levels. This helps protect healthy cells in your body. It may also be used to treat anemia caused by low levels of folate. It can also be used with fluorouracil, a type of chemotherapy, to treat colorectal cancer. It works by increasing the effects of fluorouracil in the body. This medicine may be used for other purposes; ask your health care provider or pharmacist if you have questions. What should I tell my care team before I take this medication? They need to know if you have any of these  conditions: Anemia from low levels of vitamin B12 in the blood An unusual or allergic reaction to leucovorin, folic acid, other medications, foods, dyes, or preservatives Pregnant or trying to get pregnant Breastfeeding How should I use this medication? This medication is injected into a vein or a muscle. It is given by your care team in a hospital or clinic setting. Talk to your care team about the use of this medication in children. Special care may be needed. Overdosage: If you think you have taken too much of this medicine contact a poison control center or emergency room at once. NOTE: This medicine is only for you. Do not share this medicine with others. What if I miss a dose? Keep appointments for follow-up doses. It is important not to miss your dose. Call your care team if you are unable to keep an appointment. What may interact with this medication? Capecitabine Fluorouracil Phenobarbital Phenytoin Primidone Trimethoprim;sulfamethoxazole This list may not describe all possible interactions. Give your health care provider a list of all the medicines, herbs, non-prescription drugs, or dietary supplements you use. Also tell them if you smoke, drink alcohol, or use illegal drugs. Some items may interact with your medicine. What should I watch for while using this medication? Your condition will be monitored carefully while you are receiving this medication. This medication may increase the side effects of 5-fluorouracil. Tell your care team if you have diarrhea or mouth sores that do not get better or that get worse. What side effects may I notice from receiving this medication? Side effects that you should report to your care team as soon as possible: Allergic reactions--skin rash, itching, hives, swelling of the face, lips, tongue, or throat This list may not describe all possible side effects. Call your doctor for medical advice about side effects. You may report side effects to  FDA at 1-800-FDA-1088. Where should I keep my medication? This medication is given in a hospital or clinic. It will not be stored at home. NOTE: This sheet is a summary. It may not cover all possible information. If you have questions about this medicine, talk to your doctor, pharmacist, or health care provider.  2024 Elsevier/Gold Standard (2022-01-03 00:00:00)  Fluorouracil Injection What is this medication? FLUOROURACIL (flure oh YOOR a sil) treats some types of cancer. It works by slowing down the growth of cancer cells. This medicine may be used for other purposes; ask your health care provider or pharmacist if you have questions. COMMON BRAND NAME(S): Adrucil What should I tell my care team before I take this medication? They need to know if you have any of these conditions: Blood disorders Dihydropyrimidine dehydrogenase (DPD) deficiency Infection, such as chickenpox, cold sores, herpes Kidney disease Liver disease Poor nutrition  Recent or ongoing radiation therapy An unusual or allergic reaction to fluorouracil, other medications, foods, dyes, or preservatives If you or your partner are pregnant or trying to get pregnant Breast-feeding How should I use this medication? This medication is injected into a vein. It is administered by your care team in a hospital or clinic setting. Talk to your care team about the use of this medication in children. Special care may be needed. Overdosage: If you think you have taken too much of this medicine contact a poison control center or emergency room at once. NOTE: This medicine is only for you. Do not share this medicine with others. What if I miss a dose? Keep appointments for follow-up doses. It is important not to miss your dose. Call your care team if you are unable to keep an appointment. What may interact with this medication? Do not take this medication with any of the following: Live virus vaccines This medication may also  interact with the following: Medications that treat or prevent blood clots, such as warfarin, enoxaparin, dalteparin This list may not describe all possible interactions. Give your health care provider a list of all the medicines, herbs, non-prescription drugs, or dietary supplements you use. Also tell them if you smoke, drink alcohol, or use illegal drugs. Some items may interact with your medicine. What should I watch for while using this medication? Your condition will be monitored carefully while you are receiving this medication. This medication may make you feel generally unwell. This is not uncommon as chemotherapy can affect healthy cells as well as cancer cells. Report any side effects. Continue your course of treatment even though you feel ill unless your care team tells you to stop. In some cases, you may be given additional medications to help with side effects. Follow all directions for their use. This medication may increase your risk of getting an infection. Call your care team for advice if you get a fever, chills, sore throat, or other symptoms of a cold or flu. Do not treat yourself. Try to avoid being around people who are sick. This medication may increase your risk to bruise or bleed. Call your care team if you notice any unusual bleeding. Be careful brushing or flossing your teeth or using a toothpick because you may get an infection or bleed more easily. If you have any dental work done, tell your dentist you are receiving this medication. Avoid taking medications that contain aspirin, acetaminophen, ibuprofen, naproxen, or ketoprofen unless instructed by your care team. These medications may hide a fever. Do not treat diarrhea with over the counter products. Contact your care team if you have diarrhea that lasts more than 2 days or if it is severe and watery. This medication can make you more sensitive to the sun. Keep out of the sun. If you cannot avoid being in the sun, wear  protective clothing and sunscreen. Do not use sun lamps, tanning beds, or tanning booths. Talk to your care team if you or your partner wish to become pregnant or think you might be pregnant. This medication can cause serious birth defects if taken during pregnancy and for 3 months after the last dose. A reliable form of contraception is recommended while taking this medication and for 3 months after the last dose. Talk to your care team about effective forms of contraception. Do not father a child while taking this medication and for 3 months after the last dose. Use a condom while having sex during this  time period. Do not breastfeed while taking this medication. This medication may cause infertility. Talk to your care team if you are concerned about your fertility. What side effects may I notice from receiving this medication? Side effects that you should report to your care team as soon as possible: Allergic reactions--skin rash, itching, hives, swelling of the face, lips, tongue, or throat Heart attack--pain or tightness in the chest, shoulders, arms, or jaw, nausea, shortness of breath, cold or clammy skin, feeling faint or lightheaded Heart failure--shortness of breath, swelling of the ankles, feet, or hands, sudden weight gain, unusual weakness or fatigue Heart rhythm changes--fast or irregular heartbeat, dizziness, feeling faint or lightheaded, chest pain, trouble breathing High ammonia level--unusual weakness or fatigue, confusion, loss of appetite, nausea, vomiting, seizures Infection--fever, chills, cough, sore throat, wounds that don't heal, pain or trouble when passing urine, general feeling of discomfort or being unwell Low red blood cell level--unusual weakness or fatigue, dizziness, headache, trouble breathing Pain, tingling, or numbness in the hands or feet, muscle weakness, change in vision, confusion or trouble speaking, loss of balance or coordination, trouble walking,  seizures Redness, swelling, and blistering of the skin over hands and feet Severe or prolonged diarrhea Unusual bruising or bleeding Side effects that usually do not require medical attention (report to your care team if they continue or are bothersome): Dry skin Headache Increased tears Nausea Pain, redness, or swelling with sores inside the mouth or throat Sensitivity to light Vomiting This list may not describe all possible side effects. Call your doctor for medical advice about side effects. You may report side effects to FDA at 1-800-FDA-1088. Where should I keep my medication? This medication is given in a hospital or clinic. It will not be stored at home. NOTE: This sheet is a summary. It may not cover all possible information. If you have questions about this medicine, talk to your doctor, pharmacist, or health care provider.  2024 Elsevier/Gold Standard (2021-12-06 00:00:00)  Hypomagnesemia Hypomagnesemia is a condition in which the level of magnesium in the blood is too low. Magnesium is a mineral that is found in many foods. It is used in many different processes in the body. Hypomagnesemia can affect every organ in the body. In severe cases, it can cause life-threatening problems. What are the causes? This condition may be caused by: Not getting enough magnesium in your diet or not having enough healthy foods to eat (malnutrition). Problems with magnesium absorption in the intestines. Dehydration. Excessive use of alcohol. Vomiting. Severe or long-term (chronic) diarrhea. Some medicines, including medicines that make you urinate more often (diuretics). Certain diseases, such as kidney disease, diabetes, celiac disease, and overactive thyroid. What are the signs or symptoms? Symptoms of this condition include: Loss of appetite, nausea, and vomiting. Involuntary shaking or trembling of a body part (tremor). Muscle weakness or tingling in the arms and legs. Sudden  tightening of muscles (muscle spasms). Confusion. Psychiatric issues, such as: Depression and irritability. Psychosis. A feeling of fluttering of the heart (palpitations). Seizures. These symptoms are more severe if magnesium levels drop suddenly. How is this diagnosed? This condition may be diagnosed based on: Your symptoms and medical history. A physical exam. Blood and urine tests. How is this treated? Treatment depends on the cause and the severity of the condition. It may be treated by: Taking a magnesium supplement. This can be taken in pill form. If the condition is severe, magnesium is usually given through an IV. Making changes to your diet. You  may be directed to eat foods that have a lot of magnesium, such as green leafy vegetables, peas, beans, and nuts. Not drinking alcohol. If you are struggling not to drink, ask your health care provider for help. Follow these instructions at home: Eating and drinking     Make sure that your diet includes foods with magnesium. Foods that have a lot of magnesium in them include: Green leafy vegetables, such as spinach and broccoli. Beans and peas. Nuts and seeds, such as almonds and sunflower seeds. Whole grains, such as whole grain bread and fortified cereals. Drink fluids that contain salts and minerals (electrolytes), such as sports drinks, when you are active. Do not drink alcohol. General instructions Take over-the-counter and prescription medicines only as told by your health care provider. Take magnesium supplements as directed if your health care provider tells you to take them. Have your magnesium levels monitored as told by your health care provider. Keep all follow-up visits. This is important. Contact a health care provider if: You get worse instead of better. Your symptoms return. Get help right away if: You develop severe muscle weakness. You have trouble breathing. You feel that your heart is racing. These  symptoms may represent a serious problem that is an emergency. Do not wait to see if the symptoms will go away. Get medical help right away. Call your local emergency services (911 in the [place] Do not drive yourself to the hospital. Summary Hypomagnesemia is a condition in which the level of magnesium in the blood is too low. Hypomagnesemia can affect every organ in the body. Treatment may include eating more foods that contain magnesium, taking magnesium supplements, and not drinking alcohol. Have your magnesium levels monitored as told by your health care provider. This information is not intended to replace advice given to you by your health care provider. Make sure you discuss any questions you have with your health care provider. Document Revised: 12/28/2020 Document Reviewed: 12/28/2020 Elsevier Patient Education  [employer]24 [employer].

## 2023-09-05 ENCOUNTER — Ambulatory Visit: Payer: BC Managed Care – PPO | Admitting: Cardiology

## 2023-09-05 LAB — PREPARE RBC (CROSSMATCH)

## 2023-09-05 NOTE — Addendum Note (Signed)
Addended by: Dimitri Ped on: 09/05/2023 08:43 AM   Modules accepted: Orders

## 2023-09-06 ENCOUNTER — Other Ambulatory Visit: Payer: Self-pay

## 2023-09-06 ENCOUNTER — Inpatient Hospital Stay: Payer: BC Managed Care – PPO

## 2023-09-06 VITALS — BP 160/64 | HR 66 | Temp 98.2°F | Resp 18

## 2023-09-06 DIAGNOSIS — Z5111 Encounter for antineoplastic chemotherapy: Secondary | ICD-10-CM | POA: Diagnosis not present

## 2023-09-06 DIAGNOSIS — C182 Malignant neoplasm of ascending colon: Secondary | ICD-10-CM

## 2023-09-06 DIAGNOSIS — D649 Anemia, unspecified: Secondary | ICD-10-CM

## 2023-09-06 MED ORDER — SODIUM CHLORIDE 0.9% IV SOLUTION
250.0000 mL | INTRAVENOUS | Status: DC
  Administered 2023-09-06: 100 mL via INTRAVENOUS

## 2023-09-06 MED ORDER — HEPARIN SOD (PORK) LOCK FLUSH 100 UNIT/ML IV SOLN
500.0000 [IU] | Freq: Once | INTRAVENOUS | Status: AC | PRN
  Administered 2023-09-06: 500 [IU]

## 2023-09-06 MED ORDER — SODIUM CHLORIDE 0.9% FLUSH
10.0000 mL | INTRAVENOUS | Status: DC | PRN
  Administered 2023-09-06: 10 mL

## 2023-09-06 NOTE — Patient Instructions (Signed)

## 2023-09-07 ENCOUNTER — Ambulatory Visit: Payer: Self-pay | Admitting: Cardiology

## 2023-09-07 LAB — TYPE AND SCREEN
ABO/RH(D): A POS
Antibody Screen: NEGATIVE
Unit division: 0
Unit division: 0

## 2023-09-07 LAB — BPAM RBC
Blood Product Expiration Date: 202502112359
Blood Product Expiration Date: 202502112359
ISSUE DATE / TIME: 202501230718
ISSUE DATE / TIME: 202501230718
Unit Type and Rh: 6200
Unit Type and Rh: 6200

## 2023-09-15 ENCOUNTER — Other Ambulatory Visit: Payer: Self-pay | Admitting: Oncology

## 2023-09-18 ENCOUNTER — Other Ambulatory Visit: Payer: Self-pay | Admitting: *Deleted

## 2023-09-18 DIAGNOSIS — D649 Anemia, unspecified: Secondary | ICD-10-CM

## 2023-09-19 ENCOUNTER — Inpatient Hospital Stay: Payer: BC Managed Care – PPO

## 2023-09-19 ENCOUNTER — Inpatient Hospital Stay: Payer: BC Managed Care – PPO | Attending: Oncology

## 2023-09-19 ENCOUNTER — Inpatient Hospital Stay (HOSPITAL_BASED_OUTPATIENT_CLINIC_OR_DEPARTMENT_OTHER): Payer: BC Managed Care – PPO | Admitting: Oncology

## 2023-09-19 VITALS — BP 172/79 | HR 87 | Temp 98.3°F

## 2023-09-19 VITALS — BP 139/70 | HR 87 | Temp 98.1°F | Resp 18 | Ht 67.0 in | Wt 136.0 lb

## 2023-09-19 DIAGNOSIS — C787 Secondary malignant neoplasm of liver and intrahepatic bile duct: Secondary | ICD-10-CM | POA: Diagnosis not present

## 2023-09-19 DIAGNOSIS — Z5112 Encounter for antineoplastic immunotherapy: Secondary | ICD-10-CM | POA: Diagnosis present

## 2023-09-19 DIAGNOSIS — C182 Malignant neoplasm of ascending colon: Secondary | ICD-10-CM | POA: Diagnosis not present

## 2023-09-19 DIAGNOSIS — Z5111 Encounter for antineoplastic chemotherapy: Secondary | ICD-10-CM | POA: Insufficient documentation

## 2023-09-19 DIAGNOSIS — D6481 Anemia due to antineoplastic chemotherapy: Secondary | ICD-10-CM | POA: Diagnosis not present

## 2023-09-19 DIAGNOSIS — Z86718 Personal history of other venous thrombosis and embolism: Secondary | ICD-10-CM | POA: Insufficient documentation

## 2023-09-19 DIAGNOSIS — K7460 Unspecified cirrhosis of liver: Secondary | ICD-10-CM | POA: Diagnosis not present

## 2023-09-19 DIAGNOSIS — Z452 Encounter for adjustment and management of vascular access device: Secondary | ICD-10-CM | POA: Insufficient documentation

## 2023-09-19 DIAGNOSIS — D649 Anemia, unspecified: Secondary | ICD-10-CM

## 2023-09-19 LAB — CMP (CANCER CENTER ONLY)
ALT: 5 U/L (ref 0–44)
AST: 18 U/L (ref 15–41)
Albumin: 2.9 g/dL — ABNORMAL LOW (ref 3.5–5.0)
Alkaline Phosphatase: 155 U/L — ABNORMAL HIGH (ref 38–126)
Anion gap: 7 (ref 5–15)
BUN: 15 mg/dL (ref 8–23)
CO2: 27 mmol/L (ref 22–32)
Calcium: 8.6 mg/dL — ABNORMAL LOW (ref 8.9–10.3)
Chloride: 101 mmol/L (ref 98–111)
Creatinine: 0.56 mg/dL (ref 0.44–1.00)
GFR, Estimated: >60 mL/min (ref >60)
Glucose, Bld: 91 mg/dL (ref 70–99)
Potassium: 4 mmol/L (ref 3.5–5.1)
Sodium: 135 mmol/L (ref 135–145)
Total Bilirubin: 0.6 mg/dL (ref 0.0–1.2)
Total Protein: 6.3 g/dL — ABNORMAL LOW (ref 6.5–8.1)

## 2023-09-19 LAB — CBC WITH DIFFERENTIAL (CANCER CENTER ONLY)
Abs Immature Granulocytes: 0.01 10*3/uL (ref 0.00–0.07)
Basophils Absolute: 0 10*3/uL (ref 0.0–0.1)
Basophils Relative: 1 %
Eosinophils Absolute: 0.4 10*3/uL (ref 0.0–0.5)
Eosinophils Relative: 7 %
HCT: 31.6 % — ABNORMAL LOW (ref 36.0–46.0)
Hemoglobin: 9.7 g/dL — ABNORMAL LOW (ref 12.0–15.0)
Immature Granulocytes: 0 %
Lymphocytes Relative: 9 %
Lymphs Abs: 0.5 10*3/uL — ABNORMAL LOW (ref 0.7–4.0)
MCH: 25.5 pg — ABNORMAL LOW (ref 26.0–34.0)
MCHC: 30.7 g/dL (ref 30.0–36.0)
MCV: 82.9 fL (ref 80.0–100.0)
Monocytes Absolute: 1.1 10*3/uL — ABNORMAL HIGH (ref 0.1–1.0)
Monocytes Relative: 19 %
Neutro Abs: 3.8 10*3/uL (ref 1.7–7.7)
Neutrophils Relative %: 64 %
Platelet Count: 290 10*3/uL (ref 150–400)
RBC: 3.81 MIL/uL — ABNORMAL LOW (ref 3.87–5.11)
RDW: 19.5 % — ABNORMAL HIGH (ref 11.5–15.5)
WBC Count: 5.8 10*3/uL (ref 4.0–10.5)
nRBC: 0 % (ref 0.0–0.2)

## 2023-09-19 LAB — SAMPLE TO BLOOD BANK

## 2023-09-19 LAB — TOTAL PROTEIN, URINE DIPSTICK: Protein, ur: 30 mg/dL — AB

## 2023-09-19 LAB — MAGNESIUM: Magnesium: 1.9 mg/dL (ref 1.7–2.4)

## 2023-09-19 MED ORDER — DIPHENHYDRAMINE HCL 50 MG/ML IJ SOLN
50.0000 mg | Freq: Once | INTRAMUSCULAR | Status: AC | PRN
  Administered 2023-09-19: 25 mg via INTRAVENOUS

## 2023-09-19 MED ORDER — OXALIPLATIN CHEMO INJECTION 100 MG/20ML
85.0000 mg/m2 | Freq: Once | INTRAVENOUS | Status: AC
  Administered 2023-09-19: 150 mg via INTRAVENOUS
  Filled 2023-09-19: qty 20

## 2023-09-19 MED ORDER — LEUCOVORIN CALCIUM INJECTION 350 MG
200.0000 mg/m2 | Freq: Once | INTRAVENOUS | Status: AC
  Administered 2023-09-19: 336 mg via INTRAVENOUS
  Filled 2023-09-19: qty 16.8

## 2023-09-19 MED ORDER — DEXTROSE 5 % IV SOLN: INTRAVENOUS | Status: DC

## 2023-09-19 MED ORDER — FAMOTIDINE IN NACL 20-0.9 MG/50ML-% IV SOLN
20.0000 mg | Freq: Once | INTRAVENOUS | Status: AC
  Administered 2023-09-19: 20 mg via INTRAVENOUS
  Filled 2023-09-19: qty 50

## 2023-09-19 MED ORDER — PANTOPRAZOLE SODIUM 40 MG PO TBEC: 40.0000 mg | DELAYED_RELEASE_TABLET | Freq: Every day | ORAL | 5 refills | Status: DC

## 2023-09-19 MED ORDER — OXYCODONE HCL 5 MG PO TABS: 5.0000 mg | ORAL_TABLET | ORAL | 0 refills | Status: DC | PRN

## 2023-09-19 MED ORDER — PALONOSETRON HCL INJECTION 0.25 MG/5ML
0.2500 mg | Freq: Once | INTRAVENOUS | Status: AC
  Administered 2023-09-19: 0.25 mg via INTRAVENOUS
  Filled 2023-09-19: qty 5

## 2023-09-19 MED ORDER — FLUOROURACIL CHEMO INJECTION 500 MG/10ML
200.0000 mg/m2 | Freq: Once | INTRAVENOUS | Status: AC
  Administered 2023-09-19: 350 mg via INTRAVENOUS
  Filled 2023-09-19: qty 7

## 2023-09-19 MED ORDER — SODIUM CHLORIDE 0.9 % IV SOLN
5.0000 mg/kg | Freq: Once | INTRAVENOUS | Status: AC
  Administered 2023-09-19: 300 mg via INTRAVENOUS
  Filled 2023-09-19: qty 12

## 2023-09-19 MED ORDER — SODIUM CHLORIDE 0.9 % IV SOLN: Freq: Once | INTRAVENOUS | Status: DC | PRN

## 2023-09-19 MED ORDER — METHYLPREDNISOLONE SODIUM SUCC 125 MG IJ SOLR
125.0000 mg | Freq: Once | INTRAMUSCULAR | Status: AC | PRN
  Administered 2023-09-19: 125 mg via INTRAVENOUS

## 2023-09-19 MED ORDER — DEXAMETHASONE SODIUM PHOSPHATE 10 MG/ML IJ SOLN
10.0000 mg | Freq: Once | INTRAMUSCULAR | Status: AC
  Administered 2023-09-19: 10 mg via INTRAVENOUS
  Filled 2023-09-19: qty 1

## 2023-09-19 MED ORDER — SODIUM CHLORIDE 0.9 % IV SOLN: INTRAVENOUS | Status: DC

## 2023-09-19 MED ORDER — SODIUM CHLORIDE 0.9 % IV SOLN
1600.0000 mg/m2 | INTRAVENOUS | Status: DC
  Administered 2023-09-19: 2500 mg via INTRAVENOUS
  Filled 2023-09-19: qty 50

## 2023-09-19 MED ORDER — DIPHENHYDRAMINE HCL 25 MG PO CAPS
25.0000 mg | ORAL_CAPSULE | Freq: Once | ORAL | Status: AC
  Administered 2023-09-19: 25 mg via ORAL
  Filled 2023-09-19: qty 1

## 2023-09-19 NOTE — Progress Notes (Signed)
 Hypersensitivity Reaction note  Date of event: 09/19/23 Time of event: 1414 Generic name of drug involved: Oxaliplatin  Name of provider notified of the hypersensitivity reaction: Olam Ned, NP Was agent that likely caused hypersensitivity reaction added to Allergies List within EMR? YES Chain of events including reaction signs/symptoms, treatment administered, and outcome (e.g., drug resumed; drug discontinued; sent to Emergency Department; etc.)  Patient 2nd dose of oxaliplatin  restart. Patient was returning from the restroom with this RN noted her to be very flushed. Pt complained of being very hot. This RN immediately paused Oxaliplatin /Leucovorin  infusion and obtained vital signs (SEE CHART). IVF's started and Olam Ned, NP notified.  Olam Ned PIETY at chairside.Emergency drugs given. Benadryl /Solumedrol See MAR. Noted rash on arms and hands, Oxaliplatin /Leucovorin  discontinued. Patient stabilized. Vital signs improved. Olam Ned, NP at chairside patient to receive Fluorouracil  push and pump and discharged home with her son.    Andree LITTIE Ned, RN 09/19/2023 4:15 PM

## 2023-09-19 NOTE — Progress Notes (Signed)
 Patient seen by Dr. Arley Hof today  Vitals are within treatment parameters:Yes   Labs are within treatment parameters: Yes   Treatment plan has been signed: Yes   Per physician team, Patient is ready for treatment and there are NO modifications to the treatment plan.  Will receive Avastin  today that was held with last cycle.

## 2023-09-19 NOTE — Patient Instructions (Signed)
 [facility] - A DEPT OF [facility]   Discharge Instructions:  The chemotherapy medication bag should finish at 46 hours, 96 hours, or 7 days. For example, if your pump is scheduled for 46 hours and it was put on at 4:00 p.m., it should finish at 2:00 p.m. the day it is scheduled to come off regardless of your appointment time.     Estimated time to finish at 1:45 Friday, September 21, 2023.   If the display on your pump reads Low Volume and it is beeping, take the batteries out of the pump and come to the cancer center for it to be taken off.   If the pump alarms go off prior to the pump reading Low Volume then call [phone] and someone can assist you.  If the plunger comes out and the chemotherapy medication is leaking out, please use your home chemo spill kit to clean up the spill. Do NOT use paper towels or other household products.  If you have problems or questions regarding your pump, please call either [phone] (24 hours a day) or the cancer center Monday-Friday 8:00 a.m.- 4:30 p.m. at the clinic number and we will assist you. If you are unable to get assistance, then go to the nearest Emergency Department and ask the staff to contact the IV team for assistance.   Thank you for choosing [facility] to provide your oncology and hematology care.   If you have a lab appointment with the Cancer Center, please go directly to the Cancer Center and check in at the registration area.   Wear comfortable clothing and clothing appropriate for easy access to any Portacath or PICC line.   We strive to give you quality time with your provider. You may need to reschedule your appointment if you arrive late (15 or more minutes).  Arriving late affects you and other patients whose appointments are after yours.  Also, if you miss three or more appointments without notifying the office, you may be dismissed from the clinic at the provider's  discretion.      For prescription refill requests, have your pharmacy contact our office and allow 72 hours for refills to be completed.    Today you received the following chemotherapy and/or immunotherapy agents Avastin /Oxaliplatin /Leucovorin /Fluorouracil       To help prevent nausea and vomiting after your treatment, we encourage you to take your nausea medication as directed.  BELOW ARE SYMPTOMS THAT SHOULD BE REPORTED IMMEDIATELY: *FEVER GREATER THAN 100.4 F (38 C) OR HIGHER *CHILLS OR SWEATING *NAUSEA AND VOMITING THAT IS NOT CONTROLLED WITH YOUR NAUSEA MEDICATION *UNUSUAL SHORTNESS OF BREATH *UNUSUAL BRUISING OR BLEEDING *URINARY PROBLEMS (pain or burning when urinating, or frequent urination) *BOWEL PROBLEMS (unusual diarrhea, constipation, pain near the anus) TENDERNESS IN MOUTH AND THROAT WITH OR WITHOUT PRESENCE OF ULCERS (sore throat, sores in mouth, or a toothache) UNUSUAL RASH, SWELLING OR PAIN  UNUSUAL VAGINAL DISCHARGE OR ITCHING   Items with * indicate a potential emergency and should be followed up as soon as possible or go to the Emergency Department if any problems should occur.  Please show the CHEMOTHERAPY ALERT CARD or IMMUNOTHERAPY ALERT CARD at check-in to the Emergency Department and triage nurse.  Should you have questions after your visit or need to cancel or reschedule your appointment, please contact [facility] - A DEPT OF [facility]  Dept: [phone]  and follow the prompts.  Office  hours are 8:00 a.m. to 4:30 p.m. Monday - Friday. Please note that voicemails left after 4:00 p.m. may not be returned until the following business day.  We are closed weekends and major holidays. You have access to a nurse at all times for urgent questions. Please call the main number to the clinic Dept: [phone] and follow the prompts.   For any non-urgent questions, you may also contact your provider using MyChart. We now offer e-Visits  for anyone 69 and older to request care online for non-urgent symptoms. For details visit [email or web address].   Also download the MyChart app! Go to the app store, search MyChart, open the app, select [place], and log in with your MyChart username and password.  Bevacizumab  Injection What is this medication? BEVACIZUMAB  (be va SIZ yoo mab) treats some types of cancer. It works by blocking a protein that causes cancer cells to grow and multiply. This helps to slow or stop the spread of cancer cells. It is a monoclonal antibody. This medicine may be used for other purposes; ask your health care provider or pharmacist if you have questions. COMMON BRAND NAME(S): Alymsys , Avastin , MVASI , Vegzalma, Zirabev  What should I tell my care team before I take this medication? They need to know if you have any of these conditions: Blood clots Coughing up blood Having or recent surgery Heart failure High blood pressure History of a connection between 2 or more body parts that do not usually connect (fistula) History of a tear in your stomach or intestines Protein in your urine An unusual or allergic reaction to bevacizumab , other medications, foods, dyes, or preservatives Pregnant or trying to get pregnant Breast-feeding How should I use this medication? This medication is injected into a vein. It is given by your care team in a hospital or clinic setting. Talk to your care team the use of this medication in children. Special care may be needed. Overdosage: If you think you have taken too much of this medicine contact a poison control center or emergency room at once. NOTE: This medicine is only for you. Do not share this medicine with others. What if I miss a dose? Keep appointments for follow-up doses. It is important not to miss your dose. Call your care team if you are unable to keep an appointment. What may interact with this medication? Interactions are not expected. This list may not  describe all possible interactions. Give your health care provider a list of all the medicines, herbs, non-prescription drugs, or dietary supplements you use. Also tell them if you smoke, drink alcohol , or use illegal drugs. Some items may interact with your medicine. What should I watch for while using this medication? Your condition will be monitored carefully while you are receiving this medication. You may need blood work while taking this medication. This medication may make you feel generally unwell. This is not uncommon as chemotherapy can affect healthy cells as well as cancer cells. Report any side effects. Continue your course of treatment even though you feel ill unless your care team tells you to stop. This medication may increase your risk to bruise or bleed. Call your care team if you notice any unusual bleeding. Before having surgery, talk to your care team to make sure it is ok. This medication can increase the risk of poor healing of your surgical site or wound. You will need to stop this medication for 28 days before surgery. After surgery, wait at least 28 days before restarting this medication.  Make sure the surgical site or wound is healed enough before restarting this medication. Talk to your care team if questions. Talk to your care team if you may be pregnant. Serious birth defects can occur if you take this medication during pregnancy and for 6 months after the last dose. Contraception is recommended while taking this medication and for 6 months after the last dose. Your care team can help you find the option that works for you. Do not breastfeed while taking this medication and for 6 months after the last dose. This medication can cause infertility. Talk to your care team if you are concerned about your fertility. What side effects may I notice from receiving this medication? Side effects that you should report to your care team as soon as possible: Allergic reactions--skin rash,  itching, hives, swelling of the face, lips, tongue, or throat Bleeding--bloody or black, tar-like stools, vomiting blood or [place] material that looks like coffee grounds, red or dark [place] urine, small red or purple spots on skin, unusual bruising or bleeding Blood clot--pain, swelling, or warmth in the leg, shortness of breath, chest pain Heart attack--pain or tightness in the chest, shoulders, arms, or jaw, nausea, shortness of breath, cold or clammy skin, feeling faint or lightheaded Heart failure--shortness of breath, swelling of the ankles, feet, or hands, sudden weight gain, unusual weakness or fatigue Increase in blood pressure Infection--fever, chills, cough, sore throat, wounds that don't heal, pain or trouble when passing urine, general feeling of discomfort or being unwell Infusion reactions--chest pain, shortness of breath or trouble breathing, feeling faint or lightheaded Kidney injury--decrease in the amount of urine, swelling of the ankles, hands, or feet Stomach pain that is severe, does not go away, or gets worse Stroke--sudden numbness or weakness of the face, arm, or leg, trouble speaking, confusion, trouble walking, loss of balance or coordination, dizziness, severe headache, change in vision Sudden and severe headache, confusion, change in vision, seizures, which may be signs of posterior reversible encephalopathy syndrome (PRES) Side effects that usually do not require medical attention (report to your care team if they continue or are bothersome): Back pain Change in taste Diarrhea Dry skin Increased tears Nosebleed This list may not describe all possible side effects. Call your doctor for medical advice about side effects. You may report side effects to FDA at 1-800-FDA-1088. Where should I keep my medication? This medication is given in a hospital or clinic. It will not be stored at home. NOTE: This sheet is a summary. It may not cover all possible information. If you  have questions about this medicine, talk to your doctor, pharmacist, or health care provider.  2024 Elsevier/Gold Standard (2021-12-16 00:00:00)  Oxaliplatin  Injection What is this medication? OXALIPLATIN  (ox AL i PLA tin) treats colorectal cancer. It works by slowing down the growth of cancer cells. This medicine may be used for other purposes; ask your health care provider or pharmacist if you have questions. COMMON BRAND NAME(S): Eloxatin  What should I tell my care team before I take this medication? They need to know if you have any of these conditions: Heart disease History of irregular heartbeat or rhythm Liver disease Low blood cell levels (white cells, red cells, and platelets) Lung or breathing disease, such as asthma Take medications that treat or prevent blood clots Tingling of the fingers, toes, or other nerve disorder An unusual or allergic reaction to oxaliplatin , other medications, foods, dyes, or preservatives If you or your partner are pregnant or trying to get  pregnant Breast-feeding How should I use this medication? This medication is injected into a vein. It is given by your care team in a hospital or clinic setting. Talk to your care team about the use of this medication in children. Special care may be needed. Overdosage: If you think you have taken too much of this medicine contact a poison control center or emergency room at once. NOTE: This medicine is only for you. Do not share this medicine with others. What if I miss a dose? Keep appointments for follow-up doses. It is important not to miss a dose. Call your care team if you are unable to keep an appointment. What may interact with this medication? Do not take this medication with any of the following: Cisapride Dronedarone Pimozide Thioridazine This medication may also interact with the following: Aspirin and aspirin-like medications Certain medications that treat or prevent blood clots, such as  warfarin, apixaban, dabigatran, and rivaroxaban Cisplatin Cyclosporine Diuretics Medications for infection, such as acyclovir, adefovir, amphotericin B, bacitracin, cidofovir, foscarnet, ganciclovir, gentamicin, pentamidine, vancomycin NSAIDs, medications for pain and inflammation, such as ibuprofen  or naproxen Other medications that cause heart rhythm changes Pamidronate Zoledronic acid This list may not describe all possible interactions. Give your health care provider a list of all the medicines, herbs, non-prescription drugs, or dietary supplements you use. Also tell them if you smoke, drink alcohol , or use illegal drugs. Some items may interact with your medicine. What should I watch for while using this medication? Your condition will be monitored carefully while you are receiving this medication. You may need blood work while taking this medication. This medication may make you feel generally unwell. This is not uncommon as chemotherapy can affect healthy cells as well as cancer cells. Report any side effects. Continue your course of treatment even though you feel ill unless your care team tells you to stop. This medication may increase your risk of getting an infection. Call your care team for advice if you get a fever, chills, sore throat, or other symptoms of a cold or flu. Do not treat yourself. Try to avoid being around people who are sick. Avoid taking medications that contain aspirin, acetaminophen , ibuprofen , naproxen, or ketoprofen unless instructed by your care team. These medications may hide a fever. Be careful brushing or flossing your teeth or using a toothpick because you may get an infection or bleed more easily. If you have any dental work done, tell your dentist you are receiving this medication. This medication can make you more sensitive to cold. Do not drink cold drinks or use ice. Cover exposed skin before coming in contact with cold temperatures or cold objects. When  out in cold weather wear warm clothing and cover your mouth and nose to warm the air that goes into your lungs. Tell your care team if you get sensitive to the cold. Talk to your care team if you or your partner are pregnant or think either of you might be pregnant. This medication can cause serious birth defects if taken during pregnancy and for 9 months after the last dose. A negative pregnancy test is required before starting this medication. A reliable form of contraception is recommended while taking this medication and for 9 months after the last dose. Talk to your care team about effective forms of contraception. Do not father a child while taking this medication and for 6 months after the last dose. Use a condom while having sex during this time period. Do not breastfeed while  taking this medication and for 3 months after the last dose. This medication may cause infertility. Talk to your care team if you are concerned about your fertility. What side effects may I notice from receiving this medication? Side effects that you should report to your care team as soon as possible: Allergic reactions--skin rash, itching, hives, swelling of the face, lips, tongue, or throat Bleeding--bloody or black, tar-like stools, vomiting blood or [place] material that looks like coffee grounds, red or dark [place] urine, small red or purple spots on skin, unusual bruising or bleeding Dry cough, shortness of breath or trouble breathing Heart rhythm changes--fast or irregular heartbeat, dizziness, feeling faint or lightheaded, chest pain, trouble breathing Infection--fever, chills, cough, sore throat, wounds that don't heal, pain or trouble when passing urine, general feeling of discomfort or being unwell Liver injury--right upper belly pain, loss of appetite, nausea, light-colored stool, dark yellow or [place] urine, yellowing skin or eyes, unusual weakness or fatigue Low red blood cell level--unusual weakness or fatigue,  dizziness, headache, trouble breathing Muscle injury--unusual weakness or fatigue, muscle pain, dark yellow or [place] urine, decrease in amount of urine Pain, tingling, or numbness in the hands or feet Sudden and severe headache, confusion, change in vision, seizures, which may be signs of posterior reversible encephalopathy syndrome (PRES) Unusual bruising or bleeding Side effects that usually do not require medical attention (report to your care team if they continue or are bothersome): Diarrhea Nausea Pain, redness, or swelling with sores inside the mouth or throat Unusual weakness or fatigue Vomiting This list may not describe all possible side effects. Call your doctor for medical advice about side effects. You may report side effects to FDA at 1-800-FDA-1088. Where should I keep my medication? This medication is given in a hospital or clinic. It will not be stored at home. NOTE: This sheet is a summary. It may not cover all possible information. If you have questions about this medicine, talk to your doctor, pharmacist, or health care provider.  2024 Elsevier/Gold Standard (2023-07-13 00:00:00) Leucovorin  Injection What is this medication? LEUCOVORIN  (loo koe VOR in) prevents side effects from certain medications, such as methotrexate. It works by increasing folate levels. This helps protect healthy cells in your body. It may also be used to treat anemia caused by low levels of folate. It can also be used with fluorouracil , a type of chemotherapy, to treat colorectal cancer. It works by increasing the effects of fluorouracil  in the body. This medicine may be used for other purposes; ask your health care provider or pharmacist if you have questions. What should I tell my care team before I take this medication? They need to know if you have any of these conditions: Anemia from low levels of vitamin B12 in the blood An unusual or allergic reaction to leucovorin , folic acid, other  medications, foods, dyes, or preservatives Pregnant or trying to get pregnant Breastfeeding How should I use this medication? This medication is injected into a vein or a muscle. It is given by your care team in a hospital or clinic setting. Talk to your care team about the use of this medication in children. Special care may be needed. Overdosage: If you think you have taken too much of this medicine contact a poison control center or emergency room at once. NOTE: This medicine is only for you. Do not share this medicine with others. What if I miss a dose? Keep appointments for follow-up doses. It is important not to miss your dose.  Call your care team if you are unable to keep an appointment. What may interact with this medication? Capecitabine  Fluorouracil  Phenobarbital Phenytoin Primidone Trimethoprim;sulfamethoxazole This list may not describe all possible interactions. Give your health care provider a list of all the medicines, herbs, non-prescription drugs, or dietary supplements you use. Also tell them if you smoke, drink alcohol , or use illegal drugs. Some items may interact with your medicine. What should I watch for while using this medication? Your condition will be monitored carefully while you are receiving this medication. This medication may increase the side effects of 5-fluorouracil . Tell your care team if you have diarrhea or mouth sores that do not get better or that get worse. What side effects may I notice from receiving this medication? Side effects that you should report to your care team as soon as possible: Allergic reactions--skin rash, itching, hives, swelling of the face, lips, tongue, or throat This list may not describe all possible side effects. Call your doctor for medical advice about side effects. You may report side effects to FDA at 1-800-FDA-1088. Where should I keep my medication? This medication is given in a hospital or clinic. It will not be stored  at home. NOTE: This sheet is a summary. It may not cover all possible information. If you have questions about this medicine, talk to your doctor, pharmacist, or health care provider.  2024 Elsevier/Gold Standard (2022-01-03 00:00:00)  Fluorouracil  Injection What is this medication? FLUOROURACIL  (flure oh YOOR a sil) treats some types of cancer. It works by slowing down the growth of cancer cells. This medicine may be used for other purposes; ask your health care provider or pharmacist if you have questions. COMMON BRAND NAME(S): Adrucil  What should I tell my care team before I take this medication? They need to know if you have any of these conditions: Blood disorders Dihydropyrimidine dehydrogenase (DPD) deficiency Infection, such as chickenpox, cold sores, herpes Kidney disease Liver disease Poor nutrition Recent or ongoing radiation therapy An unusual or allergic reaction to fluorouracil , other medications, foods, dyes, or preservatives If you or your partner are pregnant or trying to get pregnant Breast-feeding How should I use this medication? This medication is injected into a vein. It is administered by your care team in a hospital or clinic setting. Talk to your care team about the use of this medication in children. Special care may be needed. Overdosage: If you think you have taken too much of this medicine contact a poison control center or emergency room at once. NOTE: This medicine is only for you. Do not share this medicine with others. What if I miss a dose? Keep appointments for follow-up doses. It is important not to miss your dose. Call your care team if you are unable to keep an appointment. What may interact with this medication? Do not take this medication with any of the following: Live virus vaccines This medication may also interact with the following: Medications that treat or prevent blood clots, such as warfarin, enoxaparin , dalteparin This list may not  describe all possible interactions. Give your health care provider a list of all the medicines, herbs, non-prescription drugs, or dietary supplements you use. Also tell them if you smoke, drink alcohol , or use illegal drugs. Some items may interact with your medicine. What should I watch for while using this medication? Your condition will be monitored carefully while you are receiving this medication. This medication may make you feel generally unwell. This is not uncommon as chemotherapy can affect  healthy cells as well as cancer cells. Report any side effects. Continue your course of treatment even though you feel ill unless your care team tells you to stop. In some cases, you may be given additional medications to help with side effects. Follow all directions for their use. This medication may increase your risk of getting an infection. Call your care team for advice if you get a fever, chills, sore throat, or other symptoms of a cold or flu. Do not treat yourself. Try to avoid being around people who are sick. This medication may increase your risk to bruise or bleed. Call your care team if you notice any unusual bleeding. Be careful brushing or flossing your teeth or using a toothpick because you may get an infection or bleed more easily. If you have any dental work done, tell your dentist you are receiving this medication. Avoid taking medications that contain aspirin, acetaminophen , ibuprofen , naproxen, or ketoprofen unless instructed by your care team. These medications may hide a fever. Do not treat diarrhea with over the counter products. Contact your care team if you have diarrhea that lasts more than 2 days or if it is severe and watery. This medication can make you more sensitive to the sun. Keep out of the sun. If you cannot avoid being in the sun, wear protective clothing and sunscreen. Do not use sun lamps, tanning beds, or tanning booths. Talk to your care team if you or your partner  wish to become pregnant or think you might be pregnant. This medication can cause serious birth defects if taken during pregnancy and for 3 months after the last dose. A reliable form of contraception is recommended while taking this medication and for 3 months after the last dose. Talk to your care team about effective forms of contraception. Do not father a child while taking this medication and for 3 months after the last dose. Use a condom while having sex during this time period. Do not breastfeed while taking this medication. This medication may cause infertility. Talk to your care team if you are concerned about your fertility. What side effects may I notice from receiving this medication? Side effects that you should report to your care team as soon as possible: Allergic reactions--skin rash, itching, hives, swelling of the face, lips, tongue, or throat Heart attack--pain or tightness in the chest, shoulders, arms, or jaw, nausea, shortness of breath, cold or clammy skin, feeling faint or lightheaded Heart failure--shortness of breath, swelling of the ankles, feet, or hands, sudden weight gain, unusual weakness or fatigue Heart rhythm changes--fast or irregular heartbeat, dizziness, feeling faint or lightheaded, chest pain, trouble breathing High ammonia level--unusual weakness or fatigue, confusion, loss of appetite, nausea, vomiting, seizures Infection--fever, chills, cough, sore throat, wounds that don't heal, pain or trouble when passing urine, general feeling of discomfort or being unwell Low red blood cell level--unusual weakness or fatigue, dizziness, headache, trouble breathing Pain, tingling, or numbness in the hands or feet, muscle weakness, change in vision, confusion or trouble speaking, loss of balance or coordination, trouble walking, seizures Redness, swelling, and blistering of the skin over hands and feet Severe or prolonged diarrhea Unusual bruising or bleeding Side effects  that usually do not require medical attention (report to your care team if they continue or are bothersome): Dry skin Headache Increased tears Nausea Pain, redness, or swelling with sores inside the mouth or throat Sensitivity to light Vomiting This list may not describe all possible side effects. Call your doctor  for medical advice about side effects. You may report side effects to FDA at 1-800-FDA-1088. Where should I keep my medication? This medication is given in a hospital or clinic. It will not be stored at home. NOTE: This sheet is a summary. It may not cover all possible information. If you have questions about this medicine, talk to your doctor, pharmacist, or health care provider.  2024 Elsevier/Gold Standard (2021-12-06 00:00:00)

## 2023-09-19 NOTE — Addendum Note (Signed)
 Addended by: Roseline Conine on: 09/19/2023 03:49 PM   Modules accepted: Orders

## 2023-09-19 NOTE — Progress Notes (Addendum)
  [facility] OFF[facility] PROGRESS NOTE   Diagnosis: Colon cancer  INTERVAL HISTORY:   Stacy Burns returns as scheduled.  She completed a cycle of FOLFOX on 09/04/2023.  No nausea/vomiting, mouth sores, or diarrhea.  No symptom of allergic reaction.  No bleeding.  She has intermittent episodes of burning discomfort in the subxiphoid region.  This occurs after drinking juice.  She takes Tums.  Objective:  Vital signs in last 24 hours:  Blood pressure 139/70, pulse 87, temperature 98.1 F (36.7 C), temperature source Temporal, resp. rate 18, height 5' 7 (1.702 m), weight 136 lb (61.7 kg), SpO2 100%.    HEENT: No thrush or ulcers Resp: Lungs clear bilaterally Cardio: Regular rate and rhythm GI: The liver is palpable throughout the upper abdomen, no splenomegaly, nontender Vascular: No leg edema  Skin: Dryness with superficial desquamation over the back  Portacath/PICC-without erythema  Lab Results:  Lab Results  Component Value Date   WBC 5.8 09/19/2023   HGB 9.7 (L) 09/19/2023   HCT 31.6 (L) 09/19/2023   MCV 82.9 09/19/2023   PLT 290 09/19/2023   NEUTROABS 3.8 09/19/2023    CMP  Lab Results  Component Value Date   NA 135 09/19/2023   K 4.0 09/19/2023   CL 101 09/19/2023   CO2 27 09/19/2023   GLUCOSE 91 09/19/2023   BUN 15 09/19/2023   CREATININE 0.56 09/19/2023   CALCIUM  8.6 (L) 09/19/2023   PROT 6.3 (L) 09/19/2023   ALBUMIN 2.9 (L) 09/19/2023   AST 18 09/19/2023   ALT 5 09/19/2023   ALKPHOS 155 (H) 09/19/2023   BILITOT 0.6 09/19/2023   GFRNONAA >60 09/19/2023   GFRAA >60 05/06/2020    Lab Results  Component Value Date   CEA1 54.57 (H) 12/30/2020   CEA 1,106.65 (H) 09/04/2023     Medications: I have reviewed the patient's current medications.   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  Cirrhosis.  Innumerable liver  lesions. Colonoscopy 12/18/2019-ulcerated partially obstructing large mass in the mid ascending colon.  Biopsy invasive adenocarcinoma; preserved expression major MMR proteins Upper endoscopy 12/18/2019-normal esophagus, stomach, examined duodenum CEA 12/19/2019-8,923 Biopsy liver lesion 12/23/2019-adenocarcinoma Foundation 1-K-ras wild-type; tumor mutation burden greater than or equal to 10; microsatellite stable; BRAF V600E Cycle 1 FOLFOX 01/01/2020 Cycle 2 FOLFOX 01/15/2020  Cycle 3 FOLFOX 01/29/2020, Udenyca  added Cycle 4 FOLFOX 02/12/2020, Udenyca  held Cycle 5 FOLFOX 02/26/2020, Udenyca  Cycle 6 FOLFOX 03/11/2020, Udenyca  held CT abdomen/pelvis 03/16/2020-stable diffuse liver lesions, stable strictured appearing ascending colon, possible pericolonic implant, vague left lower lobe nodule Cycle 1 FOLFIRI/Avastin  04/06/2020 Cycle 2 FOLFIRI/Avastin  04/21/2020 Cycle 3 FOLFIRI/Avastin  05/06/2020 Cycle 4 FOLFIRI/Avastin  05/20/2020 Cycle 5 FOLFIRI/Avastin  06/03/2020 CTs 06/15/2020-mild to moderate improvement in hepatic metastasis.  No new or progressive disease.  Narrowing within the proximal ascending colon with upstream mild cecal and terminal ileum dilatation, similar. Cycle 6 FOLFIRI/Avastin  06/17/2020 Cycle 7 FOLFIRI/Avastin  07/01/2020 Cycle 8 FOLFIRI/Avastin  07/15/2020 Cycle 9 FOLFIRI/Avastin  07/29/2020-5-FU bolus eliminated, 5-FU infusion and irinotecan  dose reduced Cycle 10 FOLFIRI/Avastin  08/12/2020 CTs 08/23/2020- mild residual wall thickening involving the cecum/ascending colon.  Numerous liver metastases improved. Cycle 11 FOLFIRI/Avastin  08/25/2020 Cycle 12 FOLFIRI/Avastin  09/16/2020 Cycle 13 FOLFIRI/Avastin  10/07/2020 Cycle 14 FOLFIRI/Avastin  10/28/2020 Cycle 15 FOLFIRI/Avastin  11/18/2020 CT abdomen/pelvis 12/06/2020-mild improvement in multifocal hepatic metastases, wall thickening at the ascending colon with pericolonic inflammatory changes Cycle 16 FOLFIRI/Avastin  12/09/2020 Cycle 17 FOLFIRI/Avastin   12/30/2020 Cycle 18 FOLFIRI/Avastin  01/20/2021 Cycle 19 FOLFIRI/Avastin  02/10/2021  Cycle 20 FOLFIRI/Avastin  03/03/2021 Cycle 21 FOLFIRI/Avastin  03/24/2021 Cycle 22 FOLFIRI/Avastin  04/14/2021 CTs 05/04/2021-slight improvement in hepatic metastatic disease. Cycle 23 FOLFIRI/Avastin  05/05/2021 Cycle 24 FOLFIRI/Avastin  05/25/2021 Cycle 25 FOLFIRI/Avastin  06/15/2021 Cycle 26 FOLFIRI/Avastin  07/13/2021 Cycle 27 FOLFIRI/Avastin  08/02/2021 CTs 08/30/2021-majority of liver lesions are unchanged in size, 1 lesion adjacent to the gallbladder has increased, no adenopathy, persistent pericolonic stranding and peritoneal thickening at the cecum Progressive rise in CEA 08/31/2021-treatment changed to Xeloda /bevacizumab , began Xeoloda 09/22/2021 CEA stable 11/03/2021 Xeloda /bevacizumab  continued CTs 01/10/2022-increase in size of some of the liver lesions, others are stable, new tiny right lower lobe nodule, increased dilation of distal small bowel with increased wall thickening at the terminal ileum and ileocecal valve with 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, stable bilateral paracolic gutter peritoneal thickening Panitumumab / Encorafenib  01/12/2022 (encorafenib  01/19/2022) Panitumumab  02/02/2022 Panitumumab  02/20/2022 Panitumumab  03/06/2022 Panitumumab  03/20/2022 Panitumumab  04/03/2022 CTs 04/14/2022-decrease size of liver metastases, new borderline subcarinal adenopathy, improved peritoneal thickening, cirrhosis or pseudocirrhosis, persistent dilated cecum Encorafenib  and Panitumumab  continued Panitumumab  04/18/2022 Panitumumab  05/05/2022 Panitumumab  05/19/2022 Panitumumab  06/02/2022 Panitumumab  06/16/2022, 4g IV mag and begin oral mag po once daily  Panitumumab  06/30/2022 Panitumumab  07/14/2022, 2 g IV magnesium , increase oral magnesium  to 400 mg twice daily Panitumumab  07/28/2022, 2 g IV magnesium  CTs 07/28/2022-no change in hepatic metastases, no new signs of metastatic disease, persistent mural thickening at  the terminal ileum/cecum mild splenomegaly unchanged Panitumumab  08/18/2022 Panitumumab  09/01/2022 Panitumumab  09/15/2022 Panitumumab  09/29/2022 Panitumumab  10/13/2022 Panitumumab  10/27/2022 CTs 11/03/2022-unchanged hepatic metastases, no new lesions, 3.7 cm soft tissue density at the medial left kidney etiology unclear, improvement in wall thickening at the ileocecal junction development of appendiceal dilatation Panitumumab  11/10/2022 Panitumumab  11/24/2022 Panitumumab  12/08/2022 Panitumumab  12/22/2022 Panitumumab  01/05/2023 Panitumumab  02/02/2023 Panitumumab  02/16/2023 CTs 03/01/2023-unchanged cecal mass. Enlargement of left liver lesion, other liver lesions unchanged. Near complete resolution of soft tissue left renal pelvis. No pericardial effusion. Panitumumab  03/02/2023 Panitumumab  03/16/2023 Panitumumab  03/30/2023 Panitumumab  04/13/2023 Panitumumab  04/27/2023 Panitumumab  05/11/2023 CTs 05/19/2023-no evidence of metastatic disease in the chest, progressive porta hepatis adenopathy and enlargement of a coalescent left liver lesion, possible mildly enlarging left liver lesion, right colon mass less obvious-slightly improved? Panitumumab  05/25/2023, Encorafenib  continued Panitumumab  06/08/2023 Panitumumab  06/26/2023 Panitumumab  07/06/2023 Panitumumab  07/20/2023 CTs 07/22/2023-new and enlarged liver lesions.  Continued enlargement of metastatic porta hepatis and portacaval lymph nodes.  Newly enlarged retroperitoneal lymph nodes. Change in treatment to Lonsurf /Avastin -anticipate start date Lonsurf  08/14/2023, discontinued 08/15/2022 Cycle 1 FOLFOX 09/04/2023 2 FOLFOX/bevacizumab  09/19/2023 Cirrhosis 12/26/2019-hospital admission for right lower extremity DVT and GI bleed, treated with heparin  anticoagulation beginning 12/26/2019, converted to Lovenox  at discharge 12/29/2019 Lovenox  reduced to 40 mg daily 02/10/2021 secondary to bruising and nosebleeding History of low-grade fever-likely tumor fever COVID-19  infection January 2021 Admission 01/19/2023 with dyspnea/orthopnea, pericardial effusion, EKG changes suggestive of pericarditis Echocardiogram 01/20/2023-LVEF 60-65%, small to moderate circumferential pericardial effusion without tamponade physiology Echocardiogram 02/19/2022-LVEF 70-75%; trivial pericardial effusion. Anemia secondary to chemotherapy, chronic disease, and phlebotomy 2 units of packed red blood cells 02/03/2023     Disposition: Stacy Burns appears stable.  She tolerated the first cycle of FOLFOX well.  She will complete cycle 2 today.  Bevacizumab  will be added.  She will return for an office visit and chemotherapy in 2 weeks.  I refilled her prescription for oxycodone .  She will begin pantoprazole .  Arley Hof, MD  09/19/2023  11:56 AM  Addendum 3:45 PM-approximately 30 minutes into the Oxaliplatin  infusion Stacy Burns developed nausea, diffuse erythema of the face, neck, lower arms and hands.  She complained of  pruritus at the lower arms.  Oxaliplatin  was stopped.  She was given Benadryl  25 mg IV and Solu-Medrol  125 mg IV.  Symptoms resolved.  She remained stable.  She was not rechallenged with oxaliplatin .  She received Avastin , 5-FU bolus and pump.  Olam Ned, NP

## 2023-09-21 ENCOUNTER — Inpatient Hospital Stay: Payer: BC Managed Care – PPO

## 2023-09-21 VITALS — BP 165/76 | HR 86 | Temp 98.2°F | Resp 18

## 2023-09-21 DIAGNOSIS — C182 Malignant neoplasm of ascending colon: Secondary | ICD-10-CM

## 2023-09-21 DIAGNOSIS — Z5112 Encounter for antineoplastic immunotherapy: Secondary | ICD-10-CM | POA: Diagnosis not present

## 2023-09-21 MED ORDER — SODIUM CHLORIDE 0.9% FLUSH
10.0000 mL | INTRAVENOUS | Status: DC | PRN
  Administered 2023-09-21: 10 mL

## 2023-09-21 MED ORDER — HEPARIN SOD (PORK) LOCK FLUSH 100 UNIT/ML IV SOLN
500.0000 [IU] | Freq: Once | INTRAVENOUS | Status: AC | PRN
  Administered 2023-09-21: 500 [IU]

## 2023-09-21 NOTE — Patient Instructions (Signed)

## 2023-09-28 ENCOUNTER — Other Ambulatory Visit: Payer: Self-pay | Admitting: Oncology

## 2023-10-03 ENCOUNTER — Inpatient Hospital Stay: Payer: BC Managed Care – PPO

## 2023-10-03 ENCOUNTER — Other Ambulatory Visit: Payer: BC Managed Care – PPO

## 2023-10-03 ENCOUNTER — Encounter: Payer: Self-pay | Admitting: Nurse Practitioner

## 2023-10-03 ENCOUNTER — Other Ambulatory Visit (HOSPITAL_BASED_OUTPATIENT_CLINIC_OR_DEPARTMENT_OTHER): Payer: Self-pay

## 2023-10-03 ENCOUNTER — Inpatient Hospital Stay: Payer: BC Managed Care – PPO | Admitting: Nurse Practitioner

## 2023-10-03 VITALS — BP 145/86 | HR 76 | Resp 18

## 2023-10-03 DIAGNOSIS — C182 Malignant neoplasm of ascending colon: Secondary | ICD-10-CM

## 2023-10-03 DIAGNOSIS — Z5112 Encounter for antineoplastic immunotherapy: Secondary | ICD-10-CM | POA: Diagnosis not present

## 2023-10-03 LAB — CMP (CANCER CENTER ONLY)
ALT: 5 U/L (ref 0–44)
AST: 20 U/L (ref 15–41)
Albumin: 3.3 g/dL — ABNORMAL LOW (ref 3.5–5.0)
Alkaline Phosphatase: 157 U/L — ABNORMAL HIGH (ref 38–126)
Anion gap: 7 (ref 5–15)
BUN: 17 mg/dL (ref 8–23)
CO2: 26 mmol/L (ref 22–32)
Calcium: 8.8 mg/dL — ABNORMAL LOW (ref 8.9–10.3)
Chloride: 103 mmol/L (ref 98–111)
Creatinine: 0.68 mg/dL (ref 0.44–1.00)
GFR, Estimated: >60 mL/min (ref >60)
Glucose, Bld: 102 mg/dL — ABNORMAL HIGH (ref 70–99)
Potassium: 4.2 mmol/L (ref 3.5–5.1)
Sodium: 136 mmol/L (ref 135–145)
Total Bilirubin: 0.7 mg/dL (ref 0.0–1.2)
Total Protein: 6.6 g/dL (ref 6.5–8.1)

## 2023-10-03 LAB — CBC WITH DIFFERENTIAL (CANCER CENTER ONLY)
Abs Immature Granulocytes: 0.01 10*3/uL (ref 0.00–0.07)
Basophils Absolute: 0 10*3/uL (ref 0.0–0.1)
Basophils Relative: 1 %
Eosinophils Absolute: 0.3 10*3/uL (ref 0.0–0.5)
Eosinophils Relative: 5 %
HCT: 33.9 % — ABNORMAL LOW (ref 36.0–46.0)
Hemoglobin: 10.5 g/dL — ABNORMAL LOW (ref 12.0–15.0)
Immature Granulocytes: 0 %
Lymphocytes Relative: 9 %
Lymphs Abs: 0.6 10*3/uL — ABNORMAL LOW (ref 0.7–4.0)
MCH: 26.4 pg (ref 26.0–34.0)
MCHC: 31 g/dL (ref 30.0–36.0)
MCV: 85.2 fL (ref 80.0–100.0)
Monocytes Absolute: 0.8 10*3/uL (ref 0.1–1.0)
Monocytes Relative: 12 %
Neutro Abs: 4.8 10*3/uL (ref 1.7–7.7)
Neutrophils Relative %: 73 %
Platelet Count: 182 10*3/uL (ref 150–400)
RBC: 3.98 MIL/uL (ref 3.87–5.11)
RDW: 21.3 % — ABNORMAL HIGH (ref 11.5–15.5)
WBC Count: 6.6 10*3/uL (ref 4.0–10.5)
nRBC: 0 % (ref 0.0–0.2)

## 2023-10-03 LAB — TOTAL PROTEIN, URINE DIPSTICK: Protein, ur: 100 mg/dL — AB

## 2023-10-03 LAB — CEA (ACCESS): CEA (CHCC): 1244.85 ng/mL — ABNORMAL HIGH (ref 0.00–5.00)

## 2023-10-03 MED ORDER — SODIUM CHLORIDE 0.9 % IV SOLN: INTRAVENOUS | Status: DC

## 2023-10-03 MED ORDER — BEVACIZUMAB-AWWB CHEMO INJECTION 400 MG/16ML
5.0000 mg/kg | Freq: Once | INTRAVENOUS | Status: AC
  Administered 2023-10-03: 300 mg via INTRAVENOUS
  Filled 2023-10-03: qty 12

## 2023-10-03 MED ORDER — DEXTROSE 5 % IV SOLN
200.0000 mg/m2 | Freq: Once | INTRAVENOUS | Status: AC
  Administered 2023-10-03: 336 mg via INTRAVENOUS
  Filled 2023-10-03: qty 16.8

## 2023-10-03 MED ORDER — FLUOROURACIL CHEMO INJECTION 500 MG/10ML
200.0000 mg/m2 | Freq: Once | INTRAVENOUS | Status: AC
  Administered 2023-10-03: 350 mg via INTRAVENOUS
  Filled 2023-10-03: qty 7

## 2023-10-03 MED ORDER — OXYCODONE HCL 5 MG PO TABS
5.0000 mg | ORAL_TABLET | ORAL | 0 refills | Status: DC | PRN
  Filled 2023-10-03: qty 75, 7d supply, fill #0

## 2023-10-03 MED ORDER — SODIUM CHLORIDE 0.9 % IV SOLN
1600.0000 mg/m2 | INTRAVENOUS | Status: DC
  Administered 2023-10-03: 2500 mg via INTRAVENOUS
  Filled 2023-10-03: qty 50

## 2023-10-03 NOTE — Progress Notes (Unsigned)
Patient seen by Lonna Cobb NP today  Vitals are within treatment parameters:Yes   Labs are within treatment parameters: Yes urine protein 100  Treatment plan has been signed: Yes   Per physician team, Patient is ready for treatment. Please note the following modifications:no oxaliplatin

## 2023-10-03 NOTE — Progress Notes (Unsigned)
[facility] OFFICE PROGRESS NOTE   Diagnosis:  Colon cancer  INTERVAL HISTORY:   Stacy Burns returns as scheduled.  She began cycle 2 FOLFOX plus bevacizumab 09/19/2023.  Approximately 30 minutes into the Oxaliplatin infusion she developed nausea and diffuse erythema of the face, neck, lower arms and hands.  She had pruritus at the lower arms.  The oxaliplatin was stopped.  She was given Benadryl and Solu-Medrol.  Symptoms resolved.  She was not rechallenged.  Today she reports at the time of the onset of symptoms she felt very "flushed and overheated.  She went to the bathroom.  When she was ambulating back to her chair she felt very weak and short of breath.  She did not experience chest pain.  She had no nausea or vomiting after returning home.  No mouth sores.  No diarrhea.  No cold sensitivity.  No numbness or tingling in the hands or feet.  She reports multiple episodes of rectal bleeding since the last office visit both in the stool and with wiping.  The blood is bright red in appearance.  Objective:  Vital signs in last 24 hours:  Blood pressure 117/71, pulse 78, temperature 98.1 F (36.7 C), temperature source Temporal, resp. rate 18, height 5\' 7"  (1.702 m), weight 128 lb 3.2 oz (58.2 kg), SpO2 100%.    HEENT: No thrush or ulcers. Resp: Lungs clear bilaterally. Cardio: Regular rate and rhythm. GI: Liver is palpable throughout the upper abdomen. Vascular: No leg edema. Port-A-Cath without erythema.  Lab Results:  Lab Results  Component Value Date   WBC 6.6 10/03/2023   HGB 10.5 (L) 10/03/2023   HCT 33.9 (L) 10/03/2023   MCV 85.2 10/03/2023   PLT 182 10/03/2023   NEUTROABS 4.8 10/03/2023    Imaging:  No results found.  Medications: I have reviewed the patient's current medications.  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   Cirrhosis.  Innumerable liver lesions. Colonoscopy 12/18/2019-ulcerated partially obstructing large mass in the mid ascending colon.  Biopsy invasive adenocarcinoma; preserved expression major MMR proteins Upper endoscopy 12/18/2019-normal esophagus, stomach, examined duodenum CEA 12/19/2019-8,923 Biopsy liver lesion 12/23/2019-adenocarcinoma Foundation 1-K-ras wild-type; tumor mutation burden greater than or equal to 10; microsatellite stable; BRAF V600E Cycle 1 FOLFOX 01/01/2020 Cycle 2 FOLFOX 01/15/2020  Cycle 3 FOLFOX 01/29/2020, Udenyca added Cycle 4 FOLFOX 02/12/2020, Udenyca held Cycle 5 FOLFOX 02/26/2020, Udenyca Cycle 6 FOLFOX 03/11/2020, Udenyca held CT abdomen/pelvis 03/16/2020-stable diffuse liver lesions, stable strictured appearing ascending colon, possible pericolonic implant, vague left lower lobe nodule Cycle 1 FOLFIRI/Avastin 04/06/2020 Cycle 2 FOLFIRI/Avastin 04/21/2020 Cycle 3 FOLFIRI/Avastin 05/06/2020 Cycle 4 FOLFIRI/Avastin 05/20/2020 Cycle 5 FOLFIRI/Avastin 06/03/2020 CTs 06/15/2020-mild to moderate improvement in hepatic metastasis.  No new or progressive disease.  Narrowing within the proximal ascending colon with upstream mild cecal and terminal ileum dilatation, similar. Cycle 6 FOLFIRI/Avastin 06/17/2020 Cycle 7 FOLFIRI/Avastin 07/01/2020 Cycle 8 FOLFIRI/Avastin 07/15/2020 Cycle 9 FOLFIRI/Avastin 07/29/2020-5-FU bolus eliminated, 5-FU infusion and irinotecan dose reduced Cycle 10 FOLFIRI/Avastin 08/12/2020 CTs 08/23/2020- mild residual wall thickening involving the cecum/ascending colon.  Numerous liver metastases improved. Cycle 11 FOLFIRI/Avastin 08/25/2020 Cycle 12 FOLFIRI/Avastin 09/16/2020 Cycle 13 FOLFIRI/Avastin 10/07/2020 Cycle 14 FOLFIRI/Avastin 10/28/2020 Cycle 15 FOLFIRI/Avastin 11/18/2020 CT abdomen/pelvis 12/06/2020-mild improvement in multifocal hepatic metastases, wall thickening at the ascending colon with pericolonic inflammatory changes Cycle 16 FOLFIRI/Avastin  12/09/2020 Cycle 17 FOLFIRI/Avastin 12/30/2020 Cycle 18 FOLFIRI/Avastin 01/20/2021 Cycle 19 FOLFIRI/Avastin 02/10/2021 Cycle 20 FOLFIRI/Avastin  03/03/2021 Cycle 21 FOLFIRI/Avastin 03/24/2021 Cycle 22 FOLFIRI/Avastin 04/14/2021 CTs 05/04/2021-slight improvement in hepatic metastatic disease. Cycle 23 FOLFIRI/Avastin 05/05/2021 Cycle 24 FOLFIRI/Avastin 05/25/2021 Cycle 25 FOLFIRI/Avastin 06/15/2021 Cycle 26 FOLFIRI/Avastin 07/13/2021 Cycle 27 FOLFIRI/Avastin 08/02/2021 CTs 08/30/2021-majority of liver lesions are unchanged in size, 1 lesion adjacent to the gallbladder has increased, no adenopathy, persistent pericolonic stranding and peritoneal thickening at the cecum Progressive rise in CEA 08/31/2021-treatment changed to Xeloda/bevacizumab, began [place] 09/22/2021 CEA stable 11/03/2021 Xeloda/bevacizumab continued CTs 01/10/2022-increase in size of some of the liver lesions, others are stable, new tiny right lower lobe nodule, increased dilation of distal small bowel with increased wall thickening at the terminal ileum and ileocecal valve with 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, stable bilateral paracolic gutter peritoneal thickening Panitumumab/ Encorafenib 01/12/2022 (encorafenib 01/19/2022) Panitumumab 02/02/2022 Panitumumab 02/20/2022 Panitumumab 03/06/2022 Panitumumab 03/20/2022 Panitumumab 04/03/2022 CTs 04/14/2022-decrease size of liver metastases, new borderline subcarinal adenopathy, improved peritoneal thickening, cirrhosis or pseudocirrhosis, persistent dilated cecum Encorafenib and Panitumumab continued Panitumumab 04/18/2022 Panitumumab 05/05/2022 Panitumumab 05/19/2022 Panitumumab 06/02/2022 Panitumumab 06/16/2022, 4g IV mag and begin oral mag po once daily  Panitumumab 06/30/2022 Panitumumab 07/14/2022, 2 g IV magnesium, increase oral magnesium to 400 mg twice daily Panitumumab 07/28/2022, 2 g IV magnesium CTs 07/28/2022-no change in hepatic metastases, no new signs of metastatic  disease, persistent mural thickening at the terminal ileum/cecum mild splenomegaly unchanged Panitumumab 08/18/2022 Panitumumab 09/01/2022 Panitumumab 09/15/2022 Panitumumab 09/29/2022 Panitumumab 10/13/2022 Panitumumab 10/27/2022 CTs 11/03/2022-unchanged hepatic metastases, no new lesions, 3.7 cm soft tissue density at the medial left kidney etiology unclear, improvement in wall thickening at the ileocecal junction development of appendiceal dilatation Panitumumab 11/10/2022 Panitumumab 11/24/2022 Panitumumab 12/08/2022 Panitumumab 12/22/2022 Panitumumab 01/05/2023 Panitumumab 02/02/2023 Panitumumab 02/16/2023 CTs 03/01/2023-unchanged cecal mass. Enlargement of left liver lesion, other liver lesions unchanged. Near complete resolution of soft tissue left renal pelvis. No pericardial effusion. Panitumumab 03/02/2023 Panitumumab 03/16/2023 Panitumumab 03/30/2023 Panitumumab 04/13/2023 Panitumumab 04/27/2023 Panitumumab 05/11/2023 CTs 05/19/2023-no evidence of metastatic disease in the chest, progressive porta hepatis adenopathy and enlargement of a coalescent left liver lesion, possible mildly enlarging left liver lesion, right colon mass less obvious-slightly improved? Panitumumab 05/25/2023, Encorafenib continued Panitumumab 06/08/2023 Panitumumab 06/26/2023 Panitumumab 07/06/2023 Panitumumab 07/20/2023 CTs 07/22/2023-new and enlarged liver lesions.  Continued enlargement of metastatic porta hepatis and portacaval lymph nodes.  Newly enlarged retroperitoneal lymph nodes. Change in treatment to Lonsurf/Avastin-anticipate start date Lonsurf 08/14/2023, discontinued 08/15/2022 Cycle 1 FOLFOX 09/04/2023 Cycle 2 FOLFOX/bevacizumab 09/19/2023-oxaliplatin stopped about 30 minutes into the infusion due to a reaction (pruritus, rash, generalized weakness, dyspnea). Cirrhosis 12/26/2019-hospital admission for right lower extremity DVT and GI bleed, treated with heparin anticoagulation beginning 12/26/2019, converted to Lovenox  at discharge 12/29/2019 Lovenox reduced to 40 mg daily 02/10/2021 secondary to bruising and nosebleeding History of low-grade fever-likely "tumor" fever COVID-19 infection January 2021 Admission 01/19/2023 with dyspnea/orthopnea, pericardial effusion, EKG changes suggestive of pericarditis Echocardiogram 01/20/2023-LVEF 60-65%, small to moderate circumferential pericardial effusion without tamponade physiology Echocardiogram 02/19/2022-LVEF 70-75%; trivial pericardial effusion. Anemia secondary to chemotherapy, chronic disease, and phlebotomy 2 units of packed red blood cells 02/03/2023    Disposition: Stacy Burns appears stable.  She began cycle 2 FOLFOX plus bevacizumab 09/19/2023.  She had a reaction about 30 minutes into the oxaliplatin infusion with pruritus, rash, weakness and dyspnea.  Oxaliplatin was stopped and she received Benadryl and Solu-Medrol.  The symptoms resolved.  She was not rechallenged.  She completed the remainder of the regimen.  We decided to proceed with 5-FU plus bevacizumab today as scheduled.  Oxaliplatin will be  held.  We reviewed resuming oxaliplatin with the next treatment on a desensitization protocol.  She understands there continues to be the chance of a severe allergic reaction.  She is unsure if she wants to receive further oxaliplatin.  We reviewed other treatment options as previously outlined by Dr. Maryruth Hancock.  We will contact [facility] regarding her placed on the wait list for a clinical trial.  CBC and chemistry panel reviewed.  Labs adequate to proceed with 5-FU plus bevacizumab today as scheduled.  Hemoglobin is higher.  She will contact the office with further rectal bleeding.  She will return for follow up as scheduled in 2 weeks.  We are available to see her sooner if needed.  Patient seen with Dr. Truett Perna.  Lonna Cobb ANP/GNP-BC   10/03/2023  8:39 AM  This was a shared visit with Lonna Cobb.  Stacy Burns had an allergic reaction to oxaliplatin during cycle 2  FOLFOX/bevacizumab.  She was able to complete the 5-FU and bevacizumab.  We decided against a rechallenge with oxaliplatin unless she undergoes a desensitization protocol.  She will complete a cycle of 5-FU/bevacizumab today.  I will contact Dr. Maryruth Hancock to see where she stands on the [facility] trial waiting list.  I was present for greater than 50% of today's visit.  I performed medical decision making.  Mancel Bale, MD

## 2023-10-04 ENCOUNTER — Encounter: Payer: Self-pay | Admitting: Oncology

## 2023-10-04 ENCOUNTER — Other Ambulatory Visit: Payer: Self-pay | Admitting: *Deleted

## 2023-10-05 ENCOUNTER — Inpatient Hospital Stay: Payer: BC Managed Care – PPO

## 2023-10-05 VITALS — BP 158/67 | HR 83 | Temp 98.4°F | Resp 18

## 2023-10-05 DIAGNOSIS — C182 Malignant neoplasm of ascending colon: Secondary | ICD-10-CM

## 2023-10-05 DIAGNOSIS — Z5112 Encounter for antineoplastic immunotherapy: Secondary | ICD-10-CM | POA: Diagnosis not present

## 2023-10-05 MED ORDER — HEPARIN SOD (PORK) LOCK FLUSH 100 UNIT/ML IV SOLN
500.0000 [IU] | Freq: Once | INTRAVENOUS | Status: AC | PRN
  Administered 2023-10-05: 500 [IU]

## 2023-10-05 MED ORDER — SODIUM CHLORIDE 0.9% FLUSH
10.0000 mL | INTRAVENOUS | Status: DC | PRN
  Administered 2023-10-05: 10 mL

## 2023-10-05 NOTE — Patient Instructions (Signed)

## 2023-10-13 ENCOUNTER — Other Ambulatory Visit: Payer: Self-pay | Admitting: Nurse Practitioner

## 2023-10-13 DIAGNOSIS — C182 Malignant neoplasm of ascending colon: Secondary | ICD-10-CM

## 2023-10-14 ENCOUNTER — Other Ambulatory Visit: Payer: Self-pay | Admitting: Oncology

## 2023-10-15 ENCOUNTER — Other Ambulatory Visit: Payer: Self-pay | Admitting: Nurse Practitioner

## 2023-10-15 ENCOUNTER — Other Ambulatory Visit (HOSPITAL_BASED_OUTPATIENT_CLINIC_OR_DEPARTMENT_OTHER): Payer: Self-pay

## 2023-10-15 ENCOUNTER — Telehealth: Payer: Self-pay | Admitting: *Deleted

## 2023-10-15 DIAGNOSIS — C182 Malignant neoplasm of ascending colon: Secondary | ICD-10-CM

## 2023-10-15 MED ORDER — OXYCODONE HCL 5 MG PO TABS
5.0000 mg | ORAL_TABLET | ORAL | 0 refills | Status: DC | PRN
  Filled 2023-10-15: qty 75, 7d supply, fill #0

## 2023-10-15 NOTE — Telephone Encounter (Signed)
 Spoke with Stacy Burns today. Her grandson called over weekend to try and get her a refill on the oxycodone since she is getting low. Due to increased pain she is taking #2 every 4 hours. Will need medication today.

## 2023-10-17 ENCOUNTER — Ambulatory Visit (HOSPITAL_COMMUNITY)
Admission: RE | Admit: 2023-10-17 | Discharge: 2023-10-17 | Disposition: A | Discharge status: Home / Self Care | Source: Ambulatory Visit | Attending: Nurse Practitioner | Admitting: Nurse Practitioner

## 2023-10-17 ENCOUNTER — Inpatient Hospital Stay: Payer: BC Managed Care – PPO

## 2023-10-17 ENCOUNTER — Inpatient Hospital Stay: Payer: BC Managed Care – PPO | Attending: Oncology

## 2023-10-17 ENCOUNTER — Encounter: Payer: Self-pay | Admitting: Nurse Practitioner

## 2023-10-17 ENCOUNTER — Inpatient Hospital Stay: Payer: BC Managed Care – PPO | Admitting: Nurse Practitioner

## 2023-10-17 VITALS — BP 135/63 | HR 97 | Temp 98.1°F | Resp 18 | Ht 67.0 in | Wt 133.3 lb

## 2023-10-17 DIAGNOSIS — D6481 Anemia due to antineoplastic chemotherapy: Secondary | ICD-10-CM | POA: Diagnosis not present

## 2023-10-17 DIAGNOSIS — Z5112 Encounter for antineoplastic immunotherapy: Secondary | ICD-10-CM | POA: Diagnosis present

## 2023-10-17 DIAGNOSIS — C182 Malignant neoplasm of ascending colon: Secondary | ICD-10-CM

## 2023-10-17 DIAGNOSIS — Z95828 Presence of other vascular implants and grafts: Secondary | ICD-10-CM

## 2023-10-17 DIAGNOSIS — M549 Dorsalgia, unspecified: Secondary | ICD-10-CM | POA: Insufficient documentation

## 2023-10-17 DIAGNOSIS — R180 Malignant ascites: Secondary | ICD-10-CM | POA: Insufficient documentation

## 2023-10-17 DIAGNOSIS — K7460 Unspecified cirrhosis of liver: Secondary | ICD-10-CM | POA: Diagnosis not present

## 2023-10-17 DIAGNOSIS — C787 Secondary malignant neoplasm of liver and intrahepatic bile duct: Secondary | ICD-10-CM | POA: Insufficient documentation

## 2023-10-17 HISTORY — PX: IR PARACENTESIS: IMG2679

## 2023-10-17 LAB — CMP (CANCER CENTER ONLY)
ALT: 8 U/L (ref 0–44)
AST: 25 U/L (ref 15–41)
Albumin: 3.4 g/dL — ABNORMAL LOW (ref 3.5–5.0)
Alkaline Phosphatase: 153 U/L — ABNORMAL HIGH (ref 38–126)
Anion gap: 10 (ref 5–15)
BUN: 22 mg/dL (ref 8–23)
CO2: 26 mmol/L (ref 22–32)
Calcium: 9.1 mg/dL (ref 8.9–10.3)
Chloride: 102 mmol/L (ref 98–111)
Creatinine: 0.77 mg/dL (ref 0.44–1.00)
GFR, Estimated: >60 mL/min (ref >60)
Glucose, Bld: 166 mg/dL — ABNORMAL HIGH (ref 70–99)
Potassium: 3.9 mmol/L (ref 3.5–5.1)
Sodium: 138 mmol/L (ref 135–145)
Total Bilirubin: 0.7 mg/dL (ref 0.0–1.2)
Total Protein: 6.3 g/dL — ABNORMAL LOW (ref 6.5–8.1)

## 2023-10-17 LAB — CBC WITH DIFFERENTIAL (CANCER CENTER ONLY)
Abs Immature Granulocytes: 0.02 10*3/uL (ref 0.00–0.07)
Basophils Absolute: 0 10*3/uL (ref 0.0–0.1)
Basophils Relative: 0 %
Eosinophils Absolute: 0.2 10*3/uL (ref 0.0–0.5)
Eosinophils Relative: 2 %
HCT: 28.6 % — ABNORMAL LOW (ref 36.0–46.0)
Hemoglobin: 8.8 g/dL — ABNORMAL LOW (ref 12.0–15.0)
Immature Granulocytes: 0 %
Lymphocytes Relative: 6 %
Lymphs Abs: 0.4 10*3/uL — ABNORMAL LOW (ref 0.7–4.0)
MCH: 26.9 pg (ref 26.0–34.0)
MCHC: 30.8 g/dL (ref 30.0–36.0)
MCV: 87.5 fL (ref 80.0–100.0)
Monocytes Absolute: 0.8 10*3/uL (ref 0.1–1.0)
Monocytes Relative: 13 %
Neutro Abs: 5 10*3/uL (ref 1.7–7.7)
Neutrophils Relative %: 79 %
Platelet Count: 181 10*3/uL (ref 150–400)
RBC: 3.27 MIL/uL — ABNORMAL LOW (ref 3.87–5.11)
RDW: 23.5 % — ABNORMAL HIGH (ref 11.5–15.5)
WBC Count: 6.4 10*3/uL (ref 4.0–10.5)
nRBC: 0 % (ref 0.0–0.2)

## 2023-10-17 LAB — CEA (ACCESS): CEA (CHCC): 2846.38 ng/mL — ABNORMAL HIGH (ref 0.00–5.00)

## 2023-10-17 MED ORDER — SODIUM CHLORIDE 0.9% FLUSH
10.0000 mL | INTRAVENOUS | Status: AC | PRN
  Administered 2023-10-17: 10 mL via INTRAVENOUS

## 2023-10-17 MED ORDER — HEPARIN SOD (PORK) LOCK FLUSH 100 UNIT/ML IV SOLN
500.0000 [IU] | Freq: Once | INTRAVENOUS | Status: AC
  Administered 2023-10-17: 500 [IU] via INTRAVENOUS

## 2023-10-17 MED ORDER — LIDOCAINE HCL 1 % IJ SOLN
INTRAMUSCULAR | Status: AC
  Filled 2023-10-17: qty 20

## 2023-10-17 MED ORDER — LIDOCAINE HCL 1 % IJ SOLN
20.0000 mL | Freq: Once | INTRAMUSCULAR | Status: AC
  Administered 2023-10-17: 10 mL via INTRADERMAL

## 2023-10-17 NOTE — Progress Notes (Signed)
 Stacy Burns   Diagnosis: Colon cancer  INTERVAL HISTORY:   Stacy Burns returns as scheduled.  She completed a cycle of 5-FU plus bevacizumab 10/03/2023.  She had a single episode of nausea/vomiting.  She does not think this was related to chemotherapy.  No mouth sores.  No diarrhea.  1 "minor" nosebleed.  She continues to have pain at the upper abdomen.  She takes pain medication frequently.  Objective:  Vital signs in last 24 hours:  Blood pressure 135/63, pulse 97, temperature 98.1 F (36.7 C), temperature source Temporal, resp. rate 18, height 5\' 7"  (1.702 m), weight 133 lb 4.8 oz (60.5 kg), SpO2 100%.    HEENT: No thrush or ulcers. Resp: Lungs clear bilaterally. Cardio: Regular rate and rhythm. GI: Abdomen is distended, exam consistent with ascites.  Liver palpable upper mid abdomen, associated tenderness. Vascular: No leg edema. Neuro: Alert and oriented. Skin: Palms without erythema. Port-A-Cath without erythema.  Lab Results:  Lab Results  Component Value Date   WBC 6.4 10/17/2023   HGB 8.8 (L) 10/17/2023   HCT 28.6 (L) 10/17/2023   MCV 87.5 10/17/2023   PLT 181 10/17/2023   NEUTROABS 5.0 10/17/2023    Imaging:  No results found.  Medications: I have reviewed the patient's current medications.  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  Cirrhosis.  Innumerable liver lesions. Colonoscopy 12/18/2019-ulcerated partially obstructing large mass in the mid ascending colon.  Biopsy invasive adenocarcinoma; preserved expression major MMR proteins Upper endoscopy 12/18/2019-normal esophagus, stomach, examined duodenum CEA 12/19/2019-8,923 Biopsy liver lesion 12/23/2019-adenocarcinoma Foundation 1-K-ras wild-type; tumor mutation burden greater than or equal to 10; microsatellite stable; BRAF V600E Cycle 1 FOLFOX  01/01/2020 Cycle 2 FOLFOX 01/15/2020  Cycle 3 FOLFOX 01/29/2020, Udenyca added Cycle 4 FOLFOX 02/12/2020, Udenyca held Cycle 5 FOLFOX 02/26/2020, Udenyca Cycle 6 FOLFOX 03/11/2020, Udenyca held CT abdomen/pelvis 03/16/2020-stable diffuse liver lesions, stable strictured appearing ascending colon, possible pericolonic implant, vague left lower lobe nodule Cycle 1 FOLFIRI/Avastin 04/06/2020 Cycle 2 FOLFIRI/Avastin 04/21/2020 Cycle 3 FOLFIRI/Avastin 05/06/2020 Cycle 4 FOLFIRI/Avastin 05/20/2020 Cycle 5 FOLFIRI/Avastin 06/03/2020 CTs 06/15/2020-mild to moderate improvement in hepatic metastasis.  No new or progressive disease.  Narrowing within the proximal ascending colon with upstream mild cecal and terminal ileum dilatation, similar. Cycle 6 FOLFIRI/Avastin 06/17/2020 Cycle 7 FOLFIRI/Avastin 07/01/2020 Cycle 8 FOLFIRI/Avastin 07/15/2020 Cycle 9 FOLFIRI/Avastin 07/29/2020-5-FU bolus eliminated, 5-FU infusion and irinotecan dose reduced Cycle 10 FOLFIRI/Avastin 08/12/2020 CTs 08/23/2020- mild residual wall thickening involving the cecum/ascending colon.  Numerous liver metastases improved. Cycle 11 FOLFIRI/Avastin 08/25/2020 Cycle 12 FOLFIRI/Avastin 09/16/2020 Cycle 13 FOLFIRI/Avastin 10/07/2020 Cycle 14 FOLFIRI/Avastin 10/28/2020 Cycle 15 FOLFIRI/Avastin 11/18/2020 CT abdomen/pelvis 12/06/2020-mild improvement in multifocal hepatic metastases, wall thickening at the ascending colon with pericolonic inflammatory changes Cycle 16 FOLFIRI/Avastin 12/09/2020 Cycle 17 FOLFIRI/Avastin 12/30/2020 Cycle 18 FOLFIRI/Avastin 01/20/2021 Cycle 19 FOLFIRI/Avastin 02/10/2021 Cycle 20 FOLFIRI/Avastin 03/03/2021 Cycle 21 FOLFIRI/Avastin 03/24/2021 Cycle 22 FOLFIRI/Avastin 04/14/2021 CTs 05/04/2021-slight improvement in hepatic metastatic disease. Cycle 23 FOLFIRI/Avastin 05/05/2021 Cycle 24 FOLFIRI/Avastin 05/25/2021 Cycle 25 FOLFIRI/Avastin 06/15/2021 Cycle 26 FOLFIRI/Avastin 07/13/2021 Cycle 27 FOLFIRI/Avastin 08/02/2021 CTs  08/30/2021-majority of liver lesions are unchanged in size, 1 lesion adjacent to the gallbladder has increased, no adenopathy, persistent pericolonic stranding and peritoneal thickening at the cecum Progressive rise in CEA 08/31/2021-treatment changed to Xeloda/bevacizumab, began [place] 09/22/2021 CEA stable 11/03/2021 Xeloda/bevacizumab continued CTs 01/10/2022-increase in size of some of the liver lesions, others are stable, new tiny right  lower lobe nodule, increased dilation of distal small bowel with increased wall thickening at the terminal ileum and ileocecal valve with 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, stable bilateral paracolic gutter peritoneal thickening Panitumumab/ Encorafenib 01/12/2022 (encorafenib 01/19/2022) Panitumumab 02/02/2022 Panitumumab 02/20/2022 Panitumumab 03/06/2022 Panitumumab 03/20/2022 Panitumumab 04/03/2022 CTs 04/14/2022-decrease size of liver metastases, new borderline subcarinal adenopathy, improved peritoneal thickening, cirrhosis or pseudocirrhosis, persistent dilated cecum Encorafenib and Panitumumab continued Panitumumab 04/18/2022 Panitumumab 05/05/2022 Panitumumab 05/19/2022 Panitumumab 06/02/2022 Panitumumab 06/16/2022, 4g IV mag and begin oral mag po once daily  Panitumumab 06/30/2022 Panitumumab 07/14/2022, 2 g IV magnesium, increase oral magnesium to 400 mg twice daily Panitumumab 07/28/2022, 2 g IV magnesium CTs 07/28/2022-no change in hepatic metastases, no new signs of metastatic disease, persistent mural thickening at the terminal ileum/cecum mild splenomegaly unchanged Panitumumab 08/18/2022 Panitumumab 09/01/2022 Panitumumab 09/15/2022 Panitumumab 09/29/2022 Panitumumab 10/13/2022 Panitumumab 10/27/2022 CTs 11/03/2022-unchanged hepatic metastases, no new lesions, 3.7 cm soft tissue density at the medial left kidney etiology unclear, improvement in wall thickening at the ileocecal junction development of appendiceal dilatation Panitumumab  11/10/2022 Panitumumab 11/24/2022 Panitumumab 12/08/2022 Panitumumab 12/22/2022 Panitumumab 01/05/2023 Panitumumab 02/02/2023 Panitumumab 02/16/2023 CTs 03/01/2023-unchanged cecal mass. Enlargement of left liver lesion, other liver lesions unchanged. Near complete resolution of soft tissue left renal pelvis. No pericardial effusion. Panitumumab 03/02/2023 Panitumumab 03/16/2023 Panitumumab 03/30/2023 Panitumumab 04/13/2023 Panitumumab 04/27/2023 Panitumumab 05/11/2023 CTs 05/19/2023-no evidence of metastatic disease in the chest, progressive porta hepatis adenopathy and enlargement of a coalescent left liver lesion, possible mildly enlarging left liver lesion, right colon mass less obvious-slightly improved? Panitumumab 05/25/2023, Encorafenib continued Panitumumab 06/08/2023 Panitumumab 06/26/2023 Panitumumab 07/06/2023 Panitumumab 07/20/2023 CTs 07/22/2023-new and enlarged liver lesions.  Continued enlargement of metastatic porta hepatis and portacaval lymph nodes.  Newly enlarged retroperitoneal lymph nodes. Change in treatment to Lonsurf/Avastin-anticipate start date Lonsurf 08/14/2023, discontinued 08/15/2022 Cycle 1 FOLFOX 09/04/2023 Cycle 2 FOLFOX/bevacizumab 09/19/2023-oxaliplatin stopped about 30 minutes into the infusion due to a reaction (pruritus, rash, generalized weakness, dyspnea). Cycle three 5-FU/bevacizumab 10/03/2023 Cirrhosis 12/26/2019-hospital admission for right lower extremity DVT and GI bleed, treated with heparin anticoagulation beginning 12/26/2019, converted to Lovenox at discharge 12/29/2019 Lovenox reduced to 40 mg daily 02/10/2021 secondary to bruising and nosebleeding History of low-grade fever-likely "tumor" fever COVID-19 infection January 2021 Admission 01/19/2023 with dyspnea/orthopnea, pericardial effusion, EKG changes suggestive of pericarditis Echocardiogram 01/20/2023-LVEF 60-65%, small to moderate circumferential pericardial effusion without tamponade physiology Echocardiogram  02/19/2022-LVEF 70-75%; trivial pericardial effusion. Anemia secondary to chemotherapy, chronic disease, and phlebotomy 2 units of packed red blood cells 02/03/2023      Disposition: Stacy Burns appears stable.  She has completed 3 cycles of systemic therapy.  She had a reaction to Oxaliplatin about 30 minutes into the infusion with cycle 2.  She was not rechallenged due to the severity of the reaction.  She completed a cycle of 5-FU/bevacizumab 2 weeks ago.  Performance status is declining.  There has been no improvement in pain.  She appears to have ascites on exam today.  We discussed a hospice referral versus trial of Lonsurf/Avastin.  She declines the hospice referral and would like to begin Lonsurf/Avastin.  She is uncomfortable from the abdominal distention.  We are referring her for a diagnostic/therapeutic paracentesis.  She will return for follow-up and cycle 1 Lonsurf/Avastin on 10/22/2023.  We are available to see her sooner if needed.  Patient seen with Dr. Truett Perna.  Lonna Cobb ANP/GNP-BC   10/17/2023  8:50 AM  This was a shared visit with Lonna Cobb.  Stacy Burns was interviewed  and examined.  Her clinical status has declined.  The CEA is higher.  I recommend discontinuing 5-FU/bevacizumab.  We discussed hospice care versus additional salvage systemic therapy.  She does not wish to enroll in hospice at present.  She will return for reassessment next week.  We will consider Lonsurf/bevacizumab if her performance status is adequate.  She will be referred for a palliative paracentesis.  I was present for greater than 50% of today's visit.  I performed medical decision making.  Mancel Bale, MD

## 2023-10-17 NOTE — Procedures (Signed)
 Ultrasound-guided diagnostic and therapeutic paracentesis performed yielding 2.1 liters of yellow  fluid. No immediate complications. A portion of the fluid was sent to the lab for preordered studies. EBL none.

## 2023-10-17 NOTE — Addendum Note (Signed)
 Addended by: Dimitri Ped on: 10/17/2023 09:37 AM   Modules accepted: Orders

## 2023-10-19 LAB — CYTOLOGY - NON PAP

## 2023-10-22 ENCOUNTER — Other Ambulatory Visit: Payer: Self-pay

## 2023-10-22 ENCOUNTER — Encounter: Payer: Self-pay | Admitting: Nurse Practitioner

## 2023-10-22 ENCOUNTER — Telehealth: Payer: Self-pay

## 2023-10-22 ENCOUNTER — Inpatient Hospital Stay

## 2023-10-22 ENCOUNTER — Telehealth: Payer: Self-pay | Admitting: Pharmacist

## 2023-10-22 ENCOUNTER — Other Ambulatory Visit: Payer: Self-pay | Admitting: *Deleted

## 2023-10-22 ENCOUNTER — Inpatient Hospital Stay (HOSPITAL_BASED_OUTPATIENT_CLINIC_OR_DEPARTMENT_OTHER): Admitting: Nurse Practitioner

## 2023-10-22 ENCOUNTER — Other Ambulatory Visit (HOSPITAL_COMMUNITY): Payer: Self-pay

## 2023-10-22 VITALS — BP 135/57 | HR 99

## 2023-10-22 VITALS — BP 115/72 | HR 100 | Temp 98.1°F | Resp 18 | Ht 67.0 in | Wt 129.0 lb

## 2023-10-22 DIAGNOSIS — C182 Malignant neoplasm of ascending colon: Secondary | ICD-10-CM

## 2023-10-22 DIAGNOSIS — C787 Secondary malignant neoplasm of liver and intrahepatic bile duct: Secondary | ICD-10-CM | POA: Diagnosis not present

## 2023-10-22 DIAGNOSIS — C189 Malignant neoplasm of colon, unspecified: Secondary | ICD-10-CM

## 2023-10-22 DIAGNOSIS — Z5112 Encounter for antineoplastic immunotherapy: Secondary | ICD-10-CM | POA: Diagnosis not present

## 2023-10-22 LAB — CMP (CANCER CENTER ONLY)
ALT: 9 U/L (ref 0–44)
AST: 36 U/L (ref 15–41)
Albumin: 3.2 g/dL — ABNORMAL LOW (ref 3.5–5.0)
Alkaline Phosphatase: 179 U/L — ABNORMAL HIGH (ref 38–126)
Anion gap: 10 (ref 5–15)
BUN: 19 mg/dL (ref 8–23)
CO2: 25 mmol/L (ref 22–32)
Calcium: 9.1 mg/dL (ref 8.9–10.3)
Chloride: 98 mmol/L (ref 98–111)
Creatinine: 0.84 mg/dL (ref 0.44–1.00)
GFR, Estimated: >60 mL/min (ref >60)
Glucose, Bld: 138 mg/dL — ABNORMAL HIGH (ref 70–99)
Potassium: 4.7 mmol/L (ref 3.5–5.1)
Sodium: 133 mmol/L — ABNORMAL LOW (ref 135–145)
Total Bilirubin: 1 mg/dL (ref 0.0–1.2)
Total Protein: 6.3 g/dL — ABNORMAL LOW (ref 6.5–8.1)

## 2023-10-22 LAB — CBC WITH DIFFERENTIAL (CANCER CENTER ONLY)
Abs Immature Granulocytes: 0.06 10*3/uL (ref 0.00–0.07)
Basophils Absolute: 0 10*3/uL (ref 0.0–0.1)
Basophils Relative: 0 %
Eosinophils Absolute: 0.1 10*3/uL (ref 0.0–0.5)
Eosinophils Relative: 1 %
HCT: 31.7 % — ABNORMAL LOW (ref 36.0–46.0)
Hemoglobin: 10 g/dL — ABNORMAL LOW (ref 12.0–15.0)
Immature Granulocytes: 1 %
Lymphocytes Relative: 4 %
Lymphs Abs: 0.5 10*3/uL — ABNORMAL LOW (ref 0.7–4.0)
MCH: 27.5 pg (ref 26.0–34.0)
MCHC: 31.5 g/dL (ref 30.0–36.0)
MCV: 87.3 fL (ref 80.0–100.0)
Monocytes Absolute: 1.5 10*3/uL — ABNORMAL HIGH (ref 0.1–1.0)
Monocytes Relative: 13 %
Neutro Abs: 9.5 10*3/uL — ABNORMAL HIGH (ref 1.7–7.7)
Neutrophils Relative %: 81 %
Platelet Count: 235 10*3/uL (ref 150–400)
RBC: 3.63 MIL/uL — ABNORMAL LOW (ref 3.87–5.11)
RDW: 23.1 % — ABNORMAL HIGH (ref 11.5–15.5)
WBC Count: 11.6 10*3/uL — ABNORMAL HIGH (ref 4.0–10.5)
nRBC: 0 % (ref 0.0–0.2)

## 2023-10-22 LAB — TOTAL PROTEIN, URINE DIPSTICK: Protein, ur: 100 mg/dL — AB

## 2023-10-22 LAB — SAMPLE TO BLOOD BANK

## 2023-10-22 MED ORDER — HEPARIN SOD (PORK) LOCK FLUSH 100 UNIT/ML IV SOLN
500.0000 [IU] | Freq: Once | INTRAVENOUS | Status: AC | PRN
  Administered 2023-10-22: 500 [IU]

## 2023-10-22 MED ORDER — OXYCODONE HCL 5 MG PO TABS
5.0000 mg | ORAL_TABLET | Freq: Once | ORAL | Status: AC
  Administered 2023-10-22: 5 mg via ORAL
  Filled 2023-10-22: qty 1

## 2023-10-22 MED ORDER — SODIUM CHLORIDE 0.9 % IV SOLN: INTRAVENOUS | Status: DC

## 2023-10-22 MED ORDER — SODIUM CHLORIDE 0.9% FLUSH
10.0000 mL | INTRAVENOUS | Status: DC | PRN
  Administered 2023-10-22: 10 mL

## 2023-10-22 MED ORDER — TRIFLURIDINE-TIPIRACIL 20-8.19 MG PO TABS: 40.0000 mg | ORAL_TABLET | Freq: Two times a day (BID) | ORAL | 0 refills | Status: DC

## 2023-10-22 MED ORDER — SODIUM CHLORIDE 0.9 % IV SOLN
5.0000 mg/kg | Freq: Once | INTRAVENOUS | Status: AC
  Administered 2023-10-22: 300 mg via INTRAVENOUS
  Filled 2023-10-22: qty 12

## 2023-10-22 MED ORDER — TRIFLURIDINE-TIPIRACIL 20-8.19 MG PO TABS: ORAL_TABLET | ORAL | 0 refills | Status: DC

## 2023-10-22 NOTE — Progress Notes (Signed)
 Patient seen by Lonna Cobb NP today  Vitals are within treatment parameters:Yes   Labs are within treatment parameters: Yes  Protein urine 100. Treatment plan has been signed: Yes   Per physician team, Patient is ready for treatment. Please note the following modifications: only Avastin and pain medication. The medication is in sign and held.

## 2023-10-22 NOTE — Patient Instructions (Signed)
 [facility]   Discharge Instructions: Thank you for choosing [facility] to provide your oncology and hematology care.   If you have a lab appointment with the Cancer Center, please go directly to the Cancer Center and check in at the registration area.   Wear comfortable clothing and clothing appropriate for easy access to any Portacath or PICC line.   We strive to give you quality time with your provider. You may need to reschedule your appointment if you arrive late (15 or more minutes).  Arriving late affects you and other patients whose appointments are after yours.  Also, if you miss three or more appointments without notifying the office, you may be dismissed from the clinic at the provider's discretion.      For prescription refill requests, have your pharmacy contact our office and allow 72 hours for refills to be completed.    Today you received the following chemotherapy and/or immunotherapy agents AVASTIN       To help prevent nausea and vomiting after your treatment, we encourage you to take your nausea medication as directed.  BELOW ARE SYMPTOMS THAT SHOULD BE REPORTED IMMEDIATELY: *FEVER GREATER THAN 100.4 F (38 C) OR HIGHER *CHILLS OR SWEATING *NAUSEA AND VOMITING THAT IS NOT CONTROLLED WITH YOUR NAUSEA MEDICATION *UNUSUAL SHORTNESS OF BREATH *UNUSUAL BRUISING OR BLEEDING *URINARY PROBLEMS (pain or burning when urinating, or frequent urination) *BOWEL PROBLEMS (unusual diarrhea, constipation, pain near the anus) TENDERNESS IN MOUTH AND THROAT WITH OR WITHOUT PRESENCE OF ULCERS (sore throat, sores in mouth, or a toothache) UNUSUAL RASH, SWELLING OR PAIN  UNUSUAL VAGINAL DISCHARGE OR ITCHING   Items with * indicate a potential emergency and should be followed up as soon as possible or go to the Emergency Department if any problems should occur.  Please show the CHEMOTHERAPY ALERT CARD or IMMUNOTHERAPY  ALERT CARD at check-in to the Emergency Department and triage nurse.  Should you have questions after your visit or need to cancel or reschedule your appointment, please contact [facility]  Dept: [phone]  and follow the prompts.  Office hours are 8:00 a.m. to 4:30 p.m. Monday - Friday. Please note that voicemails left after 4:00 p.m. may not be returned until the following business day.  We are closed weekends and major holidays. You have access to a nurse at all times for urgent questions. Please call the main number to the clinic Dept: [phone] and follow the prompts.   For any non-urgent questions, you may also contact your provider using MyChart. We now offer e-Visits for anyone 79 and older to request care online for non-urgent symptoms. For details visit [email or web address].   Also download the MyChart app! Go to the app store, search "MyChart", open the app, select [place], and log in with your MyChart username and password.  Bevacizumab Injection What is this medication? BEVACIZUMAB (be va SIZ yoo mab) treats some types of cancer. It works by blocking a protein that causes cancer cells to grow and multiply. This helps to slow or stop the spread of cancer cells. It is a monoclonal antibody. This medicine may be used for other purposes; ask your health care provider or pharmacist if you have questions. COMMON BRAND NAME(S): Alymsys, Avastin, MVASI, [place] What should I tell my care team before I take this medication? They need to know if you have any  of these conditions: Blood clots Coughing up blood Having or recent surgery Heart failure High blood pressure History of a connection between 2 or more body parts that do not usually connect (fistula) History of a tear in your stomach or intestines Protein in your urine An unusual or allergic reaction to bevacizumab, other medications, foods, dyes, or  preservatives Pregnant or trying to get pregnant Breast-feeding How should I use this medication? This medication is injected into a vein. It is given by your care team in a hospital or clinic setting. Talk to your care team the use of this medication in children. Special care may be needed. Overdosage: If you think you have taken too much of this medicine contact a poison control center or emergency room at once. NOTE: This medicine is only for you. Do not share this medicine with others. What if I miss a dose? Keep appointments for follow-up doses. It is important not to miss your dose. Call your care team if you are unable to keep an appointment. What may interact with this medication? Interactions are not expected. This list may not describe all possible interactions. Give your health care provider a list of all the medicines, herbs, non-prescription drugs, or dietary supplements you use. Also tell them if you smoke, drink alcohol, or use illegal drugs. Some items may interact with your medicine. What should I watch for while using this medication? Your condition will be monitored carefully while you are receiving this medication. You may need blood work while taking this medication. This medication may make you feel generally unwell. This is not uncommon as chemotherapy can affect healthy cells as well as cancer cells. Report any side effects. Continue your course of treatment even though you feel ill unless your care team tells you to stop. This medication may increase your risk to bruise or bleed. Call your care team if you notice any unusual bleeding. Before having surgery, talk to your care team to make sure it is ok. This medication can increase the risk of poor healing of your surgical site or wound. You will need to stop this medication for 28 days before surgery. After surgery, wait at least 28 days before restarting this medication. Make sure the surgical site or wound is healed enough  before restarting this medication. Talk to your care team if questions. Talk to your care team if you may be pregnant. Serious birth defects can occur if you take this medication during pregnancy and for 6 months after the last dose. Contraception is recommended while taking this medication and for 6 months after the last dose. Your care team can help you find the option that works for you. Do not breastfeed while taking this medication and for 6 months after the last dose. This medication can cause infertility. Talk to your care team if you are concerned about your fertility. What side effects may I notice from receiving this medication? Side effects that you should report to your care team as soon as possible: Allergic reactions--skin rash, itching, hives, swelling of the face, lips, tongue, or throat Bleeding--bloody or black, tar-like stools, vomiting blood or brown material that looks like coffee grounds, red or dark brown urine, small red or purple spots on skin, unusual bruising or bleeding Blood clot--pain, swelling, or warmth in the leg, shortness of breath, chest pain Heart attack--pain or tightness in the chest, shoulders, arms, or jaw, nausea, shortness of breath, cold or clammy skin, feeling faint or lightheaded Heart failure--shortness of breath,  swelling of the ankles, feet, or hands, sudden weight gain, unusual weakness or fatigue Increase in blood pressure Infection--fever, chills, cough, sore throat, wounds that don't heal, pain or trouble when passing urine, general feeling of discomfort or being unwell Infusion reactions--chest pain, shortness of breath or trouble breathing, feeling faint or lightheaded Kidney injury--decrease in the amount of urine, swelling of the ankles, hands, or feet Stomach pain that is severe, does not go away, or gets worse Stroke--sudden numbness or weakness of the face, arm, or leg, trouble speaking, confusion, trouble walking, loss of balance or  coordination, dizziness, severe headache, change in vision Sudden and severe headache, confusion, change in vision, seizures, which may be signs of posterior reversible encephalopathy syndrome (PRES) Side effects that usually do not require medical attention (report to your care team if they continue or are bothersome): Back pain Change in taste Diarrhea Dry skin Increased tears Nosebleed This list may not describe all possible side effects. Call your doctor for medical advice about side effects. You may report side effects to FDA at 1-800-FDA-1088. Where should I keep my medication? This medication is given in a hospital or clinic. It will not be stored at home. NOTE: This sheet is a summary. It may not cover all possible information. If you have questions about this medicine, talk to your doctor, pharmacist, or health care provider.  2024 Elsevier/Gold Standard (2021-12-16 00:00:00)

## 2023-10-22 NOTE — Progress Notes (Signed)
 [facility] OFFICE PROGRESS NOTE   Diagnosis: Colon cancer  INTERVAL HISTORY:   Stacy Burns returns as scheduled.  She is seen prior to proceeding with Lonsurf/Avastin.  She continues to have abdominal pain.  She is also having back pain.  She felt better following the recent paracentesis.  She is taking 1 oxycodone tablet every 4 hours.  Last bowel movement was a week ago.  She is taking MiraLAX once a day.  She had an episode of nausea/vomiting on the way to the office this morning.  No nausea or vomiting over the weekend.  Periodic chest pain.  She feels short of breath intermittently.  She denies bleeding.  Objective:  Vital signs in last 24 hours:  Blood pressure 115/72, pulse 100, temperature 98.1 F (36.7 C), temperature source Temporal, resp. rate 18, height 5\' 7"  (1.702 m), weight 129 lb (58.5 kg), SpO2 100%.    HEENT: No thrush or ulcers. Resp: Lungs clear bilaterally.  No respiratory distress. Cardio: Regular rate and rhythm. GI: Abdomen is distended consistent with ascites.  Liver palpable at the upper abdomen, associated tenderness. Vascular: No leg edema. Port-A-Cath without erythema.  Lab Results:  Lab Results  Component Value Date   WBC 11.6 (H) 10/22/2023   HGB 10.0 (L) 10/22/2023   HCT 31.7 (L) 10/22/2023   MCV 87.3 10/22/2023   PLT 235 10/22/2023   NEUTROABS 9.5 (H) 10/22/2023    Imaging:  No results found.  Medications: I have reviewed the patient's current medications.  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  Cirrhosis.  Innumerable liver lesions. Colonoscopy 12/18/2019-ulcerated partially obstructing large mass in the mid ascending colon.  Biopsy invasive adenocarcinoma; preserved expression major MMR proteins Upper endoscopy 12/18/2019-normal esophagus, stomach, examined duodenum CEA 12/19/2019-8,923 Biopsy liver  lesion 12/23/2019-adenocarcinoma Foundation 1-K-ras wild-type; tumor mutation burden greater than or equal to 10; microsatellite stable; BRAF V600E Cycle 1 FOLFOX 01/01/2020 Cycle 2 FOLFOX 01/15/2020  Cycle 3 FOLFOX 01/29/2020, Udenyca added Cycle 4 FOLFOX 02/12/2020, Udenyca held Cycle 5 FOLFOX 02/26/2020, Udenyca Cycle 6 FOLFOX 03/11/2020, Udenyca held CT abdomen/pelvis 03/16/2020-stable diffuse liver lesions, stable strictured appearing ascending colon, possible pericolonic implant, vague left lower lobe nodule Cycle 1 FOLFIRI/Avastin 04/06/2020 Cycle 2 FOLFIRI/Avastin 04/21/2020 Cycle 3 FOLFIRI/Avastin 05/06/2020 Cycle 4 FOLFIRI/Avastin 05/20/2020 Cycle 5 FOLFIRI/Avastin 06/03/2020 CTs 06/15/2020-mild to moderate improvement in hepatic metastasis.  No new or progressive disease.  Narrowing within the proximal ascending colon with upstream mild cecal and terminal ileum dilatation, similar. Cycle 6 FOLFIRI/Avastin 06/17/2020 Cycle 7 FOLFIRI/Avastin 07/01/2020 Cycle 8 FOLFIRI/Avastin 07/15/2020 Cycle 9 FOLFIRI/Avastin 07/29/2020-5-FU bolus eliminated, 5-FU infusion and irinotecan dose reduced Cycle 10 FOLFIRI/Avastin 08/12/2020 CTs 08/23/2020- mild residual wall thickening involving the cecum/ascending colon.  Numerous liver metastases improved. Cycle 11 FOLFIRI/Avastin 08/25/2020 Cycle 12 FOLFIRI/Avastin 09/16/2020 Cycle 13 FOLFIRI/Avastin 10/07/2020 Cycle 14 FOLFIRI/Avastin 10/28/2020 Cycle 15 FOLFIRI/Avastin 11/18/2020 CT abdomen/pelvis 12/06/2020-mild improvement in multifocal hepatic metastases, wall thickening at the ascending colon with pericolonic inflammatory changes Cycle 16 FOLFIRI/Avastin 12/09/2020 Cycle 17 FOLFIRI/Avastin 12/30/2020 Cycle 18 FOLFIRI/Avastin 01/20/2021 Cycle 19 FOLFIRI/Avastin 02/10/2021 Cycle 20 FOLFIRI/Avastin 03/03/2021 Cycle 21 FOLFIRI/Avastin 03/24/2021 Cycle 22 FOLFIRI/Avastin 04/14/2021 CTs 05/04/2021-slight improvement in hepatic metastatic disease. Cycle 23 FOLFIRI/Avastin  05/05/2021 Cycle 24 FOLFIRI/Avastin 05/25/2021 Cycle 25 FOLFIRI/Avastin 06/15/2021 Cycle 26 FOLFIRI/Avastin 07/13/2021 Cycle 27 FOLFIRI/Avastin 08/02/2021 CTs 08/30/2021-majority of liver lesions are unchanged in size, 1 lesion adjacent to the gallbladder has increased, no adenopathy, persistent pericolonic stranding and peritoneal  thickening at the cecum Progressive rise in CEA 08/31/2021-treatment changed to Xeloda/bevacizumab, began [place] 09/22/2021 CEA stable 11/03/2021 Xeloda/bevacizumab continued CTs 01/10/2022-increase in size of some of the liver lesions, others are stable, new tiny right lower lobe nodule, increased dilation of distal small bowel with increased wall thickening at the terminal ileum and ileocecal valve with 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, stable bilateral paracolic gutter peritoneal thickening Panitumumab/ Encorafenib 01/12/2022 (encorafenib 01/19/2022) Panitumumab 02/02/2022 Panitumumab 02/20/2022 Panitumumab 03/06/2022 Panitumumab 03/20/2022 Panitumumab 04/03/2022 CTs 04/14/2022-decrease size of liver metastases, new borderline subcarinal adenopathy, improved peritoneal thickening, cirrhosis or pseudocirrhosis, persistent dilated cecum Encorafenib and Panitumumab continued Panitumumab 04/18/2022 Panitumumab 05/05/2022 Panitumumab 05/19/2022 Panitumumab 06/02/2022 Panitumumab 06/16/2022, 4g IV mag and begin oral mag po once daily  Panitumumab 06/30/2022 Panitumumab 07/14/2022, 2 g IV magnesium, increase oral magnesium to 400 mg twice daily Panitumumab 07/28/2022, 2 g IV magnesium CTs 07/28/2022-no change in hepatic metastases, no new signs of metastatic disease, persistent mural thickening at the terminal ileum/cecum mild splenomegaly unchanged Panitumumab 08/18/2022 Panitumumab 09/01/2022 Panitumumab 09/15/2022 Panitumumab 09/29/2022 Panitumumab 10/13/2022 Panitumumab 10/27/2022 CTs 11/03/2022-unchanged hepatic metastases, no new lesions, 3.7 cm soft tissue density at  the medial left kidney etiology unclear, improvement in wall thickening at the ileocecal junction development of appendiceal dilatation Panitumumab 11/10/2022 Panitumumab 11/24/2022 Panitumumab 12/08/2022 Panitumumab 12/22/2022 Panitumumab 01/05/2023 Panitumumab 02/02/2023 Panitumumab 02/16/2023 CTs 03/01/2023-unchanged cecal mass. Enlargement of left liver lesion, other liver lesions unchanged. Near complete resolution of soft tissue left renal pelvis. No pericardial effusion. Panitumumab 03/02/2023 Panitumumab 03/16/2023 Panitumumab 03/30/2023 Panitumumab 04/13/2023 Panitumumab 04/27/2023 Panitumumab 05/11/2023 CTs 05/19/2023-no evidence of metastatic disease in the chest, progressive porta hepatis adenopathy and enlargement of a coalescent left liver lesion, possible mildly enlarging left liver lesion, right colon mass less obvious-slightly improved? Panitumumab 05/25/2023, Encorafenib continued Panitumumab 06/08/2023 Panitumumab 06/26/2023 Panitumumab 07/06/2023 Panitumumab 07/20/2023 CTs 07/22/2023-new and enlarged liver lesions.  Continued enlargement of metastatic porta hepatis and portacaval lymph nodes.  Newly enlarged retroperitoneal lymph nodes. Change in treatment to Lonsurf/Avastin-anticipate start date Lonsurf 08/14/2023, discontinued 08/15/2022 Cycle 1 FOLFOX 09/04/2023 Cycle 2 FOLFOX/bevacizumab 09/19/2023-oxaliplatin stopped about 30 minutes into the infusion due to a reaction (pruritus, rash, generalized weakness, dyspnea). Cycle three 5-FU/bevacizumab 10/03/2023 Treatment change due to no clinical improvement, allergic reaction to oxaliplatin Paracentesis 10/17/2018 25-2.1 L of fluid removed, cytology positive for malignant cells  cycle 1 Lonsurf/Avastin 10/22/2023 Cirrhosis 12/26/2019-hospital admission for right lower extremity DVT and GI bleed, treated with heparin anticoagulation beginning 12/26/2019, converted to Lovenox at discharge 12/29/2019 Lovenox reduced to 40 mg daily 02/10/2021  secondary to bruising and nosebleeding History of low-grade fever-likely "tumor" fever COVID-19 infection January 2021 Admission 01/19/2023 with dyspnea/orthopnea, pericardial effusion, EKG changes suggestive of pericarditis Echocardiogram 01/20/2023-LVEF 60-65%, small to moderate circumferential pericardial effusion without tamponade physiology Echocardiogram 02/19/2022-LVEF 70-75%; trivial pericardial effusion. Anemia secondary to chemotherapy, chronic disease, and phlebotomy 2 units of packed red blood cells 02/03/2023  Disposition: Stacy Burns has metastatic colon cancer.  Her clinical status appears unchanged.  She is scheduled to begin treatment today with Lonsurf/Avastin.  We again discussed supportive/comfort care with a hospice referral versus systemic therapy.  She would like to begin a trial of Lonsurf/Avastin.  CBC and chemistry panel reviewed.  Labs adequate to proceed as above.    She has recurrent ascites and would like to undergo another paracentesis for symptom relief.  We will make arrangements for a therapeutic paracentesis this week.  She will return for follow-up and Avastin in 2 weeks.  We are available to see her  sooner if needed.  Patient seen with Dr. Truett Perna.  Lonna Cobb ANP/GNP-BC   10/22/2023  8:24 AM  This was a shared visit with Lonna Cobb.  Stacy Burns was interviewed and examined.  She has metastatic colon cancer, now with malignant ascites.  There are limited systemic treatment options.  I recommend she consider hospice care.  She would like to proceed with a trial of Lonsurf/bevacizumab.  She will begin a cycle of treatment today.  We will refer her for a palliative paracentesis.  I was present for greater than 50% of today's visit.  I performed Medical Decision Making.  Mancel Bale, MD

## 2023-10-22 NOTE — Telephone Encounter (Signed)
 The patient is currently unable to void. She plans to obtain some sample at home and will arrange for them to be delivered later.

## 2023-10-23 ENCOUNTER — Other Ambulatory Visit (HOSPITAL_COMMUNITY)

## 2023-10-23 ENCOUNTER — Other Ambulatory Visit: Payer: Self-pay

## 2023-10-23 ENCOUNTER — Ambulatory Visit (HOSPITAL_COMMUNITY)
Admission: RE | Admit: 2023-10-23 | Discharge: 2023-10-23 | Disposition: A | Discharge status: Home / Self Care | Source: Ambulatory Visit | Attending: Nurse Practitioner | Admitting: Nurse Practitioner

## 2023-10-23 ENCOUNTER — Ambulatory Visit: Payer: BC Managed Care – PPO | Admitting: Cardiology

## 2023-10-23 DIAGNOSIS — R180 Malignant ascites: Secondary | ICD-10-CM | POA: Diagnosis not present

## 2023-10-23 DIAGNOSIS — C182 Malignant neoplasm of ascending colon: Secondary | ICD-10-CM | POA: Diagnosis present

## 2023-10-23 MED ORDER — LIDOCAINE HCL 1 % IJ SOLN
INTRAMUSCULAR | Status: AC
  Filled 2023-10-23: qty 20

## 2023-10-23 NOTE — Procedures (Signed)
Ultrasound-guided  therapeutic paracentesis performed yielding 3.1 liters of yellow fluid. No immediate complications. EBL none.   

## 2023-10-28 ENCOUNTER — Other Ambulatory Visit: Payer: Self-pay

## 2023-10-31 ENCOUNTER — Telehealth: Payer: Self-pay | Admitting: *Deleted

## 2023-10-31 NOTE — Telephone Encounter (Addendum)
 Grandson called reporting Nilda is staying with him just outside [place]. She has had cognitive changes w/hallucinations since Saturday, thinking it was due to her pain med she took last dose on 3/15 am with no improvement. Poor short-term memory and has gait disturbance/balance issue as well. He does note that she is jaundiced as well. Informed him it could be due to her liver and high bilirubin or in rare cases could be cancer spread to her brain as well.  After speaking w/NP, son was instructed to go to local ER now. He agrees.

## 2023-11-02 NOTE — Telephone Encounter (Signed)
 Stacy Burns

## 2023-11-03 ENCOUNTER — Other Ambulatory Visit: Payer: Self-pay | Admitting: Oncology

## 2023-11-03 ENCOUNTER — Encounter: Payer: Self-pay | Admitting: Oncology

## 2023-11-05 ENCOUNTER — Other Ambulatory Visit: Payer: Self-pay | Admitting: Oncology

## 2023-11-05 DIAGNOSIS — C182 Malignant neoplasm of ascending colon: Secondary | ICD-10-CM

## 2023-11-05 NOTE — Telephone Encounter (Signed)
 Office visit on 11/06/23

## 2023-11-06 ENCOUNTER — Inpatient Hospital Stay (HOSPITAL_COMMUNITY)
Admission: EM | Admit: 2023-11-06 | Discharge: 2023-11-13 | DRG: 374 | Disposition: E | Attending: Internal Medicine | Admitting: Internal Medicine

## 2023-11-06 ENCOUNTER — Inpatient Hospital Stay

## 2023-11-06 ENCOUNTER — Other Ambulatory Visit: Payer: Self-pay

## 2023-11-06 ENCOUNTER — Encounter (HOSPITAL_COMMUNITY): Payer: Self-pay

## 2023-11-06 ENCOUNTER — Emergency Department (HOSPITAL_COMMUNITY)

## 2023-11-06 ENCOUNTER — Inpatient Hospital Stay: Admitting: Oncology

## 2023-11-06 DIAGNOSIS — E43 Unspecified severe protein-calorie malnutrition: Secondary | ICD-10-CM | POA: Diagnosis present

## 2023-11-06 DIAGNOSIS — K625 Hemorrhage of anus and rectum: Secondary | ICD-10-CM | POA: Diagnosis not present

## 2023-11-06 DIAGNOSIS — Z79899 Other long term (current) drug therapy: Secondary | ICD-10-CM

## 2023-11-06 DIAGNOSIS — Z789 Other specified health status: Secondary | ICD-10-CM

## 2023-11-06 DIAGNOSIS — E8721 Acute metabolic acidosis: Secondary | ICD-10-CM | POA: Diagnosis present

## 2023-11-06 DIAGNOSIS — Z885 Allergy status to narcotic agent status: Secondary | ICD-10-CM

## 2023-11-06 DIAGNOSIS — C785 Secondary malignant neoplasm of large intestine and rectum: Principal | ICD-10-CM

## 2023-11-06 DIAGNOSIS — Z7189 Other specified counseling: Secondary | ICD-10-CM | POA: Diagnosis not present

## 2023-11-06 DIAGNOSIS — K920 Hematemesis: Secondary | ICD-10-CM | POA: Diagnosis present

## 2023-11-06 DIAGNOSIS — C189 Malignant neoplasm of colon, unspecified: Secondary | ICD-10-CM | POA: Diagnosis not present

## 2023-11-06 DIAGNOSIS — R4589 Other symptoms and signs involving emotional state: Secondary | ICD-10-CM | POA: Diagnosis not present

## 2023-11-06 DIAGNOSIS — K921 Melena: Secondary | ICD-10-CM | POA: Diagnosis not present

## 2023-11-06 DIAGNOSIS — K922 Gastrointestinal hemorrhage, unspecified: Secondary | ICD-10-CM | POA: Diagnosis not present

## 2023-11-06 DIAGNOSIS — Z66 Do not resuscitate: Secondary | ICD-10-CM | POA: Diagnosis present

## 2023-11-06 DIAGNOSIS — C182 Malignant neoplasm of ascending colon: Principal | ICD-10-CM | POA: Diagnosis present

## 2023-11-06 DIAGNOSIS — Z820 Family history of epilepsy and other diseases of the nervous system: Secondary | ICD-10-CM

## 2023-11-06 DIAGNOSIS — E875 Hyperkalemia: Secondary | ICD-10-CM | POA: Diagnosis present

## 2023-11-06 DIAGNOSIS — R64 Cachexia: Secondary | ICD-10-CM | POA: Diagnosis present

## 2023-11-06 DIAGNOSIS — Z515 Encounter for palliative care: Secondary | ICD-10-CM | POA: Diagnosis not present

## 2023-11-06 DIAGNOSIS — E876 Hypokalemia: Secondary | ICD-10-CM | POA: Diagnosis present

## 2023-11-06 DIAGNOSIS — R140 Abdominal distension (gaseous): Secondary | ICD-10-CM | POA: Diagnosis present

## 2023-11-06 DIAGNOSIS — Z711 Person with feared health complaint in whom no diagnosis is made: Secondary | ICD-10-CM

## 2023-11-06 DIAGNOSIS — F419 Anxiety disorder, unspecified: Secondary | ICD-10-CM | POA: Diagnosis present

## 2023-11-06 DIAGNOSIS — R54 Age-related physical debility: Secondary | ICD-10-CM | POA: Diagnosis present

## 2023-11-06 DIAGNOSIS — R180 Malignant ascites: Secondary | ICD-10-CM | POA: Diagnosis present

## 2023-11-06 DIAGNOSIS — Z86718 Personal history of other venous thrombosis and embolism: Secondary | ICD-10-CM | POA: Diagnosis not present

## 2023-11-06 DIAGNOSIS — Z833 Family history of diabetes mellitus: Secondary | ICD-10-CM

## 2023-11-06 DIAGNOSIS — Z91011 Allergy to milk products: Secondary | ICD-10-CM

## 2023-11-06 DIAGNOSIS — Z87440 Personal history of urinary (tract) infections: Secondary | ICD-10-CM

## 2023-11-06 DIAGNOSIS — E162 Hypoglycemia, unspecified: Secondary | ICD-10-CM | POA: Diagnosis present

## 2023-11-06 DIAGNOSIS — Z91018 Allergy to other foods: Secondary | ICD-10-CM

## 2023-11-06 DIAGNOSIS — C787 Secondary malignant neoplasm of liver and intrahepatic bile duct: Secondary | ICD-10-CM | POA: Diagnosis present

## 2023-11-06 DIAGNOSIS — I700 Atherosclerosis of aorta: Secondary | ICD-10-CM | POA: Diagnosis present

## 2023-11-06 DIAGNOSIS — R627 Adult failure to thrive: Secondary | ICD-10-CM | POA: Diagnosis present

## 2023-11-06 DIAGNOSIS — Z87891 Personal history of nicotine dependence: Secondary | ICD-10-CM | POA: Diagnosis not present

## 2023-11-06 DIAGNOSIS — C7800 Secondary malignant neoplasm of unspecified lung: Secondary | ICD-10-CM | POA: Diagnosis present

## 2023-11-06 DIAGNOSIS — R680 Hypothermia, not associated with low environmental temperature: Secondary | ICD-10-CM | POA: Diagnosis present

## 2023-11-06 DIAGNOSIS — Z882 Allergy status to sulfonamides status: Secondary | ICD-10-CM

## 2023-11-06 DIAGNOSIS — R7981 Abnormal blood-gas level: Secondary | ICD-10-CM | POA: Diagnosis present

## 2023-11-06 DIAGNOSIS — Z8616 Personal history of COVID-19: Secondary | ICD-10-CM | POA: Diagnosis not present

## 2023-11-06 DIAGNOSIS — G893 Neoplasm related pain (acute) (chronic): Secondary | ICD-10-CM | POA: Diagnosis present

## 2023-11-06 DIAGNOSIS — Z807 Family history of other malignant neoplasms of lymphoid, hematopoietic and related tissues: Secondary | ICD-10-CM

## 2023-11-06 DIAGNOSIS — J90 Pleural effusion, not elsewhere classified: Secondary | ICD-10-CM | POA: Diagnosis present

## 2023-11-06 DIAGNOSIS — Z6820 Body mass index (BMI) 20.0-20.9, adult: Secondary | ICD-10-CM

## 2023-11-06 DIAGNOSIS — N390 Urinary tract infection, site not specified: Secondary | ICD-10-CM | POA: Insufficient documentation

## 2023-11-06 DIAGNOSIS — N179 Acute kidney failure, unspecified: Secondary | ICD-10-CM | POA: Diagnosis present

## 2023-11-06 DIAGNOSIS — Z880 Allergy status to penicillin: Secondary | ICD-10-CM

## 2023-11-06 DIAGNOSIS — D62 Acute posthemorrhagic anemia: Secondary | ICD-10-CM | POA: Diagnosis present

## 2023-11-06 DIAGNOSIS — D72829 Elevated white blood cell count, unspecified: Secondary | ICD-10-CM | POA: Diagnosis present

## 2023-11-06 DIAGNOSIS — Z806 Family history of leukemia: Secondary | ICD-10-CM

## 2023-11-06 DIAGNOSIS — Z79631 Long term (current) use of antimetabolite agent: Secondary | ICD-10-CM

## 2023-11-06 DIAGNOSIS — Z9221 Personal history of antineoplastic chemotherapy: Secondary | ICD-10-CM

## 2023-11-06 DIAGNOSIS — R58 Hemorrhage, not elsewhere classified: Secondary | ICD-10-CM | POA: Diagnosis present

## 2023-11-06 LAB — CBC WITH DIFFERENTIAL/PLATELET
Abs Immature Granulocytes: 0.1 10*3/uL — ABNORMAL HIGH (ref 0.00–0.07)
Basophils Absolute: 0 10*3/uL (ref 0.0–0.1)
Basophils Relative: 0 %
Eosinophils Absolute: 0.1 10*3/uL (ref 0.0–0.5)
Eosinophils Relative: 1 %
HCT: 31.2 % — ABNORMAL LOW (ref 36.0–46.0)
Hemoglobin: 9.5 g/dL — ABNORMAL LOW (ref 12.0–15.0)
Immature Granulocytes: 1 %
Lymphocytes Relative: 4 %
Lymphs Abs: 0.6 10*3/uL — ABNORMAL LOW (ref 0.7–4.0)
MCH: 27 pg (ref 26.0–34.0)
MCHC: 30.4 g/dL (ref 30.0–36.0)
MCV: 88.6 fL (ref 80.0–100.0)
Monocytes Absolute: 1.4 10*3/uL — ABNORMAL HIGH (ref 0.1–1.0)
Monocytes Relative: 11 %
Neutro Abs: 10.8 10*3/uL — ABNORMAL HIGH (ref 1.7–7.7)
Neutrophils Relative %: 83 %
Platelets: 185 10*3/uL (ref 150–400)
RBC: 3.52 MIL/uL — ABNORMAL LOW (ref 3.87–5.11)
RDW: 21.5 % — ABNORMAL HIGH (ref 11.5–15.5)
Smear Review: NORMAL
WBC: 13 10*3/uL — ABNORMAL HIGH (ref 4.0–10.5)
nRBC: 0 % (ref 0.0–0.2)

## 2023-11-06 LAB — PHOSPHORUS: Phosphorus: 2.6 mg/dL (ref 2.5–4.6)

## 2023-11-06 LAB — COMPREHENSIVE METABOLIC PANEL
ALT: 15 U/L (ref 0–44)
AST: 44 U/L — ABNORMAL HIGH (ref 15–41)
Albumin: <1.5 g/dL — ABNORMAL LOW (ref 3.5–5.0)
Alkaline Phosphatase: 147 U/L — ABNORMAL HIGH (ref 38–126)
Anion gap: 6 (ref 5–15)
BUN: 61 mg/dL — ABNORMAL HIGH (ref 8–23)
CO2: 8 mmol/L — ABNORMAL LOW (ref 22–32)
Calcium: <4 mg/dL — CL (ref 8.9–10.3)
Chloride: 125 mmol/L — ABNORMAL HIGH (ref 98–111)
Creatinine, Ser: 2.73 mg/dL — ABNORMAL HIGH (ref 0.44–1.00)
GFR, Estimated: 17 mL/min — ABNORMAL LOW (ref >60)
Glucose, Bld: 48 mg/dL — ABNORMAL LOW (ref 70–99)
Potassium: 3 mmol/L — ABNORMAL LOW (ref 3.5–5.1)
Sodium: 139 mmol/L (ref 135–145)
Total Bilirubin: 0.7 mg/dL (ref 0.0–1.2)
Total Protein: <3 g/dL — ABNORMAL LOW (ref 6.5–8.1)

## 2023-11-06 LAB — PROTIME-INR
INR: 1.8 — ABNORMAL HIGH (ref 0.8–1.2)
Prothrombin Time: 21.4 s — ABNORMAL HIGH (ref 11.4–15.2)

## 2023-11-06 LAB — I-STAT CG4 LACTIC ACID, ED: Lactic Acid, Venous: 2.6 mmol/L (ref 0.5–1.9)

## 2023-11-06 LAB — TYPE AND SCREEN
ABO/RH(D): A POS
Antibody Screen: NEGATIVE

## 2023-11-06 LAB — CBG MONITORING, ED: Glucose-Capillary: 82 mg/dL (ref 70–99)

## 2023-11-06 LAB — MAGNESIUM: Magnesium: 1.2 mg/dL — ABNORMAL LOW (ref 1.7–2.4)

## 2023-11-06 LAB — APTT: aPTT: 37 s — ABNORMAL HIGH (ref 24–36)

## 2023-11-06 LAB — BETA-HYDROXYBUTYRIC ACID: Beta-Hydroxybutyric Acid: 0.3 mmol/L — ABNORMAL HIGH (ref 0.05–0.27)

## 2023-11-06 MED ORDER — POTASSIUM CHLORIDE IN NACL 20-0.9 MEQ/L-% IV SOLN
Freq: Once | INTRAVENOUS | Status: AC
  Filled 2023-11-06: qty 1000

## 2023-11-06 MED ORDER — CALCIUM GLUCONATE-NACL 1-0.675 GM/50ML-% IV SOLN
1.0000 g | Freq: Once | INTRAVENOUS | Status: AC
  Administered 2023-11-06: 1000 mg via INTRAVENOUS
  Filled 2023-11-06: qty 50

## 2023-11-06 MED ORDER — ALTEPLASE 2 MG IJ SOLR
2.0000 mg | Freq: Once | INTRAMUSCULAR | Status: AC
  Administered 2023-11-06: 2 mg
  Filled 2023-11-06: qty 2

## 2023-11-06 MED ORDER — MAGNESIUM SULFATE 2 GM/50ML IV SOLN
2.0000 g | Freq: Once | INTRAVENOUS | Status: AC
  Administered 2023-11-06: 2 g via INTRAVENOUS
  Filled 2023-11-06 (×2): qty 50

## 2023-11-06 MED ORDER — CHLORHEXIDINE GLUCONATE CLOTH 2 % EX PADS
6.0000 | MEDICATED_PAD | Freq: Every day | CUTANEOUS | Status: DC
  Administered 2023-11-07: 6 via TOPICAL

## 2023-11-06 MED ORDER — PANTOPRAZOLE SODIUM 40 MG IV SOLR
40.0000 mg | Freq: Two times a day (BID) | INTRAVENOUS | Status: DC
  Administered 2023-11-06 – 2023-11-07 (×2): 40 mg via INTRAVENOUS
  Filled 2023-11-06 (×2): qty 10

## 2023-11-06 MED ORDER — OXYCODONE HCL 5 MG PO TABS
5.0000 mg | ORAL_TABLET | ORAL | Status: DC | PRN
  Administered 2023-11-06 – 2023-11-07 (×2): 5 mg via ORAL
  Filled 2023-11-06 (×2): qty 1

## 2023-11-06 MED ORDER — ONDANSETRON HCL 4 MG/2ML IJ SOLN: 4.0000 mg | Freq: Four times a day (QID) | INTRAMUSCULAR | Status: DC | PRN

## 2023-11-06 MED ORDER — KCL IN DEXTROSE-NACL 40-5-0.9 MEQ/L-%-% IV SOLN
INTRAVENOUS | Status: DC
  Filled 2023-11-06: qty 1000

## 2023-11-06 MED ORDER — CIPROFLOXACIN IN D5W 200 MG/100ML IV SOLN
200.0000 mg | Freq: Once | INTRAVENOUS | Status: AC
  Administered 2023-11-06: 200 mg via INTRAVENOUS
  Filled 2023-11-06: qty 100

## 2023-11-06 MED ORDER — SODIUM CHLORIDE 0.9% FLUSH
10.0000 mL | Freq: Two times a day (BID) | INTRAVENOUS | Status: DC
  Administered 2023-11-06 – 2023-11-07 (×2): 10 mL

## 2023-11-06 MED ORDER — CIPROFLOXACIN IN D5W 200 MG/100ML IV SOLN
200.0000 mg | INTRAVENOUS | Status: DC
  Administered 2023-11-07: 200 mg via INTRAVENOUS
  Filled 2023-11-06: qty 100

## 2023-11-06 MED ORDER — ACETAMINOPHEN 500 MG PO TABS
500.0000 mg | ORAL_TABLET | Freq: Four times a day (QID) | ORAL | Status: DC | PRN
  Administered 2023-11-06 – 2023-11-07 (×2): 500 mg via ORAL
  Filled 2023-11-06 (×2): qty 1

## 2023-11-06 MED ORDER — SODIUM CHLORIDE 0.9% FLUSH: 10.0000 mL | INTRAVENOUS | Status: DC | PRN

## 2023-11-06 MED ORDER — TRANEXAMIC ACID-NACL 1000-0.7 MG/100ML-% IV SOLN
1000.0000 mg | INTRAVENOUS | Status: AC
  Administered 2023-11-06: 1000 mg via INTRAVENOUS
  Filled 2023-11-06: qty 100

## 2023-11-06 MED ORDER — ACETAMINOPHEN 650 MG RE SUPP: 325.0000 mg | RECTAL | Status: DC | PRN

## 2023-11-06 MED ORDER — ONDANSETRON HCL 4 MG PO TABS: 4.0000 mg | ORAL_TABLET | Freq: Four times a day (QID) | ORAL | Status: DC | PRN

## 2023-11-06 MED ORDER — SODIUM CHLORIDE 0.9 % IV SOLN
50.0000 ug/h | INTRAVENOUS | Status: DC
  Administered 2023-11-06 – 2023-11-07 (×3): 50 ug/h via INTRAVENOUS
  Filled 2023-11-06 (×5): qty 1

## 2023-11-06 MED ORDER — DEXTROSE 50 % IV SOLN: 25.0000 g | INTRAVENOUS | Status: DC | PRN

## 2023-11-06 MED ORDER — PANTOPRAZOLE SODIUM 40 MG IV SOLR
40.0000 mg | Freq: Once | INTRAVENOUS | Status: AC
  Administered 2023-11-06: 40 mg via INTRAVENOUS
  Filled 2023-11-06: qty 10

## 2023-11-06 MED ORDER — SODIUM CHLORIDE (PF) 0.9 % IJ SOLN
INTRAMUSCULAR | Status: AC
  Filled 2023-11-06: qty 50

## 2023-11-06 MED ORDER — MAGNESIUM SULFATE 2 GM/50ML IV SOLN: 2.0000 g | Freq: Once | INTRAVENOUS | Status: DC

## 2023-11-06 MED ORDER — OCTREOTIDE LOAD VIA INFUSION
50.0000 ug | Freq: Once | INTRAVENOUS | Status: AC
  Administered 2023-11-06: 50 ug via INTRAVENOUS
  Filled 2023-11-06: qty 25

## 2023-11-06 MED ORDER — SENNA 8.6 MG PO TABS
1.0000 | ORAL_TABLET | Freq: Two times a day (BID) | ORAL | Status: DC
Start: 2023-11-06 — End: 2023-11-06

## 2023-11-06 MED ORDER — ONDANSETRON HCL 4 MG/2ML IJ SOLN
4.0000 mg | Freq: Once | INTRAMUSCULAR | Status: AC
  Administered 2023-11-06: 4 mg via INTRAVENOUS
  Filled 2023-11-06: qty 2

## 2023-11-06 NOTE — ED Triage Notes (Signed)
 Pt BIB EMS from home for possible GI bleed. Pt has black tarry stool and coughing up bright red blood. Pt is transitioning to hospice.  Hx colon cancer   88/44 124 cbg 82 hr 88% RA

## 2023-11-06 NOTE — ED Provider Notes (Signed)
 [place] EMERGENCY DEPARTMENT AT [facility] Provider Note   CSN: [id number] Arrival date & time: 11/06/23  2130     History  Chief Complaint  Patient presents with   GI Problem    Stacy Burns is a 78 y.o. female.  With a history of metastatic colon cancer and GI bleeding who presents to the ED given concern for GI bleeding.  Patient reports dark tarry stools beginning yesterday and hemoptysis this morning.  She is end-stage metastatic colon cancer and recently had a discussion with her oncologist (Dr.Sherrill) about discontinuing treatment and transitioning to hospice care.  She denies fevers and shortness of breath.  Patient is prescribed Lovenox but has not had this medication in the last week per daughter at bedside.  At this time patient states she is DNR/DNI but is amenable to blood transfusion if needed   GI Problem       Home Medications Prior to Admission medications   Medication Sig Start Date End Date Taking? Authorizing Provider  albuterol (VENTOLIN HFA) 108 (90 Base) MCG/ACT inhaler INHALE 2 PUFFS INTO THE LUNGS FOUR TIMES DAILY AS NEEDED Patient not taking: Reported on 10/22/2023 07/09/23   Ladene Artist, MD  Cholecalciferol (VITAMIN D3) 25 MCG (1000 UT) CAPS Take 1,000 Units by mouth daily.    [provider]  cyanocobalamin (VITAMIN B12) 1000 MCG tablet Take 1,000 mcg by mouth daily.    [provider]  enoxaparin (LOVENOX) 40 MG/0.4ML injection Inject 0.4 mLs (40 mg total) into the skin daily. 08/03/23   Rana Snare, NP  FEROSUL 325 (65 Fe) MG tablet TAKE 1 TABLET BY MOUTH DAILY WITH BREAKFAST 07/09/23   Ladene Artist, MD  lidocaine-prilocaine (EMLA) cream Apply 1 Application topically as needed. 01/19/23   Rana Snare, NP  magnesium oxide (MAG-OX) 400 (240 Mg) MG tablet TAKE 1 TABLET(400 MG) BY MOUTH TWICE DAILY 08/23/23   Ladene Artist, MD  oxyCODONE (OXY IR/ROXICODONE) 5 MG immediate release tablet Take 1-2 tablets  (5-10 mg total) by mouth every 4 (four) hours as needed for severe pain (pain score 7-10). 10/15/23   Rana Snare, NP  pantoprazole (PROTONIX) 40 MG tablet Take 1 tablet (40 mg total) by mouth daily. Patient not taking: Reported on 10/22/2023 09/19/23   Ladene Artist, MD  potassium chloride (KLOR-CON) 10 MEQ tablet Take 10 mEq by mouth 2 (two) times daily.    [provider]  rosuvastatin (CRESTOR) 5 MG tablet TAKE 1 TABLET(5 MG) BY MOUTH AT BEDTIME Patient not taking: Reported on 10/22/2023 04/11/23   Tolia, Sunit, DO  trifluridine-tipiracil (LONSURF) 20-8.19 MG tablet Take 2 tablets (40 mg of trifluridine total) by mouth 2 (two) times daily after a meal. Take within 1 hr after AM & PM meals on days 1-5, 8-12. Repeat every 28 days. 10/22/23   Ladene Artist, MD      Allergies    Milk-related compounds, Penicillins, Pineapple, Morphine sulfate, Oxaliplatin, Chocolate, Hydrocodone, Lipitor [atorvastatin], Other, and Sulfa antibiotics    Review of Systems   Review of Systems  Physical Exam Updated Vital Signs BP (!) 112/45 (BP Location: Left Arm)   Pulse 68   Temp (!) 96.1 F (35.6 C) (Axillary)   Resp 14   SpO2 100%  Physical Exam Vitals and nursing note reviewed.  HENT:     Head: Normocephalic and atraumatic.  Eyes:     Pupils: Pupils are equal, round, and reactive to light.  Cardiovascular:  Rate and Rhythm: Normal rate and regular rhythm.  Pulmonary:     Effort: Pulmonary effort is normal.     Breath sounds: Normal breath sounds.  Abdominal:     General: There is distension.     Palpations: Abdomen is soft.     Tenderness: There is no abdominal tenderness.  Skin:    General: Skin is warm and dry.  Neurological:     Mental Status: She is alert.  Psychiatric:        Mood and Affect: Mood normal.     ED Results / Procedures / Treatments   Labs (all labs ordered are listed, but only abnormal results are displayed) Labs Reviewed  CBC WITH  DIFFERENTIAL/PLATELET - Abnormal; Notable for the following components:      Result Value   WBC 13.0 (*)    RBC 3.52 (*)    Hemoglobin 9.5 (*)    HCT 31.2 (*)    RDW 21.5 (*)    Neutro Abs 10.8 (*)    Lymphs Abs 0.6 (*)    Monocytes Absolute 1.4 (*)    Abs Immature Granulocytes 0.10 (*)    All other components within normal limits  COMPREHENSIVE METABOLIC PANEL - Abnormal; Notable for the following components:   Potassium 3.0 (*)    Chloride 125 (*)    CO2 8 (*)    Glucose, Bld 48 (*)    BUN 61 (*)    Creatinine, Ser 2.73 (*)    Calcium <4.0 (*)    Total Protein <3.0 (*)    Albumin <1.5 (*)    AST 44 (*)    Alkaline Phosphatase 147 (*)    GFR, Estimated 17 (*)    All other components within normal limits  I-STAT CG4 LACTIC ACID, ED - Abnormal; Notable for the following components:   Lactic Acid, Venous 2.6 (*)    All other components within normal limits  CULTURE, BLOOD (ROUTINE X 2)  CULTURE, BLOOD (ROUTINE X 2)  FACTOR 10 ASSAY  MAGNESIUM  PHOSPHORUS  TYPE AND SCREEN    EKG EKG Interpretation Date/Time:  Tuesday November 06 2023 10:12:34 EDT Ventricular Rate:  71 PR Interval:  206 QRS Duration:  184 QT Interval:  504 QTC Calculation: 548 R Axis:   -87  Text Interpretation: Sinus rhythm RBBB and LAFB Confirmed by Estelle June [phone]) on 11/06/2023 12:40:29 PM  Radiology CT CHEST ABDOMEN PELVIS WO CONTRAST Result Date: 11/06/2023 CLINICAL DATA:  ?hemoptysis. H/o metastatic colon CA. * Tracking Code: BO * EXAM: CT CHEST, ABDOMEN AND PELVIS WITHOUT CONTRAST TECHNIQUE: Multidetector CT imaging of the chest, abdomen and pelvis was performed following the standard protocol without IV contrast. RADIATION DOSE REDUCTION: This exam was performed according to the departmental dose-optimization program which includes automated exposure control, adjustment of the mA and/or kV according to patient size and/or use of iterative reconstruction technique. COMPARISON:  CT scan chest,  abdomen and pelvis from 07/22/2023. FINDINGS: CT CHEST FINDINGS Cardiovascular: Normal cardiac size. No pericardial effusion. No aortic aneurysm. There are coronary artery calcifications, in keeping with coronary artery disease. There are also moderate to severe peripheral atherosclerotic vascular calcifications of thoracic aorta and its major branches. There is apparent hypoattenuation of the blood pool relative to the myocardium, suggestive of anemia. Mediastinum/Nodes: Visualized thyroid gland appears grossly unremarkable. No solid / cystic mediastinal masses. The esophagus is nondistended precluding optimal assessment. There is moderate circumferential thickening of the lower thoracic esophagus, which is most likely seen in the settings of  chronic gastroesophageal reflux disease versus esophagitis. No mediastinal or axillary lymphadenopathy by size criteria. Evaluation of bilateral hila is limited due to lack on intravenous contrast: however, no large hilar lymphadenopathy identified. Lungs/Pleura: The central tracheo-bronchial tree is patent. Since the prior study, there are innumerable new, solid noncalcified nodules throughout bilateral lungs with largest in the left lung apex measuring up to 8 x 10 mm and largest in the right lung apex measuring up to 6 x 7 mm, compatible with worsening metastases. There is also trace right pleural effusion with associated compressive atelectatic changes in the right lung lower lobe. No left pleural effusion. No consolidation or pneumothorax on either side. Musculoskeletal: A CT Port-a-Cath is seen in the right upper chest wall with the catheter terminating in the cavo-atrial junction region. Visualized soft tissues of the chest wall are otherwise grossly unremarkable. No suspicious osseous lesions. There are mild multilevel degenerative changes in the visualized spine. CT ABDOMEN PELVIS FINDINGS Hepatobiliary: The liver is normal in size. There are innumerable heterogeneous  hypoattenuating lesions throughout the liver occupying more than 50% of the total liver parenchyma, compatible with worsening hepatic metastases. 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 No intrahepatic or extrahepatic bile duct dilation. There are hyperattenuating areas within the gallbladder, which may represent gallbladder sludge. No imaging evidence of acute cholecystitis. Pancreas: No discrete focal pancreatic lesions seen within the limitations of this unenhanced exam. No peripancreatic fat stranding. Spleen: Within normal limits. No focal lesion. Adrenals/Urinary Tract: Adrenal glands are unremarkable. No suspicious renal mass within the limitations of this unenhanced exam. No hydroureteronephrosis or nephroureterolithiasis. Urinary bladder is under distended, precluding optimal assessment. However, no large mass or stones identified. No perivesical fat stranding. Stomach/Bowel: No disproportionate dilation of the small or large bowel loops. Redemonstration of heterogeneous hyperattenuating mass centered in the cecum. Vascular/Lymphatic: There is large ascites, significantly increased since the prior study. There are irregular soft tissue nodules throughout the abdomen, which may be due to ascites or due to peritoneal carcinomatosis. No pneumoperitoneum. Redemonstration of multiple prominent para-aortic lymph nodes, grossly similar to the prior study. No aneurysmal dilation of the major abdominal arteries. There are moderate peripheral atherosclerotic vascular calcifications of the aorta and its major branches. Reproductive: The uterus is unremarkable. No large adnexal mass. Other: The visualized soft tissues and abdominal wall are unremarkable. Musculoskeletal: No suspicious osseous lesions. There are mild - moderate multilevel degenerative changes in the visualized spine. IMPRESSION: 1. Since the prior study, there is interval worsening of metastatic disease with innumerable new  pulmonary nodules and innumerable hepatic lesions. There is also interval increase in the ascites. 2. There is new, trace right pleural effusion with associated compressive atelectatic changes in the right lung lower lobe. 3. Multiple other nonacute observations, as described above. Aortic Atherosclerosis (ICD10-I70.0). Electronically Signed   By: Jules Schick M.D.   On: 11/06/2023 11:33    Procedures .Critical Care  Performed by: Royanne Foots, DO Authorized by: Royanne Foots, DO   Critical care provider statement:    Critical care time (minutes):  30   Critical care was necessary to treat or prevent imminent or life-threatening deterioration of the following conditions:  Circulatory failure   Critical care was time spent personally by me on the following activities:  Development of treatment plan with patient or surrogate, discussions with consultants, evaluation of patient's response to treatment, examination of patient, ordering and review of laboratory studies, ordering and review of radiographic studies, ordering and performing treatments and  interventions, pulse oximetry, re-evaluation of patient's condition and review of old charts   I assumed direction of critical care for this patient from another provider in my specialty: no     Care discussed with: admitting provider   Comments:     Discussed GI bleeding with GI, oncology and hospitalist services     Medications Ordered in ED Medications  octreotide (SANDOSTATIN) 2 mcg/mL load via infusion 50 mcg (50 mcg Intravenous Bolus from Bag 11/06/23 1019)    And  octreotide (SANDOSTATIN) 500 mcg in sodium chloride 0.9 % 250 mL (2 mcg/mL) infusion (50 mcg/hr Intravenous New Bag/Given 11/06/23 1014)  pantoprazole (PROTONIX) injection 40 mg (40 mg Intravenous Given 11/06/23 0739)  tranexamic acid (CYKLOKAPRON) IVPB 1,000 mg (0 mg Intravenous Stopped 11/06/23 0804)  ondansetron (ZOFRAN) injection 4 mg (4 mg Intravenous Given 11/06/23 0739)   ciprofloxacin (CIPRO) IVPB 200 mg (0 mg Intravenous Stopped 11/06/23 0944)  calcium gluconate 1 g/ 50 mL sodium chloride IVPB (0 mg Intravenous Stopped 11/06/23 1127)  0.9 % NaCl with KCl 20 mEq/ L  infusion ( Intravenous New Bag/Given 11/06/23 1139)    ED Course/ Medical Decision Making/ A&P Clinical Course as of 11/06/23 1240  Tue Nov 06, 2023  1236 Hemoglobin of 9.5.  Labs also notable for AKI, hypocalcemia and hypokalemia.  BUN of 61 consistent with known GI bleeding.  CT shows no acute findings but does show progression of known metastatic disease.  Blood pressure has remained low but stable.  No need for emergent transfusion at this time but type and screen has been completed  I have messaged GI PA on-call for [facility] to make them aware of plan for admission and potential need for endoscopy for GI bleeding but reiterated that this is an ongoing discussion based on the patient and her family's wishes  Discussed with admitting hospitalist who accept patient for admission.  He is also placed palliative care consult  I messaged Dr.Sherrill to review the findings of her workup here and plan for admission.  He will see her in the morning [MP]    Clinical Course User Index [MP] Royanne Foots, DO                                 Medical Decision Making 78 year old female with history as above presenting given concern for GI bleeding x 2 days.  Hypotensive on arrival.  Hypothermic based on oral temperature.  Abdominal distention.  Patient is awake alert and oriented but is somewhat confused.  She is DNR/DNI but amenable to blood transfusion and perhaps interventional procedure given concern for active GI bleeding.  We will obtain laboratory workup including type and screen along with CTA chest abdomen pelvis to look for active bleeding and reach out to her oncology team.  Suspect most likely etiology of reported GI bleeding would be known metastatic lesions.  Will provide prophylaxis with  ciprofloxacin (documented penicillin allergy) and administer Protonix, octreotide bolus and drip and IV fluids.  No need for Lovenox reversal with protamine given that she has not taken this medication last week.  Will give TXA and send factor Xa assay  Amount and/or Complexity of Data Reviewed Labs: ordered. Radiology: ordered.  Risk Prescription drug management. Decision regarding hospitalization.           Final Clinical Impression(s) / ED Diagnoses Final diagnoses:  Carcinoma metastatic to colon [facility])  Gastrointestinal hemorrhage, unspecified gastrointestinal hemorrhage type  Rx / DC Orders ED Discharge Orders     None         Royanne Foots, DO 11/06/23 1240

## 2023-11-06 NOTE — ED Notes (Signed)
 ED TO INPATIENT HANDOFF REPORT  Name/Age/Gender Stacy Burns 78 y.o. female  Code Status    Code Status Orders  (From admission, onward)           Start     Ordered   11/06/23 1306  Do not attempt resuscitation (DNR)- Limited -Do Not Intubate (DNI)  Continuous       Question Answer Comment  If pulseless and not breathing No CPR or chest compressions.   In Pre-Arrest Conditions (Patient Is Breathing and Has A Pulse) Do not intubate. Provide all appropriate non-invasive medical interventions. Avoid ICU transfer unless indicated or required.   Consent: Discussion documented in EHR or advanced directives reviewed      11/06/23 1305           Code Status History     Date Active Date Inactive Code Status Order ID Comments User Context   01/19/2023 2238 01/22/2023 2117 DNR [id number]  Hillary Bow, DO ED   12/26/2019 1908 12/29/2019 2051 Full Code [id number]  Leatha Gilding, MD ED   12/15/2019 2124 12/20/2019 1721 Full Code [id number]  Bobette Mo, MD ED       Home/SNF/Other Home  Chief Complaint Melena [K92.1]  Level of Care/Admitting Diagnosis ED Disposition     ED Disposition  Admit   Condition  --   Comment  Hospital Area: [facility] [[id number]]  Level of Care: Progressive [102]  Admit to Progressive based on following criteria: MULTISYSTEM THREATS such as stable sepsis, metabolic/electrolyte imbalance with or without encephalopathy that is responding to early treatment.  Admit to Progressive based on following criteria: GI, ENDOCRINE disease patients with GI bleeding, acute liver failure or pancreatitis, stable with diabetic ketoacidosis or thyrotoxicosis (hypothyroid) state.  May admit patient to [facility] or [facility] if equivalent level of care is available:: No  Covid Evaluation: Asymptomatic - no recent exposure (last 10 days) testing not required  Diagnosis: Melena [[id number]]  Admitting Physician: Bobette Mo [[id number]]   Attending Physician: Bobette Mo [[id number]]  Certification:: I certify this patient will need inpatient services for at least 2 midnights  Expected Medical Readiness: 11/08/2023          Medical History Past Medical History:  Diagnosis Date   colon ca dx'd 12/2019   COVID-19 virus infection 08/2019   not hospitalized.     Severe anemia 12/17/2019    Allergies Allergies  Allergen Reactions   Milk-Related Compounds Nausea Only and Other (See Comments)    Delayed reaction if too much consumed    Penicillins Anaphylaxis and Other (See Comments)    Both parents had anaphylactic reactions to this   Pineapple Swelling and Other (See Comments)    Tongue swells and causes acid bumps there, also   Morphine Sulfate Other (See Comments)    Patient received IV morphine on 08/17/23. Patient had troubles speaking clearly (word salad). See progress note from 08/17/23   Oxaliplatin Nausea And Vomiting, Rash and Other (See Comments)    Pt noted to be flushed on face, arms, chest and hands and felt very warm.Rash noted on arms and palms of hands.   Chocolate Other (See Comments)    Nasal congestion   Hydrocodone Nausea And Vomiting   Lipitor [Atorvastatin] Other (See Comments)    Bilateral shoulder aches.   Other Itching, Rash and Other (See Comments)    "Most all antibiotics and OTC cold meds break me out" Allergic to ALL NUTS, also = Itching  Sulfa Antibiotics Rash and Other (See Comments)    Severe rash    IV Location/Drains/Wounds Patient Lines/Drains/Airways Status     Active Line/Drains/Airways     Name Placement date Placement time Site Days   Implanted Port 12/19/19 Right Chest 12/19/19  1311  Chest  1418            Labs/Imaging Results for orders placed or performed during the hospital encounter of 11/06/23 (from the past 48 hours)  CBC with Differential     Status: Abnormal   Collection Time: 11/06/23  7:25 AM  Result Value Ref Range   WBC 13.0 (H) 4.0 - 10.5  K/uL   RBC 3.52 (L) 3.87 - 5.11 MIL/uL   Hemoglobin 9.5 (L) 12.0 - 15.0 g/dL   HCT 16.1 (L) 09.6 - 04.5 %   MCV 88.6 80.0 - 100.0 fL   MCH 27.0 26.0 - 34.0 pg   MCHC 30.4 30.0 - 36.0 g/dL   RDW 40.9 (H) 81.1 - 91.4 %   Platelets 185 150 - 400 K/uL   nRBC 0.0 0.0 - 0.2 %   Neutrophils Relative % 83 %   Neutro Abs 10.8 (H) 1.7 - 7.7 K/uL   Lymphocytes Relative 4 %   Lymphs Abs 0.6 (L) 0.7 - 4.0 K/uL   Monocytes Relative 11 %   Monocytes Absolute 1.4 (H) 0.1 - 1.0 K/uL   Eosinophils Relative 1 %   Eosinophils Absolute 0.1 0.0 - 0.5 K/uL   Basophils Relative 0 %   Basophils Absolute 0.0 0.0 - 0.1 K/uL   Smear Review Normal platelet morphology    Immature Granulocytes 1 %   Abs Immature Granulocytes 0.10 (H) 0.00 - 0.07 K/uL   Ovalocytes PRESENT     Comment: Performed at [facility], [address], [place], [place] [address]  Comprehensive metabolic panel     Status: Abnormal   Collection Time: 11/06/23  7:25 AM  Result Value Ref Range   Sodium 139 135 - 145 mmol/L   Potassium 3.0 (L) 3.5 - 5.1 mmol/L   Chloride 125 (H) 98 - 111 mmol/L   CO2 8 (L) 22 - 32 mmol/L   Glucose, Bld 48 (L) 70 - 99 mg/dL    Comment: Glucose reference range applies only to samples taken after fasting for at least 8 hours.   BUN 61 (H) 8 - 23 mg/dL   Creatinine, Ser 6.21 (H) 0.44 - 1.00 mg/dL   Calcium <3.0 (LL) 8.9 - 10.3 mg/dL    Comment: CRITICAL RESULT CALLED TO, READ BACK BY AND VERIFIED WITH SINCLAIR,L. RN AT 8657 11/06/23 MULLINS,T REPEATED TO VERIFY    Total Protein <3.0 (L) 6.5 - 8.1 g/dL   Albumin <8.4 (L) 3.5 - 5.0 g/dL   AST 44 (H) 15 - 41 U/L   ALT 15 0 - 44 U/L   Alkaline Phosphatase 147 (H) 38 - 126 U/L   Total Bilirubin 0.7 0.0 - 1.2 mg/dL   GFR, Estimated 17 (L) >60 mL/min    Comment: (NOTE) Calculated using the CKD-EPI Creatinine Equation (2021)    Anion gap 6 5 - 15    Comment: Performed at [facility], [address] [place],  [place] [address]  Type and screen [facility]     Status: None   Collection Time: 11/06/23  7:25 AM  Result Value Ref Range   ABO/RH(D) A POS    Antibody Screen NEG    Sample Expiration  11/09/2023,2359 Performed at [facility], [address] [place], [place] [address]   Magnesium     Status: Abnormal   Collection Time: 11/06/23  7:25 AM  Result Value Ref Range   Magnesium 1.2 (L) 1.7 - 2.4 mg/dL    Comment: Performed at [facility], [address] [place], [place] [address]  Phosphorus     Status: None   Collection Time: 11/06/23  7:25 AM  Result Value Ref Range   Phosphorus 2.6 2.5 - 4.6 mg/dL    Comment: Performed at [facility], [address] [place], [place] [address]  Beta-hydroxybutyric acid     Status: Abnormal   Collection Time: 11/06/23  7:25 AM  Result Value Ref Range   Beta-Hydroxybutyric Acid 0.30 (H) 0.05 - 0.27 mmol/L    Comment: Performed at [facility], [address], [place], [place] [address]  I-Stat Lactic Acid     Status: Abnormal   Collection Time: 11/06/23  7:36 AM  Result Value Ref Range   Lactic Acid, Venous 2.6 (HH) 0.5 - 1.9 mmol/L   Comment NOTIFIED PHYSICIAN   CBG monitoring, ED     Status: None   Collection Time: 11/06/23  1:20 PM  Result Value Ref Range   Glucose-Capillary 82 70 - 99 mg/dL    Comment: Glucose reference range applies only to samples taken after fasting for at least 8 hours.   *Note: Due to a large number of results and/or encounters for the requested time period, some results have not been displayed. A complete set of results can be found in Results Review.   CT CHEST ABDOMEN PELVIS WO CONTRAST Result Date: 11/06/2023 CLINICAL DATA:  ?hemoptysis. H/o metastatic colon CA. * Tracking Code: BO * EXAM: CT CHEST, ABDOMEN AND PELVIS WITHOUT CONTRAST TECHNIQUE: Multidetector CT imaging of the chest, abdomen and pelvis was performed  following the standard protocol without IV contrast. RADIATION DOSE REDUCTION: This exam was performed according to the departmental dose-optimization program which includes automated exposure control, adjustment of the mA and/or kV according to patient size and/or use of iterative reconstruction technique. COMPARISON:  CT scan chest, abdomen and pelvis from 07/22/2023. FINDINGS: CT CHEST FINDINGS Cardiovascular: Normal cardiac size. No pericardial effusion. No aortic aneurysm. There are coronary artery calcifications, in keeping with coronary artery disease. There are also moderate to severe peripheral atherosclerotic vascular calcifications of thoracic aorta and its major branches. There is apparent hypoattenuation of the blood pool relative to the myocardium, suggestive of anemia. Mediastinum/Nodes: Visualized thyroid gland appears grossly unremarkable. No solid / cystic mediastinal masses. The esophagus is nondistended precluding optimal assessment. There is moderate circumferential thickening of the lower thoracic esophagus, which is most likely seen in the settings of chronic gastroesophageal reflux disease versus esophagitis. No mediastinal or axillary lymphadenopathy by size criteria. Evaluation of bilateral hila is limited due to lack on intravenous contrast: however, no large hilar lymphadenopathy identified. Lungs/Pleura: The central tracheo-bronchial tree is patent. Since the prior study, there are innumerable new, solid noncalcified nodules throughout bilateral lungs with largest in the left lung apex measuring up to 8 x 10 mm and largest in the right lung apex measuring up to 6 x 7 mm, compatible with worsening metastases. There is also trace right pleural effusion with associated compressive atelectatic changes in the right lung lower lobe. No left pleural effusion. No consolidation or pneumothorax on either side. Musculoskeletal: A CT Port-a-Cath is seen in the right upper chest wall with  the  catheter terminating in the cavo-atrial junction region. Visualized soft tissues of the chest wall are otherwise grossly unremarkable. No suspicious osseous lesions. There are mild multilevel degenerative changes in the visualized spine. CT ABDOMEN PELVIS FINDINGS Hepatobiliary: The liver is normal in size. There are innumerable heterogeneous hypoattenuating lesions throughout the liver occupying more than 50% of the total liver parenchyma, compatible with worsening hepatic metastases. 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 No intrahepatic or extrahepatic bile duct dilation. There are hyperattenuating areas within the gallbladder, which may represent gallbladder sludge. No imaging evidence of acute cholecystitis. Pancreas: No discrete focal pancreatic lesions seen within the limitations of this unenhanced exam. No peripancreatic fat stranding. Spleen: Within normal limits. No focal lesion. Adrenals/Urinary Tract: Adrenal glands are unremarkable. No suspicious renal mass within the limitations of this unenhanced exam. No hydroureteronephrosis or nephroureterolithiasis. Urinary bladder is under distended, precluding optimal assessment. However, no large mass or stones identified. No perivesical fat stranding. Stomach/Bowel: No disproportionate dilation of the small or large bowel loops. Redemonstration of heterogeneous hyperattenuating mass centered in the cecum. Vascular/Lymphatic: There is large ascites, significantly increased since the prior study. There are irregular soft tissue nodules throughout the abdomen, which may be due to ascites or due to peritoneal carcinomatosis. No pneumoperitoneum. Redemonstration of multiple prominent para-aortic lymph nodes, grossly similar to the prior study. No aneurysmal dilation of the major abdominal arteries. There are moderate peripheral atherosclerotic vascular calcifications of the aorta and its major branches. Reproductive: The uterus is  unremarkable. No large adnexal mass. Other: The visualized soft tissues and abdominal wall are unremarkable. Musculoskeletal: No suspicious osseous lesions. There are mild - moderate multilevel degenerative changes in the visualized spine. IMPRESSION: 1. Since the prior study, there is interval worsening of metastatic disease with innumerable new pulmonary nodules and innumerable hepatic lesions. There is also interval increase in the ascites. 2. There is new, trace right pleural effusion with associated compressive atelectatic changes in the right lung lower lobe. 3. Multiple other nonacute observations, as described above. Aortic Atherosclerosis (ICD10-I70.0). Electronically Signed   By: Jules Schick M.D.   On: 11/06/2023 11:33    Pending Labs Unresulted Labs (From admission, onward)     Start     Ordered   11/10/2023 0500  CBC  Tomorrow morning,   R        11/06/23 1305   10/22/2023 0500  Comprehensive metabolic panel  Tomorrow morning,   R        11/06/23 1305   11/06/23 1340  Protime-INR  Once,   AD        11/06/23 1340   11/06/23 1305  APTT  Add-on,   AD        11/06/23 1304   11/06/23 0716  Factor 10 assay  Once,   URGENT        11/06/23 0717   11/06/23 0711  Culture, blood (routine x 2)  BLOOD CULTURE X 2,   R      11/06/23 0717            Vitals/Pain Today's Vitals   11/06/23 1130 11/06/23 1137 11/06/23 1500 11/06/23 1521  BP: (!) 112/45  (!) 100/51   Pulse: 68  78   Resp: 14  16   Temp:  (!) 96.1 F (35.6 C)  (!) 97.5 F (36.4 C)  TempSrc:  Axillary  Oral  SpO2: 100%  100%   PainSc:        Isolation Precautions No active  isolations  Medications Medications  octreotide (SANDOSTATIN) 2 mcg/mL load via infusion 50 mcg (50 mcg Intravenous Bolus from Bag 11/06/23 1019)    And  octreotide (SANDOSTATIN) 500 mcg in sodium chloride 0.9 % 250 mL (2 mcg/mL) infusion (50 mcg/hr Intravenous New Bag/Given 11/06/23 1014)  dextrose 50 % solution 25 g (has no administration in  time range)  acetaminophen (TYLENOL) tablet 500 mg (has no administration in time range)    Or  acetaminophen (TYLENOL) suppository 325 mg (has no administration in time range)  ondansetron (ZOFRAN) tablet 4 mg (has no administration in time range)    Or  ondansetron (ZOFRAN) injection 4 mg (has no administration in time range)  magnesium sulfate IVPB 2 g 50 mL (has no administration in time range)  dextrose 5 % and 0.9 % NaCl with KCl 40 mEq/L infusion (has no administration in time range)  oxyCODONE (Oxy IR/ROXICODONE) immediate release tablet 5 mg (has no administration in time range)  pantoprazole (PROTONIX) injection 40 mg (40 mg Intravenous Given 11/06/23 0739)  tranexamic acid (CYKLOKAPRON) IVPB 1,000 mg (0 mg Intravenous Stopped 11/06/23 0804)  ondansetron (ZOFRAN) injection 4 mg (4 mg Intravenous Given 11/06/23 0739)  ciprofloxacin (CIPRO) IVPB 200 mg (0 mg Intravenous Stopped 11/06/23 0944)  calcium gluconate 1 g/ 50 mL sodium chloride IVPB (0 mg Intravenous Stopped 11/06/23 1127)  0.9 % NaCl with KCl 20 mEq/ L  infusion ( Intravenous New Bag/Given 11/06/23 1139)    Mobility non-ambulatory

## 2023-11-06 NOTE — Consult Note (Addendum)
 Consultation Note Date: 11/06/2023   Patient Name: Stacy Burns  DOB: 05-02-1946  MRN: [id number]  Age / Sex: 78 y.o., female   PCP: Lance Bosch, NP Referring Physician: Royanne Foots, DO  Reason for Consultation: Establishing goals of care     Chief Complaint/History of Present Illness:   Patient is a 78 year old female with a past medical history of metastatic colon cancer, aortic atherosclerosis, anemia, history of DVT, history of GI bleed, hepatic cirrhosis, and history of UTIs who presented to the ER on 11/06/2023 for management of melena and hematemesis.  Patient had stated that it started last Thursday.  Upon admission, GI consulted for evaluation.  GI did not recommend any procedures based on patient's current overall medical status; hospice was recommended.  Palliative medicine team consulted to assist with complex medical decision making.  Extensive review of EMR prior to presenting to bedside.  Reviewed CMP which noted patient's creatinine to be 2.73 with a GFR of 17; albumin less than 1.5.  CBC reviewed noted leukocytosis of 13 and hemoglobin of 9.5.  Personally reviewed CT chest abdomen pelvis today noting worsening metastatic disease in the lungs and liver.  Patient has developed ascites as well.  Presented to bedside to see patient.  Patient laying quietly in bed.  No family present at bedside.  With permission, able to introduce myself as a member of the palliative medicine team and my role in patient's medical journey.  Patient described her worsening symptoms at home.  Patient noted that GI had already been by and she heard that she is not going to have any procedures performed due to her overall medical status from cancer.  Informed patient had discussed care with oncologist, Dr. Truett Perna, as well.  Expressed concern that based on patient's medical status, further cancer directed therapy is no longer appropriate as would cause more harm than benefit.  Patient acknowledged  this.  Spent time discussing patient's supported home which she noted is her daughter and son.  Noted recommendation at this time would be to return home with hospice support for her and them at the end of life.  Patient acknowledged this and noted that she would rather be at home with hospice if it is her time so she can pass away there.  Noted would involve TOC to assist with coordination of this care.  Also noted Dr. Truett Perna planning to follow-up with patient tomorrow to confirm his recommendation for home with hospice as well.  Patient acknowledged this and voiced appreciation for discussion.  Did inquire about symptom management at this time.  Patient's primary concern is her pain.  Patient notes that she is able to take oxycodone 5 mg at home as needed to help with her pain management.  Noted would restart this medication.  Patient voiced appreciation for this.  Spent time providing emotional support reactive listening.  All questions answered at that time.  Noted palliative medicine team will continue to follow along with patient's medical journey.  Discussed care with team after visit including hospitalist, oncologist, and RN to coordinate care.  Primary Diagnoses  Present on Admission:  Melena   Past Medical History:  Diagnosis Date   colon ca dx'd 12/2019   COVID-19 virus infection 08/2019   not hospitalized.     Severe anemia 12/17/2019   Social History   Socioeconomic History   Marital status: Widowed    Spouse name: Not on file   Number of children: 2   Years of education: Not  on file   Highest education level: Not on file  Occupational History   Not on file  Tobacco Use   Smoking status: Former    Current packs/day: 0.00    Types: Cigarettes    Start date: 08/15/1959    Quit date: 08/14/1984    Years since quitting: 39.2   Smokeless tobacco: Never  Vaping Use   Vaping status: Never Used  Substance and Sexual Activity   Alcohol use: Not Currently   Drug use: Never    Sexual activity: Not on file  Other Topics Concern   Not on file  Social History Narrative   Not on file   Social Drivers of Health   Financial Resource Strain: Not on file  Food Insecurity: No Food Insecurity (01/19/2023)   Hunger Vital Sign    Worried About Running Out of Food in the Last Year: Never true    Ran Out of Food in the Last Year: Never true  Transportation Needs: No Transportation Needs (01/19/2023)   PRAPARE - Administrator, Civil Service (Medical): No    Lack of Transportation (Non-Medical): No  Physical Activity: Not on file  Stress: Not on file  Social Connections: Not on file   Family History  Problem Relation Age of Onset   Multiple myeloma Mother    Diabetes Mellitus II Mother    Leukemia Father    Muscular dystrophy Brother    Scheduled Meds: Continuous Infusions:  octreotide (SANDOSTATIN) 500 mcg in sodium chloride 0.9 % 250 mL (2 mcg/mL) infusion 50 mcg/hr (11/06/23 1014)   PRN Meds:. Allergies  Allergen Reactions   Milk-Related Compounds Nausea Only and Other (See Comments)    Delayed reaction if too much consumed    Penicillins Anaphylaxis and Other (See Comments)    Both parents had anaphylactic reactions to this   Pineapple Swelling and Other (See Comments)    Tongue swells and causes acid bumps there, also   Morphine Sulfate Other (See Comments)    Patient received IV morphine on 08/17/23. Patient had troubles speaking clearly (word salad). See progress note from 08/17/23   Oxaliplatin Nausea And Vomiting, Rash and Other (See Comments)    Pt noted to be flushed on face, arms, chest and hands and felt very warm.Rash noted on arms and palms of hands.   Chocolate Other (See Comments)    Nasal congestion   Hydrocodone Nausea And Vomiting   Lipitor [Atorvastatin] Other (See Comments)    Bilateral shoulder aches.   Other Itching, Rash and Other (See Comments)    "Most all antibiotics and OTC cold meds break me out" Allergic to ALL  NUTS, also = Itching   Sulfa Antibiotics Rash and Other (See Comments)    Severe rash   CBC:    Component Value Date/Time   WBC 13.0 (H) 11/06/2023 0725   HGB 9.5 (L) 11/06/2023 0725   HGB 10.0 (L) 10/22/2023 0755   HCT 31.2 (L) 11/06/2023 0725   PLT 185 11/06/2023 0725   PLT 235 10/22/2023 0755   MCV 88.6 11/06/2023 0725   NEUTROABS 10.8 (H) 11/06/2023 0725   LYMPHSABS 0.6 (L) 11/06/2023 0725   MONOABS 1.4 (H) 11/06/2023 0725   EOSABS 0.1 11/06/2023 0725   BASOSABS 0.0 11/06/2023 0725   Comprehensive Metabolic Panel:    Component Value Date/Time   NA 139 11/06/2023 0725   NA 140 02/28/2023 1524   K 3.0 (L) 11/06/2023 0725   CL 125 (H) 11/06/2023 0725  CO2 8 (L) 11/06/2023 0725   BUN 61 (H) 11/06/2023 0725   BUN 25 02/28/2023 1524   CREATININE 2.73 (H) 11/06/2023 0725   CREATININE 0.84 10/22/2023 0755   GLUCOSE 48 (L) 11/06/2023 0725   CALCIUM <4.0 (LL) 11/06/2023 0725   AST 44 (H) 11/06/2023 0725   AST 36 10/22/2023 0755   ALT 15 11/06/2023 0725   ALT 9 10/22/2023 0755   ALKPHOS 147 (H) 11/06/2023 0725   BILITOT 0.7 11/06/2023 0725   BILITOT 1.0 10/22/2023 0755   PROT <3.0 (L) 11/06/2023 0725   PROT 6.6 02/28/2023 1524   ALBUMIN <1.5 (L) 11/06/2023 0725   ALBUMIN 3.7 (L) 02/28/2023 1524    Physical Exam: Vital Signs: BP (!) 112/45 (BP Location: Left Arm)   Pulse 68   Temp (!) 96.1 F (35.6 C) (Axillary)   Resp 14   SpO2 100%  SpO2: SpO2: 100 % O2 Device: O2 Device: Room Air O2 Flow Rate:   Intake/output summary:  Intake/Output Summary (Last 24 hours) at 11/06/2023 1237 Last data filed at 11/06/2023 1127 Gross per 24 hour  Intake 278.79 ml  Output --  Net 278.79 ml   LBM:   Baseline Weight:   Most recent weight:    General: NAD, alert, chronically ill-appearing, cachectic, frail Cardiovascular: RRR Respiratory: no increased work of breathing noted, not in respiratory distress Abdomen: distended Neuro: A&Ox4, following commands easily Psych:  appropriately answers all questions          Palliative Performance Scale: 30%               Additional Data Reviewed: Recent Labs    11/06/23 0725  WBC 13.0*  HGB 9.5*  PLT 185  NA 139  BUN 61*  CREATININE 2.73*    Imaging: CT CHEST ABDOMEN PELVIS WO CONTRAST CLINICAL DATA:  ?hemoptysis. H/o metastatic colon CA. * Tracking Code: BO *  EXAM: CT CHEST, ABDOMEN AND PELVIS WITHOUT CONTRAST  TECHNIQUE: Multidetector CT imaging of the chest, abdomen and pelvis was performed following the standard protocol without IV contrast.  RADIATION DOSE REDUCTION: This exam was performed according to the departmental dose-optimization program which includes automated exposure control, adjustment of the mA and/or kV according to patient size and/or use of iterative reconstruction technique.  COMPARISON:  CT scan chest, abdomen and pelvis from 07/22/2023.  FINDINGS: CT CHEST FINDINGS  Cardiovascular: Normal cardiac size. No pericardial effusion. No aortic aneurysm. There are coronary artery calcifications, in keeping with coronary artery disease. There are also moderate to severe peripheral atherosclerotic vascular calcifications of thoracic aorta and its major branches. There is apparent hypoattenuation of the blood pool relative to the myocardium, suggestive of anemia.  Mediastinum/Nodes: Visualized thyroid gland appears grossly unremarkable. No solid / cystic mediastinal masses. The esophagus is nondistended precluding optimal assessment. There is moderate circumferential thickening of the lower thoracic esophagus, which is most likely seen in the settings of chronic gastroesophageal reflux disease versus esophagitis. No mediastinal or axillary lymphadenopathy by size criteria. Evaluation of bilateral hila is limited due to lack on intravenous contrast: however, no large hilar lymphadenopathy identified.  Lungs/Pleura: The central tracheo-bronchial tree is patent. Since the  prior study, there are innumerable new, solid noncalcified nodules throughout bilateral lungs with largest in the left lung apex measuring up to 8 x 10 mm and largest in the right lung apex measuring up to 6 x 7 mm, compatible with worsening metastases. There is also trace right pleural effusion with associated compressive atelectatic changes in the  right lung lower lobe. No left pleural effusion. No consolidation or pneumothorax on either side.  Musculoskeletal: A CT Port-a-Cath is seen in the right upper chest wall with the catheter terminating in the cavo-atrial junction region. Visualized soft tissues of the chest wall are otherwise grossly unremarkable. No suspicious osseous lesions. There are mild multilevel degenerative changes in the visualized spine.  CT ABDOMEN PELVIS FINDINGS  Hepatobiliary: The liver is normal in size. There are innumerable heterogeneous hypoattenuating lesions throughout the liver occupying more than 50% of the total liver parenchyma, compatible with worsening hepatic metastases. 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 No intrahepatic or extrahepatic bile duct dilation. There are hyperattenuating areas within the gallbladder, which may represent gallbladder sludge. No imaging evidence of acute cholecystitis.  Pancreas: No discrete focal pancreatic lesions seen within the limitations of this unenhanced exam. No peripancreatic fat stranding.  Spleen: Within normal limits. No focal lesion.  Adrenals/Urinary Tract: Adrenal glands are unremarkable. No suspicious renal mass within the limitations of this unenhanced exam. No hydroureteronephrosis or nephroureterolithiasis. Urinary bladder is under distended, precluding optimal assessment. However, no large mass or stones identified. No perivesical fat stranding.  Stomach/Bowel: No disproportionate dilation of the small or large bowel loops. Redemonstration of heterogeneous  hyperattenuating mass centered in the cecum.  Vascular/Lymphatic: There is large ascites, significantly increased since the prior study. There are irregular soft tissue nodules throughout the abdomen, which may be due to ascites or due to peritoneal carcinomatosis. No pneumoperitoneum. Redemonstration of multiple prominent para-aortic lymph nodes, grossly similar to the prior study. No aneurysmal dilation of the major abdominal arteries. There are moderate peripheral atherosclerotic vascular calcifications of the aorta and its major branches.  Reproductive: The uterus is unremarkable. No large adnexal mass.  Other: The visualized soft tissues and abdominal wall are unremarkable.  Musculoskeletal: No suspicious osseous lesions. There are mild - moderate multilevel degenerative changes in the visualized spine.  IMPRESSION: 1. Since the prior study, there is interval worsening of metastatic disease with innumerable new pulmonary nodules and innumerable hepatic lesions. There is also interval increase in the ascites. 2. There is new, trace right pleural effusion with associated compressive atelectatic changes in the right lung lower lobe. 3. Multiple other nonacute observations, as described above.  Aortic Atherosclerosis (ICD10-I70.0).  Electronically Signed   By: Jules Schick M.D.   On: 11/06/2023 11:33    I personally reviewed recent imaging.   Palliative Care Assessment and Plan Summary of Established Goals of Care and Medical Treatment Preferences   Patient is a 78 year old female with a past medical history of metastatic colon cancer, aortic atherosclerosis, anemia, history of DVT, history of GI bleed, hepatic cirrhosis, and history of UTIs who presented to the ER on 11/06/2023 for management of melena and hematemesis.  Patient had stated that it started last Thursday.  Upon admission, GI consulted for evaluation.  GI did not recommend any procedures based on patient's  current overall medical status; hospice was recommended.  Palliative medicine team consulted to assist with complex medical decision making.  # Complex medical decision making/goals of care  -Discussed care with patient as detailed above in HPI.  Expressed concern that based on her current medical illness from her metastatic cancer, further cancer directed therapies would cause harm and so at this time hospice has been recommended.  Patient acknowledging that she would rather receive end-of-life care at home with her son and daughter.  Noted would involve TOC to assist with coordination of home hospice  care.  Oncologist planning on seeing patient tomorrow as well; patient voices appreciation for the support.  Continuing appropriate interventions at this time to hopefully allow patient time at home with family while receiving hospice care.  Palliative medicine team continuing to follow along to engage in conversations.  -  Code Status: Limited: Do not attempt resuscitation (DNR) -DNR-LIMITED -Do Not Intubate/DNI    # Symptom management Patient is receiving these palliative interventions for symptom management with an intent to improve quality of life.   -Pain, acute in setting of metastatic colon cancer   -Start oxycodone 5 mg every 4 hours as needed.  This was what patient noted she was taking at home.  # Psycho-social/Spiritual Support:  - Support System: Daughter and son noted in EMR and by patient  # Discharge Planning:  Home with Hospice  Thank you for allowing the palliative care team to participate in the care Terrilee Leotis Pain.  Alvester Morin, DO Palliative Care Provider PMT # [phone]  If patient remains symptomatic despite maximum doses, please call PMT at [phone] between 0700 and 1900. Outside of these hours, please call attending, as PMT does not have night coverage.   Personally spent 80 minutes in patient care including extensive chart review (labs, imaging, progress/consult  notes, vital signs), medically appropraite exam, discussed with treatment team, education to patient, family, and staff, documenting clinical information, medication review and management, coordination of care, and available advanced directive documents.

## 2023-11-06 NOTE — Telephone Encounter (Signed)
 Currently in ER with GI bleed

## 2023-11-06 NOTE — Consult Note (Signed)
 Reason for Consult: Hematemesis and melena Referring Physician: [facility]  Stacy Burns Pain HPI: This is a 78 year old female with a PMH of metastatic colon cancer and malignant ascites admitted for complaints of hematemesis and melena.  She states that her symptoms started last Thursday.  Before this time she did report some issues with minor hematemesis.  Because of the progression of her symptoms, she presented to the ER.  Blood work  showed that her HGB was at 9 g/dL, which is within her baseline range.  She denies any issues with nausea, abdominal pain, or GERD.  Recently Dr. Truett Perna counseled the patient that no further therapies can be applied and she will need to consider hospice care.  Past Medical History:  Diagnosis Date   colon ca dx'd 12/2019   COVID-19 virus infection 08/2019   not hospitalized.     Severe anemia 12/17/2019    Past Surgical History:  Procedure Laterality Date   BIOPSY  12/18/2019   Procedure: BIOPSY;  Surgeon: Tressia Danas, MD;  Location: [facility] ENDOSCOPY;  Service: Gastroenterology;;   COLONOSCOPY WITH PROPOFOL N/A 12/18/2019   Procedure: COLONOSCOPY WITH PROPOFOL;  Surgeon: Tressia Danas, MD;  Location: [facility] ENDOSCOPY;  Service: Gastroenterology;  Laterality: N/A;   ESOPHAGOGASTRODUODENOSCOPY (EGD) WITH PROPOFOL N/A 12/18/2019   Procedure: ESOPHAGOGASTRODUODENOSCOPY (EGD) WITH PROPOFOL;  Surgeon: Tressia Danas, MD;  Location: [facility] ENDOSCOPY;  Service: Gastroenterology;  Laterality: N/A;   IR PARACENTESIS  10/17/2023   PORTACATH PLACEMENT N/A 12/19/2019   Procedure: INSERTION PORT-A-CATH WITH ULTRASOUND;  Surgeon: Emelia Loron, MD;  Location: [facility] OR;  Service: General;  Laterality: N/A;   SUBMUCOSAL TATTOO INJECTION  12/18/2019   Procedure: SUBMUCOSAL TATTOO INJECTION;  Surgeon: Tressia Danas, MD;  Location: [facility] ENDOSCOPY;  Service: Gastroenterology;;   TUBAL LIGATION      Family History  Problem Relation Age of Onset   Multiple myeloma Mother     Diabetes Mellitus II Mother    Leukemia Father    Muscular dystrophy Brother     Social History:  reports that she quit smoking about 39 years ago. Her smoking use included cigarettes. She started smoking about 64 years ago. She has never used smokeless tobacco. She reports that she does not currently use alcohol. She reports that she does not use drugs.  Allergies:  Allergies  Allergen Reactions   Milk-Related Compounds Nausea Only and Other (See Comments)    Delayed reaction if too much consumed    Penicillins Anaphylaxis and Other (See Comments)    Both parents had anaphylactic reactions to this   Pineapple Swelling and Other (See Comments)    Tongue swells and causes acid bumps there, also   Morphine Sulfate Other (See Comments)    Patient received IV morphine on 08/17/23. Patient had troubles speaking clearly (word salad). See progress note from 08/17/23   Oxaliplatin Nausea And Vomiting, Rash and Other (See Comments)    Pt noted to be flushed on face, arms, chest and hands and felt very warm.Rash noted on arms and palms of hands.   Chocolate Other (See Comments)    Nasal congestion   Hydrocodone Nausea And Vomiting   Lipitor [Atorvastatin] Other (See Comments)    Bilateral shoulder aches.   Other Itching, Rash and Other (See Comments)    "Most all antibiotics and OTC cold meds break me out" Allergic to ALL NUTS, also = Itching   Sulfa Antibiotics Rash and Other (See Comments)    Severe rash    Medications: Scheduled:  Continuous:  octreotide (SANDOSTATIN) 500 mcg in sodium chloride 0.9 % 250 mL (2 mcg/mL) infusion 50 mcg/hr (11/06/23 1014)    Results for orders placed or performed during the hospital encounter of 11/06/23 (from the past 24 hours)  CBC with Differential     Status: Abnormal   Collection Time: 11/06/23  7:25 AM  Result Value Ref Range   WBC 13.0 (H) 4.0 - 10.5 K/uL   RBC 3.52 (L) 3.87 - 5.11 MIL/uL   Hemoglobin 9.5 (L) 12.0 - 15.0 g/dL   HCT 16.1 (L) 09.6 -  46.0 %   MCV 88.6 80.0 - 100.0 fL   MCH 27.0 26.0 - 34.0 pg   MCHC 30.4 30.0 - 36.0 g/dL   RDW 04.5 (H) 40.9 - 81.1 %   Platelets 185 150 - 400 K/uL   nRBC 0.0 0.0 - 0.2 %   Neutrophils Relative % 83 %   Neutro Abs 10.8 (H) 1.7 - 7.7 K/uL   Lymphocytes Relative 4 %   Lymphs Abs 0.6 (L) 0.7 - 4.0 K/uL   Monocytes Relative 11 %   Monocytes Absolute 1.4 (H) 0.1 - 1.0 K/uL   Eosinophils Relative 1 %   Eosinophils Absolute 0.1 0.0 - 0.5 K/uL   Basophils Relative 0 %   Basophils Absolute 0.0 0.0 - 0.1 K/uL   Smear Review Normal platelet morphology    Immature Granulocytes 1 %   Abs Immature Granulocytes 0.10 (H) 0.00 - 0.07 K/uL   Ovalocytes PRESENT   Comprehensive metabolic panel     Status: Abnormal   Collection Time: 11/06/23  7:25 AM  Result Value Ref Range   Sodium 139 135 - 145 mmol/L   Potassium 3.0 (L) 3.5 - 5.1 mmol/L   Chloride 125 (H) 98 - 111 mmol/L   CO2 8 (L) 22 - 32 mmol/L   Glucose, Bld 48 (L) 70 - 99 mg/dL   BUN 61 (H) 8 - 23 mg/dL   Creatinine, Ser 9.14 (H) 0.44 - 1.00 mg/dL   Calcium <7.8 (LL) 8.9 - 10.3 mg/dL   Total Protein <2.9 (L) 6.5 - 8.1 g/dL   Albumin <5.6 (L) 3.5 - 5.0 g/dL   AST 44 (H) 15 - 41 U/L   ALT 15 0 - 44 U/L   Alkaline Phosphatase 147 (H) 38 - 126 U/L   Total Bilirubin 0.7 0.0 - 1.2 mg/dL   GFR, Estimated 17 (L) >60 mL/min   Anion gap 6 5 - 15  Type and screen [facility]     Status: None   Collection Time: 11/06/23  7:25 AM  Result Value Ref Range   ABO/RH(D) A POS    Antibody Screen NEG    Sample Expiration      11/09/2023,2359 Performed at [facility], [address], [place], [place] [address]   Magnesium     Status: Abnormal   Collection Time: 11/06/23  7:25 AM  Result Value Ref Range   Magnesium 1.2 (L) 1.7 - 2.4 mg/dL  Phosphorus     Status: None   Collection Time: 11/06/23  7:25 AM  Result Value Ref Range   Phosphorus 2.6 2.5 - 4.6 mg/dL  I-Stat Lactic Acid     Status: Abnormal    Collection Time: 11/06/23  7:36 AM  Result Value Ref Range   Lactic Acid, Venous 2.6 (HH) 0.5 - 1.9 mmol/L   Comment NOTIFIED PHYSICIAN   CBG monitoring, ED     Status: None  Collection Time: 11/06/23  1:20 PM  Result Value Ref Range   Glucose-Capillary 82 70 - 99 mg/dL   *Note: Due to a large number of results and/or encounters for the requested time period, some results have not been displayed. A complete set of results can be found in Results Review.     CT CHEST ABDOMEN PELVIS WO CONTRAST Result Date: 11/06/2023 CLINICAL DATA:  ?hemoptysis. H/o metastatic colon CA. * Tracking Code: BO * EXAM: CT CHEST, ABDOMEN AND PELVIS WITHOUT CONTRAST TECHNIQUE: Multidetector CT imaging of the chest, abdomen and pelvis was performed following the standard protocol without IV contrast. RADIATION DOSE REDUCTION: This exam was performed according to the departmental dose-optimization program which includes automated exposure control, adjustment of the mA and/or kV according to patient size and/or use of iterative reconstruction technique. COMPARISON:  CT scan chest, abdomen and pelvis from 07/22/2023. FINDINGS: CT CHEST FINDINGS Cardiovascular: Normal cardiac size. No pericardial effusion. No aortic aneurysm. There are coronary artery calcifications, in keeping with coronary artery disease. There are also moderate to severe peripheral atherosclerotic vascular calcifications of thoracic aorta and its major branches. There is apparent hypoattenuation of the blood pool relative to the myocardium, suggestive of anemia. Mediastinum/Nodes: Visualized thyroid gland appears grossly unremarkable. No solid / cystic mediastinal masses. The esophagus is nondistended precluding optimal assessment. There is moderate circumferential thickening of the lower thoracic esophagus, which is most likely seen in the settings of chronic gastroesophageal reflux disease versus esophagitis. No mediastinal or axillary lymphadenopathy by size  criteria. Evaluation of bilateral hila is limited due to lack on intravenous contrast: however, no large hilar lymphadenopathy identified. Lungs/Pleura: The central tracheo-bronchial tree is patent. Since the prior study, there are innumerable new, solid noncalcified nodules throughout bilateral lungs with largest in the left lung apex measuring up to 8 x 10 mm and largest in the right lung apex measuring up to 6 x 7 mm, compatible with worsening metastases. There is also trace right pleural effusion with associated compressive atelectatic changes in the right lung lower lobe. No left pleural effusion. No consolidation or pneumothorax on either side. Musculoskeletal: A CT Port-a-Cath is seen in the right upper chest wall with the catheter terminating in the cavo-atrial junction region. Visualized soft tissues of the chest wall are otherwise grossly unremarkable. No suspicious osseous lesions. There are mild multilevel degenerative changes in the visualized spine. CT ABDOMEN PELVIS FINDINGS Hepatobiliary: The liver is normal in size. There are innumerable heterogeneous hypoattenuating lesions throughout the liver occupying more than 50% of the total liver parenchyma, compatible with worsening hepatic metastases. 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 No intrahepatic or extrahepatic bile duct dilation. There are hyperattenuating areas within the gallbladder, which may represent gallbladder sludge. No imaging evidence of acute cholecystitis. Pancreas: No discrete focal pancreatic lesions seen within the limitations of this unenhanced exam. No peripancreatic fat stranding. Spleen: Within normal limits. No focal lesion. Adrenals/Urinary Tract: Adrenal glands are unremarkable. No suspicious renal mass within the limitations of this unenhanced exam. No hydroureteronephrosis or nephroureterolithiasis. Urinary bladder is under distended, precluding optimal assessment. However, no large mass or stones  identified. No perivesical fat stranding. Stomach/Bowel: No disproportionate dilation of the small or large bowel loops. Redemonstration of heterogeneous hyperattenuating mass centered in the cecum. Vascular/Lymphatic: There is large ascites, significantly increased since the prior study. There are irregular soft tissue nodules throughout the abdomen, which may be due to ascites or due to peritoneal carcinomatosis. No pneumoperitoneum. Redemonstration of multiple prominent para-aortic lymph  nodes, grossly similar to the prior study. No aneurysmal dilation of the major abdominal arteries. There are moderate peripheral atherosclerotic vascular calcifications of the aorta and its major branches. Reproductive: The uterus is unremarkable. No large adnexal mass. Other: The visualized soft tissues and abdominal wall are unremarkable. Musculoskeletal: No suspicious osseous lesions. There are mild - moderate multilevel degenerative changes in the visualized spine. IMPRESSION: 1. Since the prior study, there is interval worsening of metastatic disease with innumerable new pulmonary nodules and innumerable hepatic lesions. There is also interval increase in the ascites. 2. There is new, trace right pleural effusion with associated compressive atelectatic changes in the right lung lower lobe. 3. Multiple other nonacute observations, as described above. Aortic Atherosclerosis (ICD10-I70.0). Electronically Signed   By: Jules Schick M.D.   On: 11/06/2023 11:33    ROS:  As stated above in the HPI otherwise negative.  Blood pressure (!) 112/45, pulse 68, temperature (!) 96.1 F (35.6 C), temperature source Axillary, resp. rate 14, SpO2 100%.    PE: Gen: NAD, very frail and weak HEENT:  [place]/AT, EOMI Neck: Supple, no LAD Lungs: CTA Bilaterally CV: RRR without M/G/R ABD: Soft, distended, +BS, tympanic, +ascites Ext: No C/C/E  Assessment/Plan: 1) Hematemesis. 2) Melena. 3) Metastatic colon cancer.   I had a frank  discussion with the patient.  There is very little benefit with performing an EGD in her current state.  Hospice care is recommended and not to pursue any aggressive interventions.  The intervention itself can create complications.  Plan: 1) Maintain PPI. 2) No GI interventions. 3) Signing off.   Vandana Haman D 11/06/2023, 2:12 PM

## 2023-11-06 NOTE — H&P (Signed)
 History and Physical    Patient: Stacy Burns:[id number] DOB: [birth year] DOA: 11/06/2023 DOS: the patient was seen and examined on 11/06/2023 PCP: Lance Bosch, NP  Patient coming from: Home  Chief Complaint:  Chief Complaint  Patient presents with   GI Problem   HPI: Stacy Burns is a 78 y.o. female with medical history significant of metastatic colon cancer, COVID-19, aortic atherosclerosis, chronic blood loss anemia, history of DVT, history of GI bleed, hepatic cirrhosis, history of UTI who presented to the emergency department with complaints of melena and hematemesis for the past 5 days.   No  diarrhea, constipation or hematochezia.She saw oncology today who counseled her against further therapy.  They recommended palliative care evaluation.  She denied fever, chills, rhinorrhea, sore throat or wheezing no chest pain, palpitations, diaphoresis, PND, orthopnea or pitting edema of the lower extremities.   No flank pain, dysuria, frequency or hematuria.  No polyuria, polydipsia, polyphagia or blurred vision.   Lab work: CBC showed a white count 13.0 with 83% neutrophils, hemoglobin 9.5 g/dL and platelets 811.  Most recent hemoglobin was 10.0 g/dL 2 weeks ago.  CMP showed a sodium 139, potassium 3.0, chloride 125 and CO2 8 mmol/L with an anion gap of 6.  Lactic acid was 2.6 mmol/L.  Glucose 48, BUN 61, creatinine 2.73 and calcium less than 4.0 mg/dL.  Total protein is less than 3.0 and albumin less than 1.5 g/dL.  AST 44, ALT 15 and alkaline phosphatase under 47 units/L.  Total bilirubin was normal.  Imaging: CT chest/abdomen/pelvis without contrast showed interval worsening of metastatic disease with innumerable new pulmonary nodules and innumerable hepatic lesions.  There is also increasing ascites.  New, trace right pleural effusion with associated compressive atelectatic changes in the right lower lobe.   ED course: Initial vital signs were temperature 95.8 F, pulse 71, respiration 15,  BP 91/48 mmHg O2 sat 100% on room air.  The patient received Protonix 40 mg IVP, tranexamic acid 1000 mg IVP, ondansetron 4 mg IVP, ciprofloxacin 200 mg IVP, calcium gluconate 1000 mg IVP, octreotide 50 mcg IVP and was started on a octreotide infusion.  Review of Systems: As mentioned in the history of present illness. All other systems reviewed and are negative.  Past Medical History:  Diagnosis Date   colon ca dx'd 12/2019   COVID-19 virus infection 08/2019   not hospitalized.     Severe anemia 12/17/2019   Past Surgical History:  Procedure Laterality Date   BIOPSY  12/18/2019   Procedure: BIOPSY;  Surgeon: Tressia Danas, MD;  Location: [facility] ENDOSCOPY;  Service: Gastroenterology;;   COLONOSCOPY WITH PROPOFOL N/A 12/18/2019   Procedure: COLONOSCOPY WITH PROPOFOL;  Surgeon: Tressia Danas, MD;  Location: [facility] ENDOSCOPY;  Service: Gastroenterology;  Laterality: N/A;   ESOPHAGOGASTRODUODENOSCOPY (EGD) WITH PROPOFOL N/A 12/18/2019   Procedure: ESOPHAGOGASTRODUODENOSCOPY (EGD) WITH PROPOFOL;  Surgeon: Tressia Danas, MD;  Location: [facility] ENDOSCOPY;  Service: Gastroenterology;  Laterality: N/A;   IR PARACENTESIS  10/17/2023   PORTACATH PLACEMENT N/A 12/19/2019   Procedure: INSERTION PORT-A-CATH WITH ULTRASOUND;  Surgeon: Emelia Loron, MD;  Location: [facility] OR;  Service: General;  Laterality: N/A;   SUBMUCOSAL TATTOO INJECTION  12/18/2019   Procedure: SUBMUCOSAL TATTOO INJECTION;  Surgeon: Tressia Danas, MD;  Location: [facility] ENDOSCOPY;  Service: Gastroenterology;;   TUBAL LIGATION     Social History:  reports that she quit smoking about 39 years ago. Her smoking use included cigarettes. She started smoking about 64 years ago. She has never used  smokeless tobacco. She reports that she does not currently use alcohol. She reports that she does not use drugs.  Allergies  Allergen Reactions   Milk-Related Compounds Nausea Only and Other (See Comments)    Delayed reaction if too much consumed     Penicillins Anaphylaxis and Other (See Comments)    Both parents had anaphylactic reactions to this   Pineapple Swelling and Other (See Comments)    Tongue swells and causes acid bumps there, also   Morphine Sulfate Other (See Comments)    Patient received IV morphine on 08/17/23. Patient had troubles speaking clearly (word salad). See progress note from 08/17/23   Oxaliplatin Nausea And Vomiting, Rash and Other (See Comments)    Pt noted to be flushed on face, arms, chest and hands and felt very warm.Rash noted on arms and palms of hands.   Chocolate Other (See Comments)    Nasal congestion   Hydrocodone Nausea And Vomiting   Lipitor [Atorvastatin] Other (See Comments)    Bilateral shoulder aches.   Other Itching, Rash and Other (See Comments)    "Most all antibiotics and OTC cold meds break me out" Allergic to ALL NUTS, also = Itching   Sulfa Antibiotics Rash and Other (See Comments)    Severe rash    Family History  Problem Relation Age of Onset   Multiple myeloma Mother    Diabetes Mellitus II Mother    Leukemia Father    Muscular dystrophy Brother     Prior to Admission medications   Medication Sig Start Date End Date Taking? Authorizing Provider  albuterol (VENTOLIN HFA) 108 (90 Base) MCG/ACT inhaler INHALE 2 PUFFS INTO THE LUNGS FOUR TIMES DAILY AS NEEDED Patient not taking: Reported on 10/22/2023 07/09/23   Ladene Artist, MD  Cholecalciferol (VITAMIN D3) 25 MCG (1000 UT) CAPS Take 1,000 Units by mouth daily.    [provider]  cyanocobalamin (VITAMIN B12) 1000 MCG tablet Take 1,000 mcg by mouth daily.    [provider]  enoxaparin (LOVENOX) 40 MG/0.4ML injection Inject 0.4 mLs (40 mg total) into the skin daily. 08/03/23   Rana Snare, NP  FEROSUL 325 (65 Fe) MG tablet TAKE 1 TABLET BY MOUTH DAILY WITH BREAKFAST 07/09/23   Ladene Artist, MD  lidocaine-prilocaine (EMLA) cream Apply 1 Application topically as needed. 01/19/23   Rana Snare, NP   magnesium oxide (MAG-OX) 400 (240 Mg) MG tablet TAKE 1 TABLET(400 MG) BY MOUTH TWICE DAILY 08/23/23   Ladene Artist, MD  oxyCODONE (OXY IR/ROXICODONE) 5 MG immediate release tablet Take 1-2 tablets (5-10 mg total) by mouth every 4 (four) hours as needed for severe pain (pain score 7-10). 10/15/23   Rana Snare, NP  pantoprazole (PROTONIX) 40 MG tablet Take 1 tablet (40 mg total) by mouth daily. Patient not taking: Reported on 10/22/2023 09/19/23   Ladene Artist, MD  potassium chloride (KLOR-CON) 10 MEQ tablet Take 10 mEq by mouth 2 (two) times daily.    [provider]  rosuvastatin (CRESTOR) 5 MG tablet TAKE 1 TABLET(5 MG) BY MOUTH AT BEDTIME Patient not taking: Reported on 10/22/2023 04/11/23   Tolia, Sunit, DO  trifluridine-tipiracil (LONSURF) 20-8.19 MG tablet Take 2 tablets (40 mg of trifluridine total) by mouth 2 (two) times daily after a meal. Take within 1 hr after AM & PM meals on days 1-5, 8-12. Repeat every 28 days. 10/22/23   Ladene Artist, MD    Physical Exam: Vitals:   11/06/23  0900 11/06/23 1030 11/06/23 1130 11/06/23 1137  BP: (!) 101/49 (!) 95/56 (!) 112/45   Pulse: 71 69 68   Resp: 13 14 14    Temp:    (!) 96.1 F (35.6 C)  TempSrc:    Axillary  SpO2: 100% 100% 100%    Physical Exam Vitals and nursing note reviewed.  Constitutional:      General: She is awake. She is not in acute distress.    Appearance: Normal appearance. She is ill-appearing.  HENT:     Head: Normocephalic.     Nose: No rhinorrhea.     Mouth/Throat:     Mouth: Mucous membranes are moist.  Eyes:     General: No scleral icterus.    Pupils: Pupils are equal, round, and reactive to light.  Neck:     Vascular: No JVD.  Cardiovascular:     Rate and Rhythm: Normal rate and regular rhythm.     Heart sounds: S1 normal and S2 normal.  Pulmonary:     Effort: Pulmonary effort is normal.     Breath sounds: Normal breath sounds.  Abdominal:     General: Bowel sounds are normal. There is no  distension.     Palpations: Abdomen is soft.     Tenderness: There is no abdominal tenderness. There is no guarding.  Musculoskeletal:     Cervical back: Neck supple.     Right lower leg: No edema.     Left lower leg: No edema.  Skin:    General: Skin is warm and dry.     Coloration: Skin is pale.  Neurological:     General: No focal deficit present.     Mental Status: She is alert and oriented to person, place, and time.  Psychiatric:        Mood and Affect: Mood normal.        Behavior: Behavior normal. Behavior is cooperative.     Data Reviewed:  Results are pending, will review when available.  Assessment and Plan: Principal Problem:   Melena   GI bleed In the setting of terminal:   Colon cancer metastasized to liver [facility]) With subsequent:   Acute on chronic blood loss anemia Admit to stepdown/inpatient. Full liquid diet. Continue IV fluids. Continue pantoprazole infusion. Continue octreotide infusion. Monitor H&H. Transfuse as needed. Continue ceftriaxone for SBP prophylaxis. GI consult greatly appreciated.  Active Problems:   AKI (acute kidney injury) (HCC) Continue IV fluids. Avoid hypotension. Avoid nephrotoxins. Monitor intake and output. Monitor renal function electrolytes.    Hypokalemia   Hypomagnesemia KCl supplementation. Magnesium sulfate 2 g IVPB now. Keep electrolytes optimized.    Hypocarbia In the setting of AKI induced acidosis. Continue IV hydration and follow-up BMP in AM.    Atherosclerosis of aorta (HCC) Given life expectancy. No treatment suggested.    History of DVT (deep vein thrombosis) Anticoagulation needs to be held.    Protein-calorie malnutrition, severe (HCC) In the setting of anemia and malignancy. May benefit from protein supplementation. Consider nutritional services evaluation. Follow-up albumin level as needed.    Cancer associated pain Continue analgesics as needed.    Counseling and coordination of  care   Palliative care encounter Consult appreciated. Continue palliative care plan.      Advance Care Planning:   Code Status: Limited: Do not attempt resuscitation (DNR) -DNR-LIMITED -Do Not Intubate/DNI    Consults: Palliative care and gastroenterology.  Family Communication:   Severity of Illness: The appropriate patient status for this patient  is INPATIENT. Inpatient status is judged to be reasonable and necessary in order to provide the required intensity of service to ensure the patient's safety. The patient's presenting symptoms, physical exam findings, and initial radiographic and laboratory data in the context of their chronic comorbidities is felt to place them at high risk for further clinical deterioration. Furthermore, it is not anticipated that the patient will be medically stable for discharge from the hospital within 2 midnights of admission.   * I certify that at the point of admission it is my clinical judgment that the patient will require inpatient hospital care spanning beyond 2 midnights from the point of admission due to high intensity of service, high risk for further deterioration and high frequency of surveillance required.*  Author: Bobette Mo, MD 11/06/2023 12:55 PM  For on call review [email or web address].   This document was prepared using Dragon voice recognition software and may contain some unintended transcription errors.

## 2023-11-07 DIAGNOSIS — K625 Hemorrhage of anus and rectum: Secondary | ICD-10-CM

## 2023-11-07 DIAGNOSIS — Z7189 Other specified counseling: Secondary | ICD-10-CM

## 2023-11-07 DIAGNOSIS — N179 Acute kidney failure, unspecified: Secondary | ICD-10-CM

## 2023-11-07 DIAGNOSIS — C785 Secondary malignant neoplasm of large intestine and rectum: Secondary | ICD-10-CM | POA: Diagnosis not present

## 2023-11-07 DIAGNOSIS — K922 Gastrointestinal hemorrhage, unspecified: Secondary | ICD-10-CM

## 2023-11-07 DIAGNOSIS — Z789 Other specified health status: Secondary | ICD-10-CM

## 2023-11-07 DIAGNOSIS — Z79899 Other long term (current) drug therapy: Secondary | ICD-10-CM

## 2023-11-07 DIAGNOSIS — Z711 Person with feared health complaint in whom no diagnosis is made: Secondary | ICD-10-CM

## 2023-11-07 DIAGNOSIS — D62 Acute posthemorrhagic anemia: Secondary | ICD-10-CM | POA: Diagnosis not present

## 2023-11-07 DIAGNOSIS — E8721 Acute metabolic acidosis: Secondary | ICD-10-CM

## 2023-11-07 DIAGNOSIS — C787 Secondary malignant neoplasm of liver and intrahepatic bile duct: Secondary | ICD-10-CM

## 2023-11-07 DIAGNOSIS — Z515 Encounter for palliative care: Secondary | ICD-10-CM | POA: Diagnosis not present

## 2023-11-07 DIAGNOSIS — C189 Malignant neoplasm of colon, unspecified: Secondary | ICD-10-CM

## 2023-11-07 DIAGNOSIS — R4589 Other symptoms and signs involving emotional state: Secondary | ICD-10-CM

## 2023-11-07 DIAGNOSIS — Z66 Do not resuscitate: Secondary | ICD-10-CM

## 2023-11-07 DIAGNOSIS — E875 Hyperkalemia: Secondary | ICD-10-CM

## 2023-11-07 DIAGNOSIS — K921 Melena: Secondary | ICD-10-CM | POA: Diagnosis not present

## 2023-11-07 DIAGNOSIS — E43 Unspecified severe protein-calorie malnutrition: Secondary | ICD-10-CM | POA: Diagnosis not present

## 2023-11-07 LAB — BASIC METABOLIC PANEL
Anion gap: 12 (ref 5–15)
BUN: 123 mg/dL — ABNORMAL HIGH (ref 8–23)
CO2: 15 mmol/L — ABNORMAL LOW (ref 22–32)
Calcium: 7.5 mg/dL — ABNORMAL LOW (ref 8.9–10.3)
Chloride: 98 mmol/L (ref 98–111)
Creatinine, Ser: 6.59 mg/dL — ABNORMAL HIGH (ref 0.44–1.00)
GFR, Estimated: 6 mL/min — ABNORMAL LOW (ref >60)
Glucose, Bld: 113 mg/dL — ABNORMAL HIGH (ref 70–99)
Potassium: >7.5 mmol/L (ref 3.5–5.1)
Sodium: 125 mmol/L — ABNORMAL LOW (ref 135–145)

## 2023-11-07 LAB — COMPREHENSIVE METABOLIC PANEL
ALT: 30 U/L (ref 0–44)
ALT: 28 U/L (ref 0–44)
AST: 91 U/L — ABNORMAL HIGH (ref 15–41)
AST: 81 U/L — ABNORMAL HIGH (ref 15–41)
Albumin: 1.9 g/dL — ABNORMAL LOW (ref 3.5–5.0)
Albumin: 1.8 g/dL — ABNORMAL LOW (ref 3.5–5.0)
Alkaline Phosphatase: 333 U/L — ABNORMAL HIGH (ref 38–126)
Alkaline Phosphatase: 313 U/L — ABNORMAL HIGH (ref 38–126)
Anion gap: 11 (ref 5–15)
Anion gap: 11 (ref 5–15)
BUN: 122 mg/dL — ABNORMAL HIGH (ref 8–23)
BUN: 117 mg/dL — ABNORMAL HIGH (ref 8–23)
CO2: 14 mmol/L — ABNORMAL LOW (ref 22–32)
CO2: 15 mmol/L — ABNORMAL LOW (ref 22–32)
Calcium: 7.6 mg/dL — ABNORMAL LOW (ref 8.9–10.3)
Calcium: 7.3 mg/dL — ABNORMAL LOW (ref 8.9–10.3)
Chloride: 101 mmol/L (ref 98–111)
Chloride: 101 mmol/L (ref 98–111)
Creatinine, Ser: 6.55 mg/dL — ABNORMAL HIGH (ref 0.44–1.00)
Creatinine, Ser: 6.53 mg/dL — ABNORMAL HIGH (ref 0.44–1.00)
GFR, Estimated: 6 mL/min — ABNORMAL LOW (ref >60)
GFR, Estimated: 6 mL/min — ABNORMAL LOW (ref >60)
Glucose, Bld: 111 mg/dL — ABNORMAL HIGH (ref 70–99)
Glucose, Bld: 96 mg/dL (ref 70–99)
Potassium: >7.5 mmol/L (ref 3.5–5.1)
Potassium: >7.5 mmol/L (ref 3.5–5.1)
Sodium: 126 mmol/L — ABNORMAL LOW (ref 135–145)
Sodium: 127 mmol/L — ABNORMAL LOW (ref 135–145)
Total Bilirubin: 1.7 mg/dL — ABNORMAL HIGH (ref 0.0–1.2)
Total Bilirubin: 1.6 mg/dL — ABNORMAL HIGH (ref 0.0–1.2)
Total Protein: 5.4 g/dL — ABNORMAL LOW (ref 6.5–8.1)
Total Protein: 5 g/dL — ABNORMAL LOW (ref 6.5–8.1)

## 2023-11-07 LAB — CBC
HCT: 25.7 % — ABNORMAL LOW (ref 36.0–46.0)
Hemoglobin: 7.8 g/dL — ABNORMAL LOW (ref 12.0–15.0)
MCH: 26.9 pg (ref 26.0–34.0)
MCHC: 30.4 g/dL (ref 30.0–36.0)
MCV: 88.6 fL (ref 80.0–100.0)
Platelets: 159 10*3/uL (ref 150–400)
RBC: 2.9 MIL/uL — ABNORMAL LOW (ref 3.87–5.11)
RDW: 21.4 % — ABNORMAL HIGH (ref 11.5–15.5)
WBC: 10.7 10*3/uL — ABNORMAL HIGH (ref 4.0–10.5)
nRBC: 0 % (ref 0.0–0.2)

## 2023-11-07 LAB — FACTOR 10 ASSAY: Factor X Activity: 28 % — ABNORMAL LOW (ref 76–183)

## 2023-11-07 MED ORDER — LORAZEPAM 2 MG/ML IJ SOLN: 0.5000 mg | INTRAMUSCULAR | Status: DC | PRN

## 2023-11-07 MED ORDER — LORAZEPAM 2 MG/ML IJ SOLN: 1.0000 mg | INTRAMUSCULAR | Status: DC | PRN

## 2023-11-07 MED ORDER — HYDROMORPHONE HCL 1 MG/ML IJ SOLN
0.5000 mg | INTRAMUSCULAR | Status: DC | PRN
Start: 2023-11-07 — End: 2023-11-08

## 2023-11-07 MED ORDER — LORAZEPAM 2 MG/ML IJ SOLN
0.5000 mg | INTRAMUSCULAR | Status: DC | PRN
  Administered 2023-11-07: 0.5 mg via INTRAVENOUS
  Filled 2023-11-07 (×2): qty 1

## 2023-11-07 MED ORDER — BIOTENE DRY MOUTH MT LIQD: 15.0000 mL | OROMUCOSAL | Status: DC | PRN

## 2023-11-07 MED ORDER — GLYCOPYRROLATE 0.2 MG/ML IJ SOLN: 0.2000 mg | INTRAMUSCULAR | Status: DC | PRN

## 2023-11-07 MED ORDER — HYDROMORPHONE HCL 1 MG/ML IJ SOLN
0.5000 mg | INTRAMUSCULAR | Status: DC | PRN
  Administered 2023-11-07: 0.5 mg via INTRAVENOUS
  Administered 2023-11-07: 1 mg via INTRAVENOUS
  Filled 2023-11-07 (×2): qty 1

## 2023-11-07 MED ORDER — HALOPERIDOL LACTATE 5 MG/ML IJ SOLN: 0.5000 mg | INTRAMUSCULAR | Status: DC | PRN

## 2023-11-07 MED ORDER — ONDANSETRON HCL 4 MG/2ML IJ SOLN: 4.0000 mg | Freq: Four times a day (QID) | INTRAMUSCULAR | Status: DC | PRN

## 2023-11-07 MED ORDER — POLYVINYL ALCOHOL 1.4 % OP SOLN: 1.0000 [drp] | Freq: Four times a day (QID) | OPHTHALMIC | Status: DC | PRN

## 2023-11-07 MED ORDER — GLYCOPYRROLATE 0.2 MG/ML IJ SOLN
0.2000 mg | INTRAMUSCULAR | Status: DC | PRN
  Administered 2023-11-07: 0.2 mg via INTRAVENOUS
  Filled 2023-11-07: qty 1

## 2023-11-11 LAB — CULTURE, BLOOD (ROUTINE X 2)
Culture: NO GROWTH
Culture: NO GROWTH

## 2023-11-13 NOTE — Progress Notes (Signed)
  Daily Progress Note   Patient Name: MARQUELLE BALOW       Date: 10/20/2023 DOB: [birth year]  Age: 78 y.o. MRN#: [id number] Attending Physician: Carollee Herter, DO Primary Care Physician: Lance Bosch, NP Admit Date: 11/06/2023 Length of Stay: 1 day  Discussed care with primary hospitalist today. Patient's lab work acutely worsened with multisystem organ failure. Spoke with patient and transitioning to comfort focused care at this time. Updated patient's daughter to inform of this; she agreed with supporting patient's wishes for comfort. Patient requests [facility] evaluation. Have discussed with IDT and appropriately updated orders. Will place full progress note when able.    Alvester Morin, DO Palliative Care Provider PMT # [phone]  No Charge Note

## 2023-11-13 NOTE — Assessment & Plan Note (Signed)
 10/26/2023 terminal care.  Hospice and oncology involved.

## 2023-11-13 NOTE — Assessment & Plan Note (Signed)
 10/16/2023 now terminal care.  Stop checking labs.

## 2023-11-13 NOTE — Assessment & Plan Note (Signed)
 10/29/2023 likely due to her colon cancer.  Patient is now DNR comfort care.  Stop checking labs.

## 2023-11-13 NOTE — Assessment & Plan Note (Signed)
 10/14/2023 due to metastatic colon cancer.

## 2023-11-13 NOTE — Progress Notes (Signed)
 Redness noted surrounding right chest port site, pt may have allergy to tegaderm and hypafix tape, both were intact prior to port re access. Suresite matrix dressing placed to secure accessed port. Primary nurse aware.

## 2023-11-13 NOTE — Assessment & Plan Note (Addendum)
 11/09/23 pt accepted to [facility] for inpatient hospice. Will leave her port-a-cath accessed so pt can received IV meds.    **Pt expired prior to being discharged to inpatient hospice. Pt died at 1700 on 2023/11/09.

## 2023-11-13 NOTE — Progress Notes (Signed)
   Notified by RN that patient has expired at 1700 on 11/10/2023  Patient was comfort care  2 RN verified.  Family was not available to RN.  Carollee Herter, DO [facility]

## 2023-11-13 NOTE — Progress Notes (Signed)
 Daily Progress Note   Patient Name: Stacy Burns       Date: 10/16/2023 DOB: 1946/06/15  Age: 78 y.o. MRN#: [id number] Attending Physician: Carollee Herter, DO Primary Care Physician: Lance Bosch, NP Admit Date: 11/06/2023 Length of Stay: 1 day  Reason for Consultation/Follow-up: Establishing goals of care  Subjective:   CC: Patient agreeing with transition to comfort focused care at this time.  Following up regarding complex medical decision making.  Subjective:  Reviewed EMR prior to presenting to bedside this morning.  Patient's repeat BMP x 3 shows acute renal failure with creatinine up to 6.59, GFR of 6, and potassium > 7.5.  Discussed care with hospitalist and RN for updates.  ------------------------------------------------------------------------------------------------------------- Advance Care Planning Conversation  Pertinent diagnosis: Metastatic colon cancer, history of GI bleed, hepatic cirrhosis, acute renal failure  The patient and/or family consented to a voluntary Advance Care Planning Conversation. Individuals present for the conversation: Discussed care with patient directly.  Also discussed care with patient's family throughout the day including son, daughter, and grandson.    Summary of the conversation:  Presented to bedside to see patient.  No family present at bedside.  Patient remembered meeting this provider yesterday.  Spent time discussing patient's worsening medical status including renal failure.  Expressed concern that time is growing short and going home with hospice may not be an option anymore.  Patient acknowledged this.  We discussed could focus on her comfort here and what this would and would not entail.  Explained then can seek potential inpatient hospice support.  Patient agree with transition to comfort focused care at this time.  Patient requested that she would want to stay here in [place] so specifically requested [facility] as prior family has  gone there.  Noted would inform team of this decision.  Inquired about calling patient's family and patient noted I could reach out to her daughter.  Able to call patient's daughter, Stacy Burns.  Introduced myself my role as a member of the palliative medicine team.  Spent time discussing patient's medical illness and acute worsening of renal function.  Stacy Burns noted that family has been expecting this deterioration as they could see patient worsening at home.  Explained the discussion had with patient.  Stacy Burns agrees with focusing on patient's comfort at this time.  Discussed we will be making inpatient hospice referral which family supports.  Inquired about calling patient's son though Stacy Burns noted that he was currently driving to the hospital so would prefer he be at the hospital before this is discussed so he does not become upset during the drive.  Acknowledges noted would return to bedside later when able.  Informed by RN once family members present at bedside.  RN also noted family had mentioned that patient's grandson is healthcare power of attorney.  Presented to bedside to meet with family.  Met patient's son, his significant other, and patient's daughter.  Again spent time discussing patient's current medical illness and decision to focus on comfort focused care at this time.  Family again supporting this.  Stacy Burns did note that her son, Stacy Burns, is patient's POA/HCPOA send noted would reach out to inform him for updates as well.  At this time continuing comfort focused care.  Then able to call patient's grandson, Stacy Burns.  Introduced myself my role as a member of the palliative medicine team.  Spent time again reviewing patient's overall medical illness and deterioration.  Stacy Burns acknowledged this noted he also supports patient's wishes for comfort focused care  at this time.  He also agrees with referral to [facility] for evaluation.  Noted would continue comfort care with possible in-hospital death versus  [facility] transfer.  Spent time answering questions and providing emotional support.  Noted palliative medicine team will continue to follow along with patient's medical journey.  Outcome of the conversations and/or documents completed:  Patient transitioning to comfort focused care at this time as per her own decision.  Family agreeing with supporting transition to comfort focused care.  Referring for evaluation at inpatient hospice for [facility].  If patient not accepted to [facility], would anticipate in-hospital death.  I spent 50 minutes providing separately identifiable ACP services with the patient and/or surrogate decision maker in a voluntary, in-person conversation discussing the patient's wishes and goals as detailed in the above note.  Alvester Morin, DO Palliative Care Provider  -------------------------------------------------------------------------------------------------------------  Continued discussions with IDT including hospitalist, RN, oncologist, TOC, ACC liaison throughout the day.  Objective:   Vital Signs:  BP (!) 94/49 (BP Location: Right Arm)   Pulse 72   Temp (!) 97.5 F (36.4 C) (Axillary)   Resp 18   Ht 5\' 7"  (1.702 m)   Wt 58.5 kg   SpO2 98%   BMI 20.20 kg/m   Physical Exam: General: Frail, cachectic, ill-appearing, appropriately interactive Cardiovascular: RRR Respiratory: no increased work of breathing noted, not in respiratory distress Skin: Multiple ecchymoses on upper extremities bilaterally Neuro: A&Ox4, following commands easily Psych: appropriately answers all questions  Imaging: I personally reviewed recent imaging.   Assessment & Plan:   Assessment: Patient is a 78 year old female with a past medical history of metastatic colon cancer, aortic atherosclerosis, anemia, history of DVT, history of GI bleed, hepatic cirrhosis, and history of UTIs who presented to the ER on 11/06/2023 for management of melena and hematemesis. Patient  had stated that it started last Thursday. Upon admission, GI consulted for evaluation. GI did not recommend any procedures based on patient's current overall medical status; hospice was recommended. Palliative medicine team consulted to assist with complex medical decision making.   Recommendations/Plan: # Complex medical decision making/goals of care:  -Discussed care with patient who was appropriately able to interact as detailed above in HPI.  Also spent time discussing care with multiple family members during the day.  Patient acknowledges her worsening medical status and agrees she would like to focus on comfort at the end of life.  Patient requested evaluation for inpatient hospice at [facility].  Have consulted TOC to assist.  Family supporting of patient's wishes for comfort focused care at the end of life.  -At this time we will discontinue interventions that are no longer focused on comfort such as IV fluids, imaging, or lab work.  Will instead focus on symptom management of Burns, dyspnea, and agitation in the setting of end-of-life care.  -Patient's grandson, Stacy Burns, is reported to be patient's POA/HCPOA.  He noted that if patient is to pass, he has already worked with her on funeral planning and would have this information.  Please call him with updates.    Code Status: Do not attempt resuscitation (DNR) - Comfort care  # Symptom management Patient is receiving these palliative interventions for symptom management with an intent to improve quality of life.     -Burns/Dyspnea, acute in the setting of end-of-life care                Patient was not on medications for Burns previously.                               -  Start IV Dilaudid 0.5-1 mg IV every 30 minutes as needed.  Continue to adjust based on patient's symptom burden.  If patient needing frequent dosing, may need to consider continuous infusion.     -Continue use morphine due to renal failure                   -Anxiety/agitation, in the setting of end-of-life care                               -Start IV Ativan 0.5 mg every 2 hours as needed. Continue to adjust based on patient's symptom burden.                                 -Start IV Haldol 0.5 mg every 4 hours as needed. Continue to adjust based on patient's symptom burden.                   -Secretions, in the setting of end-of-life care                               -Start IV glycopyrrolate 0.2 mg every 4 hours as needed.  # Psychosocial Support:  -Daughter, son, grandchildren   # Discharge Planning: Hospice facility VS in hospital death   Discussed with: patient, patient's daughter, RN, hospitalist, oncologist   Thank you for allowing the palliative care team to participate in the care Stacy Burns.  Alvester Morin, DO Palliative Care Provider PMT # [phone]  If patient remains symptomatic despite maximum doses, please call PMT at [phone] between 0700 and 1900. Outside of these hours, please call attending, as PMT does not have night coverage. Marland Kitchen

## 2023-11-13 NOTE — Progress Notes (Signed)
 I met with Stacy Burns and her daughter to provide support.  They are at peace and her daughter just wants her to be pain-free and not afraid.  Stacy Burns was agitated during the visit and her daughter alerted the nurse.  She is still communicating, but is difficult to understand.  I provided prayer at her daugther's request.  They are hoping that she will move to inpatient hospice today.

## 2023-11-13 NOTE — Assessment & Plan Note (Addendum)
 10/16/2023 now terminal care.  Stop checking labs.

## 2023-11-13 NOTE — Subjective & Objective (Signed)
 Pt seen and examined. No family at bedside. Labs remain critically abnormal with repeat K >7.5.  oncology and palliative care involved.

## 2023-11-13 NOTE — Progress Notes (Signed)
 PROGRESS NOTE    Stacy Burns  GNF:[id number] DOB: 01/02/1946 DOA: 11/06/2023 PCP: Lance Bosch, NP  Subjective: Pt seen and examined. No family at bedside. Labs remain critically abnormal with repeat K >7.5.  oncology and palliative care involved.    Hospital Course: HPI: Stacy Burns is a 78 y.o. female with medical history significant of metastatic colon cancer, COVID-19, aortic atherosclerosis, chronic blood loss anemia, history of DVT, history of GI bleed, hepatic cirrhosis, history of UTI who presented to the emergency department with complaints of melena and hematemesis for the past 5 days.   No  diarrhea, constipation or hematochezia.She saw oncology today who counseled her against further therapy.  They recommended palliative care evaluation.  She denied fever, chills, rhinorrhea, sore throat or wheezing no chest pain, palpitations, diaphoresis, PND, orthopnea or pitting edema of the lower extremities.   No flank pain, dysuria, frequency or hematuria.  No polyuria, polydipsia, polyphagia or blurred vision.      ED course: Initial vital signs were temperature 95.8 F, pulse 71, respiration 15, BP 91/48 mmHg O2 sat 100% on room air.  The patient received Protonix 40 mg IVP, tranexamic acid 1000 mg IVP, ondansetron 4 mg IVP, ciprofloxacin 200 mg IVP, calcium gluconate 1000 mg IVP, octreotide 50 mcg IVP and was started on a octreotide infusion.  Significant Events: Admitted 11/06/2023 for GI bleeding   Significant Labs:  CBC showed a white count 13.0 with 83% neutrophils, hemoglobin 9.5 g/dL and platelets 846.  Most recent hemoglobin was 10.0 g/dL 2 weeks ago.   CMP showed a sodium 139, potassium 3.0, chloride 125 and CO2 8 mmol/L with an anion gap of 6.  Lactic acid was 2.6 mmol/L.  Glucose 48, BUN 61, creatinine 2.73 and calcium less than 4.0 mg/dL.   Total protein is less than 3.0 and albumin less than 1.5 g/dL.  AST 44, ALT 15 and alkaline phosphatase under 47 units/L.  Total  bilirubin was normal.    Significant Imaging Studies: CT chest/abdomen/pelvis without contrast showed interval worsening of metastatic disease with innumerable new pulmonary nodules and innumerable hepatic lesions. There is also increasing ascites. New, trace right pleural effusion with associated compressive atelectatic changes in the right lower lobe.   Antibiotic Therapy: Anti-infectives (From admission, onward)    Start     Dose/Rate Route Frequency Ordered Stop   11/01/2023 0800  ciprofloxacin (CIPRO) IVPB 200 mg        200 mg 100 mL/hr over 60 Minutes Intravenous Every 24 hours 11/06/23 2347     11/06/23 0745  ciprofloxacin (CIPRO) IVPB 200 mg        200 mg 100 mL/hr over 60 Minutes Intravenous  Once 11/06/23 0741 11/06/23 0944       Procedures:   Consultants: Palliative Care GI    Assessment and Plan: * Melena 11/01/2023 likely due to her colon cancer.  Patient is now DNR comfort care.  Stop checking labs.  Acute metabolic acidosis 10/15/2023 Likely due to the patient's acute GI bleeding.  With her metabolic acidosis and her hyperkalemia, she is likely actively dying.  She is now terminal care.  Stop checking labs.  Hyperkalemia 11/02/2023 now terminal care.  Stop checking labs.  Colon cancer metastasized to liver [facility]) 10/27/2023 terminal care.  Hospice and oncology involved.  Hypomagnesemia 11/01/2023 now terminal care.  Stop checking labs.  Hypoglycemia 11/03/2023 now terminal care.  Stop checking labs.  AKI (acute kidney injury) (HCC) 10/22/2023 now terminal care.  Stop checking labs.  Acute on chronic blood loss anemia 10/17/2023 due to metastatic colon cancer.  GI bleed 10/17/2023 due to metastatic colon cancer.  Cancer associated pain 11/02/2023 now terminal care. Continue with IV opiates prn.  Protein-calorie malnutrition, severe (HCC) 10/29/2023 now terminal care.  History of DVT (deep vein thrombosis) 10/30/2023 now terminal  care.  Hypokalemia 10/26/2023 now terminal care.  Stop checking labs.   DVT prophylaxis:   None because of comfort care   Code Status: Do not attempt resuscitation (DNR) - Comfort care Family Communication: no family at bedside Disposition Plan: hospice Reason for continuing need for hospitalization: needs inpatient hospice. Remains on IV dilaudid  Objective: Vitals:   10/22/2023 0155 11/05/2023 0200 11/10/2023 0529 11/09/2023 0813  BP: (!) 86/50  (!) 84/49 (!) 94/49  Pulse: 72  71 72  Resp: 14  14 18   Temp:  (!) 97.5 F (36.4 C) (!) 97.5 F (36.4 C) (!) 97.5 F (36.4 C)  TempSrc:  Oral Axillary Axillary  SpO2: 98%  93% 98%  Weight:      Height:        Intake/Output Summary (Last 24 hours) at 10/27/2023 1250 Last data filed at 11/08/2023 1000 Gross per 24 hour  Intake 1081.31 ml  Output 50 ml  Net 1031.31 ml   Filed Weights   11/06/23 2327  Weight: 58.5 kg    Examination:  Physical Exam Vitals and nursing note reviewed.  Constitutional:      Comments: Appear chronically and acutely ill  HENT:     Head: Normocephalic and atraumatic.  Cardiovascular:     Rate and Rhythm: Normal rate and regular rhythm.  Pulmonary:     Effort: Pulmonary effort is normal. No respiratory distress.     Breath sounds: Normal breath sounds.  Abdominal:     Comments: Generalized tenderness. No rebound  Skin:    General: Skin is warm and dry.     Capillary Refill: Capillary refill takes less than 2 seconds.  Neurological:     Mental Status: She is disoriented.    Data Reviewed: I have personally reviewed following labs and imaging studies  CBC: Recent Labs  Lab 11/06/23 0725 11/05/2023 0248  WBC 13.0* 10.7*  NEUTROABS 10.8*  --   HGB 9.5* 7.8*  HCT 31.2* 25.7*  MCV 88.6 88.6  PLT 185 159   Basic Metabolic Panel: Recent Labs  Lab 11/06/23 0725 11/01/2023 0429 10/19/2023 0700 11/01/2023 0946  NA 139 126* 127* 125*  K 3.0* >7.5* >7.5* >7.5*  CL 125* 101 101 98  CO2 8* 14* 15*  15*  GLUCOSE 48* 111* 96 113*  BUN 61* 122* 117* 123*  CREATININE 2.73* 6.55* 6.53* 6.59*  CALCIUM <4.0* 7.6* 7.3* 7.5*  MG 1.2*  --   --   --   PHOS 2.6  --   --   --    GFR: Estimated Creatinine Clearance: 6.5 mL/min (A) (by C-G formula based on SCr of 6.59 mg/dL (H)). Liver Function Tests: Recent Labs  Lab 11/06/23 0725 11/10/2023 0429 11/01/2023 0700  AST 44* 91* 81*  ALT 15 30 28   ALKPHOS 147* 333* 313*  BILITOT 0.7 1.7* 1.6*  PROT <3.0* 5.4* 5.0*  ALBUMIN <1.5* 1.9* 1.8*   Coagulation Profile: Recent Labs  Lab 11/06/23 1750  INR 1.8*   BNP (last 3 results) Recent Labs    01/19/23 1923  BNP 114.9*   CBG: Recent Labs  Lab 11/06/23 1320  GLUCAP 82   Sepsis Labs: Recent Labs  Lab  11/06/23 0736  LATICACIDVEN 2.6*    Recent Results (from the past 240 hours)  Culture, blood (routine x 2)     Status: None (Preliminary result)   Collection Time: 11/06/23  9:05 AM   Specimen: BLOOD  Result Value Ref Range Status   Specimen Description   Final    BLOOD BLOOD RIGHT FOREARM Performed at [facility], [address] [place], [place] [address]    Special Requests   Final    BOTTLES DRAWN AEROBIC AND ANAEROBIC Blood Culture results may not be optimal due to an inadequate volume of blood received in culture bottles Performed at [facility], [address], [place], [place] [address]    Culture   Final    NO GROWTH < 24 HOURS Performed at [facility], [address], [place], [place] [address]    Report Status PENDING  Incomplete  Culture, blood (routine x 2)     Status: None (Preliminary result)   Collection Time: 11/06/23  9:15 AM   Specimen: BLOOD  Result Value Ref Range Status   Specimen Description   Final    BLOOD BLOOD LEFT FOREARM Performed at [facility], [address] [place], [place] [address]    Special Requests   Final    BOTTLES DRAWN AEROBIC ONLY Blood Culture results may not  be optimal due to an inadequate volume of blood received in culture bottles Performed at [facility], [address], [place], [place] [address]    Culture   Final    NO GROWTH < 24 HOURS Performed at [facility], [address], [place], [place] [address]    Report Status PENDING  Incomplete     Radiology Studies: CT CHEST ABDOMEN PELVIS WO CONTRAST Result Date: 11/06/2023 CLINICAL DATA:  ?hemoptysis. H/o metastatic colon CA. * Tracking Code: BO * EXAM: CT CHEST, ABDOMEN AND PELVIS WITHOUT CONTRAST TECHNIQUE: Multidetector CT imaging of the chest, abdomen and pelvis was performed following the standard protocol without IV contrast. RADIATION DOSE REDUCTION: This exam was performed according to the departmental dose-optimization program which includes automated exposure control, adjustment of the mA and/or kV according to patient size and/or use of iterative reconstruction technique. COMPARISON:  CT scan chest, abdomen and pelvis from 07/22/2023. FINDINGS: CT CHEST FINDINGS Cardiovascular: Normal cardiac size. No pericardial effusion. No aortic aneurysm. There are coronary artery calcifications, in keeping with coronary artery disease. There are also moderate to severe peripheral atherosclerotic vascular calcifications of thoracic aorta and its major branches. There is apparent hypoattenuation of the blood pool relative to the myocardium, suggestive of anemia. Mediastinum/Nodes: Visualized thyroid gland appears grossly unremarkable. No solid / cystic mediastinal masses. The esophagus is nondistended precluding optimal assessment. There is moderate circumferential thickening of the lower thoracic esophagus, which is most likely seen in the settings of chronic gastroesophageal reflux disease versus esophagitis. No mediastinal or axillary lymphadenopathy by size criteria. Evaluation of bilateral hila is limited due to lack on intravenous contrast: however, no large hilar  lymphadenopathy identified. Lungs/Pleura: The central tracheo-bronchial tree is patent. Since the prior study, there are innumerable new, solid noncalcified nodules throughout bilateral lungs with largest in the left lung apex measuring up to 8 x 10 mm and largest in the right lung apex measuring up to 6 x 7 mm, compatible with worsening metastases. There is also trace right pleural effusion with associated compressive atelectatic changes in the right lung lower lobe. No left  pleural effusion. No consolidation or pneumothorax on either side. Musculoskeletal: A CT Port-a-Cath is seen in the right upper chest wall with the catheter terminating in the cavo-atrial junction region. Visualized soft tissues of the chest wall are otherwise grossly unremarkable. No suspicious osseous lesions. There are mild multilevel degenerative changes in the visualized spine. CT ABDOMEN PELVIS FINDINGS Hepatobiliary: The liver is normal in size. There are innumerable heterogeneous hypoattenuating lesions throughout the liver occupying more than 50% of the total liver parenchyma, compatible with worsening hepatic metastases. 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 No intrahepatic or extrahepatic bile duct dilation. There are hyperattenuating areas within the gallbladder, which may represent gallbladder sludge. No imaging evidence of acute cholecystitis. Pancreas: No discrete focal pancreatic lesions seen within the limitations of this unenhanced exam. No peripancreatic fat stranding. Spleen: Within normal limits. No focal lesion. Adrenals/Urinary Tract: Adrenal glands are unremarkable. No suspicious renal mass within the limitations of this unenhanced exam. No hydroureteronephrosis or nephroureterolithiasis. Urinary bladder is under distended, precluding optimal assessment. However, no large mass or stones identified. No perivesical fat stranding. Stomach/Bowel: No disproportionate dilation of the small or large bowel  loops. Redemonstration of heterogeneous hyperattenuating mass centered in the cecum. Vascular/Lymphatic: There is large ascites, significantly increased since the prior study. There are irregular soft tissue nodules throughout the abdomen, which may be due to ascites or due to peritoneal carcinomatosis. No pneumoperitoneum. Redemonstration of multiple prominent para-aortic lymph nodes, grossly similar to the prior study. No aneurysmal dilation of the major abdominal arteries. There are moderate peripheral atherosclerotic vascular calcifications of the aorta and its major branches. Reproductive: The uterus is unremarkable. No large adnexal mass. Other: The visualized soft tissues and abdominal wall are unremarkable. Musculoskeletal: No suspicious osseous lesions. There are mild - moderate multilevel degenerative changes in the visualized spine. IMPRESSION: 1. Since the prior study, there is interval worsening of metastatic disease with innumerable new pulmonary nodules and innumerable hepatic lesions. There is also interval increase in the ascites. 2. There is new, trace right pleural effusion with associated compressive atelectatic changes in the right lung lower lobe. 3. Multiple other nonacute observations, as described above. Aortic Atherosclerosis (ICD10-I70.0). Electronically Signed   By: Jules Schick M.D.   On: 11/06/2023 11:33    Scheduled Meds:  pantoprazole (PROTONIX) IV  40 mg Intravenous Q12H   sodium chloride flush  10-40 mL Intracatheter Q12H   Continuous Infusions:   LOS: 1 day   Time spent: 40 minutes  Carollee Herter, DO  [facility]  11/02/2023, 12:50 PM

## 2023-11-13 NOTE — Hospital Course (Addendum)
 HPI: Stacy Burns is a 78 y.o. female with medical history significant of metastatic colon cancer, COVID-19, aortic atherosclerosis, chronic blood loss anemia, history of DVT, history of GI bleed, hepatic cirrhosis, history of UTI who presented to the emergency department with complaints of melena and hematemesis for the past 5 days.   No  diarrhea, constipation or hematochezia.She saw oncology today who counseled her against further therapy.  They recommended palliative care evaluation.  She denied fever, chills, rhinorrhea, sore throat or wheezing no chest pain, palpitations, diaphoresis, PND, orthopnea or pitting edema of the lower extremities.   No flank pain, dysuria, frequency or hematuria.  No polyuria, polydipsia, polyphagia or blurred vision.      ED course: Initial vital signs were temperature 95.8 F, pulse 71, respiration 15, BP 91/48 mmHg O2 sat 100% on room air.  The patient received Protonix 40 mg IVP, tranexamic acid 1000 mg IVP, ondansetron 4 mg IVP, ciprofloxacin 200 mg IVP, calcium gluconate 1000 mg IVP, octreotide 50 mcg IVP and was started on a octreotide infusion.  Significant Events: Admitted 11/06/2023 for GI bleeding   Significant Labs:  CBC showed a white count 13.0 with 83% neutrophils, hemoglobin 9.5 g/dL and platelets 295.  Most recent hemoglobin was 10.0 g/dL 2 weeks ago.   CMP showed a sodium 139, potassium 3.0, chloride 125 and CO2 8 mmol/L with an anion gap of 6.  Lactic acid was 2.6 mmol/L.  Glucose 48, BUN 61, creatinine 2.73 and calcium less than 4.0 mg/dL.   Total protein is less than 3.0 and albumin less than 1.5 g/dL.  AST 44, ALT 15 and alkaline phosphatase under 47 units/L.  Total bilirubin was normal.    Significant Imaging Studies: CT chest/abdomen/pelvis without contrast showed interval worsening of metastatic disease with innumerable new pulmonary nodules and innumerable hepatic lesions. There is also increasing ascites. New, trace right pleural effusion  with associated compressive atelectatic changes in the right lower lobe.   Antibiotic Therapy: Anti-infectives (From admission, onward)    Start     Dose/Rate Route Frequency Ordered Stop   11/05/2023 0800  ciprofloxacin (CIPRO) IVPB 200 mg        200 mg 100 mL/hr over 60 Minutes Intravenous Every 24 hours 11/06/23 2347     11/06/23 0745  ciprofloxacin (CIPRO) IVPB 200 mg        200 mg 100 mL/hr over 60 Minutes Intravenous  Once 11/06/23 0741 11/06/23 0944       Procedures:   Consultants: Palliative Care GI oncology

## 2023-11-13 NOTE — Progress Notes (Signed)
 IP PROGRESS NOTE  Subjective:   Stacy Burns is well-known to me with a history of metastatic colon cancer.  There has been recent clinical and radiologic evidence of disease progression.  She began a trial of salvage therapy with Lonsurf/bevacizumab on 10/22/2023. Stacy Burns was seen in the emergency room in [place] last week (we do not have details of this evaluation available today).  She presented to the emergency room here yesterday with rectal bleeding/hematemesis and failure to thrive.  A chemistry panel revealed acute renal failure the hemoglobin has not changed significantly from baseline.  Objective: Vital signs in last 24 hours: Blood pressure (!) 94/49, pulse 72, temperature (!) 97.5 F (36.4 C), temperature source Axillary, resp. rate 18, height 5\' 7"  (1.702 m), weight 128 lb 15.5 oz (58.5 kg), SpO2 98%.  Intake/Output from previous day: 03/25 0701 - 03/26 0700 In: 1141.7 [I.V.:812; IV Piggyback:329.7] Out: 50 [Urine:50]  Physical Exam:  HEENT: No thrush Lungs: Clear anteriorly Cardiac: Regular rate and rhythm Abdomen: Distended with ascites, the liver is palpable in the upper abdomen Extremities: No leg edema   Portacath/PICC-without erythema  Lab Results: Recent Labs    11/06/23 0725 10/25/2023 0248  WBC 13.0* 10.7*  HGB 9.5* 7.8*  HCT 31.2* 25.7*  PLT 185 159    BMET Recent Labs    11/09/2023 0700 10/25/2023 0946  NA 127* 125*  K >7.5* >7.5*  CL 101 98  CO2 15* 15*  GLUCOSE 96 113*  BUN 117* 123*  CREATININE 6.53* 6.59*  CALCIUM 7.3* 7.5*    Lab Results  Component Value Date   CEA1 54.57 (H) 12/30/2020   CEA 2,846.38 (H) 10/17/2023    Studies/Results: CT CHEST ABDOMEN PELVIS WO CONTRAST Result Date: 11/06/2023 CLINICAL DATA:  ?hemoptysis. H/o metastatic colon CA. * Tracking Code: BO * EXAM: CT CHEST, ABDOMEN AND PELVIS WITHOUT CONTRAST TECHNIQUE: Multidetector CT imaging of the chest, abdomen and pelvis was performed following the standard protocol  without IV contrast. RADIATION DOSE REDUCTION: This exam was performed according to the departmental dose-optimization program which includes automated exposure control, adjustment of the mA and/or kV according to patient size and/or use of iterative reconstruction technique. COMPARISON:  CT scan chest, abdomen and pelvis from 07/22/2023. FINDINGS: CT CHEST FINDINGS Cardiovascular: Normal cardiac size. No pericardial effusion. No aortic aneurysm. There are coronary artery calcifications, in keeping with coronary artery disease. There are also moderate to severe peripheral atherosclerotic vascular calcifications of thoracic aorta and its major branches. There is apparent hypoattenuation of the blood pool relative to the myocardium, suggestive of anemia. Mediastinum/Nodes: Visualized thyroid gland appears grossly unremarkable. No solid / cystic mediastinal masses. The esophagus is nondistended precluding optimal assessment. There is moderate circumferential thickening of the lower thoracic esophagus, which is most likely seen in the settings of chronic gastroesophageal reflux disease versus esophagitis. No mediastinal or axillary lymphadenopathy by size criteria. Evaluation of bilateral hila is limited due to lack on intravenous contrast: however, no large hilar lymphadenopathy identified. Lungs/Pleura: The central tracheo-bronchial tree is patent. Since the prior study, there are innumerable new, solid noncalcified nodules throughout bilateral lungs with largest in the left lung apex measuring up to 8 x 10 mm and largest in the right lung apex measuring up to 6 x 7 mm, compatible with worsening metastases. There is also trace right pleural effusion with associated compressive atelectatic changes in the right lung lower lobe. No left pleural effusion. No consolidation or pneumothorax on either side. Musculoskeletal: A CT Port-a-Cath is seen in  the right upper chest wall with the catheter terminating in the cavo-atrial  junction region. Visualized soft tissues of the chest wall are otherwise grossly unremarkable. No suspicious osseous lesions. There are mild multilevel degenerative changes in the visualized spine. CT ABDOMEN PELVIS FINDINGS Hepatobiliary: The liver is normal in size. There are innumerable heterogeneous hypoattenuating lesions throughout the liver occupying more than 50% of the total liver parenchyma, compatible with worsening hepatic metastases. 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 No intrahepatic or extrahepatic bile duct dilation. There are hyperattenuating areas within the gallbladder, which may represent gallbladder sludge. No imaging evidence of acute cholecystitis. Pancreas: No discrete focal pancreatic lesions seen within the limitations of this unenhanced exam. No peripancreatic fat stranding. Spleen: Within normal limits. No focal lesion. Adrenals/Urinary Tract: Adrenal glands are unremarkable. No suspicious renal mass within the limitations of this unenhanced exam. No hydroureteronephrosis or nephroureterolithiasis. Urinary bladder is under distended, precluding optimal assessment. However, no large mass or stones identified. No perivesical fat stranding. Stomach/Bowel: No disproportionate dilation of the small or large bowel loops. Redemonstration of heterogeneous hyperattenuating mass centered in the cecum. Vascular/Lymphatic: There is large ascites, significantly increased since the prior study. There are irregular soft tissue nodules throughout the abdomen, which may be due to ascites or due to peritoneal carcinomatosis. No pneumoperitoneum. Redemonstration of multiple prominent para-aortic lymph nodes, grossly similar to the prior study. No aneurysmal dilation of the major abdominal arteries. There are moderate peripheral atherosclerotic vascular calcifications of the aorta and its major branches. Reproductive: The uterus is unremarkable. No large adnexal mass. Other: The  visualized soft tissues and abdominal wall are unremarkable. Musculoskeletal: No suspicious osseous lesions. There are mild - moderate multilevel degenerative changes in the visualized spine. IMPRESSION: 1. Since the prior study, there is interval worsening of metastatic disease with innumerable new pulmonary nodules and innumerable hepatic lesions. There is also interval increase in the ascites. 2. There is new, trace right pleural effusion with associated compressive atelectatic changes in the right lung lower lobe. 3. Multiple other nonacute observations, as described above. Aortic Atherosclerosis (ICD10-I70.0). Electronically Signed   By: Jules Schick M.D.   On: 11/06/2023 11:33    Medications: I have reviewed the patient's current medications.  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  Cirrhosis.  Innumerable liver lesions. Colonoscopy 12/18/2019-ulcerated partially obstructing large mass in the mid ascending colon.  Biopsy invasive adenocarcinoma; preserved expression major MMR proteins Upper endoscopy 12/18/2019-normal esophagus, stomach, examined duodenum CEA 12/19/2019-8,923 Biopsy liver lesion 12/23/2019-adenocarcinoma Foundation 1-K-ras wild-type; tumor mutation burden greater than or equal to 10; microsatellite stable; BRAF V600E Cycle 1 FOLFOX 01/01/2020 Cycle 2 FOLFOX 01/15/2020  Cycle 3 FOLFOX 01/29/2020, Udenyca added Cycle 4 FOLFOX 02/12/2020, Udenyca held Cycle 5 FOLFOX 02/26/2020, Udenyca Cycle 6 FOLFOX 03/11/2020, Udenyca held CT abdomen/pelvis 03/16/2020-stable diffuse liver lesions, stable strictured appearing ascending colon, possible pericolonic implant, vague left lower lobe nodule Cycle 1 FOLFIRI/Avastin 04/06/2020 Cycle 2 FOLFIRI/Avastin 04/21/2020 Cycle 3 FOLFIRI/Avastin 05/06/2020 Cycle 4 FOLFIRI/Avastin 05/20/2020 Cycle 5 FOLFIRI/Avastin 06/03/2020 CTs  06/15/2020-mild to moderate improvement in hepatic metastasis.  No new or progressive disease.  Narrowing within the proximal ascending colon with upstream mild cecal and terminal ileum dilatation, similar. Cycle 6 FOLFIRI/Avastin 06/17/2020 Cycle 7 FOLFIRI/Avastin 07/01/2020 Cycle 8 FOLFIRI/Avastin 07/15/2020 Cycle 9 FOLFIRI/Avastin 07/29/2020-5-FU bolus eliminated, 5-FU infusion and irinotecan dose reduced Cycle 10 FOLFIRI/Avastin 08/12/2020 CTs 08/23/2020- mild residual wall thickening involving the cecum/ascending colon.  Numerous liver metastases improved. Cycle 11 FOLFIRI/Avastin 08/25/2020 Cycle 12 FOLFIRI/Avastin 09/16/2020 Cycle 13 FOLFIRI/Avastin 10/07/2020 Cycle 14 FOLFIRI/Avastin 10/28/2020 Cycle 15 FOLFIRI/Avastin 11/18/2020 CT abdomen/pelvis 12/06/2020-mild improvement in multifocal hepatic metastases, wall thickening at the ascending colon with pericolonic inflammatory changes Cycle 16 FOLFIRI/Avastin 12/09/2020 Cycle 17 FOLFIRI/Avastin 12/30/2020 Cycle 18 FOLFIRI/Avastin 01/20/2021 Cycle 19 FOLFIRI/Avastin 02/10/2021 Cycle 20 FOLFIRI/Avastin 03/03/2021 Cycle 21 FOLFIRI/Avastin 03/24/2021 Cycle 22 FOLFIRI/Avastin 04/14/2021 CTs 05/04/2021-slight improvement in hepatic metastatic disease. Cycle 23 FOLFIRI/Avastin 05/05/2021 Cycle 24 FOLFIRI/Avastin 05/25/2021 Cycle 25 FOLFIRI/Avastin 06/15/2021 Cycle 26 FOLFIRI/Avastin 07/13/2021 Cycle 27 FOLFIRI/Avastin 08/02/2021 CTs 08/30/2021-majority of liver lesions are unchanged in size, 1 lesion adjacent to the gallbladder has increased, no adenopathy, persistent pericolonic stranding and peritoneal thickening at the cecum Progressive rise in CEA 08/31/2021-treatment changed to Xeloda/bevacizumab, began [place] 09/22/2021 CEA stable 11/03/2021 Xeloda/bevacizumab continued CTs 01/10/2022-increase in size of some of the liver lesions, others are stable, new tiny right lower lobe nodule, increased dilation of distal small bowel with increased wall thickening at  the terminal ileum and ileocecal valve with 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, stable bilateral paracolic gutter peritoneal thickening Panitumumab/ Encorafenib 01/12/2022 (encorafenib 01/19/2022) Panitumumab 02/02/2022 Panitumumab 02/20/2022 Panitumumab 03/06/2022 Panitumumab 03/20/2022 Panitumumab 04/03/2022 CTs 04/14/2022-decrease size of liver metastases, new borderline subcarinal adenopathy, improved peritoneal thickening, cirrhosis or pseudocirrhosis, persistent dilated cecum Encorafenib and Panitumumab continued Panitumumab 04/18/2022 Panitumumab 05/05/2022 Panitumumab 05/19/2022 Panitumumab 06/02/2022 Panitumumab 06/16/2022, 4g IV mag and begin oral mag po once daily  Panitumumab 06/30/2022 Panitumumab 07/14/2022, 2 g IV magnesium, increase oral magnesium to 400 mg twice daily Panitumumab 07/28/2022, 2 g IV magnesium CTs 07/28/2022-no change in hepatic metastases, no new signs of metastatic disease, persistent mural thickening at the terminal ileum/cecum mild splenomegaly unchanged Panitumumab 08/18/2022 Panitumumab 09/01/2022 Panitumumab 09/15/2022 Panitumumab 09/29/2022 Panitumumab 10/13/2022 Panitumumab 10/27/2022 CTs 11/03/2022-unchanged hepatic metastases, no new lesions, 3.7 cm soft tissue density at the medial left kidney etiology unclear, improvement in wall thickening at the ileocecal junction development of appendiceal dilatation Panitumumab 11/10/2022 Panitumumab 11/24/2022 Panitumumab 12/08/2022 Panitumumab 12/22/2022 Panitumumab 01/05/2023 Panitumumab 02/02/2023 Panitumumab 02/16/2023 CTs 03/01/2023-unchanged cecal mass. Enlargement of left liver lesion, other liver lesions unchanged. Near complete resolution of soft tissue left renal pelvis. No pericardial effusion. Panitumumab 03/02/2023 Panitumumab 03/16/2023 Panitumumab 03/30/2023 Panitumumab 04/13/2023 Panitumumab 04/27/2023 Panitumumab 05/11/2023 CTs 05/19/2023-no evidence of metastatic disease in the chest, progressive porta  hepatis adenopathy and enlargement of a coalescent left liver lesion, possible mildly enlarging left liver lesion, right colon mass less obvious-slightly improved? Panitumumab 05/25/2023, Encorafenib continued Panitumumab 06/08/2023 Panitumumab 06/26/2023 Panitumumab 07/06/2023 Panitumumab 07/20/2023 CTs 07/22/2023-new and enlarged liver lesions.  Continued enlargement of metastatic porta hepatis and portacaval lymph nodes.  Newly enlarged retroperitoneal lymph nodes. Change in treatment to Lonsurf/Avastin-anticipate start date Lonsurf 08/14/2023, discontinued 08/15/2022 Cycle 1 FOLFOX 09/04/2023 Cycle 2 FOLFOX/bevacizumab 09/19/2023-oxaliplatin stopped about 30 minutes into the infusion due to a reaction (pruritus, rash, generalized weakness, dyspnea). Cycle three 5-FU/bevacizumab 10/03/2023 Treatment change due to no clinical improvement, allergic reaction to oxaliplatin Paracentesis 10/17/2018 25-2.1 L of fluid removed, cytology positive for malignant cells  cycle 1 Lonsurf/Avastin 10/22/2023 CTs 11/06/2023: Progressive metastatic disease with new lung nodules, increased hepatic lesions, and ascites Cirrhosis 12/26/2019-hospital admission for right lower extremity DVT and GI bleed, treated with heparin anticoagulation beginning 12/26/2019, converted to Lovenox at discharge 12/29/2019 Lovenox reduced to 40 mg daily 02/10/2021 secondary to bruising and nosebleeding History of low-grade fever-likely "tumor" fever COVID-19 infection January 2021 Admission 01/19/2023 with dyspnea/orthopnea, pericardial effusion, EKG changes suggestive of pericarditis Echocardiogram 01/20/2023-LVEF 60-65%, small to  moderate circumferential pericardial effusion without tamponade physiology Echocardiogram 02/19/2022-LVEF 70-75%; trivial pericardial effusion. Anemia secondary to chemotherapy, chronic disease, and phlebotomy 2 units of packed red blood cells 02/03/2023 9.  Admission 11/06/2023 with a report of hematemesis/rectal bleeding  and acute renal failure    Stacy Burns has metastatic colon cancer initially diagnosed in May 2021.  She now has clinical and radiologic evidence of disease progression.  She has failure to thrive, altered mental status, and renal failure.  Stacy Burns has a poor prognosis for survival beyond days.  She was alert and slightly confused when I saw her this morning.  She made it clear that she wishes to be placed on a no CODE BLUE status and agrees to hospice care.  She said she would agree to answer to [facility].  I discussed the case with her daughter Stacy Burns.  I explained the poor prognosis and likelihood of a hospital death.  Stacy Burns understands and is in agreement with comfort care.  Recommendations:  No CODE BLUE Comfort measures  LOS: 1 day   Thornton Papas, MD   11/04/2023, 1:39 PM

## 2023-11-13 NOTE — Assessment & Plan Note (Signed)
 11/04/2023 now terminal care. Continue with IV opiates prn.

## 2023-11-13 NOTE — Assessment & Plan Note (Signed)
 11/04/2023 now terminal care.

## 2023-11-13 NOTE — TOC Initial Note (Signed)
 Transition of Care [facility]) - Initial/Assessment Note    Patient Details  Name: Stacy Burns MRN: [id number] Date of Birth: August 21, 1945  Transition of Care [facility]) CM/SW Contact:    Larrie Kass, LCSW Phone Number: 10/22/2023, 12:06 PM  Clinical Narrative:                   Per chart review pt is being evaluated for [facility], TOC to follow for D/c needs.   Expected Discharge Plan: Hospice Medical Facility Barriers to Discharge: Continued Medical Work up   Patient Goals and CMS Choice            Expected Discharge Plan and Services                                              Prior Living Arrangements/Services                       Activities of Daily Living   ADL Screening (condition at time of admission) Independently performs ADLs?: Yes (appropriate for developmental age) Is the patient deaf or have difficulty hearing?: No Does the patient have difficulty seeing, even when wearing glasses/contacts?: No Does the patient have difficulty concentrating, remembering, or making decisions?: Yes  Permission Sought/Granted                  Emotional Assessment              Admission diagnosis:  Melena [K92.1] Gastrointestinal hemorrhage, unspecified gastrointestinal hemorrhage type [K92.2] Carcinoma metastatic to colon [facility]) [C78.5] Patient Active Problem List   Diagnosis Date Noted   Melena 11/06/2023   Acute on chronic blood loss anemia 11/06/2023   Urinary tract infectious disease 11/06/2023   AKI (acute kidney injury) (HCC) 11/06/2023   Protein-calorie malnutrition, severe (HCC) 11/06/2023   Hypocarbia 11/06/2023   Cancer associated pain 11/06/2023   Counseling and coordination of care 11/06/2023   Palliative care encounter 11/06/2023   Hypoglycemia 11/06/2023   Hypomagnesemia 11/06/2023   Precordial pain 01/20/2023   Acute pericarditis 01/20/2023   Shortness of breath 01/20/2023   Atherosclerosis of aorta (HCC)  01/20/2023   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) 01/20/2023   History of DVT (deep vein thrombosis) 01/20/2023   Former smoker 01/20/2023   Pericardial effusion 01/19/2023   Port-A-Cath in place 02/12/2020   Goals of care, counseling/discussion 12/29/2019   Cancer of ascending colon (HCC) 12/29/2019   Acute DVT (deep venous thrombosis) (HCC) 12/26/2019   Colonic mass    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   Metastatic disease (HCC) 12/17/2019   GI bleed 12/17/2019   Anemia 12/17/2019   Symptomatic anemia 12/15/2019   Hypokalemia 12/15/2019   Positive fecal occult blood test 12/15/2019   Leukocytosis 12/15/2019   Bilateral lower extremity edema 12/15/2019   Abnormal EKG 12/15/2019   Elevated blood pressure reading 12/15/2019   PCP:  Lance Bosch, NP Pharmacy:   [facility] [phone] [place] Otto, [place] - [address] AT [facility] [address] [place] [address] Phone: [phone] Fax: [phone]  [facility] - [place], [place] - [address] [facility] [address] [place] [address] Phone: [phone] Fax: [phone]  [facility] [address] [place] [address] Phone: [phone] Fax: [phone]  [id number] - [place], [place] - [address] [place] [address] Phone: [phone] Fax: [phone]  [facility] [address] [place] - [address] [place] [address] Phone: [phone] Fax: [phone]     Social Drivers of Health (SDOH) Social History: SDOH Screenings   Food Insecurity: No Food Insecurity (11/06/2023)  Housing: Low Risk  (11/06/2023)  Transportation Needs: No Transportation Needs (11/06/2023)  Utilities: Not At Risk (11/06/2023)  Social Connections: Socially Integrated (11/06/2023)  Tobacco Use: Medium Risk (11/06/2023)   SDOH Interventions:     Readmission Risk Interventions     No data to display

## 2023-11-13 NOTE — Assessment & Plan Note (Signed)
 10/20/2023 Likely due to the patient's acute GI bleeding.  With her metabolic acidosis and her hyperkalemia, she is likely actively dying.  She is now terminal care.  Stop checking labs.

## 2023-11-13 NOTE — Death Summary Note (Addendum)
 DEATH SUMMARY   Patient Details  Name: Stacy Burns MRN: [id number] DOB: 12-09-1945 GEX:[id number], Stacy Lush, NP Admission/Discharge Information   Admit Date:  12-04-2023  Date of Death: 2501-12-04/26/25   Time of Death: 1700  Length of Stay: 1   Principle Cause of death: metastatic colon cancer  Hospital Diagnoses: Principal Problem:   Melena Active Problems:   Transition from acute care to hospice   GI bleed   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)   Acute on chronic blood loss anemia   AKI (acute kidney injury) (HCC)   Hypoglycemia   Hypomagnesemia   Hyperkalemia   Acute metabolic acidosis   Hypokalemia   History of DVT (deep vein thrombosis)   Protein-calorie malnutrition, severe (HCC)   Cancer associated pain   Counseling and coordination of care   Palliative care encounter   Carcinoma metastatic to colon [facility])   Need for emotional support   DNR (do not resuscitate)   Concern about end of life   High risk medication use   Advance care planning   Medication management   Acute renal failure [facility])   Hospital Course: HPI: Stacy Burns is a 78 y.o. female with medical history significant of metastatic colon cancer, COVID-19, aortic atherosclerosis, chronic blood loss anemia, history of DVT, history of GI bleed, hepatic cirrhosis, history of UTI who presented to the emergency department with complaints of melena and hematemesis for the past 5 days.   No  diarrhea, constipation or hematochezia.She saw oncology today who counseled her against further therapy.  They recommended palliative care evaluation.  She denied fever, chills, rhinorrhea, sore throat or wheezing no chest pain, palpitations, diaphoresis, PND, orthopnea or pitting edema of the lower extremities.   No flank pain, dysuria, frequency or hematuria.  No polyuria, polydipsia, polyphagia or blurred vision.      ED course: Initial vital signs were temperature 95.8 F, pulse 71, respiration 15, BP 91/48 mmHg O2  sat 100% on room air.  The patient received Protonix 40 mg IVP, tranexamic acid 1000 mg IVP, ondansetron 4 mg IVP, ciprofloxacin 200 mg IVP, calcium gluconate 1000 mg IVP, octreotide 50 mcg IVP and was started on a octreotide infusion.  Significant Events: Admitted 12/04/23 for GI bleeding   Significant Labs:  CBC showed a white count 13.0 with 83% neutrophils, hemoglobin 9.5 g/dL and platelets 440.  Most recent hemoglobin was 10.0 g/dL 2 weeks ago.   CMP showed a sodium 139, potassium 3.0, chloride 125 and CO2 8 mmol/L with an anion gap of 6.  Lactic acid was 2.6 mmol/L.  Glucose 48, BUN 61, creatinine 2.73 and calcium less than 4.0 mg/dL.   Total protein is less than 3.0 and albumin less than 1.5 g/dL.  AST 44, ALT 15 and alkaline phosphatase under 47 units/L.  Total bilirubin was normal.    Significant Imaging Studies: CT chest/abdomen/pelvis without contrast showed interval worsening of metastatic disease with innumerable new pulmonary nodules and innumerable hepatic lesions. There is also increasing ascites. New, trace right pleural effusion with associated compressive atelectatic changes in the right lower lobe.   Antibiotic Therapy: Anti-infectives (From admission, onward)    Start     Dose/Rate Route Frequency Ordered Stop   12-05-2023 0800  ciprofloxacin (CIPRO) IVPB 200 mg        200 mg 100 mL/hr over 60 Minutes Intravenous Every 24 hours 12/04/23 2347     2023-12-04 0745  ciprofloxacin (CIPRO) IVPB 200 mg  200 mg 100 mL/hr over 60 Minutes Intravenous  Once 11/06/23 0741 11/06/23 0944       Procedures:   Consultants: Palliative Care GI oncology   Assessment and Plan: * Melena 05-Dec-2023 likely due to her colon cancer.  Patient is now DNR comfort care.  Stop checking labs.  Transition from acute care to hospice 12-05-23 pt accepted to [facility] for inpatient hospice. Will leave her port-a-cath accessed so pt can received IV meds.    **Pt expired prior  to being discharged to inpatient hospice. Pt died at 1700 on 12-05-2023.  Acute metabolic acidosis 12/05/2023 Likely due to the patient's acute GI bleeding.  With her metabolic acidosis and her hyperkalemia, she is likely actively dying.  She is now terminal care.  Stop checking labs.  Hyperkalemia 05-Dec-2023 now terminal care.  Stop checking labs.  Hypomagnesemia 2023/12/05 now terminal care.  Stop checking labs.  Hypoglycemia December 05, 2023 now terminal care.  Stop checking labs.  AKI (acute kidney injury) (HCC) 2023-12-05 now terminal care.  Stop checking labs.  Acute on chronic blood loss anemia 2023-12-05 due to metastatic colon cancer.  Colon cancer metastasized to liver [facility]) 05-Dec-2023 terminal care.  Hospice and oncology involved.  GI bleed 2023-12-05 due to metastatic colon cancer.  Cancer associated pain 12-05-23 now terminal care. Continue with IV opiates prn.  Protein-calorie malnutrition, severe (HCC) 2023-12-05 now terminal care.  History of DVT (deep vein thrombosis) 2023/12/05 now terminal care.  Hypokalemia 12-05-23 now terminal care.  Stop checking labs.    Consultations: palliative care, oncology, GI  The results of significant diagnostics from this hospitalization (including imaging, microbiology, ancillary and laboratory) are listed below for reference.   Significant Diagnostic Studies: CT CHEST ABDOMEN PELVIS WO CONTRAST Result Date: 11/06/2023 CLINICAL DATA:  ?hemoptysis. H/o metastatic colon CA. * Tracking Code: BO * EXAM: CT CHEST, ABDOMEN AND PELVIS WITHOUT CONTRAST TECHNIQUE: Multidetector CT imaging of the chest, abdomen and pelvis was performed following the standard protocol without IV contrast. RADIATION DOSE REDUCTION: This exam was performed according to the departmental dose-optimization program which includes automated exposure control, adjustment of the mA and/or kV according to patient size and/or use of iterative reconstruction  technique. COMPARISON:  CT scan chest, abdomen and pelvis from 07/22/2023. FINDINGS: CT CHEST FINDINGS Cardiovascular: Normal cardiac size. No pericardial effusion. No aortic aneurysm. There are coronary artery calcifications, in keeping with coronary artery disease. There are also moderate to severe peripheral atherosclerotic vascular calcifications of thoracic aorta and its major branches. There is apparent hypoattenuation of the blood pool relative to the myocardium, suggestive of anemia. Mediastinum/Nodes: Visualized thyroid gland appears grossly unremarkable. No solid / cystic mediastinal masses. The esophagus is nondistended precluding optimal assessment. There is moderate circumferential thickening of the lower thoracic esophagus, which is most likely seen in the settings of chronic gastroesophageal reflux disease versus esophagitis. No mediastinal or axillary lymphadenopathy by size criteria. Evaluation of bilateral hila is limited due to lack on intravenous contrast: however, no large hilar lymphadenopathy identified. Lungs/Pleura: The central tracheo-bronchial tree is patent. Since the prior study, there are innumerable new, solid noncalcified nodules throughout bilateral lungs with largest in the left lung apex measuring up to 8 x 10 mm and largest in the right lung apex measuring up to 6 x 7 mm, compatible with worsening metastases. There is also trace right pleural effusion with associated compressive atelectatic changes in the right lung lower lobe. No left pleural effusion. No consolidation or pneumothorax on either side. Musculoskeletal: A CT Port-a-Cath  is seen in the right upper chest wall with the catheter terminating in the cavo-atrial junction region. Visualized soft tissues of the chest wall are otherwise grossly unremarkable. No suspicious osseous lesions. There are mild multilevel degenerative changes in the visualized spine. CT ABDOMEN PELVIS FINDINGS Hepatobiliary: The liver is normal in  size. There are innumerable heterogeneous hypoattenuating lesions throughout the liver occupying more than 50% of the total liver parenchyma, compatible with worsening hepatic metastases. 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 No intrahepatic or extrahepatic bile duct dilation. There are hyperattenuating areas within the gallbladder, which may represent gallbladder sludge. No imaging evidence of acute cholecystitis. Pancreas: No discrete focal pancreatic lesions seen within the limitations of this unenhanced exam. No peripancreatic fat stranding. Spleen: Within normal limits. No focal lesion. Adrenals/Urinary Tract: Adrenal glands are unremarkable. No suspicious renal mass within the limitations of this unenhanced exam. No hydroureteronephrosis or nephroureterolithiasis. Urinary bladder is under distended, precluding optimal assessment. However, no large mass or stones identified. No perivesical fat stranding. Stomach/Bowel: No disproportionate dilation of the small or large bowel loops. Redemonstration of heterogeneous hyperattenuating mass centered in the cecum. Vascular/Lymphatic: There is large ascites, significantly increased since the prior study. There are irregular soft tissue nodules throughout the abdomen, which may be due to ascites or due to peritoneal carcinomatosis. No pneumoperitoneum. Redemonstration of multiple prominent para-aortic lymph nodes, grossly similar to the prior study. No aneurysmal dilation of the major abdominal arteries. There are moderate peripheral atherosclerotic vascular calcifications of the aorta and its major branches. Reproductive: The uterus is unremarkable. No large adnexal mass. Other: The visualized soft tissues and abdominal wall are unremarkable. Musculoskeletal: No suspicious osseous lesions. There are mild - moderate multilevel degenerative changes in the visualized spine. IMPRESSION: 1. Since the prior study, there is interval worsening of  metastatic disease with innumerable new pulmonary nodules and innumerable hepatic lesions. There is also interval increase in the ascites. 2. There is new, trace right pleural effusion with associated compressive atelectatic changes in the right lung lower lobe. 3. Multiple other nonacute observations, as described above. Aortic Atherosclerosis (ICD10-I70.0). Electronically Signed   By: Jules Schick M.D.   On: 11/06/2023 11:33   US Paracentesis Result Date: 10/23/2023 INDICATION: Patient with history of metastatic colon cancer, anemia, recurrent malignant ascites. Request received for therapeutic paracentesis. EXAM: ULTRASOUND GUIDED THERAPEUTIC PARACENTESIS MEDICATIONS: 8 ml 1 % lidocaine COMPLICATIONS: None immediate. PROCEDURE: Informed written consent was obtained from the patient after a discussion of the risks, benefits and alternatives to treatment. A timeout was performed prior to the initiation of the procedure. Initial ultrasound scanning demonstrates a moderate to large amount of ascites within the right lower abdominal quadrant. The right lower abdomen was prepped and draped in the usual sterile fashion. 1% lidocaine was used for local anesthesia. Following this, a 19 gauge, 7-cm, Yueh catheter was introduced. An ultrasound image was saved for documentation purposes. The paracentesis was performed. The catheter was removed and a dressing was applied. The patient tolerated the procedure well without immediate post procedural complication. FINDINGS: A total of approximately 3.1 liters of yellow fluid was removed. IMPRESSION: Successful ultrasound-guided therapeutic paracentesis yielding 3.1 liters of peritoneal fluid. Performed by: Artemio Aly Electronically Signed   By: Roanna Banning M.D.   On: 10/23/2023 16:32   IR Paracentesis Result Date: 10/17/2023 INDICATION: Patient with history of metastatic colon cancer, anemia, ascites. Request received for diagnostic and therapeutic paracentesis.  EXAM: ULTRASOUND GUIDED DIAGNOSTIC AND THERAPEUTIC PARACENTESIS MEDICATIONS: 8 mL 1%  lidocaine COMPLICATIONS: None immediate. PROCEDURE: Informed written consent was obtained from the patient after a discussion of the risks, benefits and alternatives to treatment. A timeout was performed prior to the initiation of the procedure. Initial ultrasound scanning demonstrates a small to moderate amount of ascites within the left mid to lower abdominal quadrant. The left mid to lower abdomen was prepped and draped in the usual sterile fashion. 1% lidocaine was used for local anesthesia. Following this, a 19 gauge, 7-cm, Yueh catheter was introduced. An ultrasound image was saved for documentation purposes. The paracentesis was performed. The catheter was removed and a dressing was applied. The patient tolerated the procedure well without immediate post procedural complication. FINDINGS: A total of approximately 2.1 liters of yellow fluid was removed. Samples were sent to the laboratory as requested by the clinical team. IMPRESSION: Successful ultrasound-guided diagnostic and therapeutic paracentesis yielding 2.1 liters of peritoneal fluid. Performed by: Artemio Aly Electronically Signed   By: Malachy Moan M.D.   On: 10/17/2023 14:40    Microbiology: Recent Results (from the past 240 hours)  Culture, blood (routine x 2)     Status: None (Preliminary result)   Collection Time: 11/06/23  9:05 AM   Specimen: BLOOD  Result Value Ref Range Status   Specimen Description   Final    BLOOD BLOOD RIGHT FOREARM Performed at [facility], [address], [place], [place] [address]    Special Requests   Final    BOTTLES DRAWN AEROBIC AND ANAEROBIC Blood Culture results may not be optimal due to an inadequate volume of blood received in culture bottles Performed at [facility], [address] [place], [place] [address]    Culture   Final    NO GROWTH < 24  HOURS Performed at [facility], [address] [place], [place] [address]    Report Status PENDING  Incomplete  Culture, blood (routine x 2)     Status: None (Preliminary result)   Collection Time: 11/06/23  9:15 AM   Specimen: BLOOD  Result Value Ref Range Status   Specimen Description   Final    BLOOD BLOOD LEFT FOREARM Performed at [facility], [address] [place], [place] [address]    Special Requests   Final    BOTTLES DRAWN AEROBIC ONLY Blood Culture results may not be optimal due to an inadequate volume of blood received in culture bottles Performed at [facility], [address] [place], [place] [address]    Culture   Final    NO GROWTH < 24 HOURS Performed at [facility], [address], [place], [place] [address]    Report Status PENDING  Incomplete    Time spent: 45 minutes  Signed: Carollee Herter, DO 11/05/2023 5:22 PM

## 2023-11-13 NOTE — Discharge Summary (Addendum)
 [facility] Physician Discharge Summary   Patient name: Stacy Burns  Admit date:     11/06/2023  Discharge date: 10/30/2023  Attending Physician: Bobette Mo [[id number]]  Discharge Physician: Carollee Herter   PCP: Lance Bosch, NP  Admitted From: Home  Disposition:  Residential Hospice  Home Health:No Equipment/Devices: None    Discharge Condition:Hospice CODE STATUS:Comfort Care Diet recommendation:  full liquid diet Fluid Restriction: None  Hospital Summary: HPI: Stacy Burns is a 78 y.o. female with medical history significant of metastatic colon cancer, COVID-19, aortic atherosclerosis, chronic blood loss anemia, history of DVT, history of GI bleed, hepatic cirrhosis, history of UTI who presented to the emergency department with complaints of melena and hematemesis for the past 5 days.   No  diarrhea, constipation or hematochezia.She saw oncology today who counseled her against further therapy.  They recommended palliative care evaluation.  She denied fever, chills, rhinorrhea, sore throat or wheezing no chest pain, palpitations, diaphoresis, PND, orthopnea or pitting edema of the lower extremities.   No flank pain, dysuria, frequency or hematuria.  No polyuria, polydipsia, polyphagia or blurred vision.      ED course: Initial vital signs were temperature 95.8 F, pulse 71, respiration 15, BP 91/48 mmHg O2 sat 100% on room air.  The patient received Protonix 40 mg IVP, tranexamic acid 1000 mg IVP, ondansetron 4 mg IVP, ciprofloxacin 200 mg IVP, calcium gluconate 1000 mg IVP, octreotide 50 mcg IVP and was started on a octreotide infusion.  Significant Events: Admitted 11/06/2023 for GI bleeding   Significant Labs:  CBC showed a white count 13.0 with 83% neutrophils, hemoglobin 9.5 g/dL and platelets 951.  Most recent hemoglobin was 10.0 g/dL 2 weeks ago.   CMP showed a sodium 139, potassium 3.0, chloride 125 and CO2 8 mmol/L with an anion gap of 6.  Lactic acid was  2.6 mmol/L.  Glucose 48, BUN 61, creatinine 2.73 and calcium less than 4.0 mg/dL.   Total protein is less than 3.0 and albumin less than 1.5 g/dL.  AST 44, ALT 15 and alkaline phosphatase under 47 units/L.  Total bilirubin was normal.    Significant Imaging Studies: CT chest/abdomen/pelvis without contrast showed interval worsening of metastatic disease with innumerable new pulmonary nodules and innumerable hepatic lesions. There is also increasing ascites. New, trace right pleural effusion with associated compressive atelectatic changes in the right lower lobe.   Antibiotic Therapy: Anti-infectives (From admission, onward)    Start     Dose/Rate Route Frequency Ordered Stop   11/08/2023 0800  ciprofloxacin (CIPRO) IVPB 200 mg        200 mg 100 mL/hr over 60 Minutes Intravenous Every 24 hours 11/06/23 2347     11/06/23 0745  ciprofloxacin (CIPRO) IVPB 200 mg        200 mg 100 mL/hr over 60 Minutes Intravenous  Once 11/06/23 0741 11/06/23 0944       Procedures:   Consultants: Palliative Care GI oncology   Hospital Course by Problem: * Melena 10/13/2023 likely due to her colon cancer.  Patient is now DNR comfort care.  Stop checking labs.  Transition from acute care to hospice 10/30/2023 pt accepted to [facility] for inpatient hosp[facility]. Will leave her port-a-cath accessed so pt can received IV meds.    Acute metabolic acidosis 10/21/2023 Likely due to the patient's acute GI bleeding.  With her metabolic acidosis and her hyperkalemia, she is likely actively dying.  She is now terminal care.  Stop checking labs.  Hyperkalemia 10/24/2023 now terminal care.  Stop checking labs.  Hypomagnesemia 11/12/2023 now terminal care.  Stop checking labs.  Hypoglycemia 10/25/2023 now terminal care.  Stop checking labs.  AKI (acute kidney injury) (HCC) 10/17/2023 now terminal care.  Stop checking labs.  Acute on chronic blood loss anemia 11/12/2023 due to metastatic colon  cancer.  Colon cancer metastasized to liver [facility]) 10/26/2023 terminal care.  Hospice and oncology involved.  GI bleed 10/30/2023 due to metastatic colon cancer.  Cancer associated pain 11/01/2023 now terminal care. Continue with IV opiates prn.  Protein-calorie malnutrition, severe (HCC) 10/26/2023 now terminal care.  History of DVT (deep vein thrombosis) 11/08/2023 now terminal care.  Hypokalemia 10/15/2023 now terminal care.  Stop checking labs.    Discharge Diagnoses:  Principal Problem:   Melena Active Problems:   Transition from acute care to hospice   GI bleed   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)   Acute on chronic blood loss anemia   AKI (acute kidney injury) (HCC)   Hypoglycemia   Hypomagnesemia   Hyperkalemia   Acute metabolic acidosis   Hypokalemia   History of DVT (deep vein thrombosis)   Protein-calorie malnutrition, severe (HCC)   Cancer associated pain   Counseling and coordination of care   Palliative care encounter   Carcinoma metastatic to colon [facility])   Need for emotional support   DNR (do not resuscitate)   Concern about end of life   High risk medication use   Advance care planning   Medication management   Acute renal failure [facility])   Discharge Instructions  Discharge Instructions     Diet full liquid   Complete by: As directed       Allergies as of 10/14/2023       Reactions   Milk-related Compounds Nausea Only, Other (See Comments)   Delayed reaction if too much consumed    Penicillins Anaphylaxis, Other (See Comments)   Both parents had anaphylactic reactions to this   Pineapple Swelling, Other (See Comments)   Tongue swells and causes acid bumps there, also   Morphine Sulfate Other (See Comments)   Patient received IV morphine on 08/17/23. Patient had troubles speaking clearly (word salad). See progress note from 08/17/23   Oxaliplatin Nausea And Vomiting, Rash, Other (See Comments)   Pt noted to be flushed on face, arms,  chest and hands and felt very warm.Rash noted on arms and palms of hands.   Chocolate Other (See Comments)   Nasal congestion   Hydrocodone Nausea And Vomiting   Lipitor [atorvastatin] Other (See Comments)   Bilateral shoulder aches.   Other Itching, Rash, Other (See Comments)   "Most all antibiotics and OTC cold meds break me out" Allergic to ALL NUTS, also = Itching   Sulfa Antibiotics Rash, Other (See Comments)   Severe rash        Medication List     STOP taking these medications    acetaminophen 500 MG tablet Commonly known as: TYLENOL   albuterol 108 (90 Base) MCG/ACT inhaler Commonly known as: VENTOLIN HFA   Cipro 500 MG tablet Generic drug: ciprofloxacin   cyanocobalamin 1000 MCG tablet Commonly known as: VITAMIN B12   enoxaparin 40 MG/0.4ML injection Commonly known as: LOVENOX   FeroSul 325 (65 Fe) MG tablet Generic drug: ferrous sulfate   lidocaine-prilocaine cream Commonly known as: EMLA   magnesium oxide 400 (240 Mg) MG tablet Commonly known as: MAG-OX   oxyCODONE 5 MG immediate release tablet Commonly known as: Oxy  IR/ROXICODONE   pantoprazole 40 MG tablet Commonly known as: PROTONIX   potassium chloride 10 MEQ tablet Commonly known as: KLOR-CON   prochlorperazine 10 MG tablet Commonly known as: COMPAZINE   trifluridine-tipiracil 20-8.19 MG tablet Commonly known as: LONSURF   Vitamin D3 25 MCG (1000 UT) Caps       TAKE these medications    glycopyrrolate 0.2 MG/ML injection Commonly known as: ROBINUL Inject 1 mL (0.2 mg total) into the vein every 4 (four) hours as needed (excessive secretions).   haloperidol lactate 5 MG/ML injection Commonly known as: HALDOL Inject 0.1 mLs (0.5 mg total) into the vein every 4 (four) hours as needed (or delirium).   HYDROmorphone 1 MG/ML injection Commonly known as: DILAUDID Inject 0.5-2 mLs (0.5-2 mg total) into the vein every 30 (thirty) minutes as needed for severe pain (pain score 7-10) (To  alleviate signs and symptoms of distress).   LORazepam 2 MG/ML injection Commonly known as: ATIVAN Inject 0.25 mLs (0.5 mg total) into the vein every 2 (two) hours as needed for anxiety (agitation).   ondansetron 4 MG/2ML Soln injection Commonly known as: ZOFRAN Inject 2 mLs (4 mg total) into the vein every 6 (six) hours as needed for nausea.        Allergies  Allergen Reactions   Milk-Related Compounds Nausea Only and Other (See Comments)    Delayed reaction if too much consumed    Penicillins Anaphylaxis and Other (See Comments)    Both parents had anaphylactic reactions to this   Pineapple Swelling and Other (See Comments)    Tongue swells and causes acid bumps there, also   Morphine Sulfate Other (See Comments)    Patient received IV morphine on 08/17/23. Patient had troubles speaking clearly (word salad). See progress note from 08/17/23   Oxaliplatin Nausea And Vomiting, Rash and Other (See Comments)    Pt noted to be flushed on face, arms, chest and hands and felt very warm.Rash noted on arms and palms of hands.   Chocolate Other (See Comments)    Nasal congestion   Hydrocodone Nausea And Vomiting   Lipitor [Atorvastatin] Other (See Comments)    Bilateral shoulder aches.   Other Itching, Rash and Other (See Comments)    "Most all antibiotics and OTC cold meds break me out" Allergic to ALL NUTS, also = Itching   Sulfa Antibiotics Rash and Other (See Comments)    Severe rash    Discharge Exam: Vitals:   10/18/2023 0529 10/19/2023 0813  BP: (!) 84/49 (!) 94/49  Pulse: 71 72  Resp: 14 18  Temp: (!) 97.5 F (36.4 C) (!) 97.5 F (36.4 C)  SpO2: 93% 98%    Physical Exam Vitals and nursing note reviewed.  Constitutional:      Comments: Appear chronically and acutely ill  HENT:     Head: Normocephalic and atraumatic.  Cardiovascular:     Rate and Rhythm: Normal rate and regular rhythm.  Pulmonary:     Effort: Pulmonary effort is normal. No respiratory distress.      Breath sounds: Normal breath sounds.  Abdominal:     Comments: Generalized tenderness. No rebound  Skin:    General: Skin is warm and dry.     Capillary Refill: Capillary refill takes less than 2 seconds.  Neurological:     Mental Status: She is disoriented.     The results of significant diagnostics from this hospitalization (including imaging, microbiology, ancillary and laboratory) are listed below for reference.  Microbiology: Recent Results (from the past 240 hours)  Culture, blood (routine x 2)     Status: None (Preliminary result)   Collection Time: 11/06/23  9:05 AM   Specimen: BLOOD  Result Value Ref Range Status   Specimen Description   Final    BLOOD BLOOD RIGHT FOREARM Performed at [facility], [address] [place], [place] [address]    Special Requests   Final    BOTTLES DRAWN AEROBIC AND ANAEROBIC Blood Culture results may not be optimal due to an inadequate volume of blood received in culture bottles Performed at [facility], [address], [place], [place] [address]    Culture   Final    NO GROWTH < 24 HOURS Performed at [facility], [address], [place], [place] [address]    Report Status PENDING  Incomplete  Culture, blood (routine x 2)     Status: None (Preliminary result)   Collection Time: 11/06/23  9:15 AM   Specimen: BLOOD  Result Value Ref Range Status   Specimen Description   Final    BLOOD BLOOD LEFT FOREARM Performed at [facility], [address], [place], [place] [address]    Special Requests   Final    BOTTLES DRAWN AEROBIC ONLY Blood Culture results may not be optimal due to an inadequate volume of blood received in culture bottles Performed at [facility], [address] [place], [place] [address]    Culture   Final    NO GROWTH < 24 HOURS Performed at [facility], [address], [place], [place] [address]    Report Status PENDING   Incomplete     Labs: BNP (last 3 results) Recent Labs    01/19/23 1923  BNP 114.9*   Basic Metabolic Panel: Recent Labs  Lab 11/06/23 0725 10/23/2023 0429 10/16/2023 0700 11/08/2023 0946  NA 139 126* 127* 125*  K 3.0* >7.5* >7.5* >7.5*  CL 125* 101 101 98  CO2 8* 14* 15* 15*  GLUCOSE 48* 111* 96 113*  BUN 61* 122* 117* 123*  CREATININE 2.73* 6.55* 6.53* 6.59*  CALCIUM <4.0* 7.6* 7.3* 7.5*  MG 1.2*  --   --   --   PHOS 2.6  --   --   --    Liver Function Tests: Recent Labs  Lab 11/06/23 0725 10/26/2023 0429 10/30/2023 0700  AST 44* 91* 81*  ALT 15 30 28   ALKPHOS 147* 333* 313*  BILITOT 0.7 1.7* 1.6*  PROT <3.0* 5.4* 5.0*  ALBUMIN <1.5* 1.9* 1.8*   CBC: Recent Labs  Lab 11/06/23 0725 10/28/2023 0248  WBC 13.0* 10.7*  NEUTROABS 10.8*  --   HGB 9.5* 7.8*  HCT 31.2* 25.7*  MCV 88.6 88.6  PLT 185 159   CBG: Recent Labs  Lab 11/06/23 1320  GLUCAP 82   Urinalysis    Component Value Date/Time   COLORURINE YELLOW 12/22/2021 1027   APPEARANCEUR CLEAR 12/22/2021 1027   LABSPEC 1.029 12/22/2021 1027   PHURINE 6.0 12/22/2021 1027   GLUCOSEU NEGATIVE 12/22/2021 1027   HGBUR TRACE (A) 12/22/2021 1027   BILIRUBINUR NEGATIVE 12/22/2021 1027   KETONESUR NEGATIVE 12/22/2021 1027   PROTEINUR 100 (A) 10/22/2023 0909   NITRITE NEGATIVE 12/22/2021 1027   LEUKOCYTESUR NEGATIVE 12/22/2021 1027   Sepsis Labs Recent Labs  Lab 11/06/23 0725 11/08/2023 0248  WBC 13.0* 10.7*    Procedures/Studies: CT CHEST ABDOMEN PELVIS WO CONTRAST Result Date: 11/06/2023  CLINICAL DATA:  ?hemoptysis. H/o metastatic colon CA. * Tracking Code: BO * EXAM: CT CHEST, ABDOMEN AND PELVIS WITHOUT CONTRAST TECHNIQUE: Multidetector CT imaging of the chest, abdomen and pelvis was performed following the standard protocol without IV contrast. RADIATION DOSE REDUCTION: This exam was performed according to the departmental dose-optimization program which includes automated exposure control, adjustment of  the mA and/or kV according to patient size and/or use of iterative reconstruction technique. COMPARISON:  CT scan chest, abdomen and pelvis from 07/22/2023. FINDINGS: CT CHEST FINDINGS Cardiovascular: Normal cardiac size. No pericardial effusion. No aortic aneurysm. There are coronary artery calcifications, in keeping with coronary artery disease. There are also moderate to severe peripheral atherosclerotic vascular calcifications of thoracic aorta and its major branches. There is apparent hypoattenuation of the blood pool relative to the myocardium, suggestive of anemia. Mediastinum/Nodes: Visualized thyroid gland appears grossly unremarkable. No solid / cystic mediastinal masses. The esophagus is nondistended precluding optimal assessment. There is moderate circumferential thickening of the lower thoracic esophagus, which is most likely seen in the settings of chronic gastroesophageal reflux disease versus esophagitis. No mediastinal or axillary lymphadenopathy by size criteria. Evaluation of bilateral hila is limited due to lack on intravenous contrast: however, no large hilar lymphadenopathy identified. Lungs/Pleura: The central tracheo-bronchial tree is patent. Since the prior study, there are innumerable new, solid noncalcified nodules throughout bilateral lungs with largest in the left lung apex measuring up to 8 x 10 mm and largest in the right lung apex measuring up to 6 x 7 mm, compatible with worsening metastases. There is also trace right pleural effusion with associated compressive atelectatic changes in the right lung lower lobe. No left pleural effusion. No consolidation or pneumothorax on either side. Musculoskeletal: A CT Port-a-Cath is seen in the right upper chest wall with the catheter terminating in the cavo-atrial junction region. Visualized soft tissues of the chest wall are otherwise grossly unremarkable. No suspicious osseous lesions. There are mild multilevel degenerative changes in the  visualized spine. CT ABDOMEN PELVIS FINDINGS Hepatobiliary: The liver is normal in size. There are innumerable heterogeneous hypoattenuating lesions throughout the liver occupying more than 50% of the total liver parenchyma, compatible with worsening hepatic metastases. 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 No intrahepatic or extrahepatic bile duct dilation. There are hyperattenuating areas within the gallbladder, which may represent gallbladder sludge. No imaging evidence of acute cholecystitis. Pancreas: No discrete focal pancreatic lesions seen within the limitations of this unenhanced exam. No peripancreatic fat stranding. Spleen: Within normal limits. No focal lesion. Adrenals/Urinary Tract: Adrenal glands are unremarkable. No suspicious renal mass within the limitations of this unenhanced exam. No hydroureteronephrosis or nephroureterolithiasis. Urinary bladder is under distended, precluding optimal assessment. However, no large mass or stones identified. No perivesical fat stranding. Stomach/Bowel: No disproportionate dilation of the small or large bowel loops. Redemonstration of heterogeneous hyperattenuating mass centered in the cecum. Vascular/Lymphatic: There is large ascites, significantly increased since the prior study. There are irregular soft tissue nodules throughout the abdomen, which may be due to ascites or due to peritoneal carcinomatosis. No pneumoperitoneum. Redemonstration of multiple prominent para-aortic lymph nodes, grossly similar to the prior study. No aneurysmal dilation of the major abdominal arteries. There are moderate peripheral atherosclerotic vascular calcifications of the aorta and its major branches. Reproductive: The uterus is unremarkable. No large adnexal mass. Other: The visualized soft tissues and abdominal wall are unremarkable. Musculoskeletal: No suspicious osseous lesions. There are mild - moderate multilevel degenerative changes in the visualized  spine.  IMPRESSION: 1. Since the prior study, there is interval worsening of metastatic disease with innumerable new pulmonary nodules and innumerable hepatic lesions. There is also interval increase in the ascites. 2. There is new, trace right pleural effusion with associated compressive atelectatic changes in the right lung lower lobe. 3. Multiple other nonacute observations, as described above. Aortic Atherosclerosis (ICD10-I70.0). Electronically Signed   By: Jules Schick M.D.   On: 11/06/2023 11:33   US Paracentesis Result Date: 10/23/2023 INDICATION: Patient with history of metastatic colon cancer, anemia, recurrent malignant ascites. Request received for therapeutic paracentesis. EXAM: ULTRASOUND GUIDED THERAPEUTIC PARACENTESIS MEDICATIONS: 8 ml 1 % lidocaine COMPLICATIONS: None immediate. PROCEDURE: Informed written consent was obtained from the patient after a discussion of the risks, benefits and alternatives to treatment. A timeout was performed prior to the initiation of the procedure. Initial ultrasound scanning demonstrates a moderate to large amount of ascites within the right lower abdominal quadrant. The right lower abdomen was prepped and draped in the usual sterile fashion. 1% lidocaine was used for local anesthesia. Following this, a 19 gauge, 7-cm, Yueh catheter was introduced. An ultrasound image was saved for documentation purposes. The paracentesis was performed. The catheter was removed and a dressing was applied. The patient tolerated the procedure well without immediate post procedural complication. FINDINGS: A total of approximately 3.1 liters of yellow fluid was removed. IMPRESSION: Successful ultrasound-guided therapeutic paracentesis yielding 3.1 liters of peritoneal fluid. Performed by: Artemio Aly Electronically Signed   By: Roanna Banning M.D.   On: 10/23/2023 16:32   IR Paracentesis Result Date: 10/17/2023 INDICATION: Patient with history of metastatic colon cancer,  anemia, ascites. Request received for diagnostic and therapeutic paracentesis. EXAM: ULTRASOUND GUIDED DIAGNOSTIC AND THERAPEUTIC PARACENTESIS MEDICATIONS: 8 mL 1% lidocaine COMPLICATIONS: None immediate. PROCEDURE: Informed written consent was obtained from the patient after a discussion of the risks, benefits and alternatives to treatment. A timeout was performed prior to the initiation of the procedure. Initial ultrasound scanning demonstrates a small to moderate amount of ascites within the left mid to lower abdominal quadrant. The left mid to lower abdomen was prepped and draped in the usual sterile fashion. 1% lidocaine was used for local anesthesia. Following this, a 19 gauge, 7-cm, Yueh catheter was introduced. An ultrasound image was saved for documentation purposes. The paracentesis was performed. The catheter was removed and a dressing was applied. The patient tolerated the procedure well without immediate post procedural complication. FINDINGS: A total of approximately 2.1 liters of yellow fluid was removed. Samples were sent to the laboratory as requested by the clinical team. IMPRESSION: Successful ultrasound-guided diagnostic and therapeutic paracentesis yielding 2.1 liters of peritoneal fluid. Performed by: Artemio Aly Electronically Signed   By: Malachy Moan M.D.   On: 10/17/2023 14:40    Time coordinating discharge: 50 mins  SIGNED:  Carollee Herter, DO [facility] 10/13/2023, 4:19 PM

## 2023-11-13 DEATH — deceased

## 2023-11-19 ENCOUNTER — Ambulatory Visit: Admitting: Nurse Practitioner

## 2023-11-19 ENCOUNTER — Other Ambulatory Visit

## 2023-11-19 ENCOUNTER — Ambulatory Visit
